# Patient Record
Sex: Male | Born: 1955 | Race: White | Hispanic: No | Marital: Married | Smoking: Former smoker
Health system: Southern US, Community
[De-identification: ages and names within clinical notes are randomized; demographics above are authoritative.]

## PROBLEM LIST (undated history)

## (undated) DIAGNOSIS — Z9861 Coronary angioplasty status: Secondary | ICD-10-CM

## (undated) DIAGNOSIS — K429 Umbilical hernia without obstruction or gangrene: Secondary | ICD-10-CM

## (undated) DIAGNOSIS — I10 Essential (primary) hypertension: Secondary | ICD-10-CM

## (undated) DIAGNOSIS — F1010 Alcohol abuse, uncomplicated: Secondary | ICD-10-CM

## (undated) DIAGNOSIS — I255 Ischemic cardiomyopathy: Secondary | ICD-10-CM

## (undated) DIAGNOSIS — Z923 Personal history of irradiation: Secondary | ICD-10-CM

## (undated) DIAGNOSIS — E669 Obesity, unspecified: Secondary | ICD-10-CM

## (undated) DIAGNOSIS — J449 Chronic obstructive pulmonary disease, unspecified: Secondary | ICD-10-CM

## (undated) DIAGNOSIS — C029 Malignant neoplasm of tongue, unspecified: Secondary | ICD-10-CM

## (undated) DIAGNOSIS — Z720 Tobacco use: Secondary | ICD-10-CM

## (undated) DIAGNOSIS — F419 Anxiety disorder, unspecified: Secondary | ICD-10-CM

## (undated) DIAGNOSIS — G8929 Other chronic pain: Secondary | ICD-10-CM

## (undated) DIAGNOSIS — I2102 ST elevation (STEMI) myocardial infarction involving left anterior descending coronary artery: Secondary | ICD-10-CM

## (undated) DIAGNOSIS — E785 Hyperlipidemia, unspecified: Secondary | ICD-10-CM

## (undated) DIAGNOSIS — C3490 Malignant neoplasm of unspecified part of unspecified bronchus or lung: Secondary | ICD-10-CM

## (undated) DIAGNOSIS — R51 Headache: Secondary | ICD-10-CM

## (undated) DIAGNOSIS — I2510 Atherosclerotic heart disease of native coronary artery without angina pectoris: Secondary | ICD-10-CM

## (undated) DIAGNOSIS — R5383 Other fatigue: Secondary | ICD-10-CM

## (undated) DIAGNOSIS — F32A Depression, unspecified: Secondary | ICD-10-CM

## (undated) DIAGNOSIS — J90 Pleural effusion, not elsewhere classified: Secondary | ICD-10-CM

## (undated) DIAGNOSIS — K219 Gastro-esophageal reflux disease without esophagitis: Secondary | ICD-10-CM

## (undated) DIAGNOSIS — F329 Major depressive disorder, single episode, unspecified: Secondary | ICD-10-CM

## (undated) HISTORY — DX: Essential (primary) hypertension: I10

## (undated) HISTORY — PX: LEFT HEART CATH AND CORONARY ANGIOGRAPHY: CATH118249

## (undated) HISTORY — DX: Alcohol abuse, uncomplicated: F10.10

## (undated) HISTORY — DX: Atherosclerotic heart disease of native coronary artery without angina pectoris: I25.10

## (undated) HISTORY — DX: Ischemic cardiomyopathy: I25.5

## (undated) HISTORY — DX: Hyperlipidemia, unspecified: E78.5

## (undated) HISTORY — DX: Gastro-esophageal reflux disease without esophagitis: K21.9

## (undated) HISTORY — PX: CORONARY ANGIOPLASTY WITH STENT PLACEMENT: SHX49

## (undated) HISTORY — DX: ST elevation (STEMI) myocardial infarction involving left anterior descending coronary artery: I21.02

## (undated) HISTORY — DX: Other fatigue: R53.83

## (undated) HISTORY — DX: Major depressive disorder, single episode, unspecified: F32.9

## (undated) HISTORY — DX: Chronic obstructive pulmonary disease, unspecified: J44.9

## (undated) HISTORY — PX: FRACTURE SURGERY: SHX138

## (undated) HISTORY — DX: Coronary angioplasty status: Z98.61

## (undated) HISTORY — DX: Depression, unspecified: F32.A

## (undated) HISTORY — DX: Obesity, unspecified: E66.9

---

## 2004-05-13 ENCOUNTER — Inpatient Hospital Stay (HOSPITAL_COMMUNITY): Admission: EM | Admit: 2004-05-13 | Discharge: 2004-05-15 | Payer: Self-pay | Admitting: Emergency Medicine

## 2004-05-13 ENCOUNTER — Encounter (INDEPENDENT_AMBULATORY_CARE_PROVIDER_SITE_OTHER): Payer: Self-pay | Admitting: Cardiovascular Disease

## 2006-01-02 ENCOUNTER — Ambulatory Visit: Payer: Self-pay | Admitting: Psychiatry

## 2006-01-02 ENCOUNTER — Inpatient Hospital Stay (HOSPITAL_COMMUNITY): Admission: RE | Admit: 2006-01-02 | Discharge: 2006-01-07 | Payer: Self-pay | Admitting: Psychiatry

## 2006-05-05 DIAGNOSIS — I2510 Atherosclerotic heart disease of native coronary artery without angina pectoris: Secondary | ICD-10-CM

## 2006-05-05 HISTORY — DX: Atherosclerotic heart disease of native coronary artery without angina pectoris: I25.10

## 2006-09-21 ENCOUNTER — Inpatient Hospital Stay (HOSPITAL_COMMUNITY): Admission: EM | Admit: 2006-09-21 | Discharge: 2006-09-24 | Payer: Self-pay | Admitting: Emergency Medicine

## 2006-10-19 ENCOUNTER — Ambulatory Visit (HOSPITAL_BASED_OUTPATIENT_CLINIC_OR_DEPARTMENT_OTHER): Admission: RE | Admit: 2006-10-19 | Discharge: 2006-10-19 | Payer: Self-pay | Admitting: Cardiology

## 2006-10-25 ENCOUNTER — Ambulatory Visit: Payer: Self-pay | Admitting: Internal Medicine

## 2007-06-11 ENCOUNTER — Inpatient Hospital Stay (HOSPITAL_COMMUNITY): Admission: EM | Admit: 2007-06-11 | Discharge: 2007-06-15 | Payer: Self-pay | Admitting: Cardiology

## 2007-09-02 ENCOUNTER — Encounter: Payer: Self-pay | Admitting: Cardiology

## 2007-09-13 ENCOUNTER — Encounter: Payer: Self-pay | Admitting: Cardiology

## 2009-09-19 ENCOUNTER — Telehealth (INDEPENDENT_AMBULATORY_CARE_PROVIDER_SITE_OTHER): Payer: Self-pay | Admitting: *Deleted

## 2009-09-26 ENCOUNTER — Ambulatory Visit: Payer: Self-pay | Admitting: Cardiology

## 2009-09-26 DIAGNOSIS — F17210 Nicotine dependence, cigarettes, uncomplicated: Secondary | ICD-10-CM

## 2009-09-26 DIAGNOSIS — E785 Hyperlipidemia, unspecified: Secondary | ICD-10-CM

## 2009-09-26 DIAGNOSIS — I2510 Atherosclerotic heart disease of native coronary artery without angina pectoris: Secondary | ICD-10-CM | POA: Insufficient documentation

## 2009-09-26 DIAGNOSIS — Z9861 Coronary angioplasty status: Secondary | ICD-10-CM

## 2009-09-26 DIAGNOSIS — R079 Chest pain, unspecified: Secondary | ICD-10-CM

## 2009-10-17 ENCOUNTER — Telehealth (INDEPENDENT_AMBULATORY_CARE_PROVIDER_SITE_OTHER): Payer: Self-pay | Admitting: *Deleted

## 2009-10-18 ENCOUNTER — Encounter: Payer: Self-pay | Admitting: Cardiovascular Disease

## 2009-10-18 ENCOUNTER — Ambulatory Visit (HOSPITAL_COMMUNITY): Admission: RE | Admit: 2009-10-18 | Discharge: 2009-10-18 | Payer: Self-pay | Admitting: Cardiology

## 2009-10-18 ENCOUNTER — Ambulatory Visit: Payer: Self-pay | Admitting: Cardiovascular Disease

## 2009-10-18 ENCOUNTER — Ambulatory Visit: Payer: Self-pay

## 2009-10-18 ENCOUNTER — Ambulatory Visit: Payer: Self-pay | Admitting: Cardiology

## 2009-10-18 ENCOUNTER — Encounter (HOSPITAL_COMMUNITY): Admission: RE | Admit: 2009-10-18 | Discharge: 2009-10-18 | Payer: Self-pay | Admitting: Cardiology

## 2009-10-19 LAB — CONVERTED CEMR LAB
Albumin: 4.3 g/dL (ref 3.5–5.2)
Bilirubin, Direct: 0.2 mg/dL (ref 0.0–0.3)
Calcium: 9.3 mg/dL (ref 8.4–10.5)
Chloride: 103 meq/L (ref 96–112)
Cholesterol: 217 mg/dL — ABNORMAL HIGH (ref 0–200)
GFR calc non Af Amer: 98.66 mL/min (ref >60)
Glucose, Bld: 92 mg/dL (ref 70–99)
HDL: 50.1 mg/dL (ref >39.00)
Potassium: 4.9 meq/L (ref 3.5–5.1)
Total CHOL/HDL Ratio: 4
Total Protein: 7.2 g/dL (ref 6.0–8.3)
Triglycerides: 77 mg/dL (ref 0.0–149.0)

## 2009-10-23 ENCOUNTER — Encounter (INDEPENDENT_AMBULATORY_CARE_PROVIDER_SITE_OTHER): Payer: Self-pay | Admitting: *Deleted

## 2009-11-06 ENCOUNTER — Ambulatory Visit: Payer: Self-pay | Admitting: Cardiology

## 2009-11-06 DIAGNOSIS — I255 Ischemic cardiomyopathy: Secondary | ICD-10-CM

## 2009-11-07 ENCOUNTER — Ambulatory Visit (HOSPITAL_COMMUNITY): Admission: RE | Admit: 2009-11-07 | Discharge: 2009-11-07 | Payer: Self-pay | Admitting: Cardiology

## 2009-11-07 ENCOUNTER — Ambulatory Visit: Payer: Self-pay | Admitting: Cardiology

## 2009-11-08 LAB — CONVERTED CEMR LAB
BUN: 13 mg/dL
CO2: 25 meq/L
Calcium: 9.2 mg/dL
Chloride: 106 meq/L
Creatinine, Ser: 1 mg/dL
GFR calc non Af Amer: 87.93 mL/min
Glucose, Bld: 63 mg/dL — ABNORMAL LOW
Potassium: 4.2 meq/L
Pro B Natriuretic peptide (BNP): 74.1 pg/mL
Sodium: 139 meq/L

## 2009-11-13 ENCOUNTER — Telehealth: Payer: Self-pay | Admitting: Cardiology

## 2009-11-26 ENCOUNTER — Ambulatory Visit: Payer: Self-pay | Admitting: Cardiology

## 2009-11-30 LAB — CONVERTED CEMR LAB
AST: 22 units/L (ref 0–37)
Albumin: 4.2 g/dL (ref 3.5–5.2)
Alkaline Phosphatase: 93 units/L (ref 39–117)
BUN: 15 mg/dL (ref 6–23)
CO2: 28 meq/L (ref 19–32)
Calcium: 9.2 mg/dL (ref 8.4–10.5)
Chloride: 100 meq/L (ref 96–112)
Cholesterol: 146 mg/dL (ref 0–200)
Creatinine, Ser: 1 mg/dL (ref 0.4–1.5)
Glucose, Bld: 96 mg/dL (ref 70–99)
Potassium: 4.4 meq/L (ref 3.5–5.1)
Sodium: 135 meq/L (ref 135–145)
Total Bilirubin: 0.6 mg/dL (ref 0.3–1.2)
Total CHOL/HDL Ratio: 3
Total Protein: 7.5 g/dL (ref 6.0–8.3)

## 2010-03-06 ENCOUNTER — Encounter: Payer: Self-pay | Admitting: Cardiology

## 2010-03-06 ENCOUNTER — Ambulatory Visit: Payer: Self-pay | Admitting: Cardiology

## 2010-03-22 ENCOUNTER — Ambulatory Visit: Payer: Self-pay | Admitting: Cardiology

## 2010-05-08 ENCOUNTER — Other Ambulatory Visit: Payer: Self-pay | Admitting: Cardiology

## 2010-05-08 ENCOUNTER — Ambulatory Visit
Admission: RE | Admit: 2010-05-08 | Discharge: 2010-05-08 | Payer: Self-pay | Source: Home / Self Care | Attending: Cardiology | Admitting: Cardiology

## 2010-05-08 LAB — HEPATIC FUNCTION PANEL
ALT: 17 U/L (ref 0–53)
AST: 19 U/L (ref 0–37)
Albumin: 3.6 g/dL (ref 3.5–5.2)
Alkaline Phosphatase: 96 U/L (ref 39–117)
Bilirubin, Direct: 0.2 mg/dL (ref 0.0–0.3)
Total Bilirubin: 0.6 mg/dL (ref 0.3–1.2)
Total Protein: 7 g/dL (ref 6.0–8.3)

## 2010-05-08 LAB — LIPID PANEL
Cholesterol: 150 mg/dL (ref 0–200)
HDL: 44.8 mg/dL (ref >39.00)
LDL Cholesterol: 95 mg/dL (ref 0–99)
Total CHOL/HDL Ratio: 3
Triglycerides: 52 mg/dL (ref 0.0–149.0)
VLDL: 10.4 mg/dL (ref 0.0–40.0)

## 2010-06-04 NOTE — Letter (Signed)
Summary: Appointment - Cardiac MRI  [facility]     [place], [place]    Phone:   Fax:       October 23, 2009 MRN: [id number]   Trevor Griffin [address] [place], [place]  [address]   Dear Trevor Griffin,   We have scheduled the above patient for an appointment for a Cardiac MRI on 11-07-2009 at 1:00p.m.  Please refer to the below information for the location and instructions for this test:  Location:     [facility]       [address]       [place], [place]  [address] Instructions:    Wilmon Arms at [facility] Outpatient Registration 45 minutes prior to your appointment time.  This will ensure you are in the Radiology Department 30 minutes prior to your appointment.    There are no restrictions for this test you may eat and take medications as usual.  If you need to reschedule this appointment please call at the number listed above.   Sincerely,      Lorne Skeens  [facility] Scheduling Team

## 2010-06-04 NOTE — Progress Notes (Signed)
  Phone Note Other Incoming   Request: Send information Summary of Call: Records received from [facility]. Called ext [phone] but no answer. Records forwarded to [facility] for Dr. Shirlee Latch to review.

## 2010-06-04 NOTE — Letter (Signed)
Summary: [facility] Office Note  [facility] Office Note   Imported By: Roderic Ovens 10/12/2009 11:39:37  _____________________________________________________________________  External Attachment:    Type:   Image     Comment:   External Document

## 2010-06-04 NOTE — Assessment & Plan Note (Signed)
Summary: lipid/liver/414.01 272.4   Visit Type:  Follow-up Primary Provider:  None  CC:  Patient never fill Lisinopril prescription.  History of Present Illness: 55 yo with history of CAD s/p anterior STEMI in 5/08 with placement of 4 Taxus stents in the proximal LAD.  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  No further hospitalizations since that time.  He is working full time at [employer], doing a lot of lifting and walking.  He feels like he has been getting tired/fatigued more easily over the last few months.  He does not get short of breath but he does get exhausted more easily than in the past.  He has had occasional episodes of atypical chest pain.  At last appointment, I restarted Plavix and put him on bisoprolol and Lipitor.  He was supposed to start lisinopril but did not.  He has been cutting back on cigarettes but is still smoking.   Myoview showed a large anteroapical infarct with no ischemia.  EF was 37%.  Echo showed EF 40% with septal/apical akinesis and inferior hypokinesis.    Labs (6/11): LDL 148  Current Medications (verified): 1)  Bisoprolol Fumarate 5 Mg Tabs (Bisoprolol Fumarate) .... One Tablet Daily 2)  Plavix 75 Mg Tabs (Clopidogrel Bisulfate) .... Once Daily 3)  Aspirin 81 Mg Tabs (Aspirin) .... Once Daily 4)  Lipitor 40 Mg Tabs (Atorvastatin Calcium) .... One Tablet Daily 5)  Celexa 20 Mg Tabs (Citalopram Hydrobromide) .... One Tablet Daily 6)  Nitrostat 0.4 Mg Subl (Nitroglycerin) .... One Under Your Tongue As Needed For Chest Pain  Allergies (verified): No Known Drug Allergies  Past History:  Past Medical History: 1. Hypertension 2. Gastroesophageal reflux disease 3. COPD: Active smoker, 1-1.5 ppd 4. CAD: Anterior STEMI 5/08 with 4 overlapping Taxus stents in the proximal to mid LAD.  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  Cath in  2/09 also showed 80% mid to distal CFX stenosis.  Myoview (6/11): EF 37%, large anteroapical infarct with no ischemia.  5. LV systolic dysfunction: Echo (4/09) with EF 40-50%, Mild global hypokinesis, mild LVH.  Echo (6/11): EF 40%, septal/apical akinesis, inferior hypokinesis, mild LVH.  6. Sleep study 6/08 was negative.   Family History: Reviewed history from 09/26/2009 and no changes required. CAD  Social History: Reviewed history from 09/26/2009 and no changes required. Married, lives in [place].  Works full time at [employer].  Drinks about a 6-pack daily.  Smokes 1-1.5 ppd. No kids.   Review of Systems       All systems reviewed and negative except as per HPI.   Vital Signs:  Patient profile:   55 year old male Height:      68.5 inches Weight:      181.25 pounds BMI:     27.26 Pulse rate:   68 / minute Pulse rhythm:   regular Resp:     18 per minute BP sitting:   106 / 70  (left arm) Cuff size:   large  Vitals Entered By: Vikki Ports (November 06, 2009 2:46 PM)  Physical Exam  General:  Well developed, well nourished, in no acute distress. Neck:  Neck supple, JVP 7-8 cm. No masses, thyromegaly or abnormal cervical nodes. Lungs:  Distant heart sounds.  Heart:  Non-displaced PMI, chest non-tender; somewhat distant heart sounds with regular rate and rhythm, S1, S2 without  rubs, murmurs, gallops. Carotid upstroke normal, no bruit. Pedals normal pulses. No edema, no varicosities. Abdomen:  Bowel sounds positive; abdomen soft and non-tender without masses, organomegaly, or hernias noted. No hepatosplenomegaly. Extremities:  No clubbing or cyanosis. Neurologic:  Alert and oriented x 3. Psych:  Normal affect.   Impression & Recommendations:  Problem # 1:  CORONARY ATHEROSCLEROSIS NATIVE CORONARY ARTERY (ICD-414.01) Infarct with no ischemia on myoview.  Suspect that his atypical chest pain was noncardiac.  Continue bisoprolol, ASA, Plavix, statin.  Will continue Plavix  long-term given late stent thrombosis when off Plavix.    Problem # 2:  CARDIOMYOPATHY, ISCHEMIC (ICD-414.8) EF 37% by myoview and 40% by echo.  No significant volume overload and no significant dyspnea (symptoms limited to generalized fatigue).  Continue bisoprolol and start lisinopril 2.5 mg daily (did not start at last appointment) with BMET in 2 wks.  I am going to have him do a cardiac MRI to quantify EF since it is getting close to 35%, at which level I would recommend an ICD.     Problem # 3:  SMOKER (ICD-305.1) Counselled to quit.  Wants to try electronic cigarette.   Problem # 4:  HYPERCHOLESTEROLEMIA (ICD-272.0) Lipids/LFTs in 8/11.   Other Orders: TLB-BMP (Basic Metabolic Panel-BMET) (80048-METABOL) TLB-BNP (B-Natriuretic Peptide) (83880-BNPR)  Patient Instructions: 1)  Your physician has recommended you make the following change in your medication:  2)  Start  Lisinopril 2.5mg  daily 3)  Use electronic cigarrette to help you stop smoking. 4)  Lab today---BMP/BNP 414.01 5)  Lab in 2 weeks--BMP 414.01 Wednesday July 20. 6)  Your physician recommends that you schedule a follow-up appointment in: 3 months with Dr Shirlee Latch.  7)  Keep the appointment for the Cardiac MRI tomorrow.

## 2010-06-04 NOTE — Progress Notes (Signed)
Summary: Nuclear Pre-Procedure  Phone Note Outgoing Call Call back at [facility] Phone [phone]   Call placed by: Stanton Kidney, EMT-P,  October 17, 2009 2:56 PM Call placed to: Patient Action Taken: Phone Call Completed Summary of Call: Reviewed information on Myoview Information Sheet (see scanned document for further details).  Spoke with Patient.    Nuclear Med Background Indications for Stress Test: Evaluation for Ischemia, Stent Patency, PTCA Patency   History: Angioplasty, COPD, Echo, Heart Catheterization, Myocardial Infarction, Stents  History Comments: '08 Stents: LAD x2 '08 & '09 MI '09 Heart Cath: LAD Stent thrombosis, residual CFX Dz > Angioplasty-LAD '09 Echo: EF=40-50%  Symptoms: Chest Pain, Fatigue    Nuclear Pre-Procedure Cardiac Risk Factors: Lipids, Smoker Height (in): 68.5

## 2010-06-04 NOTE — Progress Notes (Signed)
Summary: b/p issueson 2 different meds  Phone Note Call from Patient Call back at Home Phone [phone]   Caller: Patient Reason for Call: Talk to Nurse Summary of Call: per pt calling. c/o blood pressure issues 96/59. on yesterday 87/59. also weakness, pt on 2 different b/p meds.  Initial call taken by: Lorne Skeens,  November 13, 2009 2:38 PM  Follow-up for Phone Call        [facility] Katina Dung, RN, BSN  November 13, 2009 3:13 PM   per pt calling back  Lorne Skeens  November 13, 2009 3:22 PM     New/Updated Medications: BISOPROLOL FUMARATE 5 MG TABS (BISOPROLOL FUMARATE) one tablet daily in the morning LISINOPRIL 2.5 MG TABS (LISINOPRIL) one daily in the evening   Current Medications (verified): 1)  Bisoprolol Fumarate 5 Mg Tabs (Bisoprolol Fumarate) .... One Tablet Daily in The Morning 2)  Plavix 75 Mg Tabs (Clopidogrel Bisulfate) .... Once Daily 3)  Aspirin 81 Mg Tabs (Aspirin) .... Once Daily 4)  Lipitor 40 Mg Tabs (Atorvastatin Calcium) .... One Tablet Daily 5)  Celexa 20 Mg Tabs (Citalopram Hydrobromide) .... One Tablet Daily 6)  Nitrostat 0.4 Mg Subl (Nitroglycerin) .... One Under Your Tongue As Needed For Chest Pain 7)  Lisinopril 2.5 Mg Tabs (Lisinopril) .... One Daily in The Evening  Allergies: No Known Drug Allergies pt states he is taking Lisinopril 2.5mg  and Bisoprolol 5mg  daily in the morning--his blood pressure has been 124/74 in the morning but his B/P drops to 90's/59-60 later in the day--reviewed with Dr Jacolyn Joaquin--pt will take Bisoprolol in the morning and Lisinopril in the evening--pt will continue to monitor B/P and call if it continues in the 90's/59-60 range

## 2010-06-04 NOTE — Assessment & Plan Note (Signed)
Summary: np6/previous pt of Dr Gamble/jml   Primary Provider:  None  CC:  new patient/patient of Dr. Reino Kent.  .  History of Present Illness: 55 yo with history of CAD s/p anterior STEMI in 5/08 with placement of 4 Taxus stents in the proximal LAD.  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  No further hospitalizations since that time.  EF in 4/09 was mildly decreased (40-50% by echo).  Patient continues to smoke relatively heavily.  Chantix has worked to help him stop in the past.  He is working full time at [employer], doing a lot of lifting and walking.  He feels like he has been getting tired/fatigued more easily over the last few months.  He does not get short of breath but he does get exhausted more easily than in the past.  He has occasional episodes of chest pain.  Last episode was about 5 days ago at work.  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  He had to stop and rest and the pain went away.  He has not had any since then.  He is out of most of his medications and is sharing his wife's Plavix.  He has not taken metoprolol, lisinopril, his statin, or Lexapro for a few weeks now.    ECG: NSR, LAE, old ASMI  Current Medications (verified): 1)  Lopressor 50 Mg Tabs (Metoprolol Tartrate) .... 1/2 Two Times A Day 2)  Plavix 75 Mg Tabs (Clopidogrel Bisulfate) .... Once Daily 3)  Aspirin 81 Mg Tabs (Aspirin) .... Once Daily  Allergies (verified): No Known Drug Allergies  Past History:  Past Medical History: 1. Hypertension 2. Gastroesophageal reflux disease 3. COPD: Active smoker, 1-1.5 ppd 4. CAD: Anterior STEMI 5/08 with 4 overlapping Taxus stents in the proximal to mid LAD.  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  Cath in 2/09 also showed 80% mid to distal CFX  stenosis.  5. LV systolic dysfunction: Echo (4/09) with EF 40-50%, Mild global hypokinesis, mild LVH 6. Sleep study 6/08 was negative.   Family History: CAD  Social History: Married, lives in [place].  Works full time at [employer].  Drinks about a 6-pack daily.  Smokes 1-1.5 ppd. No kids.   Review of Systems       All systems reviewed and negative except as per HPI.   Vital Signs:  Patient profile:   55 year old male Height:      68.5 inches Weight:      183 pounds BMI:     27.52 Pulse rate:   71 / minute Pulse rhythm:   regular BP sitting:   128 / 80  (left arm) Cuff size:   regular  Vitals Entered By: Judithe Modest CMA (Sep 26, 2009 11:01 AM)  Physical Exam  General:  Well developed, well nourished, in no acute distress. Head:  normocephalic and atraumatic Nose:  no deformity, discharge, inflammation, or lesions Mouth:  Teeth, gums and palate normal. Oral mucosa normal. Neck:  Neck supple, no JVD. No masses, thyromegaly or abnormal cervical nodes. Lungs:  Distant breath sounds bilaterally.  Heart:  Non-displaced PMI, chest non-tender; somewhat distant heart sounds with regular rate and rhythm, S1, S2 without rubs or gallops. 1/6 HSM at apex.  Carotid upstroke normal, no  bruit. Pedals normal pulses. No edema, no varicosities. Abdomen:  Bowel sounds positive; abdomen soft and non-tender without masses, organomegaly, or hernias noted. No hepatosplenomegaly. Msk:  Back normal, normal gait. Muscle strength and tone normal. Extremities:  No clubbing or cyanosis. Neurologic:  Alert and oriented x 3. Skin:  Multiple tatoos Psych:  Normal affect.   Impression & Recommendations:  Problem # 1:  CORONARY ATHEROSCLEROSIS NATIVE CORONARY ARTERY (ICD-414.01) Patient has had history of CAD s/p anterior MI x 2, the second was stent thrombosis when he stopped Plavix (over a year after his stents were put in ).  He has not been taking his meds regularly except for ASA 81 mg daily.   He is noting very easy fatiguability at work, and he had an episode of exertional chest pain about 5 days ago.   - Given his history, I think that he needs ETT-myoview to assess for significant ischemia.  - Restart beta blocker: bisoprolol 5 mg daily rather than metoprolol.  - Restart low dose ACEI - Restart Plavix, will look into getting him into the Plavix assistance program.  - Restart statin: Lipitor 40 mg daily   Problem # 2:  HYPERCHOLESTEROLEMIA (ICD-272.0) Will check lipids/LFTs today.  Will restart statin (Lipitor 40 mg daily) with lipids/LFTs in 2 months.   Problem # 3:  ISCHEMIC CARDIOMYOPATHY Mildly decreased LV systolic function on 2009 echo.  No significant volume overload on exam.  Will start bisoprolol 5 mg daily and lisinopril 2.5 mg daily. Will get echocardiogram.   Problem # 4:  DEPRESSION I will restart the patient on an antidepressant, Celexa 20 mg daily.   Problem # 5:  SMOKER (ICD-305.1) Patient needs to quit.  I strongly advised him to do so.  I will give him a prescription for Chantix (worked in the past).  If he cannot afford this, will also give prescription for electronic cigarette.  I referred the patient to primary care.   Other Orders: Primary Care Referral (Primary) Echocardiogram (Echo) Nuclear Stress Test (Nuc Stress Test)  Patient Instructions: 1)  Your physician has recommended you make the following change in your medication:  2)    3)  Stop Metoprolol(lopressor) 4)  Start Bisoprolol 5mg  daily 5)  Take Aspirin 81mg  daily--this should be buffered or coated 6)  Stop Simvastatin 7)  Start Lipitor 40mg  daily 8)  Start Lisinopril 2.5mg  daily 9)  Take Plavix 75mg  daily 10)  Start Celexa 20mg  daily 11)  Start Chantix or use the electronic cigarrette to help you stop smoking---YOU SHOULD NOT USE BOTH 12)  Your physician recommends that you return for a FASTING lipid profile/liver profile/BMP---- ASAP AND in 2 months--414.01 272.0 786.50-you can have  this when you get other testing done 13)  Your physician has requested that you have an echocardiogram.  Echocardiography is a painless test that uses sound waves to create images of your heart. It provides your doctor with information about the size and shape of your heart and how well your heart's chambers and valves are working.  This procedure takes approximately one hour. There are no restrictions for this procedure. 14)  Your physician has requested that you have an exercise stress myoview.  For further information please visit [email or web address]  Please follow instruction sheet, as given. 15)  Your physician recommends that you schedule a follow-up appointment in: 3 weeks with Dr Shirlee Latch. 16)  You have been referred to Primary Care on [facility] 17)  Your physician recommended you take 1 tablet under  tongue at onset of chest pain; you may repeat every 5 minutes for up to 3 doses. If 3 or more doses are required, call 911 and proceed to the ER immediately. Prescriptions: CHANTIX CONTINUING MONTH PAK 1 MG TABS (VARENICLINE TARTRATE) take as directed  #1 x 1   Entered by:   Katina Dung, RN, BSN   Authorized by:   [name], MD   Signed by:   Katina Dung, RN, BSN on 09/26/2009   Method used:   Electronically to        [facility]  [address] [id number] (retail)       [address]       [place], [place]  [address]       Ph: [phone] or [phone]       Fax: [phone]   RxID:   [id number] CHANTIX STARTING MONTH PAK 0.5 MG X 11 & 1 MG X 42 TABS (VARENICLINE TARTRATE) take as directed  #1 x 0   Entered by:   Katina Dung, RN, BSN   Authorized by:   [name], MD   Signed by:   Katina Dung, RN, BSN on 09/26/2009   Method used:   Electronically to        [facility]  [address] [id number] (retail)       [address]       [place], [place]  [address]       Ph: [phone] or [phone]       Fax: [phone]   RxID:    [id number] NITROSTAT 0.4 MG SUBL (NITROGLYCERIN) one under your tongue as needed for chest pain  #100 x 11   Entered by:   Katina Dung, RN, BSN   Authorized by:   [name], MD   Signed by:   Katina Dung, RN, BSN on 09/26/2009   Method used:   Electronically to        [facility]  [address] [id number] ([facility])       [address]       [place], [place]  [address]       Ph: [phone] or [phone]       Fax: [phone]   RxID:   [id number] CELEXA 20 MG TABS (CITALOPRAM HYDROBROMIDE) one tablet daily  #30 x 1   Entered by:   Katina Dung, RN, BSN   Authorized by:   [name], MD   Signed by:   Katina Dung, RN, BSN on 09/26/2009   Method used:   Electronically to        [facility]  [address] [id number] ([facility])       [address]       [place], [place]  [address]       Ph: [phone] or [phone]       Fax: [phone]   RxID:   [id number] LISINOPRIL 2.5 MG TABS (LISINOPRIL) one tablet daily  #30 x 11   Entered by:   Katina Dung, RN, BSN   Authorized by:   [name], MD   Signed by:   Katina Dung, RN, BSN on 09/26/2009   Method used:   Electronically to        [facility]  [address] [facility]       [address]       [facility]  [place], [place]  [address]       Ph: [phone] or [phone]       Fax: [phone]   RxID:   [id number] BISOPROLOL FUMARATE 5 MG TABS (BISOPROLOL FUMARATE) one tablet daily  #30 x 11   Entered by:   Katina Dung, RN, BSN   Authorized by:   [name], MD   Signed by:   Katina Dung, RN, BSN on 09/26/2009   Method used:   Electronically to        [facility]  [address] [id number] ([facility])       [address]       [place], [place]  [address]       Ph: [phone] or [phone]       Fax: [phone]   RxID:   [id number] LIPITOR 40 MG TABS (ATORVASTATIN CALCIUM) one daily  #30 x 2   Entered by:   Katina Dung, RN, BSN   Authorized by:    [name], MD   Signed by:   Katina Dung, RN, BSN on 09/26/2009   Method used:   Electronically to        [facility]  [address] [facility] ([facility])       [address]       [place], [place]  [address]       Ph: [phone] or [phone]       Fax: [phone]   RxID:   [id number] PLAVIX 75 MG TABS (CLOPIDOGREL BISULFATE) once daily  #30 x 11   Entered by:   Katina Dung, RN, BSN   Authorized by:   [name], MD   Signed by:   Katina Dung, RN, BSN on 09/26/2009   Method used:   Electronically to        [facility]  [address] [facility] ([facility])       [address]       [place], [place]  [address]       Ph: [phone] or [phone]       Fax: [phone]   RxID:   [id number]

## 2010-06-04 NOTE — Assessment & Plan Note (Signed)
Summary: 3 month rov.sl   Visit Type:  Follow-up Primary Provider:  None  CC:  no complaints.  History of Present Illness: 55 yo with history of CAD s/p anterior STEMI in 5/08 with placement of 4 Taxus stents in the proximal LAD.  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  EF was borderline for ICD by echo and myoview, so cardiac MRI was done to quantify LV function.  EF was found to be 39%.  He is working full time at [employer], doing a lot of lifting and walking.   He is doing well in general, no chest pain and no dyspnea with exertion.  BP at home is usually running in the 120s/80s.  However, at times he will feel fatigued and check his BP, and systolic can be down to the 80s and 90s.  Normally he does not have significant fatigue.  No myalgias.  He has been on simvastatin 80 mg for well over a year.    Labs (6/11): LDL 148 Labs (7/11): HDL 47, LDL 73, K 4.4, creatinine 1.0, BNP 74  ECG: NSR, old ASMI  Current Medications (verified): 1)  Bisoprolol Fumarate 5 Mg Tabs (Bisoprolol Fumarate) .... One Tablet Daily in The Morning 2)  Plavix 75 Mg Tabs (Clopidogrel Bisulfate) .... Once Daily 3)  Aspirin 81 Mg Tabs (Aspirin) .... Once Daily 4)  Simvastatin 80 Mg Tabs (Simvastatin) .Marland Kitchen.. 1 Tab At Bedtime 5)  Celexa 20 Mg Tabs (Citalopram Hydrobromide) .... One Tablet Daily 6)  Nitrostat 0.4 Mg Subl (Nitroglycerin) .... One Under Your Tongue As Needed For Chest Pain 7)  Lisinopril 2.5 Mg Tabs (Lisinopril) .... One Daily in The Evening  Allergies (verified): No Known Drug Allergies  Past History:  Past Medical History: 1. Hypertension 2. Gastroesophageal reflux disease 3. COPD: Active smoker, 1-1.5 ppd 4. CAD: Anterior STEMI 5/08 with 4 overlapping Taxus stents in the proximal to mid LAD.  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  Cath in 2/09 also showed 80% mid to distal  CFX stenosis.  Myoview (6/11): EF 37%, large anteroapical infarct with no ischemia.  5. LV systolic dysfunction: Echo (4/09) with EF 40-50%, Mild global hypokinesis, mild LVH.  Echo (6/11): EF 40%, septal/apical akinesis, inferior hypokinesis, mild LVH.   Cardiac MRI (7/11): EF 39%, anteroseptal and mid to apical anterior akinesis, scar in the anteroseptal and the mid to apical anterior wall (likely not viable).  6. Sleep study 6/08 was negative.  7. Hyperlipidemia  Family History: Reviewed history from 09/26/2009 and no changes required. CAD  Social History: Reviewed history from 09/26/2009 and no changes required. Married, lives in [place].  Works full time at [employer].  Drinks about a 6-pack daily.  Smokes 1-1.5 ppd. No kids.   Review of Systems       All systems reviewed and negative except as per HPI.   Vital Signs:  Patient profile:   55 year old male Height:      68.5 inches Weight:      183 pounds BMI:     27.52 Pulse rate:   73 / minute BP sitting:   148 / 88  (left arm) Cuff size:   regular  Vitals Entered By: Hardin Negus, RMA (March 06, 2010 8:55 AM)  Physical Exam  General:  Well developed, well nourished, in no acute distress. Neck:  Neck supple, JVP  7 cm. No masses, thyromegaly or abnormal cervical nodes. Lungs:  Mildly decreased breath sounds bilaterally.  Heart:  Non-displaced PMI, chest non-tender; somewhat distant heart sounds with regular rate and rhythm, S1, S2 without rubs, murmurs, gallops. Carotid upstroke normal, no bruit. Pedals normal pulses. No edema, no varicosities. Abdomen:  Bowel sounds positive; abdomen soft and non-tender without masses, organomegaly, or hernias noted. No hepatosplenomegaly. Extremities:  No clubbing or cyanosis. Neurologic:  Alert and oriented x 3. Psych:  Normal affect.   Impression & Recommendations:  Problem # 1:  CARDIOMYOPATHY, ISCHEMIC (ICD-414.8) EF 37% by myoview, 40% by echo, and 39% by MRI.  Will hold  off on ICD for now but will continue to follow function over time.  No significant volume overload and no significant dyspnea.  NYHA class II.  Continue bisoprolol and lisinopril 2.5 mg daily.  I am not going to increase either right now given his episodes of occasional symptomatic hypotension.  I will start him on spironolactone 12.5 mg daily with BMET in 2 wks.   Problem # 2:  SMOKER (ICD-305.1) Counselled to quit.  He is working on it.   Problem # 3:  CORONARY ATHEROSCLEROSIS NATIVE CORONARY ARTERY (ICD-414.01) Infarct with no ischemia on most recent myoview.  Cardiac MRI showed scar pattern in the anterior and anteroseptal walls that suggest viability is unlikely. Continue bisoprolol, ASA, Plavix, statin.  Will continue Plavix long-term given late stent thrombosis when off Plavix.    Problem # 4:  HYPERCHOLESTEROLEMIA (ICD-272.0) Patient has been on simvastatin 80 mg without problems, but as Lipitor is going generic I think this will be more safe.  He will start atorvastatin 40 mg daily when he runs out of simvastatin.   Patient Instructions: 1)  Your physician has recommended you make the following change in your medication:  2)  Start Spironolactone 12.5mg  daily--this will be one-half of a 25mg  tablet. 3)  Your physician recommends that you return for lab work in: 2 weeks---BMP  414.01  428.22 4)  Your physician recommends that you return for a FASTING lipid profile/liver profile in [place] 2012--414.01  428.22 5)  Your physician recommends that you schedule a follow-up appointment in: 4 months with Dr Shirlee Latch. Prescriptions: SPIRONOLACTONE 25 MG TABS (SPIRONOLACTONE) one-half daily  #45 x 3   Entered by:   Katina Dung, RN, BSN   Authorized by:   [name], MD   Signed by:   Katina Dung, RN, BSN on 03/06/2010   Method used:   Electronically to        [facility] [id number] ([facility])       [address]       [place], [place]  [address]       Ph:  [phone] or [phone]       Fax: [phone]   RxID:   [id number]

## 2010-06-04 NOTE — Assessment & Plan Note (Signed)
Summary: Cardiology Nuclear Study  Nuclear Med Background Indications for Stress Test: Evaluation for Ischemia, Stent Patency, PTCA Patency   History: Angioplasty, COPD, Echo, Heart Catheterization, Myocardial Infarction, Stents  History Comments: '08 Stents: LAD x2 '08 & '09 MI '09 Heart Cath: LAD Stent thrombosis, residual CFX Dz > Angioplasty-LAD '09 Echo: EF=40-50%  Symptoms: Chest Pain, DOE, Fatigue    Nuclear Pre-Procedure Cardiac Risk Factors: Lipids, Smoker Caffeine/Decaff Intake: None NPO After: 5:30 PM Lungs: clear IV 0.9% NS with Angio Cath: 18g     IV Site: (R) AC IV Started by: Stanton Kidney EMT-P Chest Size (in) 44     Height (in): 68.5 Weight (lb): 181 BMI: 27.22 Tech Comments: Bisoprolol held > 24 hours, per patient.  Nuclear Med Study 1 or 2 day study:  1 day     Stress Test Type:  Stress Reading MD:  Charlton Haws, MD     Referring MD:  Mclean Resting Radionuclide:  Technetium 36m Tetrofosmin     Resting Radionuclide Dose:  11 mCi  Stress Radionuclide:  Technetium 62m Tetrofosmin     Stress Radionuclide Dose:  33 mCi   Stress Protocol Exercise Time (min):  7:30 min     Max HR:  160 bpm Max Systolic BP: 186 mm Hg     METS: 9.30 Rate Pressure Product:  59563    Stress Test Technologist:  Frederick Peers EMT-P     Nuclear Technologist:  Domenic Polite CNMT  Rest Procedure  Myocardial perfusion imaging was performed at rest 45 minutes following the intravenous administration of Myoview Technetium 9m Tetrofosmin.  Stress Procedure  The patient exercised for 7:44mins.  The patient stopped due to SOB/fatigue  and denied any chest pain.  There were non specific ST-T wave changes.  Myoview was injected at peak exercise and myocardial perfusion imaging was performed after a brief delay.  QPS Raw Data Images:  Normal; no motion artifact; normal heart/lung ratio. Stress Images:  anteriro MI Rest Images:  anteriro MI Subtraction (SDS):  SDS 3 Transient  Ischemic Dilatation:  1.01  (Normal <1.22)  Lung/Heart Ratio:  .38  (Normal <0.45)  Quantitative Gated Spect Images QGS EDV:  150 ml QGS ESV:  94 ml QGS EF:  37 % QGS cine images:  Anteroapical hypokinesis  Findings Low risk nuclear study  Evidence for anterior (septal apical) infarct     Overall Impression  Exercise Capacity: Fair exercise capacity. BP Response: Normal blood pressure response. Clinical Symptoms: Dyspnea and Fatigue ECG Impression: No significant ST segment change suggestive of ischemia. Overall Impression: Large anteroapical infarct with no ishcemia.  ? further risk stratification regarding AICD  Appended Document: Cardiology Nuclear Study Old anterior MI, no ischemia.   Appended Document: Cardiology Nuclear Study discussed results with wife 10/19/09

## 2010-07-15 ENCOUNTER — Other Ambulatory Visit: Payer: Managed Care, Other (non HMO)

## 2010-07-17 ENCOUNTER — Ambulatory Visit: Payer: Self-pay | Admitting: Cardiology

## 2010-08-20 ENCOUNTER — Encounter: Payer: Self-pay | Admitting: Cardiology

## 2010-09-17 NOTE — Discharge Summary (Signed)
NAME:  Trevor Griffin, Trevor Griffin NO.:  [id number]   MEDICAL RECORD NO.:  [id number]          PATIENT TYPE:  INP   LOCATION:  2023                         FACILITY:  [facility]   PHYSICIAN:  Madaline Savage, M.D.DATE OF BIRTH:  [birth year]   DATE OF ADMISSION:  09/21/2006  DATE OF DISCHARGE:  09/24/2006                               DISCHARGE SUMMARY   DISCHARGE DIAGNOSIS:  1. Acute ST-elevation anterior myocardial infarction.      a.     Res-Q cardiac cath with percutaneous transluminal coronary       angioplasty and drug-eluting Taxus stent placement to a 100% left       anterior descending stenosis in the proximal and mid left anterior       descending overlapping Taxus stent.      b.     Previous known coronary disease with 40-50% proximal and mid       left anterior descending disease.  2. Ischemic cardiomyopathy with stunned myocardium, ejection fraction      30%.      a.     We will need a 2D echo in 3 months to evaluate for       implantable cardiac defibrillator.  3. Hyperlipidemia with Statins currently on hold secondary to next      number.  4. Abnormal liver function tests, Statin on hold, will monitor as an      outpatient.  5. Positive tobacco use, Chantix started.  6. Chronic obstructive pulmonary disease.   DISCHARGE MEDICATIONS:  1. Mobic if needed for arthritic pain.  2. Tylenol if needed for pain.  3. Lexapro 10 mg daily.  4. Omeprazole/Prilosec 20 mg daily.  5. Lisinopril 5 mg 1 daily.  6. Lopressor 50 mg 1/2 tablet every 8 hours.  7. Plavix 75 mg daily.  Do not stop taking; it could cause a heart      attack.  8. Aspirin 81 mg daily.  9. Chantix 0.5 mg 1 daily on May 23 and then twice a day beginning May      24 for 4 days, then 1 mg twice a day.  10.Nitroglycerin as needed under the tongue for chest pain.   DISCHARGE INSTRUCTIONS:  1. May return to work in 6 weeks.  2. Increase activity slowly.  May walk under two flights of steps a       day.  3. May shower or bathe.  4. No lifting for 4 weeks and no driving for 2 weeks and restriction      of activity for 1 week.  5. Low-sodium heart-healthy diet.  6. Wash right groin cath site with soap and water.  To call for any      bleeding, swelling, or drainage.  7. Followup with Dr. Elsie Lincoln, Sep 29, 2006, at 10:45 a.m.  8. Stop __________.  9. Go our office on the 3rd floor today to pick up Plavix sample.   DISCHARGE CONDITION:  Improved.   PROCEDURES:  1. Emergent cardiac catheterization secondary to acute ST-elevated      myocardial infarction in his anterior leads.  2.  Sep 21, 2006, percutaneous transluminal coronary angioplasty and      stent employment, two Taxus stents overlapping the mid and proximal      left anterior descending.  Ejection fraction was found to be 30%.   HISTORY OF PRESENT ILLNESS:  This 55 year old white married male  patient, who had seen Dr. Alanda Amass over a year ago.  At that time had  cardiac catheterization and was found to have a 40-50% segmental  irregularities of the proximal left anterior descending and mid left  anterior descending.   He has not been followed since that time and was awakened on May 55 with  severe chest pain, short of breath, nausea, diaphoresis.  He took  sublingual nitroglycerin with some relief, called EMS, and was taken to  the [facility] Emergency Room with acute ST elevations in his  anterolateral leads to a significant degree.  He was taken immediately  to the cardiac cath lab, where he was found to have a 100% LAD stenosis,  underwent PTCA and stent employment with Taxus stents to that vessel.  EF was found to be 30%.  He stabilized and was admitted to [facility].   PAST MEDICAL HISTORY:  1. Hypertension.  2. Arthritis.  3. Gastroesophageal reflux disease.  4. Coronary disease as stated.   FAMILY HISTORY/SOCIAL HISTORY/REVIEW OF SYSTEMS:  See H&P.   ALLERGIES:  No known drug allergies.   OUTPATIENT  MEDICATIONS:  Were minimal and only on a p.r.n. basis except  for Lexapro that he was taking daily but otherwise hydrochlorothiazide  and Mobic as needed.   PHYSICAL EXAMINATION AT DISCHARGE:  VITAL SIGNS:  Blood pressure at  93/65, pulse 95, respirations 20, temperature 99, oxygen saturation 98%.  HEART:  Regular rate and rhythm.  No murmurs, gallops, or rubs.  Lungs were clear.  ABDOMEN:  Soft, nontender.  No masses.  EXTREMITIES:  Right groin cath site was stable without hematoma.  Trivial ecchymosis.   LABORATORY DATA:  Admitting laboratory:  Hemoglobin 15.8, hematocrit  45.7, WBC 8, platelets 213, neutrophils 52, lymphs 23, monos 13, eos 1,  basos 1.   Pro time was 12.4, INR was 0.9, PTT 23.   CHEMISTRY:  Sodium 134, potassium 4, chloride 102, glucose 144, BUN 10,  creatinine 0.86.  These remained essentially stable.  Potassium remained  stable.  Total protein 6.4, albumin 3.3, AST 433, ALT 16, ALP was 76,  total bili of 0.7, direct bili of 0.2, and indirect bili 0.5.   Glyco hemoglobin 5.4.  Cardiac enzymes:  Initial CK:  5342 with an MB of  702, troponin I greater than 100, followup gradually decreased and by  May 21, was 458 with an MB of 20.6 and relative index 4.5, troponin I of  34.34.   Total cholesterol 188, triglycerides 86, HDL 46, and LDL 125.  He will  be put on Statin as an outpatient.  It was held here because of elevated  LFTs.   RADIOLOGY:  No active disease.   EKG:  ST elevations in anterolateral leads, and reciprocal changes his  inferolateral leads.  These gradually improved.   HOSPITAL COURSE:  The patient was admitted emergently by Dr. Elsie Lincoln,  underwent emergent cardiac cath for his acute anterior wall ST-elevation  MI.  He tolerated the procedure well and was admitted to the ICU and was  started on Chantix for tobacco abuse.  EKG with evolving anterior MI. He was slowly improved over the next 2 to 3 days,  ambulating with  cardiac rehab, and by May  22, was stable and ready for discharge home.  He was seen and examined by Dr. Elsie Lincoln, who will follow up.      Darcella Gasman. Annie Paras, N.P.    ______________________________  Madaline Savage, M.D.    LRI/MEDQ  D:  09/24/2006  T:  09/24/2006  Job:  [id number]   cc:   Madaline Savage, M.D.  Richard A. Alanda Amass, M.D.

## 2010-09-17 NOTE — Cardiovascular Report (Signed)
NAME:  [name], [name]:  [id number]   MEDICAL RECORD NO.:  [id number]          PATIENT TYPE:  INP   LOCATION:  4705                         FACILITY:  [facility]   PHYSICIAN:  Cristy Hilts. Jacinto Halim, MD       DATE OF BIRTH:  Nov 04, 1955   DATE OF PROCEDURE:  06/11/2007  DATE OF DISCHARGE:                            CARDIAC CATHETERIZATION   PROCEDURE PERFORMED:  1. Left ventriculography.  2. Selective right and left coronary arteriography.  3. PTCA and balloon angioplasty of the thrombotic mid LAD stent.   INDICATION:  Trevor Griffin was brought into the [facility] cardiac  catheterization laboratory on an emergent basis via EMS.  A Code STEMI  was activated for an acute septal myocardial infarction.  He had ongoing  chest pain.  He has been taking Plavix about 5 out of 7 days and has  been irregular with his Plavix recently.  He continues to smoke.  He had  previously undergone PTCA and stenting of the proximal and entire mid  segment of the LAD with implantation of four DES stents that are 3.0 x  16 overlapped with 3.0 x 16, 3.0 x 24, and a 3.0 x 28-mm Taxus stents  performed on Sep 21, 2006.  He is now undergoing emergent cardiac  catheterization for an acute ST-elevation myocardial infarction.   HEMODYNAMIC DATA:  The left ventricular pressure was 94/3 with end  diastolic pressure of 22 mmHg.  Aortic pressure was 93/72 with a mean of  82 mmHg.  No significant pressure gradient across the aortic valve.   ANGIOGRAPHIC DATA:  Left ventricle:  Left ventricular systolic function  was preserved with an ejection fraction of 50% with mild global  hypokinesis.  There is no significant mitral regurgitation.   Right coronary artery:  Right coronary artery is a nondominant vessel  and is patent.   Left main coronary artery:  Left main coronary artery is a very large  caliber vessel and is very short.  It is smooth and normal.   Circumflex coronary artery:  Circumflex coronary  artery is a large  caliber vessel.  It is a dominant vessel.  Gives origin to a small high  OM-1 and a large OM-2.  Just after the origin of the OM-2 is a high-  grade 80% focal stenosis.  Just off the origin of the large obtuse  marginal two there is an 80% focal stenosis.  Followed by this, the  distal circumflex coronary artery gave origin to two PDA branches and a  PLA branch.  The branches and the circumflex otherwise were smooth and  normal and very large caliber vessels.   LAD:  The LAD is a very large caliber vessel.  There are four previously  placed stents that were noted in the proximal and mid LAD.  Outflow of  the stent has a 20-30% smooth stenosis which is unchanged from prior  cardiac catheterization.  The LAD wraps around the apex.  The stent  itself in the mid segment had a thrombotic 70% stenosis.   INTERVENTION DATA:  Successful PTCA and balloon  angioplasty of the  thrombotic stenosis in the mid LAD.  The 3 mm stents were dilated with a  3.5 x 12-mm Maverick throughout the entirety of the stents at 14  atmospheric pressure giving it a 3.8 mm lumen.  Post balloon angioplasty  angiography revealed excellent results without any residual filling  defects.  Prior to performing balloon angioplasty, Fetch aspiration  catheter was utilized for aspiration of the thrombus.  There was near-  complete resolution of his chest pain with the EKG back to baseline  after the procedure.   RECOMMENDATIONS:  The patient needs to be stressed regarding the  compliance with Plavix.  He will need cardiac markers to be followed up.  He will be observed in the hospital for a couple of days and will be  discharged home.  Smoking cessation is indicated.   A total of 180 mL of contrast was utilized for diagnostic and  interventional procedure.   TECHNIQUE OF THE PROCEDURE:  Under the usual sterile precautions using a  6-French right femoral arterial access, a 6-French multipurpose B2   catheter was advanced through the ascending aorta and then into the left  ventricle.  Left ventriculography was performed in the RAO projection.  The catheter then pulled into the ascending aorta and right coronary  artery was selectively engaged and angiography was performed.  The  catheter was then pulled out of body and a 6-French AL2 diagnostic  catheter was utilized to engage the left main coronary artery and  angiography was performed.   TECHNIQUE OF THE INTERVENTION:  Using heparin and Integrilin for  anticoagulation and using an AL3 guide to engage the left main coronary  artery, a 190 cm 0.014-inch ATW guidewire was advanced into the LAD.  Fetch aspiration catheter was utilized and multiple aspirations were  performed.  Having performed this, there was significant reduction in  the thrombus burden.  However, there was still residual thrombus.  Hence, I proceeded with balloon dilatation with a 3.5 x 12-mm Maverick  which was performed at 12 atmospheric pressure (rate of burst) with  reduction of stenosis from 70% to 0% with no residual thrombus at the  end of this procedure.  TIMI 3 flow was maintained pre and post  procedure.  Then the guidewire was withdrawn and guide catheter  disengaged and pulled out of the body.  The patient tolerated the  procedure well, no immediate complications noted.      Cristy Hilts. Jacinto Halim, MD  Electronically Signed     JRG/MEDQ  D:  06/11/2007  T:  06/13/2007  Job:  [id number]   cc:   Anselm Jungling, MD  Richard A. Alanda Amass, M.D.  [facility]

## 2010-09-17 NOTE — Discharge Summary (Signed)
NAME:  Trevor Griffin, Trevor Griffin NO.:  [id number]   MEDICAL RECORD NO.:  [id number]          PATIENT TYPE:  INP   LOCATION:  4705                         FACILITY:  [facility]   PHYSICIAN:  Abelino Derrick, P.A.   DATE OF BIRTH:  28-Mar-1956   DATE OF ADMISSION:  06/11/2007  DATE OF DISCHARGE:  06/15/2007                               DISCHARGE SUMMARY   Dr. Domingo Sep noted at discharge that the patient will probably require  an outpatient nuclear test to evaluate the residual disease of 80% in  the circumflex.  Will defer this to Dr. Elsie Griffin.      Abelino Derrick, P.ALenard Lance  D:  06/15/2007  T:  06/17/2007  Job:  [id number]

## 2010-09-17 NOTE — Procedures (Signed)
NAME:  Trevor Griffin, Trevor Griffin NO.:  [id number]   MEDICAL RECORD NO.:  [id number]          PATIENT TYPE:  OUT   LOCATION:  SLEEP CENTER                 FACILITY[facility]   PHYSICIAN:  Clinton D. Maple Hudson, MD, FCCP, FACPDATE OF BIRTH:  1956-04-26   DATE OF STUDY:  10/19/2006                            NOCTURNAL POLYSOMNOGRAM   REFERRING PHYSICIAN:  Madaline Savage, M.D.   INDICATIONS FOR PROCEDURE:  Hypersomnia with sleep apnea.   RESULTS:  Epward sleepiness score 2/24, BMI 26, weight 175 pounds.   MEDICATIONS:  Listed and reviewed.   SLEEP ARCHITECTURE:  Total sleep time 354 minutes with sleep efficiency  85%. Stage 1 is 5%; Stage 2, 68%; Stages 3 and 4, 5%. REM 21% of total  sleep time. Sleep latency 33 minutes; REM latency 178 minutes; Awake  after sleep onset 34 minutes. Arousal index 9.2. No bedtime medication  was taken.   RESPIRATORY DATA:  Apnea hypopnea index (AHI, RDI) 1 obstructive event  per hour, which is within normal limits (normal range 0 to 5 per hour.)  This included 1 obstructive apnea and 5 hypopnea's. Events were mostly  associated with lateral sleep. REM AHI 0.8 per hour. There were  insufficient events to qualify to CPAP titration by split protocol on  this study night.   OXYGEN DATA:  Mild to moderate intermittent snoring with oxygen  desaturation to a nadir of 88%. Mean oxygen saturation through the study  was 94% on room air.   CARDIAC DATA:  Sinus rhythm with occasional PVC's.   MOVEMENT/PARASOMNIA:  A total of 48 limb jerks were recorded, of which  24 were associated with arousal or awakening for periodic limb movement  with arousal index of 4.1 per hour, which is mildly increased. Bathroom  trips x2.   IMPRESSION/RECOMMENDATIONS:  1. Unremarkable sleep architecture for sleep center environment.  2. Occasional sleep disorder breathing events, AHI 1 per hour, which      is within normal limits (normal 0 to 5 per hour.) Events were  associated with mild to moderate snoring, intermittently, and      oxygen desaturation to a nadir of 88%.  3. No specific therapy is indicated by this score. Medically      significant sleep disordered breathing is not      demonstrated.  4. Mild periodic limb movement with arousal, 4.1 per hour.      Clinton D. Maple Hudson, MD, [facility], FACP  Diplomate, Biomedical engineer of Sleep Medicine  Electronically Signed     CDY/MEDQ  D:  10/25/2006 10:47:30  T:  10/25/2006 11:43:22  Job:  [id number]

## 2010-09-17 NOTE — Discharge Summary (Signed)
NAME:  Trevor Griffin, HEARD NO.:  [id number]   MEDICAL RECORD NO.:  [id number]          PATIENT TYPE:  INP   LOCATION:  4705                         FACILITY:  [facility]   PHYSICIAN:  Madaline Savage, M.D.DATE OF BIRTH:  08/14/1955   DATE OF ADMISSION:  06/11/2007  DATE OF DISCHARGE:  06/15/2007                               DISCHARGE SUMMARY   DISCHARGE DIAGNOSES:  1. SEMI.  This admission secondary to Plavix noncompliance, treated      with balloon angioplasty.  2. Known prior Taxus LAD stent May 2008.  3. Prior history of cardiomyopathy with an EF of 30% in 2008.  His EF      had improved to 50% at catheterization this admission.  4. Residual 80% circumflex.  5. History of smoking.  6. Dyslipidemia.   HOSPITAL COURSE:  The patient a 55 year old male with known coronary  disease.  He had a SEMI treated with LAD Taxus stenting Sep 21, 2006.  He had some problems affording his medications.  He has not been taking  his Plavix.  He presented June 11, 2007, with unstable angina.  He  was seen by Dr. Jacinto Halim, taken urgently the cath lab on June 11, 2007.  This revealed a thrombotic narrowing in the LAD Taxus stent and 80%  circumflex stenosis.  EF was 50%.  He underwent balloon angioplasty  within the stent with good results.  He did have positive enzymes this  admission with a CK peak of 551 and 73 MBs.  His EKG showed sinus rhythm  with septal ST elevation and septal Q-waves.  He was kept till June 15, 2007.  We have cut his medicines back, a little bit for hypotension  at discharge.  Will try and keep his medicines generic wherever possible  to hold costs down and increase compliance.  We did have him seen by the  smoking cessation people, and he will attempt to quit smoking.  Hopefully with the help of Chantix, although this is not covered by  insurance.   DISCHARGE MEDICATIONS:  1. Aspirin 325 mg once a day.  2. Plavix 75 mg a day.  3. Prilosec  over-the-counter once a day.  4. Metoprolol 50 mg 1/2 tablet twice a day.  5. Lisinopril 2.5 mg a day.  6. Lexapro 10 mg a day.  7. Zocor 80 mg a day.  8. Nitroglycerin sublingual p.r.n.  9. Chantix, if he can afford it to quit smoking.   LABORATORY DATA:  INR is 1.2.  Chest x-ray showed no acute disease.  TSH  is pending at discharge.  BNP is 146.  CKs is peaked as noted above.  Liver functions were normal.  Sodium 134, potassium 3.8, BUN 10,  creatinine 0.8, white count 5, hemoglobin 12.9, hematocrit 37.4,  platelets 171.   DISPOSITION:  The patient discharged stable condition and will follow-up  with Dr. Elsie Lincoln as an outpatient.      Abelino Derrick, P.A.    ______________________________  Madaline Savage, M.D.    Lenard Lance  D:  06/15/2007  T:  06/16/2007  Job:  [id number]   cc:   Madaline Savage, M.D.

## 2010-09-17 NOTE — Cardiovascular Report (Signed)
NAME:  Trevor Griffin, Trevor Griffin NO.:  [id number]   MEDICAL RECORD NO.:  [id number]          PATIENT TYPE:  INP   LOCATION:  2023                         FACILITY:  [facility]   PHYSICIAN:  Madaline Savage, M.D.DATE OF BIRTH:  [birth year]   DATE OF PROCEDURE:  09/21/2006  DATE OF DISCHARGE:  09/24/2006                            CARDIAC CATHETERIZATION   PROCEDURES PERFORMED:  1. Emergency cardiac catheterization consisting of  (a) selective      coronary angiography, (b) left ventricular angiography, (c) left      heart catheterization.  2. Intracoronary artery stenting of multiple sites along the left      anterior descending coronary artery for code ST elevation      myocardial infarction.   PATIENT PROFILE:  The patient is a 55 year old gentleman with not much  in the way of prior cardiac care who presented to an outlying facility  with acute chest pain and ST-segment elevations resembling a tombstone  configuration in the anteroseptal and anterior leads.  I met the patient  in the cath lab at around 3:40 a.m.  He was consented, assessed, and the  procedure was then begun.  His initial blood pressure was 150/120, and  he was medicated for that.  He was given Angiomax, and Plavix was  ordered for the patient and was later given.   Diagnostic cardiac catheterization was begun.  The left coronary artery  was initially intubated with a Judkins left 4 diagnostic catheter.  It  was noted that the patient at that point had a very large short left  main coronary artery and had a 100% occlusion of the LAD proximal to the  first septal perforator branch.  There was a large circumflex branch  which gave rise to an obtuse marginal branch midway down the vessel, and  then the distal circumflex gave rise to both posterior descending and  posterolateral branches and a large atrial circumflex branch.  Right  coronary artery catheterization was then performed with a standard  Judkins  right catheter and showed that the right coronary artery was as  expected a small vessel with one acute marginal branch, and the RCA  became a twin vessel near the acute angle of the heart.  Left  ventricular angiography was then performed showing a stunned  anterolateral wall.  About three-quarters of the anterior wall was  involved.  The apex and inferobasal portions were also hypokinetic.  There was hyperdynamic movement of the anterobasal segment and at least  one-half to two-thirds of the inferobasal wall was hyperdynamic.  I  would call the overall ejection fraction 30%.  There was no evidence of  mitral regurgitation and no evidence of thrombus.  The LV apex was  dyskinetic.   The best guide catheter found to be used for anticipated intervention  proved to be an Amplatz left Judkins configuration.  The 100% occluded  LAD was then crossed with a Prowater wire and brought to rest about 20-  25 mm beyond the completely stenotic proximal LAD.  We then saw staining  of dye into the midvessel.  Only TIMI  1 flow was then noted.  TIMI 0  flow was noted initially.  After guidewire manipulations over the course  of the next 10 minutes or so, I was able to successfully get the  Prowater wire distally into the vessel and then used a Maverick balloon  to dotter the vessel to gradually improve flow in the vessel.  After  dottering the mid-LAD, again, more staining of the distal LAD could be  noted, and it was decided at that point to direct stent.  I ended up  placing all Taxus Express Monorail stents.  The first was a 3.0 x 16-mm  stent, and I placed it fairly proximally in the LAD to cover the area of  stenotic vessel adequately.  Once deployed, there was a very nice result  achieved and there was much better flow into the distal LAD.  It was now  TIMI 2.5 to 3 flow.  There was noted to be marked tortuosity of the mid-  LAD near an acute diagonal branch, and I decided that that needed to be   stented as well because it appeared to be at least 75% stenotic.  I  wanted to cross it with a longer Taxus stent but was unable to cross  when I attempted.  I therefore used a smaller Taxus stents and was able  to build one long stent that ended up being four stents in length, all  Taxus Monorail stents, all 3.0.  The length of each was 16, 16, 24, and  28 mm in length.  They were all overlapped and they were all dilated to  maximal balloon inflation pressures   The patient was hemodynamically stable.  Blood pressure initially was  high and was brought down with intracoronary nitroglycerin and IV  nitroglycerin as well.   This was a technically difficult case but was successfully performed,  and TIMI 3 0 flow in the proximal LAD was converted to TIMI III flow  with a gratifying and dramatic resolution of 100% occlusion down to 0%  residual stenosis.  The patient was taken from the cath lab to the CCU  by our nurses and cardiovascular technicians who were involved with the  case, and no complications occurred during this long and difficult case.   FINAL IMPRESSIONS:  1. Acute ST-segment elevation myocardial infarction, anterior.  2. Essentially single-vessel coronary artery disease of proximal and      mid-left anterior descending.  3. Stunned left ventricular function with ejection fraction estimate      of 30% with extensive anterior wall motion abnormalities,      dyskinetic apical motion, and impaired distal inferior wall motion      as well.  4. Successful stenting of four sites in the left anterior descending,      including very proximal, proximal mid, and mid-to-distal.           ______________________________  Madaline Savage, M.D.     WHG/MEDQ  D:  10/02/2006  T:  10/02/2006  Job:  [id number]   cc:   [facility]  [facility] Medical Records
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
## 2010-09-20 NOTE — Discharge Summary (Signed)
NAME:  Trevor Griffin, Trevor Griffin NO.:  [id number]   MEDICAL RECORD NO.:  [id number]          PATIENT TYPE:  INP   LOCATION:  4740                         FACILITY:  [facility]   PHYSICIAN:  Dani Gobble, MD       DATE OF BIRTH:  [birth year]   DATE OF ADMISSION:  05/13/2004  DATE OF DISCHARGE:  05/15/2004                                 DISCHARGE SUMMARY   ADMISSION DIAGNOSES:  1.  Chest pain.  2.  Abnormality on chest x-ray with questionable nodule.  3.  Chronic obstructive pulmonary disease.  4.  Tobacco use.  5.  Emphysema.   DISCHARGE DIAGNOSES:  1.  Chest pain.  2.  Abnormality on chest x-ray with questionable nodule.  3.  Chronic obstructive pulmonary disease.  4.  Tobacco use.  5.  Emphysema.  6.  Status post cardiac catheterization.  Findings consistent with just 40-      50% left anterior descending stenosis.  7.  Status post CT scan to follow up abnormal chest x-ray.  CT scan shows no      nodule and no dissection.   HISTORY OF PRESENT ILLNESS:  Mr. Trevor Griffin is a 55 year old male who presents  to the ER on May 13, 2004, with complaints of chest pain.   He states that the chest pain began the previous night.  It was not related  to exertion.  He had no history of hypertension, but when he checked his  blood pressure that previous night, it was 178/112 when his wife checked it.  As well, he did feel diaphoretic.  His wife gave him a nitroglycerin and the  pain eased.  He described it as a pressure on the left side, worse with deep  breaths and with position changes when he would lie on his back and his  side.  Sitting up seemed to ease the pain.  He had had no previous episodes  prior to the previous night.  He had been experiencing some shortness of  breath all of the previous day.  At the time of our evaluation, he was  complaining of a pressure feeling in his chest.  He felt that his breathing  felt tight and could not get a deep breath.  He had been a  long-term smoker  and was diagnosed with emphysema and has daily shortness of breath and daily  productive cough.  He had seen a small amount of blood on occasion.  At that  point he did not know his lipid status and there was no known family history  of CAD.   PHYSICAL EXAMINATION:  Blood pressure elevated at 160/105, heart rate 82,  oxygen saturation 100% on room air.  Once IV nitroglycerin was started, the  repeat blood pressure was 129/85.  The heart was in regular rhythm with a  1/6 systolic murmur.  There were some subtle crackles at the base of the  right lung.  No other significant abnormalities on physical exam.   LABORATORY DATA:  Initial cardiac enzymes negative and other laboratories  stable.  EKG showed sinus rhythm at 76  beats per minute.  There was some LVH  and probable repolarization changes, but could not exclude pericarditis.  Repeat EKG was unchanged.  Chest x-ray was reviewed and showed questionable  left lower lobe nodule.  They recommended followup.   At that time, the patient was seen and evaluated by Dani Gobble, M.D.  It  was felt that chest pain had some both typical and atypical features.  At  that point, she planned to check a 2-D echocardiogram.  As well, we would  get a CT of the chest to evaluate for aortic dissection and also evaluate  the possible nodule.  Planned no heparin until we got a CT scan and also  until the echocardiogram was reviewed.  She planned to continue IV  nitroglycerin for the time being.  Planned for DT prophylaxis because of  shakes and history of alcohol use.  It was felt that he may need a  catheterization once we checked echocardiogram, CT, etc.   HOSPITAL COURSE:  On May 14, 2004, Mr. Trevor Griffin was with no chest pain.  Cardiac enzymes were negative x 2.  CT scan was negative for dissection.  At  that point, the patient was seen and evaluated by Richard A. Alanda Amass, M.D.  It was felt that we needed to proceed with definitive  cardiac  catheterization.   On May 14, 2004, he underwent cardiac catheterization by Richard A.  Alanda Amass, M.D.  He was found to have nonobstructive CAD.  He had 40-50%  stenosis in the LAD.  No other significant CAD.  The EF was greater than  55%.  He tolerated the procedure well without complication.  We planned for  medical therapy of his coronary artery disease and would use empiric GI  therapy.  Planned for discharge home in the morning if stable.   On May 15, 2004, Mr. Trevor Griffin remained stable.  He was having no chest  pain.  Blood pressure stable at 113/83, heart rate 83 and he was afebrile.  The lungs were clear.  The heart was regular rhythm.  The right groin was  stable without hematoma.  He was maintaining sinus rhythm without  arrhythmia.  At this point, he was seen and evaluated by Nanetta Batty,  M.D., who deemed him stable for discharge home.   HOSPITAL CONSULTS:  None.   HOSPITAL PROCEDURES:  1.  Cardiac catheterization on May 14, 2004, by Richard A. Alanda Amass,      M.D.  He had 40-50% stenosis in the LAD.  Planned for continued medical      therapy.  Normal LV function.  2.  A 2-D echocardiogram on May 13, 2004, showed EF 55-65%, LV diastolic      function normal, mild MR, mild to moderate concentric LVH with normal      systolic and diastolic parameters, no significant valvular abnormality      and normal chamber dimensions.  3.  Electrocardiogram on admission showed sinus rhythm, some LVH and      nonspecific ST-T change.   RADIOLOGY:  1.  Chest x-ray on May 13, 2004, showed current nipple marker film was      done portably.  I would suggest a PA lateral chest when the patient is      able.  They could not rule out a nodule at the left lung base.  2.  Chest x-ray on May 13, 2004, showed nodular shadow associated with     left base most likely presenting nipple shadow and no  acute infiltrate.  3.  CT scan on May 13, 2004, showed negative  CT of the chest and no      aortic dissection.  There was a 3 cm right renal cyst.  Otherwise      negative CT of the abdomen.  No aortic dissection.   LABORATORY DATA:  Urinalysis is normal.  Cardiac markers negative x 3.  TSH  normal at 1.402.  Lipid profile shows total cholesterol 188, triglycerides  245, HDL 53 and LDL 86.  Cardiac enzymes were negative with CKs of 77 and  80, MBs 0.7 and 0.8 and troponin 0.01.  Hemoglobin A1C 5.4.  Liver function  tests normal.  Hemoccult blood negative.  On admission, white count 5.1,  hemoglobin 14.1, hematocrit 40.3 and platelets 209.  These remained stable.  Electrolytes also were normal throughout the admission.  On admission,  sodium 134, potassium 3.8, BUN 10 and creatinine 0.9.  At discharge, BUN 8,  creatinine 1.0 and potassium 3.9.  They all remained stable.   DISCHARGE MEDICATIONS:  1.  Aspirin 81 mg a day.  2.  Protonix 40 mg a day for one month and then Prilosec over the counter if      needed.  3.  Altace 2.5 mg a day.  4.  Wellbutrin SR 150 mg once a day for three days and then twice a day for      three months.   ACTIVITY:  No work until Monday, May 20, 2004.  No strenuous activity,  lifting greater than 5 pounds or driving for three days.   DIET:  Low-cholesterol diet.   SPECIAL INSTRUCTIONS:  Stop smoking and decrease your alcohol.   FOLLOWUP:  Follow up with doctor.  Call [phone] to make an appointment to  see Dr. Domingo Sep in two weeks.      MBE/MEDQ  D:  07/17/2004  T:  07/17/2004  Job:  [id number]
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
## 2010-09-20 NOTE — Discharge Summary (Signed)
NAME:  Trevor Griffin, Trevor Griffin NO.:  [id number]   MEDICAL RECORD NO.:  [id number]          PATIENT TYPE:  IPS   LOCATION:  0503                          [facility]ACILITY[facility]  [facility]   PHYSICIAN:  Anselm Jungling, MD  DATE OF BIRTH:  [birth year]   DATE OF ADMISSION:  01/02/2006  DATE OF DISCHARGE:  01/07/2006                                 DISCHARGE SUMMARY   IDENTIFYING DATA AND REASON FOR ADMISSION:  The patient is a 55 year old  married white male who presented as a walk-in, requesting detoxification.  He tried to stop drinking on his own, but found himself going into severe  alcohol withdrawal, and started having visual hallucinations.  He had  apparently recently quit his job due to agitation and stress, and presumably  alcohol related features.  He had also been dealing with the loss of a  friend who suicided  approximately 1-2 years prior to admission.  Please  refer to the admission note for further details pertaining to the symptoms,  circumstances and history that led to his hospitalization.  He was given  initial Axis I diagnosis of alcohol dependence, and depressive disorder NOS.   MEDICAL AND LABORATORY:  The patient was medically and physically assessed  by the psychiatric nurse practitioner upon admission.  His usual primary  care Trevor Griffin is Dr. Karleen Hampshire at [facility].  He came to [place] with a history of  hypertension, emphysema, and cardiac stent placement done in January 2006.  He was continued on hydrochlorothiazide 25 mg daily.  There were no acute  medical issues during his inpatient stay.   HOSPITAL COURSE:  The patient was admitted to the adult inpatient  psychiatric service.  He presented as a well-nourished, normally developed  adult male who was well organized.  His mood was depressed and anxious.  His  thoughts and speech were normally organized and there was nothing to suggest  any underlying psychosis, thought disorder or delirium.  He was placed on a  Librium withdrawal protocol.  He was also continued on Lexapro 10 mg daily  for depression.   His detoxification process was uneventful.  He had no further visual  hallucinations and did not become involved in any frank symptoms of delirium  tremors.   During his hospital stay he participated in various therapeutic groups and  activities, including alcoholics anonymous meetings on the unit.  He agreed  thoroughly that he needed to become fully abstinent of alcohol on a  permanent basis.  Prior to discharge, there was a family meeting involving  his wife.  There he did discuss the fact that he is prone to depression, but  he denied suicidal ideation.  His wife made very supportive statements, but  indicated that if he continued to drink, that this would likely result in  their parting.  The patient's wife indicated that she felt her husband  needed further treatment, and also mentioned that he gets upset very easily.  The patient indicated that he was willing to remain abstinent and felt that  he could stop on his own.  His wife encouraged him  to go an outpatient  program following his discharge from our program, stating her fear that he  would relapse and go back to drinking heavily.  The patient commented that  Alcoholics Anonymous groups had not helped him when he participated with  them in the past.   On the following day, the patient's reports about the family meeting with  his wife were more positive than  described above, but he indicated that he  felt ready for discharge.  He did appear to be completed with the  detoxification process and did not appear to be at risk for any sequelae of  alcohol cessation.   AFTERCARE:  The patient was to follow-up with [facility] counseling center  in [place] with an appointment on January 21, 2006.  There, he was to  see Donnie Aho.  From there, he will be referred for further medication  management if necessary and individual  counseling.   DISCHARGE MEDICATIONS:  Lexapro 10 mg daily and hydrochlorothiazide 25 mg  daily.   DISCHARGE DIAGNOSES:  AXIS I: Alcohol dependence, early remission and  depressive disorder NOS.  AXIS II: Deferred.  AXIS III: History of hypertension, status post cardiac stent placement.  AXIS IV: Stressors severe.  AXIS V: Global assessment of functioning on discharge 65.      Anselm Jungling, MD  Electronically Signed     SPB/MEDQ  D:  01/07/2006  T:  01/07/2006  Job:  [id number]

## 2010-09-20 NOTE — H&P (Signed)
NAME:  Trevor Griffin, Trevor Griffin NO.:  [id number]   MEDICAL RECORD NO.:  [id number]          PATIENT TYPE:  INP   LOCATION:  1827                         FACILITY:  [facility]   PHYSICIAN:  Dani Gobble, MD       DATE OF BIRTH:  [birth year]   DATE OF ADMISSION:  05/13/2004  DATE OF DISCHARGE:                                HISTORY & PHYSICAL   CHIEF COMPLAINT:  Chest pain.   HISTORY OF PRESENT ILLNESS:  Mr. Trevor Griffin is a 55 year old patient with no  regular medical care who began having chest pain that started without  exertion on the night prior to presentation. He does not have a history of  hypertension, but his wife took his blood pressure at that time and it was  178/112. He had complaints of feeling hot and diaphoretic and difficulty  breathing prior to the onset of this episode. He describes the pain as  pressure on the left side that was worse with deep breath and position  changes, most notably worse when he would lay on his back and his sides.  Sitting up eased his pain. He has never had an episode like this before. His  wife gave him one of her nitroglycerin pills and this eased off his pain.  At the current time of evaluation he has some substernal pressure feeling,  but no pain. He felt that his breathing was still tight, like he cannot get  a breath, but was not tachypneic.   PAST MEDICAL HISTORY:  Longterm smoker with a diagnosis of emphysema. He has  daily shortness of breath with daily productive cough and has noticed a  small amount of blood on occasion. He does not have a diagnosis of  hypertension and his wife states she will normally check his blood pressure  on random occasions and it will be in the 120s over 60s. His lipid status is  not known.   ALLERGIES:  No known drug allergies.   MEDICATIONS:  None.   FAMILY HISTORY:  Not much is known. His mother died in her 30s from kidney  disease. Father died in his 45s. He was an alcoholic, but died of old  age.  He has one brother and sister who are alive and specifics of health being is  not well known. His sister died of liver disease and one sister died with a  brain tumor.   SOCIAL HISTORY:  He is a one-pack per day smoker times 30 years. He drinks a  large amount of alcohol on a regular basis; mostly beer during the week and  bourbon on the weekends. He has been to alcoholic detox rehab before and  that time was given Valium. He does not have a  history of DTs. He has been  married for 11 years. He currently works at [employer] [place] and does a lot of  heavy lifting. He rides a bicycle without any chest pain or bleeding  problems for recreation.   REVIEW OF SYSTEMS:  As per HPI. HEENT: He had a URI approximately four weeks  ago. His wife  felt his face looked swollen the night before. He does not  have dentures. CARDIOVASCULAR/PULMONARY: As per HPI. ABDOMEN/GI: No nausea,  vomiting, diarrhea, or constipation. EXTREMITIES: No swelling. SKIN: No  rashes. ENDOCRINE: No history of diabetes or thyroid problems.  MUSCULOSKELETAL: No joint or muscle changes.   PHYSICAL EXAMINATION:  VITAL SIGNS: Temperature 98.0, pulse 82, blood  pressure 160/105, respirations 20, oxygen saturation 100% on room air. His  blood pressure after nitroglycerin was 129/85.  GENERAL: He is awake, alert, and in no acute distress pleasant white male.  HEENT: Anicteric. Extraocular muscles are intact. Oropharynx is slightly dry  and edentulous.  NECK: Supple without lymphadenopathy or thyromegaly.  CHEST: Nitroglycerin in place. PMI not displaced.  HEART: Regular rate and rhythm. No murmurs, rubs, or gallops.  LUNGS: Clear to auscultation bilaterally without wheezing or rhonchi, good  air movement.  ABDOMEN: Soft, nontender, without normoactive bowel sounds and no abdominal  bruits.  EXTREMITIES: No edema. 2+ distal pulses.  SKIN: Abrasion on the right hand and he has some multiple hyper- and  hypopigmented macules  scattered on his back. Otherwise, no other rashes.   I-STAT laboratories in the ER were notable for normal electrolytes with  creatinine of 0.9, hemoglobin 16, and the first set of point of care enzymes  were normal with a CK-MB of less than 1, myoglobin of 70, and troponin-I of  less than 0.05.   EKG shows normal sinus rhythm with a rate of 76 beats per minute, notable  for ST elevation in V2 through V4, and an ST depression in aVR. Chest x-ray  shows a left lower lobe nodule, probably a nipple. Radiology recommends  repeating this with lateral exam with nipple markers (heart within normal  limits and no acute cardiopulmonary disease noted).   ASSESSMENT/PLAN:  This is a 55 year old male with multiple cardiac risk  factors who presents with chest pain.  1.  Chest pain. His multiple risk factors include being a male, long-term      smoker, unknown lipids and unclear family history, who presented with      semi-typical anginal-type chest pain, specifically it was left-sided      pressure and was relieved with nitroglycerin, but also had some      characteristics made positive beyond acute MI which include the fact      that it is pleuritic in nature and had positional changes. The main      concern is possible pericarditis. Will check with an echo. Note that the      patient had had some viral symptoms in the weeks preceding this; also      concern for __________ and rule this out with a CT.  If these tests are      not revealing, will admit the patient for rule out MI with serial      enzymes and electrocardiogram, telemetry, oxygen, aspirin, and beta      blocker. Check a fasting lipid for risk assessment. The patient is ruled      out for myocardial infarction and likely will benefit from risk      stratification study, for example Cardiolite.  2.  Abnormal chest x-ray. Question of lower __________ on initial chest x-     ray. The repeat AP with nipple markers decrease suspicion. Will  follow      this up with a CT of the chest because we are doing this for the concern      for aortic dissection; however,  consideration for outpatient followup      would considered. Due to patient's long-term smoking history and      intermittent mild hemoptysis, he may benefit from an additional      bronchoscopy for evaluation and rule out of lung cancer.  3.  Chronic obstructive pulmonary disease. He is currently without symptoms      or signs of exacerbation. Will follow. The patient may be at increased      risk for bronchospasm with a beta blocker, but would proceed with this      treatment from a cardiac standpoint at the time being.  4.  Tobacco use. Encourage cessation.  5.  Heavy alcohol use.  The patient is at risk for alcohol withdrawal. Will      proceed with alcohol withdrawal protocol.   DISPOSITION:  Pending.     CH/MEDQ  D:  05/13/2004  T:  05/13/2004  Job:  [id number]   cc:   [facility]

## 2011-01-24 LAB — PROTIME-INR
INR: 1.2
Prothrombin Time: 15.5 — ABNORMAL HIGH

## 2011-01-24 LAB — CBC
HCT: 36.2 — ABNORMAL LOW
HCT: 37.4 — ABNORMAL LOW
HCT: 42.6
Hemoglobin: 14.6
MCHC: 34.4
MCHC: 34.6
MCV: 96.6
MCV: 96.6
Platelets: 160
Platelets: 207
RBC: 3.75 — ABNORMAL LOW
RBC: 3.87 — ABNORMAL LOW
RBC: 3.98 — ABNORMAL LOW
RDW: 13.5
RDW: 13.6
WBC: 3.8 — ABNORMAL LOW
WBC: 4.8

## 2011-01-24 LAB — BASIC METABOLIC PANEL
BUN: 10
BUN: 10
CO2: 25
CO2: 27
Calcium: 8 — ABNORMAL LOW
Chloride: 100
Chloride: 97
Chloride: 99
Creatinine, Ser: 0.82
GFR calc Af Amer: >60
GFR calc Af Amer: >60
GFR calc non Af Amer: >60
Glucose, Bld: 103 — ABNORMAL HIGH
Potassium: 3.3 — ABNORMAL LOW
Potassium: 3.7
Potassium: 3.8
Sodium: 127 — ABNORMAL LOW

## 2011-01-24 LAB — CARDIAC PANEL(CRET KIN+CKTOT+MB+TROPI)
CK, MB: 2.3
Relative Index: 10.6 — ABNORMAL HIGH
Relative Index: 6 — ABNORMAL HIGH
Total CK: 520 — ABNORMAL HIGH
Troponin I: 14.67

## 2011-01-24 LAB — COMPREHENSIVE METABOLIC PANEL
ALT: 17
AST: 17
BUN: 16
Calcium: 8 — ABNORMAL LOW
Creatinine, Ser: 0.92
GFR calc non Af Amer: >60
Sodium: 132 — ABNORMAL LOW
Total Bilirubin: 1

## 2011-01-24 LAB — I-STAT EC8
Acid-base deficit: 7 — ABNORMAL HIGH
BUN: 18
Glucose, Bld: 115 — ABNORMAL HIGH
Operator id: 298401
Potassium: 3.8
TCO2: 20
pCO2 arterial: 38.8

## 2011-01-24 LAB — LIPID PANEL
Cholesterol: 175
Cholesterol: 190
LDL Cholesterol: 117 — ABNORMAL HIGH
LDL Cholesterol: 132 — ABNORMAL HIGH
Total CHOL/HDL Ratio: 5.1
Triglycerides: 111
VLDL: 21

## 2011-01-24 LAB — PLATELET COUNT: Platelets: 181

## 2011-01-24 LAB — APTT: aPTT: 194 — ABNORMAL HIGH

## 2011-02-04 ENCOUNTER — Emergency Department (HOSPITAL_COMMUNITY): Payer: Managed Care, Other (non HMO)

## 2011-02-04 ENCOUNTER — Inpatient Hospital Stay (HOSPITAL_COMMUNITY)
Admission: EM | Admit: 2011-02-04 | Discharge: 2011-02-06 | DRG: 247 | Disposition: A | Discharge status: Home / Self Care | Payer: Managed Care, Other (non HMO) | Source: Ambulatory Visit | Attending: Cardiology | Admitting: Cardiology

## 2011-02-04 DIAGNOSIS — E663 Overweight: Secondary | ICD-10-CM | POA: Diagnosis present

## 2011-02-04 DIAGNOSIS — F3289 Other specified depressive episodes: Secondary | ICD-10-CM | POA: Diagnosis present

## 2011-02-04 DIAGNOSIS — F17200 Nicotine dependence, unspecified, uncomplicated: Secondary | ICD-10-CM | POA: Diagnosis present

## 2011-02-04 DIAGNOSIS — Z91199 Patient's noncompliance with other medical treatment and regimen due to unspecified reason: Secondary | ICD-10-CM

## 2011-02-04 DIAGNOSIS — Z9119 Patient's noncompliance with other medical treatment and regimen: Secondary | ICD-10-CM

## 2011-02-04 DIAGNOSIS — I252 Old myocardial infarction: Secondary | ICD-10-CM

## 2011-02-04 DIAGNOSIS — Z79899 Other long term (current) drug therapy: Secondary | ICD-10-CM

## 2011-02-04 DIAGNOSIS — I2510 Atherosclerotic heart disease of native coronary artery without angina pectoris: Principal | ICD-10-CM | POA: Diagnosis present

## 2011-02-04 DIAGNOSIS — Z7982 Long term (current) use of aspirin: Secondary | ICD-10-CM

## 2011-02-04 DIAGNOSIS — I200 Unstable angina: Secondary | ICD-10-CM

## 2011-02-04 DIAGNOSIS — I10 Essential (primary) hypertension: Secondary | ICD-10-CM | POA: Diagnosis present

## 2011-02-04 DIAGNOSIS — I2589 Other forms of chronic ischemic heart disease: Secondary | ICD-10-CM | POA: Diagnosis present

## 2011-02-04 DIAGNOSIS — Z9861 Coronary angioplasty status: Secondary | ICD-10-CM

## 2011-02-04 DIAGNOSIS — E785 Hyperlipidemia, unspecified: Secondary | ICD-10-CM | POA: Diagnosis present

## 2011-02-04 DIAGNOSIS — F1010 Alcohol abuse, uncomplicated: Secondary | ICD-10-CM | POA: Diagnosis present

## 2011-02-04 DIAGNOSIS — F329 Major depressive disorder, single episode, unspecified: Secondary | ICD-10-CM | POA: Diagnosis present

## 2011-02-04 LAB — CARDIAC PANEL(CRET KIN+CKTOT+MB+TROPI)
CK, MB: 3 ng/mL (ref 0.3–4.0)
Total CK: 100 U/L (ref 7–232)
Troponin I: <0.3 ng/mL (ref <0.30)

## 2011-02-04 LAB — CBC
HCT: 43.5 % (ref 39.0–52.0)
MCH: 34.7 pg — ABNORMAL HIGH (ref 26.0–34.0)
MCHC: 35.9 g/dL (ref 30.0–36.0)
MCV: 96.9 fL (ref 78.0–100.0)
Platelets: 155 10*3/uL (ref 150–400)
RDW: 13 % (ref 11.5–15.5)
WBC: 3.9 10*3/uL — ABNORMAL LOW (ref 4.0–10.5)

## 2011-02-04 LAB — URINALYSIS, ROUTINE W REFLEX MICROSCOPIC
Bilirubin Urine: NEGATIVE
Hgb urine dipstick: NEGATIVE
Ketones, ur: NEGATIVE mg/dL
Nitrite: NEGATIVE
Protein, ur: NEGATIVE mg/dL
Urobilinogen, UA: 0.2 mg/dL (ref 0.0–1.0)

## 2011-02-04 LAB — DIFFERENTIAL
Eosinophils Absolute: 0.1 10*3/uL (ref 0.0–0.7)
Eosinophils Relative: 2 % (ref 0–5)
Lymphocytes Relative: 30 % (ref 12–46)
Lymphs Abs: 1.2 10*3/uL (ref 0.7–4.0)
Monocytes Absolute: 0.5 10*3/uL (ref 0.1–1.0)
Monocytes Relative: 12 % (ref 3–12)

## 2011-02-04 LAB — POCT I-STAT, CHEM 8
BUN: 12 mg/dL (ref 6–23)
Calcium, Ion: 1.22 mmol/L (ref 1.12–1.32)
Creatinine, Ser: 0.9 mg/dL (ref 0.50–1.35)
Hemoglobin: 16 g/dL (ref 13.0–17.0)
Sodium: 135 mEq/L (ref 135–145)
TCO2: 23 mmol/L (ref 0–100)

## 2011-02-04 LAB — POCT I-STAT TROPONIN I

## 2011-02-04 LAB — PROTIME-INR: Prothrombin Time: 13.1 seconds (ref 11.6–15.2)

## 2011-02-05 DIAGNOSIS — I519 Heart disease, unspecified: Secondary | ICD-10-CM

## 2011-02-05 DIAGNOSIS — I214 Non-ST elevation (NSTEMI) myocardial infarction: Secondary | ICD-10-CM

## 2011-02-05 LAB — CBC
HCT: 39.7 % (ref 39.0–52.0)
MCHC: 34.3 g/dL (ref 30.0–36.0)
MCV: 98 fL (ref 78.0–100.0)
Platelets: 137 10*3/uL — ABNORMAL LOW (ref 150–400)
RDW: 12.9 % (ref 11.5–15.5)
WBC: 6.1 10*3/uL (ref 4.0–10.5)

## 2011-02-05 LAB — TSH: TSH: 4.397 u[IU]/mL (ref 0.350–4.500)

## 2011-02-05 LAB — BASIC METABOLIC PANEL
BUN: 13 mg/dL (ref 6–23)
Calcium: 9.3 mg/dL (ref 8.4–10.5)
GFR calc Af Amer: >90 mL/min (ref >90)
GFR calc non Af Amer: >90 mL/min (ref >90)
Glucose, Bld: 126 mg/dL — ABNORMAL HIGH (ref 70–99)
Potassium: 4.3 mEq/L (ref 3.5–5.1)
Sodium: 132 mEq/L — ABNORMAL LOW (ref 135–145)

## 2011-02-05 LAB — CARDIAC PANEL(CRET KIN+CKTOT+MB+TROPI)
Relative Index: INVALID (ref 0.0–2.5)
Total CK: 87 U/L (ref 7–232)
Troponin I: <0.3 ng/mL (ref <0.30)

## 2011-02-05 LAB — LIPID PANEL: LDL Cholesterol: 108 mg/dL — ABNORMAL HIGH (ref 0–99)

## 2011-02-05 LAB — HEPATIC FUNCTION PANEL
Bilirubin, Direct: 0.1 mg/dL (ref 0.0–0.3)
Indirect Bilirubin: 0.4 mg/dL (ref 0.3–0.9)
Total Protein: 6.5 g/dL (ref 6.0–8.3)

## 2011-02-05 LAB — HEMOGLOBIN A1C
Hgb A1c MFr Bld: 5.5 % (ref <5.7)
Mean Plasma Glucose: 111 mg/dL (ref <117)

## 2011-02-06 LAB — BASIC METABOLIC PANEL
GFR calc Af Amer: >90 mL/min (ref >90)
GFR calc non Af Amer: >90 mL/min (ref >90)
Potassium: 4 mEq/L (ref 3.5–5.1)
Sodium: 133 mEq/L — ABNORMAL LOW (ref 135–145)

## 2011-02-06 LAB — CBC
MCHC: 35.2 g/dL (ref 30.0–36.0)
Platelets: 138 10*3/uL — ABNORMAL LOW (ref 150–400)
RDW: 12.8 % (ref 11.5–15.5)

## 2011-02-10 ENCOUNTER — Telehealth: Payer: Self-pay | Admitting: Cardiology

## 2011-02-10 MED ORDER — NICOTINE 21 MG/24HR TD PT24: MEDICATED_PATCH | TRANSDERMAL | Status: DC

## 2011-02-10 NOTE — Telephone Encounter (Signed)
Pt wants to know if they can get a prescription for nicorette patches to see if the insurance will pay?   Please call them back and advise.  If not, can he get a higher dose of Wellbutrin?

## 2011-02-10 NOTE — Telephone Encounter (Signed)
Pt discharged on nicotine 21mg  daily for 6 weeks then  14mg  daily for 2 weeks then 7mg  daily for 2 weeks. Pt also discharged on welbutrin  150mg  bid. OK to take both per [facility].

## 2011-02-11 NOTE — Cardiovascular Report (Signed)
NAME:  Trevor Griffin, NOU NO.:  [id number]  MEDICAL RECORD NO.:  [id number]  LOCATION:  2503                         FACILITY:  [facility]  PHYSICIAN:  Trevor Griffin, M.D.  DATE OF BIRTH:  August 16, 1955  DATE OF PROCEDURE: DATE OF DISCHARGE:                           CARDIAC CATHETERIZATION   INDICATIONS FOR PROCEDURE:  A 55 year old white male with prior history of anterior myocardial infarction in 2008, treated with multiple stents in the proximal to mid LAD.  He presents with recurrent unstable angina. He has a history of tobacco abuse and hyperlipidemia.  PROCEDURE:  Left heart catheterization, coronary and left ventricular angiography, and intracoronary stenting of the mid left circumflex coronary artery.  ACCESS:  Via the right radial artery using the standard Seldinger technique.  EQUIPMENT USED:  A 5-French 4-cm right Judkins catheter, 5-French 3.5-cm left Judkins catheter, 5-French pigtail catheter, 5-French arterial sheath, 6-French arterial sheath, 6-French left Voda 3.5 guide, a Prowater wire, a 2.5 x 12-mm Emerge balloon, a 3.0 x 20 mm PROMUS Element stent, a 3.25 x 15 mm Plymouth Trek balloon.  MEDICATIONS:  Local anesthesia 1% Xylocaine, Versed 2 mg IV, fentanyl 25 mcg IV, verapamil 3 mg intra-arterial, Plavix 150 mg p.o., Angiomax bolus at 0.75 mg/kg followed by continuous infusion of 1.75 mg/kg/hour. Subsequent ACT was 485 seconds, nitroglycerin 200 mcg intracoronary x1.  HEMODYNAMIC DATA:  Aortic pressure was 108/71 with a mean of 88 mmHg, left ventricular pressure is 107 with an EDP of 8 mmHg.  ANGIOGRAPHIC DATA:  The right coronary artery arises normally.  It is a small nondominant vessel.  It is normal.  The left main coronary artery is normal.  The left anterior descending artery is stented from the proximal to mid vessel.  The entire stented segment is widely patent with only 10-20% irregularities.  Just distal to the stent, there is a 40%  stenosis in the mid-LAD.  The left circumflex coronary artery is a large dominant vessel.  The first marginal branch appears normal.  The proximal circumflex appears normal.  There is a 30-40% focal narrowing immediately post the first marginal branch.  There is a segmental 90% stenosis in the midcircumflex.  Left ventricular angiography performed in the RAO view demonstrates anterolateral akinesis with apical severe hypokinesis.  Ejection fraction is estimated at 35%.  We proceeded with percutaneous intervention of the circumflex.  Using the above equipment, we were able to cross the lesion easily with a wire.  We predilated the lesion with a 2.5 x 12-mm Emerge balloon to 6 atmospheres.  At this point, there was abrupt occlusion of the vessel. We then placed a 3.0 x 20 mm PROMUS Element stent deploying this at 11 atmospheres.  This reestablished perfusion.  The stent was then postdilated with a 3.25 x 15-mm Adams Trek balloon to 14 atmospheres x2. This yielded an excellent angiographic result with 0% residual stenosis and TIMI grade 3 flow.  The patient was pain-free at this point.  FINAL INTERPRETATION: 1. Single-vessel obstructive coronary artery disease involving the mid     left circumflex coronary artery. 2. Continued patency of stents in the proximal to mid LAD. 3. Severe left ventricular dysfunction. 4. Successful  intracoronary stenting of the mid left circumflex     coronary artery with drug-eluting stents.          ______________________________ Trevor Trevor M. Griffin, M.D.     PMJ/MEDQ  D:  02/04/2011  T:  02/04/2011  Job:  [id number]  cc:   Trevor Ancona, MD  Electronically Signed by Trevor Griffin M.D. on 02/11/2011 01:02:59 PM

## 2011-02-13 NOTE — H&P (Signed)
NAMEMarland Kitchen  Trevor Griffin, Trevor Griffin NO.:  [id number]  MEDICAL RECORD NO.:  [id number]  LOCATION:  2807                         FACILITY:  [facility]  PHYSICIAN:  Arturo Morton. Riley Kill, MD, FACCDATE OF BIRTH:  Dec 13, 1955  DATE OF ADMISSION:  02/04/2011 DATE OF DISCHARGE:                             HISTORY & PHYSICAL   PRIMARY CARDIOLOGIST:  [name], MD.  PRIMARY CARE PROVIDER:  The patient does not have a primary care provider.  PATIENT PROFILE:  This 55 year old male with prior history of CAD status post anterior MI and multiple LAD stents and subsequent PTCA secondary to in-stent restenosis who presents with recurrent angina.  PROBLEMS: 1. Unstable angina/CAD.     a.     Status post catheterization in January 2006 revealing      nonobstructive CAD.     b.     Acute anterior ST elevation MI on Sep 21, 2006, with      catheterization revealing total occlusion of the proximal LAD.      Otherwise, had nonobstructive disease in the circumflex and RCA.      The LAD was felt to be the infarct vessel and was successfully      stented, a  total of four Taxus drug-eluting stents, all 3.0 mm in      diameter with length of 16, 16, 24, and 28 mm.     c.     June 11, 2007, recurrent anterior ST elevation MI with      catheterization revealing stent thrombosis in the LAD.  This was      treated with balloon angioplasty. 2. Ischemic cardiomyopathy.     a.     Cardiac MRI on November 07, 2009, EF 39% with 76%-99% thickness      subendocardial enhancement in the anteroseptal wall and mid apical      anterior wall suggestive of scar and lack of viability. 3. Ongoing EtOH abuse, currently drinking 8-12 beers per day. 4. Ongoing tobacco abuse with a 60 pack-year history who is currently     smoking two packs per day. 5. Hyperlipidemia. 6. Overweight. 7. Hypertension. 8. Depression.  ALLERGIES:  NO KNOWN DRUG ALLERGIES.  HISTORY OF PRESENT ILLNESS:  This 55 year old married Caucasian  male with the above complex problem list.  The patient's last intervention was in February 2009, when he had stent thrombosis related to not taking his Plavix, and this was subsequently balloon angioplastied (LAD).  The patient last saw Dr. Shirlee Latch in November 2011.  He had been doing well and continues to work without significant symptoms or limitations but, however, does continue to use tobacco and alcohol quite heavily.  Over the past week or so, the patient has noted a few episodes of exertional chest discomfort relieved with rest.  This morning at approximately 2:00 a.m., the patient awoke suddenly with 10/10 substernal chest pressure associated with nausea, diaphoresis, and dyspnea as well as radiation down bilateral arms and joint discomfort.  The patient took four baby aspirin after his wife called EMS with some relief.  Upon EMS arrival, the patient was taken to [facility] ED where he has been treated with nitroglycerin, morphine with eventual complete relief.  He did have some recurrence of the symptoms here in the ED, treated with nitroglycerin and morphine with relief.  He is currently pain-free.  His ECG shows no acute findings and so far his cardiac enzymes are negative.  HOME MEDICATIONS: 1. Spirolactone 25 mg daily. 2. Lisinopril 2.5 mg daily. 3. Nitroglycerin p.r.n. 4. Celexa 20 mg daily (says he is not taking simvastatin 80 mg at     bedtime). 5. Aspirin 81 mg daily. 6. Plavix 75 mg daily. 7. Bisoprolol 5 mg daily.  FAMILY HISTORY:  Father died of old age.  Mother has a history chronic kidney disease.  He has a sister who had cancer, another sister with hepatic failure.  SOCIAL HISTORY:  The patient lives in [place] with his wife.  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  He has a 60-pack-year history of tobacco abuse, currently smoking two packs per day.  He drinks 8-12 beers per day, and has done so for many years.  He denies drug use, does not routinely  exercise.  REVIEW OF SYSTEMS:  Positive for chest pain, dyspnea, cough.  Reports claudication as well as a headache.  The patient did have some pleuritic component to his chest discomfort as well as chest wall pain with coughing earlier this morning, however, these symptoms have all resolved.  He is a full code.  Otherwise, all systems reviewed are negative.  PHYSICAL EXAM:  VITAL SIGNS:  Temperature 97.4, heart rate 70, respirations 14, blood pressure 115/55, pulse ox 99% on 2 liters. GENERAL:  Pleasant white male, in no acute distress.  Awake, alert, oriented x3.  He has normal affect. HEENT:  Normal. NEUROLOGIC:  Grossly nonfocal. SKIN:  Warm and dry without lesions or masses. NECK:  Supple without bruits or JVD. LUNGS:  Respirations are unlabored with markedly diminished breath sounds at bilateral bases and throughout with faint occasional expiratory wheeze. CARDIAC:  Regular, S1 and S2, positive S4.  No murmurs. ABDOMEN:  Round, soft, nontender, nondistended.  Bowel sounds present. EXTREMITIES:  Upper extremities warm, dry, and pink.  No clubbing, cyanosis, or edema.  Dorsalis pedis and posterior tibial pulses are 2+ and equal bilaterally.  Chest x-ray shows no evidence of active pulmonary disease, normal heart size.  EKG is sinus rhythm of rate 87, normal axis, no acute ST-T changes.  Hemoglobin 16, hematocrit 47, WBC 3.9, platelets 155.  Sodium 135, potassium 4.3, chloride 101, CO2 of 23, BUN 12, creatinine 0.9, glucose 105, troponin 0.00.  ASSESSMENT AND PLAN: 1. Unstable angina/coronary artery disease.  The patient with chest     pain similar to prior angina, and he has had relief with aspirin,     nitroglycerin, and morphine.  Symptoms concerning for     angina/ischemia, currently pain-free.  Admit.  Cycle enzymes.  Add     heparin, nitropaste.  Continue aspirin, Plavix, beta blocker, Ace     inhibitor, and statin.  Plan cath today as schedule allows. 2.  Hyperlipidemia.  Check lipids and LFTs.  Continue statin therapy. 3. Hypertension, stable.  Continue home meds. 4. Ongoing tobacco abuse, cessation advised.  He will need additional     counseling and shows no motivation to     quit at this point. 5. Ethanol abuse.  We will write for CIWA protocol.  The patient would     likely benefit from provision of resources for alcohol cessation.     [name], ANP   ______________________________ Arturo Morton. Riley Kill, MD, [facility]  CB/MEDQ  D:  02/04/2011  T:  02/04/2011  Job:  [id number]  Electronically Signed by [name] ANP on 02/12/2011 07:13:06 PM Electronically Signed by Shawnie Pons MD [facility] on 02/13/2011 05:44:34 AM

## 2011-02-14 ENCOUNTER — Telehealth: Payer: Self-pay | Admitting: Cardiology

## 2011-02-14 NOTE — Telephone Encounter (Signed)
Pt called and having left arm pain/stent put in last week/pain since this am.  Call sent to triage.

## 2011-02-14 NOTE — Telephone Encounter (Signed)
Spoke with pt who states he has been having pain in left arm and shoulder blade today. Ongoing pain. No relief with Aleve. Recent stent. I instructed pt he should be taken to ED to be evaluated. He agrees with this plan.

## 2011-02-15 ENCOUNTER — Emergency Department (HOSPITAL_COMMUNITY)
Admission: EM | Admit: 2011-02-15 | Discharge: 2011-02-15 | Disposition: A | Discharge status: Home / Self Care | Payer: Managed Care, Other (non HMO) | Attending: Emergency Medicine | Admitting: Emergency Medicine

## 2011-02-15 DIAGNOSIS — I252 Old myocardial infarction: Secondary | ICD-10-CM | POA: Insufficient documentation

## 2011-02-15 DIAGNOSIS — I10 Essential (primary) hypertension: Secondary | ICD-10-CM | POA: Insufficient documentation

## 2011-02-15 DIAGNOSIS — F17200 Nicotine dependence, unspecified, uncomplicated: Secondary | ICD-10-CM | POA: Insufficient documentation

## 2011-02-15 DIAGNOSIS — F329 Major depressive disorder, single episode, unspecified: Secondary | ICD-10-CM | POA: Insufficient documentation

## 2011-02-15 DIAGNOSIS — M79609 Pain in unspecified limb: Secondary | ICD-10-CM | POA: Insufficient documentation

## 2011-02-15 DIAGNOSIS — F3289 Other specified depressive episodes: Secondary | ICD-10-CM | POA: Insufficient documentation

## 2011-02-24 ENCOUNTER — Encounter: Payer: Self-pay | Admitting: Physician Assistant

## 2011-02-24 NOTE — Discharge Summary (Signed)
  NAME:  BRIGIDO, MERA NO.:  [id number]  MEDICAL RECORD NO.:  [id number]  LOCATION:  2503                         FACILITY:  [facility]  PHYSICIAN:  Shron Ozer M. [place], M.D.  DATE OF BIRTH:  11/24/1955  DATE OF ADMISSION:  02/04/2011 DATE OF DISCHARGE:  02/06/2011                              DISCHARGE SUMMARY   ADDENDUM:  Please update medication list to show that #1.  The patient is not taking Celexa and this has been removed from his discharge list too.  The patient has been requested a prescription for Wellbutrin and forced smoking cessation and this can provided a prescription for Wellbutrin 150 mg one tablet daily x3 days, then one tablet p.o. b.i.d.     [name], ANP   ______________________________ Marciano Mundt M. [place], M.D.    CB/MEDQ  D:  02/06/2011  T:  02/06/2011  Job:  [id number]  Electronically Signed by [name] ANP on 02/12/2011 07:13:32 PM Electronically Signed by Addis Bennie [place] M.D. on 02/24/2011 02:55:49 PM

## 2011-02-24 NOTE — Discharge Summary (Signed)
NAME:  Trevor Griffin, Trevor Griffin NO.:  [id number]  MEDICAL RECORD NO.:  [id number]  LOCATION:  2503                         FACILITY:  [facility]  PHYSICIAN:  Caelin Rosen M. [place], M.D.  DATE OF BIRTH:  Nov 27, 1955  DATE OF ADMISSION:  02/04/2011 DATE OF DISCHARGE:  02/06/2011                              DISCHARGE SUMMARY   PRIMARY CARDIOLOGIST:  [name], MD  The patient does not have primary care provider.  DISCHARGE DIAGNOSIS:  Unstable angina.  SECONDARY DIAGNOSES: 1. Coronary artery disease status post anterior myocardial infarction     and left anterior descending stenting in 2008 with recurrent     myocardial infarction in 2009 secondary to thrombosis treated with     balloon angioplasty. 2. Ischemic cardiomyopathy, ejection fraction 35% by echo this     admission. 3. Ongoing ETOH abuse, currently drinking 8-12 beers per day. 4. Ongoing tobacco abuse with a 60-pack-year history, currently     smoking 2 packs per day. 5. Hyperlipidemia. 6. Overweight. 7. Hypertension. 8. Depression.  ALLERGIES:  No known drug allergies.  PROCEDURES: 1. Left heart cardiac catheterization performed February 04, 2011     revealing widely patent LAD stent in the proximal and mid section     arteries with only 10-20% irregularities.  There is  40% stenosis     in the mid LAD distal to the stent.  Circumflex had a 90% mid     stenosis.  RCA was small and nondominant without significant     stenosis.  The EF was 35% with anterolateral akinesis and apical     severe hypokinesis.  The left circumflex was stented with a 3.0 x     20-mm Promus Element Plus drug-eluting stent. 2. A 2-D echocardiogram performed February 05, 2011 showing an EF of 35%     with anteroseptal, severe hypokinesis to akinesis and anterior     hypokinesis with grade 1 diastolic dysfunction.  HISTORY OF PRESENT ILLNESS:  A 55 year old male with prior history of CAD status post anterior MI in May 2008 with 4 stents  placed in his LAD at that time (Taxus drug-eluting stent).  The patient apparently had noncompliance with his Plavix post procedurally in February and presented with recurrent anterior ST-elevation MI and catheterization revealed stent thrombosis.  This was successfully treated with balloon angioplasty.  The patient also has a history of ischemic cardiomyopathy with an EF 39% by prior cardiac MRI and he has been managed medically. Unfortunately the patient continues to smoke 2 packs a day and continues to drink 6-12 beers per day.  He was in his usual state of health. Approximately a week prior to admission, he began to experience intermittent exertional chest discomfort.  The morning of admission, he awoke suddenly with 10/10 substernal chest pressures with nausea, diaphoresis, dyspnea as well as radiation to bilateral arms.  The patient took 4 baby aspirins and then was taken to [facility] via EMS, treated with nitroglycerin and morphine with eventual relief.  ECG was nonacute and enzymes were negative.  He was admitted for further evaluation.  HOSPITAL COURSE:  The patient ruled out for MI.  He underwent diagnostic catheterization on the afternoon  of October 2 revealing a new 90% stenosis in the mid left circumflex and otherwise nonobstructive disease and patent stents in the LAD.  EF was 35%.  Attention was turned to the circumflex which was successfully stented with a 3.0 x 20-mm Promus Element Plus drug-eluting stent.  The patient tolerated this procedure well and on postprocedure did complain 1 episode of chest discomfort. He was treated with morphine, however, he requested Dilaudid.  There was some question as to drug-seeking behavior.  Secondary to his EtOH abuse, he was placed on a CIWA protocol which included oral Ativan therapy.  On this therapy, the patient was noted to be fairly somnolent and on the morning of October 3, he felt rather weak and tremulous.  For this reason  we kept him in the hospital 1 additional day.  A 2-D echocardiogram was completed showing an EF of 35% as outlined above.  He has been maintained on medical therapy including aspirin, Plavix, statin, beta-blocker, ACE inhibitors, and spironolactone therapy.  This morning he is more awake and ambulating without difficulty or recurrent symptoms.  We will discharge him home today in good condition.  DISCHARGE LABS:  Hemoglobin 14.3, hematocrit 40.6, WBC 5.2, platelets 138,000.  Sodium 133, potassium 4.0, chloride 98, CO2 28, BUN 10, creatinine 0.79, glucose 96, total bilirubin 0.5, alkaline phosphatase 86, AST 16, ALT 14, total protein 6.5, albumin 3.2, calcium 9.3, hemoglobin A1c 5.5, CK 7, MB 3.0, troponin I less than 0.30, total cholesterol 178, triglycerides 118, HDL 46, LDL 108.  TSH 4.397. Urinalysis negative.  DISPOSITION:  The patient will be discharged home today in good condition.  FOLLOWUP PLANS AND APPOINTMENTS:  We will arrange followup with Dr. Shirlee Latch in approximately 2 weeks.  DISCHARGE MEDICATIONS: 1. Lipitor 80 mg at bedtime (the patient previously on simvastatin 80,     however, LDL was 108).2. Lorazepam 0.5 mg q.8 hours p.r.n. 3. Multivitamin 1 tablet daily. 4. Nicotine 21 mg patch daily x6 weeks and 14 mg daily x2 weeks, then     7 mg daily x2 weeks. 5. Lisinopril 5 mg daily. 6. Nitroglycerin 0.4 mg p.r.n. chest pain. 7. Aspirin 81 mg daily. 8. Metoprolol 5 mg daily. 9. Celexa 20 mg daily. 10.Plavix 75 mg daily. 11.Spironolactone 25 mg 1/2 a tablet daily.  OUTSTANDING LABS AND STUDIES:  The patient will require followup lipids and LFTs in approximately 6 weeks.  DURATION OF DISCHARGE ENCOUNTER:  40 minutes including physician time.     Trevor Griffin, ANP   ______________________________ Trevor Griffin M. [place], M.D.    CB/MEDQ  D:  02/06/2011  T:  02/06/2011  Job:  [id number]  Electronically Signed by Trevor Griffin ANP on 02/12/2011 07:13:12  PM Electronically Signed by Grayton Lobo [place] M.D. on 02/24/2011 02:55:43 PM

## 2011-02-26 ENCOUNTER — Encounter: Payer: Self-pay | Admitting: Physician Assistant

## 2011-02-26 ENCOUNTER — Ambulatory Visit (INDEPENDENT_AMBULATORY_CARE_PROVIDER_SITE_OTHER): Payer: Managed Care, Other (non HMO) | Admitting: Physician Assistant

## 2011-02-26 VITALS — BP 124/80 | HR 76 | Resp 18 | Ht 68.0 in | Wt 190.0 lb

## 2011-02-26 DIAGNOSIS — F1010 Alcohol abuse, uncomplicated: Secondary | ICD-10-CM | POA: Insufficient documentation

## 2011-02-26 DIAGNOSIS — I2510 Atherosclerotic heart disease of native coronary artery without angina pectoris: Secondary | ICD-10-CM

## 2011-02-26 DIAGNOSIS — I428 Other cardiomyopathies: Secondary | ICD-10-CM

## 2011-02-26 DIAGNOSIS — F17200 Nicotine dependence, unspecified, uncomplicated: Secondary | ICD-10-CM

## 2011-02-26 DIAGNOSIS — E785 Hyperlipidemia, unspecified: Secondary | ICD-10-CM

## 2011-02-26 NOTE — Assessment & Plan Note (Signed)
EF 35% in the hospital.  Continue ACE, beta blocker, spironolactone.  No signs of CHF.

## 2011-02-26 NOTE — Assessment & Plan Note (Signed)
I congratulated him on quitting. 

## 2011-02-26 NOTE — Assessment & Plan Note (Signed)
We discussed the importance of etoh cessation from a cardiac perspective.  He is not interested in [place].

## 2011-02-26 NOTE — Progress Notes (Signed)
History of Present Illness: Primary Cardiologist:  Dr. [name]  [name] is a 55 y.o. male who presents for post hospital follow up.  He has a history of CAD s/p anterior STEMI in 5/08 with placement of 4 Taxus stents in the proximal LAD. 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 EF was borderline for ICD by echo and myoview, so cardiac MRI was done to quantify LV function. EF was found to be 39%.   Admitted 10/2-10/4 with chest pain c/w [place].  MI was r/o.  LHC demonstrated 90% stenosis in mCFX, patent stents in the LAD and nonobstructive disease elsewhere.  He was treated with PCI to the [facility] with a Promus DES.  EF was 35% by echo.  He was noted to have significant ETOH use prior to admission and was monitored on CIWA protocol.  Since discharge from the hospital, the patient denies chest pain, shortness of breath, syncope, orthopnea, PND or significant pedal edema.  He is asking for pain medications for his back.  He works at [employer] and does a lot of heavy lifting.  He has chronic back and hip pain.  He was to see Dr. Yetta Barre several months ago but never went to the appointment.  He is willing to reschedule now.  He quit smoking after discharge from the hospital.  Labs (6/11): LDL 148  Labs (7/11): HDL 47, LDL 73, K 4.4, creatinine 1.0, BNP 74  Labs (10/12): Hgb 14.3, K 4, creatinine 0.79, ALT 14, TC 178, TG 118, HDL 46, LDL 108, TSH 4.397 (hospital labs)   Past Medical History:  1. Hypertension  2. Gastroesophageal reflux disease  3. COPD: Active smoker, 1-1.5 ppd  4. CAD: Anterior STEMI 5/08 with 4 overlapping Taxus stents in the proximal to mid LAD. 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 Cath in 2/09 also showed 80% mid to distal CFX stenosis. Myoview (6/11): EF 37%, large anteroapical infarct with no ischemia.  [place] 10/12: LHC with prox to mid LAD stent ok with  10-20% ISR, mLAD 40%, CFX 30-40%, mCFX 90%, EF 35%.  PCI: Promus Element DES to mCFX (3x67mm). 5. LV systolic dysfunction: Echo (4/09) with EF 40-50%, Mild global hypokinesis, mild LVH. Echo (6/11): EF 40%, septal/apical akinesis, inferior hypokinesis, mild LVH. Cardiac MRI (7/11): EF 39%, anteroseptal and mid to apical anterior akinesis, scar in the anteroseptal and the mid to apical anterior wall (likely not viable).   Echo 02/05/11:  EF 35%, grade 1 diast dysfxn, AS Hypo to akinesis, Ant HK, mild LAE 6. Sleep study 6/08 was negative.  7. Hyperlipidemia  8. Obesity 9. Alcohol Abuse 10. Depression   Current Outpatient Prescriptions  Medication Sig Dispense Refill  . aspirin EC 81 MG tablet Take 81 mg by mouth daily.        Marland Kitchen atorvastatin (LIPITOR) 80 MG tablet Take 80 mg by mouth daily.        . bisoprolol (ZEBETA) 5 MG tablet Take 5 mg by mouth daily.        Marland Kitchen buPROPion (WELLBUTRIN SR) 150 MG 12 hr tablet Take 150 mg by mouth 2 (two) times daily.        . clopidogrel (PLAVIX) 75 MG tablet Take 75 mg by mouth daily.        Marland Kitchen lisinopril (PRINIVIL,ZESTRIL) 5 MG tablet Take 5 mg by mouth daily.        Marland Kitchen  LORazepam (ATIVAN) 0.5 MG tablet Take 0.5 mg by mouth every 8 (eight) hours as needed.        . Multiple Vitamins-Minerals (MULTIVITAMIN WITH MINERALS) tablet Take 1 tablet by mouth daily.        . nitroGLYCERIN (NITROSTAT) 0.4 MG SL tablet Place 0.4 mg under the tongue every 5 (five) minutes as needed.        Marland Kitchen spironolactone (ALDACTONE) 25 MG tablet Take 12.5 mg by mouth daily.          Allergies: No Known Allergies  Social History:  Married, lives in [place]. Works full time at [employer]. Drinks 6 beers several times a week. Quit smoking after 40 years in 10/12   Vital Signs: BP 124/80  Pulse 76  Resp 18  Ht 5\' 8"  (1.727 m)  Wt 190 lb (86.183 kg)  BMI 28.89 kg/m2  PHYSICAL EXAM: Well nourished, well developed, in no acute distress HEENT: normal Neck: no JVD Cardiac:  normal  S1, S2; RRR; no murmur Lungs: decreased breath sounds bilaterally, no wheezing, rhonchi or rales Abd: soft, nontender, no hepatomegaly Ext: no edema; right radial site without hematoma or bruit Skin: warm and dry Neuro:  CNs 2-12 intact, no focal abnormalities noted Psych:  C+ A- G- E- (score 1)  EKG:  NSR, HR 76, normal axis, PRWP, NSSTTW changes  ASSESSMENT AND PLAN:

## 2011-02-26 NOTE — Assessment & Plan Note (Signed)
He was changed to Lipitor in the hospital.  Check L/L in 6 weeks.

## 2011-02-26 NOTE — Patient Instructions (Addendum)
Your physician recommends that you schedule a follow-up appointment in: 2  Months with Dr Earlean Shawl have been referred to Dr Yetta Barre for chronic back pain Your physician recommends that you return for lab work in: 4weeks for lipid and liver test

## 2011-02-26 NOTE — Assessment & Plan Note (Signed)
Doing well post PCI.  Continue ASA and Plavix.  We discussed the importance of not stopping his Plavix.  We talked about increasing his activity.  He notes he is limited by back and hip pain and is asking for pain meds today.  I will arrange an appt with primary care so he can have his LBP evaluated.  I told him we cannot prescribe those types of pain medications here.

## 2011-03-26 ENCOUNTER — Other Ambulatory Visit (INDEPENDENT_AMBULATORY_CARE_PROVIDER_SITE_OTHER): Payer: Managed Care, Other (non HMO)

## 2011-03-26 ENCOUNTER — Other Ambulatory Visit: Payer: Self-pay | Admitting: Cardiology

## 2011-03-26 ENCOUNTER — Ambulatory Visit (INDEPENDENT_AMBULATORY_CARE_PROVIDER_SITE_OTHER): Payer: Managed Care, Other (non HMO) | Admitting: Internal Medicine

## 2011-03-26 ENCOUNTER — Encounter: Payer: Self-pay | Admitting: Internal Medicine

## 2011-03-26 ENCOUNTER — Other Ambulatory Visit: Payer: Self-pay | Admitting: *Deleted

## 2011-03-26 VITALS — BP 128/76 | HR 82 | Temp 98.3°F | Resp 16 | Ht 68.0 in | Wt 190.8 lb

## 2011-03-26 DIAGNOSIS — M545 Low back pain, unspecified: Secondary | ICD-10-CM

## 2011-03-26 DIAGNOSIS — Z23 Encounter for immunization: Secondary | ICD-10-CM

## 2011-03-26 DIAGNOSIS — M25559 Pain in unspecified hip: Secondary | ICD-10-CM

## 2011-03-26 DIAGNOSIS — M1990 Unspecified osteoarthritis, unspecified site: Secondary | ICD-10-CM | POA: Insufficient documentation

## 2011-03-26 DIAGNOSIS — E785 Hyperlipidemia, unspecified: Secondary | ICD-10-CM

## 2011-03-26 DIAGNOSIS — M25552 Pain in left hip: Secondary | ICD-10-CM

## 2011-03-26 DIAGNOSIS — F411 Generalized anxiety disorder: Secondary | ICD-10-CM

## 2011-03-26 DIAGNOSIS — F419 Anxiety disorder, unspecified: Secondary | ICD-10-CM

## 2011-03-26 LAB — HEPATIC FUNCTION PANEL
ALT: 25 U/L (ref 0–53)
AST: 29 U/L (ref 0–37)
Albumin: 4.2 g/dL (ref 3.5–5.2)
Alkaline Phosphatase: 103 U/L (ref 39–117)

## 2011-03-26 LAB — LIPID PANEL
HDL: 55.4 mg/dL (ref >39.00)
Triglycerides: 131 mg/dL (ref 0.0–149.0)

## 2011-03-26 MED ORDER — LORAZEPAM 0.5 MG PO TABS: 0.5000 mg | ORAL_TABLET | Freq: Three times a day (TID) | ORAL | Status: DC | PRN

## 2011-03-26 MED ORDER — TRAMADOL HCL 50 MG PO TABS: 50.0000 mg | ORAL_TABLET | Freq: Four times a day (QID) | ORAL | Status: AC | PRN

## 2011-03-26 MED ORDER — CELECOXIB 200 MG PO CAPS: 200.0000 mg | ORAL_CAPSULE | Freq: Every day | ORAL | Status: AC

## 2011-03-26 NOTE — Patient Instructions (Signed)
Back Pain, Adult Low back pain is very common. About 1 in 5 people have back pain.The cause of low back pain is rarely dangerous. The pain often gets better over time.About half of people with a sudden onset of back pain feel better in just 2 weeks. About 8 in 10 people feel better by 6 weeks.  CAUSES Some common causes of back pain include:  Strain of the muscles or ligaments supporting the spine.   Wear and tear (degeneration) of the spinal discs.   Arthritis.   Direct injury to the back.  DIAGNOSIS Most of the time, the direct cause of low back pain is not known.However, back pain can be treated effectively even when the exact cause of the pain is unknown.Answering your caregiver's questions about your overall health and symptoms is one of the most accurate ways to make sure the cause of your pain is not dangerous. If your caregiver needs more information, he or she may order lab work or imaging tests (X-rays or MRIs).However, even if imaging tests show changes in your back, this usually does not require surgery. HOME CARE INSTRUCTIONS For many people, back pain returns.Since low back pain is rarely dangerous, it is often a condition that people can learn to manageon their own.   Remain active. It is stressful on the back to sit or stand in one place. Do not sit, drive, or stand in one place for more than 30 minutes at a time. Take short walks on level surfaces as soon as pain allows.Try to increase the length of time you walk each day.   Do not stay in bed.Resting more than 1 or 2 days can delay your recovery.   Do not avoid exercise or work.Your body is made to move.It is not dangerous to be active, even though your back may hurt.Your back will likely heal faster if you return to being active before your pain is gone.   Pay attention to your body when you bend and lift. Many people have less discomfortwhen lifting if they bend their knees, keep the load close to their  bodies,and avoid twisting. Often, the most comfortable positions are those that put less stress on your recovering back.   Find a comfortable position to sleep. Use a firm mattress and lie on your side with your knees slightly bent. If you lie on your back, put a pillow under your knees.   Only take over-the-counter or prescription medicines as directed by your caregiver. Over-the-counter medicines to reduce pain and inflammation are often the most helpful.Your caregiver may prescribe muscle relaxant drugs.These medicines help dull your pain so you can more quickly return to your normal activities and healthy exercise.   Put ice on the injured area.   Put ice in a plastic bag.   Place a towel between your skin and the bag.   Leave the ice on for 15 to 20 minutes, 3 to 4 times a day for the first 2 to 3 days. After that, ice and heat may be alternated to reduce pain and spasms.   Ask your caregiver about trying back exercises and gentle massage. This may be of some benefit.   Avoid feeling anxious or stressed.Stress increases muscle tension and can worsen back pain.It is important to recognize when you are anxious or stressed and learn ways to manage it.Exercise is a great option.  SEEK MEDICAL CARE IF:  You have pain that is not relieved with rest or medicine.   You have   pain that does not improve in 1 week.   You have new symptoms.   You are generally not feeling well.  SEEK IMMEDIATE MEDICAL CARE IF:   You have pain that radiates from your back into your legs.   You develop new bowel or bladder control problems.   You have unusual weakness or numbness in your arms or legs.   You develop nausea or vomiting.   You develop abdominal pain.   You feel faint.  Document Released: 04/21/2005 Document Revised: 01/01/2011 Document Reviewed: 09/09/2010 [employer] Patient Information 2012 [employer]. 

## 2011-03-26 NOTE — Progress Notes (Signed)
Subjective:    Patient ID: Trevor Griffin, male    DOB: Sep 17, 1955, 55 y.o.   MRN: [id number]  Back Pain This is a new problem. Episode onset: 3 months ago. The problem occurs intermittently. The problem is unchanged. The pain is present in the lumbar spine. The quality of the pain is described as aching. The pain does not radiate. The pain is at a severity of 2/10. The pain is mild. The pain is worse during the day. The symptoms are aggravated by standing. Stiffness is present in the morning. Pertinent negatives include no abdominal pain, bladder incontinence, bowel incontinence, chest pain, dysuria, fever, headaches, leg pain, numbness, paresis, paresthesias, pelvic pain, perianal numbness, tingling, weakness or weight loss. Risk factors include lack of exercise, obesity and sedentary lifestyle. He has tried analgesics (wife's tramadol) for the symptoms. The treatment provided significant relief.  Hip Pain  The incident occurred more than 1 week ago. There was no injury mechanism. The pain is present in the left hip and right hip. The quality of the pain is described as aching. The pain is at a severity of 2/10. The pain is mild. The pain has been intermittent since onset. Pertinent negatives include no inability to bear weight, loss of motion, loss of sensation, muscle weakness, numbness or tingling. The symptoms are aggravated by movement. Treatments tried: wife's tramadol. The treatment provided significant relief.      Review of Systems  Constitutional: Negative for fever, chills, weight loss, diaphoresis, activity change, appetite change, fatigue and unexpected weight change.  HENT: Negative.   Eyes: Negative.   Respiratory: Negative for cough, shortness of breath, wheezing and stridor.   Cardiovascular: Negative for chest pain, palpitations and leg swelling.  Gastrointestinal: Negative for nausea, vomiting, abdominal pain, diarrhea, constipation, blood in stool, abdominal distention, anal  bleeding, rectal pain and bowel incontinence.  Genitourinary: Negative for bladder incontinence, dysuria, urgency, hematuria, decreased urine volume, difficulty urinating and pelvic pain.  Musculoskeletal: Positive for back pain and arthralgias (both hips). Negative for myalgias, joint swelling and gait problem.  Skin: Negative for color change, pallor, rash and wound.  Neurological: Negative for dizziness, tingling, tremors, seizures, syncope, facial asymmetry, speech difficulty, weakness, light-headedness, numbness, headaches and paresthesias.  Hematological: Negative for adenopathy. Does not bruise/bleed easily.  Psychiatric/Behavioral: Negative.        Objective:   Physical Exam  Vitals reviewed. Constitutional: He is oriented to person, place, and time. He appears well-developed and well-nourished. No distress.  HENT:  Head: Normocephalic and atraumatic.  Mouth/Throat: Oropharynx is clear and moist. No oropharyngeal exudate.  Eyes: Conjunctivae are normal. Right eye exhibits no discharge. Left eye exhibits no discharge. No scleral icterus.  Neck: Normal range of motion. Neck supple. No JVD present. No tracheal deviation present. No thyromegaly present.  Cardiovascular: Normal rate, regular rhythm, normal heart sounds and intact distal pulses.  Exam reveals no gallop and no friction rub.   No murmur heard. Pulmonary/Chest: Effort normal and breath sounds normal. No stridor. No respiratory distress. He has no wheezes. He has no rales. He exhibits no tenderness.  Abdominal: Soft. Bowel sounds are normal. He exhibits no distension and no mass. There is no tenderness. There is no rebound and no guarding.  Musculoskeletal: Normal range of motion. He exhibits no edema and no tenderness.       Right hip: Normal.       Left hip: Normal.       Lumbar back: Normal. He exhibits normal range of motion, no  tenderness, no bony tenderness, no swelling, no edema, no deformity, no laceration, no pain,  no spasm and normal pulse.  Lymphadenopathy:    He has no cervical adenopathy.  Neurological: He is oriented to person, place, and time. He has normal reflexes. He displays normal reflexes. No cranial nerve deficit. He exhibits normal muscle tone. Coordination normal.  Skin: Skin is warm and dry. No rash noted. He is not diaphoretic. No erythema. No pallor.  Psychiatric: He has a normal mood and affect. His behavior is normal. Judgment and thought content normal.      Lab Results  Component Value Date   WBC 5.2 02/06/2011   HGB 14.3 02/06/2011   HCT 40.6 02/06/2011   PLT 138* 02/06/2011   GLUCOSE 96 02/06/2011   CHOL 153 03/26/2011   TRIG 131.0 03/26/2011   HDL 55.40 03/26/2011   LDLDIRECT 148.4 10/18/2009   LDLCALC 71 03/26/2011   ALT 25 03/26/2011   AST 29 03/26/2011   NA 133* 02/06/2011   K 4.0 02/06/2011   CL 98 02/06/2011   CREATININE 0.79 02/06/2011   BUN 10 02/06/2011   CO2 28 02/06/2011   TSH 4.397 02/04/2011   INR 0.97 02/04/2011   HGBA1C 5.5 02/05/2011      Assessment & Plan:

## 2011-03-30 NOTE — Assessment & Plan Note (Signed)
I will check plain films today to look for DDD, mass, etc. Also will offer him celebrex and tramadol for pain relief

## 2011-03-30 NOTE — Assessment & Plan Note (Addendum)
I will check plain films today to look for DJD, AVN, etc His recent LFT's show no evidence of myopathy. And in the meantime I will offer him celebrex and tramadol for pain relief

## 2011-04-23 ENCOUNTER — Ambulatory Visit: Payer: Managed Care, Other (non HMO) | Admitting: Cardiology

## 2011-05-28 ENCOUNTER — Ambulatory Visit: Payer: Managed Care, Other (non HMO) | Admitting: Cardiology

## 2011-06-11 ENCOUNTER — Ambulatory Visit: Payer: Managed Care, Other (non HMO) | Admitting: Cardiology

## 2011-07-09 ENCOUNTER — Ambulatory Visit: Payer: Managed Care, Other (non HMO) | Admitting: Cardiology

## 2011-07-27 ENCOUNTER — Other Ambulatory Visit: Payer: Self-pay | Admitting: Cardiovascular Disease

## 2011-07-30 ENCOUNTER — Ambulatory Visit: Payer: Managed Care, Other (non HMO) | Admitting: Cardiology

## 2011-08-06 ENCOUNTER — Ambulatory Visit (INDEPENDENT_AMBULATORY_CARE_PROVIDER_SITE_OTHER): Payer: Managed Care, Other (non HMO) | Admitting: Physician Assistant

## 2011-08-06 ENCOUNTER — Telehealth: Payer: Self-pay | Admitting: *Deleted

## 2011-08-06 ENCOUNTER — Encounter: Payer: Self-pay | Admitting: Physician Assistant

## 2011-08-06 VITALS — BP 142/80 | Ht 68.0 in | Wt 189.0 lb

## 2011-08-06 DIAGNOSIS — I2589 Other forms of chronic ischemic heart disease: Secondary | ICD-10-CM

## 2011-08-06 DIAGNOSIS — R5383 Other fatigue: Secondary | ICD-10-CM

## 2011-08-06 DIAGNOSIS — I10 Essential (primary) hypertension: Secondary | ICD-10-CM

## 2011-08-06 DIAGNOSIS — R5381 Other malaise: Secondary | ICD-10-CM

## 2011-08-06 DIAGNOSIS — I2510 Atherosclerotic heart disease of native coronary artery without angina pectoris: Secondary | ICD-10-CM

## 2011-08-06 LAB — HEPATIC FUNCTION PANEL
Alkaline Phosphatase: 91 U/L (ref 39–117)
Bilirubin, Direct: 0.1 mg/dL (ref 0.0–0.3)
Total Bilirubin: 0.6 mg/dL (ref 0.3–1.2)
Total Protein: 7 g/dL (ref 6.0–8.3)

## 2011-08-06 LAB — BASIC METABOLIC PANEL
Calcium: 9.2 mg/dL (ref 8.4–10.5)
Creatinine, Ser: 0.9 mg/dL (ref 0.4–1.5)
GFR: 89.54 mL/min (ref >60.00)
Glucose, Bld: 82 mg/dL (ref 70–99)
Sodium: 135 mEq/L (ref 135–145)

## 2011-08-06 LAB — CBC WITH DIFFERENTIAL/PLATELET
Basophils Absolute: 0 10*3/uL (ref 0.0–0.1)
Eosinophils Absolute: 0.1 10*3/uL (ref 0.0–0.7)
Hemoglobin: 14.4 g/dL (ref 13.0–17.0)
Lymphocytes Relative: 26.4 % (ref 12.0–46.0)
Monocytes Relative: 12.6 % — ABNORMAL HIGH (ref 3.0–12.0)
Neutro Abs: 2.7 10*3/uL (ref 1.4–7.7)
Neutrophils Relative %: 59.5 % (ref 43.0–77.0)
RBC: 4.18 Mil/uL — ABNORMAL LOW (ref 4.22–5.81)
RDW: 13.7 % (ref 11.5–14.6)

## 2011-08-06 LAB — TSH: TSH: 0.61 u[IU]/mL (ref 0.35–5.50)

## 2011-08-06 NOTE — Telephone Encounter (Signed)
08/06/11--Spoke with pt. He agrees to echocardiogram and appt with Dr Shirlee Latch afterwards.

## 2011-08-06 NOTE — Patient Instructions (Signed)
Your physician recommends that you schedule a follow-up appointment in: 2 months with Dr Shirlee Latch Your physician recommends that you have lab work drawn today (BMP, CBC, TSH, Liver panel) Your physician has recommended that you have a sleep study. This test records several body functions during sleep, including: brain activity, eye movement, oxygen and carbon dioxide blood levels, heart rate and rhythm, breathing rate and rhythm, the flow of air through your mouth and nose, snoring, body muscle movements, and chest and belly movement.

## 2011-08-06 NOTE — Telephone Encounter (Signed)
Trevor Griffin, Trevor Griffin should go ahead and get another echo now, have him followup with me afterwards.  ----- Message ----- From: Beatrice Lecher, PA Sent: 08/06/2011 1:37 PM To: Laurey Morale, MD  [name] I saw him today for fatigue - probably has OSA. His EF was looked at in the past with MRI and it was > 35%. Last admxn was in 02/2011 and his EF was 35% by cath and echo. He had PCI at that time. He has not seen you in a while. I put him in for 2 mos follow up. Just wanted to touch base with you to see if you (1) want to get another echo or (2) send him to EP or (3) see him in 2 months and decide then. Thanks [employer]

## 2011-08-06 NOTE — Progress Notes (Signed)
[address] [place], [place]  [address] Phone: [phone] Fax:  [phone]  Date:  08/06/2011   Name:  Trevor Griffin       DOB:  [birth year] MRN:  [id number]  PCP:  Dr. Yetta Barre  Primary Cardiologist:  Dr. [name]  Primary Electrophysiologist:  None    History of Present Illness: Trevor Griffin is a 56 y.o. male who presents for fatigue.  He has a history of CAD s/p anterior STEMI in 5/08 with placement of 4 Taxus stents in the proximal LAD. 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 EF was borderline for ICD by echo and myoview, so cardiac MRI was done to quantify LV function. EF was found to be 39%.   Admitted 02/2011 with chest pain c/w [place]. MI was r/o. LHC demonstrated 90% stenosis in mCFX, patent stents in the LAD and nonobstructive disease elsewhere. He was treated with PCI to the [facility] with a Promus DES. EF was 35% by echo.   He presents today with complaints of fatigue for the last one to 2 months.  He has poor sleep habits.  Yawns a lot during the day. His wife notes that he has significant snoring and she has witnessed apneic episodes.  She is convinced that he has sleep apnea.  He was tested 2008 and this was neg.  He has dyspnea with more extreme activities.  He denies orthopnea, PND or edema.  He denies chest discomfort, syncope, palps.  Past Medical History:  1. Hypertension  2. Gastroesophageal reflux disease  3. COPD: Active smoker, 1-1.5 ppd  4. CAD: Anterior STEMI 5/08 with 4 overlapping Taxus stents in the proximal to mid LAD. 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 Cath in 2/09 also showed 80% mid to distal CFX stenosis. Myoview (6/11): EF 37%, large anteroapical infarct with no ischemia. [place] 10/12: LHC with prox to mid LAD stent ok with 10-20% ISR, mLAD 40%, CFX 30-40%, mCFX 90%, EF 35%. PCI: Promus Element DES to  mCFX (3x22mm).  5. LV systolic dysfunction: Echo (4/09) with EF 40-50%, Mild global hypokinesis, mild LVH. Echo (6/11): EF 40%, septal/apical akinesis, inferior hypokinesis, mild LVH. Cardiac MRI (7/11): EF 39%, anteroseptal and mid to apical anterior akinesis, scar in the anteroseptal and the mid to apical anterior wall (likely not viable). Echo 02/05/11: EF 35%, grade 1 diast dysfxn, AS Hypo to akinesis, Ant HK, mild LAE  6. Sleep study 6/08 was negative.  7. Hyperlipidemia  8. Obesity  9. Alcohol Abuse  10. Depression   Past Medical History  Diagnosis Date  . Smoker   . Hyperlipidemia   . Ischemic cardiomyopathy     Echo (4/09) with EF 40-50%, Mild global hypokinesis, mild LVH. Echo (6/11): EF 40%, septal/apical akinesis, inferior hypokinesis, mild LVH. Cardiac MRI (7/11): EF 39%, anteroseptal and mid to apical anterior akinesis, scar in the anteroseptal and the mid to apical anterior wall (likely not viable). Echo 02/05/11: EF 35%, grade 1 diast dysfxn, AS Hypo to akinesis, Ant HK, mild LAE   . Hypertension   . Obesity   . Depression   . CAD (coronary artery disease)     Ant STEMI 5/08 with 4 Taxus DES proximal to mid LAD. 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 Cath in 2/09  also showed 80% mid to distal CFX stenosis. Myoview (6/11): EF 37%, large anteroapical infarct with no ischemia. [place] 10/12: LHC with prox to mid LAD stent ok with 10-20% ISR, mLAD 40%, CFX 30-40%, mCFX 90%, EF 35% - DES to mCFX  . GERD (gastroesophageal reflux disease)   . COPD (chronic obstructive pulmonary disease)   . Alcohol abuse   . Fatigue     sleep test neg 2008    Current Outpatient Prescriptions  Medication Sig Dispense Refill  . aspirin EC 81 MG tablet Take 81 mg by mouth daily.        Marland Kitchen atorvastatin (LIPITOR) 80 MG tablet Take 80 mg by mouth daily.        . bisoprolol (ZEBETA) 5 MG tablet Take 5 mg by mouth daily.        Marland Kitchen buPROPion  (WELLBUTRIN SR) 150 MG 12 hr tablet Take 150 mg by mouth 2 (two) times daily.        . celecoxib (CELEBREX) 200 MG capsule Take 1 capsule (200 mg total) by mouth daily.  15 capsule  0  . clopidogrel (PLAVIX) 75 MG tablet TAKE 1 TABLET EVERY DAY  30 tablet  2  . lisinopril (PRINIVIL,ZESTRIL) 5 MG tablet Take 1 tablet (5 mg total) by mouth daily.  90 tablet  2  . LORazepam (ATIVAN) 0.5 MG tablet Take 1 tablet (0.5 mg total) by mouth every 8 (eight) hours as needed.  30 tablet  5  . Multiple Vitamins-Minerals (MULTIVITAMIN WITH MINERALS) tablet Take 1 tablet by mouth daily.        . nitroGLYCERIN (NITROSTAT) 0.4 MG SL tablet Place 0.4 mg under the tongue every 5 (five) minutes as needed.        Marland Kitchen spironolactone (ALDACTONE) 25 MG tablet Take 12.5 mg by mouth daily.        . traMADol (ULTRAM) 50 MG tablet Take 1 tablet (50 mg total) by mouth every 6 (six) hours as needed for pain. Maximum dose= 8 tablets per day  100 tablet  1  . DISCONTD: lisinopril (PRINIVIL,ZESTRIL) 5 MG tablet Take 5 mg by mouth daily.          Allergies: No Known Allergies  History  Substance Use Topics  . Smoking status: Former Smoker -- 2.0 packs/day    Types: Cigarettes    Quit date: 02/03/2011  . Smokeless tobacco: Not on file  . Alcohol Use: Yes     8-12 BEERS DAILY     ROS:  Please see the history of present illness.   No fevers, cough, melena, hematochezia, hematuria, weight loss, night sweats.  All other systems reviewed and negative.   PHYSICAL EXAM: VS:  BP 142/80  Ht 5\' 8"  (1.727 m)  Wt 189 lb (85.73 kg)  BMI 28.74 kg/m2 Well nourished, well developed, in no acute distress HEENT: normal Neck: no JVD Cardiac:  normal S1, S2; RRR; no murmur Lungs:  Decreased breath sounds bilaterally, no wheezing, rhonchi or rales Abd: soft, nontender, no hepatomegaly Ext: no edema Skin: warm and dry Neuro:  CNs 2-12 intact, no focal abnormalities noted Psych: normal affect   EKG:  NSR, HR 81, normal axis, PRWP,  non specific ST changes  ASSESSMENT AND PLAN:  1. Fatigue  I suspect he has OSA.  Will refer for sleep testing.  If +, he will need to see pulmonology.  I will also check BMET, CBC, TSH, LFTs.  We discussed the importance of cutting back on his ETOH.  I also warned him about using sleep aides along with continued ETOH abuse.     2. CAD  No angina.  Continue ASA and Plavix.  Follow up with Dr. [name] in 2 mos.    3. Hypertension  Usually better controlled.  Continue current Rx for now.   4. Ischemic Cardiomyopathy  Volume stable.  Continue beta blocker, ACE, spironolactone.  Follow up with Dr. [name] in 2 mos.      Signed, Tereso Newcomer, PA-C  11:56 AM 08/06/2011

## 2011-08-07 ENCOUNTER — Telehealth: Payer: Self-pay | Admitting: *Deleted

## 2011-08-07 NOTE — Telephone Encounter (Signed)
Message copied by Tarri Fuller on Thu Aug 07, 2011  9:10 AM ------      Message from: [place], [place] T      Created: Wed Aug 06, 2011  5:17 PM       Please notify patient that the lab results are ok.      Tereso Newcomer, PA-C  5:17 PM 08/06/2011

## 2011-08-07 NOTE — Telephone Encounter (Signed)
s/w pt's wife and she has been notified of lab results today and gave verbal understanding said she will let the pt know about results. Danielle Rankin

## 2011-08-20 ENCOUNTER — Ambulatory Visit (HOSPITAL_COMMUNITY): Payer: Managed Care, Other (non HMO) | Attending: Cardiology

## 2011-08-20 ENCOUNTER — Other Ambulatory Visit: Payer: Self-pay

## 2011-08-20 DIAGNOSIS — I2510 Atherosclerotic heart disease of native coronary artery without angina pectoris: Secondary | ICD-10-CM | POA: Insufficient documentation

## 2011-08-20 DIAGNOSIS — R5381 Other malaise: Secondary | ICD-10-CM | POA: Insufficient documentation

## 2011-08-20 DIAGNOSIS — R5383 Other fatigue: Secondary | ICD-10-CM | POA: Insufficient documentation

## 2011-08-20 DIAGNOSIS — I10 Essential (primary) hypertension: Secondary | ICD-10-CM | POA: Insufficient documentation

## 2011-08-20 DIAGNOSIS — E785 Hyperlipidemia, unspecified: Secondary | ICD-10-CM | POA: Insufficient documentation

## 2011-08-20 DIAGNOSIS — I517 Cardiomegaly: Secondary | ICD-10-CM | POA: Insufficient documentation

## 2011-08-20 DIAGNOSIS — J449 Chronic obstructive pulmonary disease, unspecified: Secondary | ICD-10-CM | POA: Insufficient documentation

## 2011-08-20 DIAGNOSIS — F17200 Nicotine dependence, unspecified, uncomplicated: Secondary | ICD-10-CM | POA: Insufficient documentation

## 2011-08-20 DIAGNOSIS — I2589 Other forms of chronic ischemic heart disease: Secondary | ICD-10-CM | POA: Insufficient documentation

## 2011-08-20 DIAGNOSIS — I059 Rheumatic mitral valve disease, unspecified: Secondary | ICD-10-CM | POA: Insufficient documentation

## 2011-08-20 DIAGNOSIS — I359 Nonrheumatic aortic valve disorder, unspecified: Secondary | ICD-10-CM

## 2011-08-20 DIAGNOSIS — J4489 Other specified chronic obstructive pulmonary disease: Secondary | ICD-10-CM | POA: Insufficient documentation

## 2011-08-25 ENCOUNTER — Observation Stay (HOSPITAL_COMMUNITY)
Admission: EM | Admit: 2011-08-25 | Discharge: 2011-08-26 | Disposition: A | Discharge status: Home / Self Care | Payer: Managed Care, Other (non HMO) | Source: Ambulatory Visit | Attending: Cardiovascular Disease | Admitting: Cardiovascular Disease

## 2011-08-25 ENCOUNTER — Encounter (HOSPITAL_COMMUNITY): Payer: Self-pay

## 2011-08-25 ENCOUNTER — Emergency Department (HOSPITAL_COMMUNITY): Payer: Managed Care, Other (non HMO)

## 2011-08-25 ENCOUNTER — Telehealth: Payer: Self-pay | Admitting: Cardiology

## 2011-08-25 DIAGNOSIS — R079 Chest pain, unspecified: Secondary | ICD-10-CM

## 2011-08-25 DIAGNOSIS — R0789 Other chest pain: Principal | ICD-10-CM | POA: Insufficient documentation

## 2011-08-25 DIAGNOSIS — E7800 Pure hypercholesterolemia, unspecified: Secondary | ICD-10-CM

## 2011-08-25 DIAGNOSIS — K219 Gastro-esophageal reflux disease without esophagitis: Secondary | ICD-10-CM | POA: Insufficient documentation

## 2011-08-25 DIAGNOSIS — R5381 Other malaise: Secondary | ICD-10-CM | POA: Insufficient documentation

## 2011-08-25 DIAGNOSIS — F419 Anxiety disorder, unspecified: Secondary | ICD-10-CM

## 2011-08-25 DIAGNOSIS — M545 Low back pain: Secondary | ICD-10-CM

## 2011-08-25 DIAGNOSIS — M25551 Pain in right hip: Secondary | ICD-10-CM

## 2011-08-25 DIAGNOSIS — I10 Essential (primary) hypertension: Secondary | ICD-10-CM | POA: Diagnosis present

## 2011-08-25 DIAGNOSIS — Z9861 Coronary angioplasty status: Secondary | ICD-10-CM | POA: Insufficient documentation

## 2011-08-25 DIAGNOSIS — I2589 Other forms of chronic ischemic heart disease: Secondary | ICD-10-CM | POA: Insufficient documentation

## 2011-08-25 DIAGNOSIS — F1010 Alcohol abuse, uncomplicated: Secondary | ICD-10-CM | POA: Insufficient documentation

## 2011-08-25 DIAGNOSIS — F329 Major depressive disorder, single episode, unspecified: Secondary | ICD-10-CM | POA: Insufficient documentation

## 2011-08-25 DIAGNOSIS — J4489 Other specified chronic obstructive pulmonary disease: Secondary | ICD-10-CM | POA: Insufficient documentation

## 2011-08-25 DIAGNOSIS — J449 Chronic obstructive pulmonary disease, unspecified: Secondary | ICD-10-CM | POA: Insufficient documentation

## 2011-08-25 DIAGNOSIS — E785 Hyperlipidemia, unspecified: Secondary | ICD-10-CM | POA: Insufficient documentation

## 2011-08-25 DIAGNOSIS — I2510 Atherosclerotic heart disease of native coronary artery without angina pectoris: Secondary | ICD-10-CM | POA: Insufficient documentation

## 2011-08-25 DIAGNOSIS — F3289 Other specified depressive episodes: Secondary | ICD-10-CM | POA: Insufficient documentation

## 2011-08-25 DIAGNOSIS — I252 Old myocardial infarction: Secondary | ICD-10-CM | POA: Insufficient documentation

## 2011-08-25 LAB — CARDIAC PANEL(CRET KIN+CKTOT+MB+TROPI)
Relative Index: INVALID (ref 0.0–2.5)
Total CK: 86 U/L (ref 7–232)
Troponin I: <0.3 ng/mL (ref <0.30)
Troponin I: <0.3 ng/mL (ref <0.30)

## 2011-08-25 LAB — BASIC METABOLIC PANEL
BUN: 12 mg/dL (ref 6–23)
CO2: 26 mEq/L (ref 19–32)
Calcium: 9.2 mg/dL (ref 8.4–10.5)
Creatinine, Ser: 0.84 mg/dL (ref 0.50–1.35)
GFR calc non Af Amer: >90 mL/min (ref >90)
Glucose, Bld: 88 mg/dL (ref 70–99)

## 2011-08-25 LAB — TROPONIN I: Troponin I: <0.3 ng/mL (ref <0.30)

## 2011-08-25 LAB — CBC
HCT: 42.2 % (ref 39.0–52.0)
Hemoglobin: 15 g/dL (ref 13.0–17.0)
MCH: 34.5 pg — ABNORMAL HIGH (ref 26.0–34.0)
MCHC: 35.5 g/dL (ref 30.0–36.0)
MCV: 97 fL (ref 78.0–100.0)
RBC: 4.35 MIL/uL (ref 4.22–5.81)

## 2011-08-25 MED ORDER — BISOPROLOL FUMARATE 5 MG PO TABS
5.0000 mg | ORAL_TABLET | ORAL | Status: DC
  Filled 2011-08-25: qty 1

## 2011-08-25 MED ORDER — HEPARIN (PORCINE) IN NACL 100-0.45 UNIT/ML-% IJ SOLN
1100.0000 [IU]/h | INTRAMUSCULAR | Status: DC
  Filled 2011-08-25: qty 250

## 2011-08-25 MED ORDER — MORPHINE SULFATE 4 MG/ML IJ SOLN
4.0000 mg | Freq: Once | INTRAMUSCULAR | Status: AC
  Administered 2011-08-25: 4 mg via INTRAVENOUS
  Filled 2011-08-25: qty 1

## 2011-08-25 MED ORDER — SODIUM CHLORIDE 0.9 % IV SOLN: 250.0000 mL | INTRAVENOUS | Status: DC | PRN

## 2011-08-25 MED ORDER — ADULT MULTIVITAMIN W/MINERALS CH
1.0000 | ORAL_TABLET | Freq: Every day | ORAL | Status: DC
  Administered 2011-08-26: 1 via ORAL
  Filled 2011-08-25: qty 1

## 2011-08-25 MED ORDER — NITROGLYCERIN IN D5W 200-5 MCG/ML-% IV SOLN
10.0000 ug/min | INTRAVENOUS | Status: DC
  Administered 2011-08-25: 10 ug/min via INTRAVENOUS
  Filled 2011-08-25: qty 250

## 2011-08-25 MED ORDER — ASPIRIN 81 MG PO CHEW
162.0000 mg | CHEWABLE_TABLET | Freq: Once | ORAL | Status: AC
  Administered 2011-08-25: 162 mg via ORAL
  Filled 2011-08-25: qty 2

## 2011-08-25 MED ORDER — SODIUM CHLORIDE 0.9 % IJ SOLN: 3.0000 mL | INTRAMUSCULAR | Status: DC | PRN

## 2011-08-25 MED ORDER — ADULT MULTIVITAMIN W/MINERALS CH
1.0000 | ORAL_TABLET | Freq: Every day | ORAL | Status: DC
  Administered 2011-08-25: 1 via ORAL
  Filled 2011-08-25 (×2): qty 1

## 2011-08-25 MED ORDER — LISINOPRIL 5 MG PO TABS
5.0000 mg | ORAL_TABLET | Freq: Every day | ORAL | Status: DC
  Administered 2011-08-26: 5 mg via ORAL
  Filled 2011-08-25: qty 1

## 2011-08-25 MED ORDER — ONDANSETRON HCL 4 MG/2ML IJ SOLN
4.0000 mg | Freq: Once | INTRAMUSCULAR | Status: AC
  Administered 2011-08-25: 4 mg via INTRAVENOUS
  Filled 2011-08-25: qty 2

## 2011-08-25 MED ORDER — ONDANSETRON HCL 4 MG/2ML IJ SOLN: 4.0000 mg | Freq: Four times a day (QID) | INTRAMUSCULAR | Status: DC | PRN

## 2011-08-25 MED ORDER — BUPROPION HCL ER (SR) 150 MG PO TB12
150.0000 mg | ORAL_TABLET | Freq: Two times a day (BID) | ORAL | Status: DC
  Administered 2011-08-26: 150 mg via ORAL
  Filled 2011-08-25 (×5): qty 1

## 2011-08-25 MED ORDER — NITROGLYCERIN 2 % TD OINT
TOPICAL_OINTMENT | TRANSDERMAL | Status: AC
  Administered 2011-08-25: 0.5 [in_us] via TOPICAL
  Filled 2011-08-25: qty 1

## 2011-08-25 MED ORDER — CLOPIDOGREL BISULFATE 75 MG PO TABS
75.0000 mg | ORAL_TABLET | Freq: Every day | ORAL | Status: DC
  Administered 2011-08-26: 75 mg via ORAL
  Filled 2011-08-25: qty 1

## 2011-08-25 MED ORDER — BISOPROLOL FUMARATE 5 MG PO TABS
5.0000 mg | ORAL_TABLET | Freq: Every day | ORAL | Status: DC
  Administered 2011-08-26: 5 mg via ORAL
  Filled 2011-08-25: qty 1

## 2011-08-25 MED ORDER — SODIUM CHLORIDE 0.9 % IV BOLUS (SEPSIS)
1000.0000 mL | Freq: Once | INTRAVENOUS | Status: AC
  Administered 2011-08-25: 1000 mL via INTRAVENOUS

## 2011-08-25 MED ORDER — CELECOXIB 200 MG PO CAPS
200.0000 mg | ORAL_CAPSULE | Freq: Every day | ORAL | Status: DC
  Administered 2011-08-26: 200 mg via ORAL
  Filled 2011-08-25: qty 1

## 2011-08-25 MED ORDER — ASPIRIN EC 81 MG PO TBEC
81.0000 mg | DELAYED_RELEASE_TABLET | Freq: Every day | ORAL | Status: DC
  Administered 2011-08-26: 81 mg via ORAL
  Filled 2011-08-25 (×2): qty 1

## 2011-08-25 MED ORDER — ATORVASTATIN CALCIUM 80 MG PO TABS
80.0000 mg | ORAL_TABLET | ORAL | Status: AC
  Administered 2011-08-25: 80 mg via ORAL
  Filled 2011-08-25: qty 1

## 2011-08-25 MED ORDER — TRAMADOL HCL 50 MG PO TABS
50.0000 mg | ORAL_TABLET | Freq: Four times a day (QID) | ORAL | Status: DC | PRN
  Administered 2011-08-25 – 2011-08-26 (×2): 50 mg via ORAL
  Filled 2011-08-25 (×2): qty 1

## 2011-08-25 MED ORDER — LISINOPRIL 5 MG PO TABS
5.0000 mg | ORAL_TABLET | ORAL | Status: DC
  Filled 2011-08-25: qty 1

## 2011-08-25 MED ORDER — HEPARIN BOLUS VIA INFUSION: 4000.0000 [IU] | Freq: Once | INTRAVENOUS | Status: DC

## 2011-08-25 MED ORDER — CELECOXIB 200 MG PO CAPS
200.0000 mg | ORAL_CAPSULE | ORAL | Status: AC
  Administered 2011-08-25: 200 mg via ORAL
  Filled 2011-08-25 (×2): qty 1

## 2011-08-25 MED ORDER — BUPROPION HCL ER (SR) 150 MG PO TB12
150.0000 mg | ORAL_TABLET | ORAL | Status: AC
  Administered 2011-08-25: 150 mg via ORAL
  Filled 2011-08-25: qty 1

## 2011-08-25 MED ORDER — NITROGLYCERIN 2 % TD OINT
0.5000 [in_us] | TOPICAL_OINTMENT | Freq: Four times a day (QID) | TRANSDERMAL | Status: DC
  Administered 2011-08-25 – 2011-08-26 (×2): 0.5 [in_us] via TOPICAL
  Filled 2011-08-25: qty 30

## 2011-08-25 MED ORDER — NITROGLYCERIN 0.4 MG SL SUBL
0.4000 mg | SUBLINGUAL_TABLET | SUBLINGUAL | Status: DC | PRN
  Administered 2011-08-25 (×2): 0.4 mg via SUBLINGUAL
  Filled 2011-08-25: qty 75

## 2011-08-25 MED ORDER — ZOLPIDEM TARTRATE 5 MG PO TABS: 10.0000 mg | ORAL_TABLET | Freq: Every evening | ORAL | Status: DC | PRN

## 2011-08-25 MED ORDER — ATORVASTATIN CALCIUM 80 MG PO TABS
80.0000 mg | ORAL_TABLET | Freq: Every day | ORAL | Status: DC
Start: 2011-08-25 — End: 2011-08-26
  Administered 2011-08-26: 80 mg via ORAL
  Filled 2011-08-25 (×2): qty 1

## 2011-08-25 MED ORDER — SPIRONOLACTONE 12.5 MG HALF TABLET
12.5000 mg | ORAL_TABLET | Freq: Every day | ORAL | Status: DC
Start: 2011-08-25 — End: 2011-08-26
  Administered 2011-08-26: 12.5 mg via ORAL
  Filled 2011-08-25 (×2): qty 1

## 2011-08-25 MED ORDER — SODIUM CHLORIDE 0.9 % IJ SOLN
3.0000 mL | Freq: Two times a day (BID) | INTRAMUSCULAR | Status: DC
  Administered 2011-08-26 (×2): 3 mL via INTRAVENOUS

## 2011-08-25 MED ORDER — ACETAMINOPHEN 325 MG PO TABS
650.0000 mg | ORAL_TABLET | ORAL | Status: DC | PRN
  Administered 2011-08-25 – 2011-08-26 (×3): 650 mg via ORAL
  Filled 2011-08-25 (×4): qty 2

## 2011-08-25 MED ORDER — LORAZEPAM 0.5 MG PO TABS
0.5000 mg | ORAL_TABLET | Freq: Three times a day (TID) | ORAL | Status: DC | PRN
  Administered 2011-08-25 – 2011-08-26 (×2): 0.5 mg via ORAL
  Filled 2011-08-25 (×2): qty 1

## 2011-08-25 NOTE — ED Notes (Signed)
[id number] Ready 

## 2011-08-25 NOTE — ED Notes (Signed)
Patient resting and remains on monitor with NAD at this time.

## 2011-08-25 NOTE — Telephone Encounter (Signed)
  Patient complaining of active chest pains, SOB, no dizziness since Saturday.  BP 158/85 pulse 103.  Patient said he is concerned as he is having the chest pains in the same area as last time when he had a blocked artery.  Patient gave return call number as [phone].

## 2011-08-25 NOTE — ED Provider Notes (Signed)
History     CSN: [id number]  Arrival date & time 08/25/11  1006   First MD Initiated Contact with Patient 08/25/11 1018      Chief Complaint  Patient presents with  . Chest Pain    (Consider location/radiation/quality/duration/timing/severity/associated sxs/prior treatment) HPI  Cardiologist- [place] with [facility]  Pt presents to the ED with complaints of chest pain intermittently for 3 days. He called his cardiologist this morning who told him to come to the ED. The patient has a history of stent placement and states that this pain feels the same. He recently had an Echo done and was told that his heart had improvement since the last test that was done. His pain is under the left breast. The quality is sharp and intermittent, it worsens with palpation. His pain is currently a 4/10 pain.   Past Medical History  Diagnosis Date  . Smoker   . Hyperlipidemia   . Ischemic cardiomyopathy     Echo (4/09) with EF 40-50%, Mild global hypokinesis, mild LVH. Echo (6/11): EF 40%, septal/apical akinesis, inferior hypokinesis, mild LVH. Cardiac MRI (7/11): EF 39%, anteroseptal and mid to apical anterior akinesis, scar in the anteroseptal and the mid to apical anterior wall (likely not viable). Echo 02/05/11: EF 35%, grade 1 diast dysfxn, AS Hypo to akinesis, Ant HK, mild LAE   . Hypertension   . Obesity   . Depression   . CAD (coronary artery disease)     Ant STEMI 5/08 with 4 Taxus DES proximal to mid LAD. 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 Cath in 2/09 also showed 80% mid to distal CFX stenosis. Myoview (6/11): EF 37%, large anteroapical infarct with no ischemia. [place] 10/12: LHC with prox to mid LAD stent ok with 10-20% ISR, mLAD 40%, CFX 30-40%, mCFX 90%, EF 35% - DES to mCFX  . GERD (gastroesophageal reflux disease)   . COPD (chronic obstructive pulmonary disease)   . Alcohol abuse   . Fatigue     sleep test neg 2008     Past Surgical History  Procedure Date  . Cardiac catheterization     ANTERIOR STEMI W/4  OVERLAPPING TAXUS STENTS IN THE PROXIMAL TO MID LAD    Family History  Problem Relation Age of Onset  . Kidney disease Mother     CHRONIC  . Cancer Sister   . Liver disease Sister     LIVER FAILURE    History  Substance Use Topics  . Smoking status: Former Smoker -- 2.0 packs/day    Types: Cigarettes    Quit date: 02/03/2011  . Smokeless tobacco: Not on file  . Alcohol Use: Yes     8-12 BEERS DAILY      Review of Systems  Allergies  Review of patient's allergies indicates no known allergies.  Home Medications   Current Outpatient Rx  Name Route Sig Dispense Refill  . ACETAMINOPHEN 325 MG PO TABS Oral Take 650 mg by mouth daily as needed. For pain    . ASPIRIN EC 81 MG PO TBEC Oral Take 81 mg by mouth daily.      . ATORVASTATIN CALCIUM 80 MG PO TABS Oral Take 80 mg by mouth daily.      Marland Kitchen BISOPROLOL FUMARATE 5 MG PO TABS Oral Take 5 mg by mouth daily.      . BUPROPION HCL ER (SR) 150 MG PO TB12 Oral Take 150 mg by mouth 2 (  two) times daily.      . CELECOXIB 200 MG PO CAPS Oral Take 1 capsule (200 mg total) by mouth daily. 15 capsule 0  . CLOPIDOGREL BISULFATE 75 MG PO TABS Oral Take 75 mg by mouth daily.    Marland Kitchen LISINOPRIL 5 MG PO TABS Oral Take 1 tablet (5 mg total) by mouth daily. 90 tablet 2  . LORAZEPAM 0.5 MG PO TABS Oral Take 1 tablet (0.5 mg total) by mouth every 8 (eight) hours as needed. 30 tablet 5  . MULTI-VITAMIN/MINERALS PO TABS Oral Take 1 tablet by mouth daily.      Marland Kitchen NITROGLYCERIN 0.4 MG SL SUBL Sublingual Place 0.4 mg under the tongue every 5 (five) minutes as needed. For chest pain    . SPIRONOLACTONE 25 MG PO TABS Oral Take 12.5 mg by mouth daily.      . TRAMADOL HCL 50 MG PO TABS Oral Take 1 tablet (50 mg total) by mouth every 6 (six) hours as needed for pain. Maximum dose= 8 tablets per day 100 tablet 1    BP 117/75  Pulse 79  Resp 14  SpO2  99%  Physical Exam  Nursing note and vitals reviewed. Constitutional: He appears well-developed and well-nourished. No distress.  HENT:  Head: Normocephalic and atraumatic.  Eyes: Pupils are equal, round, and reactive to light.  Neck: Normal range of motion. Neck supple.  Cardiovascular: Normal rate and regular rhythm.   Pulmonary/Chest: Effort normal.  Abdominal: Soft.  Neurological: He is alert.  Skin: Skin is warm and dry.    ED Course  Procedures (including critical care time)  Labs Reviewed  CBC - Abnormal; Notable for the following:    MCH 34.5 (*)    All other components within normal limits  BASIC METABOLIC PANEL - Abnormal; Notable for the following:    Sodium 134 (*)    All other components within normal limits  TROPONIN I   Dg Chest 2 View  08/25/2011  *RADIOLOGY REPORT*  Clinical Data: Left chest pain.  CHEST - 2 VIEW  Comparison: 02/04/2011.  Findings: Normal sized heart.  Clear lungs.  Mild diffuse peribronchial thickening without significant change.  Minimal scoliosis.  IMPRESSION: Mild chronic bronchitic changes.  No acute abnormality.  Original Report Authenticated By: Darrol Angel, M.D.     1. Chest pain       MDM  Pt is pain free after two nitro tabs. Given Morphine for residual headache due to the medication. He has states that he has felt two twinges in his chest but they subsided. Due to patient having stent placement, I have asked [facility] to consult on patient,     Date: 08/25/2011  Rate: 84  Rhythm: normal sinus rhythm  QRS Axis: normal  Intervals: normal  ST/T Wave abnormalities: normal  Conduction Disutrbances:none  Narrative Interpretation:   Old EKG Reviewed: unchanged from Feb 06, 2011      Dorthula Matas, [place] 08/25/11 1212  1;48PM: patient waiting for cardiology to consult and began having constant chest pain again. Dr. Effie Shy evaluated patient and he has been started on a heparin drip. Still awaiting cardiology  consult.  Dorthula Matas, PA 08/25/11 1349

## 2011-08-25 NOTE — ED Notes (Signed)
Patient states he is having left side chest pain x 3 days and states the pain is the same as he had in October 2012 when he had a stent placed. Patient states SOB and denies N/V/D/F.  EKG captured on arrival to ED and patient placed on monitor and 2L oxygen with sats of 98%. Patient's wife is parking car and will be brought to room.

## 2011-08-25 NOTE — ED Notes (Signed)
Patient resting with NAD at this time. Remains on monitor and reports no chest pain or headache.

## 2011-08-25 NOTE — Progress Notes (Signed)
ANTICOAGULATION CONSULT NOTE - Initial Consult  Pharmacy Consult for Heparin Indication: chest pain/ACS  No Known Allergies  Patient Measurements: Height: 5\' 8"  (172.7 cm) Weight: 185 lb (83.915 kg) IBW/kg (Calculated) : 68.4  Heparin Dosing Weight: 84 kg  Vital Signs: BP: 120/73 mmHg (04/22 1300) Pulse Rate: 70  (04/22 1300)  Labs:  Basename 08/25/11 1035  HGB 15.0  HCT 42.2  PLT 163  APTT --  LABPROT --  INR --  HEPARINUNFRC --  CREATININE 0.84  CKTOTAL --  CKMB --  TROPONINI <0.30   Estimated Creatinine Clearance: 104.8 ml/min (by C-G formula based on Cr of 0.84).  Medical History: Past Medical History  Diagnosis Date  . Smoker   . Hyperlipidemia   . Ischemic cardiomyopathy     Echo (4/09) with EF 40-50%, Mild global hypokinesis, mild LVH. Echo (6/11): EF 40%, septal/apical akinesis, inferior hypokinesis, mild LVH. Cardiac MRI (7/11): EF 39%, anteroseptal and mid to apical anterior akinesis, scar in the anteroseptal and the mid to apical anterior wall (likely not viable). Echo 02/05/11: EF 35%, grade 1 diast dysfxn, AS Hypo to akinesis, Ant HK, mild LAE   . Hypertension   . Obesity   . Depression   . CAD (coronary artery disease)     Ant STEMI 5/08 with 4 Taxus DES proximal to mid LAD. 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 Cath in 2/09 also showed 80% mid to distal CFX stenosis. Myoview (6/11): EF 37%, large anteroapical infarct with no ischemia. [place] 10/12: LHC with prox to mid LAD stent ok with 10-20% ISR, mLAD 40%, CFX 30-40%, mCFX 90%, EF 35% - DES to mCFX  . GERD (gastroesophageal reflux disease)   . COPD (chronic obstructive pulmonary disease)   . Alcohol abuse   . Fatigue     sleep test neg 2008    Assessment: 56 y.o. M with hx of prior PCI and now to start heparin for ACS sx while awaiting further evaluation and results of cardiac enzymes. The patient has had no recent surgeries and was on  ASA/Plavix PTA--however no other oral anticoagulants. Wt: 84 kg, Hgb/Hct/Plt ok.   Goal of Therapy:  Heparin level 0.3-0.7 units/ml   Plan:  1. Heparin bolus of 4000 units x 1 2. Initiate heparin at drip rate of 1100 units/hr (11 ml/hr) 3. Daily heparin levels starting on 4/23 4. Will continue to monitor for any signs/symptoms of bleeding and will follow up with heparin level in 6 hours   Georgina Pillion, PharmD, BCPS Clinical Pharmacist Pager: [phone] 08/25/2011 2:00 PM

## 2011-08-25 NOTE — Telephone Encounter (Signed)
Call was directly transferred to triage. Pt c/o cp for several days, was going to go to work this am when cp started again, left chest and states this feels the same as 2008 and 2009 when he had his MI's. Pt advised to call 911, pt declined and stated he would have his wife drive him to [facility]. I explained reasons not to have his wife drive but pt feels he is ok to go by car against advise. Informed him I will call ER to expect him

## 2011-08-25 NOTE — ED Notes (Signed)
Patient remains on monitor and 2L oxygen with satsa of 100%. Patient denies headache or chest pain at this time. Patient states he stills feels slight SOB. Family at bedside.

## 2011-08-25 NOTE — ED Notes (Signed)
Lt. Side chest pain began on Saturday, intermittent, non -radiating, Pt. States, "It feels like the same pain I had when I got a stent".  Pt. Reports having diziness and sob.

## 2011-08-25 NOTE — ED Notes (Signed)
Patient restless and c/o of headache and chest pain.

## 2011-08-25 NOTE — ED Notes (Signed)
Patient returned from xray and remain on monitor and 2L oxygen with sats of 99. Patient report chest pain of 4 out of 10 and SOB.

## 2011-08-25 NOTE — ED Notes (Signed)
Patient transported to X-ray 

## 2011-08-25 NOTE — ED Provider Notes (Signed)
The patient has had chest pain. Each day for 3 days. There is no particular provocation and nothing has helped. Today, he came to the ED because of the persistent pain. He has seen his doctor recently and had a recommendation to get a sleep apnea study done for nocturnal dyspnea. He last had a coronary stent less than one year ago. He took an 81 mg aspirin at 6 AM. He had transient relief of chest pain with 2 sublingual nitroglycerin in ED, but the pain has returned. He has component of chest wall pain. A recent cardiac echo that showed an ejection fraction of less than 40. Exam heart has regular rate and rhythm. Lungs have decreased air movement bilaterally. The chest is mild left anterior tenderness. Abdomen is soft and nontender..  EKG is not ischemic. It is unchanged from prior.  The patient had recurrent chest pain, and the emergency department and was started on heparin, and nitroglycerin drips by me. Her 162 mg of aspirin to complete a 324 mg. Cardiology contacted regarding the new diagnosis of acute coronary syndrome.  CRITICAL CARE Performed by: Flint Melter   Total critical care time: 35  Critical care time was exclusive of separately billable procedures and treating other patients.  Critical care was necessary to treat or prevent imminent or life-threatening deterioration.  Critical care was time spent personally by me on the following activities: development of treatment plan with patient and/or surrogate as well as nursing, discussions with consultants, evaluation of patient's response to treatment, examination of patient, obtaining history from patient or surrogate, ordering and performing treatments and interventions, ordering and review of laboratory studies, ordering and review of radiographic studies, pulse oximetry and re-evaluation of patient's condition.  Flint Melter, MD 08/25/11 1406

## 2011-08-25 NOTE — ED Notes (Signed)
Report called to Wardell Honour, RN on [phone]. Trevor Griffin  States  The patient needs admit orders and tely orders and ask if nitro can be dc'd.

## 2011-08-25 NOTE — H&P (Signed)
History and Physical  Patient ID: Trevor Griffin MRN: [id number], SOB: 12/14/1955 56 y.o. Date of Encounter: 08/25/2011, 1:43 PM  Primary Physician: Sanda Linger, MD Primary Cardiologist: Dr. Shirlee Latch  Chief Complaint: Chest pain  HPI: 56 y.o. male w/ PMHx significant for CAD s/p PCIs, h/o LV dysfunction, HTN, HLD, Obesity, Alcohol abuse, h/oTobacco abuse, and COPD who presented to [facility] on 08/25/2011 with complaints of chest pain.  CAD s/p anterior STEMI in 5/08 with placement of 4 Taxus stents in the proximal LAD. 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 EF was borderline for ICD by echo and myoview, so cardiac MRI was done to quantify LV function. EF was found to be 39%. Admitted 02/2011 with chest pain c/w [place]. MI was r/o. LHC demonstrated 90% stenosis in mCFX, patent stents in the LAD and nonobstructive disease elsewhere. He was treated with PCI to the [facility] with a Promus DES. EF was 35% by echo.   Was seen in clinic on 08/06/11 by Tereso Newcomer, PA-C w/ c/o fatigue for which he was referred for sleep study w/ concerns of sleep apnea (was to be done tomorrow 08/26/11). He was also scheduled for an echo to reassess LV function which was completed on 08/20/11 and showed EF 50-55%, septal & apical hypokinesis, mild AI, and mildly dilated LA. He reports working on Saturday at [employer] together when he felt sharp twinges in his left upper chest. No radiation, 5/10, a/w mild sob and dizziness. The pain lasted for a few seconds but occurred intermittently throughout the day. The frequency increased yesterday and continued until this morning when he called the office and was told to come to the ED for evaluation. The pain is sometimes worse with movement of left arm. It feels similar to the pain he had prior to MI in 2008. He reports taking all his medications as prescribed. Denies diaphoresis, nausea, or syncope. No recent  illness, fever, chills, edema, orthopnea, PND, abd pain, change in bladder or bowels. He has had increased fatigue since February and has been told his snoring and apneic spells have increased at night. He is not very active other than at work. He drinks about 6 12oz beers per day and required ativan during his last hospitalization. Quit smoking Oct '12.  In the ED, EKG revealed NSR with no acute ST/T changes. CXR was without acute cardiopulmonary abnormalities. Initial troponin negative. He received ASA and 2 SL NTG with some relief in chest pain and morphine for headache which further helped relieve chest pain.   Past Medical History  Diagnosis Date  . Smoker   . Hyperlipidemia   . Ischemic cardiomyopathy     Echo (4/09) with EF 40-50%, Mild global hypokinesis, mild LVH. Echo (6/11): EF 40%, septal/apical akinesis, inferior hypokinesis, mild LVH. Cardiac MRI (7/11): EF 39%, anteroseptal and mid to apical anterior akinesis, scar in the anteroseptal and the mid to apical anterior wall (likely not viable). Echo 02/05/11: EF 35%, grade 1 diast dysfxn, AS Hypo to akinesis, Ant HK, mild LAE   . Hypertension   . Obesity   . Depression   . CAD (coronary artery disease)     Ant STEMI 5/08 with 4 Taxus DES proximal to mid LAD. 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 Cath in 2/09 also showed 80% mid to distal CFX stenosis. Myoview (6/11):  EF 37%, large anteroapical infarct with no ischemia. [place] 10/12: LHC with prox to mid LAD stent ok with 10-20% ISR, mLAD 40%, CFX 30-40%, mCFX 90%, EF 35% - DES to mCFX  . GERD (gastroesophageal reflux disease)   . COPD (chronic obstructive pulmonary disease)   . Alcohol abuse   . Fatigue     sleep test neg 2008    08/20/11 - 2D Echocardiogram Study Conclusions:  - Left ventricle: Septal and apical hypokinesis The cavity size was normal. Wall thickness was normal. Systolic function was normal. The estimated  ejection fraction was in the range of 50% to 55%.  - Aortic valve: Mild regurgitation. - Left atrium: The atrium was mildly dilated. - Atrial septum: No defect or patent foramen ovale was identified.  Surgical History:  Past Surgical History  Procedure Date  . Cardiac catheterization     ANTERIOR STEMI W/4  OVERLAPPING TAXUS STENTS IN THE PROXIMAL TO MID LAD     Home Meds: Medication Sig  acetaminophen (TYLENOL) 325 MG tablet Take 650 mg by mouth daily as needed. For pain  aspirin EC 81 MG tablet Take 81 mg by mouth daily.    atorvastatin (LIPITOR) 80 MG tablet Take 80 mg by mouth daily.    bisoprolol (ZEBETA) 5 MG tablet Take 5 mg by mouth daily.    buPROPion (WELLBUTRIN SR) 150 MG 12 hr tablet Take 150 mg by mouth 2 (two) times daily.    celecoxib (CELEBREX) 200 MG capsule Take 1 capsule (200 mg total) by mouth daily.  clopidogrel (PLAVIX) 75 MG tablet Take 75 mg by mouth daily.  lisinopril (PRINIVIL,ZESTRIL) 5 MG tablet Take 1 tablet (5 mg total) by mouth daily.  LORazepam (ATIVAN) 0.5 MG tablet Take 1 tablet (0.5 mg total) by mouth every 8 (eight) hours as needed.  Multiple Vitamins-Minerals (MULTIVITAMIN WITH MINERALS) tablet Take 1 tablet by mouth daily.    nitroGLYCERIN (NITROSTAT) 0.4 MG SL tablet Place 0.4 mg under the tongue every 5 (five) minutes as needed. For chest pain  spironolactone (ALDACTONE) 25 MG tablet Take 12.5 mg by mouth daily.    traMADol (ULTRAM) 50 MG tablet Take 1 tablet (50 mg total) by mouth every 6 (six) hours as needed for pain. Maximum dose= 8 tablets per day    Allergies: No Known Allergies   Social History  . Marital Status: Married   Occupational History  . [employer] employee   Social History Main Topics  . Smoking status: Former Smoker -- 2.0 packs/day    Types: Cigarettes    Quit date: 02/03/2011  . Alcohol Use: Yes     8-12 BEERS DAILY  . Drug Use: No  . Sexually Active: Yes    Social History Narrative   Alcoholic beverage:  Yes     Drug use:  No     Seatbead Use: Yes     Firearms in home:  Yes      Exercise:  No          Smoke Alarm in your home: Yes     Family History  Problem Relation Age of Onset  . Kidney disease Mother     CHRONIC  . Cancer Sister   . Liver disease Sister     LIVER FAILURE    Review of Systems: General: (+) Fatigue; negative for chills, fever, night sweats or weight changes.  Cardiovascular:  (+) chest pain, shortness of breath, dyspnea on exertion; negative for edema, orthopnea, palpitations, paroxysmal nocturnal dyspnea  Dermatological: negative  for rash Respiratory: negative for cough or wheezing Urologic: negative for hematuria Abdominal: negative for nausea, vomiting, diarrhea, bright red blood per rectum, melena, or hematemesis Neurologic: negative for visual changes, syncope All other systems reviewed and are otherwise negative except as noted above.  Labs:   Component Value Date   WBC 4.8 08/25/2011   HGB 15.0 08/25/2011   HCT 42.2 08/25/2011   MCV 97.0 08/25/2011   PLT 163 08/25/2011    Lab 08/25/11 1035  NA 134*  K 4.0  CL 98  CO2 26  BUN 12  CREATININE 0.84  CALCIUM 9.2  GLUCOSE 88   Basename 08/25/11 1035  TROPONINI <0.30    Radiology/Studies:   08/25/2011 - CXR Findings: Normal sized heart.  Clear lungs.  Mild diffuse peribronchial thickening without significant change.  Minimal scoliosis.  IMPRESSION: Mild chronic bronchitic changes.  No acute abnormality.   EKG: 08/25/11 @ 1009 - NSR 84bpm no acute ST/T changes  Physical Exam: Blood pressure 120/73, pulse 70, resp. rate 20, SpO2 98.00%. General: Well developed, well nourished, middle aged white male in no acute distress. Head: Normocephalic, atraumatic, sclera non-icteric, nares are without discharge Neck: Supple. Negative for carotid bruits. JVD not elevated. Lungs: Clear bilaterally to auscultation without wheezes, rales, or rhonchi. Breathing is unlabored. Heart: RRR with S1 S2. No murmurs, rubs, or  gallops appreciated. Abdomen: Soft, non-tender, non-distended with normoactive bowel sounds. No rebound/guarding. No obvious abdominal masses. Msk:  Strength and tone appear normal for age. Extremities: No edema. No clubbing or cyanosis. Distal pedal pulses are 2+ and equal bilaterally. Neuro: Alert and oriented X 3. Moves all extremities spontaneously. Psych:  Responds to questions appropriately with a normal affect.    ASSESSMENT AND PLAN:  56 y.o. male w/ PMHx significant for CAD s/p PCIs, h/o LV dysfunction, HTN, HLD, Alcohol abuse, h/o Tobacco abuse, and COPD who presented to [facility] on 08/25/2011 with complaints of chest pain.  1. Chest Pain: Atypical chest pain, although he says it is the same pain he felt prior to MI in 2008. He has had 2 days of chest pain and has negative troponins and unchanged EKG. CXR without acute findings. Afebrile with normal WBC. Has some chest wall tenderness. Suspect musculoskeletal in nature. Has 4/10 chest pain now, NTG drip started by ED. Will admit for overnight observation to cycle enzymes and monitor EKG. Cont NTG drip and titrate to chest pain. No heparin as his enzymes are negative. Cont ASA, plavix, BB, statin, ACEI. If enzymes turn positive, keep NPO and start heparin drip with plans for further ischemic eval tomorrow. If remain negative will likely be dc'd tomorrow.  2. CAD: H/o stents x4 to LAD in 2008 and DES to LCx Oct '12. Don't suspect ACS. Cont meds as above.  3. H/o LV Dysfunction: EF 35% by echo Oct '12. Now 50-55% by echo 08/20/11. Euvolemic on exam. Cont aldactone, BB, and ACEI  4. Alcohol Abuse: Drinks 6-10 beers a day. Will need to be placed on CIWA protocol.   5. HTN: Stable. Meds as above.  6. HLD: LDL 71 in Nov '12. Cont statin. Lipid panel in am.  Signed, HOPE, JESSICA PA-C 08/25/2011, 1:43 PM  Attending Note:   The patient was seen and examined.  Agree with assessment and plan as noted above.  Patient has a hx of  significant CAD .  Presents today with 3 days of atypical CP - but similar to his previous CP according to patient.  Enzymes are  negative so far.  He had some wheezing on exam today.  - chronic problem for his.  Hx of COPD.  Quit smoking years ago.  Check cardiac enzymes.  I suspect he can go home tomorrow if enzymes are negative.   Vesta Mixer, Montez Hageman., MD, [facility] 08/25/2011, 3:32 PM

## 2011-08-26 ENCOUNTER — Telehealth: Payer: Self-pay | Admitting: Cardiology

## 2011-08-26 ENCOUNTER — Encounter (HOSPITAL_BASED_OUTPATIENT_CLINIC_OR_DEPARTMENT_OTHER): Payer: Managed Care, Other (non HMO)

## 2011-08-26 DIAGNOSIS — R079 Chest pain, unspecified: Secondary | ICD-10-CM

## 2011-08-26 LAB — COMPREHENSIVE METABOLIC PANEL
ALT: 10 U/L (ref 0–53)
Alkaline Phosphatase: 77 U/L (ref 39–117)
CO2: 26 mEq/L (ref 19–32)
GFR calc Af Amer: >90 mL/min (ref >90)
GFR calc non Af Amer: 82 mL/min — ABNORMAL LOW (ref >90)
Glucose, Bld: 86 mg/dL (ref 70–99)
Potassium: 4.6 mEq/L (ref 3.5–5.1)
Sodium: 133 mEq/L — ABNORMAL LOW (ref 135–145)
Total Bilirubin: 0.3 mg/dL (ref 0.3–1.2)

## 2011-08-26 LAB — CARDIAC PANEL(CRET KIN+CKTOT+MB+TROPI)
Relative Index: INVALID (ref 0.0–2.5)
Troponin I: <0.3 ng/mL (ref <0.30)

## 2011-08-26 LAB — CBC
Hemoglobin: 13.1 g/dL (ref 13.0–17.0)
RBC: 3.89 MIL/uL — ABNORMAL LOW (ref 4.22–5.81)

## 2011-08-26 NOTE — Discharge Summary (Signed)
See progress notes Trevor Griffin  

## 2011-08-26 NOTE — Discharge Summary (Signed)
Discharge Summary   Patient ID: Trevor Griffin MRN: [id number], DOB/AGE: 56/23/1957 56 y.o.  Primary MD: Sanda Linger, MD Primary Cardiologist: Dr. Shirlee Latch  Admit date: 08/25/2011 D/C date:     08/26/2011     Primary Discharge Diagnoses:  1. Chest pain, atypical  - ? Musculoskeletal   - negative cardiac enzymes, no acute EKG or CXR findings   - Outpatient exercise myoview   - F/u w/ Dr. Shirlee Latch 2. Fatigue  - ? R/t OSA. Outpt sleep study was scheduled prior to this admission  Secondary Discharge Diagnoses:  1. CAD - Ant STEMI 5/08 with 4 Taxus DES proximal to mid LAD. 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 Cath in 2/09 also showed 80% mid to distal CFX stenosis. Myoview (6/11): EF 37%, large anteroapical infarct with no ischemia. [place] 10/12: LHC with prox to mid LAD stent ok with 10-20% ISR, mLAD 40%, CFX 30-40%, mCFX 90%, EF 35% - DES to mCFX  2. H/o Ischemic CM - Echo 08/20/11 w/ EF 50-55%, septal & apical hypokinesis (Previously 35%) 3. HLD 4. HTN 5. H/o Tobacco abuse - Quit Oct '12 6. Alcohol abuse 7. GERD 8. COPD 9. Depression  Allergies No Known Allergies  Diagnostic Studies/Procedures:  None  History of Present Illness: 56 y.o. male w/ PMHx significant for CAD s/p PCIs, h/o LV dysfunction, HTN, HLD, Obesity, Alcohol abuse, h/oTobacco abuse, and COPD who presented to [facility] on 08/25/2011 with complaints of chest pain  Was seen in clinic on 08/06/11 by Tereso Newcomer, PA-C w/ c/o fatigue for which he was referred for sleep study w/ concerns of sleep apnea (scheduled for 08/26/11). He was also scheduled for an echo to reassess LV function which was completed on 08/20/11 and showed EF 50-55%, septal & apical hypokinesis, mild AI, and mildly dilated LA. He reports working on Saturday at [employer] together when he felt sharp twinges in his left upper chest. No radiation, 5/10, a/w mild sob and  dizziness. The pain lasted for a few seconds but occurred intermittently throughout the day. The frequency increased Sudnday and continued until the morning of presentation when he called the office and was told to come to the ED for evaluation.   Hospital Course: In the ED, EKG revealed NSR with no acute ST/T changes. CXR was without acute cardiopulmonary abnormalities. Initial troponin negative. He received ASA and 2 SL NTG with some relief in chest pain and morphine for headache which further helped relieve chest pain. He was placed on an NTG drip and admitted for overnight observation. Due to his alcohol abuse he was monitored on the CIWA protocol. Cardiac enzymes were cycled and remained negative. He had no further episodes of chest pain. Recommendations were made for outpatient exercise myoview and follow up with Dr. Shirlee Latch. The patient also stated he was going to reschedule his sleep study because he did not want to do it tonight.  He was seen and evaluated by Dr. Jens Som who felt he was stable for discharge home with plans for follow up as scheduled below.  Discharge Vitals: Blood pressure 110/66, pulse 67, temperature 97.6 F (36.4 C), temperature source Oral, resp. rate 18, height 5\' 8"  (1.727 m), weight 189 lb 6 oz (85.9 kg), SpO2 100.00%.  Labs: Component Value Date   WBC 4.1 08/26/2011   HGB 13.1 08/26/2011   HCT 38.2* 08/26/2011   MCV 98.2 08/26/2011   PLT 136*  08/26/2011    Lab 08/26/11 0301  NA 133*  K 4.6  CL 100  CO2 26  BUN 13  CREATININE 1.01  CALCIUM 8.7  PROT 5.8*  BILITOT 0.3  ALKPHOS 77  ALT 10  AST 14  GLUCOSE 86   Basename 08/26/11 0301 08/25/11 2046 08/25/11 1502 08/25/11 1353  CKTOTAL 79 86 94 --  CKMB 2.3 2.3 2.2 --  TROPONINI <0.30 <0.30 <0.30 <0.30   Component Value Date   CHOL 158 08/26/2011   HDL 40 08/26/2011   LDLCALC 94 08/26/2011   TRIG 118 08/26/2011    Discharge Medications   Medication List  As of 08/26/2011 12:10 PM   TAKE these  medications         aspirin EC 81 MG tablet   Take 81 mg by mouth daily.      atorvastatin 80 MG tablet   Commonly known as: LIPITOR   Take 80 mg by mouth daily.      bisoprolol 5 MG tablet   Commonly known as: ZEBETA   Take 5 mg by mouth daily.      buPROPion 150 MG 12 hr tablet   Commonly known as: WELLBUTRIN SR   Take 150 mg by mouth 2 (two) times daily.      celecoxib 200 MG capsule   Commonly known as: CELEBREX   Take 1 capsule (200 mg total) by mouth daily.      clopidogrel 75 MG tablet   Commonly known as: PLAVIX   Take 75 mg by mouth daily.      lisinopril 5 MG tablet   Commonly known as: PRINIVIL,ZESTRIL   Take 1 tablet (5 mg total) by mouth daily.      LORazepam 0.5 MG tablet   Commonly known as: ATIVAN   Take 1 tablet (0.5 mg total) by mouth every 8 (eight) hours as needed.      multivitamin with minerals tablet   Take 1 tablet by mouth daily.      nitroGLYCERIN 0.4 MG SL tablet   Commonly known as: NITROSTAT   Place 0.4 mg under the tongue every 5 (five) minutes as needed. For chest pain      spironolactone 25 MG tablet   Commonly known as: ALDACTONE   Take 12.5 mg by mouth daily.      traMADol 50 MG tablet   Commonly known as: ULTRAM   Take 1 tablet (50 mg total) by mouth every 6 (six) hours as needed for pain. Maximum dose= 8 tablets per day      TYLENOL 325 MG tablet   Generic drug: acetaminophen   Take 650 mg by mouth daily as needed. For pain            Disposition   Discharge Orders    Future Appointments: Provider: Department: Dept Phone: Center:   09/01/2011 12:00 PM [facility]  None   09/05/2011 10:45 AM Laurey Morale, MD [facility] [phone] [facility]     Future Orders Please Complete By Expires   Diet - low sodium heart healthy      Increase activity slowly      Discharge instructions      Comments:   **PLEASE REMEMBER TO BRING ALL OF YOUR MEDICATIONS TO EACH OF YOUR  FOLLOW-UP OFFICE VISITS.   * You are schedule for an exercise stress test. Our office will call you with further instructions. If the date/time scheduled does not work for  you please call our office to reschedule.  * Please contact the appropriate office to reschedule your sleep study.     Follow-up Information    Follow up with [facility] on 09/01/2011. (Exercise stress test @ 12:00)    Contact information:   [facility] [address] [place] [address] [phone]      Follow up with [name], MD on 09/05/2011. (10:45)    Contact information:   [facility] [address] [place] [address] [phone]           Outstanding Labs/Studies:  1. Exercise myoview 2. Sleep study  Duration of Discharge Encounter: Greater than 30 minutes including physician and PA time.  Signed, Mirtha Jain PA-C 08/26/2011, 12:10 PM

## 2011-08-26 NOTE — Telephone Encounter (Signed)
Spoke with pt. Pt was told today when discharged from hospital to remain out of work until pt has myoview completed and sees Dr Shirlee Latch in office 09/05/11. At pt's request I will mail pt a note  stating that he should remain out of work until Sep 05, 2011. I will also send a message to the PCCs to reschedule sleep study that was scheduled for tonight. Pt is requesting sleep study be rescheduled to April 30,2013.

## 2011-08-26 NOTE — Progress Notes (Signed)
DC orders received.  Patient stable with no S/S of distress.  Medication and discharge information reviewed with patient. Patient DC home with wife. Nolon Nations

## 2011-08-26 NOTE — Telephone Encounter (Signed)
Pt was admitted for chest pain yesterday, all test's normal, came home this am, wants to know when he can go back to work and needs a note , pt wants to know if we can rs his sleep disorder test as well, pls call

## 2011-08-26 NOTE — Progress Notes (Signed)
@   Subjective:  Denies CP or dyspnea   Objective:  Filed Vitals:   08/25/11 1915 08/25/11 2010 08/26/11 0500 08/26/11 0940  BP: 101/50 136/80 104/68 110/66  Pulse: 66 71 63 67  Temp:  97.6 F (36.4 C) 97.6 F (36.4 C)   TempSrc:  Oral Oral   Resp: 15 18 18    Height:  5\' 8"  (1.727 m)    Weight:  85.9 kg (189 lb 6 oz)    SpO2: 97% 99% 100%     Intake/Output from previous day: No intake or output data in the 24 hours ending 08/26/11 0944  Physical Exam: Physical exam: Well-developed well-nourished in no acute distress.  Skin is warm and dry.  HEENT is normal.  Neck is supple. Chest is clear to auscultation with normal expansion.  Cardiovascular exam is regular rate and rhythm.  Abdominal exam nontender or distended. No masses palpated. Extremities show no edema. neuro grossly intact    Lab Results: Basic Metabolic Panel:  Basename 08/26/11 0301 08/25/11 1035  NA 133* 134*  K 4.6 4.0  CL 100 98  CO2 26 26  GLUCOSE 86 88  BUN 13 12  CREATININE 1.01 0.84  CALCIUM 8.7 9.2  MG -- --  PHOS -- --   CBC:  Basename 08/26/11 0301 08/25/11 1035  WBC 4.1 4.8  NEUTROABS -- --  HGB 13.1 15.0  HCT 38.2* 42.2  MCV 98.2 97.0  PLT 136* 163   Cardiac Enzymes:  Basename 08/26/11 0301 08/25/11 2046 08/25/11 1502  CKTOTAL 79 86 94  CKMB 2.3 2.3 2.2  CKMBINDEX -- -- --  TROPONINI <0.30 <0.30 <0.30     Assessment/Plan:  1) Chest pain-symptoms atypical; enzymes negative; plan  DC today and proceed with outpatient myoview; fu Dr Shirlee Latch in 2 weeks. 2) CAD-continue ASA, plavix, statin, beta blocker. 3) Hypertension-Continue present BP meds 4) Hyperlipidemia - continue statin >30 min PA and physician time D2  Olga Millers 08/26/2011, 9:44 AM

## 2011-09-01 ENCOUNTER — Ambulatory Visit (HOSPITAL_COMMUNITY): Payer: Managed Care, Other (non HMO) | Attending: Cardiology | Admitting: Radiology

## 2011-09-01 VITALS — BP 139/84 | Ht 68.0 in | Wt 185.0 lb

## 2011-09-01 DIAGNOSIS — Z87891 Personal history of nicotine dependence: Secondary | ICD-10-CM | POA: Insufficient documentation

## 2011-09-01 DIAGNOSIS — R0602 Shortness of breath: Secondary | ICD-10-CM

## 2011-09-01 DIAGNOSIS — I10 Essential (primary) hypertension: Secondary | ICD-10-CM | POA: Insufficient documentation

## 2011-09-01 DIAGNOSIS — R079 Chest pain, unspecified: Secondary | ICD-10-CM | POA: Insufficient documentation

## 2011-09-01 DIAGNOSIS — I2510 Atherosclerotic heart disease of native coronary artery without angina pectoris: Secondary | ICD-10-CM

## 2011-09-01 DIAGNOSIS — R0609 Other forms of dyspnea: Secondary | ICD-10-CM | POA: Insufficient documentation

## 2011-09-01 DIAGNOSIS — I252 Old myocardial infarction: Secondary | ICD-10-CM | POA: Insufficient documentation

## 2011-09-01 DIAGNOSIS — R0989 Other specified symptoms and signs involving the circulatory and respiratory systems: Secondary | ICD-10-CM | POA: Insufficient documentation

## 2011-09-01 DIAGNOSIS — R5381 Other malaise: Secondary | ICD-10-CM | POA: Insufficient documentation

## 2011-09-01 DIAGNOSIS — E785 Hyperlipidemia, unspecified: Secondary | ICD-10-CM | POA: Insufficient documentation

## 2011-09-01 MED ORDER — TECHNETIUM TC 99M TETROFOSMIN IV KIT
10.0000 | PACK | Freq: Once | INTRAVENOUS | Status: AC | PRN
  Administered 2011-09-01: 10 via INTRAVENOUS

## 2011-09-01 MED ORDER — REGADENOSON 0.4 MG/5ML IV SOLN
0.4000 mg | Freq: Once | INTRAVENOUS | Status: AC
  Administered 2011-09-01: 0.4 mg via INTRAVENOUS

## 2011-09-01 MED ORDER — TECHNETIUM TC 99M TETROFOSMIN IV KIT
30.0000 | PACK | Freq: Once | INTRAVENOUS | Status: AC | PRN
  Administered 2011-09-01: 30 via INTRAVENOUS

## 2011-09-01 NOTE — Progress Notes (Signed)
[facility] SITE 3 NUCLEAR MED [address] [place] [address] [phone]  Cardiology Nuclear Med Study  Trevor Griffin is a 56 y.o. male     MRN : [id number]     DOB: Mar 08, 1956  Procedure Date: 09/01/2011  Nuclear Med Background Indication for Stress Test:  Evaluation for Ischemia, Stent Patency and Post Hospital:08/25/11 with chest pain and (-) enzymes History:  '08 IO:[id number] STEMI;'08 Stents of LAD x 4;'09 PTCA of LAD due to stent thrombus;6/11 XLK:GMWNU anteroapical scar,no ischemia,EF=37%;7/11 MRI;EF=39%;10/12 Heart Catheterization:nonobstructive CAD,patent LAD stents and Stent of CFX;08/19/16 Echo:EF=50-55% and mild AI Cardiac Risk Factors: History of Smoking, Hypertension and Lipids  Symptoms:  Chest Pain (last date of chest discomfort since in ER 08/25/11), DOE and Fatigue   Nuclear Pre-Procedure Caffeine/Decaff Intake:  None NPO After: 7:00pm   Lungs:  clear O2 Sat: 98% on RA IV 0.9% NS with Angio Cath:  20g  IV Site: R Hand  IV Started by:  Cathlyn Parsons, RN  Chest Size (in):  44 Cup Size: n/a  Height: 5\' 8"  (1.727 m)  Weight:  185 lb (83.915 kg)  BMI:  Body mass index is 28.13 kg/(m^2). Tech Comments:  Bisoprolol held x 30 hrs    Nuclear Med Study 1 or 2 day study: 1 day  Stress Test Type:  Treadmill/Lexiscan  Reading MD: Olga Millers, MD  Order Authorizing Provider:  Fransico Meadow and Azucena Kuba  Resting Radionuclide: Technetium 96m Tetrofosmin  Resting Radionuclide Dose: 10.9 mCi   Stress Radionuclide:  Technetium 4m Tetrofosmin  Stress Radionuclide Dose: 33.0 mCi           Stress Protocol Rest HR: 79 Stress HR: 125  Rest BP: 139/84 Stress BP: 172/91  Exercise Time (min): 2:00 METS: 1.6   Predicted Max HR: 165 bpm % Max HR: 75.76 bpm Rate Pressure Product: 27253   Dose of Adenosine (mg):  n/a Dose of Lexiscan: 0.4 mg  Dose of Atropine (mg): n/a Dose of Dobutamine: n/a mcg/kg/min (at max HR)  Stress Test  Technologist: Cathlyn Parsons, RN  Nuclear Technologist:  Domenic Polite, CNMT     Rest Procedure:  Myocardial perfusion imaging was performed at rest 45 minutes following the intravenous administration of Technetium 43m Tetrofosmin. Rest ECG: NSR - Normal EKG  Stress Procedure:  The patient received IV Lexiscan 0.4 mg over 15-seconds with concurrent low level exercise and then Technetium 80m Tetrofosmin was injected at 30-seconds while the patient continued walking one more minute. There were no significant changes with Lexiscan. Quantitative spect images were obtained after a 45-minute delay. Stress ECG: No significant ST segment change suggestive of ischemia.  QPS Raw Data Images:  Acquisition technically good; mild LVE. Stress Images:  There is decreased uptake in the anterior wall, septum and apex. Rest Images:  There is decreased uptake in the anterior wall, septum and apex. Subtraction (SDS):  There is a fixed defect that is most consistent with a previous infarction. Transient Ischemic Dilatation (Normal <1.22):  1.17 Lung/Heart Ratio (Normal <0.45):  0.33  Quantitative Gated Spect Images QGS EDV:  142 ml QGS ESV:  83 ml  Impression Exercise Capacity:  Lexiscan with no exercise. BP Response:  Normal blood pressure response. Clinical Symptoms:  No chest pain or SOB. ECG Impression:  No significant ST segment change suggestive of ischemia. Comparison with Prior Nuclear Study: No significant change from previous study  Overall Impression:  Abnormal stress nuclear study with a large, severe, fixed anteroseptal and apical defect  consistent with prior infarct; no ischemia.  LV Ejection Fraction: 42%.  LV Wall Motion:  Distal anteroseptal and apical akinesis.   Olga Millers

## 2011-09-02 ENCOUNTER — Encounter: Payer: Self-pay | Admitting: *Deleted

## 2011-09-02 ENCOUNTER — Telehealth: Payer: Self-pay | Admitting: *Deleted

## 2011-09-02 NOTE — Telephone Encounter (Signed)
Message copied by Tarri Fuller on Tue Sep 02, 2011  5:18 PM ------      Message from: [place], [place] T      Created: Tue Sep 02, 2011  1:45 PM       Prior MI noted      No ischemia      EF 42% (EF 50% on recent echo)      Follow up as planned with Dr. [name]      Tereso Newcomer, PA-C  1:45 PM 09/02/2011

## 2011-09-02 NOTE — Telephone Encounter (Signed)
pt notified of stress test results and gave me verbal understanding and read back, will keep [facility] appt

## 2011-09-05 ENCOUNTER — Encounter: Payer: Self-pay | Admitting: Cardiology

## 2011-09-05 ENCOUNTER — Ambulatory Visit (INDEPENDENT_AMBULATORY_CARE_PROVIDER_SITE_OTHER): Payer: Managed Care, Other (non HMO) | Admitting: Cardiology

## 2011-09-05 ENCOUNTER — Encounter: Payer: Self-pay | Admitting: *Deleted

## 2011-09-05 VITALS — BP 131/89 | HR 87 | Resp 18 | Ht 68.0 in | Wt 188.1 lb

## 2011-09-05 DIAGNOSIS — I2589 Other forms of chronic ischemic heart disease: Secondary | ICD-10-CM

## 2011-09-05 DIAGNOSIS — R0600 Dyspnea, unspecified: Secondary | ICD-10-CM

## 2011-09-05 DIAGNOSIS — E7800 Pure hypercholesterolemia, unspecified: Secondary | ICD-10-CM

## 2011-09-05 DIAGNOSIS — I2510 Atherosclerotic heart disease of native coronary artery without angina pectoris: Secondary | ICD-10-CM

## 2011-09-05 DIAGNOSIS — R0609 Other forms of dyspnea: Secondary | ICD-10-CM

## 2011-09-05 MED ORDER — LISINOPRIL 10 MG PO TABS: 10.0000 mg | ORAL_TABLET | Freq: Every day | ORAL | Status: DC

## 2011-09-05 MED ORDER — BISOPROLOL FUMARATE 10 MG PO TABS: 10.0000 mg | ORAL_TABLET | Freq: Every day | ORAL | Status: DC

## 2011-09-05 NOTE — Patient Instructions (Addendum)
Your physician recommends that you schedule a follow-up appointment in: 3 months with Dr. Shirlee Latch  Your physician has recommended you make the following change in your medication:  Increase Lisinopril to 10mg  daily  Increase Bisoprolol to 10 mg daily  Your physician recommends that you return for lab work in: 2 weeks for a bmp and bnp  Your physician recommends that you return for lab work in: June for fasting cholesterol and lfts

## 2011-09-07 NOTE — Assessment & Plan Note (Signed)
Goal LDL < 70.  Last lipids showed LDL 94.  He is going to work on diet and says he will take the atorvastatin daily.  Will repeat lipids in 6/13.

## 2011-09-07 NOTE — Progress Notes (Signed)
[address] [place], [place]  [address] Phone: [phone] Fax:  [phone]  Date:  09/07/2011   Name:  Trevor Griffin       DOB:  1955-06-18 MRN:  [id number]  PCP:  Dr. Yetta Barre  PCP: Dr. Yetta Barre  56 yo with a history of CAD s/p anterior STEMI in 5/08 with placement of 4 Taxus stents in the proximal LAD. 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 EF was borderline for ICD by echo and myoview, so cardiac MRI was done to quantify LV function. EF was found to be 39%.   Admitted 02/2011 with chest pain c/w [place]. MI was r/o. LHC demonstrated 90% stenosis in mCFX, patent stents in the LAD and nonobstructive disease elsewhere. He was treated with PCI to the [facility] with a Promus DES. EF was 35% by echo.   He had atypical chest pain in 4/13 and was hospitalized.  Echo at that time was read as having EF 50%, but myoview showed EF 42% with apical anteroseptal MI, no ischemia.  He has had no further chest pain.  He plans to go back to work soon at [employer].  He denies dyspnea walking on flat ground or up stairs.  He continues to report significant fatigue.  He snores and gasps in his sleep according to his wife.  Sleep study is scheduled for later this month.  Labs (4/13): K 4.6, creatinine 1.01, LDL 94, HDL 40  Past Medical History:  1. Hypertension  2. Gastroesophageal reflux disease  3. COPD: Quit smoking 10/12.  4. CAD: Anterior STEMI 5/08 with 4 overlapping Taxus stents in the proximal to mid LAD. 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 Cath in 2/09 also showed 80% mid to distal CFX stenosis. Myoview (6/11): EF 37%, large anteroapical infarct with no ischemia. [place] 10/12: LHC with prox to mid LAD stent ok with 10-20% ISR, mLAD 40%, CFX 30-40%, mCFX 90%, EF 35%. PCI: Promus Element DES to mCFX (3x12mm).  Lexiscan myoview (4/13) with EF 42%, large fixed  anteroseptal and apical perfusion defect consistent with prior infarct, no ischemia.  5. LV systolic dysfunction: Echo (4/09) with EF 40-50%, Mild global hypokinesis, mild LVH. Echo (6/11): EF 40%, septal/apical akinesis, inferior hypokinesis, mild LVH. Cardiac MRI (7/11): EF 39%, anteroseptal and mid to apical anterior akinesis, scar in the anteroseptal and the mid to apical anterior wall (likely not viable). Echo 02/05/11: EF 35%, grade 1 diast dysfxn, AS Hypo to akinesis, Ant HK, mild LAE.  Echo (4/13) with EF 50%, septal and apical hypokinesis with mild AI.  Myoview in 4/13 calculated EF 42% (this is likely more accurate than the 50% report on the echo).   6. Sleep study 6/08 was negative.  7. Hyperlipidemia  8. Obesity  9. Alcohol Abuse  10. Depression   Current Outpatient Prescriptions  Medication Sig Dispense Refill  . acetaminophen (TYLENOL) 325 MG tablet Take 650 mg by mouth daily as needed. For pain      . aspirin EC 81 MG tablet Take 81 mg by mouth daily.        Marland Kitchen atorvastatin (LIPITOR) 80 MG tablet Take 80 mg by mouth daily.        . bisoprolol (ZEBETA) 10 MG tablet Take 1 tablet (10 mg total) by mouth daily.  90 tablet  3  . buPROPion (  WELLBUTRIN SR) 150 MG 12 hr tablet Take 150 mg by mouth 2 (two) times daily.        . celecoxib (CELEBREX) 200 MG capsule Take 1 capsule (200 mg total) by mouth daily.  15 capsule  0  . clopidogrel (PLAVIX) 75 MG tablet Take 75 mg by mouth daily.      Marland Kitchen lisinopril (PRINIVIL,ZESTRIL) 10 MG tablet Take 1 tablet (10 mg total) by mouth daily.  90 tablet  3  . LORazepam (ATIVAN) 0.5 MG tablet Take 1 tablet (0.5 mg total) by mouth every 8 (eight) hours as needed.  30 tablet  5  . Multiple Vitamins-Minerals (MULTIVITAMIN WITH MINERALS) tablet Take 1 tablet by mouth daily.        . nitroGLYCERIN (NITROSTAT) 0.4 MG SL tablet Place 0.4 mg under the tongue every 5 (five) minutes as needed. For chest pain      . spironolactone (ALDACTONE) 25 MG tablet Take 12.5  mg by mouth daily.        . traMADol (ULTRAM) 50 MG tablet Take 1 tablet (50 mg total) by mouth every 6 (six) hours as needed for pain. Maximum dose= 8 tablets per day  100 tablet  1    Allergies: No Known Allergies  History  Substance Use Topics  . Smoking status: Former Smoker -- 2.0 packs/day    Types: Cigarettes    Quit date: 02/03/2011  . Smokeless tobacco: Not on file  . Alcohol Use: Yes     8-12 BEERS DAILY     ROS:  All systems reviewed and negative except as per HPI.  PHYSICAL EXAM: VS:  BP 131/89  Pulse 87  Resp 18  Ht 5\' 8"  (1.727 m)  Wt 188 lb 1.9 oz (85.331 kg)  BMI 28.60 kg/m2 Well nourished, well developed, in no acute distress Neck: no JVD Cardiac:  normal S1, S2; RRR; no murmur Lungs:  Decreased breath sounds bilaterally, no wheezing, rhonchi or rales Abd: soft, nontender, no hepatomegaly Ext: no edema Psych: normal affect

## 2011-09-07 NOTE — Assessment & Plan Note (Signed)
NYHA class II symptoms.  Not volume overloaded on exam.  Given extensive MI, suspect EF 42% on recent myoview is more accurate than EF 50% by echo.   Continue ACEI and beta blocker: will increase bisoprolol to 10 mg daily and lisinopril to 10 mg daily.  BMET/BNP in 2 wks.

## 2011-09-07 NOTE — Assessment & Plan Note (Signed)
Stable, nonischemic myoview in 4/13.  Continue ASA 81, Plavix, bisoprolol, ACEI, and statin.

## 2011-09-17 ENCOUNTER — Other Ambulatory Visit: Payer: Managed Care, Other (non HMO)

## 2011-09-23 ENCOUNTER — Encounter (HOSPITAL_BASED_OUTPATIENT_CLINIC_OR_DEPARTMENT_OTHER): Payer: Managed Care, Other (non HMO)

## 2011-09-30 ENCOUNTER — Ambulatory Visit (HOSPITAL_BASED_OUTPATIENT_CLINIC_OR_DEPARTMENT_OTHER): Payer: Managed Care, Other (non HMO) | Attending: Physician Assistant | Admitting: General Practice

## 2011-09-30 VITALS — Ht 68.0 in | Wt 180.0 lb

## 2011-09-30 DIAGNOSIS — R5383 Other fatigue: Secondary | ICD-10-CM

## 2011-09-30 DIAGNOSIS — R5381 Other malaise: Secondary | ICD-10-CM

## 2011-09-30 DIAGNOSIS — G4710 Hypersomnia, unspecified: Secondary | ICD-10-CM | POA: Insufficient documentation

## 2011-09-30 DIAGNOSIS — I491 Atrial premature depolarization: Secondary | ICD-10-CM | POA: Insufficient documentation

## 2011-09-30 DIAGNOSIS — G4733 Obstructive sleep apnea (adult) (pediatric): Secondary | ICD-10-CM

## 2011-09-30 DIAGNOSIS — G4730 Sleep apnea, unspecified: Secondary | ICD-10-CM | POA: Insufficient documentation

## 2011-10-03 ENCOUNTER — Other Ambulatory Visit: Payer: Self-pay | Admitting: *Deleted

## 2011-10-03 MED ORDER — BUPROPION HCL ER (SR) 150 MG PO TB12: 150.0000 mg | ORAL_TABLET | Freq: Two times a day (BID) | ORAL | Status: DC

## 2011-10-08 ENCOUNTER — Other Ambulatory Visit: Payer: Self-pay | Admitting: Cardiology

## 2011-10-08 MED ORDER — CLOPIDOGREL BISULFATE 75 MG PO TABS: 75.0000 mg | ORAL_TABLET | Freq: Every day | ORAL | Status: DC

## 2011-10-15 ENCOUNTER — Ambulatory Visit: Payer: Managed Care, Other (non HMO) | Admitting: Cardiology

## 2011-10-15 ENCOUNTER — Other Ambulatory Visit: Payer: Managed Care, Other (non HMO)

## 2011-10-16 DIAGNOSIS — G4710 Hypersomnia, unspecified: Secondary | ICD-10-CM

## 2011-10-16 DIAGNOSIS — I491 Atrial premature depolarization: Secondary | ICD-10-CM

## 2011-10-16 DIAGNOSIS — G4730 Sleep apnea, unspecified: Secondary | ICD-10-CM

## 2011-10-16 NOTE — Procedures (Signed)
NAME:  Trevor Griffin, Trevor Griffin NO.:  [id number]  MEDICAL RECORD NO.:  [id number]          PATIENT TYPE:  OUT  LOCATION:  SLEEP CENTER                 FACILITY:  [facility]  PHYSICIAN:  Barbaraann Share, MD,FCCPDATE OF BIRTH:  [birth year] 18, 1955  DATE OF STUDY:  09/30/2011                           NOCTURNAL POLYSOMNOGRAM  REFERRING PHYSICIAN:  Tereso Newcomer, PA-C  LOCATION:  Sleep lab.  REFERRING PHYSICIAN:  Tereso Newcomer, PA-C  INDICATION FOR STUDY:  Hypersomnia with sleep apnea.  EPWORTH SCORE:  5.  SLEEP ARCHITECTURE:  The patient had a total sleep time of 414 minutes with adequate quantity of slow wave sleep for age but decreased quantity of REM.  Sleep onset latency was normal at 8 minutes, and REM onset was normal at 98 minutes.  Sleep efficiency was adequate at 92%.  RESPIRATORY DATA:  The patient was found to have 1 apnea and 15 obstructive hypopneas, giving him an apnea-hypopnea index of only 2 events per hour.  The events occurred in all body positions, and there was loud snoring noted throughout.  The patient did not meet split night protocol secondary to the small numbers of events.  OXYGEN DATA:  There was O2 desaturation as low as 87% with the patient's obstructive events.  CARDIAC DATA:  Rare PAC and fusion beats noted, but no clinically significant arrhythmias were seen.  MOVEMENTS/PARASOMNIA:  The patient had small numbers of leg jerks with mild sleep disruption.  There were no abnormal behaviors noted.  IMPRESSION/RECOMMENDATION: 1. Small numbers of obstructive events, which do not meet the AHI     criteria for the obstructive sleep apnea syndrome.  The patient was     noted to have loud snoring, and very transient oxygen desaturation. 2. Rare PAC and fusion beat noted, but no clinically significant     arrhythmias were seen. 3. Small numbers of leg jerks with some sleep disruption.  I suspect     this is not clinically significant, but would     recommend  clinical correlation to see if the patient has a history     of leg jerks or symptoms suggestive of the restless legs syndrome.     Barbaraann Share, MD,FCCP Diplomate, American Board of Sleep Medicine    KMC/MEDQ  D:  10/16/2011 15:19:36  T:  10/16/2011 21:48:03  Job:  [id number]

## 2011-11-12 ENCOUNTER — Telehealth: Payer: Self-pay | Admitting: Internal Medicine

## 2011-11-12 NOTE — Telephone Encounter (Signed)
I called Trevor Griffin. He said the swelling is going down.  He is taking benedryl.  He didn't think he needs to come in.  He will call back if he has any problems from the sting.

## 2011-11-12 NOTE — Telephone Encounter (Signed)
Caller: Gina/Spouse; PCP: Sanda Linger; CB#: [phone]; Had a bee sting inside his mouth yesterday afternoon, 7/9.   No difficulty swallowing or talking.  His left eye is swollen this am after sleeping but is improving already.  He took Benadryl last night.  Triaged per Bee Sting protocal.  Home care given.

## 2011-11-12 NOTE — Telephone Encounter (Signed)
He needs to be seen

## 2011-12-02 ENCOUNTER — Other Ambulatory Visit: Payer: Self-pay | Admitting: *Deleted

## 2011-12-02 MED ORDER — BUPROPION HCL ER (SR) 150 MG PO TB12: 150.0000 mg | ORAL_TABLET | Freq: Two times a day (BID) | ORAL | Status: DC

## 2011-12-24 ENCOUNTER — Ambulatory Visit: Payer: Managed Care, Other (non HMO) | Admitting: Cardiology

## 2012-01-21 ENCOUNTER — Encounter (HOSPITAL_COMMUNITY): Payer: Self-pay | Admitting: *Deleted

## 2012-01-21 ENCOUNTER — Telehealth: Payer: Self-pay | Admitting: Cardiology

## 2012-01-21 ENCOUNTER — Ambulatory Visit (INDEPENDENT_AMBULATORY_CARE_PROVIDER_SITE_OTHER): Payer: Managed Care, Other (non HMO) | Admitting: Family Medicine

## 2012-01-21 ENCOUNTER — Ambulatory Visit: Payer: Managed Care, Other (non HMO)

## 2012-01-21 ENCOUNTER — Observation Stay (HOSPITAL_COMMUNITY)
Admission: EM | Admit: 2012-01-21 | Discharge: 2012-01-22 | Disposition: A | Discharge status: Home / Self Care | Payer: Managed Care, Other (non HMO) | Attending: Cardiology | Admitting: Cardiology

## 2012-01-21 VITALS — BP 112/70 | HR 70 | Temp 98.4°F | Resp 16 | Ht 68.5 in | Wt 178.0 lb

## 2012-01-21 DIAGNOSIS — R5381 Other malaise: Secondary | ICD-10-CM

## 2012-01-21 DIAGNOSIS — F411 Generalized anxiety disorder: Secondary | ICD-10-CM | POA: Insufficient documentation

## 2012-01-21 DIAGNOSIS — M25559 Pain in unspecified hip: Secondary | ICD-10-CM | POA: Insufficient documentation

## 2012-01-21 DIAGNOSIS — F1010 Alcohol abuse, uncomplicated: Secondary | ICD-10-CM

## 2012-01-21 DIAGNOSIS — F419 Anxiety disorder, unspecified: Secondary | ICD-10-CM

## 2012-01-21 DIAGNOSIS — R000 Tachycardia, unspecified: Secondary | ICD-10-CM

## 2012-01-21 DIAGNOSIS — I2589 Other forms of chronic ischemic heart disease: Secondary | ICD-10-CM | POA: Insufficient documentation

## 2012-01-21 DIAGNOSIS — E669 Obesity, unspecified: Secondary | ICD-10-CM | POA: Insufficient documentation

## 2012-01-21 DIAGNOSIS — R0789 Other chest pain: Principal | ICD-10-CM | POA: Insufficient documentation

## 2012-01-21 DIAGNOSIS — J449 Chronic obstructive pulmonary disease, unspecified: Secondary | ICD-10-CM | POA: Insufficient documentation

## 2012-01-21 DIAGNOSIS — I255 Ischemic cardiomyopathy: Secondary | ICD-10-CM | POA: Diagnosis present

## 2012-01-21 DIAGNOSIS — I252 Old myocardial infarction: Secondary | ICD-10-CM | POA: Insufficient documentation

## 2012-01-21 DIAGNOSIS — I2510 Atherosclerotic heart disease of native coronary artery without angina pectoris: Secondary | ICD-10-CM

## 2012-01-21 DIAGNOSIS — E785 Hyperlipidemia, unspecified: Secondary | ICD-10-CM | POA: Diagnosis present

## 2012-01-21 DIAGNOSIS — M25551 Pain in right hip: Secondary | ICD-10-CM

## 2012-01-21 DIAGNOSIS — M545 Low back pain, unspecified: Secondary | ICD-10-CM

## 2012-01-21 DIAGNOSIS — R002 Palpitations: Secondary | ICD-10-CM

## 2012-01-21 DIAGNOSIS — I10 Essential (primary) hypertension: Secondary | ICD-10-CM | POA: Diagnosis present

## 2012-01-21 DIAGNOSIS — R079 Chest pain, unspecified: Secondary | ICD-10-CM

## 2012-01-21 DIAGNOSIS — J4489 Other specified chronic obstructive pulmonary disease: Secondary | ICD-10-CM | POA: Insufficient documentation

## 2012-01-21 LAB — COMPREHENSIVE METABOLIC PANEL
ALT: 14 U/L (ref 0–53)
AST: 16 U/L (ref 0–37)
Alkaline Phosphatase: 87 U/L (ref 39–117)
Sodium: 134 mEq/L — ABNORMAL LOW (ref 135–145)
Total Bilirubin: 0.5 mg/dL (ref 0.3–1.2)
Total Protein: 7 g/dL (ref 6.0–8.3)

## 2012-01-21 LAB — POCT CBC
Lymph, poc: 1.2 (ref 0.6–3.4)
MCHC: 31.9 g/dL (ref 31.8–35.4)
MPV: 7.3 fL (ref 0–99.8)
POC Granulocyte: 2.7 (ref 2–6.9)
POC LYMPH PERCENT: 27.4 %L (ref 10–50)
POC MID %: 10.6 %M (ref 0–12)
RDW, POC: 14.3 %

## 2012-01-21 LAB — CBC
HCT: 42 % (ref 39.0–52.0)
Hemoglobin: 14.5 g/dL (ref 13.0–17.0)
MCH: 34.1 pg — ABNORMAL HIGH (ref 26.0–34.0)
MCHC: 34.5 g/dL (ref 30.0–36.0)
MCV: 98.8 fL (ref 78.0–100.0)
RBC: 4.25 MIL/uL (ref 4.22–5.81)

## 2012-01-21 LAB — GLUCOSE, POCT (MANUAL RESULT ENTRY): POC Glucose: 70 mg/dl (ref 70–99)

## 2012-01-21 LAB — POCT I-STAT, CHEM 8
BUN: 6 mg/dL (ref 6–23)
Calcium, Ion: 1.14 mmol/L (ref 1.12–1.23)
Hemoglobin: 15 g/dL (ref 13.0–17.0)
TCO2: 24 mmol/L (ref 0–100)

## 2012-01-21 LAB — TROPONIN I
Troponin I: <0.3 ng/mL (ref <0.30)
Troponin I: <0.3 ng/mL (ref <0.30)

## 2012-01-21 LAB — CREATININE, SERUM: Creatinine, Ser: 0.78 mg/dL (ref 0.50–1.35)

## 2012-01-21 MED ORDER — GI COCKTAIL ~~LOC~~
30.0000 mL | Freq: Once | ORAL | Status: DC
Start: 2012-01-21 — End: 2012-01-21

## 2012-01-21 MED ORDER — ACETAMINOPHEN 325 MG PO TABS
650.0000 mg | ORAL_TABLET | ORAL | Status: DC | PRN
  Administered 2012-01-22: 650 mg via ORAL
  Filled 2012-01-21: qty 2

## 2012-01-21 MED ORDER — ONDANSETRON HCL 4 MG/2ML IJ SOLN: 4.0000 mg | Freq: Four times a day (QID) | INTRAMUSCULAR | Status: DC | PRN

## 2012-01-21 MED ORDER — ONDANSETRON HCL 4 MG/2ML IJ SOLN
4.0000 mg | Freq: Once | INTRAMUSCULAR | Status: AC
  Administered 2012-01-21: 4 mg via INTRAVENOUS
  Filled 2012-01-21: qty 2

## 2012-01-21 MED ORDER — BUPROPION HCL ER (SR) 150 MG PO TB12
150.0000 mg | ORAL_TABLET | Freq: Two times a day (BID) | ORAL | Status: DC
  Administered 2012-01-22: 150 mg via ORAL
  Filled 2012-01-21 (×3): qty 1

## 2012-01-21 MED ORDER — TRAMADOL HCL 50 MG PO TABS
50.0000 mg | ORAL_TABLET | Freq: Once | ORAL | Status: AC
  Administered 2012-01-21: 50 mg via ORAL
  Filled 2012-01-21: qty 1

## 2012-01-21 MED ORDER — ENOXAPARIN SODIUM 40 MG/0.4ML ~~LOC~~ SOLN
40.0000 mg | SUBCUTANEOUS | Status: DC
  Filled 2012-01-21 (×2): qty 0.4

## 2012-01-21 MED ORDER — SODIUM CHLORIDE 0.9 % IJ SOLN: 3.0000 mL | INTRAMUSCULAR | Status: DC | PRN

## 2012-01-21 MED ORDER — SPIRONOLACTONE 12.5 MG HALF TABLET
12.5000 mg | ORAL_TABLET | Freq: Every day | ORAL | Status: DC
  Administered 2012-01-22: 12.5 mg via ORAL
  Filled 2012-01-21 (×2): qty 1

## 2012-01-21 MED ORDER — MORPHINE SULFATE 4 MG/ML IJ SOLN
4.0000 mg | Freq: Once | INTRAMUSCULAR | Status: AC
  Administered 2012-01-21: 4 mg via INTRAVENOUS
  Filled 2012-01-21: qty 1

## 2012-01-21 MED ORDER — NITROGLYCERIN 0.4 MG SL SUBL: 0.4000 mg | SUBLINGUAL_TABLET | SUBLINGUAL | Status: DC | PRN

## 2012-01-21 MED ORDER — BISOPROLOL FUMARATE 10 MG PO TABS
10.0000 mg | ORAL_TABLET | Freq: Every day | ORAL | Status: DC
  Administered 2012-01-21 – 2012-01-22 (×2): 10 mg via ORAL
  Filled 2012-01-21 (×2): qty 1

## 2012-01-21 MED ORDER — ATORVASTATIN CALCIUM 80 MG PO TABS
80.0000 mg | ORAL_TABLET | Freq: Every day | ORAL | Status: DC
  Administered 2012-01-22: 80 mg via ORAL
  Filled 2012-01-21 (×2): qty 1

## 2012-01-21 MED ORDER — LORAZEPAM 0.5 MG PO TABS
0.5000 mg | ORAL_TABLET | Freq: Three times a day (TID) | ORAL | Status: DC | PRN
  Administered 2012-01-22: 0.5 mg via ORAL
  Filled 2012-01-21: qty 1

## 2012-01-21 MED ORDER — ZOLPIDEM TARTRATE 5 MG PO TABS
5.0000 mg | ORAL_TABLET | Freq: Every evening | ORAL | Status: DC | PRN
  Administered 2012-01-21: 5 mg via ORAL
  Filled 2012-01-21: qty 1

## 2012-01-21 MED ORDER — TRAMADOL HCL 50 MG PO TABS
50.0000 mg | ORAL_TABLET | Freq: Four times a day (QID) | ORAL | Status: DC | PRN
  Administered 2012-01-22 (×2): 50 mg via ORAL
  Filled 2012-01-21 (×2): qty 1

## 2012-01-21 MED ORDER — SODIUM CHLORIDE 0.9 % IJ SOLN
3.0000 mL | Freq: Two times a day (BID) | INTRAMUSCULAR | Status: DC
  Administered 2012-01-21 – 2012-01-22 (×2): 3 mL via INTRAVENOUS

## 2012-01-21 MED ORDER — CLOPIDOGREL BISULFATE 75 MG PO TABS
75.0000 mg | ORAL_TABLET | Freq: Every day | ORAL | Status: DC
  Administered 2012-01-22: 75 mg via ORAL
  Filled 2012-01-21: qty 1

## 2012-01-21 MED ORDER — SODIUM CHLORIDE 0.9 % IV SOLN: 250.0000 mL | INTRAVENOUS | Status: DC | PRN

## 2012-01-21 MED ORDER — LISINOPRIL 10 MG PO TABS
10.0000 mg | ORAL_TABLET | Freq: Every day | ORAL | Status: DC
  Administered 2012-01-21 – 2012-01-22 (×2): 10 mg via ORAL
  Filled 2012-01-21 (×2): qty 1

## 2012-01-21 MED ORDER — ASPIRIN EC 81 MG PO TBEC
81.0000 mg | DELAYED_RELEASE_TABLET | Freq: Every day | ORAL | Status: DC
  Administered 2012-01-22: 81 mg via ORAL
  Filled 2012-01-21: qty 1

## 2012-01-21 NOTE — H&P (Signed)
Cardiology Consult Note   Patient ID: Trevor Griffin MRN: [id number], DOB/AGE: [birth year]   Admit date: 01/21/2012 Date of Consult: 01/21/2012  Primary Physician: Sanda Linger, MD Primary Cardiologist: [name], MD  Reason for consult: evaluation/management of chest pain  HPI: Trevor Griffin is a 56yo Caucasian male with a PMHx significant for CAD (see below), ischemic cardiomyopathy (see below), HTN, HL, GERD, obesity, COPD and history of tobacco abuse who presents to [facility] ED with c/o chest pain.   He has a significant cardiac history dating back to 2008 with multiple PCIs. He was recently admitted in 08/2011 for chest pain and ruled out for MI. He underwent a Lexiscan Myoview revealing no evidence of ischemia, LVEF 42%, distal anteroseptal and apical AK. He followed up with Dr. Shirlee Latch the next month, and denied ischemic, heart failure or arrhythmic symptoms. In fact, he was looking forward to resuming work.   Since Sunday, he reports experiencing intermittent substernal chest tightness occurring at rest, without radiation with associated shortness of breath, lightheadedness, diaphoresis and nausea. He reports occasional chills and palpitations. He did not attempt alleviation with NTG SL. This continued and he was was advised to present to urgent care for further evaluation. An EKG there revealed new anterior ST changes, and he was transported to [facility] ED. He denies PND, orthopnea, LE edema, weight gain, fevers, vomiting, new cough, unilateral leg swelling, redness, tenderness.   In the ED, EKG reveals nonspecific anterior ST changes. Initial trop-I returned WNL. BMET unremarkable. CXR reveals bronchitic changes, but no evidence of CHF.   Problem List: Past Medical History  Diagnosis Date  . History of tobacco abuse     quit Oct '12  . Hyperlipidemia   . Ischemic cardiomyopathy     Echo 08/20/11 with EF 50-55% (previously 35%)  . Hypertension   . Obesity   . Depression    . CAD (coronary artery disease)     Ant STEMI 5/08 with 4 Taxus DES proximal to mid LAD. 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 Cath in 2/09 also showed 80% mid to distal CFX stenosis. Myoview (6/11): EF 37%, large anteroapical infarct with no ischemia. [place] 10/12: LHC with prox to mid LAD stent ok with 10-20% ISR, mLAD 40%, CFX 30-40%, mCFX 90%, EF 35% - DES to mCFX  . GERD (gastroesophageal reflux disease)   . COPD (chronic obstructive pulmonary disease)   . Alcohol abuse   . Fatigue     sleep test neg 2008  . Anterior myocardial infarction 2008    Past Surgical History  Procedure Date  . Cardiac catheterization     ANTERIOR STEMI W/4  OVERLAPPING TAXUS STENTS IN THE PROXIMAL TO MID LAD     Allergies: No Known Allergies  Home Medications: Prior to Admission medications   Medication Sig Start Date End Date Taking? Authorizing Provider  acetaminophen (TYLENOL) 325 MG tablet Take 650 mg by mouth daily as needed. For pain   Yes Historical Provider, MD  aspirin EC 81 MG tablet Take 81 mg by mouth daily.     Yes Historical Provider, MD  atorvastatin (LIPITOR) 80 MG tablet Take 80 mg by mouth daily.     Yes Historical Provider, MD  bisoprolol (ZEBETA) 10 MG tablet Take 10 mg by mouth daily. 09/05/11  Yes Laurey Morale, MD  buPROPion [facility] SR) 150 MG 12 hr tablet Take 150 mg by mouth 2 (two) times  daily. 12/02/11  Yes Laurey Morale, MD  celecoxib (CELEBREX) 200 MG capsule Take 1 capsule (200 mg total) by mouth daily. 03/26/11 03/25/12 Yes Etta Grandchild, MD  clopidogrel (PLAVIX) 75 MG tablet Take 75 mg by mouth daily. 10/08/11  Yes Laurey Morale, MD  lisinopril (PRINIVIL,ZESTRIL) 10 MG tablet Take 10 mg by mouth daily. 09/05/11  Yes Laurey Morale, MD  LORazepam (ATIVAN) 0.5 MG tablet Take 1 tablet (0.5 mg total) by mouth every 8 (eight) hours as needed. 03/26/11  Yes Etta Grandchild, MD  Multiple Vitamins-Minerals  (MULTIVITAMIN WITH MINERALS) tablet Take 1 tablet by mouth daily.     Yes Historical Provider, MD  nitroGLYCERIN (NITROSTAT) 0.4 MG SL tablet Place 0.4 mg under the tongue every 5 (five) minutes as needed. For chest pain   Yes Historical Provider, MD  spironolactone (ALDACTONE) 25 MG tablet Take 12.5 mg by mouth daily.     Yes Historical Provider, MD  traMADol (ULTRAM) 50 MG tablet Take 1 tablet (50 mg total) by mouth every 6 (six) hours as needed for pain. Maximum dose= 8 tablets per day 03/26/11 03/25/12 Yes Etta Grandchild, MD    Inpatient Medications:     .  morphine injection  4 mg Intravenous Once  . ondansetron  4 mg Intravenous Once    (Not in a hospital admission)  Family History  Problem Relation Age of Onset  . Kidney disease Mother     CHRONIC  . Cancer Sister   . Liver disease Sister     LIVER FAILURE     History   Social History  . Marital Status: Married    Spouse Name: N/A    Number of Children: N/A  . Years of Education: N/A   Occupational History  . Not on file.   Social History Main Topics  . Smoking status: Former Smoker -- 2.0 packs/day    Types: Cigarettes    Quit date: 02/03/2011  . Smokeless tobacco: Not on file  . Alcohol Use: Yes     8-12 BEERS DAILY  . Drug Use: No  . Sexually Active: Yes   Other Topics Concern  . Not on file   Social History Narrative   Alcoholic beverage:  YesDrug use:  NoSeatbead Use: YesFirearms in home:  YesExercise:  NoSmoke Alarm in your home: Yes     Review of Systems: General: positive for chills and diaphoresis, negative for fever, night sweats or weight changes.  Cardiovascular: positive chest pain, shortness of breath, palpitations, negative for dyspnea on exertion, edema, orthopnea, paroxysmal nocturnal dyspnea or shortness of breath Dermatological: negative for rash Respiratory: negative for cough or wheezing Urologic: negative for hematuria Abdominal: positive for nausea, negative for vomiting,  diarrhea, bright red blood per rectum, melena, or hematemesis Neurologic: positive for lightheadedness, negative for visual changes or syncope All other systems reviewed and are otherwise negative except as noted above.  Physical Exam: Blood pressure 109/61, pulse 65, temperature 97.6 F (36.4 C), temperature source Oral, resp. rate 18, SpO2 100.00%.    General: Well developed, well nourished, in no acute distress. Head: Normocephalic, atraumatic, sclera non-icteric, no xanthomas, nares are without discharge.  Neck: Negative for carotid bruits. JVD not elevated. Lungs: Clear bilaterally to auscultation without wheezes, rales, or rhonchi. Breathing is unlabored. Heart: RRR with S1 S2. No murmurs, rubs, or gallops appreciated. Abdomen: Soft, non-tender, non-distended with normoactive bowel sounds. No hepatomegaly. No rebound/guarding. No obvious abdominal masses. Msk:  Strength and tone appears normal  for age. Extremities: R foot fissure on heel. No clubbing, cyanosis or edema.  Distal pedal pulses are 2+ and equal bilaterally. Neuro: Alert and oriented X 3. Moves all extremities spontaneously. Psych:  Responds to questions appropriately with a normal affect.  Labs: Recent Labs  Basename 01/21/12 1751 01/21/12 1420   WBC -- 4.4*   HGB 15.0 16.0   HCT 44.0 50.1   MCV -- 104.4*   PLT -- --   Lab 01/21/12 1751  NA 135  K 4.2  CL 100  CO2 --  BUN 6  CREATININE 1.00  CALCIUM --  PROT --  BILITOT --  ALKPHOS --  ALT --  AST --  AMYLASE --  LIPASE --  GLUCOSE 84   Recent Labs  Basename 01/21/12 1735   CKTOTAL --   CKMB --   CKMBINDEX --   TROPONINI <0.30   Radiology/Studies: Dg Chest 2 View  01/21/2012  *RADIOLOGY REPORT*  Clinical Data: Cough, tachycardia.  CHEST - 2 VIEW  Comparison: 08/25/2011  Findings: Cardiomediastinal contours are unchanged with chronic bronchitic changes.  No confluent airspace opacity.  No pleural effusion or pneumothorax.  Multilevel degenerative  change.  A metallic density projects over the left upper quadrant of the abdomen, incompletely imaged and may be external to the patient as no correlate identified on the lateral view.  IMPRESSION: Chronic bronchitic change without definite evidence of acute cardiopulmonary process.   Original Report Authenticated By: Waneta Martins, M.D.    EKG: NSR, 60 bpm, LAE, nonspecific ST changes V2, V3 new from prior tracings  ASSESSMENT:   1. Chest pain 2. CAD 3. Ischemic cardiomyopathy 4. HTN 5. HL 6. GERD  DISCUSSION/PLAN:  Patient's chest pain has typical and atypical features. He was admitted earlier this year with similar symptoms, ruled out and had a normal nuclear stress test. He is currently comfortable, without pain and hemodynamically stable. He appears euvolemic on exam. After discussing with MD, will plan to admit to telemetry and observe overnight. Will trend cardiac biomarkers. Should his pain worsen or if he rules in, will plan for cardiac catheterization. Continue ASA/Plavix/ACEi/BB/statin/aldactone/NTG SL PRN.   Signed, R. Hurman Horn, PA-C 01/21/2012, 7:20 PM    History and all data above reviewed.  Patient examined.  I agree with the findings as above.  Patient with somewhat atypical chest pain but similar to previous angina.  Also with recent hypotension and palpitations.  The EKG is slightly changed from previous with anterior T wave inversion.  ST elevation in the anterior leads is unchanged from previous.  Currently no pain and no enzyme elevation.  The patient exam reveals COR:RRR  ,  Lungs: Clear  ,  Abd: Positive bowel sounds, no rebound no guarding, Ext No edema  .  All available labs, radiology testing, previous records reviewed. Agree with documented assessment and plan.   Somewhat atypical presentation for an acute coronary process.  I would observe overnight.  Watch rhythm and BP (note he has not been taking his antihypertensives for the last few days.)  If no  further pain or objective evidence of ischemia then I would not suggest further in patient testing.  Repeat EKG in am.  Santos Sollenberger  8:29 PM  01/21/2012

## 2012-01-21 NOTE — ED Provider Notes (Addendum)
History     CSN: [id number]  Arrival date & time 01/21/12  1544   First MD Initiated Contact with Patient 01/21/12 1639      Chief Complaint  Patient presents with  . Chest Pain    (Consider location/radiation/quality/duration/timing/severity/associated sxs/prior treatment) HPI Comments: Trevor Griffin presents in transfer after being originally seen today at [facility] with complaints of generalized fatigue,  Intermittent spikes in his blood pressure followed by hypotension (reports as low as systolic of 80) with accompanying lightheadedness.  He also describes constant vague chest pressure, but denies shortness of breath.  He has had nausea and wife states he did vomit x 1 yesterday.  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  He denies cough, fever and abdominal pain.  He has stable peripheral edema.  His past medical history is significant for CAD with multiple stent placements, most recently 2/09.  He is also treated for hyperlipidemia, hypertension and ischemic cardiomyopathy.  The history is provided by the patient and the spouse.    Past Medical History  Diagnosis Date  . History of tobacco abuse     quit Oct '12  . Hyperlipidemia   . Ischemic cardiomyopathy     Echo 08/20/11 with EF 50-55% (previously 35%)  . Hypertension   . Obesity   . Depression   . CAD (coronary artery disease)     Ant STEMI 5/08 with 4 Taxus DES proximal to mid LAD. 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 Cath in 2/09 also showed 80% mid to distal CFX stenosis. Myoview (6/11): EF 37%, large anteroapical infarct with no ischemia. [place] 10/12: LHC with prox to mid LAD stent ok with 10-20% ISR, mLAD 40%, CFX 30-40%, mCFX 90%, EF 35% - DES to mCFX  . GERD (gastroesophageal reflux disease)   . COPD (chronic obstructive pulmonary disease)   . Alcohol abuse   . Fatigue     sleep  test neg 2008  . Anterior myocardial infarction 2008    Past Surgical History  Procedure Date  . Cardiac catheterization     ANTERIOR STEMI W/4  OVERLAPPING TAXUS STENTS IN THE PROXIMAL TO MID LAD    Family History  Problem Relation Age of Onset  . Kidney disease Mother     CHRONIC  . Cancer Sister   . Liver disease Sister     LIVER FAILURE    History  Substance Use Topics  . Smoking status: Former Smoker -- 2.0 packs/day    Types: Cigarettes    Quit date: 02/03/2011  . Smokeless tobacco: Not on file  . Alcohol Use: Yes     8-12 BEERS DAILY      Review of Systems  Constitutional: Positive for appetite change and fatigue. Negative for fever.  HENT: Negative for congestion, sore throat and neck pain.   Eyes: Negative.   Respiratory: Negative for chest tightness and shortness of breath.   Cardiovascular: Positive for chest pain.  Gastrointestinal: Positive for vomiting. Negative for nausea and abdominal pain.  Genitourinary: Negative.   Musculoskeletal: Negative for joint swelling and arthralgias.  Skin: Negative.  Negative for rash and wound.  Neurological: Positive for light-headedness. Negative for dizziness, weakness, numbness and headaches.  Hematological: Negative.   Psychiatric/Behavioral: Negative.     Allergies  Review of patient's allergies indicates no known allergies.  Home Medications   Current  Outpatient Rx  Name Route Sig Dispense Refill  . ACETAMINOPHEN 325 MG PO TABS Oral Take 650 mg by mouth daily as needed. For pain    . ASPIRIN EC 81 MG PO TBEC Oral Take 81 mg by mouth daily.      . ATORVASTATIN CALCIUM 80 MG PO TABS Oral Take 80 mg by mouth daily.      Marland Kitchen BISOPROLOL FUMARATE 10 MG PO TABS Oral Take 10 mg by mouth daily.    . BUPROPION HCL ER (SR) 150 MG PO TB12 Oral Take 150 mg by mouth 2 (two) times daily.    . CELECOXIB 200 MG PO CAPS Oral Take 1 capsule (200 mg total) by mouth daily. 15 capsule 0  . CLOPIDOGREL BISULFATE 75 MG PO TABS  Oral Take 75 mg by mouth daily.    Marland Kitchen LISINOPRIL 10 MG PO TABS Oral Take 10 mg by mouth daily.    Marland Kitchen LORAZEPAM 0.5 MG PO TABS Oral Take 1 tablet (0.5 mg total) by mouth every 8 (eight) hours as needed. 30 tablet 5  . MULTI-VITAMIN/MINERALS PO TABS Oral Take 1 tablet by mouth daily.      Marland Kitchen NITROGLYCERIN 0.4 MG SL SUBL Sublingual Place 0.4 mg under the tongue every 5 (five) minutes as needed. For chest pain    . SPIRONOLACTONE 25 MG PO TABS Oral Take 12.5 mg by mouth daily.      . TRAMADOL HCL 50 MG PO TABS Oral Take 1 tablet (50 mg total) by mouth every 6 (six) hours as needed for pain. Maximum dose= 8 tablets per day 100 tablet 1    BP 117/71  Pulse 65  Temp 97.6 F (36.4 C) (Oral)  Resp 24  SpO2 99%  Physical Exam  Nursing note and vitals reviewed. Constitutional: He appears well-developed and well-nourished.       Tachycardia at initial presentation,  Resolved at time of exam.  HENT:  Head: Normocephalic and atraumatic.  Eyes: Conjunctivae normal are normal.  Neck: Normal range of motion.  Cardiovascular: Normal rate, regular rhythm, normal heart sounds and intact distal pulses.   Pulmonary/Chest: Effort normal and breath sounds normal. He has no wheezes.  Abdominal: Soft. Bowel sounds are normal. There is no tenderness.  Musculoskeletal: Normal range of motion. He exhibits edema.  Neurological: He is alert.  Skin: Skin is warm and dry.  Psychiatric: He has a normal mood and affect.    ED Course  Procedures (including critical care time)   Labs Reviewed  TROPONIN I  POCT I-STAT, CHEM 8   Dg Chest 2 View  01/21/2012  *RADIOLOGY REPORT*  Clinical Data: Cough, tachycardia.  CHEST - 2 VIEW  Comparison: 08/25/2011  Findings: Cardiomediastinal contours are unchanged with chronic bronchitic changes.  No confluent airspace opacity.  No pleural effusion or pneumothorax.  Multilevel degenerative change.  A metallic density projects over the left upper quadrant of the abdomen,  incompletely imaged and may be external to the patient as no correlate identified on the lateral view.  IMPRESSION: Chronic bronchitic change without definite evidence of acute cardiopulmonary process.   Original Report Authenticated By: Waneta Martins, M.D.      1. Anxiety   2. Hip pain, bilateral   3. Low back pain   4. Chest pain, unspecified   5. Coronary atherosclerosis of native coronary artery     9:33 PM Pt received ASA 324 mg PO per ems prior to arrival.  Also received 1 ntg which did relieve chest  pressure,  Complaint of headache at this time.  Morphine 4 mg , zofran 4 mg IV ordered.   9:33 PM Pt still pain free at this time.  Troponin less than 0.30 .  Spoke with Dr. Caprice Red with [facility] who will see pt in consult.    MDM  Generalized weakness,  Chest pressure with episodic hypotension with multiple risk factors for possible unstable angina.       Date: 01/21/2012  Rate: 60  Rhythm: normal sinus rhythm  QRS Axis: normal  Intervals: normal  ST/T Wave abnormalities: nonspecific T wave changes  Conduction Disutrbances:none  Narrative Interpretation:   Old EKG Reviewed: changes noted from 09/01/11     Burgess Amor, PA 01/21/12 2136  Burgess Amor, PA 02/24/12 2211

## 2012-01-21 NOTE — ED Notes (Signed)
Pt ambulated to restroom. Pt told another RN he is having chest tightness and presssure 4/10

## 2012-01-21 NOTE — Telephone Encounter (Signed)
Wife aware and is planning to take pt to Urgent care today. He will restart medications tomorrow if he is feeling better. Mylo Red RN

## 2012-01-21 NOTE — Progress Notes (Signed)
[facility] [address] [place] [address] [phone]  Date:  01/21/2012   Name:  Trevor Griffin   DOB:  April 14, 1956   MRN:  [id number]  PCP:  Sanda Linger, MD    Chief Complaint: URI, Tachycardia, Fatigue, Headache and Medication Refill   History of Present Illness:  Trevor Griffin is a 56 y.o. very pleasant male patient who presents with the following:  Here today with illness.  Today is Wednesday- on Sunday his pulse was up to around 120 and his BP was up to 148/77. This seemed to go on all day.  He noted chest discomfort for most of the day Sunday- not sure if he would call it CP- he felt antsy and "like my chest was going to explode."  Then over the last couple of days his BP has been abnormally low- he had noted a systolic BP in the 80s on Monday.  He has held his BP medications since then. Spoke to Dr. Alford Highland office this am- he advised him to seek care with his PCP or an UC.    He has noted cough, sneeze, sinus pressure- he has noted these symptoms for the last couple of days.  He was not sure if he might just be sick No fever, but he has had chills.  No aches.  He is having a hard time sleeping and a poor appetite.   He usually drinks a lot of coffee- however since Sunday he has not had any coffee.    He also has noted a sore on the side of his tongue which has been present for about 6 weeks.    His wife wonders if he is ill due to his hot work environment.   He has been smoking 1.5- 2 PPD since the age of 75.  He"quit smoking" and changed to cigars in the spring of this year.    He has a history of alcohol abuse- he usually drinks 6+ beers and takes a promethazine in the morning to go to sleep (he works 3rd shift).  For the last couple of days he has been drinking just 2 beers- he had 2 beers this morning around 7am.  He is not interested in quitting drinking at this time.    He has already taken his aspirin and plavix this morning.  He has not  used any nitrolycerin.  Currently his chest feels a little bit uncomfortable.    Patient Active Problem List  Diagnosis  . HYPERCHOLESTEROLEMIA  . SMOKER  . CORONARY ATHEROSCLEROSIS NATIVE CORONARY ARTERY  . CARDIOMYOPATHY, ISCHEMIC  . CHEST PAIN  . Alcohol abuse  . Hip pain, bilateral  . Low back pain  . Anxiety  . HTN (hypertension)    Past Medical History  Diagnosis Date  . History of tobacco abuse     quit Oct '12  . Hyperlipidemia   . Ischemic cardiomyopathy     Echo 08/20/11 with EF 50-55% (previously 35%)  . Hypertension   . Obesity   . Depression   . CAD (coronary artery disease)     Ant STEMI 5/08 with 4 Taxus DES proximal to mid LAD. 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 Cath in 2/09 also showed 80% mid to distal CFX stenosis. Myoview (6/11): EF 37%, large anteroapical infarct with no ischemia. [place] 10/12: LHC with prox to mid LAD stent ok with 10-20% ISR, mLAD  40%, CFX 30-40%, mCFX 90%, EF 35% - DES to mCFX  . GERD (gastroesophageal reflux disease)   . COPD (chronic obstructive pulmonary disease)   . Alcohol abuse   . Fatigue     sleep test neg 2008  . Anterior myocardial infarction 2008    Past Surgical History  Procedure Date  . Cardiac catheterization     ANTERIOR STEMI W/4  OVERLAPPING TAXUS STENTS IN THE PROXIMAL TO MID LAD    History  Substance Use Topics  . Smoking status: Former Smoker -- 2.0 packs/day    Types: Cigarettes    Quit date: 02/03/2011  . Smokeless tobacco: Not on file  . Alcohol Use: Yes     8-12 BEERS DAILY    Family History  Problem Relation Age of Onset  . Kidney disease Mother     CHRONIC  . Cancer Sister   . Liver disease Sister     LIVER FAILURE    No Known Allergies  Medication list has been reviewed and updated.  Current Outpatient Prescriptions on File Prior to Visit  Medication Sig Dispense Refill  . acetaminophen (TYLENOL) 325 MG tablet Take 650  mg by mouth daily as needed. For pain      . aspirin EC 81 MG tablet Take 81 mg by mouth daily.        Marland Kitchen atorvastatin (LIPITOR) 80 MG tablet Take 80 mg by mouth daily.        . bisoprolol (ZEBETA) 10 MG tablet Take 1 tablet (10 mg total) by mouth daily.  90 tablet  3  . buPROPion (WELLBUTRIN SR) 150 MG 12 hr tablet Take 1 tablet (150 mg total) by mouth 2 (two) times daily.  180 tablet  1  . celecoxib (CELEBREX) 200 MG capsule Take 1 capsule (200 mg total) by mouth daily.  15 capsule  0  . clopidogrel (PLAVIX) 75 MG tablet Take 1 tablet (75 mg total) by mouth daily.  90 tablet  1  . lisinopril (PRINIVIL,ZESTRIL) 10 MG tablet Take 1 tablet (10 mg total) by mouth daily.  90 tablet  3  . LORazepam (ATIVAN) 0.5 MG tablet Take 1 tablet (0.5 mg total) by mouth every 8 (eight) hours as needed.  30 tablet  5  . Multiple Vitamins-Minerals (MULTIVITAMIN WITH MINERALS) tablet Take 1 tablet by mouth daily.        . nitroGLYCERIN (NITROSTAT) 0.4 MG SL tablet Place 0.4 mg under the tongue every 5 (five) minutes as needed. For chest pain      . spironolactone (ALDACTONE) 25 MG tablet Take 12.5 mg by mouth daily.        . traMADol (ULTRAM) 50 MG tablet Take 1 tablet (50 mg total) by mouth every 6 (six) hours as needed for pain. Maximum dose= 8 tablets per day  100 tablet  1    Review of Systems:  As per HPI- otherwise negative.   Physical Examination: Filed Vitals:   01/21/12 1338  BP: 112/70  Pulse: 70  Temp: 98.4 F (36.9 C)  Resp: 16   Filed Vitals:   01/21/12 1338  Height: 5' 8.5" (1.74 m)  Weight: 178 lb (80.74 kg)   Body mass index is 26.67 kg/(m^2). Ideal Body Weight: Weight in (lb) to have BMI = 25: 166.5   GEN: WDWN, NAD, Non-toxic, A & O x 3, evident tobacco and alcohol abuse HEENT: Atraumatic, Normocephalic. Neck supple. No masses, No LAD.  PEERL, EOMI. TM wnl.  I am not able to  visualize the area of concern on his tongue Ears and Nose: No external deformity. CV: RRR, No M/G/R. No  JVD. No thrill. No extra heart sounds. PULM: CTA B, no wheezes, crackles, rhonchi. No retractions. No resp. distress. No accessory muscle use. ABD: S, NT, ND EXTR: No c/c/e NEURO Normal gait.  PSYCH: Normally interactive. Conversant. Not depressed or anxious appearing.  Calm demeanor.   [facility] reading (PRIMARY) by  Dr. Patsy Lager.  CXR: negative. Lungs are clear  EKG:  Evidence of old MI and atrial conduction delay.  He does have increased ST elevation in v2 compared to old EKGs. This is subtle- however discussed with Dr. Shirlee Latch who agreed that he needed a cardiac rule- out given his history and symptoms  Results for orders placed in visit on 01/21/12  POCT CBC      Component Value Range   WBC 4.4 (*) 4.6 - 10.2 K/uL   Lymph, poc 1.2  0.6 - 3.4   POC LYMPH PERCENT 27.4  10 - 50 %L   MID (cbc) 0.5  0 - 0.9   POC MID % 10.6  0 - 12 %M   POC Granulocyte 2.7  2 - 6.9   Granulocyte percent 62.0  37 - 80 %G   RBC 4.80  4.69 - 6.13 M/uL   Hemoglobin 16.0  14.1 - 18.1 g/dL   HCT, POC 95.2  84.1 - 53.7 %   MCV 104.4 (*) 80 - 97 fL   MCH, POC 33.3 (*) 27 - 31.2 pg   MCHC 31.9  31.8 - 35.4 g/dL   RDW, POC 32.4     Platelet Count, POC 191  142 - 424 K/uL   MPV 7.3  0 - 99.8 fL  GLUCOSE, POCT (MANUAL RESULT ENTRY)      Component Value Range   POC Glucose 70  70 - 99 mg/dl    Assessment and Plan: 1. Malaise  POCT CBC, DG Chest 2 View, POCT glucose (manual entry)  2. Tachycardia  DG Chest 2 View, TSH, EKG 12-Lead  3. CAD (coronary artery disease)    4. Alcohol abuse  Comprehensive metabolic panel   Soryn is here today with malaise, but he also has some worrisome recent history of chest discomfort and unexplained hypotension, as well as EKG changes.  Symptoms could be due to alcohol withdrawal, but also consider angina or other cardiac cause.  We will transfer him to the ED for further evaluation- appreciate ED care of this gentleman.   Abbe Amsterdam, MD

## 2012-01-21 NOTE — ED Notes (Signed)
PA made aware of pain 

## 2012-01-21 NOTE — ED Notes (Signed)
Pt given a Happy Meal with some sugar free cookies to eat and a diet caff=free coke ok'd by [employer]

## 2012-01-21 NOTE — ED Notes (Signed)
Patient from Urgent Care with EKG changes- Patient denies chest pain, SOB, N/V.  Given 1 NTG en route with 324 mg ASA.

## 2012-01-21 NOTE — Patient Instructions (Addendum)
Remember to follow- up with an oral surgeon regarding the problem with your tongue.  This is very important as you are at high risk for oral cancers.   If you need help with this referral let us know.  We hope that you feel better very soon

## 2012-01-21 NOTE — Telephone Encounter (Signed)
Pt calls today with low blood pressure x 2 days.  Saturday/Sunday  BP 148/80  HR 115  "felt like my heart was going to come out of my chest" Monday/Tuesday  BP 80-84/50's   Feeling tired Wednesday          BP 124/77  HR 65  Pt stopped his Lisinopril and Bisoprolol. Last dose of each was Monday.  Pt also thinks he may have an URI  B/c of having the following symptoms: chills, feeling hot/cold, runny nose, hacking cough. Poor appetite but drinking fluids. Encouraged pt to see his pcp regarding this. Pt said he may need to go to an Urgent care instead for these sx.  Will forward BP information to Dr. Shirlee Latch for review. Pt has not taken lisinopril or bisoprolol today.

## 2012-01-21 NOTE — Telephone Encounter (Signed)
New Problem:    Patient called in because his blood pressure has been low and he has stopped taking his BP medication.  Please call back.

## 2012-01-21 NOTE — Telephone Encounter (Signed)
Sounds like a URI.  Would restart his BP meds if pressure remains stable tomorrow and he is feeling better.

## 2012-01-21 NOTE — ED Notes (Signed)
MD at bedside. 

## 2012-01-22 ENCOUNTER — Encounter: Payer: Self-pay | Admitting: Family Medicine

## 2012-01-22 ENCOUNTER — Encounter (HOSPITAL_COMMUNITY): Payer: Self-pay | Admitting: Physician Assistant

## 2012-01-22 LAB — BASIC METABOLIC PANEL
BUN: 8 mg/dL (ref 6–23)
CO2: 30 mEq/L (ref 19–32)
Chloride: 100 mEq/L (ref 96–112)
Glucose, Bld: 89 mg/dL (ref 70–99)
Potassium: 4.4 mEq/L (ref 3.5–5.1)
Sodium: 135 mEq/L (ref 135–145)

## 2012-01-22 LAB — TSH: TSH: 1.759 u[IU]/mL (ref 0.350–4.500)

## 2012-01-22 LAB — CBC
HCT: 41.6 % (ref 39.0–52.0)
Hemoglobin: 14.4 g/dL (ref 13.0–17.0)
MCHC: 34.6 g/dL (ref 30.0–36.0)
RBC: 4.19 MIL/uL — ABNORMAL LOW (ref 4.22–5.81)

## 2012-01-22 LAB — TROPONIN I: Troponin I: <0.3 ng/mL (ref <0.30)

## 2012-01-22 MED ORDER — NITROGLYCERIN 0.4 MG SL SUBL: 0.4000 mg | SUBLINGUAL_TABLET | SUBLINGUAL | Status: DC | PRN

## 2012-01-22 NOTE — Discharge Summary (Signed)
Discharge Summary   Patient ID: Trevor Griffin MRN: [id number], DOB/AGE: 1956-03-20 56 y.o. Admit date: 01/21/2012 D/C date:     01/22/2012  Primary Cardiologist: Shirlee Latch  Primary Discharge Diagnoses:  1. Chest pain, atypical 2. Tachypalpitations, for OP event monitor 3. CAD - hx: Ant STEMI 5/08 with 4 Taxus DES proximal to mid LAD. 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 Cath in 2/09 also showed 80% mid to distal CFX stenosis. Myoview (6/11): EF 37%, large anteroapical infarct with no ischemia. [place] 10/12: LHC with prox to mid LAD stent ok with 10-20% ISR, mLAD 40%, CFX 30-40%, mCFX 90%, EF 35% - DES to mCFX  4. Ischemic cardiomyopathy - Echo 08/20/11 with EF 50-55% (previously 35%)  5. HTN with hypotension prior to admission (but pt had doubled up his BP meds)  Secondary Discharge Diagnoses:  1. HL 2. GERD 3. Obesity 4. H/o fatigue with sleep test negative 2008 5. H/o alcohol abuse per record 6. Depression  Hospital Course: Trevor Griffin is a 56 y/o M with CAD s/p multiple PCIs, ICM, HTN, HL, GERD, obesity, COPD and history of tobacco abuse who presented 01/20/12 with complaints of chest pain. He was recently admitted in 08/2011 for chest pain and ruled out for MI. He underwent a Lexiscan Myoview revealing no evidence of ischemia, LVEF 42%. Since 01/18/12, he reported experiencing intermittent substernal chest tightness occurring at rest, without radiation with associated shortness of breath, lightheadedness, diaphoresis and nausea. He reported occasional chills and palpitations. He did not attempt alleviation with NTG SL. He went to urgent care and an EKG there revealed new anterior ST changes, and he was transported to [facility] ED. He denied PND, orthopnea, LE edema, weight gain, fevers, vomiting, new cough, unilateral leg swelling, redness, tenderness. In the ED, EKG showed nonspecific anterior ST changes. Initial trop-I  returned WNL. BMET unremarkable. CXR: bronchitic changes, but no evidence of CHF. Given his history he was admitted for rule-out. Despite prolonged pain/pressure, cardiac enzymes remained negative. TSH was WNL. LFTs were normal. He did not have any further tachypalpitations while in the hospital. His chest pain was felt atypical. It was noted he had doubled his BP meds on Sunday due to the palpitations, which may have precipitated some later hypotension. ECG remained stable. Dr. Shirlee Latch has seen and examined the patient and feels he is stable today. He recommended no change to the patient's prior home medications. Follow-up plans include 3 week event monitor given report of tachypalpitations, as well as plans to see him in 1 week. At that time, if the patient is still having chest pressure, Dr. Shirlee Latch notes he will consider cath.   Discharge Vitals: Blood pressure 108/73, pulse 62, temperature 98.2 F (36.8 C), temperature source Oral, resp. rate 17, height 5\' 8"  (1.727 m), weight 177 lb 12.8 oz (80.65 kg), SpO2 91.00%.  Labs: Lab Results  Component Value Date   WBC 4.5 01/22/2012   HGB 14.4 01/22/2012   HCT 41.6 01/22/2012   MCV 99.3 01/22/2012   PLT 131* 01/22/2012     Lab 01/22/12 0440 01/21/12 1418  NA 135 --  K 4.4 --  CL 100 --  CO2 30 --  BUN 8 --  CREATININE 0.93 --  CALCIUM 9.3 --  PROT -- 7.0  BILITOT -- 0.5  ALKPHOS -- 87  ALT -- 14  AST -- 16  GLUCOSE 89 --    Basename 01/22/12 0440 01/21/12  2203 01/21/12 1735  CKTOTAL -- -- --  CKMB -- -- --  TROPONINI <0.30 <0.30 <0.30   Lab Results  Component Value Date   CHOL 158 08/26/2011   HDL 40 08/26/2011   LDLCALC 94 08/26/2011   TRIG 118 08/26/2011   No results found for this basename: DDIMER    Diagnostic Studies/Procedures   Dg Chest 2 View 01/21/2012  *RADIOLOGY REPORT*  Clinical Data: Cough, tachycardia.  CHEST - 2 VIEW  Comparison: 08/25/2011  Findings: Cardiomediastinal contours are unchanged with chronic bronchitic  changes.  No confluent airspace opacity.  No pleural effusion or pneumothorax.  Multilevel degenerative change.  A metallic density projects over the left upper quadrant of the abdomen, incompletely imaged and may be external to the patient as no correlate identified on the lateral view.  IMPRESSION: Chronic bronchitic change without definite evidence of acute cardiopulmonary process.   Original Report Authenticated By: Waneta Martins, M.D.     Discharge Medications   Current Discharge Medication List    CONTINUE these medications which have CHANGED   Details  nitroGLYCERIN (NITROSTAT) 0.4 MG SL tablet Place 1 tablet (0.4 mg total) under the tongue every 5 (five) minutes as needed (up to 3 doses). For chest pain      CONTINUE these medications which have NOT CHANGED   Details  acetaminophen (TYLENOL) 325 MG tablet Take 650 mg by mouth daily as needed. For pain    aspirin EC 81 MG tablet Take 81 mg by mouth daily.      atorvastatin (LIPITOR) 80 MG tablet Take 80 mg by mouth daily.      bisoprolol (ZEBETA) 10 MG tablet Take 10 mg by mouth daily.    buPROPion (WELLBUTRIN SR) 150 MG 12 hr tablet Take 150 mg by mouth 2 (two) times daily.    celecoxib (CELEBREX) 200 MG capsule Take 1 capsule (200 mg total) by mouth daily.    Associated Diagnoses: Hip pain, bilateral; Low back pain    clopidogrel (PLAVIX) 75 MG tablet Take 75 mg by mouth daily.    lisinopril (PRINIVIL,ZESTRIL) 10 MG tablet Take 10 mg by mouth daily.    LORazepam (ATIVAN) 0.5 MG tablet Take 1 tablet (0.5 mg total) by mouth every 8 (eight) hours as needed.    Associated Diagnoses: Anxiety    Multiple Vitamins-Minerals (MULTIVITAMIN WITH MINERALS) tablet Take 1 tablet by mouth daily.      spironolactone (ALDACTONE) 25 MG tablet Take 12.5 mg by mouth daily.      traMADol (ULTRAM) 50 MG tablet Take 1 tablet (50 mg total) by mouth every 6 (six) hours as needed for pain. Maximum dose= 8 tablets per day    Associated  Diagnoses: Hip pain, bilateral; Low back pain        Disposition   The patient will be discharged in stable condition to home. Discharge Orders    Future Appointments: Provider: Department: Dept Phone: Center:   01/29/2012 4:15 PM Laurey Morale, MD [facility] [phone] [facility]     Future Orders Please Complete By Expires   Diet - low sodium heart healthy      Increase activity slowly        Follow-up Information    Follow up with [name], MD. (01/29/12 at 4:15pm. His office will also call you to set up a time to come pick up your heart monitor (to wear for 3 weeks given your heart palpitations).)    Contact information:   [address] [place] [address] [phone]           Duration of Discharge Encounter: Greater than 30 minutes including physician and PA time.  Signed, Ronie Spies PA-C 01/22/2012, 10:49 AM

## 2012-01-22 NOTE — Progress Notes (Signed)
DC orders received.  Patient stable with no S/S of distress. Medication and discharge information reviewed with patient.  Patient DC home. Phillips, Chayson Charters Marie  

## 2012-01-22 NOTE — Progress Notes (Signed)
Utilization Review Completed.  

## 2012-01-22 NOTE — Progress Notes (Signed)
Pt stated he was told he would get a work note to be out until further evaluated. This was provided to him on a prescription to excuse him from work until cleared by his cardiologist. Ronie Spies PA-C

## 2012-01-22 NOTE — ED Provider Notes (Signed)
Medical screening examination/treatment/procedure(s) were conducted as a shared visit with non-physician practitioner(s) and myself.  I personally evaluated the patient during the encounter   Jaskaran Dauzat, MD 01/22/12 1616 

## 2012-01-22 NOTE — Progress Notes (Signed)
Patient ID: Trevor Griffin, male   DOB: Feb 21, 1956, 56 y.o.   MRN: [id number]    SUBJECTIVE:No further chest pressure.  Had prolonged pressure all day yesterday with no elevation in cardiac enzymes.  No further tachypalpitations (had Sunday).  BP stable.      Marland Kitchen aspirin EC  81 mg Oral Daily  . atorvastatin  80 mg Oral Daily  . bisoprolol  10 mg Oral Daily  . buPROPion  150 mg Oral BID  . clopidogrel  75 mg Oral Q breakfast  . enoxaparin (LOVENOX) injection  40 mg Subcutaneous Q24H  . lisinopril  10 mg Oral Daily  .  morphine injection  4 mg Intravenous Once  . ondansetron  4 mg Intravenous Once  . sodium chloride  3 mL Intravenous Q12H  . spironolactone  12.5 mg Oral Daily  . traMADol  50 mg Oral Once  . DISCONTD: gi cocktail  30 mL Oral Once      Filed Vitals:   01/21/12 1920 01/21/12 2056 01/21/12 2143 01/22/12 0634  BP: 113/78 117/71 135/76 111/75  Pulse: 65 65 62 60  Temp:   97.3 F (36.3 C) 98.2 F (36.8 C)  TempSrc:    Oral  Resp: 19 24 20 17   Height:   5\' 8"  (1.727 m)   Weight:   177 lb 12.8 oz (80.65 kg)   SpO2: 98% 99% 99% 91%   No intake or output data in the 24 hours ending 01/22/12 0858  LABS: Basic Metabolic Panel:  Basename 01/22/12 0440 01/21/12 2203 01/21/12 1751 01/21/12 1418  NA 135 -- 135 --  K 4.4 -- 4.2 --  CL 100 -- 100 --  CO2 30 -- -- 27  GLUCOSE 89 -- 84 --  BUN 8 -- 6 --  CREATININE 0.93 0.78 -- --  CALCIUM 9.3 -- -- 9.2  MG -- -- -- --  PHOS -- -- -- --   Liver Function Tests:  Basename 01/21/12 1418  AST 16  ALT 14  ALKPHOS 87  BILITOT 0.5  PROT 7.0  ALBUMIN 4.2   No results found for this basename: LIPASE:2,AMYLASE:2 in the last 72 hours CBC:  Basename 01/22/12 0440 01/21/12 2203  WBC 4.5 4.4  NEUTROABS -- --  HGB 14.4 14.5  HCT 41.6 42.0  MCV 99.3 98.8  PLT 131* 141*   Cardiac Enzymes:  Basename 01/22/12 0440 01/21/12 2203 01/21/12 1735  CKTOTAL -- -- --  CKMB -- -- --  CKMBINDEX -- -- --  TROPONINI <0.30  <0.30 <0.30   BNP: No components found with this basename: POCBNP:3 D-Dimer: No results found for this basename: DDIMER:2 in the last 72 hours Hemoglobin A1C: No results found for this basename: HGBA1C in the last 72 hours Fasting Lipid Panel: No results found for this basename: CHOL,HDL,LDLCALC,TRIG,CHOLHDL,LDLDIRECT in the last 72 hours Thyroid Function Tests:  Basename 01/21/12 1418  TSH 1.759  T4TOTAL --  T3FREE --  THYROIDAB --   Anemia Panel: No results found for this basename: VITAMINB12,FOLATE,FERRITIN,TIBC,IRON,RETICCTPCT in the last 72 hours  RADIOLOGY: Dg Chest 2 View  01/21/2012  *RADIOLOGY REPORT*  Clinical Data: Cough, tachycardia.  CHEST - 2 VIEW  Comparison: 08/25/2011  Findings: Cardiomediastinal contours are unchanged with chronic bronchitic changes.  No confluent airspace opacity.  No pleural effusion or pneumothorax.  Multilevel degenerative change.  A metallic density projects over the left upper quadrant of the abdomen, incompletely imaged and may be external to the patient as no correlate identified on the lateral  view.  IMPRESSION: Chronic bronchitic change without definite evidence of acute cardiopulmonary process.   Original Report Authenticated By: Waneta Martins, M.D.     PHYSICAL EXAM General: NAD Neck: No JVD, no thyromegaly or thyroid nodule.  Lungs: Clear to auscultation bilaterally with normal respiratory effort. CV: Nondisplaced PMI.  Heart regular S1/S2, no S3/S4, no murmur.  No peripheral edema.  No carotid bruit.  Normal pedal pulses.  Abdomen: Soft, nontender, no hepatosplenomegaly, no distention.  Neurologic: Alert and oriented x 3.  Psych: Normal affect. Extremities: No clubbing or cyanosis.   TELEMETRY: Reviewed telemetry pt in NSR, no events  ASSESSMENT AND PLAN: 56 yo with history of CAD and ischemic CMP admitted with atypical chest pain, hypotension on Monday, and tachypalpitations on Sunday. He doubled his BP meds on Sunday due  to the palpitations, which may have precipitated the later hypotension.  He has ruled out for MI.  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  I am going to let him go home today with no changes to his prior home medications (resume prior CHF meds).  I will see him in a week in the office (may overbook).  If he has further chest pressure, will consider cath.  He had stress test earlier this year.  Would also set him up with a 3 week event monitor given tachypalpitations Sunday, ? Atrial fibrillation episodes.    Trevor Griffin 01/22/2012 9:04 AM

## 2012-01-29 ENCOUNTER — Encounter: Payer: Self-pay | Admitting: *Deleted

## 2012-01-29 ENCOUNTER — Encounter: Payer: Self-pay | Admitting: Cardiology

## 2012-01-29 ENCOUNTER — Encounter (INDEPENDENT_AMBULATORY_CARE_PROVIDER_SITE_OTHER): Payer: Managed Care, Other (non HMO)

## 2012-01-29 ENCOUNTER — Ambulatory Visit (INDEPENDENT_AMBULATORY_CARE_PROVIDER_SITE_OTHER): Payer: Managed Care, Other (non HMO) | Admitting: Cardiology

## 2012-01-29 VITALS — BP 140/96 | HR 112 | Ht 68.0 in | Wt 176.0 lb

## 2012-01-29 DIAGNOSIS — I2510 Atherosclerotic heart disease of native coronary artery without angina pectoris: Secondary | ICD-10-CM

## 2012-01-29 DIAGNOSIS — I2589 Other forms of chronic ischemic heart disease: Secondary | ICD-10-CM

## 2012-01-29 DIAGNOSIS — I5022 Chronic systolic (congestive) heart failure: Secondary | ICD-10-CM

## 2012-01-29 DIAGNOSIS — E7800 Pure hypercholesterolemia, unspecified: Secondary | ICD-10-CM

## 2012-01-29 DIAGNOSIS — R002 Palpitations: Secondary | ICD-10-CM

## 2012-01-29 MED ORDER — BISOPROLOL FUMARATE 10 MG PO TABS: ORAL_TABLET | ORAL | Status: DC

## 2012-01-29 NOTE — Patient Instructions (Addendum)
Increase bisoprolol to 15mg  daily. This will be one and one-half 10mg  tablets daily.  Your physician recommends that you return for a FASTING lipid profile /liver profile in the week or so.  Your physician wants you to follow-up in: 6 months with Dr Shirlee Latch. (March 2014). You will receive a reminder letter in the mail two months in advance. If you don't receive a letter, please call our office to schedule the follow-up appointment.

## 2012-01-31 NOTE — Progress Notes (Signed)
Patient ID: [name], male   DOB: 09-08-1955, 56 y.o.   MRN: [id number]          [address] [place], [place]  [address] Phone: [phone] Fax:  [phone]  Date:  01/31/2012   Name:  Trevor Griffin       DOB:  11/18/1955 MRN:  [id number]  PCP:  Dr. Yetta Barre   56 yo with a history of CAD s/p anterior STEMI in 5/08 with placement of 4 Taxus stents in the proximal LAD. 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 EF was borderline for ICD by echo and myoview, so cardiac MRI was done to quantify LV function. EF was found to be 39%.   Admitted 02/2011 with chest pain c/w [place]. MI was r/o. LHC demonstrated 90% stenosis in mCFX, patent stents in the LAD and nonobstructive disease elsewhere. He was treated with PCI to the [facility] with a Promus DES. EF was 35% by echo.  He had atypical chest pain in 4/13 and was hospitalized.  Echo at that time was read as having EF 50%, but myoview showed EF 42% with apical anteroseptal MI, no ischemia.    In 9/13, he was admitted with atypical chest pain associated with tachypalpitations.  He felt his heart racing for most of a day.  He had left-sided chest pain and shortness of breath associated with the palpitations.  He was admitted and ruled out for MI.  ECG was stable.  There were no arrhythmias noted overnight on telemetry.  He has had no more symptoms since his discharge and has been doing well in general.  BP is elevated today, but SBP has been running in the 130s when he checks at home.  No exertional chest pain or dyspnea.  He is able to climb a flight of steps without problems.  He is more active at home now that he and his wife have a foster child.    Labs (4/13): K 4.6, creatinine 1.01, LDL 94, HDL 40 Labs (9/13): K 4.4, creatinine 0.93  Past Medical History:  1. Hypertension  2. Gastroesophageal reflux disease  3. COPD: Quit smoking 10/12.  4. CAD: Anterior STEMI 5/08 with 4  overlapping Taxus stents in the proximal to mid LAD. 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 Cath in 2/09 also showed 80% mid to distal CFX stenosis. Myoview (6/11): EF 37%, large anteroapical infarct with no ischemia. [place] 10/12: LHC with prox to mid LAD stent ok with 10-20% ISR, mLAD 40%, CFX 30-40%, mCFX 90%, EF 35%. PCI: Promus Element DES to mCFX (3x73mm).  Lexiscan myoview (4/13) with EF 42%, large fixed anteroseptal and apical perfusion defect consistent with prior infarct, no ischemia.  5. LV systolic dysfunction: Echo (4/09) with EF 40-50%, Mild global hypokinesis, mild LVH. Echo (6/11): EF 40%, septal/apical akinesis, inferior hypokinesis, mild LVH. Cardiac MRI (7/11): EF 39%, anteroseptal and mid to apical anterior akinesis, scar in the anteroseptal and the mid to apical anterior wall (likely not viable). Echo 02/05/11: EF 35%, grade 1 diast dysfxn, AS Hypo to akinesis, Ant HK, mild LAE.  Echo (4/13) with EF 50%, septal and apical hypokinesis with mild AI.  Myoview in 4/13 calculated EF 42% (this is likely more accurate than the 50% report on the echo).   6. Sleep study 6/08 was negative.  Sleep study 5/13  showed no OSA.   7. Hyperlipidemia  8. Obesity  9. Alcohol Abuse  10. Depression   Current Outpatient Prescriptions  Medication Sig Dispense Refill  . acetaminophen (TYLENOL) 325 MG tablet Take 650 mg by mouth daily as needed. For pain      . aspirin EC 81 MG tablet Take 81 mg by mouth daily.        Marland Kitchen atorvastatin (LIPITOR) 80 MG tablet Take 80 mg by mouth daily.        Marland Kitchen buPROPion (WELLBUTRIN SR) 150 MG 12 hr tablet Take 150 mg by mouth 2 (two) times daily.      . celecoxib (CELEBREX) 200 MG capsule Take 1 capsule (200 mg total) by mouth daily.  15 capsule  0  . clopidogrel (PLAVIX) 75 MG tablet Take 75 mg by mouth daily.      Marland Kitchen lisinopril (PRINIVIL,ZESTRIL) 10 MG tablet Take 10 mg by mouth daily.      Marland Kitchen LORazepam (ATIVAN)  0.5 MG tablet Take 1 tablet (0.5 mg total) by mouth every 8 (eight) hours as needed.  30 tablet  5  . Multiple Vitamins-Minerals (MULTIVITAMIN WITH MINERALS) tablet Take 1 tablet by mouth daily.        . nitroGLYCERIN (NITROSTAT) 0.4 MG SL tablet Place 1 tablet (0.4 mg total) under the tongue every 5 (five) minutes as needed (up to 3 doses). For chest pain      . spironolactone (ALDACTONE) 25 MG tablet Take 12.5 mg by mouth daily.        . traMADol (ULTRAM) 50 MG tablet Take 1 tablet (50 mg total) by mouth every 6 (six) hours as needed for pain. Maximum dose= 8 tablets per day  100 tablet  1  . bisoprolol (ZEBETA) 10 MG tablet 1 and 1/2 tablets (total 15mg ) daily  45 tablet  6    Allergies: No Known Allergies  History  Substance Use Topics  . Smoking status: Former Smoker -- 2.0 packs/day    Types: Cigarettes    Quit date: 02/03/2011  . Smokeless tobacco: Not on file  . Alcohol Use: Yes     8-12 BEERS DAILY     ROS:  All systems reviewed and negative except as per HPI.  PHYSICAL EXAM: VS:  BP 140/96  Pulse 112  Ht 5\' 8"  (1.727 m)  Wt 176 lb (79.833 kg)  BMI 26.76 kg/m2 Well nourished, well developed, in no acute distress Neck: no JVD Cardiac:  normal S1, S2; RRR; 1/6 LLSB murmur.  Lungs:  Decreased breath sounds bilaterally, no wheezing, rhonchi or rales Abd: soft, nontender, no hepatomegaly Ext: no edema Psych: normal affect   Assessment/Plan: CORONARY ATHEROSCLEROSIS NATIVE CORONARY ARTERY  Stable, nonischemic myoview in 4/13. Continue ASA 81, Plavix, bisoprolol, ACEI, and statin.  He was admitted with atypical chest pain earlier this month and ruled out for MI.  I think that this was not related to coronary ischemia.   HYPERCHOLESTEROLEMIA  Goal LDL < 70. Last lipids showed LDL 94. He is going to work on diet and says he will take the atorvastatin daily. Needs repeat lipids (will do today).  CARDIOMYOPATHY, ISCHEMIC  NYHA class II symptoms. Not volume overloaded on exam.  Given extensive MI, suspect EF 42% on recent myoview is more accurate than EF 50% by echo. Continue ACEI and beta blocker: will increase bisoprolol to 15 mg daily today.    Connelly [place] [employer]

## 2012-02-05 ENCOUNTER — Other Ambulatory Visit: Payer: Self-pay | Admitting: Internal Medicine

## 2012-02-05 ENCOUNTER — Other Ambulatory Visit: Payer: Self-pay | Admitting: Cardiology

## 2012-02-11 ENCOUNTER — Other Ambulatory Visit: Payer: Managed Care, Other (non HMO)

## 2012-02-11 ENCOUNTER — Ambulatory Visit: Payer: Managed Care, Other (non HMO) | Admitting: Cardiology

## 2012-02-25 ENCOUNTER — Telehealth: Payer: Self-pay | Admitting: *Deleted

## 2012-02-25 NOTE — Telephone Encounter (Signed)
Dr Shirlee Latch reviewed monitor done 01/29/12-02/18/12.--NSR and occasional sinus tachycardia. No more significant arrhythmia. Pt notified.

## 2012-02-25 NOTE — ED Provider Notes (Signed)
Medical screening examination/treatment/procedure(s) were performed by non-physician practitioner and as supervising physician I was immediately available for consultation/collaboration.   Sidharth Leverette, MD 02/25/12 0834 

## 2012-07-07 ENCOUNTER — Encounter: Payer: Self-pay | Admitting: Internal Medicine

## 2012-07-07 ENCOUNTER — Ambulatory Visit (INDEPENDENT_AMBULATORY_CARE_PROVIDER_SITE_OTHER): Payer: Managed Care, Other (non HMO) | Admitting: Internal Medicine

## 2012-07-07 VITALS — BP 130/78 | HR 84 | Temp 97.4°F | Resp 16 | Wt 166.2 lb

## 2012-07-07 DIAGNOSIS — K148 Other diseases of tongue: Secondary | ICD-10-CM | POA: Insufficient documentation

## 2012-07-07 DIAGNOSIS — M25559 Pain in unspecified hip: Secondary | ICD-10-CM

## 2012-07-07 DIAGNOSIS — F419 Anxiety disorder, unspecified: Secondary | ICD-10-CM

## 2012-07-07 DIAGNOSIS — M545 Low back pain: Secondary | ICD-10-CM

## 2012-07-07 DIAGNOSIS — F411 Generalized anxiety disorder: Secondary | ICD-10-CM

## 2012-07-07 DIAGNOSIS — M25552 Pain in left hip: Secondary | ICD-10-CM

## 2012-07-07 MED ORDER — LIDOCAINE VISCOUS 2 % MT SOLN: 20.0000 mL | OROMUCOSAL | Status: DC | PRN

## 2012-07-07 MED ORDER — TRAMADOL HCL 50 MG PO TABS: 50.0000 mg | ORAL_TABLET | Freq: Three times a day (TID) | ORAL | Status: DC | PRN

## 2012-07-07 MED ORDER — TRAZODONE HCL 50 MG PO TABS: 50.0000 mg | ORAL_TABLET | Freq: Every day | ORAL | Status: DC

## 2012-07-07 MED ORDER — LORAZEPAM 0.5 MG PO TABS: 0.5000 mg | ORAL_TABLET | Freq: Three times a day (TID) | ORAL | Status: DC | PRN

## 2012-07-07 NOTE — Progress Notes (Signed)
Subjective:    Patient ID: Trevor Griffin, male    DOB: 02/10/1956, 57 y.o.   MRN: [id number]  Oral Pain  This is a new problem. Episode onset: 2-3 months. The problem occurs constantly. The problem has been gradually worsening. The pain is at a severity of 2/10. The pain is mild. Pertinent negatives include no difficulty swallowing, facial pain, fever, oral bleeding or sinus pressure. Treatments tried: tramadol. The treatment provided no relief.      Review of Systems  Constitutional: Negative for fever, chills, diaphoresis, activity change, appetite change, fatigue and unexpected weight change.  HENT: Negative.  Negative for ear pain, congestion, facial swelling, rhinorrhea, neck pain, neck stiffness, dental problem, postnasal drip and sinus pressure.   Eyes: Negative.   Respiratory: Negative.   Cardiovascular: Negative for chest pain, palpitations and leg swelling.  Gastrointestinal: Negative for nausea, vomiting, abdominal pain, diarrhea, constipation and blood in stool.  Endocrine: Negative.   Genitourinary: Negative.   Musculoskeletal: Positive for back pain (chronic,unchanged) and arthralgias (hips). Negative for myalgias, joint swelling and gait problem.  Skin: Negative.  Negative for color change, rash and wound.  Allergic/Immunologic: Negative.   Neurological: Negative.  Negative for dizziness, speech difficulty, weakness, numbness and headaches.  Hematological: Negative for adenopathy. Does not bruise/bleed easily.  Psychiatric/Behavioral: Positive for sleep disturbance (difficulty falling asleep) and dysphoric mood. Negative for suicidal ideas, hallucinations, behavioral problems, confusion, self-injury, decreased concentration and agitation. The patient is nervous/anxious. The patient is not hyperactive.        Objective:   Physical Exam  Vitals reviewed. Constitutional: He is oriented to person, place, and time. He appears well-developed and well-nourished.  Non-toxic  appearance. He does not have a sickly appearance. He does not appear ill. No distress.  HENT:  Head: Normocephalic and atraumatic. No trismus in the jaw.  Right Ear: External ear normal.  Left Ear: External ear normal.  Mouth/Throat: Oropharynx is clear and moist. Mucous membranes are not pale, not dry and not cyanotic. He has dentures. Oral lesions present. Normal dentition. No dental abscesses, edematous, lacerations or dental caries. No oropharyngeal exudate, posterior oropharyngeal edema, posterior oropharyngeal erythema or tonsillar abscesses.    Eyes: Conjunctivae are normal. Pupils are equal, round, and reactive to light. Right eye exhibits no discharge. Left eye exhibits no discharge. No scleral icterus.  Neck: No JVD present. No tracheal deviation present. No thyromegaly present.  Cardiovascular: Normal rate, regular rhythm, normal heart sounds and intact distal pulses.  Exam reveals no gallop and no friction rub.   No murmur heard. Pulmonary/Chest: Effort normal and breath sounds normal. No stridor. No respiratory distress. He has no wheezes. He has no rales. He exhibits no tenderness.  Abdominal: Soft. Bowel sounds are normal. He exhibits no distension and no mass. There is no tenderness. There is no rebound and no guarding.  Musculoskeletal: Normal range of motion. He exhibits no edema and no tenderness.  Lymphadenopathy:       Head (right side): No submental, no submandibular, no tonsillar, no preauricular, no posterior auricular and no occipital adenopathy present.       Head (left side): No submental, no submandibular, no tonsillar, no preauricular, no posterior auricular and no occipital adenopathy present.    He has no cervical adenopathy.       Right cervical: No superficial cervical, no deep cervical and no posterior cervical adenopathy present.      Left cervical: No superficial cervical, no deep cervical and no posterior cervical adenopathy present.  Right: No  supraclavicular adenopathy present.       Left: No supraclavicular adenopathy present.  Neurological: He is oriented to person, place, and time.  Skin: Skin is warm and dry. No rash noted. He is not diaphoretic. No erythema. No pallor.  Psychiatric: His speech is normal and behavior is normal. Judgment and thought content normal. His mood appears anxious. His affect is not angry, not blunt, not labile and not inappropriate. Cognition and memory are normal. He does not exhibit a depressed mood.     Lab Results  Component Value Date   WBC 4.5 01/22/2012   HGB 14.4 01/22/2012   HCT 41.6 01/22/2012   PLT 131* 01/22/2012   GLUCOSE 89 01/22/2012   CHOL 158 08/26/2011   TRIG 118 08/26/2011   HDL 40 08/26/2011   LDLDIRECT 148.4 10/18/2009   LDLCALC 94 08/26/2011   ALT 14 01/21/2012   AST 16 01/21/2012   NA 135 01/22/2012   K 4.4 01/22/2012   CL 100 01/22/2012   CREATININE 0.93 01/22/2012   BUN 8 01/22/2012   CO2 30 01/22/2012   TSH 1.759 01/21/2012   INR 1.05 08/25/2011   HGBA1C 5.5 02/05/2011       Assessment & Plan:

## 2012-07-07 NOTE — Assessment & Plan Note (Addendum)
Mr. Romig exam and remote history of cigarette smoking and current daily use of cigars makes his painful tongue lesion highly suspicious for early stage oral cancer.  Oral Lesion: Refer pt to oral surgeon for biopsy of lesion. Lidocaine gel PRN for pain relief.

## 2012-07-07 NOTE — Assessment & Plan Note (Signed)
Continue tramadol as needed 

## 2012-07-07 NOTE — Patient Instructions (Signed)

## 2012-07-07 NOTE — Assessment & Plan Note (Signed)
He has been taking his wife's tramadol for pain so I gave him an Rx for that today

## 2012-07-07 NOTE — Assessment & Plan Note (Signed)
Start trazodone, continue ativan as needed

## 2012-07-13 ENCOUNTER — Telehealth: Payer: Self-pay | Admitting: Internal Medicine

## 2012-07-13 NOTE — Telephone Encounter (Signed)
Contacted several surgeon offices no takes pt insurance. Contacted pt to inform him that he need to contact his insurance company to find out who participate  With his insurance.

## 2012-07-16 ENCOUNTER — Telehealth: Payer: Self-pay | Admitting: Cardiology

## 2012-07-16 NOTE — Telephone Encounter (Signed)
FYI... Patient wife called [facility] to inform that pt has appt with ENT Dr. Haroldine Laws

## 2012-07-16 NOTE — Telephone Encounter (Signed)
All Cardiac Records were Faxed to [facility]/Dr.Crossley @ [phone] 07/16/12/KM

## 2012-07-21 ENCOUNTER — Other Ambulatory Visit: Payer: Self-pay | Admitting: Oncology

## 2012-07-21 ENCOUNTER — Other Ambulatory Visit: Payer: Self-pay | Admitting: Internal Medicine

## 2012-07-28 ENCOUNTER — Encounter: Payer: Self-pay | Admitting: Nurse Practitioner

## 2012-07-28 ENCOUNTER — Ambulatory Visit (INDEPENDENT_AMBULATORY_CARE_PROVIDER_SITE_OTHER): Payer: Managed Care, Other (non HMO) | Admitting: Nurse Practitioner

## 2012-07-28 VITALS — BP 110/68 | HR 72 | Ht 68.0 in | Wt 163.0 lb

## 2012-07-28 DIAGNOSIS — Z01818 Encounter for other preprocedural examination: Secondary | ICD-10-CM

## 2012-07-28 DIAGNOSIS — I2589 Other forms of chronic ischemic heart disease: Secondary | ICD-10-CM

## 2012-07-28 DIAGNOSIS — I255 Ischemic cardiomyopathy: Secondary | ICD-10-CM

## 2012-07-28 NOTE — Progress Notes (Signed)
[name] Date of Birth: [birth year] Medical Record #[id number]  History of Present Illness: Mr. Laventure is seen back today for a work in  visit. He is seen for Dr. Shirlee Latch. Last visit here was in September of 2013. He has a history of CAD s/p anterior STEMI in 5/08 with placement of 4 Taxus stents in the proximal LAD. 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 EF was borderline for ICD by echo and myoview, so cardiac MRI was done to quantify LV function. EF was found to be 39%. Admitted 02/2011 with chest pain c/w [place]. MI was r/o. LHC demonstrated 90% stenosis in mCFX, patent stents in the LAD and nonobstructive disease elsewhere. He was treated with PCI to the [facility] with a Promus DES. EF was 35% by echo. He had atypical chest pain in 4/13 and was hospitalized. Echo at that time was read as having EF 50%, but myoview showed EF 42% with apical anteroseptal MI, no ischemia. Admitted 9/13 with atypical chest pain associated with tachypalpitations. Negative monitor. Negative admission for MI.   He comes in today. He is here alone. He is needs pre op clearance. He has been found to have a tongue cancer. Not able to eat very well. Has lost weight. Not having ANY active chest pain or shortness of breath. Remains active. Continues to work. No syncope. Tolerating his medicines. Will find out today what his exact treatment plan will consist of - possible surgery or radiation or combination of the two. He will be going to [facility].   Current Outpatient Prescriptions on File Prior to Visit  Medication Sig Dispense Refill  . acetaminophen (TYLENOL) 325 MG tablet Take 650 mg by mouth daily as needed. For pain      . aspirin EC 81 MG tablet Take 81 mg by mouth daily.        Marland Kitchen atorvastatin (LIPITOR) 80 MG tablet Take 80 mg by mouth daily.        . bisoprolol (ZEBETA) 10 MG tablet 1 and 1/2 tablets (total 15mg ) daily  45 tablet  6  . buPROPion (WELLBUTRIN  SR) 150 MG 12 hr tablet Take 150 mg by mouth 2 (two) times daily.      . clopidogrel (PLAVIX) 75 MG tablet TAKE 1 TABLET BY MOUTH EVERY DAY  90 tablet  1  . lidocaine (XYLOCAINE) 2 % solution Take 20 mLs by mouth as needed for pain.  100 mL  1  . lisinopril (PRINIVIL,ZESTRIL) 10 MG tablet Take 10 mg by mouth daily.      Marland Kitchen LORazepam (ATIVAN) 0.5 MG tablet Take 1 tablet (0.5 mg total) by mouth every 8 (eight) hours as needed.  75 tablet  5  . Multiple Vitamins-Minerals (MULTIVITAMIN WITH MINERALS) tablet Take 1 tablet by mouth daily.        . nitroGLYCERIN (NITROSTAT) 0.4 MG SL tablet Place 1 tablet (0.4 mg total) under the tongue every 5 (five) minutes as needed (up to 3 doses). For chest pain      . spironolactone (ALDACTONE) 25 MG tablet Take 12.5 mg by mouth daily.        . traMADol (ULTRAM) 50 MG tablet Take 1 tablet (50 mg total) by mouth every 8 (eight) hours as needed for pain.  75 tablet  5  . traZODone (DESYREL) 50 MG tablet Take 1 tablet (50 mg total) by mouth at bedtime.  90 tablet  3  No current facility-administered medications on file prior to visit.    No Known Allergies  Past Medical History:  1. Hypertension  2. Gastroesophageal reflux disease  3. COPD: Quit smoking 10/12.  4. CAD: Anterior STEMI 5/08 with 4 overlapping Taxus stents in the proximal to mid LAD. 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 Cath in 2/09 also showed 80% mid to distal CFX stenosis. Myoview (6/11): EF 37%, large anteroapical infarct with no ischemia. [place] 10/12: LHC with prox to mid LAD stent ok with 10-20% ISR, mLAD 40%, CFX 30-40%, mCFX 90%, EF 35%. PCI: Promus Element DES to mCFX (3x48mm). Lexiscan myoview (4/13) with EF 42%, large fixed anteroseptal and apical perfusion defect consistent with prior infarct, no ischemia.  5. LV systolic dysfunction: Echo (4/09) with EF 40-50%, Mild global hypokinesis, mild LVH. Echo (6/11): EF 40%,  septal/apical akinesis, inferior hypokinesis, mild LVH. Cardiac MRI (7/11): EF 39%, anteroseptal and mid to apical anterior akinesis, scar in the anteroseptal and the mid to apical anterior wall (likely not viable). Echo 02/05/11: EF 35%, grade 1 diast dysfxn, AS Hypo to akinesis, Ant HK, mild LAE. Echo (4/13) with EF 50%, septal and apical hypokinesis with mild AI. Myoview in 4/13 calculated EF 42% (this is likely more accurate than the 50% report on the echo).  6. Sleep study 6/08 was negative. Sleep study 5/13 showed no OSA.  7. Hyperlipidemia  8. Obesity  9. Alcohol Abuse  10. Depression    Past Surgical History  Procedure Laterality Date  . Cardiac catheterization      ANTERIOR STEMI W/4  OVERLAPPING TAXUS STENTS IN THE PROXIMAL TO MID LAD    History  Smoking status  . Former Smoker -- 2.00 packs/day  . Types: Cigarettes  . Quit date: 02/03/2011  Smokeless tobacco  . Not on file    History  Alcohol Use  . Yes    Comment: 8-12 BEERS DAILY    Family History  Problem Relation Age of Onset  . Kidney disease Mother     CHRONIC  . Cancer Sister   . Liver disease Sister     LIVER FAILURE    Review of Systems: The review of systems is per the HPI.  Has had a lesion on his tongue noted recently. Trying to get in to see ENT. All other systems were reviewed and are negative.  Physical Exam: BP 110/68  Pulse 72  Ht 5\' 8"  (1.727 m)  Wt 163 lb (73.936 kg)  BMI 24.79 kg/m2 His weight is down 13 pounds.  Patient is alert and in no acute distress. Looks older than his stated age. Skin is warm and dry. Color is normal.  HEENT is unremarkable. Normocephalic/atraumatic. PERRL. Sclera are nonicteric. Neck is supple. No masses. No JVD. Lungs are coarse. Cardiac exam shows a regular rate and rhythm. Abdomen is soft. Extremities are without edema. Gait and ROM are intact. No gross neurologic deficits noted.  LABORATORY DATA: EKG today shows sinus rhythm, old septal infarct and  nonspecific ST/T wave changes. Tracing was reviewed with Dr. Shirlee Latch.   Lab Results  Component Value Date   WBC 4.5 01/22/2012   HGB 14.4 01/22/2012   HCT 41.6 01/22/2012   PLT 131* 01/22/2012   GLUCOSE 89 01/22/2012   CHOL 158 08/26/2011   TRIG 118 08/26/2011   HDL 40 08/26/2011   LDLDIRECT 148.4 10/18/2009   LDLCALC 94 08/26/2011   ALT 14 01/21/2012  AST 16 01/21/2012   NA 135 01/22/2012   K 4.4 01/22/2012   CL 100 01/22/2012   CREATININE 0.93 01/22/2012   BUN 8 01/22/2012   CO2 30 01/22/2012   TSH 1.759 01/21/2012   INR 1.05 08/25/2011   HGBA1C 5.5 02/05/2011    Assessment / Plan:  1. Ischemic CM - with EF felt to be 42% per his last Myoview in April of 2013. No active chest pain. No cardiac symptoms reported. EKG is felt to be ok. Reviewed with Dr. Shirlee Latch here in the office today.   2. HTN - blood pressure looks ok  3. Tongue cancer - needs pre op clearance. I have talked with Dr. Shirlee Latch. We both feel that he is an acceptable candidate for his cancer treatment. He is not having any active cardiac issues at this time. If surgery will be recommended then we will decide about his Plavix/Aspirin therapy.   4. HLD on therapy.   Patient is agreeable to this plan and will call if any problems develop in the interim.   Rosalio Macadamia, RN, ANP-C [facility] [address] [place], [place]  [address]

## 2012-07-28 NOTE — Patient Instructions (Addendum)
Stay on your current medicines  See Dr. Shirlee Latch back as planned  We feel you are an acceptable candidate for your treatment for your cancer.   Call the [facility] office at [phone] if you have any questions, problems or concerns.

## 2012-09-26 ENCOUNTER — Observation Stay (HOSPITAL_COMMUNITY)
Admission: EM | Admit: 2012-09-26 | Discharge: 2012-09-27 | Payer: Managed Care, Other (non HMO) | Attending: Internal Medicine | Admitting: Internal Medicine

## 2012-09-26 ENCOUNTER — Encounter (HOSPITAL_COMMUNITY): Payer: Self-pay | Admitting: *Deleted

## 2012-09-26 DIAGNOSIS — I428 Other cardiomyopathies: Secondary | ICD-10-CM | POA: Insufficient documentation

## 2012-09-26 DIAGNOSIS — F1010 Alcohol abuse, uncomplicated: Secondary | ICD-10-CM

## 2012-09-26 DIAGNOSIS — C029 Malignant neoplasm of tongue, unspecified: Secondary | ICD-10-CM | POA: Insufficient documentation

## 2012-09-26 DIAGNOSIS — I2510 Atherosclerotic heart disease of native coronary artery without angina pectoris: Secondary | ICD-10-CM | POA: Diagnosis present

## 2012-09-26 DIAGNOSIS — I10 Essential (primary) hypertension: Secondary | ICD-10-CM | POA: Diagnosis present

## 2012-09-26 DIAGNOSIS — J449 Chronic obstructive pulmonary disease, unspecified: Secondary | ICD-10-CM | POA: Insufficient documentation

## 2012-09-26 DIAGNOSIS — J9509 Other tracheostomy complication: Principal | ICD-10-CM | POA: Diagnosis present

## 2012-09-26 DIAGNOSIS — R042 Hemoptysis: Secondary | ICD-10-CM

## 2012-09-26 DIAGNOSIS — J9501 Hemorrhage from tracheostomy stoma: Secondary | ICD-10-CM | POA: Diagnosis present

## 2012-09-26 DIAGNOSIS — J4489 Other specified chronic obstructive pulmonary disease: Secondary | ICD-10-CM | POA: Insufficient documentation

## 2012-09-26 DIAGNOSIS — Y849 Medical procedure, unspecified as the cause of abnormal reaction of the patient, or of later complication, without mention of misadventure at the time of the procedure: Secondary | ICD-10-CM | POA: Insufficient documentation

## 2012-09-26 DIAGNOSIS — I255 Ischemic cardiomyopathy: Secondary | ICD-10-CM | POA: Diagnosis present

## 2012-09-26 LAB — CBC WITH DIFFERENTIAL/PLATELET
Eosinophils Relative: 1 % (ref 0–5)
HCT: 37 % — ABNORMAL LOW (ref 39.0–52.0)
Hemoglobin: 12.7 g/dL — ABNORMAL LOW (ref 13.0–17.0)
Lymphocytes Relative: 20 % (ref 12–46)
Lymphs Abs: 1.4 10*3/uL (ref 0.7–4.0)
MCH: 33.2 pg (ref 26.0–34.0)
MCV: 96.6 fL (ref 78.0–100.0)
Monocytes Relative: 15 % — ABNORMAL HIGH (ref 3–12)
Platelets: 420 10*3/uL — ABNORMAL HIGH (ref 150–400)
RBC: 3.83 MIL/uL — ABNORMAL LOW (ref 4.22–5.81)
WBC: 6.9 10*3/uL (ref 4.0–10.5)

## 2012-09-26 LAB — BASIC METABOLIC PANEL
BUN: 15 mg/dL (ref 6–23)
CO2: 30 mEq/L (ref 19–32)
Calcium: 9.9 mg/dL (ref 8.4–10.5)
Glucose, Bld: 99 mg/dL (ref 70–99)
Sodium: 136 mEq/L (ref 135–145)

## 2012-09-26 MED ORDER — ONDANSETRON HCL 4 MG/2ML IJ SOLN
4.0000 mg | Freq: Once | INTRAMUSCULAR | Status: AC
  Administered 2012-09-26: 4 mg via INTRAVENOUS
  Filled 2012-09-26: qty 2

## 2012-09-26 MED ORDER — FENTANYL CITRATE 0.05 MG/ML IJ SOLN
50.0000 ug | INTRAMUSCULAR | Status: DC | PRN
  Administered 2012-09-26 – 2012-09-27 (×3): 50 ug via INTRAVENOUS
  Filled 2012-09-26 (×4): qty 2

## 2012-09-26 NOTE — ED Notes (Addendum)
Trache placed 2 weeks ago. Trache removed 1 week ago at [facility]. Reports stoma as closed PTA/ earlier this week. Here tonight for sob, cough congestion. Stoma opened back up and started bleeding d/t coughing. Scant blood noted in thick purulent yellowish drainage. No known fever PTA. Pt at [facility]. "Instructed by phone, at home in [place], by [place], to come here, family x2 at [facility]. Pt alert, NAD, calm, interactive, congested cough and drainage noted. Takes ASA and plavix. Also mentions black stool

## 2012-09-26 NOTE — ED Provider Notes (Signed)
History     CSN: [id number]  Arrival date & time 09/26/12  2216   First MD Initiated Contact with Patient 09/26/12 2305      Chief Complaint  Patient presents with  . Shortness of Breath  . Cough  . Bleeding/Bruising    (Consider location/radiation/quality/duration/timing/severity/associated sxs/prior treatment) HPI History per PT and his wife bedside, h/o CAD and tongue cancer treated by Dr Manson Passey at [facility] with tongue resection/ reconstruction 09/16/12 and trach that was removed 09/23/12.  He was discharged home 5/22 and doing well with some mild cough, then today with a forceful cough around 8:30pm developed opening of his stoma with BRB and since that time coughing up 3-5 tsp of blood per his wife. He denies any F/C, CP or worsening cough. He lives in [place] and was advised by his physician to come here to be evaluated. He is on plavix and ASA.  Symptoms moderate in severity and persistent since onset, he has some baseline tongue pain s/p upper extremity graft.   Past Medical History  Diagnosis Date  . History of tobacco abuse     quit Oct '12  . Hyperlipidemia   . Ischemic cardiomyopathy     Echo 08/20/11 with EF 50-55% (previously 35%)  . Hypertension   . Obesity   . Depression   . CAD (coronary artery disease)     Ant STEMI 5/08 with 4 Taxus DES proximal to mid LAD. 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 Cath in 2/09 also showed 80% mid to distal CFX stenosis. Myoview (6/11): EF 37%, large anteroapical infarct with no ischemia. [place] 10/12: LHC with prox to mid LAD stent ok with 10-20% ISR, mLAD 40%, CFX 30-40%, mCFX 90%, EF 35% - DES to mCFX  . GERD (gastroesophageal reflux disease)   . COPD (chronic obstructive pulmonary disease)   . Alcohol abuse   . Fatigue     sleep test neg 2008  . Anterior myocardial infarction 2008  . Palpitations     For event monitor 01/2012    Past Surgical History  Procedure  Laterality Date  . Cardiac catheterization      ANTERIOR STEMI W/4  OVERLAPPING TAXUS STENTS IN THE PROXIMAL TO MID LAD    Family History  Problem Relation Age of Onset  . Kidney disease Mother     CHRONIC  . Cancer Sister   . Liver disease Sister     LIVER FAILURE    History  Substance Use Topics  . Smoking status: Former Smoker -- 2.00 packs/day    Types: Cigarettes    Quit date: 02/03/2011  . Smokeless tobacco: Not on file  . Alcohol Use: Yes     Comment: 8-12 BEERS DAILY      Review of Systems  Constitutional: Negative for fever and chills.  HENT: Negative for facial swelling and neck stiffness.   Eyes: Negative for pain.  Respiratory: Positive for cough. Negative for shortness of breath.   Cardiovascular: Negative for chest pain.  Gastrointestinal: Negative for vomiting and abdominal pain.  Genitourinary: Negative for dysuria.  Musculoskeletal: Negative for back pain.  Skin: Negative for rash.  Neurological: Negative for headaches.  Hematological: Bruises/bleeds easily.  All other systems reviewed and are negative.    Allergies  Review of patient's allergies indicates no known allergies.  Home Medications   Current Outpatient Rx  Name  Route  Sig  Dispense  Refill  . acetaminophen (  TYLENOL) 325 MG tablet   Oral   Take 650 mg by mouth daily as needed. For pain         . aspirin EC 81 MG tablet   Oral   Take 81 mg by mouth daily.           Marland Kitchen atorvastatin (LIPITOR) 80 MG tablet   Oral   Take 80 mg by mouth daily.           . bisoprolol (ZEBETA) 10 MG tablet      1 and 1/2 tablets (total 15mg ) daily   45 tablet   6   . buPROPion (WELLBUTRIN SR) 150 MG 12 hr tablet   Oral   Take 150 mg by mouth 2 (two) times daily.         . clopidogrel (PLAVIX) 75 MG tablet      TAKE 1 TABLET BY MOUTH EVERY DAY   90 tablet   1     CUSTOMER NEEDS REFILLS PLEASE. THANK YOU   . HYDROcodone-acetaminophen (NORCO/VICODIN) 5-325 MG per tablet   Oral    Take 1 tablet by mouth every 4 (four) hours as needed.          . lidocaine (XYLOCAINE) 2 % solution   Oral   Take 20 mLs by mouth as needed for pain.   100 mL   1   . lisinopril (PRINIVIL,ZESTRIL) 10 MG tablet   Oral   Take 10 mg by mouth daily.         Marland Kitchen LORazepam (ATIVAN) 0.5 MG tablet   Oral   Take 1 tablet (0.5 mg total) by mouth every 8 (eight) hours as needed.   75 tablet   5   . Multiple Vitamins-Minerals (MULTIVITAMIN WITH MINERALS) tablet   Oral   Take 1 tablet by mouth daily.           . nitroGLYCERIN (NITROSTAT) 0.4 MG SL tablet   Sublingual   Place 1 tablet (0.4 mg total) under the tongue every 5 (five) minutes as needed (up to 3 doses). For chest pain         . spironolactone (ALDACTONE) 25 MG tablet   Oral   Take 12.5 mg by mouth daily.           . traMADol (ULTRAM) 50 MG tablet   Oral   Take 1 tablet (50 mg total) by mouth every 8 (eight) hours as needed for pain.   75 tablet   5   . traZODone (DESYREL) 50 MG tablet   Oral   Take 1 tablet (50 mg total) by mouth at bedtime.   90 tablet   3     There were no vitals taken for this visit.  Physical Exam  Constitutional: He is oriented to person, place, and time. He appears well-developed and well-nourished.  HENT:  Head: Normocephalic and atraumatic.  Dark blood on tongue with surgical changes present  Eyes: EOM are normal. Pupils are equal, round, and reactive to light.  Neck:  Trach site with one external suture ion place and another that has torn out, BRB oozing from trach site, moderate purulent discharge from site as well, no crepitus.   Cardiovascular: Normal rate, regular rhythm and intact distal pulses.   Pulmonary/Chest: Effort normal. No respiratory distress.  Course upper airway breath sounds, lungs clear otherwise  Abdominal: Soft. There is no tenderness.  Musculoskeletal: Normal range of motion. He exhibits no edema.  Neurological: He is alert and oriented  to person, place,  and time.  Skin: Skin is warm and dry.    ED Course  Procedures (including critical care time)  Results for orders placed during the hospital encounter of 09/26/12  CBC WITH DIFFERENTIAL      Result Value Range   WBC 6.9  4.0 - 10.5 K/uL   RBC 3.83 (*) 4.22 - 5.81 MIL/uL   Hemoglobin 12.7 (*) 13.0 - 17.0 g/dL   HCT 16.1 (*) 09.6 - 04.5 %   MCV 96.6  78.0 - 100.0 fL   MCH 33.2  26.0 - 34.0 pg   MCHC 34.3  30.0 - 36.0 g/dL   RDW 40.9  81.1 - 91.4 %   Platelets 420 (*) 150 - 400 K/uL   Neutrophils Relative % 63  43 - 77 %   Neutro Abs 4.4  1.7 - 7.7 K/uL   Lymphocytes Relative 20  12 - 46 %   Lymphs Abs 1.4  0.7 - 4.0 K/uL   Monocytes Relative 15 (*) 3 - 12 %   Monocytes Absolute 1.0  0.1 - 1.0 K/uL   Eosinophils Relative 1  0 - 5 %   Eosinophils Absolute 0.1  0.0 - 0.7 K/uL   Basophils Relative 1  0 - 1 %   Basophils Absolute 0.0  0.0 - 0.1 K/uL  BASIC METABOLIC PANEL      Result Value Range   Sodium 136  135 - 145 mEq/L   Potassium 4.5  3.5 - 5.1 mEq/L   Chloride 97  96 - 112 mEq/L   CO2 30  19 - 32 mEq/L   Glucose, Bld 99  70 - 99 mg/dL   BUN 15  6 - 23 mg/dL   Creatinine, Ser 7.82  0.50 - 1.35 mg/dL   Calcium 9.9  8.4 - 95.6 mg/dL   GFR calc non Af Amer >90  >90 mL/min   GFR calc Af Amer >90  >90 mL/min  CBC      Result Value Range   WBC 6.6  4.0 - 10.5 K/uL   RBC 3.44 (*) 4.22 - 5.81 MIL/uL   Hemoglobin 11.4 (*) 13.0 - 17.0 g/dL   HCT 21.3 (*) 08.6 - 57.8 %   MCV 97.1  78.0 - 100.0 fL   MCH 33.1  26.0 - 34.0 pg   MCHC 34.1  30.0 - 36.0 g/dL   RDW 46.9  62.9 - 52.8 %   Platelets 417 (*) 150 - 400 K/uL  COMPREHENSIVE METABOLIC PANEL      Result Value Range   Sodium 137  135 - 145 mEq/L   Potassium 4.2  3.5 - 5.1 mEq/L   Chloride 99  96 - 112 mEq/L   CO2 27  19 - 32 mEq/L   Glucose, Bld 96  70 - 99 mg/dL   BUN 14  6 - 23 mg/dL   Creatinine, Ser 4.13  0.50 - 1.35 mg/dL   Calcium 9.4  8.4 - 24.4 mg/dL   Total Protein 6.6  6.0 - 8.3 g/dL   Albumin 2.6 (*)  3.5 - 5.2 g/dL   AST 15  0 - 37 U/L   ALT 21  0 - 53 U/L   Alkaline Phosphatase 82  39 - 117 U/L   Total Bilirubin 0.4  0.3 - 1.2 mg/dL   GFR calc non Af Amer >90  >90 mL/min   GFR calc Af Amer >90  >90 mL/min   Dg Neck Soft Tissue  09/27/2012   *  RADIOLOGY REPORT*  Clinical Data: Recent tracheostomy placed tracheostomy removed 1 week ago.  Short of breath  NECK SOFT TISSUES - 1+ VIEW  Comparison: None.  Findings: Single lateral projection demonstrates normal oral pharynx and hypopharynx.  The epiglottis is normal.  No abnormality of the larynx node on the lateral projection.  No prevertebral soft tissue swelling.  Multiple vascular clips noted in the neck.  IMPRESSION: No acute abnormality in soft tissue exam of the neck.   Original Report Authenticated By: Genevive Bi, M.D.   Dg Chest 2 View  09/27/2012   *RADIOLOGY REPORT*  Clinical Data: Short of breath  CHEST - 2 VIEW  Comparison: Chest radiograph 01/21/2012  Findings: Normal mediastinum and heart silhouette.  There is chronic bronchitic markings centrally.  No effusion, infiltrate, or pneumothorax.  IMPRESSION: No acute cardiopulmonary findings.  Chronic bronchitic markings   Original Report Authenticated By: Genevive Bi, M.D.     ENT consulted 11:42 PM d/w Dr Jenne Pane as above. Plan hospitalist admit versus transfer to [facility].  12:23 AM d/w ENT on call at [facility] -MD did follow PT in the hospital and during his course had no bleeding from this trach site.  Plan admit/ Observe overnight 12:35 AM d/w Hospitalist - will see in ER for admit  Labs and imaging obtained  MDM   Bleeding from recent trach site, torn suture, no resp distress  Imaging, labs, multiple consults  MED admit        Sunnie Nielsen, MD 09/28/12 [phone]

## 2012-09-27 ENCOUNTER — Emergency Department (HOSPITAL_COMMUNITY): Payer: Managed Care, Other (non HMO)

## 2012-09-27 DIAGNOSIS — J9509 Other tracheostomy complication: Secondary | ICD-10-CM | POA: Diagnosis present

## 2012-09-27 DIAGNOSIS — C029 Malignant neoplasm of tongue, unspecified: Secondary | ICD-10-CM

## 2012-09-27 DIAGNOSIS — I2510 Atherosclerotic heart disease of native coronary artery without angina pectoris: Secondary | ICD-10-CM

## 2012-09-27 DIAGNOSIS — I10 Essential (primary) hypertension: Secondary | ICD-10-CM

## 2012-09-27 DIAGNOSIS — F1010 Alcohol abuse, uncomplicated: Secondary | ICD-10-CM

## 2012-09-27 DIAGNOSIS — R042 Hemoptysis: Secondary | ICD-10-CM | POA: Diagnosis present

## 2012-09-27 DIAGNOSIS — J9501 Hemorrhage from tracheostomy stoma: Secondary | ICD-10-CM | POA: Diagnosis present

## 2012-09-27 LAB — COMPREHENSIVE METABOLIC PANEL
Alkaline Phosphatase: 82 U/L (ref 39–117)
BUN: 14 mg/dL (ref 6–23)
Chloride: 99 mEq/L (ref 96–112)
GFR calc Af Amer: >90 mL/min (ref >90)
Glucose, Bld: 96 mg/dL (ref 70–99)
Potassium: 4.2 mEq/L (ref 3.5–5.1)
Total Bilirubin: 0.4 mg/dL (ref 0.3–1.2)

## 2012-09-27 LAB — CBC
HCT: 33.4 % — ABNORMAL LOW (ref 39.0–52.0)
Hemoglobin: 11.4 g/dL — ABNORMAL LOW (ref 13.0–17.0)
WBC: 6.6 10*3/uL (ref 4.0–10.5)

## 2012-09-27 MED ORDER — NITROGLYCERIN 0.4 MG SL SUBL: 0.4000 mg | SUBLINGUAL_TABLET | SUBLINGUAL | Status: DC | PRN

## 2012-09-27 MED ORDER — SPIRONOLACTONE 12.5 MG HALF TABLET
12.5000 mg | ORAL_TABLET | Freq: Every day | ORAL | Status: DC
  Filled 2012-09-27: qty 1

## 2012-09-27 MED ORDER — LORAZEPAM 0.5 MG PO TABS: 0.5000 mg | ORAL_TABLET | Freq: Three times a day (TID) | ORAL | Status: DC | PRN

## 2012-09-27 MED ORDER — ASPIRIN EC 81 MG PO TBEC
81.0000 mg | DELAYED_RELEASE_TABLET | Freq: Every day | ORAL | Status: DC
  Filled 2012-09-27: qty 1

## 2012-09-27 MED ORDER — TRAMADOL HCL 50 MG PO TABS: 50.0000 mg | ORAL_TABLET | Freq: Three times a day (TID) | ORAL | Status: DC | PRN

## 2012-09-27 MED ORDER — BUPROPION HCL ER (SR) 150 MG PO TB12
150.0000 mg | ORAL_TABLET | Freq: Two times a day (BID) | ORAL | Status: DC
  Filled 2012-09-27 (×2): qty 1

## 2012-09-27 MED ORDER — DEXTROSE-NACL 5-0.45 % IV SOLN
INTRAVENOUS | Status: DC
  Administered 2012-09-27: 05:00:00 via INTRAVENOUS

## 2012-09-27 MED ORDER — ACETAMINOPHEN 325 MG PO TABS: 650.0000 mg | ORAL_TABLET | Freq: Every day | ORAL | Status: DC | PRN

## 2012-09-27 MED ORDER — DOCUSATE SODIUM 100 MG PO CAPS: 100.0000 mg | ORAL_CAPSULE | Freq: Two times a day (BID) | ORAL | Status: DC

## 2012-09-27 MED ORDER — BISOPROLOL FUMARATE 10 MG PO TABS
15.0000 mg | ORAL_TABLET | Freq: Every day | ORAL | Status: DC
  Filled 2012-09-27: qty 1

## 2012-09-27 MED ORDER — LISINOPRIL 10 MG PO TABS
10.0000 mg | ORAL_TABLET | Freq: Every day | ORAL | Status: DC
  Filled 2012-09-27: qty 1

## 2012-09-27 MED ORDER — OXYCODONE-ACETAMINOPHEN 5-325 MG PO TABS
1.0000 | ORAL_TABLET | ORAL | Status: DC | PRN
  Administered 2012-09-27: 1 via ORAL
  Filled 2012-09-27: qty 1

## 2012-09-27 MED ORDER — LIDOCAINE VISCOUS 2 % MT SOLN
20.0000 mL | OROMUCOSAL | Status: DC | PRN
  Filled 2012-09-27: qty 20

## 2012-09-27 MED ORDER — TRAZODONE HCL 50 MG PO TABS
50.0000 mg | ORAL_TABLET | Freq: Every day | ORAL | Status: DC
  Filled 2012-09-27: qty 1

## 2012-09-27 MED ORDER — SODIUM CHLORIDE 0.9 % IJ SOLN: 3.0000 mL | Freq: Two times a day (BID) | INTRAMUSCULAR | Status: DC

## 2012-09-27 MED ORDER — ATORVASTATIN CALCIUM 80 MG PO TABS
80.0000 mg | ORAL_TABLET | Freq: Every day | ORAL | Status: DC
  Filled 2012-09-27: qty 1

## 2012-09-27 MED ORDER — CLOPIDOGREL BISULFATE 75 MG PO TABS
75.0000 mg | ORAL_TABLET | Freq: Every day | ORAL | Status: DC
Start: 2012-09-27 — End: 2012-09-27
  Filled 2012-09-27 (×2): qty 1

## 2012-09-27 NOTE — Progress Notes (Signed)
Utilization review completed. Kalyn Hofstra, RN, BSN. 

## 2012-09-27 NOTE — H&P (Signed)
[facility]  History and Physical  Pranav Lince [facility] BJY:[id number] DOB: [birth year] DOA: 09/26/2012  Referring physician: Dr. Dierdre Highman PCP: Sanda Linger, MD   Chief Complaint: Janina Mayo site bleeding  HPI:  56 yr. 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  He presents with his wife who gives most of the history, as he is not able to talk much post op. His wife states that he was discharged home on 5/22 from [facility] and was doing well, but then started to have some cough.  At one point, his cough was so forceful that one of his sutures at the trach site opened and some mucus began to come out. Following this, he started to have about 3-5 table spoons of blood pour out.  He was advised by his physician to come to the ED to be evaluated. A CXR was performed which showed no infiltrate.  His H/H was noted to be 12/37. He was not noted to have significant bleeding from his stoma site.  He did not have evidence of leukocytosis.  Xray of neck soft tissue showed no acute abnormality. He states he still has some baseline pain at his tongue surgery site, his neck, and graft sites at his LUE and RLE.   Dr. Dierdre Highman discussed the case with ENT Dr. Jenne Pane.  [facility] was notified and they recommended overnight observation. He is currently hemodynamically stable without further bleeding noted.    Chart Review:  Reviewed.  Review of Systems:  Complete 12 point review of systems otherwise negative except for that stated in the HPI.  Past Medical History  Diagnosis Date  . History of tobacco abuse     quit Oct '12  . Hyperlipidemia   . Ischemic cardiomyopathy     Echo 08/20/11 with EF 50-55% (previously 35%)  . Hypertension   . Obesity   . Depression   . CAD (coronary artery disease)     Ant  STEMI 5/08 with 4 Taxus DES proximal to mid LAD. 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 Cath in 2/09 also showed 80% mid to distal CFX stenosis. Myoview (6/11): EF 37%, large anteroapical infarct with no ischemia. [place] 10/12: LHC with prox to mid LAD stent ok with 10-20% ISR, mLAD 40%, CFX 30-40%, mCFX 90%, EF 35% - DES to mCFX  . GERD (gastroesophageal reflux disease)   . COPD (chronic obstructive pulmonary disease)   . Alcohol abuse   . Fatigue     sleep test neg 2008  . Anterior myocardial infarction 2008  . Palpitations     For event monitor 01/2012    Past Surgical History  Procedure Laterality Date  . Cardiac catheterization      ANTERIOR STEMI W/4  OVERLAPPING TAXUS STENTS IN THE PROXIMAL TO MID LAD    Social History:  reports that he quit smoking about 19 months ago. His smoking use included Cigarettes. He smoked 2.00 packs per day. He does not have any smokeless tobacco history on file. He reports that  drinks alcohol. He reports that he does not use illicit drugs.  Allergies  Allergen Reactions  . Hydromorphone Other (See Comments)    Headache,nausea    Family History  Problem Relation Age of  Onset  . Kidney disease Mother     CHRONIC  . Cancer Sister   . Liver disease Sister     LIVER FAILURE     Prior to Admission medications   Medication Sig Start Date End Date Taking? Authorizing Provider  acetaminophen (TYLENOL) 325 MG tablet Take 650 mg by mouth daily as needed. For pain    Historical Provider, MD  aspirin EC 81 MG tablet Take 81 mg by mouth daily.      Historical Provider, MD  atorvastatin (LIPITOR) 80 MG tablet Take 80 mg by mouth daily.      Historical Provider, MD  bisoprolol (ZEBETA) 10 MG tablet 1 and 1/2 tablets (total 15mg ) daily 01/29/12   Laurey Morale, MD  buPROPion [facility] SR) 150 MG 12 hr tablet Take 150 mg by mouth 2 (two) times daily. 12/02/11   Laurey Morale, MD  clopidogrel  (PLAVIX) 75 MG tablet TAKE 1 TABLET BY MOUTH EVERY DAY 02/05/12   Laurey Morale, MD  HYDROcodone-acetaminophen (NORCO/VICODIN) 5-325 MG per tablet Take 1 tablet by mouth every 4 (four) hours as needed.  07/19/12   Historical Provider, MD  lidocaine (XYLOCAINE) 2 % solution Take 20 mLs by mouth as needed for pain. 07/07/12   Etta Grandchild, MD  lisinopril (PRINIVIL,ZESTRIL) 10 MG tablet Take 10 mg by mouth daily. 09/05/11   Laurey Morale, MD  LORazepam (ATIVAN) 0.5 MG tablet Take 1 tablet (0.5 mg total) by mouth every 8 (eight) hours as needed. 07/07/12   Etta Grandchild, MD  Multiple Vitamins-Minerals (MULTIVITAMIN WITH MINERALS) tablet Take 1 tablet by mouth daily.      Historical Provider, MD  nitroGLYCERIN (NITROSTAT) 0.4 MG SL tablet Place 1 tablet (0.4 mg total) under the tongue every 5 (five) minutes as needed (up to 3 doses). For chest pain 01/22/12   Dayna N Dunn, PA-C  spironolactone (ALDACTONE) 25 MG tablet Take 12.5 mg by mouth daily.      Historical Provider, MD  traMADol (ULTRAM) 50 MG tablet Take 1 tablet (50 mg total) by mouth every 8 (eight) hours as needed for pain. 07/07/12   Etta Grandchild, MD  traZODone (DESYREL) 50 MG tablet Take 1 tablet (50 mg total) by mouth at bedtime. 07/07/12   Etta Grandchild, MD   Physical Exam: Filed Vitals:   09/27/12 0141 09/27/12 0145 09/27/12 0215 09/27/12 0243  BP: 95/56 99/65 92/55  102/68  Pulse: 72 71 70 82  Temp: 98.1 F (36.7 C)     TempSrc: Oral     Resp: 22   19  SpO2: 96% 98% 97% 99%     General:  AAOX3, NAD.  Eyes: EOMI, PERRLA, no icterus.  ENT: unable to open mouth much due to pain, incision without bleeding noted upper neck.  Neck: trach site with two sutures, with noted yellow mucous, no bleeding noted.  Cardiovascular: S1S2, no m/r/g, RRR  Respiratory: CTA bilat  Abdomen: Soft, NT, +BS, no guarding, no distention.  Skin: LUE incision clean with gauze, Right thigh graft site clean/dry.  Musculoskeletal: 5/5 strength bilat UE  and LE.  Psychiatric: appropriate.  Neurologic: CN II -XI intact, XII unable to be examined due to recent tongue surgery and pain.  Wt Readings from Last 3 Encounters:  07/28/12 163 lb (73.936 kg)  07/07/12 166 lb 4 oz (75.411 kg)  01/29/12 176 lb (79.833 kg)    Labs on Admission:  Basic Metabolic Panel:  Recent Labs Lab 09/26/12 2305  NA 136  K 4.5  CL 97  CO2 30  GLUCOSE 99  BUN 15  CREATININE 0.67  CALCIUM 9.9   CBC:  Recent Labs Lab 09/26/12 2305  WBC 6.9  NEUTROABS 4.4  HGB 12.7*  HCT 37.0*  MCV 96.6  PLT 420*   Radiological Exams on Admission: Dg Neck Soft Tissue  09/27/2012   *RADIOLOGY REPORT*  Clinical Data: Recent tracheostomy placed tracheostomy removed 1 week ago.  Short of breath  NECK SOFT TISSUES - 1+ VIEW  Comparison: None.  Findings: Single lateral projection demonstrates normal oral pharynx and hypopharynx.  The epiglottis is normal.  No abnormality of the larynx node on the lateral projection.  No prevertebral soft tissue swelling.  Multiple vascular clips noted in the neck.  IMPRESSION: No acute abnormality in soft tissue exam of the neck.   Original Report Authenticated By: Genevive Bi, M.D.   Dg Chest 2 View  09/27/2012   *RADIOLOGY REPORT*  Clinical Data: Short of breath  CHEST - 2 VIEW  Comparison: Chest radiograph 01/21/2012  Findings: Normal mediastinum and heart silhouette.  There is chronic bronchitic markings centrally.  No effusion, infiltrate, or pneumothorax.  IMPRESSION: No acute cardiopulmonary findings.  Chronic bronchitic markings   Original Report Authenticated By: Genevive Bi, M.D.    Principal Problem:   Tracheostomy stoma dilation Active Problems:   CORONARY ATHEROSCLEROSIS NATIVE CORONARY ARTERY   CARDIOMYOPATHY, ISCHEMIC   HTN (hypertension)   Hemoptysis   Tongue cancer   Assessment/Plan 1. Trach site bleeding: No noted bleeding at this time. Repeat H/H in AM. ENT has been consulted in the ED, they can be  called in the morning again if noted to re-bleed.  Otherwise, he can be discharged with ENT follow up at [facility]. S/p Tongue CA resection by Dr. Manson Passey at [facility]. I do not think he has HAP at this time or an infection, no need to start abx therapy. 2. CAD/hx STEMI: Will not hold cardiac meds at this time, plavix and ASA included, unless has significant bleeding due to hx. Hemodynamically stable. 3. HTN: Continue current meds. 4. NPO except sips with meds. IVF for now. 5. Proph: SCD's. No heparin for now. 6. LUE/RLE graft site: Wound care consult.  Code Status: FULL Family Communication: wife, patient. Disposition Plan/Anticipated LOS: less than 2 midnights.  Time spent: 25 minutes  Jonah Blue, DO  [facility] Team 10  If 7PM-7AM, please contact night-coverage at [email or web address], password [facility] 09/27/2012, 2:46 AM

## 2012-09-27 NOTE — Clinical Social Work Note (Addendum)
11:06am - CSW called [facility] for transport.  CSW received a consult from Charge RN, Aundra Millet who reports that pt is in need of transportation to [facility] ED.  CSW confirmed with Charge and unit RN that [facility] was expecting the pt.  Public librarian confirmed.  CSW to arrange EMS transportation for pt once d/c orders have been placed.  Please contact CSW once d/c orders are in.  Vickii Penna, LCSWA [phone]  Clinical Social Work

## 2012-09-27 NOTE — Discharge Summary (Signed)
Physician Discharge Summary  Trevor Griffin [facility] ZOX:[id number] DOB: October 09, 1955 DOA: 09/26/2012  PCP: Sanda Linger, MD  Admit date: 09/26/2012 Discharge date: 09/27/2012  Time spent: 50 minutes  Transfer to ENT at [facility]   Discharge Diagnoses:  Principal Problem:   Tracheostomy stoma bleeding Active Problems:   CORONARY ATHEROSCLEROSIS NATIVE CORONARY ARTERY   CARDIOMYOPATHY, ISCHEMIC   HTN (hypertension)   Tongue CA s/p recent resection     Discharge Condition: stable  Diet recommendation: heart healthy  Filed Weights   09/27/12 0423  Weight: 65.4 kg (144 lb 2.9 oz)    History of present illness:   56 yr. Old WM w/ hx Oral CA s/p surgery 5/15 for tongue CA at [facility] with trach placement and removal on 5/22, he was discharged home 5/22 . Also has h/o CAD s/p anterior STEMI 2008, s./p multiple DES last 2012 on ASA/Plavix, HTN, COPD, CAD, hx alcohol abuse, depression, HL, presents with the above stated complaint. He presents with his wife who gives most of the history, as he is not able to talk much post op. His wife states that he was discharged home on 5/22 from [place] and was doing well, but then started to have some cough. At one point, his cough was so forceful that one of his sutures at the trach site opened and some mucus began to come out. Following this, he started to have about 3-5 table spoons of blood pour out. He was advised by his physician to come to the ED to be evaluated.  A CXR was performed which showed no infiltrate.  He states he still has some baseline pain at his tongue surgery site, his neck, and graft sites at his LUE and RLE.  Dr. Dierdre Highman discussed the case with ENT Dr. Jenne Pane who recommended transfer to [facility] and the ED physician d/w  [facility] was notified and they recommended overnight observation.   Hospital Course:  Just admitted earlier this am with bleeding from Trach stoma, continues to have slow ooze and hence I requested transfer to ENT at  [facility], this was what ENT Dr.Bates had recommended too earlier. Slight drop in Hb from 12.7 to 11.4 in 7 hours. Hemodynamically stable, except for SBP in 90-100 range but pt asymptomatic from this standpoint. He is an ASA/Plavix for CAD h/o multiple STENTS, last 2012. Did not get thsi mornings dose of anti-platelets, depending on ENT eval at [facility] by his surgeons they will need to make a decision about his anti-platelet agents. I d/w Dr.Keille ENT who recommended transfer to ER and felt that they could evaluate the pt there soon, also d/w EDP at [facility] who was agreeable with this transfer.    Consultations:  D/w ENT [facility]  D/w ENT at [facility], Dr.BAtes  Discharge Exam: Filed Vitals:   09/27/12 0315 09/27/12 0423 09/27/12 0844 09/27/12 0936  BP: 101/71 98/63  90/51  Pulse: 85 78 82   Temp:  97.5 F (36.4 C)    TempSrc:  Axillary    Resp:  18 16   Height:  5\' 5"  (1.651 m)    Weight:  65.4 kg (144 lb 2.9 oz)    SpO2: 98% 100% 94%     General: AAOx3 HEENT: Trach stoma with bleeding Cardiovascular: S1S2/RRR Respiratory: poor air movement b/l  Discharge Instructions     Medication List    STOP taking these medications       aspirin 325 MG tablet      TAKE these medications  acetaminophen 325 MG tablet  Commonly known as:  TYLENOL  Take 650 mg by mouth every 6 (six) hours as needed for pain.     atorvastatin 80 MG tablet  Commonly known as:  LIPITOR  Take 80 mg by mouth daily.     bacitracin 500 UNIT/GM ointment  Apply 1 application topically 2 (two) times daily.     bisoprolol 10 MG tablet  Commonly known as:  ZEBETA  Take 15 mg by mouth daily. Takes 1.5 tablet     buPROPion 150 MG 12 hr tablet  Commonly known as:  WELLBUTRIN SR  Take 150 mg by mouth 2 (two) times daily.     clopidogrel 75 MG tablet  Commonly known as:  PLAVIX  Take 75 mg by mouth daily.     HYDROcodone-acetaminophen 7.5-325 mg/15 ml solution  Commonly known as:  HYCET  Take 20  mLs by mouth 4 (four) times daily as needed for pain.     lidocaine 2 % solution  Commonly known as:  XYLOCAINE  Take 20 mLs by mouth as needed for pain.     lisinopril 10 MG tablet  Commonly known as:  PRINIVIL,ZESTRIL  Take 10 mg by mouth daily.     LORazepam 0.5 MG tablet  Commonly known as:  ATIVAN  Take 0.5 mg by mouth every 8 (eight) hours as needed for anxiety.     multivitamin with minerals tablet  Take 1 tablet by mouth daily.     nitroGLYCERIN 0.4 MG SL tablet  Commonly known as:  NITROSTAT  Place 1 tablet (0.4 mg total) under the tongue every 5 (five) minutes as needed (up to 3 doses). For chest pain     oxyCODONE-acetaminophen 5-325 MG per tablet  Commonly known as:  PERCOCET/ROXICET  Take 1 tablet by mouth every 4 (four) hours as needed for pain.     polyethylene glycol packet  Commonly known as:  MIRALAX / GLYCOLAX  Take 17 g by mouth daily as needed (constipation).     spironolactone 25 MG tablet  Commonly known as:  ALDACTONE  Take 12.5 mg by mouth daily.     traZODone 50 MG tablet  Commonly known as:  DESYREL  Take 1 tablet (50 mg total) by mouth at bedtime.       Allergies  Allergen Reactions  . Hydromorphone Other (See Comments)    Headache,nausea      The results of significant diagnostics from this hospitalization (including imaging, microbiology, ancillary and laboratory) are listed below for reference.    Significant Diagnostic Studies: Dg Neck Soft Tissue  09/27/2012   *RADIOLOGY REPORT*  Clinical Data: Recent tracheostomy placed tracheostomy removed 1 week ago.  Short of breath  NECK SOFT TISSUES - 1+ VIEW  Comparison: None.  Findings: Single lateral projection demonstrates normal oral pharynx and hypopharynx.  The epiglottis is normal.  No abnormality of the larynx node on the lateral projection.  No prevertebral soft tissue swelling.  Multiple vascular clips noted in the neck.  IMPRESSION: No acute abnormality in soft tissue exam of the  neck.   Original Report Authenticated By: Genevive Bi, M.D.   Dg Chest 2 View  09/27/2012   *RADIOLOGY REPORT*  Clinical Data: Short of breath  CHEST - 2 VIEW  Comparison: Chest radiograph 01/21/2012  Findings: Normal mediastinum and heart silhouette.  There is chronic bronchitic markings centrally.  No effusion, infiltrate, or pneumothorax.  IMPRESSION: No acute cardiopulmonary findings.  Chronic bronchitic markings   Original Report  Authenticated By: Genevive Bi, M.D.    Microbiology: No results found for this or any previous visit (from the past 240 hour(s)).   Labs: Basic Metabolic Panel:  Recent Labs Lab 09/26/12 2305 09/27/12 0500  NA 136 137  K 4.5 4.2  CL 97 99  CO2 30 27  GLUCOSE 99 96  BUN 15 14  CREATININE 0.67 0.62  CALCIUM 9.9 9.4   Liver Function Tests:  Recent Labs Lab 09/27/12 0500  AST 15  ALT 21  ALKPHOS 82  BILITOT 0.4  PROT 6.6  ALBUMIN 2.6*   No results found for this basename: LIPASE, AMYLASE,  in the last 168 hours No results found for this basename: AMMONIA,  in the last 168 hours CBC:  Recent Labs Lab 09/26/12 2305 09/27/12 0500  WBC 6.9 6.6  NEUTROABS 4.4  --   HGB 12.7* 11.4*  HCT 37.0* 33.4*  MCV 96.6 97.1  PLT 420* 417*   Cardiac Enzymes: No results found for this basename: CKTOTAL, CKMB, CKMBINDEX, TROPONINI,  in the last 168 hours BNP: BNP (last 3 results) No results found for this basename: PROBNP,  in the last 8760 hours CBG: No results found for this basename: GLUCAP,  in the last 168 hours     Signed:  Joby Hershkowitz  [facility] 09/27/2012, 10:02 AM

## 2012-09-27 NOTE — Progress Notes (Signed)
09/27/2012 10:25 AM Nursing note Report called to [facility] ED. Lauren RN took report on patient. Questions and concerns addressed. IV line left in place at time of transfer per verbal orders Dr. Jomarie Longs. All morning medications held prior to transfer per Dr. Jomarie Longs due to hypotension. EMS called for transport to [facility] ED per verbal orders Dr. Jomarie Longs. No AVS given to patient per verbal orders Dr. Jomarie Longs. Copy of Discharge Summary given to EMS transport.  Kareen Jefferys, Blanchard Kelch

## 2012-09-27 NOTE — Progress Notes (Addendum)
09/27/2012 11:18 AM Nursing note EMS transport here to pick up patient. Report given at bedside. Questions and concerns addressed. Copy of discharge summary given to transporters. Pt. Transported to [facility] ED per orders.  Nicloe Frontera, Blanchard Kelch

## 2012-09-28 ENCOUNTER — Telehealth: Payer: Self-pay | Admitting: Internal Medicine

## 2012-09-28 ENCOUNTER — Emergency Department (HOSPITAL_COMMUNITY): Payer: Managed Care, Other (non HMO)

## 2012-09-28 ENCOUNTER — Emergency Department (HOSPITAL_COMMUNITY)
Admission: EM | Admit: 2012-09-28 | Discharge: 2012-09-28 | Disposition: A | Discharge status: Other Acute Inpatient Hospital | Payer: Managed Care, Other (non HMO) | Attending: Emergency Medicine | Admitting: Emergency Medicine

## 2012-09-28 ENCOUNTER — Encounter (HOSPITAL_COMMUNITY): Payer: Self-pay | Admitting: Emergency Medicine

## 2012-09-28 DIAGNOSIS — Z87891 Personal history of nicotine dependence: Secondary | ICD-10-CM | POA: Insufficient documentation

## 2012-09-28 DIAGNOSIS — E785 Hyperlipidemia, unspecified: Secondary | ICD-10-CM | POA: Insufficient documentation

## 2012-09-28 DIAGNOSIS — IMO0002 Reserved for concepts with insufficient information to code with codable children: Secondary | ICD-10-CM | POA: Insufficient documentation

## 2012-09-28 DIAGNOSIS — E669 Obesity, unspecified: Secondary | ICD-10-CM | POA: Insufficient documentation

## 2012-09-28 DIAGNOSIS — K1370 Unspecified lesions of oral mucosa: Secondary | ICD-10-CM | POA: Insufficient documentation

## 2012-09-28 DIAGNOSIS — T888XXA Other specified complications of surgical and medical care, not elsewhere classified, initial encounter: Secondary | ICD-10-CM

## 2012-09-28 DIAGNOSIS — I10 Essential (primary) hypertension: Secondary | ICD-10-CM | POA: Insufficient documentation

## 2012-09-28 DIAGNOSIS — J449 Chronic obstructive pulmonary disease, unspecified: Secondary | ICD-10-CM | POA: Insufficient documentation

## 2012-09-28 DIAGNOSIS — Z79899 Other long term (current) drug therapy: Secondary | ICD-10-CM | POA: Insufficient documentation

## 2012-09-28 DIAGNOSIS — Z8679 Personal history of other diseases of the circulatory system: Secondary | ICD-10-CM | POA: Insufficient documentation

## 2012-09-28 DIAGNOSIS — Z8719 Personal history of other diseases of the digestive system: Secondary | ICD-10-CM | POA: Insufficient documentation

## 2012-09-28 DIAGNOSIS — J4489 Other specified chronic obstructive pulmonary disease: Secondary | ICD-10-CM | POA: Insufficient documentation

## 2012-09-28 DIAGNOSIS — Z8659 Personal history of other mental and behavioral disorders: Secondary | ICD-10-CM | POA: Insufficient documentation

## 2012-09-28 DIAGNOSIS — I252 Old myocardial infarction: Secondary | ICD-10-CM | POA: Insufficient documentation

## 2012-09-28 DIAGNOSIS — Z7901 Long term (current) use of anticoagulants: Secondary | ICD-10-CM | POA: Insufficient documentation

## 2012-09-28 DIAGNOSIS — Y838 Other surgical procedures as the cause of abnormal reaction of the patient, or of later complication, without mention of misadventure at the time of the procedure: Secondary | ICD-10-CM | POA: Insufficient documentation

## 2012-09-28 LAB — PROTIME-INR: INR: 1.04 (ref 0.00–1.49)

## 2012-09-28 LAB — TYPE AND SCREEN
ABO/RH(D): O NEG
Antibody Screen: NEGATIVE

## 2012-09-28 LAB — COMPREHENSIVE METABOLIC PANEL
ALT: 25 U/L (ref 0–53)
AST: 23 U/L (ref 0–37)
Albumin: 2.7 g/dL — ABNORMAL LOW (ref 3.5–5.2)
CO2: 29 mEq/L (ref 19–32)
Calcium: 9 mg/dL (ref 8.4–10.5)
GFR calc non Af Amer: >90 mL/min (ref >90)
Sodium: 137 mEq/L (ref 135–145)
Total Protein: 6.8 g/dL (ref 6.0–8.3)

## 2012-09-28 LAB — CBC WITH DIFFERENTIAL/PLATELET
Basophils Absolute: 0 10*3/uL (ref 0.0–0.1)
Basophils Relative: 0 % (ref 0–1)
Eosinophils Relative: 1 % (ref 0–5)
Lymphocytes Relative: 22 % (ref 12–46)
MCV: 97.6 fL (ref 78.0–100.0)
Neutro Abs: 5.1 10*3/uL (ref 1.7–7.7)
Platelets: 464 10*3/uL — ABNORMAL HIGH (ref 150–400)
RDW: 12.8 % (ref 11.5–15.5)
WBC: 7.8 10*3/uL (ref 4.0–10.5)

## 2012-09-28 LAB — ABO/RH: ABO/RH(D): O NEG

## 2012-09-28 LAB — POCT I-STAT, CHEM 8
Calcium, Ion: 1.24 mmol/L — ABNORMAL HIGH (ref 1.12–1.23)
HCT: 32 % — ABNORMAL LOW (ref 39.0–52.0)
Hemoglobin: 10.9 g/dL — ABNORMAL LOW (ref 13.0–17.0)
Sodium: 137 mEq/L (ref 135–145)
TCO2: 31 mmol/L (ref 0–100)

## 2012-09-28 MED ORDER — KETAMINE HCL 10 MG/ML IJ SOLN: 1.0000 mg/kg | Freq: Once | INTRAMUSCULAR | Status: DC

## 2012-09-28 MED ORDER — ROCURONIUM BROMIDE 50 MG/5ML IV SOLN
INTRAVENOUS | Status: AC
  Filled 2012-09-28: qty 2

## 2012-09-28 MED ORDER — LIDOCAINE HCL (CARDIAC) 20 MG/ML IV SOLN
INTRAVENOUS | Status: AC
  Filled 2012-09-28: qty 5

## 2012-09-28 MED ORDER — ETOMIDATE 2 MG/ML IV SOLN
INTRAVENOUS | Status: AC
  Filled 2012-09-28: qty 20

## 2012-09-28 MED ORDER — SUCCINYLCHOLINE CHLORIDE 20 MG/ML IJ SOLN
INTRAMUSCULAR | Status: AC
  Filled 2012-09-28: qty 1

## 2012-09-28 NOTE — Consult Note (Signed)
Reason for Consult:bleeding from the pharynx  Referring Physician: er   Trevor Griffin is an 57 y.o. male.  HPI: pt with recent tongue resection and radial free flap reconstruction on the 14th of May at [facility]. He had some bleeding from his trach site on Sunday and was taken to [facility]. They apparently sutured the stoma shut and send home. He now presents with excessive bleeding from the oral cavity. He had clot removed from the pharynx and he he dramatic improvement in his breathing. He had no SOB prior to the bleeding and now he has stabilized and feels well. He is sitting up in bed comfortable and talking the best he can. He has no chest pain. He has had previous cardiac stents. He currently has no bleeding.   Past Medical History  Diagnosis Date  . History of tobacco abuse     quit Oct '12  . Hyperlipidemia   . Ischemic cardiomyopathy     Echo 08/20/11 with EF 50-55% (previously 35%)  . Hypertension   . Obesity   . Depression   . CAD (coronary artery disease)     Ant STEMI 5/08 with 4 Taxus DES proximal to mid LAD. 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 Cath in 2/09 also showed 80% mid to distal CFX stenosis. Myoview (6/11): EF 37%, large anteroapical infarct with no ischemia. [place] 10/12: LHC with prox to mid LAD stent ok with 10-20% ISR, mLAD 40%, CFX 30-40%, mCFX 90%, EF 35% - DES to mCFX  . GERD (gastroesophageal reflux disease)   . COPD (chronic obstructive pulmonary disease)   . Alcohol abuse   . Fatigue     sleep test neg 2008  . Anterior myocardial infarction 2008  . Palpitations     For event monitor 01/2012    Past Surgical History  Procedure Laterality Date  . Cardiac catheterization      ANTERIOR STEMI W/4  OVERLAPPING TAXUS STENTS IN THE PROXIMAL TO MID LAD    Family History  Problem Relation Age of Onset  . Kidney disease Mother     CHRONIC  . Cancer Sister   . Liver disease Sister    LIVER FAILURE    Social History:  reports that he quit smoking about 19 months ago. His smoking use included Cigarettes. He smoked 2.00 packs per day. He does not have any smokeless tobacco history on file. He reports that  drinks alcohol. He reports that he does not use illicit drugs.  Allergies:  Allergies  Allergen Reactions  . Hydromorphone Other (See Comments)    Headache,nausea    Medications: I have reviewed the patient's current medications.  Results for orders placed during the hospital encounter of 09/28/12 (from the past 48 hour(s))  CBC WITH DIFFERENTIAL     Status: Abnormal   Collection Time    09/28/12  8:48 PM      Result Value Range   WBC 7.8  4.0 - 10.5 K/uL   RBC 3.31 (*) 4.22 - 5.81 MIL/uL   Hemoglobin 10.9 (*) 13.0 - 17.0 g/dL   HCT 16.1 (*) 09.6 - 04.5 %   MCV 97.6  78.0 - 100.0 fL   MCH 32.9  26.0 - 34.0 pg   MCHC 33.7  30.0 - 36.0 g/dL   RDW 40.9  81.1 - 91.4 %   Platelets 464 (*) 150 - 400 K/uL   Neutrophils Relative % 65  43 -  77 %   Neutro Abs 5.1  1.7 - 7.7 K/uL   Lymphocytes Relative 22  12 - 46 %   Lymphs Abs 1.7  0.7 - 4.0 K/uL   Monocytes Relative 12  3 - 12 %   Monocytes Absolute 1.0  0.1 - 1.0 K/uL   Eosinophils Relative 1  0 - 5 %   Eosinophils Absolute 0.1  0.0 - 0.7 K/uL   Basophils Relative 0  0 - 1 %   Basophils Absolute 0.0  0.0 - 0.1 K/uL  APTT     Status: None   Collection Time    09/28/12  8:48 PM      Result Value Range   aPTT 26  24 - 37 seconds  PROTIME-INR     Status: None   Collection Time    09/28/12  8:48 PM      Result Value Range   Prothrombin Time 13.5  11.6 - 15.2 seconds   INR 1.04  0.00 - 1.49  TYPE AND SCREEN     Status: None   Collection Time    09/28/12  9:00 PM      Result Value Range   ABO/RH(D) O NEG     Antibody Screen PENDING     Sample Expiration 10/01/2012    POCT I-STAT, CHEM 8     Status: Abnormal   Collection Time    09/28/12  9:00 PM      Result Value Range   Sodium 137  135 - 145 mEq/L    Potassium 4.3  3.5 - 5.1 mEq/L   Chloride 101  96 - 112 mEq/L   BUN 14  6 - 23 mg/dL   Creatinine, Ser 1.61  0.50 - 1.35 mg/dL   Glucose, Bld 80  70 - 99 mg/dL   Calcium, Ion 0.96 (*) 1.12 - 1.23 mmol/L   TCO2 31  0 - 100 mmol/L   Hemoglobin 10.9 (*) 13.0 - 17.0 g/dL   HCT 04.5 (*) 40.9 - 81.1 %    Dg Neck Soft Tissue  09/27/2012   *RADIOLOGY REPORT*  Clinical Data: Recent tracheostomy placed tracheostomy removed 1 week ago.  Short of breath  NECK SOFT TISSUES - 1+ VIEW  Comparison: None.  Findings: Single lateral projection demonstrates normal oral pharynx and hypopharynx.  The epiglottis is normal.  No abnormality of the larynx node on the lateral projection.  No prevertebral soft tissue swelling.  Multiple vascular clips noted in the neck.  IMPRESSION: No acute abnormality in soft tissue exam of the neck.   Original Report Authenticated By: Genevive Bi, M.D.   Dg Chest 2 View  09/27/2012   *RADIOLOGY REPORT*  Clinical Data: Short of breath  CHEST - 2 VIEW  Comparison: Chest radiograph 01/21/2012  Findings: Normal mediastinum and heart silhouette.  There is chronic bronchitic markings centrally.  No effusion, infiltrate, or pneumothorax.  IMPRESSION: No acute cardiopulmonary findings.  Chronic bronchitic markings   Original Report Authenticated By: Genevive Bi, M.D.   Dg Chest Port 1 View  09/28/2012   *RADIOLOGY REPORT*  Clinical Data: Evaluate for potential aspiration.  Bleeding after oral surgery.  PORTABLE CHEST - 1 VIEW  Comparison: Chest x-ray 09/27/2012.  Findings: Lung volumes are normal.  No acute consolidative airspace disease.  Mild diffuse interstitial prominence and peribronchial cuffing, similar to multiple prior examinations.  No evidence of pulmonary edema.  Heart size is normal.  Mediastinal contours are unremarkable.  Surgical clips project over the lower cervical region  bilaterally.  IMPRESSION: 1.  No radiographic evidence of acute cardiopulmonary disease.  The  appearance of chest is unchanged, as above.   Original Report Authenticated By: Trudie Reed, M.D.    ROS Blood pressure 101/60, pulse 70, resp. rate 23, weight 65.4 kg (144 lb 2.9 oz), SpO2 100.00%. Physical Exam  Constitutional: He appears well-developed.  He is breathing well. Sitting up in bed comfortable and no distress.   HENT:  He has right sided free flap which looks viable. There is a clot in the right posterior pharynx about at the posterior edge of the wound and on the tonsillar fossa area. There is no active bleeding. There is no excessive swelling. Tongue appears to move. Good airway.   Neck:  Well healing wounds. Janina Mayo site has some silk sutures with some mild bloody mucus coming from the remaining fistula. No swelling in the neck or evidence of hematoma.   Cardiovascular: Normal rate.   Respiratory: Effort normal.    Assessment/Plan: Pharyngeal bleeding- he is s/p subtotal glossectomy and radial forearm reconstruction by Dr. Manson Passey at [facility]. He has had some bleeding which is currently under control.  I have discussed case with Dr. Lyn Hollingshead who accepts transfer to [facility]. Pt family and patient agree to transfer and want to be there for care.   Suzanna Obey 09/28/2012, 9:27 PM

## 2012-09-28 NOTE — ED Notes (Signed)
Dr Jearld Fenton at bedside with family and patient.

## 2012-09-28 NOTE — ED Notes (Signed)
Per family, patient is bleeding from tongue.

## 2012-09-28 NOTE — Telephone Encounter (Signed)
Patient's wife called [facility] after getting patient home from [place], with hypotension.  [facility] had no orders for resuming care.  Ann , RN was sent out to evaluate patient, who was hypotensice and bleeding from the mouth,.  She called 911 and patient was taken to [facility] for stabilization and urgent ENT eval with plans to transfer back to [place]

## 2012-09-28 NOTE — ED Provider Notes (Signed)
History     CSN: [id number]  Arrival date & time 09/28/12  2037   First MD Initiated Contact with Patient 09/28/12 2054      Chief Complaint  Patient presents with  . Bleeding/Bruising    (Consider location/radiation/quality/duration/timing/severity/associated sxs/prior treatment) HPI Comments: 57 y.o. male who presents to [place] via EMS after bleeding in his mouth. Pt is s/p resection of partial tongue w/ flap. Per his wife, suddenly started to have bleeding near the flap site. Per ems, he has been bleeding without resolution of symptoms, and now has significant clot formation in his mouth.   Patient is a 57 y.o. male presenting with general illness. The history is provided by the EMS personnel and the spouse. The history is limited by the condition of the patient.  Illness Location:  Mouth Severity:  Mild Onset quality:  Sudden Timing:  Constant Progression:  Worsening Chronicity:  Recurrent Associated symptoms: no abdominal pain, no chest pain and no congestion     Past Medical History  Diagnosis Date  . History of tobacco abuse     quit Oct '12  . Hyperlipidemia   . Ischemic cardiomyopathy     Echo 08/20/11 with EF 50-55% (previously 35%)  . Hypertension   . Obesity   . Depression   . CAD (coronary artery disease)     Ant STEMI 5/08 with 4 Taxus DES proximal to mid LAD. 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 Cath in 2/09 also showed 80% mid to distal CFX stenosis. Myoview (6/11): EF 37%, large anteroapical infarct with no ischemia. [place] 10/12: LHC with prox to mid LAD stent ok with 10-20% ISR, mLAD 40%, CFX 30-40%, mCFX 90%, EF 35% - DES to mCFX  . GERD (gastroesophageal reflux disease)   . COPD (chronic obstructive pulmonary disease)   . Alcohol abuse   . Fatigue     sleep test neg 2008  . Anterior myocardial infarction 2008  . Palpitations     For event monitor 01/2012    Past Surgical History   Procedure Laterality Date  . Cardiac catheterization      ANTERIOR STEMI W/4  OVERLAPPING TAXUS STENTS IN THE PROXIMAL TO MID LAD    Family History  Problem Relation Age of Onset  . Kidney disease Mother     CHRONIC  . Cancer Sister   . Liver disease Sister     LIVER FAILURE    History  Substance Use Topics  . Smoking status: Former Smoker -- 2.00 packs/day    Types: Cigarettes    Quit date: 02/03/2011  . Smokeless tobacco: Not on file  . Alcohol Use: Yes     Comment: 8-12 BEERS DAILY      Review of Systems  Unable to perform ROS: Acuity of condition  HENT: Negative for congestion.   Cardiovascular: Negative for chest pain.  Gastrointestinal: Negative for abdominal pain.    Allergies  Hydromorphone  Home Medications   Current Outpatient Rx  Name  Route  Sig  Dispense  Refill  . acetaminophen (TYLENOL) 325 MG tablet   Oral   Take 650 mg by mouth every 6 (six) hours as needed for pain.         Marland Kitchen atorvastatin (LIPITOR) 80 MG tablet   Oral   Take 80 mg by mouth daily.           . bacitracin 500 UNIT/GM ointment   Topical  Apply 1 application topically 2 (two) times daily.         . bisoprolol (ZEBETA) 10 MG tablet   Oral   Take 15 mg by mouth daily. Takes 1.5 tablet         . buPROPion (WELLBUTRIN SR) 150 MG 12 hr tablet   Oral   Take 150 mg by mouth 2 (two) times daily.         . clopidogrel (PLAVIX) 75 MG tablet   Oral   Take 75 mg by mouth daily.         Marland Kitchen HYDROcodone-acetaminophen (HYCET) 7.5-325 mg/15 ml solution   Oral   Take 20 mLs by mouth 4 (four) times daily as needed for pain.         Marland Kitchen lidocaine (XYLOCAINE) 2 % solution   Oral   Take 20 mLs by mouth as needed for pain.   100 mL   1   . lisinopril (PRINIVIL,ZESTRIL) 10 MG tablet   Oral   Take 10 mg by mouth daily.         Marland Kitchen LORazepam (ATIVAN) 0.5 MG tablet   Oral   Take 0.5 mg by mouth every 8 (eight) hours as needed for anxiety.         . Multiple  Vitamins-Minerals (MULTIVITAMIN WITH MINERALS) tablet   Oral   Take 1 tablet by mouth daily.           . nitroGLYCERIN (NITROSTAT) 0.4 MG SL tablet   Sublingual   Place 1 tablet (0.4 mg total) under the tongue every 5 (five) minutes as needed (up to 3 doses). For chest pain         . oxyCODONE-acetaminophen (PERCOCET/ROXICET) 5-325 MG per tablet   Oral   Take 1 tablet by mouth every 4 (four) hours as needed for pain.         . polyethylene glycol (MIRALAX / GLYCOLAX) packet   Oral   Take 17 g by mouth daily as needed (constipation).         Marland Kitchen spironolactone (ALDACTONE) 25 MG tablet   Oral   Take 12.5 mg by mouth daily.          . traZODone (DESYREL) 50 MG tablet   Oral   Take 1 tablet (50 mg total) by mouth at bedtime.   90 tablet   3     Wt 144 lb 2.9 oz (65.4 kg)  BMI 23.99 kg/m2  Physical Exam  Constitutional: He appears distressed.  HENT:  Head: Normocephalic.  Pt has large amount of clot in mouth, and oropharynx.   Eyes: Pupils are equal, round, and reactive to light. Right eye exhibits no discharge. Left eye exhibits no discharge.  Neck: Neck supple.  Pt with sutures closing his trach site  Cardiovascular: Normal rate.   Pulmonary/Chest: No respiratory distress. He has no wheezes.  Abdominal: Soft. He exhibits no distension. There is no tenderness.  Musculoskeletal: He exhibits no edema.  Neurological: He is alert. No cranial nerve deficit.  Skin: There is pallor.    ED Course  Procedures (including critical care time)  Labs Reviewed  CBC WITH DIFFERENTIAL  COMPREHENSIVE METABOLIC PANEL  APTT  PROTIME-INR  TYPE AND SCREEN   Dg Neck Soft Tissue  09/27/2012   *RADIOLOGY REPORT*  Clinical Data: Recent tracheostomy placed tracheostomy removed 1 week ago.  Short of breath  NECK SOFT TISSUES - 1+ VIEW  Comparison: None.  Findings: Single lateral projection demonstrates normal oral pharynx and  hypopharynx.  The epiglottis is normal.  No abnormality  of the larynx node on the lateral projection.  No prevertebral soft tissue swelling.  Multiple vascular clips noted in the neck.  IMPRESSION: No acute abnormality in soft tissue exam of the neck.   Original Report Authenticated By: Genevive Bi, M.D.   Dg Chest 2 View  09/27/2012   *RADIOLOGY REPORT*  Clinical Data: Short of breath  CHEST - 2 VIEW  Comparison: Chest radiograph 01/21/2012  Findings: Normal mediastinum and heart silhouette.  There is chronic bronchitic markings centrally.  No effusion, infiltrate, or pneumothorax.  IMPRESSION: No acute cardiopulmonary findings.  Chronic bronchitic markings   Original Report Authenticated By: Genevive Bi, M.D.     MDM  Pt with significant clot burden in his mouth, and actively bleeding. We have removed significant clot burden from his mouth, and this stops the bleeding. ENT is emergently consulted as well.   ENT has evaluated pt, and pt continues to not have re-bleed. Pt is transferred to [facility] to be eval by ENT team due to significant amount of bleeding he sustained. Pt was initially hypotensive upon arrival, but after 3L NS, he became normotensive. He had good mentation throughout this. Pt is evaluated prior to transfer, and he is talking, does not feel blood is in the back of his throat, and he has no signs of impending airway compromise.    1. Post-op bleeding, initial encounter             Bernadene Person, MD 09/28/12 [id number]

## 2012-09-28 NOTE — ED Notes (Signed)
Patient, family and Dr Jearld Fenton made decision of patient to be transferred to [facility]

## 2012-09-28 NOTE — Progress Notes (Signed)
Chaplain Note: Provided spiritual and emotional support for pt's family at bedside.   Rutherford Nail Chaplain

## 2012-09-28 NOTE — ED Notes (Signed)
Patient suctioned by RT,EDP and resident for three large blood clots from mouth.  Patient is feeling some relief and bleeding seems to have slowed at this point per Dr Bernette Mayers.

## 2012-09-28 NOTE — ED Notes (Signed)
Patient with mouth CA, went to [facility] last night for bleeding, trach stoma was bleeding, trach was stitched.  Patient now with oral and trach bleeding at this time.  Patient is CAOx3, 90/60, HR 100.

## 2012-09-30 NOTE — ED Provider Notes (Signed)
I saw and evaluated the patient, reviewed the resident's note and I agree with the findings and plan.  Pt with recent tongue resection and forearm graft, presents with bleeding from graft area. Large amount of clot removed from oropharynx. Bleeding controlled now, airway intact. ENT has evaluated the patient in the ED and transfer back to [facility] arranged.   CRITICAL CARE Performed by: Pollyann Savoy Total critical care time: 45 Critical care time was exclusive of separately billable procedures and treating other patients. Critical care was necessary to treat or prevent imminent or life-threatening deterioration. Critical care was time spent personally by me on the following activities: development of treatment plan with patient and/or surrogate as well as nursing, discussions with consultants, evaluation of patient's response to treatment, examination of patient, obtaining history from patient or surrogate, ordering and performing treatments and interventions, ordering and review of laboratory studies, ordering and review of radiographic studies, pulse oximetry and re-evaluation of patient's condition.   Charles B. Bernette Mayers, MD 09/30/12 2356

## 2012-10-11 ENCOUNTER — Encounter: Payer: Self-pay | Admitting: Radiation Oncology

## 2012-10-11 NOTE — Progress Notes (Signed)
Spoke to patient regarding out of network status with Community education officer and cost for visit.  Patient was okay with this and gave him the following information:  [employer] plan covers 40% coins; $0 copay; ind deductible $3000 ($1500 remaining); ind coins $10,000 ($5,000 remaining).  Patient confirmed appointment time with me as well.

## 2012-10-12 ENCOUNTER — Ambulatory Visit
Admission: RE | Admit: 2012-10-12 | Discharge: 2012-10-12 | Disposition: A | Discharge status: Home / Self Care | Payer: Managed Care, Other (non HMO) | Source: Ambulatory Visit | Attending: Radiation Oncology | Admitting: Radiation Oncology

## 2012-10-12 ENCOUNTER — Encounter: Payer: Self-pay | Admitting: Radiation Oncology

## 2012-10-12 VITALS — BP 90/65 | HR 89 | Temp 97.5°F | Ht 65.0 in | Wt 143.3 lb

## 2012-10-12 DIAGNOSIS — C023 Malignant neoplasm of anterior two-thirds of tongue, part unspecified: Secondary | ICD-10-CM

## 2012-10-12 DIAGNOSIS — J449 Chronic obstructive pulmonary disease, unspecified: Secondary | ICD-10-CM | POA: Insufficient documentation

## 2012-10-12 DIAGNOSIS — E785 Hyperlipidemia, unspecified: Secondary | ICD-10-CM | POA: Insufficient documentation

## 2012-10-12 DIAGNOSIS — R634 Abnormal weight loss: Secondary | ICD-10-CM | POA: Insufficient documentation

## 2012-10-12 DIAGNOSIS — Z931 Gastrostomy status: Secondary | ICD-10-CM | POA: Insufficient documentation

## 2012-10-12 DIAGNOSIS — I2510 Atherosclerotic heart disease of native coronary artery without angina pectoris: Secondary | ICD-10-CM | POA: Insufficient documentation

## 2012-10-12 DIAGNOSIS — J4489 Other specified chronic obstructive pulmonary disease: Secondary | ICD-10-CM | POA: Insufficient documentation

## 2012-10-12 DIAGNOSIS — C029 Malignant neoplasm of tongue, unspecified: Secondary | ICD-10-CM

## 2012-10-12 DIAGNOSIS — Z79899 Other long term (current) drug therapy: Secondary | ICD-10-CM | POA: Insufficient documentation

## 2012-10-12 DIAGNOSIS — Z87891 Personal history of nicotine dependence: Secondary | ICD-10-CM | POA: Insufficient documentation

## 2012-10-12 DIAGNOSIS — I10 Essential (primary) hypertension: Secondary | ICD-10-CM | POA: Insufficient documentation

## 2012-10-12 DIAGNOSIS — I252 Old myocardial infarction: Secondary | ICD-10-CM | POA: Insufficient documentation

## 2012-10-12 HISTORY — DX: Malignant neoplasm of tongue, unspecified: C02.9

## 2012-10-12 NOTE — Progress Notes (Signed)
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  Osmolite 1.5.  2 cans daily  Tobacco/Marijuana/Snuff/ETOH use: Stopped smoking cigarettes in 2012, and stooped smoking cigars the day before his surgery.  8-12 BEERS DAILY prior to surgery.  2 beers daily at present time.  Marijuana and cocaine in the 70's and 80's  Past/Anticipated interventions by otolaryngology, if any: Bilateral selective neck dissection, subtotal glossectomy with left radial forearm fasciocutaneous free flap reconstruction, split thickness skin graft left thigh.  Tracheotomy  Past/Anticipated interventions by medical oncology, if any: Unknown  Referrals yet, to any of the following?  Social Work?   Dentistry? No  Swallowing therapy?  Trevor Griffin  Nutrition? Trevor Griffin  Med/Onc? ???  PEG placement? In Place  SAFETY ISSUES:  Prior radiation? No  Pacemaker/ICD? No  Possible current pregnancy? No  Is the patient on methotrexate? No  Current Complaints / other details:

## 2012-10-12 NOTE — Progress Notes (Signed)
No note intended

## 2012-10-12 NOTE — Progress Notes (Addendum)
Radiation Oncology         [phone] ________________________________  Initial outpatient Consultation  Name: Trevor Griffin MRN: [id number]  Date: 10/12/2012  DOB: 1955-06-14  CC:Trevor Linger, MD  Trevor Nevins, MD   REFERRING PHYSICIAN: [name], MD  DIAGNOSIS: Pathologic T3 N1 squamous cell carcinoma; right oral tongue; grade 2  HISTORY OF PRESENT ILLNESS::Trevor Griffin is a 57 y.o. male who presented earlier this year with tongue pain. His primary Dr. appreciated a tongue lesion. The patient was ultimately referred to [facility] for surgical care.  At [facility] under the care of Dr. Erroll Luna, he underwent tracheotomy, bilateral selective neck dissection, subtotal glossectomy with left radial forearm fasicocutaneous free flap reconstruction with split-thickness skin graft from left thigh on 09/16/2012.  1/29 LN+ (positive from the zone 2/3 contents of the right); there was no extranodal extension;  margins negative for invasive disease but positive dysplasia margin at the floor of mouth. There is also perineural invasion. The right oral tongue tumor size 4.4 cm, demonstrating a grade 2 squamous cell carcinoma.   The patient was seen in in the emergency department in [place] on 09/27/2012 with complaints of bleeding from the oral cavity and pharynx. When he saw Dr. Jearld Fenton of otolaryngology the bleeding was under control at assessment. X-ray views of the soft tissues of the neck showed no acute abnormality in the soft tissues. The patient was transferred to [facility]. He underwent CT angiography of the neck on 09/29/2012. This demonstrated no radiographic findings to explain his reported bleeding. There was a 4.5 cm right submandibular mass consistent with a seroma or hematoma.  Saw Dr. Erroll Luna on 10/07/12 - recheck planned in 2 weeks from then.  Doing well per Dr Erroll Luna at that time.  Reports 40 pound weight loss. He is drinking liquids and eating puddings. He  does have a PEG tube and in stills 2 cans of Osmolite daily. He stopped smoking cigarettes in 2012 and stop start smoking cigars before his surgery. He had 8-12 beers daily prior to surgery and currently drinks 2 beers daily. He used marijuana/cocaine in the 70s and 62s.   PREVIOUS RADIATION THERAPY: No  PAST MEDICAL HISTORY:  has a past medical history of History of tobacco abuse; Hyperlipidemia; Ischemic cardiomyopathy; Hypertension; Obesity; Depression; CAD (coronary artery disease); GERD (gastroesophageal reflux disease); COPD (chronic obstructive pulmonary disease); Alcohol abuse; Fatigue; Anterior myocardial infarction (2008); Palpitations; and Tongue cancer.    PAST SURGICAL HISTORY: Past Surgical History  Procedure Laterality Date  . Cardiac catheterization      ANTERIOR STEMI W/4  OVERLAPPING TAXUS STENTS IN THE PROXIMAL TO MID LAD    FAMILY HISTORY: family history includes Cancer in his sister; Kidney disease in his mother; and Liver disease in his sister.  SOCIAL HISTORY:  reports that he quit smoking about 20 months ago. His smoking use included Cigarettes. He smoked 2.00 packs per day. He does not have any smokeless tobacco history on file. He reports that  drinks alcohol. He reports that he does not use illicit drugs.  ALLERGIES: Hydromorphone  MEDICATIONS:  Current Outpatient Prescriptions  Medication Sig Dispense Refill  . atorvastatin (LIPITOR) 80 MG tablet Take 80 mg by mouth daily.        . bacitracin 500 UNIT/GM ointment Apply 1 application topically 2 (two) times daily.      . bisoprolol (ZEBETA) 10 MG tablet Take 15 mg by mouth daily. Takes 1.5 tablet      .  buPROPion (WELLBUTRIN SR) 150 MG 12 hr tablet Take 150 mg by mouth 2 (two) times daily.      . clopidogrel (PLAVIX) 75 MG tablet Take 75 mg by mouth daily.      Marland Kitchen HYDROcodone-acetaminophen (HYCET) 7.5-325 mg/15 ml solution Take 20 mLs by mouth 4 (four) times daily as needed for pain.      Marland Kitchen lisinopril  (PRINIVIL,ZESTRIL) 10 MG tablet Take 10 mg by mouth daily.      Marland Kitchen LORazepam (ATIVAN) 0.5 MG tablet Take 0.5 mg by mouth every 8 (eight) hours as needed for anxiety.      . Multiple Vitamins-Minerals (MULTIVITAMIN WITH MINERALS) tablet Take 1 tablet by mouth daily.        . nitroGLYCERIN (NITROSTAT) 0.4 MG SL tablet Place 1 tablet (0.4 mg total) under the tongue every 5 (five) minutes as needed (up to 3 doses). For chest pain      . oxyCODONE-acetaminophen (PERCOCET/ROXICET) 5-325 MG per tablet Take 1 tablet by mouth every 4 (four) hours as needed for pain.      . polyethylene glycol (MIRALAX / GLYCOLAX) packet Take 17 g by mouth daily as needed (constipation).      Marland Kitchen spironolactone (ALDACTONE) 25 MG tablet Take 12.5 mg by mouth daily.       . traZODone (DESYREL) 50 MG tablet Take 1 tablet (50 mg total) by mouth at bedtime.  90 tablet  3   No current facility-administered medications for this encounter.    REVIEW OF SYSTEMS:   Pertinent items are noted in HPI.   PHYSICAL EXAM:  height is 5\' 5"  (1.651 m) and weight is 143 lb 4.8 oz (65 kg). His temperature is 97.5 F (36.4 C). His blood pressure is 90/65 and his pulse is 89.   General: Alert and oriented, in no acute distress HEENT: Head is normocephalic. Pupils are equally round and reactive to light. Extraocular movements are intact. Oropharynx - edentulous; postoperative changes in right tongue/floor of mouth with no active bleeding.  Flap reconstruction, right tongue. No infection. Neck: b/l postsurgical scars healed well,  no palpable cervical or supraclavicular lymphadenopathy. No palpable seromas. Heart: distant sounds Chest: distant breath sounds, no obvious rhonchi wheezes or rales Abdomen: Soft, nontender, nondistended, with no rigidity or guarding. Extremities: No cyanosis or edema. Post operative changes appreciated from left forearm Lymphatics: No concerning lymphadenopathy. Skin: as above Psychiatric: Judgment and insight are  intact. Affect is appropriate.     LABORATORY DATA:  Lab Results  Component Value Date   WBC 7.8 09/28/2012   HGB 10.9* 09/28/2012   HCT 32.0* 09/28/2012   MCV 97.6 09/28/2012   PLT 464* 09/28/2012   CMP     Component Value Date/Time   NA 137 09/28/2012 2100   K 4.3 09/28/2012 2100   CL 101 09/28/2012 2100   CO2 29 09/28/2012 2048   GLUCOSE 80 09/28/2012 2100   BUN 14 09/28/2012 2100   CREATININE 0.70 09/28/2012 2100   CREATININE 0.86 01/21/2012 1418   CALCIUM 9.0 09/28/2012 2048   PROT 6.8 09/28/2012 2048   ALBUMIN 2.7* 09/28/2012 2048   AST 23 09/28/2012 2048   ALT 25 09/28/2012 2048   ALKPHOS 83 09/28/2012 2048   BILITOT 0.3 09/28/2012 2048   GFRNONAA >90 09/28/2012 2048   GFRAA >90 09/28/2012 2048         RADIOGRAPHY: Dg Neck Soft Tissue  09/27/2012   *RADIOLOGY REPORT*  Clinical Data: Recent tracheostomy placed tracheostomy removed 1 week ago.  Short of breath  NECK SOFT TISSUES - 1+ VIEW  Comparison: None.  Findings: Single lateral projection demonstrates normal oral pharynx and hypopharynx.  The epiglottis is normal.  No abnormality of the larynx node on the lateral projection.  No prevertebral soft tissue swelling.  Multiple vascular clips noted in the neck.  IMPRESSION: No acute abnormality in soft tissue exam of the neck.   Original Report Authenticated By: Genevive Bi, M.D.   Dg Chest 2 View  09/27/2012   *RADIOLOGY REPORT*  Clinical Data: Short of breath  CHEST - 2 VIEW  Comparison: Chest radiograph 01/21/2012  Findings: Normal mediastinum and heart silhouette.  There is chronic bronchitic markings centrally.  No effusion, infiltrate, or pneumothorax.  IMPRESSION: No acute cardiopulmonary findings.  Chronic bronchitic markings   Original Report Authenticated By: Genevive Bi, M.D.   Dg Chest Port 1 View  09/28/2012   *RADIOLOGY REPORT*  Clinical Data: Evaluate for potential aspiration.  Bleeding after oral surgery.  PORTABLE CHEST - 1 VIEW  Comparison: Chest x-ray  09/27/2012.  Findings: Lung volumes are normal.  No acute consolidative airspace disease.  Mild diffuse interstitial prominence and peribronchial cuffing, similar to multiple prior examinations.  No evidence of pulmonary edema.  Heart size is normal.  Mediastinal contours are unremarkable.  Surgical clips project over the lower cervical region bilaterally.  IMPRESSION: 1.  No radiographic evidence of acute cardiopulmonary disease.  The appearance of chest is unchanged, as above.   Original Report Authenticated By: Trudie Reed, M.D.      IMPRESSION/PLAN:  This is a delightful 57year-old gentleman with squamous cell carcinoma of the oral tongue. He is a good candidate for postoperative radiotherapy. Plan is as below:  1) Staging Scans - I do not see Staging scans in Care Everywhere [facility]) or in our system. Will order CT Chest/Abd with contrast.  2) Will refer to social work for social support  3) Will refer to nutrition for nutrition support - 40lb weight loss  4)  PEG tube is in place.  5) Will refer to swallowing therapy for dysphagia prevention  6)   I would like to start radiotherapy within 6 weeks of his surgery for best results, based on the medical literature (Ang et all, IJROBP 2001).  Will simulate him tomorrow for treatment planning. Anticipate treatment to Right oral tongue with margin and bilateral neck to total dose of 60 Gy/ 30 fractions.  7) The patient continues to drink ETOH but has cut down. The patient was counseled on the benefits of eventually abstaining for life from ETOH and was offered further counseling to help with this. The patient declined counseling at this time and is not motivated to quit, understanding that ETOH can compromise long term cure.  He will try to stop drinking during RT, however.  It was a pleasure meeting the patient today. We discussed the risks, benefits, and side effects of radiotherapy. He understands adjuvant radiotherapy will give him the best  chance of cure and local regional control.  We talked in detail about acute and late effects. He understands that some of the most bothersome acute effects will be significant soreness of the mouth and throat, changes in taste, changes in salivary function, skin irritation, hair loss, dehydration, weight loss and fatigue. We talked about late effects which include but are not necessarily limited to dysphagia, hypothyroidism, dry mouth, taste changes, and neck edema. No guarantees of treatment were given. A consent form was signed and placed in the patient's medical  record. The patient is enthusiastic about proceeding with treatment. I look forward to participating in the patient's care.  The patient was offered enrollment on our single institutional trial investigating open-faced vs close-face head/shoulder masks used to immobilize patients during head and neck radiotherapy. The patient has elected to enroll on this trial.  In regards to the trial, the patient has voluntarily signed copies of the consent forms and all trial related questions were answered.  60 minutes of face to face time was spent with the patient, over 50% of that on counseling and coordination of care. -----------------------------------  Lonie Peak, MD

## 2012-10-13 ENCOUNTER — Encounter: Payer: Self-pay | Admitting: Radiation Oncology

## 2012-10-13 ENCOUNTER — Other Ambulatory Visit: Payer: Self-pay | Admitting: Radiation Oncology

## 2012-10-13 ENCOUNTER — Ambulatory Visit
Admission: RE | Admit: 2012-10-13 | Discharge: 2012-10-13 | Disposition: A | Discharge status: Home / Self Care | Payer: Managed Care, Other (non HMO) | Source: Ambulatory Visit | Attending: Radiation Oncology | Admitting: Radiation Oncology

## 2012-10-13 VITALS — BP 104/73 | HR 89 | Temp 97.5°F | Resp 20 | Wt 145.0 lb

## 2012-10-13 DIAGNOSIS — B370 Candidal stomatitis: Secondary | ICD-10-CM | POA: Insufficient documentation

## 2012-10-13 DIAGNOSIS — I252 Old myocardial infarction: Secondary | ICD-10-CM | POA: Insufficient documentation

## 2012-10-13 DIAGNOSIS — C023 Malignant neoplasm of anterior two-thirds of tongue, part unspecified: Secondary | ICD-10-CM | POA: Insufficient documentation

## 2012-10-13 DIAGNOSIS — C029 Malignant neoplasm of tongue, unspecified: Secondary | ICD-10-CM

## 2012-10-13 DIAGNOSIS — J4489 Other specified chronic obstructive pulmonary disease: Secondary | ICD-10-CM | POA: Insufficient documentation

## 2012-10-13 DIAGNOSIS — J449 Chronic obstructive pulmonary disease, unspecified: Secondary | ICD-10-CM | POA: Insufficient documentation

## 2012-10-13 DIAGNOSIS — E669 Obesity, unspecified: Secondary | ICD-10-CM | POA: Insufficient documentation

## 2012-10-13 DIAGNOSIS — Z510 Encounter for antineoplastic radiation therapy: Secondary | ICD-10-CM | POA: Insufficient documentation

## 2012-10-13 DIAGNOSIS — K1231 Oral mucositis (ulcerative) due to antineoplastic therapy: Secondary | ICD-10-CM | POA: Insufficient documentation

## 2012-10-13 DIAGNOSIS — K219 Gastro-esophageal reflux disease without esophagitis: Secondary | ICD-10-CM | POA: Insufficient documentation

## 2012-10-13 DIAGNOSIS — I428 Other cardiomyopathies: Secondary | ICD-10-CM | POA: Insufficient documentation

## 2012-10-13 DIAGNOSIS — Z87891 Personal history of nicotine dependence: Secondary | ICD-10-CM | POA: Insufficient documentation

## 2012-10-13 DIAGNOSIS — Y842 Radiological procedure and radiotherapy as the cause of abnormal reaction of the patient, or of later complication, without mention of misadventure at the time of the procedure: Secondary | ICD-10-CM | POA: Insufficient documentation

## 2012-10-13 DIAGNOSIS — Z930 Tracheostomy status: Secondary | ICD-10-CM | POA: Insufficient documentation

## 2012-10-13 DIAGNOSIS — K140 Glossitis: Secondary | ICD-10-CM | POA: Insufficient documentation

## 2012-10-13 DIAGNOSIS — E785 Hyperlipidemia, unspecified: Secondary | ICD-10-CM | POA: Insufficient documentation

## 2012-10-13 DIAGNOSIS — C770 Secondary and unspecified malignant neoplasm of lymph nodes of head, face and neck: Secondary | ICD-10-CM | POA: Insufficient documentation

## 2012-10-13 DIAGNOSIS — Z79899 Other long term (current) drug therapy: Secondary | ICD-10-CM | POA: Insufficient documentation

## 2012-10-13 DIAGNOSIS — I10 Essential (primary) hypertension: Secondary | ICD-10-CM | POA: Insufficient documentation

## 2012-10-13 MED ORDER — SODIUM CHLORIDE 0.9 % IJ SOLN
10.0000 mL | Freq: Once | INTRAMUSCULAR | Status: AC
  Administered 2012-10-13: 10 mL via INTRAVENOUS

## 2012-10-13 NOTE — Progress Notes (Signed)
Patient arrived 835am, latyte,gave name,dob as idientification, not allergic to sulfa, not a diabetic, bun=14 /cr  ..61on 5/27 Attempted Iv start  Unsuccessful, called amy charge nurse  In med onc requesting help,

## 2012-10-13 NOTE — Progress Notes (Signed)
#  22 gauge IV removed intact from right forearm.  Pressure and band aid applied.  Rick, the head and neck navigator meeting with Mr. Ducharme.

## 2012-10-14 ENCOUNTER — Ambulatory Visit (HOSPITAL_COMMUNITY): Payer: Managed Care, Other (non HMO)

## 2012-10-15 ENCOUNTER — Encounter (HOSPITAL_COMMUNITY): Payer: Self-pay

## 2012-10-15 ENCOUNTER — Telehealth: Payer: Self-pay | Admitting: *Deleted

## 2012-10-15 ENCOUNTER — Other Ambulatory Visit: Payer: Self-pay | Admitting: Radiation Oncology

## 2012-10-15 ENCOUNTER — Ambulatory Visit (HOSPITAL_COMMUNITY)
Admission: RE | Admit: 2012-10-15 | Discharge: 2012-10-15 | Disposition: A | Discharge status: Home / Self Care | Payer: Managed Care, Other (non HMO) | Source: Ambulatory Visit | Attending: Radiation Oncology | Admitting: Radiation Oncology

## 2012-10-15 DIAGNOSIS — R911 Solitary pulmonary nodule: Secondary | ICD-10-CM | POA: Insufficient documentation

## 2012-10-15 DIAGNOSIS — I2510 Atherosclerotic heart disease of native coronary artery without angina pectoris: Secondary | ICD-10-CM | POA: Insufficient documentation

## 2012-10-15 DIAGNOSIS — I700 Atherosclerosis of aorta: Secondary | ICD-10-CM | POA: Insufficient documentation

## 2012-10-15 DIAGNOSIS — Z931 Gastrostomy status: Secondary | ICD-10-CM | POA: Insufficient documentation

## 2012-10-15 DIAGNOSIS — C029 Malignant neoplasm of tongue, unspecified: Secondary | ICD-10-CM | POA: Insufficient documentation

## 2012-10-15 DIAGNOSIS — N289 Disorder of kidney and ureter, unspecified: Secondary | ICD-10-CM | POA: Insufficient documentation

## 2012-10-15 DIAGNOSIS — K5730 Diverticulosis of large intestine without perforation or abscess without bleeding: Secondary | ICD-10-CM | POA: Insufficient documentation

## 2012-10-15 DIAGNOSIS — K8020 Calculus of gallbladder without cholecystitis without obstruction: Secondary | ICD-10-CM | POA: Insufficient documentation

## 2012-10-15 MED ORDER — IOHEXOL 300 MG/ML  SOLN
80.0000 mL | Freq: Once | INTRAMUSCULAR | Status: AC | PRN
  Administered 2012-10-15: 80 mL via INTRAVENOUS

## 2012-10-15 NOTE — Telephone Encounter (Signed)
Called Kathrin Penner to arrange a social work consult for this patient, lvm for a return call

## 2012-10-21 ENCOUNTER — Encounter: Payer: Self-pay | Admitting: *Deleted

## 2012-10-21 NOTE — Progress Notes (Signed)
An attempt was made to reach Mr. Longo, but contact was not made. A voicemail message was left.   Gretta Cool, LCSW Assistant Director Clinical Social Work [phone]

## 2012-10-22 ENCOUNTER — Telehealth: Payer: Self-pay | Admitting: *Deleted

## 2012-10-22 NOTE — Telephone Encounter (Signed)
Trevor Griffin called and informed that his recent CT scan results were good as stated by Dr. Basilio Cairo.  He expressed thanks,

## 2012-10-25 ENCOUNTER — Encounter: Payer: Self-pay | Admitting: *Deleted

## 2012-10-25 ENCOUNTER — Encounter: Payer: Self-pay | Admitting: Radiation Oncology

## 2012-10-25 ENCOUNTER — Ambulatory Visit
Admission: RE | Admit: 2012-10-25 | Discharge: 2012-10-25 | Disposition: A | Discharge status: Home / Self Care | Payer: Managed Care, Other (non HMO) | Source: Ambulatory Visit | Attending: Radiation Oncology | Admitting: Radiation Oncology

## 2012-10-25 VITALS — BP 118/75 | HR 69 | Resp 18 | Wt 146.1 lb

## 2012-10-25 DIAGNOSIS — C029 Malignant neoplasm of tongue, unspecified: Secondary | ICD-10-CM

## 2012-10-25 DIAGNOSIS — C023 Malignant neoplasm of anterior two-thirds of tongue, part unspecified: Secondary | ICD-10-CM

## 2012-10-25 NOTE — Progress Notes (Signed)
Met pt and his wife prior to his RT; spent time with wife in RT waiting area while pt having tmt.  Answered wife's questions about SE of RT, talked about upcoming appt with Speech Pathology; provided emotional support.

## 2012-10-25 NOTE — Progress Notes (Signed)
Chart note: The patient began his head neck IMRT today, and is being treated to 8.9 delivered field widths corresponding to one set of IMRT treatment devices [phone]).

## 2012-10-25 NOTE — Progress Notes (Signed)
Speech garbled. Edema of anterior neck noted. Reports seeing surgeon last Wednesday and being told all side effects "are normal and to be expected." Reports sore places and areas of numbness inside his mouth. Reports some foods are bland but, most foods taste as they should reports eating only soft foods by mouth such as mac and cheese and soup. Reports using feeding tube for medicine, mvi, and 1-2 cans of osmolite per day. Reports thick saliva is improving as he once had to carry a towel and use suction.

## 2012-10-25 NOTE — Progress Notes (Signed)
Weekly Management Note:  Site: Anterior tongue/bilateral neck Current Dose:  200  cGy Projected Dose: 6000  cGy  Narrative: The patient is seen today for routine under treatment assessment. CBCT/MVCT images/port films were reviewed. The chart was reviewed.   He is without complaints today. He gets most of his calories in by mouth, and does take 1-2 cans of Osmolite to supplement his diet.  Physical Examination:  Filed Vitals:   10/25/12 1723  BP: 118/75  Pulse: 69  Resp: 18  .  Weight: 146 lb 1.6 oz (66.271 kg). His speech is dysarthric. His neck wound is well-healed although there is a small scab along his tracheostomy site. Oral cavity remarkable for surgical changes with hair along the skin graft along the anterior floor of mouth.  Impression: Tolerating radiation therapy well.  Plan: Continue radiation therapy as planned.

## 2012-10-26 ENCOUNTER — Ambulatory Visit
Admission: RE | Admit: 2012-10-26 | Discharge: 2012-10-26 | Disposition: A | Discharge status: Home / Self Care | Payer: Managed Care, Other (non HMO) | Source: Ambulatory Visit | Attending: Radiation Oncology | Admitting: Radiation Oncology

## 2012-10-27 ENCOUNTER — Ambulatory Visit
Admission: RE | Admit: 2012-10-27 | Discharge: 2012-10-27 | Disposition: A | Discharge status: Home / Self Care | Payer: Managed Care, Other (non HMO) | Source: Ambulatory Visit | Attending: Radiation Oncology | Admitting: Radiation Oncology

## 2012-10-28 ENCOUNTER — Ambulatory Visit
Admission: RE | Admit: 2012-10-28 | Discharge: 2012-10-28 | Disposition: A | Discharge status: Home / Self Care | Payer: Managed Care, Other (non HMO) | Source: Ambulatory Visit | Attending: Radiation Oncology | Admitting: Radiation Oncology

## 2012-10-29 ENCOUNTER — Ambulatory Visit
Admission: RE | Admit: 2012-10-29 | Discharge: 2012-10-29 | Disposition: A | Discharge status: Home / Self Care | Payer: Managed Care, Other (non HMO) | Source: Ambulatory Visit | Attending: Radiation Oncology | Admitting: Radiation Oncology

## 2012-11-01 ENCOUNTER — Ambulatory Visit
Admission: RE | Admit: 2012-11-01 | Discharge: 2012-11-01 | Disposition: A | Discharge status: Home / Self Care | Payer: Managed Care, Other (non HMO) | Source: Ambulatory Visit | Attending: Radiation Oncology | Admitting: Radiation Oncology

## 2012-11-01 ENCOUNTER — Encounter: Payer: Self-pay | Admitting: Radiation Oncology

## 2012-11-01 ENCOUNTER — Ambulatory Visit: Payer: Managed Care, Other (non HMO)

## 2012-11-01 ENCOUNTER — Ambulatory Visit: Payer: Managed Care, Other (non HMO) | Attending: Radiation Oncology

## 2012-11-01 VITALS — BP 126/78 | HR 73 | Temp 97.3°F | Ht 65.0 in | Wt 147.7 lb

## 2012-11-01 DIAGNOSIS — C023 Malignant neoplasm of anterior two-thirds of tongue, part unspecified: Secondary | ICD-10-CM

## 2012-11-01 DIAGNOSIS — R1311 Dysphagia, oral phase: Secondary | ICD-10-CM | POA: Insufficient documentation

## 2012-11-01 DIAGNOSIS — IMO0001 Reserved for inherently not codable concepts without codable children: Secondary | ICD-10-CM | POA: Insufficient documentation

## 2012-11-01 MED ORDER — RADIAPLEXRX EX GEL: Freq: Once | CUTANEOUS | Status: DC

## 2012-11-01 NOTE — Progress Notes (Signed)
Called patient and instructed him not to use biafine since there was an alert about using this product due to parabens in the Biafine which could cause him to hve nausea and vomiting.  He stated he would bring it back on tomorrow. Will replace with Radiaplex Gel.

## 2012-11-01 NOTE — Progress Notes (Signed)
6 fractions to right tongue and bilateral neck.  He denies any pain.  Oral cavity is moist, but note white area on the roof of his mouth.   He has a peg feeding tube for medications only.  Eating soft foods, but no meat presently.  Given Radiation Therapy and You booklet with pages marked regarding pain, difficulty swallowing, skin care and dry mouth.  Given Biafine.  Will review on tomorrow with patient and family.

## 2012-11-02 ENCOUNTER — Ambulatory Visit
Admission: RE | Admit: 2012-11-02 | Discharge: 2012-11-02 | Disposition: A | Discharge status: Home / Self Care | Payer: Managed Care, Other (non HMO) | Source: Ambulatory Visit | Attending: Radiation Oncology | Admitting: Radiation Oncology

## 2012-11-02 ENCOUNTER — Encounter: Payer: Self-pay | Admitting: *Deleted

## 2012-11-02 NOTE — Progress Notes (Signed)
[facility] Clinical Social Work  Clinical Social Work was referred by Careers information officer for assessment of psychosocial needs.  Clinical Social Worker contacted patient by phone to offer support and assess for needs.  Mr. Manrique states he is "doing all right" now.  CSW discussed possibility of support services to relieve stress and help patient cope with diagnosis.  Mr. Strite states he "doesn't have time for that" and already attends too many medical appointments.  CSW strongly encouraged patient to utilize free counseling services if only for one visit.  The patient stated he is not interested.  CSW encouraged patient to contact CSW in the future if any needs arise.    Kathrin Penner, MSW, LCSW Clinical Social Worker [facility] [phone]

## 2012-11-02 NOTE — Progress Notes (Signed)
   Weekly Management Note:  outpatient Current Dose:  12 Gy  Projected Dose: 60 Gy   Narrative:  The patient presents for routine under treatment assessment.  CBCT/MVCT images/Port film x-rays were reviewed.  The chart was checked. No pain thus far. Eating and using PEG. Reports moodiness, short temper, not fully alleviated by ativan.  Requesting additional medication to help him cope with stress.  Physical Findings:  height is 5\' 5"  (1.651 m) and weight is 147 lb 11.2 oz (66.996 kg). His temperature is 97.3 F (36.3 C). His blood pressure is 126/78 and his pulse is 73.  NAD, no mucositis or thrush  Impression:  The patient is tolerating radiotherapy.  Plan:  Continue radiotherapy as planned. Will refer to social work to discuss counseling options for stress/ moodiness/ temper.  Can later consider additional medication if this is unhelpful.  ________________________________   Lonie Peak, M.D.

## 2012-11-03 ENCOUNTER — Encounter: Payer: Managed Care, Other (non HMO) | Admitting: Nutrition

## 2012-11-03 ENCOUNTER — Encounter: Payer: Self-pay | Admitting: Nutrition

## 2012-11-03 ENCOUNTER — Encounter: Payer: Self-pay | Admitting: *Deleted

## 2012-11-03 ENCOUNTER — Ambulatory Visit
Admission: RE | Admit: 2012-11-03 | Discharge: 2012-11-03 | Disposition: A | Discharge status: Home / Self Care | Payer: Managed Care, Other (non HMO) | Source: Ambulatory Visit | Attending: Radiation Oncology | Admitting: Radiation Oncology

## 2012-11-03 NOTE — Progress Notes (Signed)
Patient did not show up for scheduled nutrition appointment on Wednesday, 11/03/2012.

## 2012-11-03 NOTE — Progress Notes (Signed)
Met with pt's wife while he was receiving his scheduled IMRT and then spoke briefly with pt after his tmt.  Wife stated that pt is refusing counseling opportunity b/c "I don't need to talk to anybody"; and is refused to keep appt with Nutritionist that was scheduled today prior to IMRT b/c "I don't need it".  When talking with pt, I encouraged him to keep future appts with Nutritionist.  Pt (and his wife) stated he kept appt with Speech Therapist and pt indicated he is practicing the throat exercises.  I complimented him on this and encouraged him to keep up the good work.  Pt stated he has been feeling tired and I reeducated him that he would be experiencing fatigue r/t the RT.  I encouraged him to get as much exercise as he can tolerate (suggested early morning/late evening walks with his dogs) to build up his stamina.  Pt stated he understood.  I also reminded him that I am here to support him and that "I am just a phone call away".  He indicated he understood.  I will continue to follow.

## 2012-11-04 ENCOUNTER — Ambulatory Visit
Admission: RE | Admit: 2012-11-04 | Discharge: 2012-11-04 | Disposition: A | Discharge status: Home / Self Care | Payer: Managed Care, Other (non HMO) | Source: Ambulatory Visit | Attending: Radiation Oncology | Admitting: Radiation Oncology

## 2012-11-08 ENCOUNTER — Ambulatory Visit
Admission: RE | Admit: 2012-11-08 | Discharge: 2012-11-08 | Disposition: A | Discharge status: Home / Self Care | Payer: Managed Care, Other (non HMO) | Source: Ambulatory Visit | Attending: Radiation Oncology | Admitting: Radiation Oncology

## 2012-11-08 VITALS — BP 98/62 | HR 83 | Temp 97.4°F | Ht 65.0 in | Wt 148.3 lb

## 2012-11-08 DIAGNOSIS — C029 Malignant neoplasm of tongue, unspecified: Secondary | ICD-10-CM

## 2012-11-08 MED ORDER — OXYCODONE HCL 5 MG/5ML PO SOLN: ORAL | Status: DC

## 2012-11-08 MED ORDER — LORAZEPAM 2 MG PO TABS: 2.0000 mg | ORAL_TABLET | Freq: Three times a day (TID) | ORAL | Status: DC | PRN

## 2012-11-08 MED ORDER — FLUCONAZOLE 100 MG PO TABS: ORAL_TABLET | ORAL | Status: DC

## 2012-11-08 NOTE — Progress Notes (Signed)
Trevor Griffin here with his wife for weekly under treat visit.  He has had 9 fractions to his right tongue and neck.  He does have pain that he is rating a 10/10 in his mouth.  On inspection, his mouth is red and he has a white area on the right side of his tongue.  He is able to swallow fluids like soup.  He has been putting 2 cans of ensure plus through his peg tube a day.  He also needs a refill on his percocet.  He states his mouth is also dry.  The skin on his right neck is red.  He has been using radiaplex gel twice a day.

## 2012-11-09 ENCOUNTER — Ambulatory Visit
Admission: RE | Admit: 2012-11-09 | Discharge: 2012-11-09 | Disposition: A | Discharge status: Home / Self Care | Payer: Managed Care, Other (non HMO) | Source: Ambulatory Visit | Attending: Radiation Oncology | Admitting: Radiation Oncology

## 2012-11-09 NOTE — Progress Notes (Signed)
   Weekly Management Note:  outpatient Current Dose:  20 Gy  Projected Dose: 60 Gy   Narrative:  The patient presents for routine under treatment assessment.  CBCT/MVCT images/Port film x-rays were reviewed.  The chart was checked. He reports pain that he is rating as 10/10 in his mouth. He is able to swallow fluids like soup. He has been putting 2 cans of ensure plus through his peg tube a day.  He states his mouth is also dry. The skin on his right neck is red. He has been using radiaplex gel twice a day.   Physical Findings:  height is 5\' 5"  (1.651 m) and weight is 148 lb 4.8 oz (67.268 kg). His temperature is 97.4 F (36.3 C). His blood pressure is 98/62 and his pulse is 83.   his mouth is red and he has a white area on the right side of his tongue highly suggestive of thrush  Impression:  The patient is tolerating radiotherapy.    Plan:  Continue radiotherapy as planned. Difulcan Rxd for thrush. Oxycodone Elixir Rx'd. Ativan dosage increased as patient has recent exacerbation of  anxiety /temper issues and has deferred counseling via social work.  Family issues include 4yo adoptive son with traumatic history - will refer to social work to explore crucial services (ie counseling) for adoptive son as well as wife.   ________________________________   Lonie Peak, M.D.

## 2012-11-10 ENCOUNTER — Ambulatory Visit
Admission: RE | Admit: 2012-11-10 | Discharge: 2012-11-10 | Disposition: A | Discharge status: Home / Self Care | Payer: Managed Care, Other (non HMO) | Source: Ambulatory Visit | Attending: Radiation Oncology | Admitting: Radiation Oncology

## 2012-11-11 ENCOUNTER — Ambulatory Visit: Payer: Managed Care, Other (non HMO)

## 2012-11-12 ENCOUNTER — Ambulatory Visit
Admission: RE | Admit: 2012-11-12 | Discharge: 2012-11-12 | Disposition: A | Discharge status: Home / Self Care | Payer: Managed Care, Other (non HMO) | Source: Ambulatory Visit | Attending: Radiation Oncology | Admitting: Radiation Oncology

## 2012-11-14 ENCOUNTER — Other Ambulatory Visit: Payer: Self-pay | Admitting: Cardiology

## 2012-11-15 ENCOUNTER — Ambulatory Visit: Payer: Managed Care, Other (non HMO) | Admitting: Radiation Oncology

## 2012-11-15 ENCOUNTER — Ambulatory Visit: Payer: Managed Care, Other (non HMO)

## 2012-11-16 ENCOUNTER — Ambulatory Visit
Admission: RE | Admit: 2012-11-16 | Discharge: 2012-11-16 | Disposition: A | Discharge status: Home / Self Care | Payer: Managed Care, Other (non HMO) | Source: Ambulatory Visit | Attending: Radiation Oncology | Admitting: Radiation Oncology

## 2012-11-16 ENCOUNTER — Encounter: Payer: Self-pay | Admitting: *Deleted

## 2012-11-16 VITALS — BP 100/64 | HR 70 | Temp 97.7°F | Ht 65.0 in | Wt 150.9 lb

## 2012-11-16 DIAGNOSIS — C023 Malignant neoplasm of anterior two-thirds of tongue, part unspecified: Secondary | ICD-10-CM

## 2012-11-16 MED ORDER — OXYCODONE-ACETAMINOPHEN 10-325 MG PO TABS: 1.0000 | ORAL_TABLET | ORAL | Status: DC | PRN

## 2012-11-16 NOTE — Progress Notes (Signed)
Weekly Management Note:  Site: Anterior tongue/bilateral neck Current Dose:  2800  cGy Projected Dose: 5000  cGy  Narrative: The patient is seen today for routine under treatment assessment. CBCT/MVCT images/port films were reviewed. The chart was reviewed.   He still doing well and takes up to 1 Percocet a day for discomfort. He missed his treatment yesterday since his wife was ill and she is person he drives him to and from. He is taking 2 cans of ensure a day through his PEG tube. He is able to be soft foods. His weight is up to 1/2 pounds over the past week. He prefers Percocet which is much less expensive than oxycodone liquid. He uses Radioplex gel twice a day.  Physical Examination:  Filed Vitals:   11/16/12 1639  BP: 100/64  Pulse: 70  Temp: 97.7 F (36.5 C)  .  Weight: 150 lb 14.4 oz (68.448 kg). There is erythema along the neck bilaterally. On inspection of the oral cavity there is no candidiasis. There is a patchy mucositis.  Impression: Tolerating radiation therapy well. I emphasized to the patient that it is important to make every visit.  Plan: Continue radiation therapy as planned.

## 2012-11-16 NOTE — Progress Notes (Signed)
Trevor Griffin here with his wife for weekly under treat visit.  He has had 14 fractions to his right tongue.  He denies pain today but does have pain with swallowing.  He is taking in 2 cans of ensure per day per his peg tube.  He is also eating soft foods like pasta and starting to eat more solid foods.  He is requesting a refill on his pain medication.  He reports that the oxycodone liquid was $175 and the percocet pills were $6.  He has not been taking the liquid but has cutting his left over percocet pills in half and swallowing them.  He reports an improvement in the thickness of his saliva.  He denies nausea and hearing issues.  He does have fatigue.  His skin on his neck is pink.  He is using radiaplex gel twice a day.

## 2012-11-16 NOTE — Progress Notes (Signed)
CSW Kathrin Penner and I met with pt wife during pt's IMRT.  Discussed challenges and stress r/t diagnosis and tmts for pt as well as for family.  Lauren recognized that pt has refused previous offers of counseling services but that support is also intended for family; pt wife indicated she wd appreciate some support and Lauren responded she wd facilitate assistance, initially while he husband is here for tmts.  Lauren also offered to facilitate support services for pt and wife's 45 yr old soon-to-be-adopted son to address his needs.  In response to expressed financial concerns by pt's wife in part b/c pt's [employer] disability is yet effective, I facilitated scheduling of appt with Milon Dikes for financial counseling after pt's RT tomorrow, 7/16.  Both Lauren and I will continue to follow.

## 2012-11-17 ENCOUNTER — Ambulatory Visit
Admission: RE | Admit: 2012-11-17 | Discharge: 2012-11-17 | Disposition: A | Discharge status: Home / Self Care | Payer: Managed Care, Other (non HMO) | Source: Ambulatory Visit | Attending: Radiation Oncology | Admitting: Radiation Oncology

## 2012-11-17 ENCOUNTER — Encounter: Payer: Self-pay | Admitting: Radiation Oncology

## 2012-11-17 NOTE — Progress Notes (Signed)
Met with patient and his wife today, she stated the only income they are receiving is her disability.  He is currently trying to file for short term disability.  I gave patient information on the [facility] fund and the [employer].  Patient will bring needed info back at next appointment.

## 2012-11-18 ENCOUNTER — Ambulatory Visit
Admission: RE | Admit: 2012-11-18 | Discharge: 2012-11-18 | Disposition: A | Discharge status: Home / Self Care | Payer: Managed Care, Other (non HMO) | Source: Ambulatory Visit | Attending: Radiation Oncology | Admitting: Radiation Oncology

## 2012-11-19 ENCOUNTER — Ambulatory Visit
Admission: RE | Admit: 2012-11-19 | Discharge: 2012-11-19 | Disposition: A | Discharge status: Home / Self Care | Payer: Managed Care, Other (non HMO) | Source: Ambulatory Visit | Attending: Radiation Oncology | Admitting: Radiation Oncology

## 2012-11-22 ENCOUNTER — Encounter: Payer: Self-pay | Admitting: Radiation Oncology

## 2012-11-22 ENCOUNTER — Ambulatory Visit
Admission: RE | Admit: 2012-11-22 | Discharge: 2012-11-22 | Disposition: A | Discharge status: Home / Self Care | Payer: Managed Care, Other (non HMO) | Source: Ambulatory Visit | Attending: Radiation Oncology | Admitting: Radiation Oncology

## 2012-11-22 VITALS — BP 100/66 | HR 80 | Temp 98.2°F | Resp 18 | Wt 151.8 lb

## 2012-11-22 DIAGNOSIS — C029 Malignant neoplasm of tongue, unspecified: Secondary | ICD-10-CM

## 2012-11-22 MED ORDER — OXYCODONE-ACETAMINOPHEN 10-325 MG PO TABS: 1.0000 | ORAL_TABLET | ORAL | Status: DC | PRN

## 2012-11-22 NOTE — Progress Notes (Signed)
Hyperpigmentation of anterior neck noted without desquamation. Reports using radiaplex as directed. Denies pain at this time. Reports instilling 1-2 cans of tube feeding per day. Reports eating and drinking without difficulty. Patient reports that he eats by mouth mostly soft foods. Denies nausea, vomiting, headache, or dizziness. Weight gain noted.

## 2012-11-22 NOTE — Progress Notes (Signed)
   Weekly Management Note:  outpatient Current Dose:  34 Gy  Projected Dose: 60 Gy   Narrative:  The patient presents for routine under treatment assessment.  CBCT/MVCT images/Port film x-rays were reviewed.  The chart was checked. Doing well, taking oxycodone prn pain in tab form (susp too costly)  Physical Findings:  weight is 151 lb 12.8 oz (68.856 kg). His oral temperature is 98.2 F (36.8 C). His blood pressure is 100/66 and his pulse is 80. His respiration is 18 and oxygen saturation is 100%.  minimal mucositis - small ulcer on top of anterior tongue. No thrush  Impression:  The patient is tolerating radiotherapy.   Plan:  Continue radiotherapy as planned. Refilled percocet #40 today.  Recommended baking soda/ salt rinse: swish Tama Gander prn.  -----------------------------------  Lonie Peak, MD

## 2012-11-23 ENCOUNTER — Ambulatory Visit
Admission: RE | Admit: 2012-11-23 | Discharge: 2012-11-23 | Disposition: A | Discharge status: Home / Self Care | Payer: Managed Care, Other (non HMO) | Source: Ambulatory Visit | Attending: Radiation Oncology | Admitting: Radiation Oncology

## 2012-11-24 ENCOUNTER — Ambulatory Visit
Admission: RE | Admit: 2012-11-24 | Discharge: 2012-11-24 | Disposition: A | Discharge status: Home / Self Care | Payer: Managed Care, Other (non HMO) | Source: Ambulatory Visit | Attending: Radiation Oncology | Admitting: Radiation Oncology

## 2012-11-25 ENCOUNTER — Ambulatory Visit
Admission: RE | Admit: 2012-11-25 | Discharge: 2012-11-25 | Disposition: A | Discharge status: Home / Self Care | Payer: Managed Care, Other (non HMO) | Source: Ambulatory Visit | Attending: Radiation Oncology | Admitting: Radiation Oncology

## 2012-11-26 ENCOUNTER — Ambulatory Visit: Payer: Managed Care, Other (non HMO)

## 2012-11-29 ENCOUNTER — Ambulatory Visit: Payer: Managed Care, Other (non HMO)

## 2012-11-30 ENCOUNTER — Ambulatory Visit
Admission: RE | Admit: 2012-11-30 | Discharge: 2012-11-30 | Disposition: A | Discharge status: Home / Self Care | Payer: Managed Care, Other (non HMO) | Source: Ambulatory Visit | Attending: Radiation Oncology | Admitting: Radiation Oncology

## 2012-11-30 ENCOUNTER — Ambulatory Visit
Admission: RE | Admit: 2012-11-30 | Payer: Managed Care, Other (non HMO) | Source: Ambulatory Visit | Admitting: Radiation Oncology

## 2012-11-30 VITALS — BP 112/70 | HR 80 | Temp 98.6°F | Ht 65.0 in | Wt 153.5 lb

## 2012-11-30 DIAGNOSIS — C023 Malignant neoplasm of anterior two-thirds of tongue, part unspecified: Secondary | ICD-10-CM

## 2012-11-30 NOTE — Progress Notes (Signed)
   Weekly Management Note: Outpatient Current Dose:  42 Gy  Projected Dose: 60 Gy   Narrative:  The patient presents for routine under treatment assessment.  CBCT/MVCT images/Port film x-rays were reviewed.  The chart was checked.  Recently missed a couple treatments but I called him yesterday and he was willing to come today. He understands importance of minimizing missed treatments for maximal efficacy. Doing better after several days of nausea related to ?virus and lack of air conditioning at home.  Getting AC fixed, virus resolved.  Minimal oral pain.    Physical Findings:  height is 5\' 5"  (1.651 m) and weight is 153 lb 8 oz (69.627 kg). His temperature is 98.6 F (37 C). His blood pressure is 112/70 and his pulse is 80. His oxygen saturation is 99%.  ulcer on anterior dorsal tongue. No thrush. Diffuse oral erythema.  Impression:  The patient is tolerating radiotherapy.  Plan:  Continue radiotherapy as planned.   ________________________________   Lonie Peak, M.D.

## 2012-11-30 NOTE — Progress Notes (Signed)
Trevor Griffin here for weekly under treat visit.  He has had 21 fractions to his right tongue/bilateral neck.  He denies pain.  He is currently taking in 2 cans of ensure through his tube.  He is also eating and drinking by mouth.  The skin on his neck is red and peeling.  He is using radiaplex gel once a day.  His mouth is dry and his saliva is thick.  He is fatigued.  He occasionally has a hum in his ears.  He denies a cough.  He reports numbness in his tongue and jaw.  He does have a sore on the right side of his tongue.  He reports that he had a virus that started on Thursday that made him nauseated.

## 2012-12-01 ENCOUNTER — Ambulatory Visit
Admission: RE | Admit: 2012-12-01 | Discharge: 2012-12-01 | Disposition: A | Discharge status: Home / Self Care | Payer: Managed Care, Other (non HMO) | Source: Ambulatory Visit | Attending: Radiation Oncology | Admitting: Radiation Oncology

## 2012-12-02 ENCOUNTER — Ambulatory Visit
Admission: RE | Admit: 2012-12-02 | Discharge: 2012-12-02 | Disposition: A | Discharge status: Home / Self Care | Payer: Managed Care, Other (non HMO) | Source: Ambulatory Visit | Attending: Radiation Oncology | Admitting: Radiation Oncology

## 2012-12-03 ENCOUNTER — Ambulatory Visit
Admission: RE | Admit: 2012-12-03 | Discharge: 2012-12-03 | Disposition: A | Discharge status: Home / Self Care | Payer: Managed Care, Other (non HMO) | Source: Ambulatory Visit | Attending: Radiation Oncology | Admitting: Radiation Oncology

## 2012-12-06 ENCOUNTER — Encounter: Payer: Self-pay | Admitting: Radiation Oncology

## 2012-12-06 ENCOUNTER — Ambulatory Visit: Payer: Managed Care, Other (non HMO)

## 2012-12-06 ENCOUNTER — Ambulatory Visit
Admission: RE | Admit: 2012-12-06 | Discharge: 2012-12-06 | Disposition: A | Discharge status: Home / Self Care | Payer: Managed Care, Other (non HMO) | Source: Ambulatory Visit | Attending: Radiation Oncology | Admitting: Radiation Oncology

## 2012-12-06 VITALS — BP 108/74 | HR 93 | Temp 97.8°F | Resp 20 | Wt 150.8 lb

## 2012-12-06 DIAGNOSIS — C023 Malignant neoplasm of anterior two-thirds of tongue, part unspecified: Secondary | ICD-10-CM

## 2012-12-06 NOTE — Progress Notes (Signed)
   Weekly Management Note:  outpatient Current Dose:  52 Gy  Projected Dose: 60 Gy   Narrative:  The patient presents for routine under treatment assessment.  CBCT/MVCT images/Port film x-rays were reviewed.  The chart was checked. Doing well.  Pain tolerable. He has MMW from Dr. Erroll Luna at home which he hasn't tried yet.  Not using Biafine very often.  Would like to return to work, light duty, next week.  Physical Findings:  weight is 150 lb 12.8 oz (68.402 kg). His oral temperature is 97.8 F (36.6 C). His blood pressure is 108/74 and his pulse is 93. His respiration is 20.  Relatively stable exam. Oral cavity notable for erythema, stable ulcer on anterior tongue. No thrush. Skin is quite dry, peeling, hyperpigmented.  Impression:  The patient is tolerating radiotherapy.   Plan:  Continue radiotherapy as planned. MMW prn, with percocet.  Biafine BID. Will order f/u in 3 wks.  -----------------------------------  Lonie Peak, MD

## 2012-12-06 NOTE — Progress Notes (Signed)
Weekly rad txs, 25/30   Rt tongue, completed, right neck dry desquamation, erthema, uses biafine at times, no taste , had mac-n-cheese today. Rice andgator ade, mashed potatoes, today for lunch, breakfast 1 can ensure, no difficulty swallowing , soft foods, drinks water at bedside at night, thick saliva in mornings, some fatigue stated Loss 3 lbs 1 week, encouraged to use baifine bid 4:25 PM

## 2012-12-07 ENCOUNTER — Ambulatory Visit
Admission: RE | Admit: 2012-12-07 | Discharge: 2012-12-07 | Disposition: A | Discharge status: Home / Self Care | Payer: Managed Care, Other (non HMO) | Source: Ambulatory Visit | Attending: Radiation Oncology | Admitting: Radiation Oncology

## 2012-12-08 ENCOUNTER — Ambulatory Visit: Payer: Managed Care, Other (non HMO)

## 2012-12-08 ENCOUNTER — Ambulatory Visit
Admission: RE | Admit: 2012-12-08 | Discharge: 2012-12-08 | Disposition: A | Discharge status: Home / Self Care | Payer: Managed Care, Other (non HMO) | Source: Ambulatory Visit | Attending: Radiation Oncology | Admitting: Radiation Oncology

## 2012-12-09 ENCOUNTER — Ambulatory Visit
Admission: RE | Admit: 2012-12-09 | Discharge: 2012-12-09 | Disposition: A | Discharge status: Home / Self Care | Payer: Managed Care, Other (non HMO) | Source: Ambulatory Visit | Attending: Radiation Oncology | Admitting: Radiation Oncology

## 2012-12-10 ENCOUNTER — Encounter: Payer: Self-pay | Admitting: Radiation Oncology

## 2012-12-10 ENCOUNTER — Ambulatory Visit
Admission: RE | Admit: 2012-12-10 | Discharge: 2012-12-10 | Disposition: A | Discharge status: Home / Self Care | Payer: Managed Care, Other (non HMO) | Source: Ambulatory Visit | Attending: Radiation Oncology | Admitting: Radiation Oncology

## 2012-12-10 VITALS — BP 117/76 | HR 84 | Temp 97.4°F | Ht 65.0 in | Wt 151.1 lb

## 2012-12-10 DIAGNOSIS — C023 Malignant neoplasm of anterior two-thirds of tongue, part unspecified: Secondary | ICD-10-CM

## 2012-12-10 NOTE — Progress Notes (Signed)
Trevor Griffin here with his wife for final treatment visit.  He has had 30 fractions to his right tongue/bilateranl neck.  He rates his pain at a 5 in his mouth.  On inspection, he has a small sore on the front on his tongue and a white ulcer on his upper left inside lip.  He reports a dry mouth especially with sleep.  He says the saliva pools in his throat.  He does have fatigue.  The skin on his neck is red with a peeling area on the front of his neck with some scabbed areas.  He is using biafine cream twice a day.  He is taking in one can of ensure through his peg tube and taking all his other nutrition by mouth.  His weight is steady today at 151 lbs.  He denies hearing loss.  He is doing his jaw exercises daily.

## 2012-12-10 NOTE — Progress Notes (Signed)
   Weekly Management Note  Outpatient  Completed Radiotherapy. Total Dose:  60 Gy   Narrative:  The patient presents for routine under treatment assessment on last day of radiotherapy.  CBCT/MVCT images/Port film x-rays were reviewed.  The chart was checked. Symptoms relatively stable, but he is realizing it may not be realistic to return to work next week.  Physical Findings:  height is 5\' 5"  (1.651 m) and weight is 151 lb 1.6 oz (68.539 kg). His temperature is 97.4 F (36.3 C). His blood pressure is 117/76 and his pulse is 84. His oxygen saturation is 100%.   Exam stable  Impression:  The patient has tolerated radiotherapy.  Plan:  Routine follow-up in early Sept. ________________________________   Lonie Peak, M.D.

## 2012-12-12 NOTE — Progress Notes (Signed)
  Radiation Oncology         [phone] ________________________________  Name: Trevor Griffin MRN: [id number]  Date: 12/10/2012  DOB: Jul 31, 1955  End of Treatment Note  Diagnosis:  Pathologic T3 N1 squamous cell carcinoma; right oral tongue; grade 2  Indication for treatment:  Postoperative, curative       Radiation treatment dates:  10/25/2012-12/10/2012  Site/dose:   Right Tongue/ Bilateral Neck /// Total Dose, 60 Gy in 30 Fractions  Beams/energy:   IMRT Helical / 6 MV photons  Narrative: The patient tolerated radiation treatment relatively well.   He missed a couple treatments during the second half of his course due to a stomach virus and issues with his home air conditioning. Otherwise, attendance was relatively good. No concurrent chemotherapy. Pain alleviated with Percocet and MMW PRN. [facility] psychotherapist and navigator worked extensively with the patient and family in light of multiple social stressors.  [name] continues to drink ETOH, heavily at times, without motivation to stop, in spite of being offered counseling.  Plan: The patient has completed radiation treatment. The patient will return to radiation oncology clinic for routine followup in one month. I advised them to call or return sooner if they have any questions or concerns related to their recovery or treatment.  -----------------------------------  Lonie Peak, MD

## 2012-12-22 ENCOUNTER — Telehealth: Payer: Self-pay | Admitting: *Deleted

## 2012-12-22 NOTE — Telephone Encounter (Signed)
Made call to pt 1 week s/p completion of RT.  Pt indicated he is doing well overall.  He spoke at length about difficulties he is having filing for [employer] Disability; requested FU appt with Dr. Basilio Cairo for documentation support.  Indicated, also, he needs a refill for Percocet Rx.  I relayed requests to Dr. Basilio Cairo via [facility] [place].  Young Berry, RN, BSN, [facility] Head & Neck Oncology Navigator [phone]

## 2012-12-22 NOTE — Progress Notes (Signed)
  Radiation Oncology         [phone] ________________________________  Name: Trevor Griffin MRN: [id number]  Date: 10/13/2012  DOB: 07-Jun-1955  SIMULATION AND TREATMENT PLANNING NOTE  Outpatient  DIAGNOSIS:  Tongue Cancer  NARRATIVE:  The patient was brought to the CT Simulation planning suite.  Identity was confirmed.  All relevant records and images related to the planned course of therapy were reviewed.  The patient freely provided informed written consent to proceed with treatment after reviewing the details related to the planned course of therapy. The consent form was witnessed and verified by the simulation staff.    Then, the patient was set-up in a stable reproducible  supine position for radiation therapy.  Aquaplast mask was made.  Tongue positioner/bite block was not used as this could not be reliably secured in a reproducible position (patient lacks dentition.) CT images were obtained.  Surface markings were placed.  The CT images were loaded into the planning software.    TREATMENT PLANNING NOTE: Treatment planning then occurred.  The radiation prescription was entered and confirmed.    A total of 1 medically necessary complex treatment devices were fabricated and supervised by me (mask) . I have requested : Intensity Modulated Radiotherapy (IMRT) is medically necessary for this case for the following reason:  Parotid/cord/esophageal sparing.  The patient will receive 60 Gy in 30 fractions.   -----------------------------------  Lonie Peak, MD

## 2012-12-23 ENCOUNTER — Telehealth: Payer: Self-pay | Admitting: *Deleted

## 2012-12-23 NOTE — Telephone Encounter (Signed)
LVM indicating pt can pick up Rx tomorrow at [facility] when he comes to [facility].  Also requested tjhat he bring necessary forms for Dr. Basilio Cairo r/t his ST disability.  Young Berry, RN, BSN, [facility] Head & Neck Oncology Navigator [phone]

## 2012-12-27 ENCOUNTER — Telehealth: Payer: Self-pay | Admitting: *Deleted

## 2012-12-27 NOTE — Telephone Encounter (Signed)
Received call from pt wife asking about "cancer form" previously provided by CSW.  I provided her the telephone number for [facility] per Kathrin Penner' direction.  She asked if there were long term disability forms ready for her husband to pick up.  I indicated that we don't have those forms and that if there is documentation needed from Dr. Basilio Cairo, the pt needs to from the forms.  She indicated understanding.  Young Berry, RN, BSN, [facility] Head & Neck Oncology Navigator [phone]

## 2013-01-06 ENCOUNTER — Encounter: Payer: Self-pay | Admitting: Radiation Oncology

## 2013-01-07 ENCOUNTER — Ambulatory Visit
Admission: RE | Admit: 2013-01-07 | Payer: Managed Care, Other (non HMO) | Source: Ambulatory Visit | Admitting: Radiation Oncology

## 2013-01-07 HISTORY — DX: Personal history of irradiation: Z92.3

## 2013-01-19 ENCOUNTER — Encounter: Payer: Self-pay | Admitting: *Deleted

## 2013-01-19 NOTE — Progress Notes (Signed)
Called pt as routine f/u.   Pt not at home but spoke with his wife.  She indicated pt needed refill for "pain med" and that they wd talk to Dr. Basilio Cairo about that when they see her on Friday at 4:00pm appt.  Continuing to navigate as L3 patient.  Young Berry, RN, BSN, [facility] Head & Neck Oncology Navigator [phone]

## 2013-01-20 ENCOUNTER — Encounter: Payer: Self-pay | Admitting: Radiation Oncology

## 2013-01-21 ENCOUNTER — Encounter: Payer: Self-pay | Admitting: *Deleted

## 2013-01-21 ENCOUNTER — Encounter: Payer: Self-pay | Admitting: Radiation Oncology

## 2013-01-21 ENCOUNTER — Ambulatory Visit
Admission: RE | Admit: 2013-01-21 | Discharge: 2013-01-21 | Disposition: A | Discharge status: Home / Self Care | Payer: Managed Care, Other (non HMO) | Source: Ambulatory Visit | Attending: Radiation Oncology | Admitting: Radiation Oncology

## 2013-01-21 VITALS — BP 126/83 | HR 84 | Temp 97.5°F | Wt 153.7 lb

## 2013-01-21 DIAGNOSIS — C029 Malignant neoplasm of tongue, unspecified: Secondary | ICD-10-CM

## 2013-01-21 MED ORDER — OXYCODONE-ACETAMINOPHEN 10-325 MG PO TABS: 1.0000 | ORAL_TABLET | Freq: Two times a day (BID) | ORAL | Status: DC | PRN

## 2013-01-21 MED ORDER — FLUCONAZOLE 100 MG PO TABS: ORAL_TABLET | ORAL | Status: DC

## 2013-01-21 NOTE — Progress Notes (Signed)
Radiation Oncology         (336) [phone] ________________________________  Name: Trevor Griffin MRN: [id number]  Date: 01/21/2013  DOB: 01-29-1956  Follow-Up Visit Note  CC: Sanda Linger, MD  Etta Grandchild, MD  Diagnosis and Prior Radiotherapy:   Pathologic T3 N1 squamous cell carcinoma; right oral tongue; grade 2  Indication for treatment: Postoperative, curative  Radiation treatment dates: 10/25/2012-12/10/2012  Site/dose: Right Tongue/ Bilateral Neck /// Total Dose, 60 Gy in 30 Fractions  Narrative:  The patient returns today for routine follow-up.  He still c/o soreness of mouth. Still experiences burning of mouth with acid foods or juices. He has discomfort if he wears his dentures. He is able to eat soft foods such as eggs, mashed potatoes, canned fruit, and pasta with cream sauce. Not using feeding tube. Has gained 2.6 lbs since 12/10/12               Resumed smoking. Still drinking beer.  Sees Dr. Erroll Luna on Oct 13.  He is desiring application for long term disability.  Feels he lacks the stamina, strength, range of motion in neck/arms to do his job.  Also burdened by other comorbidities.                ALLERGIES:  is allergic to nubain and hydromorphone.  Meds: Current Outpatient Prescriptions  Medication Sig Dispense Refill  . atorvastatin (LIPITOR) 80 MG tablet Take 80 mg by mouth daily.        . bacitracin 500 UNIT/GM ointment Apply 1 application topically 2 (two) times daily.      . Blood Pressure Monitoring (RA BLOOD PRESSURE CUFF MONITOR) MISC 1 Device. 1 Device by Misc.(Non-Drug; Combo Route) route as needed.      Marland Kitchen buPROPion (WELLBUTRIN SR) 150 MG 12 hr tablet Take 150 mg by mouth 2 (two) times daily.      Marland Kitchen LORazepam (ATIVAN) 2 MG tablet Take 1 tablet (2 mg total) by mouth every 8 (eight) hours as needed for anxiety.  45 tablet  0  . Multiple Vitamins-Minerals (MULTIVITAMIN WITH MINERALS) tablet Take 1 tablet by mouth daily.        . nitroGLYCERIN (NITROSTAT) 0.4 MG SL  tablet Place 1 tablet (0.4 mg total) under the tongue every 5 (five) minutes as needed (up to 3 doses). For chest pain      . oxyCODONE-acetaminophen (PERCOCET) 10-325 MG per tablet Take 1 tablet by mouth every 4 (four) hours as needed for pain.  40 tablet  0  . polyethylene glycol (MIRALAX / GLYCOLAX) packet Take 17 g by mouth daily as needed (constipation).      . traMADol (ULTRAM) 50 MG tablet Take by mouth. Take 50 mg by mouth every 6 (six) hours as needed for pain.      Marland Kitchen aspirin 325 MG tablet Take 325 mg by mouth daily.      . bisoprolol (ZEBETA) 10 MG tablet Take 15 mg by mouth daily. Takes 1.5 tablet      . lisinopril (PRINIVIL,ZESTRIL) 10 MG tablet Take 10 mg by mouth daily.      Marland Kitchen spironolactone (ALDACTONE) 25 MG tablet Take 12.5 mg by mouth daily.       . traZODone (DESYREL) 50 MG tablet Take 1 tablet (50 mg total) by mouth at bedtime.  90 tablet  3   No current facility-administered medications for this encounter.    Physical Findings: The patient is in no acute distress. Patient is alert and oriented.  weight is 153 lb 11.2 oz (69.718 kg). His temperature is 97.5 F (36.4 C). His blood pressure is 126/83 and his pulse is 84. .   General: Alert and oriented, in no acute distress HEENT: small amount of thrush on mucosa.  No concerning lesions suggestive of cancer.  Mucositis has largely healed.   Neck: anterior subcutaneous edema.   no palpable cervical or supraclavicular lymphadenopathy.   Lab Findings: Lab Results  Component Value Date   WBC 7.8 09/28/2012   HGB 10.9* 09/28/2012   HCT 32.0* 09/28/2012   MCV 97.6 09/28/2012   PLT 464* 09/28/2012    Radiographic Findings: No results found.  Impression/Plan:    1) Head and Neck Cancer Status: healing from RT  2) Nutritional Status: - weight: rising - PEG tube:not using, will discuss removal with [facility]  3) Risk Factors: The patient has been educated about risk factors including alcohol and tobacco abuse;  they understand that avoidance of alcohol and tobacco is important to prevent recurrences as well as other cancers.  Talked about strategies to cut back down on tobacco use.  Not motivated to stop using beer.    4) Swallowing: Ok with soft foods  5) Dental:Will refer to Dr. Kristin Bruins for new dentures. Advised not to wear old dentures which are ill fitting  6) Energy: improving slowly. Check TSH in ~10mo  7) Social: seeking long term disability - Will refer to Social work to help with logistics of this  8) Other: PT for neck edema, ROM in arm/neck. Fluconazole for thrush. Percocet (using less, maybe one pill BID) refilled today.  9) Follow-up in end of Oct with CT of neck/chest at that time. The patient was encouraged to call with any issues or questions before then.   I spent 25 minutes minutes face to face with the patient and more than 50% of that time was spent in counseling and/or coordination of care. _____________________________________   Lonie Peak, MD

## 2013-01-21 NOTE — Progress Notes (Signed)
Met with patient during schedule appt with Dr. Basilio Cairo.  Pt stated he has no energy and still tries to "get in some walks".  Indicated he is is sleeping about 10 hrs a night.  Will continue to navigate as Level 3 patient (treatments completed).  Young Berry, RN, BSN, [facility] Head & Neck Oncology Navigator [phone]

## 2013-01-21 NOTE — Progress Notes (Addendum)
Trevor Griffin here for assessment s/p radiation to the right oral tongue which completed on 12/10/12.  He still c/o soreness of mouth and note a whitish area on the left lower gum line.  Still experiences burning of mouth with acid foods or juices.  He has discomfort if he wears his dentures.  He is able to eat soft foods such as eggs, mashed potatoes, canned fruit, and pasta with cream sauce.  Not using feeding tube.  Has gained 2.6 lbs since 12/10/12

## 2013-01-24 ENCOUNTER — Telehealth: Payer: Self-pay | Admitting: *Deleted

## 2013-01-24 NOTE — Telephone Encounter (Signed)
Called patient to inform of tests, and PT appts., spoke with patient and he is aware of these appts.

## 2013-01-31 ENCOUNTER — Telehealth: Payer: Self-pay | Admitting: *Deleted

## 2013-01-31 NOTE — Telephone Encounter (Addendum)
Spoke with patient re: Dr. Luretha Murphy recommendation.  Pt indicated that he has a f/u appt with Dr. Manson Passey next week and he will ask him to make the referral if he is unable to get assistance at [facility].  Young Berry, RN, BSN, [facility] Head & Neck Oncology Navigator [phone]

## 2013-02-04 ENCOUNTER — Ambulatory Visit: Payer: Medicaid Other | Attending: Radiation Oncology | Admitting: Physical Therapy

## 2013-02-25 ENCOUNTER — Encounter: Payer: Self-pay | Admitting: Radiation Oncology

## 2013-02-25 NOTE — Progress Notes (Signed)
Spoke with patient today.  He stated that he Monia Pouch has termed and he has no other coverage.  He is currently trying to get Medicare and Medicaid.

## 2013-03-03 ENCOUNTER — Ambulatory Visit (HOSPITAL_COMMUNITY)
Admission: RE | Admit: 2013-03-03 | Discharge: 2013-03-03 | Disposition: A | Discharge status: Home / Self Care | Payer: Medicaid Other | Source: Ambulatory Visit | Attending: Radiation Oncology | Admitting: Radiation Oncology

## 2013-03-03 DIAGNOSIS — R911 Solitary pulmonary nodule: Secondary | ICD-10-CM | POA: Insufficient documentation

## 2013-03-03 DIAGNOSIS — M47812 Spondylosis without myelopathy or radiculopathy, cervical region: Secondary | ICD-10-CM | POA: Insufficient documentation

## 2013-03-03 DIAGNOSIS — C029 Malignant neoplasm of tongue, unspecified: Secondary | ICD-10-CM | POA: Insufficient documentation

## 2013-03-03 DIAGNOSIS — Z923 Personal history of irradiation: Secondary | ICD-10-CM | POA: Insufficient documentation

## 2013-03-03 DIAGNOSIS — J320 Chronic maxillary sinusitis: Secondary | ICD-10-CM | POA: Insufficient documentation

## 2013-03-03 DIAGNOSIS — I7090 Unspecified atherosclerosis: Secondary | ICD-10-CM | POA: Insufficient documentation

## 2013-03-03 DIAGNOSIS — K8020 Calculus of gallbladder without cholecystitis without obstruction: Secondary | ICD-10-CM | POA: Insufficient documentation

## 2013-03-03 MED ORDER — IOHEXOL 300 MG/ML  SOLN
100.0000 mL | Freq: Once | INTRAMUSCULAR | Status: AC | PRN
  Administered 2013-03-03: 100 mL via INTRAVENOUS

## 2013-03-04 ENCOUNTER — Telehealth: Payer: Self-pay | Admitting: *Deleted

## 2013-03-04 NOTE — Telephone Encounter (Signed)
CALLED PATIENT TO ASK ABOUT COMING IN FOR A FU VISIT ON 03-04-13 AT 3:40 PM, SPOKE WITH PATIENT AND THEY DECLINED COMING TODAY, AND WILL BE HERE ON 03-08-13 AT 8:40 AM , PATIENT WAS GOOD WITH THIS TIME AND DATE.

## 2013-03-08 ENCOUNTER — Ambulatory Visit
Admission: RE | Admit: 2013-03-08 | Discharge: 2013-03-08 | Disposition: A | Discharge status: Home / Self Care | Payer: Self-pay | Source: Ambulatory Visit | Attending: Radiation Oncology | Admitting: Radiation Oncology

## 2013-03-08 ENCOUNTER — Encounter: Payer: Self-pay | Admitting: Radiation Oncology

## 2013-03-08 ENCOUNTER — Telehealth: Payer: Self-pay | Admitting: *Deleted

## 2013-03-08 ENCOUNTER — Encounter: Payer: Self-pay | Admitting: *Deleted

## 2013-03-08 DIAGNOSIS — C023 Malignant neoplasm of anterior two-thirds of tongue, part unspecified: Secondary | ICD-10-CM

## 2013-03-08 NOTE — Progress Notes (Signed)
Follow up h/n total dose 60Gy/30 fx Ct neck/chest 03/03/13 results peg removed 1 month ago, soft foods still fatigued, no c/o pain , no dry mouth does use biotene rinses 8:59 AM  .. Marland Kitchen..

## 2013-03-08 NOTE — Progress Notes (Signed)
Radiation Oncology         [phone] ________________________________  Name: Trevor Griffin MRN: [id number]  Date: 03/08/2013  DOB: 1955/09/12  Follow-Up Visit Note  CC: Sanda Linger, MD  [name], MD  Diagnosis and Prior Radiotherapy:   Pathologic T3 N1 squamous cell carcinoma; right oral tongue; grade 2  Indication for treatment: Postoperative, curative  Radiation treatment dates: 10/25/2012-12/10/2012  Site/dose: Right Tongue/ Bilateral Neck /// Total Dose, 60 Gy in 30 Fractions   Narrative:  The patient returns today for routine follow-up.  No dry mouth. No pain. Still tired.  Applying for permanent disability. Smoking and drinking ETOH. Missed appt with Dr Erroll Luna due to having the flu.  Using old dentures. Has trismus.     Still caring for adopted son who has special needs.  CT results show Local regional control but growing RLL lung nodule. We discussed this today and I showed him and his wife the images.                     ALLERGIES:  is allergic to nubain and hydromorphone.  Meds: Current Outpatient Prescriptions  Medication Sig Dispense Refill  . aspirin 325 MG tablet Take 325 mg by mouth daily.      Marland Kitchen atorvastatin (LIPITOR) 80 MG tablet Take 80 mg by mouth daily.        . bisoprolol (ZEBETA) 10 MG tablet Take 15 mg by mouth daily. Takes 1.5 tablet      . Blood Pressure Monitoring (RA BLOOD PRESSURE CUFF MONITOR) MISC 1 Device. 1 Device by Misc.(Non-Drug; Combo Route) route as needed.      Marland Kitchen buPROPion (WELLBUTRIN SR) 150 MG 12 hr tablet Take 150 mg by mouth 2 (two) times daily.      Marland Kitchen LORazepam (ATIVAN) 2 MG tablet Take 1 tablet (2 mg total) by mouth every 8 (eight) hours as needed for anxiety.  45 tablet  0  . Multiple Vitamins-Minerals (MULTIVITAMIN WITH MINERALS) tablet Take 1 tablet by mouth daily.        Marland Kitchen spironolactone (ALDACTONE) 25 MG tablet Take 12.5 mg by mouth daily.       . traMADol (ULTRAM) 50 MG tablet Take by mouth. Take 50 mg by mouth every 6 (six)  hours as needed for pain.      . traZODone (DESYREL) 50 MG tablet Take 1 tablet (50 mg total) by mouth at bedtime.  90 tablet  3  . bacitracin 500 UNIT/GM ointment Apply 1 application topically 2 (two) times daily.      Marland Kitchen lisinopril (PRINIVIL,ZESTRIL) 10 MG tablet Take 10 mg by mouth daily.      . nitroGLYCERIN (NITROSTAT) 0.4 MG SL tablet Place 1 tablet (0.4 mg total) under the tongue every 5 (five) minutes as needed (up to 3 doses). For chest pain      . oxyCODONE-acetaminophen (PERCOCET) 10-325 MG per tablet Take 1 tablet by mouth 2 (two) times daily as needed for pain.  60 tablet  0  . polyethylene glycol (MIRALAX / GLYCOLAX) packet Take 17 g by mouth daily as needed (constipation).       No current facility-administered medications for this encounter.    Physical Findings: The patient is in no acute distress. Patient is alert and oriented 152.8 lb, 135/82, 97.2 F, 99 pulse, 20 resp, 99% RA No evidence of tumor in oral cavity, oropharynx. Mucous membranes moist. +trismus.  Neck - no adenopathy.          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  Lab Findings: Lab Results  Component Value Date   WBC 7.8 09/28/2012   HGB 10.9* 09/28/2012   HCT 32.0* 09/28/2012   MCV 97.6 09/28/2012   PLT 464* 09/28/2012    Radiographic Findings: Ct Soft Tissue Neck W Contrast  03/03/2013   CLINICAL DATA:  Re-Staging. Surgery for squamous cell carcinoma of the right oral tongue.  EXAM: CT NECK WITH CONTRAST  TECHNIQUE: Multidetector CT imaging of the neck was performed using the standard protocol following the bolus administration of intravenous contrast.  CONTRAST:  OMNIPAQUE IOHEXOL 300 MG/ML  SOLN  COMPARISON:  None. Previous examinations were done at [facility]  FINDINGS: The patient is status post right hemiglossectomy. No recurrent tumor is seen. No visible pathologic adenopathy. There is stranding of the superficial and deep adipose tissue related to post XRT changes. Hyperenhancing  left submandibular gland consistent with XRT change. Right SMG appears absent.  No masticator muscle denervation changes are seen to suggest sequelae of perineural tumor spread. No retropharyngeal lymph nodes are observed.  Multiple surgical clips reflect bilateral selective neck dissection. There is a healed midline tracheostomy. Extensive atherosclerosis. No visible intracranial lesions. Chronic left maxillary sinusitis. Minor calcification and cystic change in right lobe thyroid. Spondylosis without worrisome osseous lesions.  IMPRESSION: Postsurgical changes as described status post right hemiglossectomy, bilateral selective neck dissection, tracheostomy, and free flap reconstruction. Unremarkable and expected post XRT changes. No local recurrence is evident.  Please see chest CT dictation reported separately for additional important findings.   Electronically Signed   By: Davonna Belling M.D.   On: 03/03/2013 12:47   Ct Chest W Contrast  03/03/2013   CLINICAL DATA:  Neck stiffness and dysphasia  EXAM: CT CHEST WITH CONTRAST  TECHNIQUE: Multidetector CT imaging of the chest was performed during intravenous contrast administration.  CONTRAST:  OMNIPAQUE IOHEXOL 300 MG/ML  SOLN  COMPARISON:  10/15/2012  FINDINGS: No pleural effusion identified. Right upper lobe pulmonary nodule measures 3 mm, image 27/ series 5. Nodule within the right lower lobe appears increased from previous exam now measuring 1.3 cm. This now has a cavitary component, image 50/series 5. On the previous exam this measured 4 mm. The heart size appears normal. No pericardial effusions identified. No mediastinal or hilar lymph nodes identified.  Left hilar lymph node measures 7 mm and is unchanged from previous exam. No enlarged axillary lymph nodes. No supraclavicular adenopathy identified. Limited imaging through the upper abdomen shows stone within the gallbladder measuring 8 mm. No acute upper abdominal CT findings noted.  Review of the  visualized bony structures demonstrates no aggressive lytic or sclerotic bone lesions.  IMPRESSION: 1. There is an enlarging (and now cavitary) nodule within the right lower lobe. Suspicious for squamous cell carcinoma metastasis. Consider further evaluation with PET-CT.   Electronically Signed   By: Signa Kell M.D.   On: 03/03/2013 11:49    Impression/Plan:    1) Head and Neck Cancer Status: questionable RLL lung nodule  2) Nutritional Status:  - weight: rising  - PEG tube: removed   3) Risk Factors: The patient has been educated about risk factors including alcohol and tobacco abuse; they understand that avoidance of alcohol and tobacco is important to prevent recurrences as well as other cancers. Still smoking and not motivated to stop using beer.   4) Swallowing: Ok with soft foods but not bread  5) Dental: still using old dentures. Reports he cannot afford new dentures  due to insurance issues. Suggested he ask dentist about this at [facility] to be sure. Ill fitting dentures are inadvisable, as we discussed.  6) Energy: still has fatigue. Check TSH at next visit.  7) Social: seeking long term disability - referred to Social work to help with logistics of this  - working with Pensions consultant.  I wrote letter to help with this today  8) Other: PT referral for neck edema, trismus, decreased ROM right arm ; I referred him at last appt as well but it seems he did not see anyone yet.   9) Follow-up in Feb with me. Pt missed f/u with Dr Erroll Luna, will see him in ~1 wk. We will fed ex CD of CT images to him.  Present him tomorrow at tumor board re: lung nodule.  I will see if this is amenable to CT guided bx.  Young Berry will f/u with pt regarding his disposition and ask Dr. Michell Heinrich to make any necessary orders for workup if I am on maternity leave by then.   I spent 20 minutes minutes face to face with the patient and more than 50% of that time was spent in counseling and/or coordination of  care. _____________________________________   Lonie Peak, MD

## 2013-03-08 NOTE — Telephone Encounter (Signed)
CALLED PATIENT TO INFORM OF APPT. FOR PT, FOR 03-09-13 - ARRIVAL TIME - 10:00 AM AT [facility], SPOKE WITH PATIENT AND HE IS AWARE OF THIS APPT.

## 2013-03-09 ENCOUNTER — Telehealth: Payer: Self-pay | Admitting: *Deleted

## 2013-03-09 ENCOUNTER — Encounter: Payer: Self-pay | Admitting: Radiation Oncology

## 2013-03-09 ENCOUNTER — Other Ambulatory Visit: Payer: Self-pay | Admitting: Radiation Oncology

## 2013-03-09 ENCOUNTER — Ambulatory Visit: Payer: Self-pay | Admitting: Physical Therapy

## 2013-03-09 DIAGNOSIS — C023 Malignant neoplasm of anterior two-thirds of tongue, part unspecified: Secondary | ICD-10-CM

## 2013-03-09 NOTE — Telephone Encounter (Signed)
Called patient to inform of test for 03-10-13, lvm for a return call.

## 2013-03-09 NOTE — Progress Notes (Signed)
Presented patient at [facility] tumor board this AM. Radiology has concerns that bx of RLL nodule would carry significant risk of pneumothorax due to pt's COPD. Therefore, consensus was to order a PET scan, and have the pt placed on MTOC schedule for further discussion/management. I called pt and wife and told them about this plan.  They are agreeable to this.  -----------------------------------  Lonie Peak, MD

## 2013-03-09 NOTE — Telephone Encounter (Signed)
Spoke with Gunnar Fusi in [facility] to request transfer of pt's 03/03/13 CT imaging to Dr. Jacalyn Lefevre at [facility] via [facility] P[facility] per Dr. Colletta Maryland request.  Young Berry, RN, BSN, [facility] & Neck Oncology Navigator [phone]

## 2013-03-10 ENCOUNTER — Ambulatory Visit (HOSPITAL_COMMUNITY)
Admission: RE | Admit: 2013-03-10 | Discharge: 2013-03-10 | Disposition: A | Discharge status: Home / Self Care | Payer: Medicaid Other | Source: Ambulatory Visit | Attending: Radiation Oncology | Admitting: Radiation Oncology

## 2013-03-10 ENCOUNTER — Encounter (HOSPITAL_COMMUNITY): Payer: Self-pay

## 2013-03-10 DIAGNOSIS — C029 Malignant neoplasm of tongue, unspecified: Secondary | ICD-10-CM | POA: Insufficient documentation

## 2013-03-10 DIAGNOSIS — J320 Chronic maxillary sinusitis: Secondary | ICD-10-CM | POA: Insufficient documentation

## 2013-03-10 DIAGNOSIS — C023 Malignant neoplasm of anterior two-thirds of tongue, part unspecified: Secondary | ICD-10-CM

## 2013-03-10 DIAGNOSIS — R911 Solitary pulmonary nodule: Secondary | ICD-10-CM | POA: Insufficient documentation

## 2013-03-10 LAB — GLUCOSE, CAPILLARY: Glucose-Capillary: 88 mg/dL (ref 70–99)

## 2013-03-10 MED ORDER — FLUDEOXYGLUCOSE F - 18 (FDG) INJECTION
17.8000 | Freq: Once | INTRAVENOUS | Status: AC | PRN
  Administered 2013-03-10: 17.8 via INTRAVENOUS

## 2013-03-11 ENCOUNTER — Telehealth: Payer: Self-pay | Admitting: *Deleted

## 2013-03-11 ENCOUNTER — Encounter: Payer: Self-pay | Admitting: Radiation Oncology

## 2013-03-11 NOTE — Progress Notes (Signed)
I spoke with Trevor Griffin by phone re: his PET results. He will get a call from Beaulah Dinning re: MTOC appointment for 11/13.  He is satisfied with this plan.  -----------------------------------  Lonie Peak, MD

## 2013-03-11 NOTE — Telephone Encounter (Signed)
Spoke with pt regarding appt for mtoc 03/17/13 at 4:00 surgery and 4:30 Rad Onc.  He verbalized understanding of appt time and place.

## 2013-03-14 ENCOUNTER — Encounter: Payer: Self-pay | Admitting: *Deleted

## 2013-03-14 NOTE — Progress Notes (Signed)
Placed call to [facility] requesting [employer] gas card for pt.  Young Berry, RN, BSN, [facility] Neck Oncology Navigator [phone]

## 2013-03-14 NOTE — Progress Notes (Signed)
Met with pt and his wife during scheduled follow-up visit with Dr. Basilio Cairo to provide support and encouragement.  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  He is applying for FT disability at this time.  I indicated I would place call to [facility] and request gas card.   Pt to be presented at upcoming MTOC r/t identification of lung nodule on recent PET.  Will continue to navigate as L3 (treatments completed) patient.  Young Berry, RN, BSN, [facility] Head & Neck Oncology Navigator [phone]

## 2013-03-17 ENCOUNTER — Encounter: Payer: Self-pay | Admitting: Thoracic Surgery (Cardiothoracic Vascular Surgery)

## 2013-03-17 ENCOUNTER — Telehealth: Payer: Self-pay | Admitting: *Deleted

## 2013-03-17 ENCOUNTER — Encounter: Payer: Self-pay | Admitting: *Deleted

## 2013-03-17 ENCOUNTER — Ambulatory Visit: Payer: Self-pay | Admitting: Physical Therapy

## 2013-03-17 ENCOUNTER — Ambulatory Visit: Payer: Self-pay | Admitting: Radiation Oncology

## 2013-03-17 NOTE — Telephone Encounter (Signed)
Called and spoke with pt.  He stated he, his wife and son all have the flu.  I told him I will reschedule appt's.  He verbalized understanding

## 2013-03-17 NOTE — Progress Notes (Signed)
Pt called left vm message he is sick with the flu and will not be here for appt.  I will reschedule

## 2013-03-21 ENCOUNTER — Telehealth: Payer: Self-pay | Admitting: *Deleted

## 2013-03-21 NOTE — Telephone Encounter (Signed)
Pt was unable to come to mtoc last week due to being sick with fever.  I called to check on him.  He stated he is feeling better and free of fever.  I stated I would work on getting him an appt with [facility] and Dr. Mitzi Hansen for follow up

## 2013-03-22 ENCOUNTER — Telehealth: Payer: Self-pay | Admitting: *Deleted

## 2013-03-22 NOTE — Telephone Encounter (Signed)
Called pt regarding appt with Dr. Mitzi Hansen on Dec 1 at 12:15.  He verbalized understanding of appt time and place.  I stated I am waiting for Thoracic surgery office to call with appt.  He expressed he may not do surgery and I listened.

## 2013-03-23 ENCOUNTER — Encounter: Payer: Self-pay | Admitting: *Deleted

## 2013-03-23 NOTE — Progress Notes (Signed)
Called pt regarding appt with Dr. Dorris Fetch.  He verbalized understanding of appt 03/29/12 at 3:30 at [address].

## 2013-03-29 ENCOUNTER — Encounter: Payer: Self-pay | Admitting: Thoracic Surgery (Cardiothoracic Vascular Surgery)

## 2013-03-30 NOTE — Progress Notes (Signed)
Thoracic Location of Tumor / Histology: Pet scan 11/6/14metastatic disease RLL   Patient presented  months ago with symptoms of: Biopsies of  (if applicable) revealed: RLL nodule seen on PET Scan from 03/10/13  Tobacco/Marijuana/Snuff/ETOH use: still smoking, and drinking ETOH   Past/Anticipated interventions by cardiothoracic surgery, if any: no   Past/Anticipated interventions by medical oncology, if any: no  Signs/Symptoms  Weight changes, if any:   Respiratory complaints, if any:   Hemoptysis, if any: NONE  Pain issues, if any: NONE   SAFETY ISSUES:  Prior radiation?yes, right tongue/b/l neck//total dose 60 Gy/64fxs Dr.Squire completed 12/10/12  Pacemaker/ICD? no    Is the patient on methotrexate No  Current Complaints / other details: Reports that he is very tired since the end of radiation therapy to his right tongue and bilateral neck which completed on 12/10/12.  Denies any SOB

## 2013-04-04 ENCOUNTER — Encounter: Payer: Self-pay | Admitting: Radiation Oncology

## 2013-04-04 ENCOUNTER — Ambulatory Visit
Admission: RE | Admit: 2013-04-04 | Discharge: 2013-04-04 | Disposition: A | Discharge status: Home / Self Care | Payer: Medicaid Other | Source: Ambulatory Visit | Attending: Radiation Oncology | Admitting: Radiation Oncology

## 2013-04-04 ENCOUNTER — Encounter: Payer: Self-pay | Admitting: *Deleted

## 2013-04-04 VITALS — BP 105/68 | HR 82 | Temp 98.2°F | Ht 65.0 in | Wt 152.1 lb

## 2013-04-04 DIAGNOSIS — R911 Solitary pulmonary nodule: Secondary | ICD-10-CM | POA: Insufficient documentation

## 2013-04-04 DIAGNOSIS — Z923 Personal history of irradiation: Secondary | ICD-10-CM | POA: Insufficient documentation

## 2013-04-04 DIAGNOSIS — C770 Secondary and unspecified malignant neoplasm of lymph nodes of head, face and neck: Secondary | ICD-10-CM | POA: Insufficient documentation

## 2013-04-04 DIAGNOSIS — C029 Malignant neoplasm of tongue, unspecified: Secondary | ICD-10-CM | POA: Insufficient documentation

## 2013-04-04 NOTE — Progress Notes (Signed)
Met with patient and his wife today.  He stated he is currently working with an attorney to obtain disability.  He currently has no insurance.  He explained that he has spoken to to someone as DSS and was told he would be unable to receive Medicare and Medicaid until his disability was approved.  I gave him a [place] financial application to fill out and return.

## 2013-04-04 NOTE — Progress Notes (Signed)
Met with patient during scheduled appt with Dr. Mitzi Hansen to provide support and care continuity.  In follow-up to patient's indication that his approval for Medicaid and disability is still pending, and that since losing his job he has not been able to afford medications, facilitated meeting  with Milon Dikes for financial support.  Young Berry, RN, BSN, [facility] Head & Neck Oncology Navigator [phone]

## 2013-04-05 ENCOUNTER — Ambulatory Visit: Payer: Self-pay | Admitting: Physical Therapy

## 2013-04-05 ENCOUNTER — Telehealth: Payer: Self-pay | Admitting: *Deleted

## 2013-04-05 NOTE — Progress Notes (Addendum)
Radiation Oncology         ([phone]) [phone] ________________________________  Name: Trevor Griffin MRN: [id number]  Date: 04/04/2013  DOB: 24-May-1955  Follow-Up Visit Note  CC: Sanda Linger, MD  Lonie Peak, MD  Diagnosis:   Squamous cell carcinoma of the right oral tongue, T3, N1, M0  Interval Since Last Radiation:  3 1/2 months: Postoperative radiation treatment to 60 gray completed on 12/10/2012   Narrative:  The patient is seen today regarding a suspicious nodule within the right lung. The patient as noted above finished radiation treatment postoperatively several months ago for a locally advanced head and neck cancer. He has been recovering. Patient has been noted to have a suspicious nodule within the right lower lobe. A CT scan of the chest on 03/03/2013 showed that the right lower lobe nodule had increased in size now measuring 1.3 cm. This had a cavitary component. On the previous exam this measured 4 mm.  The consensus was to proceed with a PET scan and this was completed on 03/10/2013. The suspicious nodule did demonstrate malignant range activity with a maximum SUV of 3.4. It has enlarged to 1.8 cm. The patient is seen today given the suspicious finding.                             ALLERGIES:  is allergic to nubain and hydromorphone.  Meds: Current Outpatient Prescriptions  Medication Sig Dispense Refill  . aspirin 325 MG tablet Take 325 mg by mouth daily.      Marland Kitchen atorvastatin (LIPITOR) 80 MG tablet Take 80 mg by mouth daily.        . bisoprolol (ZEBETA) 10 MG tablet Take 15 mg by mouth daily. Takes 1.5 tablet      . Blood Pressure Monitoring (RA BLOOD PRESSURE CUFF MONITOR) MISC 1 Device. 1 Device by Misc.(Non-Drug; Combo Route) route as needed.      Marland Kitchen buPROPion (WELLBUTRIN SR) 150 MG 12 hr tablet Take 150 mg by mouth 2 (two) times daily.      Marland Kitchen lisinopril (PRINIVIL,ZESTRIL) 10 MG tablet Take 10 mg by mouth daily.      Marland Kitchen LORazepam (ATIVAN) 2 MG tablet Take 1 tablet (2 mg  total) by mouth every 8 (eight) hours as needed for anxiety.  45 tablet  0  . Multiple Vitamins-Minerals (MULTIVITAMIN WITH MINERALS) tablet Take 1 tablet by mouth daily.        . nitroGLYCERIN (NITROSTAT) 0.4 MG SL tablet Place 1 tablet (0.4 mg total) under the tongue every 5 (five) minutes as needed (up to 3 doses). For chest pain      . traMADol (ULTRAM) 50 MG tablet Take by mouth. Take 50 mg by mouth every 6 (six) hours as needed for pain.      Marland Kitchen oxyCODONE-acetaminophen (PERCOCET) 10-325 MG per tablet Take 1 tablet by mouth 2 (two) times daily as needed for pain.  60 tablet  0  . traZODone (DESYREL) 50 MG tablet Take 1 tablet (50 mg total) by mouth at bedtime.  90 tablet  3   No current facility-administered medications for this encounter.    Physical Findings: The patient is in no acute distress. Patient is alert and oriented.  height is 5\' 5"  (1.651 m) and weight is 152 lb 1.6 oz (68.992 kg). His temperature is 98.2 F (36.8 C). His blood pressure is 105/68 and his pulse is 82. .   General: Well-developed, in no acute  distress HEENT: Normocephalic, atraumatic, demonstrates hyperpigmentation with healing radiation change Cardiovascular: Regular rate and rhythm Respiratory: Clear to auscultation bilaterally GI: Soft, nontender, normal bowel sounds Extremities: No edema present   Lab Findings: Lab Results  Component Value Date   WBC 7.8 09/28/2012   HGB 10.9* 09/28/2012   HCT 32.0* 09/28/2012   MCV 97.6 09/28/2012   PLT 464* 09/28/2012     Radiographic Findings: Nm Pet Image Initial (pi) Skull Base To Thigh  03/10/2013   CLINICAL DATA:  Subsequent treatment strategy for squamous cell carcinoma of the tongue diagnosed May 2014. Right lower lobe pulmonary nodule with central necrosis, evaluate for metastatic disease.  EXAM: NUCLEAR MEDICINE PET SKULL BASE TO THIGH  FASTING BLOOD GLUCOSE:  Value: 88mg /dl  TECHNIQUE: 16.1 mCi W-96 FDG was injected intravenously. CT data was obtained and  used for attenuation correction and anatomic localization only. (This was not acquired as a diagnostic CT examination.) Additional exam technical data entered on technologist worksheet.  COMPARISON:  Chest CT 03/03/2013, CT chest, abdomen, pelvis 10/15/2012  FINDINGS: NECK  No hypermetabolic lymph nodes in the neck. Minimally asymmetrically prominent right tonsillar parapharyngeal uptake is identified without measurable mass lesion, SUV maximum 7.8. Postsurgical and presumed brachytherapy related changes are noted involving the tongue. There is mild non masslike uptake within the tongue base, SUV maximum 4.4.  Bilateral maxillary sinusitis.  CHEST  The cavitary right lower lobe pulmonary nodule demonstrates malignant range FDG uptake, SUV maximum 3.4. It has enlarged in size, now measuring 1.8 cm compared to 1.3 cm on the prior exam. No other area of abnormal intrathoracic uptake is identified. Diffuse non mass like mildly prominent FDG uptake is noted within the esophagus which could be due to motion or esophagitis. Neoplasm would be less likely but possible.  ABDOMEN/PELVIS  No abnormal hypermetabolic activity within the liver, pancreas, adrenal glands, or spleen. No hypermetabolic lymph nodes in the abdomen or pelvis.  SKELETON  No focal hypermetabolic activity to suggest skeletal metastasis.  IMPRESSION: Malignant range FDG uptake within an enlarging right lower lobe cavitary pulmonary nodule, highly suspicious for squamous cell metastatic disease. Primary bronchogenic carcinoma could have a similar appearance. Cavitary pneumonia or other infection/ inflammatory etiology is possible but less likely.  FDG uptake within the tongue, for which the differential consideration includes malignant involvement given the history of tongue cancer, motion artifact, or treatment effect.   Electronically Signed   By: Christiana Pellant M.D.   On: 03/10/2013 14:10    Impression:    The patient has a suspicious nodule within the  right lower lobe of the lung. This demonstrated hypermetabolic activity on PET scan with the differential including metastatic squamous carcinoma from the tongue versus a primary bronchogenic carcinoma. No evidence of additional metastatic disease.  I discussed with the patient that we do not have a pathologic diagnosis which would be very preferable. It is possible however that biopsy would confirm malignancy but would not clarify the site of origin. Nevertheless, I discussed with the patient and his wife that a biopsy would be the standard if feasible. Dr. Basilio Cairo notes that radiology feels that the patient would be at significant risk for a pneumothorax with a cutaneous approach. The lesion is fairly parenteral/inferior within the lung and I am not sure if ENB would be an option. The patient is scheduled to see Dr. Dorris Fetch began evaluate him for possible further workup as well as discuss potential resection with the patient. Notably, the patient indicated today that he was  not interested in surgery but I believe that he needs to have this discussion with Dr. Dorris Fetch before making a final decision.    Plan:  I will schedule the patient to return to clinic for followup after he sees Dr. Dorris Fetch unless he is makes the decision to go ahead and proceed with surgery.    Radene Gunning, M.D., Ph.D.   I spent 40 minutes with the patient today, the majority of which was spent counseling the patient on the diagnosis of cancer and coordinating care.

## 2013-04-05 NOTE — Telephone Encounter (Signed)
Called pt with appt for Dr. Dorris Fetch on 04/19/13 at 9:15.  I gave pt address of appt.  He verbalized understanding of appt time and place.

## 2013-04-18 NOTE — Addendum Note (Signed)
Encounter addended by: Jonna Coup, MD on: 04/18/2013  9:23 AM<BR>     Documentation filed: Visit Diagnoses

## 2013-04-19 ENCOUNTER — Ambulatory Visit: Payer: Self-pay | Admitting: Thoracic Surgery (Cardiothoracic Vascular Surgery)

## 2013-04-20 ENCOUNTER — Telehealth: Payer: Self-pay

## 2013-04-20 NOTE — Telephone Encounter (Signed)
Patient's wife Rene Kocher called stating he had a fever over the nihgt of 102.0 , she gave him some tylenol and his temperature is now 98.2, other symptoms include fatigue and nausea and vomiting.States she is also having upper respiratory symptoms.Told to push fluids, rest and monitor temp., if greater than 100.5 to call doctor or go to emergency department.States understanding.

## 2013-04-26 ENCOUNTER — Encounter: Payer: Self-pay | Admitting: Thoracic Surgery (Cardiothoracic Vascular Surgery)

## 2013-04-26 ENCOUNTER — Ambulatory Visit (INDEPENDENT_AMBULATORY_CARE_PROVIDER_SITE_OTHER): Payer: Self-pay | Admitting: Thoracic Surgery (Cardiothoracic Vascular Surgery)

## 2013-04-26 ENCOUNTER — Other Ambulatory Visit: Payer: Self-pay | Admitting: *Deleted

## 2013-04-26 VITALS — BP 98/64 | HR 88 | Resp 16 | Ht 68.0 in | Wt 147.0 lb

## 2013-04-26 DIAGNOSIS — C029 Malignant neoplasm of tongue, unspecified: Secondary | ICD-10-CM

## 2013-04-26 DIAGNOSIS — R911 Solitary pulmonary nodule: Secondary | ICD-10-CM

## 2013-04-26 DIAGNOSIS — J984 Other disorders of lung: Secondary | ICD-10-CM

## 2013-04-26 NOTE — Progress Notes (Signed)
PCP is Sanda Linger, MD Referring Provider is Jonna Coup, MD/ [facility]  Chief Complaint  Patient presents with  . Lung Lesion    RLLobe...has cancer of the tongue     HPI: 57 year old gentleman presents for evaluation of a right lung nodule.  Trevor Griffin is a 57 year old gentleman with an 80-pack-year history of smoking. Back in May he was diagnosed with squamous cell carcinoma of the tongue. This turned out to be a T3 N1, grade 2 lesion. He underwent resection and reconstruction at [facility]. He then was treated with 60 gray of radiation in 30 fractions by Dr. Mitzi Hansen. Radiation was completed in August.  On his initial a CT of the chest earlier in the year he had a 4 mm right lower lobe nodule. A repeat CT in October the lesion had grown to 12 mm and was cavitary. A PET CT was done on November 6. By that time the lesion had increased in size again, although I think that this was in large part due to the methodology of the measurement. The lesion was hypermetabolic with malignant range uptake.  He saw Dr. Mitzi Hansen to discuss possible SBRT of the right lower lobe lesion. Dr. Mitzi Hansen wanted to obtain a biopsy before radiation if possible, but also wanted him evaluated as a possible surgical candidate. He had to cancel multiple appointments with me in November and December due to him and multiple family members having the flu.  He has been having difficulty swallowing since his tongue surgery. He has lost 32 pounds over the past 6 months and 18 pounds over the past 3 months. He currently is 147 pounds. His appetite is poor. He also complains of decreased energy. He does get short of breath with exertion and has a dry cough. He also complains of anxiety, depression, and nervousness.  He does have an extensive cardiac history with 2 heart attacks in 2008 and 2009. In 2008 he had multiple stents placed for LAD occlusion. In 2009 he had a stent thrombosis requiring re\re intervention. He  also had additional stent placed in 2012. He was cleared for surgery back in June by the cardiologist at [facility] although he is followed by Dr. Shirlee Latch here in [place]. He did not have any cardiac issues with his head and neck surgery. He is not having any chest pain, pressure, or tightness.   ECoG/Zubrod score = 1  Past Medical History  Diagnosis Date  . History of tobacco abuse     quit Oct '12  . Hyperlipidemia   . Ischemic cardiomyopathy     Echo 08/20/11 with EF 50-55% (previously 35%)  . Hypertension   . Obesity   . Depression   . CAD (coronary artery disease)     Ant STEMI 5/08 with 4 Taxus DES proximal to mid LAD. 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 Cath in 2/09 also showed 80% mid to distal CFX stenosis. Myoview (6/11): EF 37%, large anteroapical infarct with no ischemia. [place] 10/12: LHC with prox to mid LAD stent ok with 10-20% ISR, mLAD 40%, CFX 30-40%, mCFX 90%, EF 35% - DES to mCFX  . GERD (gastroesophageal reflux disease)   . COPD (chronic obstructive pulmonary disease)   . Alcohol abuse   . Fatigue     sleep test neg 2008  . Anterior myocardial infarction 2008  . Palpitations     For event monitor 01/2012  . Tongue cancer   .  Hx of radiation therapy 10/25/12-12/10/12     Right Tongue/ Bilateral Neck //Total Dose, 60 Gy in 30 Fractions    Past Surgical History  Procedure Laterality Date  . Cardiac catheterization      ANTERIOR STEMI W/4  OVERLAPPING TAXUS STENTS IN THE PROXIMAL TO MID LAD    Family History  Problem Relation Age of Onset  . Kidney disease Mother     CHRONIC  . Cancer Sister   . Liver disease Sister     LIVER FAILURE    Social History History  Substance Use Topics  . Smoking status: Former Smoker -- 2.00 packs/day for 40 years    Types: Cigarettes    Quit date: 09/02/2012  . Smokeless tobacco: Not on file     Comment: Stopped smoking cigars day before surgery  . Alcohol  Use: Yes     Comment: 8-12 BEERS DAILY prior to surgery.  2 beers daily at present time    Current Outpatient Prescriptions  Medication Sig Dispense Refill  . aspirin 325 MG tablet Take 325 mg by mouth daily.      Marland Kitchen atorvastatin (LIPITOR) 80 MG tablet Take 80 mg by mouth daily.        . bisoprolol (ZEBETA) 10 MG tablet Take 15 mg by mouth daily. Takes 1.5 tablet      . Blood Pressure Monitoring (RA BLOOD PRESSURE CUFF MONITOR) MISC 1 Device. 1 Device by Misc.(Non-Drug; Combo Route) route as needed.      Marland Kitchen buPROPion (WELLBUTRIN SR) 150 MG 12 hr tablet Take 150 mg by mouth 2 (two) times daily.      Marland Kitchen lisinopril (PRINIVIL,ZESTRIL) 10 MG tablet Take 10 mg by mouth daily.      Marland Kitchen LORazepam (ATIVAN) 2 MG tablet Take 1 tablet (2 mg total) by mouth every 8 (eight) hours as needed for anxiety.  45 tablet  0  . Multiple Vitamins-Minerals (MULTIVITAMIN WITH MINERALS) tablet Take 1 tablet by mouth daily.        . nitroGLYCERIN (NITROSTAT) 0.4 MG SL tablet Place 1 tablet (0.4 mg total) under the tongue every 5 (five) minutes as needed (up to 3 doses). For chest pain      . oxyCODONE-acetaminophen (PERCOCET) 10-325 MG per tablet Take 1 tablet by mouth 2 (two) times daily as needed for pain.  60 tablet  0  . traMADol (ULTRAM) 50 MG tablet Take by mouth. Take 50 mg by mouth every 6 (six) hours as needed for pain.      . traZODone (DESYREL) 50 MG tablet Take 1 tablet (50 mg total) by mouth at bedtime.  90 tablet  3   No current facility-administered medications for this visit.    Allergies  Allergen Reactions  . Nubain [Nalbuphine Hcl]     Headache and nausea  . Hydromorphone Other (See Comments)    Headache,nausea    Review of Systems  Constitutional: Positive for activity change, appetite change, fatigue and unexpected weight change. Negative for fever and chills.  HENT: Positive for dental problem (Dentures).   Respiratory: Positive for cough. Negative for wheezing.   Gastrointestinal:        Difficulty swallowing  Musculoskeletal: Positive for myalgias.       Leg cramps  Neurological: Positive for numbness (hands and feet).  Psychiatric/Behavioral: Positive for dysphoric mood. The patient is nervous/anxious.     BP 98/64  Pulse 88  Resp 16  Ht 5\' 8"  (1.727 m)  Wt 147 lb (66.679 kg)  BMI  22.36 kg/m2  SpO2 98% Physical Exam  Vitals reviewed. Constitutional: He is oriented to person, place, and time. He appears well-developed and well-nourished. No distress.  HENT:  Multiple well-healed surgical scars right face and neck  Eyes: EOM are normal. Pupils are equal, round, and reactive to light.  Neck: Neck supple.  Cardiovascular: Normal rate, regular rhythm and normal heart sounds.  Exam reveals no gallop and no friction rub.   No murmur heard. Pulmonary/Chest: Effort normal. He has no wheezes. He has no rales.  Diminished breath sounds bilaterally  Abdominal: Soft. There is no tenderness.  Musculoskeletal: He exhibits no edema.  Lymphadenopathy:    He has no cervical adenopathy.  Neurological: He is alert and oriented to person, place, and time.  No focal motor deficit  Skin: Skin is warm and dry.  Psychiatric:  Flat affect     Diagnostic Tests: CT chest 03/03/2013 CLINICAL DATA: Neck stiffness and dysphasia  EXAM:  CT CHEST WITH CONTRAST  TECHNIQUE:  Multidetector CT imaging of the chest was performed during  intravenous contrast administration.  CONTRAST: OMNIPAQUE IOHEXOL 300 MG/ML SOLN  COMPARISON: 10/15/2012  FINDINGS:  No pleural effusion identified. Right upper lobe pulmonary nodule  measures 3 mm, image 27/ series 5. Nodule within the right lower  lobe appears increased from previous exam now measuring 1.3 cm. This  now has a cavitary component, image 50/series 5. On the previous  exam this measured 4 mm. The heart size appears normal. No  pericardial effusions identified. No mediastinal or hilar lymph  nodes identified.  Left hilar lymph  node measures 7 mm and is unchanged from previous  exam. No enlarged axillary lymph nodes. No supraclavicular  adenopathy identified. Limited imaging through the upper abdomen  shows stone within the gallbladder measuring 8 mm. No acute upper  abdominal CT findings noted.  Review of the visualized bony structures demonstrates no aggressive  lytic or sclerotic bone lesions.  IMPRESSION:  1. There is an enlarging (and now cavitary) nodule within the right  lower lobe. Suspicious for squamous cell carcinoma metastasis.  Consider further evaluation with PET-CT.  Electronically Signed  By: Signa Kell M.D.  On: 03/03/2013 11:49  PET/CT 03/10/2013 CLINICAL DATA: Subsequent treatment strategy for squamous cell  carcinoma of the tongue diagnosed May 2014. Right lower lobe  pulmonary nodule with central necrosis, evaluate for metastatic  disease.  EXAM:  NUCLEAR MEDICINE PET SKULL BASE TO THIGH  FASTING BLOOD GLUCOSE: Value: 88mg /dl  TECHNIQUE:  40.9 mCi W-11 FDG was injected intravenously. CT data was obtained  and used for attenuation correction and anatomic localization only.  (This was not acquired as a diagnostic CT examination.) Additional  exam technical data entered on technologist worksheet.  COMPARISON: Chest CT 03/03/2013, CT chest, abdomen, pelvis  10/15/2012  FINDINGS:  NECK  No hypermetabolic lymph nodes in the neck. Minimally asymmetrically  prominent right tonsillar parapharyngeal uptake is identified  without measurable mass lesion, SUV maximum 7.8. Postsurgical and  presumed brachytherapy related changes are noted involving the  tongue. There is mild non masslike uptake within the tongue base,  SUV maximum 4.4.  Bilateral maxillary sinusitis.  CHEST  The cavitary right lower lobe pulmonary nodule demonstrates  malignant range FDG uptake, SUV maximum 3.4. It has enlarged in  size, now measuring 1.8 cm compared to 1.3 cm on the prior exam. No  other area of  abnormal intrathoracic uptake is identified. Diffuse  non mass like mildly prominent FDG uptake is noted within the  esophagus which could be due to motion or esophagitis. Neoplasm  would be less likely but possible.  ABDOMEN/PELVIS  No abnormal hypermetabolic activity within the liver, pancreas,  adrenal glands, or spleen. No hypermetabolic lymph nodes in the  abdomen or pelvis.  SKELETON  No focal hypermetabolic activity to suggest skeletal metastasis.  IMPRESSION:  Malignant FDG uptake within an enlarging right lower lobe  cavitary pulmonary nodule, highly suspicious for squamous cell  metastatic disease. Primary bronchogenic carcinoma could have a  similar appearance. Cavitary pneumonia or other infection/  inflammatory etiology is possible but less likely.  FDG uptake within the tongue, for which the differential  consideration includes malignant involvement given the history of  tongue cancer, motion artifact, or treatment effect.  Electronically Signed  By: Christiana Pellant M.D.  On: 03/10/2013 14:10        Impression: 57 year old gentleman with a heavy smoking history, who recently had a T3 N1 squamous cell carcinoma the tongue treated with surgery and radiation. He now has a right lower lobe nodule that has increased in size on serial exams and demonstrates malignant range FDG uptake with SUV of 3.4 on PET CT.  This lesion could possibly represent metastatic disease from his tongue cancer, but also could be a primary lung cancer. In any event it has to be considered a cancer so as can be proven otherwise. I had a long discussion with Mr. and Mrs. Beavers regarding potential diagnostic and treatment options. We discussed the possibility of biopsy prior to radiation treatment versus an excisional wedge resection for definitive diagnosis and treatment. We discussed the relative advantages and disadvantages of each approach. I did discuss in detail with them the possible approaches to  biopsy including CT-guided biopsy versus electromagnetic navigational bronchoscopy. They do understand that the negative result with either of those is not definitively rule out cancer.  I discussed with them the nature of thoracoscopic wedge resection. They understand this would require general anesthesia. He may be difficult to place a double-lumen endotracheal tube due to his prior oral surgery, so the procedure might have to be done with a bronchial blocker.  We did discuss the indications, risks, benefits, and alternatives. They understand the need for general anesthesia and the incisions be used. They understand the risk include but are not limited to death, MI, DVT, PE, stroke, bleeding, possible need for transfusion, infection, air leak, cardiac arrhythmias, as well as the potential for unforeseeable complications.  After discussion he is interested in possible surgical resection. There's been a relatively long interval since his scans the multiple missed appointments. As it is now over 6 weeks since his PET scan I think he definitely needs a new CT of the chest. He also needs pulmonary function testing.  Plan: CT of chest  Pulmonary function testing with and without bronchodilators  Return in 2 weeks to discuss results and possibly plan surgery if he chooses to proceed.

## 2013-05-03 ENCOUNTER — Inpatient Hospital Stay (HOSPITAL_COMMUNITY): Admission: RE | Admit: 2013-05-03 | Payer: Self-pay | Source: Ambulatory Visit

## 2013-05-03 ENCOUNTER — Ambulatory Visit (HOSPITAL_COMMUNITY): Admission: RE | Admit: 2013-05-03 | Payer: Self-pay | Source: Ambulatory Visit

## 2013-05-09 ENCOUNTER — Ambulatory Visit (HOSPITAL_COMMUNITY)
Admission: RE | Admit: 2013-05-09 | Discharge: 2013-05-09 | Disposition: A | Discharge status: Home / Self Care | Payer: Medicaid Other | Source: Ambulatory Visit | Attending: Thoracic Surgery (Cardiothoracic Vascular Surgery) | Admitting: Thoracic Surgery (Cardiothoracic Vascular Surgery)

## 2013-05-09 ENCOUNTER — Encounter (HOSPITAL_COMMUNITY): Payer: Self-pay

## 2013-05-09 DIAGNOSIS — R911 Solitary pulmonary nodule: Secondary | ICD-10-CM

## 2013-05-09 DIAGNOSIS — C029 Malignant neoplasm of tongue, unspecified: Secondary | ICD-10-CM | POA: Insufficient documentation

## 2013-05-09 DIAGNOSIS — K8020 Calculus of gallbladder without cholecystitis without obstruction: Secondary | ICD-10-CM | POA: Insufficient documentation

## 2013-05-09 LAB — PULMONARY FUNCTION TEST
DL/VA % pred: 111 %
DL/VA: 4.76 ml/min/mmHg/L
DLCO COR % PRED: 104 %
DLCO cor: 26.71 ml/min/mmHg
DLCO unc % pred: 104 %
DLCO unc: 26.71 ml/min/mmHg
FEF 25-75 POST: 3.33 L/s
FEF 25-75 PRE: 2.26 L/s
FEF2575-%Change-Post: 47 %
FEF2575-%PRED-POST: 124 %
FEF2575-%PRED-PRE: 84 %
FEV1-%Change-Post: 7 %
FEV1-%Pred-Post: 97 %
FEV1-%Pred-Pre: 90 %
FEV1-Post: 3.02 L
FEV1-Pre: 2.81 L
FEV1FVC-%Change-Post: 3 %
FEV1FVC-%Pred-Pre: 100 %
FEV6-%CHANGE-POST: 3 %
FEV6-%PRED-POST: 99 %
FEV6-%PRED-PRE: 95 %
FEV6-POST: 3.82 L
FEV6-Pre: 3.68 L
FEV6FVC-%PRED-POST: 105 %
FEV6FVC-%Pred-Pre: 105 %
FVC-%CHANGE-POST: 4 %
FVC-%PRED-POST: 94 %
FVC-%PRED-PRE: 90 %
FVC-PRE: 3.68 L
FVC-Post: 3.83 L
PRE FEV1/FVC RATIO: 76 %
Post FEV1/FVC ratio: 79 %
Post FEV6/FVC ratio: 100 %
Pre FEV6/FVC Ratio: 100 %
RV % pred: 171 %
RV: 3.28 L
TLC % pred: 118 %
TLC: 7.07 L

## 2013-05-09 MED ORDER — ALBUTEROL SULFATE (2.5 MG/3ML) 0.083% IN NEBU
2.5000 mg | INHALATION_SOLUTION | Freq: Once | RESPIRATORY_TRACT | Status: AC
  Administered 2013-05-09: 2.5 mg via RESPIRATORY_TRACT

## 2013-05-10 ENCOUNTER — Ambulatory Visit: Payer: Medicaid Other | Admitting: Thoracic Surgery (Cardiothoracic Vascular Surgery)

## 2013-05-11 ENCOUNTER — Telehealth: Payer: Self-pay | Admitting: *Deleted

## 2013-05-11 NOTE — Telephone Encounter (Signed)
Called patient as part of routine s/p treatment follow-up.  Patient indicated had CT scan Monday, has appt with Dr. Roxan Hockey next week to discuss findings.  Stated that disability has been approved retroactively November '14.  Stated he continues to lose weight (currently 147 lbs) though not experiencing swallowing difficulty or pain.  Eating soft foods, drinking Ensure (able to get a case of 24 cans for $5.00 through s-in-law who works for [employer]).  Will continue to navigate as L3 (treatments completed) patient.  [name], RN, BSN, [facility] Neck Oncology Navigator [phone]

## 2013-05-17 ENCOUNTER — Ambulatory Visit (INDEPENDENT_AMBULATORY_CARE_PROVIDER_SITE_OTHER): Payer: Medicaid Other | Admitting: Thoracic Surgery (Cardiothoracic Vascular Surgery)

## 2013-05-17 ENCOUNTER — Encounter: Payer: Self-pay | Admitting: Thoracic Surgery (Cardiothoracic Vascular Surgery)

## 2013-05-17 ENCOUNTER — Telehealth: Payer: Self-pay | Admitting: *Deleted

## 2013-05-17 ENCOUNTER — Other Ambulatory Visit: Payer: Self-pay | Admitting: *Deleted

## 2013-05-17 VITALS — BP 124/82 | HR 94 | Resp 20 | Ht 68.0 in | Wt 147.0 lb

## 2013-05-17 DIAGNOSIS — R911 Solitary pulmonary nodule: Secondary | ICD-10-CM

## 2013-05-17 DIAGNOSIS — J984 Other disorders of lung: Secondary | ICD-10-CM

## 2013-05-17 DIAGNOSIS — R918 Other nonspecific abnormal finding of lung field: Secondary | ICD-10-CM

## 2013-05-17 NOTE — Progress Notes (Signed)
HPI:  58 year old gentleman presents for evaluation of a right lung nodule.  Trevor Griffin is a 58 year old gentleman with an 80-pack-year history of smoking. Back in May he was diagnosed with squamous cell carcinoma of the tongue. This turned out to be a T3 N1, grade 2 lesion. He underwent resection and reconstruction at [facility]. He then was treated with 60 gray of radiation in 30 fractions by Dr. Lisbeth Renshaw. Radiation was completed in August.  On his initial a CT of the chest earlier in the year he had a 4 mm right lower lobe nodule. A repeat CT in October the lesion had grown to 12 mm and was cavitary. A PET CT was done on November 6. By that time the lesion had increased in size again, although I think that this was in large part due to the methodology of the measurement. The lesion was hypermetabolic with malignant range uptake.  He saw Dr. Lisbeth Renshaw to discuss possible SBRT of the right lower lobe lesion. Dr. Lisbeth Renshaw wanted to obtain a biopsy before radiation if possible, but also wanted him evaluated as a possible surgical candidate. He had to cancel multiple appointments with me in November and December due to him and multiple family members having the flu.   He has been having difficulty swallowing since his tongue surgery. He has lost 32 pounds over the past 6 months and 18 pounds over the past 3 months. He currently is 147 pounds. His appetite is poor. He also complains of decreased energy. He does get short of breath with exertion and has a dry cough. He also complains of anxiety, depression, and nervousness. Also complains of chronic pain in his right arm and some limitation of motion in the right arm. He also has pain and limitation of motion in his right leg. He is able to ambulate without assistance.  He continues to smoke.  He does have an extensive cardiac history with 2 heart attacks in 2008 and 2009. In 2008 he had multiple stents placed for LAD occlusion. In 2009 he had a stent  thrombosis requiring re\re intervention. He also had additional stent placed in 2012. He was cleared for surgery back in June by the cardiologist at [facility] although he is followed by Dr. Aundra Dubin here in [place]. He did not have any cardiac issues with his head and neck surgery. He is not having any chest pain, pressure, or tightness.  Past Medical History  Diagnosis Date  . History of tobacco abuse     quit Oct '12  . Hyperlipidemia   . Ischemic cardiomyopathy     Echo 08/20/11 with EF 50-55% (previously 35%)  . Hypertension   . Obesity   . Depression   . CAD (coronary artery disease)     Ant STEMI 5/08 with 4 Taxus DES proximal to mid LAD. 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 Cath in 2/09 also showed 80% mid to distal CFX stenosis. Myoview (6/11): EF 37%, large anteroapical infarct with no ischemia. [place] 10/12: LHC with prox to mid LAD stent ok with 10-20% ISR, mLAD 40%, CFX 30-40%, mCFX 90%, EF 35% - DES to mCFX  . GERD (gastroesophageal reflux disease)   . COPD (chronic obstructive pulmonary disease)   . Alcohol abuse   . Fatigue     sleep test neg 2008  . Anterior myocardial infarction 2008  . Palpitations     For event monitor 01/2012  . Tongue  cancer   . Hx of radiation therapy 10/25/12-12/10/12     Right Tongue/ Bilateral Neck //Total Dose, 60 Gy in 30 Fractions     Current Outpatient Prescriptions  Medication Sig Dispense Refill  . aspirin 325 MG tablet Take 325 mg by mouth daily.      Marland Kitchen atorvastatin (LIPITOR) 80 MG tablet Take 80 mg by mouth daily.        . bisoprolol (ZEBETA) 10 MG tablet Take 15 mg by mouth daily. Takes 1.5 tablet      . Blood Pressure Monitoring (RA BLOOD PRESSURE CUFF MONITOR) MISC 1 Device. 1 Device by Misc.(Non-Drug; Combo Route) route as needed.      Marland Kitchen buPROPion (WELLBUTRIN SR) 150 MG 12 hr tablet Take 150 mg by mouth 2 (two) times daily.      Marland Kitchen lisinopril (PRINIVIL,ZESTRIL) 10 MG  tablet Take 10 mg by mouth daily.      Marland Kitchen LORazepam (ATIVAN) 2 MG tablet Take 1 tablet (2 mg total) by mouth every 8 (eight) hours as needed for anxiety.  45 tablet  0  . Multiple Vitamins-Minerals (MULTIVITAMIN WITH MINERALS) tablet Take 1 tablet by mouth daily.        . nitroGLYCERIN (NITROSTAT) 0.4 MG SL tablet Place 1 tablet (0.4 mg total) under the tongue every 5 (five) minutes as needed (up to 3 doses). For chest pain      . traMADol (ULTRAM) 50 MG tablet Take by mouth. Take 50 mg by mouth every 6 (six) hours as needed for pain.      Marland Kitchen oxyCODONE-acetaminophen (PERCOCET) 10-325 MG per tablet Take 1 tablet by mouth 2 (two) times daily as needed for pain.  60 tablet  0  . traZODone (DESYREL) 50 MG tablet Take 1 tablet (50 mg total) by mouth at bedtime.  90 tablet  3   No current facility-administered medications for this visit.   History   Social History  . Marital Status: Married    Spouse Name: N/A    Number of Children: N/A  . Years of Education: N/A   Occupational History  . Not on file.   Social History Main Topics  . Smoking status: Former Smoker -- 2.00 packs/day for 40 years    Types: Cigarettes    Quit date: 09/02/2012  . Smokeless tobacco: Not on file     Comment: Stopped smoking cigars day before surgery  . Alcohol Use: Yes     Comment: 8-12 BEERS DAILY prior to surgery.  2 beers daily at present time  . Drug Use: No  . Sexual Activity: Yes   Other Topics Concern  . Not on file   Social History Narrative   Alcoholic beverage:  Yes      Drug use:  No      Seatbead Use: Yes      Firearms in home:  Yes      Exercise:  No      Smoke Alarm in your home: Yes                    Physical Exam BP 124/82  Pulse 94  Resp 20  Ht 5\' 8"  (1.727 m)  Wt 147 lb (66.679 kg)  BMI 22.36 kg/m2  SpO37 46% 58 year old male in no acute distress HEENT deformity and well-healed surgical scars Neuro alert and oriented x3, 4/5 strength in right arm with limited  abduction Neck no palpable adenopathy Cardiac regular rate and rhythm normal S1 and S2 Lungs clear  bilaterally, no wheezes Abdomen soft nontender Extremities without edema  Diagnostic Tests:  PET/CT NUCLEAR MEDICINE PET SKULL BASE TO THIGH  FASTING BLOOD GLUCOSE: Value: 88mg /dl  TECHNIQUE:  17.8 mCi F-18 FDG was injected intravenously. CT data was obtained  and used for attenuation correction and anatomic localization only.  (This was not acquired as a diagnostic CT examination.) Additional  exam technical data entered on technologist worksheet.  COMPARISON: Chest CT 03/03/2013, CT chest, abdomen, pelvis  10/15/2012  FINDINGS:  NECK  No hypermetabolic lymph nodes in the neck. Minimally asymmetrically  prominent right tonsillar parapharyngeal uptake is identified  without measurable mass lesion, SUV maximum 7.8. Postsurgical and  presumed brachytherapy related changes are noted involving the  tongue. There is mild non masslike uptake within the tongue base,  SUV maximum 4.4.  Bilateral maxillary sinusitis.  CHEST  The cavitary right lower lobe pulmonary nodule demonstrates  malignant range FDG uptake, SUV maximum 3.4. It has enlarged in  size, now measuring 1.8 cm compared to 1.3 cm on the prior exam. No  other area of abnormal intrathoracic uptake is identified. Diffuse  non mass like mildly prominent FDG uptake is noted within the  esophagus which could be due to motion or esophagitis. Neoplasm  would be less likely but possible.  ABDOMEN/PELVIS  No abnormal hypermetabolic activity within the liver, pancreas,  adrenal glands, or spleen. No hypermetabolic lymph nodes in the  abdomen or pelvis.  SKELETON  No focal hypermetabolic activity to suggest skeletal metastasis.  IMPRESSION:  Malignant range FDG uptake within an enlarging right lower lobe  cavitary pulmonary nodule, highly suspicious for squamous cell  metastatic disease. Primary bronchogenic carcinoma could have a   similar appearance. Cavitary pneumonia or other infection/  inflammatory etiology is possible but less likely.  FDG uptake within the tongue, for which the differential  consideration includes malignant involvement given the history of  tongue cancer, motion artifact, or treatment effect.  Electronically Signed  By: Conchita Paris M.D.  On: 03/10/2013 14:10   CT chest 05/09/2013 CT CHEST WITHOUT CONTRAST  TECHNIQUE:  Multidetector CT imaging of the chest was performed following the  standard protocol without IV contrast.  COMPARISON: PET-CT on 03/10/2013 and chest CT on 03/03/2013  FINDINGS:  Further enlargement of cavitary pulmonary nodule is seen in the  posterior right lower lobe, which now measures 19 mm on image 50  compared to 13 mm previously. This is consistent with metastatic  squamous cell carcinoma. There is also increased size of on adjacent  less well-defined nodular opacity in the posterior right lung base  which may be due to adjacent atelectasis or pneumonitis, although  neoplasm cannot definitely be excluded. No other suspicious  pulmonary nodules or masses are seen in either lung.  No evidence of hilar or mediastinal lymphadenopathy. No evidence of  pleural or pericardial effusion. No evidence of pulmonary infiltrate  or central endobronchial obstruction. No evidence of chest wall mass  or suspicious bone lesions. Both adrenal glands are normal in  appearance. Cholelithiasis again noted.  IMPRESSION:  Further increase in size of cavitary pulmonary nodule in the  posterior right lower lobe now measuring 19 mm, with adjacent  ill-defined nodular opacity possibly due to associated  postobstructive atelectasis or pneumonitis. This is most consistent  with a pulmonary metastasis from squamous cell carcinoma.  No other sites of neoplasm identified within the thorax.  Cholelithiasis. No radiographic evidence of cholecystitis.  Electronically Signed  By: Earle Gell  M.D.  On: 05/09/2013 12:26  Pulmonary Function tests FVC 3.68 (90%) FEV1 2.81 (90%)  FEV1/FVC 76% DLCO 104%  Impression: 58 year old gentleman with a history of tobacco abuse who has a cavitary lung mass in the right lower lobe. He recently was treated for squamous cell carcinoma the tongue. This lesion could represent either a metastatic site from the tongue cancer or could be a second primary lung cancer. Either way I think his best chance for survival is with surgical resection. I would say that is a concern given his recent had neck surgery, particularly is concerned airway issues and double-lumen tube placement. We may have to do the procedure with a bronchial blocker.  I discussed the options of surgical resection versus biopsy followed by SB RT. We discussed the advantages and disadvantages of each approach. I do think his best chance for cure is for surgery, albeit at relatively high risk. By the same token there is less morbidity and mortality risk with radiation, but likely a lower survival rate long-term.  The planned procedure would be a right VATS, wedge resection, then possible segmentectomy or lobectomy if this turns out to be a primary lung cancer.  I discussed with the patient and his wife the general nature of the procedure, the intraoperative decision making, general anesthesia, the expected hospital stay and overall recovery. We discussed the indications, risks, benefits, and alternatives. They understand that the risks include, but are not limited to death, MI, DVT, PE, bleeding, possible need for transfusion, infection, including aspiration pneumonia, air leak, cardiac arrhythmias, airway complications, as well as the possibility of other unforeseeable complications.  He understands the risks and wishes to proceed.  He has run out of Percocet and asked for a refill. He takes that for right shoulder and arm pain and right leg pain that is followed his head and neck surgery  and radiation. I gave him a prescription for Percocet 5/325 one to 2 twice daily as needed for pain, 30 tablets, no refills.   Plan: Right VATS, wedge resection, possible segmentectomy or lobectomy on Thursday, January 22.

## 2013-05-20 ENCOUNTER — Telehealth: Payer: Self-pay | Admitting: *Deleted

## 2013-05-20 NOTE — Telephone Encounter (Addendum)
Received VM from patient wife who indicated patient is out of several medications and needs financial assistance.  I contacted Jodelle Green, RadOnc Patient Financial Advocate with this information.  [name], RN, BSN, [facility] Head & Neck Oncology Navigator [phone]

## 2013-05-20 NOTE — Telephone Encounter (Signed)
Per e-mail update from [employer], I called patient to inform him that financial assistance for is Rx is available.  Patient provided me a list of medications needing refill; I am forwarding to physician.  Patient understands Rx will need to be picked up at [facility].   In response to my inquiry re: his decision re: treatment decision for lung cancer, he stated he cancelled surgery scheduled next week with Dr. Roxan Hockey.  He stated he didn't "feel strong enough" and that he wanted to wait until his Feb 13 appt with Dr. Isidore Moos to discuss his "situation".  I encouraged him to not wait until then.  I spoke with Dr. Lisbeth Renshaw, who has been in consult with Dr. Roxan Hockey, following this conversation and he agreed to see patient next week to discuss options for moving forward with a tmt plan.  Subsequent to this conversation, received call from patient wife who stated that patient now wants to proceed with surgery and that she called Dr. Leonarda Salon office, LVM with his nurse to this effect.  Will continue to follow.  [name], RN, BSN, [facility] Head & Neck Oncology Navigator [phone]

## 2013-05-23 ENCOUNTER — Other Ambulatory Visit: Payer: Self-pay | Admitting: Radiation Oncology

## 2013-05-23 ENCOUNTER — Telehealth: Payer: Self-pay | Admitting: *Deleted

## 2013-05-23 DIAGNOSIS — C029 Malignant neoplasm of tongue, unspecified: Secondary | ICD-10-CM

## 2013-05-23 DIAGNOSIS — F419 Anxiety disorder, unspecified: Secondary | ICD-10-CM

## 2013-05-23 MED ORDER — OXYCODONE-ACETAMINOPHEN 10-325 MG PO TABS: 1.0000 | ORAL_TABLET | Freq: Two times a day (BID) | ORAL | Status: DC | PRN

## 2013-05-23 MED ORDER — ASPIRIN 325 MG PO TABS: 325.0000 mg | ORAL_TABLET | Freq: Every day | ORAL | Status: DC

## 2013-05-23 MED ORDER — TRAZODONE HCL 50 MG PO TABS: 50.0000 mg | ORAL_TABLET | Freq: Every day | ORAL | Status: DC

## 2013-05-23 MED ORDER — ATORVASTATIN CALCIUM 80 MG PO TABS: 80.0000 mg | ORAL_TABLET | Freq: Every day | ORAL | Status: DC

## 2013-05-23 MED ORDER — LORAZEPAM 2 MG PO TABS: 2.0000 mg | ORAL_TABLET | Freq: Three times a day (TID) | ORAL | Status: DC | PRN

## 2013-05-23 NOTE — Telephone Encounter (Signed)
Called patient to verify that he had picked up Rx refills at [facility]; he stated he had and reiterated appreciation for assistance.  Indicated that his lung surgery is tentatively re-scheduled for 06/03/13.  Will continue to navigate as L3 (treatments completed) patient.  [name], RN, BSN, [facility] Head & Neck Oncology Navigator [phone]

## 2013-05-24 ENCOUNTER — Other Ambulatory Visit (HOSPITAL_COMMUNITY): Payer: Medicaid Other

## 2013-05-26 ENCOUNTER — Encounter (HOSPITAL_COMMUNITY): Admission: RE | Payer: Self-pay | Source: Ambulatory Visit

## 2013-05-26 ENCOUNTER — Inpatient Hospital Stay (HOSPITAL_COMMUNITY)
Admission: RE | Admit: 2013-05-26 | Payer: Medicaid Other | Source: Ambulatory Visit | Admitting: Thoracic Surgery (Cardiothoracic Vascular Surgery)

## 2013-05-26 SURGERY — VIDEO ASSISTED THORACOSCOPY (VATS)/WEDGE RESECTION
Anesthesia: General | Site: Chest | Laterality: Right

## 2013-05-31 ENCOUNTER — Encounter: Payer: Self-pay | Admitting: Thoracic Surgery (Cardiothoracic Vascular Surgery)

## 2013-05-31 ENCOUNTER — Ambulatory Visit (INDEPENDENT_AMBULATORY_CARE_PROVIDER_SITE_OTHER): Payer: Medicaid Other | Admitting: Thoracic Surgery (Cardiothoracic Vascular Surgery)

## 2013-05-31 ENCOUNTER — Other Ambulatory Visit: Payer: Self-pay | Admitting: *Deleted

## 2013-05-31 VITALS — BP 113/73 | HR 76 | Resp 16 | Ht 68.0 in | Wt 147.0 lb

## 2013-05-31 DIAGNOSIS — C029 Malignant neoplasm of tongue, unspecified: Secondary | ICD-10-CM

## 2013-05-31 DIAGNOSIS — R911 Solitary pulmonary nodule: Secondary | ICD-10-CM

## 2013-05-31 DIAGNOSIS — J984 Other disorders of lung: Secondary | ICD-10-CM

## 2013-05-31 NOTE — Progress Notes (Signed)
Patient ID: Trevor Griffin, male   DOB: [birth year], 58 y.o.   MRN: [id number]   [name] returns today to discuss treatment of his right lower lobe lung nodule.  He is a 58 year old gentleman with a history of alcohol and tobacco abuse who had cancer of the tongue treated with surgery and radiation back in May. On a followup CT scan he was found to have a right lung nodule. This was hypermetabolic by PET. He was originally referred consideration for radiation treatment however Dr. Lisbeth Renshaw with him evaluated is a surgical candidate. I did feel that he was a surgical candidate albeit with higher than normal risk due to his history of coronary disease, stents, recent tongue surgery and head and neck irradiation.  He had agreed to surgery and we had set a date in January. However, he became nervous and cancelled the operation. In the meantime he spoke with other physicians and his wife did as well. They encouraged him to proceed with surgery, so he called back to reschedule.  I felt it was important to meet with the patient and his wife again to make sure that they were comfortable with the decision before proceeding.  I once again explained to the [name] that we have 2 options. We could do a bronchoscopic biopsy and refer him for radiation therapy or we could proceed with a right thoracoscopy and wedge resection and possible segmentectomy. We talked about the advantages and disadvantages of each of those approaches. They understand each approach as potential for complications. Certainly, complications associated with surgery tend to be more immediate and more severe. He is not an optimal candidate by any means, but I do think the risks are acceptable and not prohibitive.  Reviewed the general nature of the procedure. He understands the need for general anesthesia. He understands there could be potential difficulties with airway access and duel lumen tube placement. We discussed the expected hospital  stay and overall recovery. They understand the indications, risks, benefits, and alternatives. They understand the risks include, but are not limited to death, MI, DVT, PE, stroke, bleeding, possible need for transfusion, air leaks, cardiac arrhythmias, as well as other unforeseeable complications.  After a lengthy discussion (approximately 20 minutes) with [name] they wish to proceed with surgical resection. We have scheduled him for Monday, February 2.

## 2013-06-01 ENCOUNTER — Encounter (HOSPITAL_BASED_OUTPATIENT_CLINIC_OR_DEPARTMENT_OTHER): Payer: Self-pay | Admitting: Pharmacy Technician

## 2013-06-02 ENCOUNTER — Encounter (HOSPITAL_COMMUNITY): Payer: Self-pay

## 2013-06-02 ENCOUNTER — Encounter (HOSPITAL_COMMUNITY)
Admission: RE | Admit: 2013-06-02 | Discharge: 2013-06-02 | Disposition: A | Discharge status: Home / Self Care | Payer: Medicaid Other | Source: Ambulatory Visit | Attending: Thoracic Surgery (Cardiothoracic Vascular Surgery) | Admitting: Thoracic Surgery (Cardiothoracic Vascular Surgery)

## 2013-06-02 VITALS — BP 120/75 | HR 92 | Temp 98.2°F | Resp 18 | Ht 68.0 in | Wt 154.2 lb

## 2013-06-02 DIAGNOSIS — Z01818 Encounter for other preprocedural examination: Secondary | ICD-10-CM | POA: Insufficient documentation

## 2013-06-02 DIAGNOSIS — R911 Solitary pulmonary nodule: Secondary | ICD-10-CM

## 2013-06-02 DIAGNOSIS — Z01812 Encounter for preprocedural laboratory examination: Secondary | ICD-10-CM | POA: Insufficient documentation

## 2013-06-02 DIAGNOSIS — Z01810 Encounter for preprocedural cardiovascular examination: Secondary | ICD-10-CM | POA: Insufficient documentation

## 2013-06-02 HISTORY — DX: Anxiety disorder, unspecified: F41.9

## 2013-06-02 HISTORY — DX: Headache: R51

## 2013-06-02 LAB — PROTIME-INR
INR: 0.93 (ref 0.00–1.49)
PROTHROMBIN TIME: 12.3 s (ref 11.6–15.2)

## 2013-06-02 LAB — COMPREHENSIVE METABOLIC PANEL
ALT: 37 U/L (ref 0–53)
AST: 43 U/L — ABNORMAL HIGH (ref 0–37)
Albumin: 3.3 g/dL — ABNORMAL LOW (ref 3.5–5.2)
Alkaline Phosphatase: 116 U/L (ref 39–117)
BUN: 6 mg/dL (ref 6–23)
CALCIUM: 9 mg/dL (ref 8.4–10.5)
CO2: 23 mEq/L (ref 19–32)
Chloride: 99 mEq/L (ref 96–112)
Creatinine, Ser: 0.58 mg/dL (ref 0.50–1.35)
GLUCOSE: 83 mg/dL (ref 70–99)
Potassium: 4.5 mEq/L (ref 3.7–5.3)
Sodium: 139 mEq/L (ref 137–147)
Total Bilirubin: 0.3 mg/dL (ref 0.3–1.2)
Total Protein: 7.8 g/dL (ref 6.0–8.3)

## 2013-06-02 LAB — BLOOD GAS, ARTERIAL
ACID-BASE EXCESS: 2.9 mmol/L — AB (ref 0.0–2.0)
BICARBONATE: 26.5 meq/L — AB (ref 20.0–24.0)
Drawn by: 344381
FIO2: 0.21 %
O2 Saturation: 96.9 %
PATIENT TEMPERATURE: 98.6
TCO2: 27.6 mmol/L (ref 0–100)
pCO2 arterial: 37.4 mmHg (ref 35.0–45.0)
pH, Arterial: 7.463 — ABNORMAL HIGH (ref 7.350–7.450)
pO2, Arterial: 86 mmHg (ref 80.0–100.0)

## 2013-06-02 LAB — TYPE AND SCREEN
ABO/RH(D): O NEG
ANTIBODY SCREEN: NEGATIVE

## 2013-06-02 LAB — URINALYSIS, ROUTINE W REFLEX MICROSCOPIC
BILIRUBIN URINE: NEGATIVE
GLUCOSE, UA: NEGATIVE mg/dL
HGB URINE DIPSTICK: NEGATIVE
Ketones, ur: NEGATIVE mg/dL
Leukocytes, UA: NEGATIVE
Nitrite: NEGATIVE
PROTEIN: NEGATIVE mg/dL
SPECIFIC GRAVITY, URINE: 1.005 (ref 1.005–1.030)
UROBILINOGEN UA: 0.2 mg/dL (ref 0.0–1.0)
pH: 6.5 (ref 5.0–8.0)

## 2013-06-02 LAB — CBC
HEMATOCRIT: 38.6 % — AB (ref 39.0–52.0)
HEMOGLOBIN: 13.6 g/dL (ref 13.0–17.0)
MCH: 31.4 pg (ref 26.0–34.0)
MCHC: 35.2 g/dL (ref 30.0–36.0)
MCV: 89.1 fL (ref 78.0–100.0)
Platelets: 126 10*3/uL — ABNORMAL LOW (ref 150–400)
RBC: 4.33 MIL/uL (ref 4.22–5.81)
RDW: 15.3 % (ref 11.5–15.5)
WBC: 2.4 10*3/uL — ABNORMAL LOW (ref 4.0–10.5)

## 2013-06-02 LAB — SURGICAL PCR SCREEN
MRSA, PCR: NEGATIVE
Staphylococcus aureus: NEGATIVE

## 2013-06-02 LAB — APTT: aPTT: 32 seconds (ref 24–37)

## 2013-06-02 NOTE — Pre-Procedure Instructions (Signed)
Trevor Griffin [facility]  06/02/2013   Your procedure is scheduled on:  Monday February 2 nd at 33 AM  Report to [facility] Short Stay Main Entrance "A" at 0730 AM.  Call this number if you have problems the morning of surgery: [phone]   Remember:   Do not eat food or drink liquids after midnight Sunday   Take these medicines the morning of surgery with A SIP OF WATER: Bisoprolol, Wellbutrin, Ativan if needed, and oxycodone if needed for pain   Do not wear jewelry,  Do not wear lotions, powders, or perfumes. You may wear deodorant.             Men may shave face and neck.  Do not bring valuables to the hospital.  [facility] is not responsible for any belongings or valuables.               Contacts, dentures or bridgework may not be worn into surgery.  Leave suitcase in the car. After surgery it may be brought to your room.  For patients admitted to the hospital, discharge time is determined by your treatment team.               Patients discharged the day of surgery will not be allowed to drive home.    Special Instructions: Incentive Spirometry - Practice and bring it with you on the day of surgery. Shower using CHG 2 nights before surgery and the night before surgery.  If you shower the day of surgery use CHG.  Use special wash - you have one bottle of CHG for all showers.  You should use approximately 1/3 of the bottle for each shower.   Please read over the following fact sheets that you were given: Pain Booklet, Coughing and Deep Breathing, Blood Transfusion Information, MRSA Information and Surgical Site Infection Prevention

## 2013-06-03 NOTE — Progress Notes (Signed)
Anesthesia Chart Review:  Patient is a 58 year old male scheduled for right VATS, wedge resection, possible segmentectomy on 06/06/13 by Dr. Roxan Hockey.  History includes former smoker, tongue cancer ([facility]) s/p resection (09/2012) and chemoradiation completed 12/2012, CAD with STEMI 09/2006 s/p 4 Taxus stents to the proximal LAD with recurrent MI in 06/2007 due to stent thrombosis s/p PTCA LAD, s/p DES mid CX 02/2011, ischemic cardiomyopathy, HTN, GERD, HLD, OSA, anxiety, depression, COPD. PCP is Dr. Scarlette Calico.  Cardiologist is Dr. Aundra Dubin who had cleared him prior his oral cancer surgery 07/2012. He denied any new CV symptoms at his last visit with Dr. Roxan Hockey.    EKG on 05/23/13 showed SR with marked SA, septal infarct (age undetermined).  No significant change since last tracing.  Echo on 08/20/11 showed: - Left ventricle: Septal and apical hypokinesis The cavity size was normal. Wall thickness was normal. Systolic function was normal. The estimated ejection fraction was in the range of 50% to 55%. - Aortic valve: Mild regurgitation. - Mitral valve: Trivial regurgitation. - Left atrium: The atrium was mildly dilated. - Atrial septum: No defect or patent foramen ovale was Identified. - Pulmonic valve: Trivial regurgitation. - Tricuspid valve: Mild regurgitation.  Nuclear stress test on 09/02/11 showed: Abnormal stress nuclear study with a large, severe, fixed anteroseptal and apical defect consistent with prior infarct; no ischemia.  LV Ejection Fraction: 42%. LV Wall Motion: Distal anteroseptal and apical akinesis. Defect felt consistent with prior MI.  Continued medical therapy was recommended.  Holter monitor in 01/2012 showed NSR with occasional ST.  Cardiac cath on 02/04/11 showed: 1. Single-vessel obstructive coronary artery disease involving the mid left circumflex coronary artery.  2. Continued patency of stents in the proximal to mid LAD.  3. Severe left ventricular dysfunction.  4.  Successful intracoronary stenting of the mid left circumflex coronary artery with drug-eluting stents.  Pulmonary Function tests 05/09/13: FVC 3.68 (90%)  FEV1 2.81 (90%)  FEV1/FVC 76%  DLCO 104%  Preoperative labs noted.  He is for a CXR on arrival.  Patient had cardiac function testing within the last two years, has seen cardiology within the past year and has since tolerated oral surgery. EKG and symptomology has been stable. If no acute changed then I would anticipate that he could proceed as planned.  [facility] Short Stay Center/Anesthesiology Phone [phone] 06/03/2013 9:46 AM

## 2013-06-05 ENCOUNTER — Encounter (HOSPITAL_COMMUNITY): Payer: Self-pay | Admitting: Certified Registered Nurse Anesthetist

## 2013-06-05 MED ORDER — DEXTROSE 5 % IV SOLN
1.5000 g | INTRAVENOUS | Status: AC
  Administered 2013-06-06: 1.5 g via INTRAVENOUS
  Filled 2013-06-05: qty 1.5

## 2013-06-06 ENCOUNTER — Encounter (HOSPITAL_COMMUNITY): Payer: Self-pay | Admitting: *Deleted

## 2013-06-06 ENCOUNTER — Inpatient Hospital Stay (HOSPITAL_COMMUNITY): Payer: Medicaid Other | Admitting: Certified Registered Nurse Anesthetist

## 2013-06-06 ENCOUNTER — Inpatient Hospital Stay (HOSPITAL_COMMUNITY): Payer: Medicaid Other

## 2013-06-06 ENCOUNTER — Encounter (HOSPITAL_COMMUNITY)
Admission: RE | Disposition: A | Discharge status: Home / Self Care | Payer: Self-pay | Source: Ambulatory Visit | Attending: Thoracic Surgery (Cardiothoracic Vascular Surgery)

## 2013-06-06 ENCOUNTER — Encounter (HOSPITAL_COMMUNITY): Payer: Medicaid Other | Admitting: Vascular Surgery

## 2013-06-06 ENCOUNTER — Inpatient Hospital Stay (HOSPITAL_COMMUNITY)
Admission: RE | Admit: 2013-06-06 | Discharge: 2013-06-12 | DRG: 164 | Disposition: A | Discharge status: Home / Self Care | Payer: Medicaid Other | Source: Ambulatory Visit | Attending: Thoracic Surgery (Cardiothoracic Vascular Surgery) | Admitting: Thoracic Surgery (Cardiothoracic Vascular Surgery)

## 2013-06-06 DIAGNOSIS — F411 Generalized anxiety disorder: Secondary | ICD-10-CM | POA: Diagnosis present

## 2013-06-06 DIAGNOSIS — J449 Chronic obstructive pulmonary disease, unspecified: Secondary | ICD-10-CM | POA: Diagnosis present

## 2013-06-06 DIAGNOSIS — Z85810 Personal history of malignant neoplasm of tongue: Secondary | ICD-10-CM

## 2013-06-06 DIAGNOSIS — I10 Essential (primary) hypertension: Secondary | ICD-10-CM | POA: Diagnosis present

## 2013-06-06 DIAGNOSIS — D696 Thrombocytopenia, unspecified: Secondary | ICD-10-CM | POA: Diagnosis present

## 2013-06-06 DIAGNOSIS — Z7982 Long term (current) use of aspirin: Secondary | ICD-10-CM | POA: Diagnosis not present

## 2013-06-06 DIAGNOSIS — E871 Hypo-osmolality and hyponatremia: Secondary | ICD-10-CM | POA: Diagnosis present

## 2013-06-06 DIAGNOSIS — Z79899 Other long term (current) drug therapy: Secondary | ICD-10-CM

## 2013-06-06 DIAGNOSIS — F3289 Other specified depressive episodes: Secondary | ICD-10-CM | POA: Diagnosis present

## 2013-06-06 DIAGNOSIS — E785 Hyperlipidemia, unspecified: Secondary | ICD-10-CM | POA: Diagnosis present

## 2013-06-06 DIAGNOSIS — I252 Old myocardial infarction: Secondary | ICD-10-CM

## 2013-06-06 DIAGNOSIS — K5900 Constipation, unspecified: Secondary | ICD-10-CM | POA: Diagnosis present

## 2013-06-06 DIAGNOSIS — Z23 Encounter for immunization: Secondary | ICD-10-CM

## 2013-06-06 DIAGNOSIS — Z923 Personal history of irradiation: Secondary | ICD-10-CM

## 2013-06-06 DIAGNOSIS — Z87891 Personal history of nicotine dependence: Secondary | ICD-10-CM | POA: Diagnosis not present

## 2013-06-06 DIAGNOSIS — R918 Other nonspecific abnormal finding of lung field: Secondary | ICD-10-CM | POA: Diagnosis present

## 2013-06-06 DIAGNOSIS — I959 Hypotension, unspecified: Secondary | ICD-10-CM | POA: Diagnosis present

## 2013-06-06 DIAGNOSIS — K219 Gastro-esophageal reflux disease without esophagitis: Secondary | ICD-10-CM | POA: Diagnosis present

## 2013-06-06 DIAGNOSIS — G4730 Sleep apnea, unspecified: Secondary | ICD-10-CM | POA: Diagnosis present

## 2013-06-06 DIAGNOSIS — G8929 Other chronic pain: Secondary | ICD-10-CM | POA: Diagnosis present

## 2013-06-06 DIAGNOSIS — I2589 Other forms of chronic ischemic heart disease: Secondary | ICD-10-CM | POA: Diagnosis present

## 2013-06-06 DIAGNOSIS — D62 Acute posthemorrhagic anemia: Secondary | ICD-10-CM | POA: Diagnosis not present

## 2013-06-06 DIAGNOSIS — I2510 Atherosclerotic heart disease of native coronary artery without angina pectoris: Secondary | ICD-10-CM | POA: Diagnosis present

## 2013-06-06 DIAGNOSIS — F329 Major depressive disorder, single episode, unspecified: Secondary | ICD-10-CM | POA: Diagnosis present

## 2013-06-06 DIAGNOSIS — Z9861 Coronary angioplasty status: Secondary | ICD-10-CM

## 2013-06-06 DIAGNOSIS — R911 Solitary pulmonary nodule: Secondary | ICD-10-CM | POA: Diagnosis present

## 2013-06-06 DIAGNOSIS — C3430 Malignant neoplasm of lower lobe, unspecified bronchus or lung: Secondary | ICD-10-CM | POA: Diagnosis present

## 2013-06-06 DIAGNOSIS — D381 Neoplasm of uncertain behavior of trachea, bronchus and lung: Secondary | ICD-10-CM

## 2013-06-06 DIAGNOSIS — J4489 Other specified chronic obstructive pulmonary disease: Secondary | ICD-10-CM | POA: Diagnosis present

## 2013-06-06 HISTORY — PX: LOBECTOMY: SHX5089

## 2013-06-06 HISTORY — PX: VIDEO ASSISTED THORACOSCOPY (VATS)/WEDGE RESECTION: SHX6174

## 2013-06-06 SURGERY — VIDEO ASSISTED THORACOSCOPY (VATS)/WEDGE RESECTION
Anesthesia: General | Site: Chest | Laterality: Right

## 2013-06-06 MED ORDER — ONDANSETRON HCL 4 MG/2ML IJ SOLN: 4.0000 mg | Freq: Four times a day (QID) | INTRAMUSCULAR | Status: DC | PRN

## 2013-06-06 MED ORDER — FENTANYL CITRATE 0.05 MG/ML IJ SOLN
INTRAMUSCULAR | Status: AC
  Filled 2013-06-06: qty 2

## 2013-06-06 MED ORDER — PROPOFOL 10 MG/ML IV BOLUS
INTRAVENOUS | Status: AC
  Filled 2013-06-06: qty 20

## 2013-06-06 MED ORDER — NEOSTIGMINE METHYLSULFATE 1 MG/ML IJ SOLN
INTRAMUSCULAR | Status: DC | PRN
  Administered 2013-06-06: 4 mg via INTRAVENOUS

## 2013-06-06 MED ORDER — PROMETHAZINE HCL 25 MG/ML IJ SOLN: 6.2500 mg | INTRAMUSCULAR | Status: DC | PRN

## 2013-06-06 MED ORDER — ONDANSETRON HCL 4 MG/2ML IJ SOLN
4.0000 mg | Freq: Four times a day (QID) | INTRAMUSCULAR | Status: DC | PRN
  Administered 2013-06-07 – 2013-06-09 (×2): 4 mg via INTRAVENOUS
  Filled 2013-06-06 (×3): qty 2

## 2013-06-06 MED ORDER — ACETAMINOPHEN 500 MG PO TABS
1000.0000 mg | ORAL_TABLET | Freq: Four times a day (QID) | ORAL | Status: AC
  Administered 2013-06-06 – 2013-06-07 (×3): 1000 mg via ORAL
  Filled 2013-06-06 (×3): qty 2

## 2013-06-06 MED ORDER — PANTOPRAZOLE SODIUM 40 MG PO TBEC
40.0000 mg | DELAYED_RELEASE_TABLET | Freq: Every day | ORAL | Status: DC
  Administered 2013-06-07 – 2013-06-12 (×6): 40 mg via ORAL
  Filled 2013-06-06 (×6): qty 1

## 2013-06-06 MED ORDER — MIDAZOLAM HCL 2 MG/2ML IJ SOLN
INTRAMUSCULAR | Status: AC
  Administered 2013-06-06: 2 mg
  Filled 2013-06-06: qty 2

## 2013-06-06 MED ORDER — MIDAZOLAM HCL 2 MG/2ML IJ SOLN
INTRAMUSCULAR | Status: AC
  Filled 2013-06-06: qty 2

## 2013-06-06 MED ORDER — BISACODYL 5 MG PO TBEC
10.0000 mg | DELAYED_RELEASE_TABLET | Freq: Every day | ORAL | Status: DC
Start: 2013-06-07 — End: 2013-06-12
  Administered 2013-06-07 – 2013-06-12 (×5): 10 mg via ORAL
  Filled 2013-06-06 (×5): qty 2

## 2013-06-06 MED ORDER — GLYCOPYRROLATE 0.2 MG/ML IJ SOLN
INTRAMUSCULAR | Status: AC
  Filled 2013-06-06: qty 3

## 2013-06-06 MED ORDER — NALOXONE HCL 0.4 MG/ML IJ SOLN
0.4000 mg | INTRAMUSCULAR | Status: DC | PRN
  Filled 2013-06-06: qty 1

## 2013-06-06 MED ORDER — DIPHENHYDRAMINE HCL 50 MG/ML IJ SOLN
12.5000 mg | Freq: Four times a day (QID) | INTRAMUSCULAR | Status: DC | PRN
  Filled 2013-06-06: qty 0.25

## 2013-06-06 MED ORDER — NEOSTIGMINE METHYLSULFATE 1 MG/ML IJ SOLN
INTRAMUSCULAR | Status: AC
  Filled 2013-06-06: qty 10

## 2013-06-06 MED ORDER — TRAZODONE HCL 50 MG PO TABS
50.0000 mg | ORAL_TABLET | Freq: Every day | ORAL | Status: DC
  Administered 2013-06-06 – 2013-06-11 (×6): 50 mg via ORAL
  Filled 2013-06-06 (×7): qty 1

## 2013-06-06 MED ORDER — OXYCODONE-ACETAMINOPHEN 5-325 MG PO TABS
1.0000 | ORAL_TABLET | ORAL | Status: DC | PRN
  Administered 2013-06-07 – 2013-06-11 (×6): 2 via ORAL
  Filled 2013-06-06 (×6): qty 2

## 2013-06-06 MED ORDER — SODIUM CHLORIDE 0.9 % IJ SOLN: 9.0000 mL | INTRAMUSCULAR | Status: DC | PRN

## 2013-06-06 MED ORDER — BISOPROLOL FUMARATE 10 MG PO TABS
15.0000 mg | ORAL_TABLET | Freq: Every day | ORAL | Status: DC
  Administered 2013-06-08: 15 mg via ORAL
  Filled 2013-06-06 (×3): qty 1

## 2013-06-06 MED ORDER — ONDANSETRON HCL 4 MG/2ML IJ SOLN
INTRAMUSCULAR | Status: DC | PRN
  Administered 2013-06-06: 4 mg via INTRAVENOUS

## 2013-06-06 MED ORDER — MULTI-VITAMIN/MINERALS PO TABS: 1.0000 | ORAL_TABLET | Freq: Every day | ORAL | Status: DC

## 2013-06-06 MED ORDER — ASPIRIN 325 MG PO TABS
325.0000 mg | ORAL_TABLET | Freq: Every day | ORAL | Status: DC
  Administered 2013-06-07 – 2013-06-12 (×6): 325 mg via ORAL
  Filled 2013-06-06 (×6): qty 1

## 2013-06-06 MED ORDER — PROPOFOL 10 MG/ML IV BOLUS
INTRAVENOUS | Status: DC | PRN
  Administered 2013-06-06: 20 mg via INTRAVENOUS
  Administered 2013-06-06: 100 mg via INTRAVENOUS

## 2013-06-06 MED ORDER — GLYCOPYRROLATE 0.2 MG/ML IJ SOLN
INTRAMUSCULAR | Status: DC | PRN
  Administered 2013-06-06: .6 mg via INTRAVENOUS

## 2013-06-06 MED ORDER — 0.9 % SODIUM CHLORIDE (POUR BTL) OPTIME
TOPICAL | Status: DC | PRN
  Administered 2013-06-06: 1000 mL

## 2013-06-06 MED ORDER — FENTANYL CITRATE 0.05 MG/ML IJ SOLN
INTRAMUSCULAR | Status: AC
  Administered 2013-06-06: 50 ug via INTRAVENOUS
  Filled 2013-06-06: qty 4

## 2013-06-06 MED ORDER — ALBUMIN HUMAN 5 % IV SOLN
INTRAVENOUS | Status: DC | PRN
  Administered 2013-06-06: 16:00:00 via INTRAVENOUS

## 2013-06-06 MED ORDER — POTASSIUM CHLORIDE 10 MEQ/50ML IV SOLN
10.0000 meq | Freq: Every day | INTRAVENOUS | Status: DC | PRN
Start: 2013-06-06 — End: 2013-06-12

## 2013-06-06 MED ORDER — ACETAMINOPHEN 160 MG/5ML PO SOLN: 1000.0000 mg | Freq: Four times a day (QID) | ORAL | Status: AC

## 2013-06-06 MED ORDER — DIPHENHYDRAMINE HCL 12.5 MG/5ML PO ELIX
12.5000 mg | ORAL_SOLUTION | Freq: Four times a day (QID) | ORAL | Status: DC | PRN
  Filled 2013-06-06: qty 5

## 2013-06-06 MED ORDER — PHENYLEPHRINE HCL 10 MG/ML IJ SOLN
10.0000 mg | INTRAVENOUS | Status: DC | PRN
  Administered 2013-06-06: 20 ug/min via INTRAVENOUS

## 2013-06-06 MED ORDER — ROCURONIUM BROMIDE 50 MG/5ML IV SOLN
INTRAVENOUS | Status: AC
  Filled 2013-06-06: qty 1

## 2013-06-06 MED ORDER — FENTANYL CITRATE 0.05 MG/ML IJ SOLN
INTRAMUSCULAR | Status: AC
  Filled 2013-06-06: qty 5

## 2013-06-06 MED ORDER — DEXTROSE 5 % IV SOLN
1.5000 g | Freq: Two times a day (BID) | INTRAVENOUS | Status: AC
  Administered 2013-06-06 – 2013-06-07 (×2): 1.5 g via INTRAVENOUS
  Filled 2013-06-06 (×3): qty 1.5

## 2013-06-06 MED ORDER — BISOPROLOL FUMARATE 5 MG PO TABS
15.0000 mg | ORAL_TABLET | Freq: Once | ORAL | Status: AC
  Administered 2013-06-06: 15 mg via ORAL
  Filled 2013-06-06: qty 1

## 2013-06-06 MED ORDER — PHENYLEPHRINE HCL 10 MG/ML IJ SOLN
INTRAMUSCULAR | Status: DC | PRN
  Administered 2013-06-06 (×5): 80 ug via INTRAVENOUS

## 2013-06-06 MED ORDER — LIDOCAINE HCL (CARDIAC) 20 MG/ML IV SOLN
INTRAVENOUS | Status: AC
  Filled 2013-06-06: qty 5

## 2013-06-06 MED ORDER — LACTATED RINGERS IV SOLN
INTRAVENOUS | Status: DC
  Administered 2013-06-06 (×4): via INTRAVENOUS

## 2013-06-06 MED ORDER — MEPERIDINE HCL 25 MG/ML IJ SOLN: 6.2500 mg | INTRAMUSCULAR | Status: DC | PRN

## 2013-06-06 MED ORDER — EPHEDRINE SULFATE 50 MG/ML IJ SOLN
INTRAMUSCULAR | Status: DC | PRN
  Administered 2013-06-06: 10 mg via INTRAVENOUS

## 2013-06-06 MED ORDER — FENTANYL CITRATE 0.05 MG/ML IJ SOLN
INTRAMUSCULAR | Status: DC | PRN
  Administered 2013-06-06 (×4): 50 ug via INTRAVENOUS
  Administered 2013-06-06: 200 ug via INTRAVENOUS

## 2013-06-06 MED ORDER — FENTANYL CITRATE 0.05 MG/ML IJ SOLN
25.0000 ug | INTRAMUSCULAR | Status: DC | PRN
  Administered 2013-06-06 (×4): 50 ug via INTRAVENOUS

## 2013-06-06 MED ORDER — BUPIVACAINE 0.5 % ON-Q PUMP SINGLE CATH 400 ML
400.0000 mL | INJECTION | Status: DC
  Filled 2013-06-06: qty 400

## 2013-06-06 MED ORDER — PNEUMOCOCCAL VAC POLYVALENT 25 MCG/0.5ML IJ INJ
0.5000 mL | INJECTION | INTRAMUSCULAR | Status: AC
  Administered 2013-06-11: 0.5 mL via INTRAMUSCULAR
  Filled 2013-06-06 (×2): qty 0.5

## 2013-06-06 MED ORDER — ATORVASTATIN CALCIUM 80 MG PO TABS
80.0000 mg | ORAL_TABLET | Freq: Every day | ORAL | Status: DC
  Administered 2013-06-06 – 2013-06-12 (×7): 80 mg via ORAL
  Filled 2013-06-06 (×7): qty 1

## 2013-06-06 MED ORDER — LIDOCAINE HCL (CARDIAC) 20 MG/ML IV SOLN
INTRAVENOUS | Status: DC | PRN
  Administered 2013-06-06: 25 mg via INTRAVENOUS

## 2013-06-06 MED ORDER — BUPROPION HCL ER (SR) 150 MG PO TB12
150.0000 mg | ORAL_TABLET | Freq: Two times a day (BID) | ORAL | Status: DC
  Administered 2013-06-06 – 2013-06-12 (×12): 150 mg via ORAL
  Filled 2013-06-06 (×13): qty 1

## 2013-06-06 MED ORDER — DEXTROSE-NACL 5-0.9 % IV SOLN
INTRAVENOUS | Status: DC
  Administered 2013-06-06: 21:00:00 via INTRAVENOUS

## 2013-06-06 MED ORDER — ROCURONIUM BROMIDE 100 MG/10ML IV SOLN
INTRAVENOUS | Status: DC | PRN
  Administered 2013-06-06: 25 mg via INTRAVENOUS
  Administered 2013-06-06: 15 mg via INTRAVENOUS
  Administered 2013-06-06: 10 mg via INTRAVENOUS
  Administered 2013-06-06: 50 mg via INTRAVENOUS

## 2013-06-06 MED ORDER — SENNOSIDES-DOCUSATE SODIUM 8.6-50 MG PO TABS
1.0000 | ORAL_TABLET | Freq: Every evening | ORAL | Status: DC | PRN
  Filled 2013-06-06: qty 1

## 2013-06-06 MED ORDER — INFLUENZA VAC SPLIT QUAD 0.5 ML IM SUSP
0.5000 mL | INTRAMUSCULAR | Status: AC
Start: 2013-06-07 — End: 2013-06-11
  Administered 2013-06-11: 0.5 mL via INTRAMUSCULAR
  Filled 2013-06-06 (×2): qty 0.5

## 2013-06-06 MED ORDER — ONDANSETRON HCL 4 MG/2ML IJ SOLN
INTRAMUSCULAR | Status: AC
  Filled 2013-06-06: qty 2

## 2013-06-06 MED ORDER — ADULT MULTIVITAMIN W/MINERALS CH
1.0000 | ORAL_TABLET | Freq: Every day | ORAL | Status: DC
  Administered 2013-06-07 – 2013-06-12 (×6): 1 via ORAL
  Filled 2013-06-06 (×6): qty 1

## 2013-06-06 MED ORDER — FENTANYL 10 MCG/ML IV SOLN
INTRAVENOUS | Status: AC
  Administered 2013-06-06: 150 ug via INTRAVENOUS
  Administered 2013-06-06: 19:00:00 via INTRAVENOUS
  Administered 2013-06-06: 30 ug via INTRAVENOUS
  Administered 2013-06-07: 90 ug via INTRAVENOUS
  Administered 2013-06-07: 180 ug via INTRAVENOUS
  Administered 2013-06-07: 150 ug via INTRAVENOUS
  Administered 2013-06-07: 38 ug via INTRAVENOUS
  Administered 2013-06-07 (×2): 75 ug via INTRAVENOUS
  Administered 2013-06-07: 165 ug via INTRAVENOUS
  Administered 2013-06-08: 75 ug via INTRAVENOUS
  Administered 2013-06-08: 60 ug via INTRAVENOUS
  Administered 2013-06-08: 45 ug via INTRAVENOUS
  Administered 2013-06-08: 90 ug via INTRAVENOUS
  Administered 2013-06-08: 59.2 ug via INTRAVENOUS
  Administered 2013-06-08: 105 ug via INTRAVENOUS
  Administered 2013-06-09: 45 ug via INTRAVENOUS
  Administered 2013-06-09: 30 ug via INTRAVENOUS
  Administered 2013-06-09: 15 ug via INTRAVENOUS
  Administered 2013-06-09: 71.22 ug via INTRAVENOUS
  Administered 2013-06-09: 15 ug via INTRAVENOUS
  Administered 2013-06-09: 90 ug via INTRAVENOUS
  Administered 2013-06-10: 15 ug via INTRAVENOUS
  Administered 2013-06-10: 45 ug via INTRAVENOUS
  Administered 2013-06-10: 30 ug via INTRAVENOUS
  Filled 2013-06-06 (×5): qty 50

## 2013-06-06 MED ORDER — ALBUTEROL SULFATE HFA 108 (90 BASE) MCG/ACT IN AERS
INHALATION_SPRAY | RESPIRATORY_TRACT | Status: DC | PRN
  Administered 2013-06-06 (×2): 4 via RESPIRATORY_TRACT

## 2013-06-06 MED ORDER — SUCCINYLCHOLINE CHLORIDE 20 MG/ML IJ SOLN
INTRAMUSCULAR | Status: DC | PRN
  Administered 2013-06-06: 140 mg via INTRAVENOUS

## 2013-06-06 SURGICAL SUPPLY — 89 items
ADH SKN CLS APL DERMABOND .7 (GAUZE/BANDAGES/DRESSINGS) ×2
APL SKNCLS STERI-STRIP NONHPOA (GAUZE/BANDAGES/DRESSINGS) ×2
APPLIER CLIP 5 13 M/L LIGAMAX5 (MISCELLANEOUS) ×4
APR CLP MED LRG 5 ANG JAW (MISCELLANEOUS) ×2
BAG SPEC RTRVL LRG 6X4 10 (ENDOMECHANICALS) ×2
BENZOIN TINCTURE PRP APPL 2/3 (GAUZE/BANDAGES/DRESSINGS) ×4 IMPLANT
BLADE SURG 11 STRL SS (BLADE) ×3 IMPLANT
CANISTER SUCTION 2500CC (MISCELLANEOUS) ×8 IMPLANT
CATH KIT ON Q 5IN SLV (PAIN MANAGEMENT) ×3 IMPLANT
CATH THORACIC 28FR (CATHETERS) IMPLANT
CATH THORACIC 36FR (CATHETERS) IMPLANT
CATH THORACIC 36FR RT ANG (CATHETERS) IMPLANT
CLIP APPLIE 5 13 M/L LIGAMAX5 (MISCELLANEOUS) ×1 IMPLANT
CLIP TI MEDIUM 6 (CLIP) ×4 IMPLANT
CONN ST 1/4X3/8  BEN (MISCELLANEOUS) ×2
CONN ST 1/4X3/8 BEN (MISCELLANEOUS) ×1 IMPLANT
CONN Y 3/8X3/8X3/8  BEN (MISCELLANEOUS) ×2
CONN Y 3/8X3/8X3/8 BEN (MISCELLANEOUS) ×1 IMPLANT
CONT SPEC 4OZ CLIKSEAL STRL BL (MISCELLANEOUS) ×26 IMPLANT
COVER SURGICAL LIGHT HANDLE (MISCELLANEOUS) ×4 IMPLANT
DERMABOND ADVANCED (GAUZE/BANDAGES/DRESSINGS) ×2
DERMABOND ADVANCED .7 DNX12 (GAUZE/BANDAGES/DRESSINGS) ×1 IMPLANT
DRAIN CHANNEL 28F RND 3/8 FF (WOUND CARE) ×3 IMPLANT
DRAIN CHANNEL 32F RND 10.7 FF (WOUND CARE) ×3 IMPLANT
DRAPE LAPAROSCOPIC ABDOMINAL (DRAPES) ×4 IMPLANT
DRAPE WARM FLUID 44X44 (DRAPE) ×4 IMPLANT
ELECT REM PT RETURN 9FT ADLT (ELECTROSURGICAL) ×4
ELECTRODE REM PT RTRN 9FT ADLT (ELECTROSURGICAL) ×2 IMPLANT
GLOVE SURG SIGNA 7.5 PF LTX (GLOVE) ×8 IMPLANT
GOWN PREVENTION PLUS XLARGE (GOWN DISPOSABLE) ×8 IMPLANT
GOWN STRL NON-REIN LRG LVL3 (GOWN DISPOSABLE) ×8 IMPLANT
HANDLE STAPLE ENDO GIA SHORT (STAPLE) ×2
HEMOSTAT SURGICEL 2X14 (HEMOSTASIS) IMPLANT
KIT BASIN OR (CUSTOM PROCEDURE TRAY) ×4 IMPLANT
KIT ROOM TURNOVER OR (KITS) ×4 IMPLANT
KIT SUCTION CATH 14FR (SUCTIONS) ×4 IMPLANT
NS IRRIG 1000ML POUR BTL (IV SOLUTION) ×8 IMPLANT
PACK CHEST (CUSTOM PROCEDURE TRAY) ×4 IMPLANT
PAD ARMBOARD 7.5X6 YLW CONV (MISCELLANEOUS) ×8 IMPLANT
PAIN PUMP ON-Q 400MLX5ML 5IN (MISCELLANEOUS) ×3 IMPLANT
POUCH ENDO CATCH II 15MM (MISCELLANEOUS) ×3 IMPLANT
POUCH SPECIMEN RETRIEVAL 10MM (ENDOMECHANICALS) ×3 IMPLANT
RELOAD EGIA 45 MED/THCK PURPLE (STAPLE) ×21 IMPLANT
RELOAD EGIA 45 TAN VASC (STAPLE) ×6 IMPLANT
RELOAD EGIA 60 MED/THCK PURPLE (STAPLE) ×28 IMPLANT
RELOAD EGIA TRIS TAN 45 CVD (STAPLE) ×4 IMPLANT
RELOAD STAPLE 45 TAN MED CVD (STAPLE) IMPLANT
RELOAD STAPLE 60 MED/THCK ART (STAPLE) IMPLANT
SEALANT PROGEL (MISCELLANEOUS) ×3 IMPLANT
SEALANT SURG COSEAL 4ML (VASCULAR PRODUCTS) IMPLANT
SEALANT SURG COSEAL 8ML (VASCULAR PRODUCTS) IMPLANT
SOLUTION ANTI FOG 6CC (MISCELLANEOUS) ×7 IMPLANT
SPECIMEN JAR MEDIUM (MISCELLANEOUS) ×1 IMPLANT
SPONGE GAUZE 4X4 12PLY (GAUZE/BANDAGES/DRESSINGS) ×4 IMPLANT
SPONGE GAUZE 4X4 12PLY STER LF (GAUZE/BANDAGES/DRESSINGS) ×3 IMPLANT
STAPLER ENDO GIA 12 SHRT THIN (STAPLE) IMPLANT
STAPLER ENDO GIA 12MM SHORT (STAPLE) ×2 IMPLANT
SUT PROLENE 4 0 RB 1 (SUTURE) ×4
SUT PROLENE 4 0 SH DA (SUTURE) ×3 IMPLANT
SUT PROLENE 4-0 RB1 .5 CRCL 36 (SUTURE) ×1 IMPLANT
SUT SILK  1 MH (SUTURE) ×2
SUT SILK 1 MH (SUTURE) ×2 IMPLANT
SUT SILK 2 0SH CR/8 30 (SUTURE) IMPLANT
SUT SILK 3 0 SH CR/8 (SUTURE) ×3 IMPLANT
SUT SILK 3 0SH CR/8 30 (SUTURE) IMPLANT
SUT VIC AB 0 CTX 27 (SUTURE) IMPLANT
SUT VIC AB 1 CTX 27 (SUTURE) ×3 IMPLANT
SUT VIC AB 2-0 CT1 27 (SUTURE) ×4
SUT VIC AB 2-0 CT1 TAPERPNT 27 (SUTURE) ×1 IMPLANT
SUT VIC AB 2-0 CTX 36 (SUTURE) IMPLANT
SUT VIC AB 3-0 MH 27 (SUTURE) IMPLANT
SUT VIC AB 3-0 SH 27 (SUTURE)
SUT VIC AB 3-0 SH 27X BRD (SUTURE) IMPLANT
SUT VIC AB 3-0 X1 27 (SUTURE) ×11 IMPLANT
SUT VICRYL 0 UR6 27IN ABS (SUTURE) ×8 IMPLANT
SUT VICRYL 2 TP 1 (SUTURE) IMPLANT
SWAB COLLECTION DEVICE MRSA (MISCELLANEOUS) IMPLANT
SYSTEM SAHARA CHEST DRAIN ATS (WOUND CARE) ×4 IMPLANT
TAPE CLOTH SURG 4X10 WHT LF (GAUZE/BANDAGES/DRESSINGS) ×3 IMPLANT
TIP APPLICATOR SPRAY EXTEND 16 (VASCULAR PRODUCTS) ×3 IMPLANT
TOWEL OR 17X24 6PK STRL BLUE (TOWEL DISPOSABLE) ×4 IMPLANT
TOWEL OR 17X26 10 PK STRL BLUE (TOWEL DISPOSABLE) ×8 IMPLANT
TRAP SPECIMEN MUCOUS 40CC (MISCELLANEOUS) IMPLANT
TRAY FOLEY CATH 16FRSI W/METER (SET/KITS/TRAYS/PACK) ×4 IMPLANT
TROCAR XCEL BLADELESS 5X75MML (TROCAR) ×4 IMPLANT
TUBE ANAEROBIC SPECIMEN COL (MISCELLANEOUS) IMPLANT
TUNNELER SHEATH ON-Q 11GX8 (MISCELLANEOUS) ×3 IMPLANT
TUNNELER SHEATH ON-Q 11GX8 DSP (PAIN MANAGEMENT) IMPLANT
WATER STERILE IRR 1000ML POUR (IV SOLUTION) ×8 IMPLANT

## 2013-06-06 NOTE — Transfer of Care (Signed)
Immediate Anesthesia Transfer of Care Note  Patient: Trevor Griffin [facility]  Procedure(s) Performed: Procedure(s): VIDEO ASSISTED THORACOSCOPY (VATS)/WEDGE RESECTION (Right) LOBECTOMY (Right)  Patient Location: PACU  Anesthesia Type:General  Level of Consciousness: awake and alert   Airway & Oxygen Therapy: Patient Spontanous Breathing and Patient connected to nasal cannula oxygen  Post-op Assessment: Report given to PACU RN, Post -op Vital signs reviewed and stable and Patient moving all extremities X 4  Post vital signs: Reviewed and stable  Complications: No apparent anesthesia complications

## 2013-06-06 NOTE — H&P (View-Only) (Signed)
HPI:  58 year old gentleman presents for evaluation of a right lung nodule.  Trevor Griffin is a 57 year old gentleman with an 80-pack-year history of smoking. Back in May he was diagnosed with squamous cell carcinoma of the tongue. This turned out to be a T3 N1, grade 2 lesion. He underwent resection and reconstruction at [facility]. He then was treated with 60 gray of radiation in 30 fractions by Trevor Griffin. Radiation was completed in August.  On his initial a CT of the chest earlier in the year he had a 4 mm right lower lobe nodule. A repeat CT in October the lesion had grown to 12 mm and was cavitary. A PET CT was done on November 6. By that time the lesion had increased in size again, although I think that this was in large part due to the methodology of the measurement. The lesion was hypermetabolic with malignant range uptake.  He saw Trevor Griffin to discuss possible SBRT of the right lower lobe lesion. Trevor Griffin wanted to obtain a biopsy before radiation if possible, but also wanted him evaluated as a possible surgical candidate. He had to cancel multiple appointments with me in November and December due to him and multiple family members having the flu.   He has been having difficulty swallowing since his tongue surgery. He has lost 32 pounds over the past 6 months and 18 pounds over the past 3 months. He currently is 147 pounds. His appetite is poor. He also complains of decreased energy. He does get short of breath with exertion and has a dry cough. He also complains of anxiety, depression, and nervousness. Also complains of chronic pain in his right arm and some limitation of motion in the right arm. He also has pain and limitation of motion in his right leg. He is able to ambulate without assistance.  He continues to smoke.  He does have an extensive cardiac history with 2 heart attacks in 2008 and 2009. In 2008 he had multiple stents placed for LAD occlusion. In 2009 he had a stent  thrombosis requiring re\re intervention. He also had additional stent placed in 2012. He was cleared for surgery back in June by the cardiologist at [facility] although he is followed by Trevor Griffin here in [place]. He did not have any cardiac issues with his head and neck surgery. He is not having any chest pain, pressure, or tightness.  Past Medical History  Diagnosis Date  . History of tobacco abuse     quit Oct '12  . Hyperlipidemia   . Ischemic cardiomyopathy     Echo 08/20/11 with EF 50-55% (previously 35%)  . Hypertension   . Obesity   . Depression   . CAD (coronary artery disease)     Ant STEMI 5/08 with 4 Taxus DES proximal to mid LAD. 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 Cath in 2/09 also showed 80% mid to distal CFX stenosis. Myoview (6/11): EF 37%, large anteroapical infarct with no ischemia. [place] 10/12: LHC with prox to mid LAD stent ok with 10-20% ISR, mLAD 40%, CFX 30-40%, mCFX 90%, EF 35% - DES to mCFX  . GERD (gastroesophageal reflux disease)   . COPD (chronic obstructive pulmonary disease)   . Alcohol abuse   . Fatigue     sleep test neg 2008  . Anterior myocardial infarction 2008  . Palpitations     For event monitor 01/2012  . Tongue  cancer   . Hx of radiation therapy 10/25/12-12/10/12     Right Tongue/ Bilateral Neck //Total Dose, 60 Gy in 30 Fractions     Current Outpatient Prescriptions  Medication Sig Dispense Refill  . aspirin 325 MG tablet Take 325 mg by mouth daily.      Marland Kitchen atorvastatin (LIPITOR) 80 MG tablet Take 80 mg by mouth daily.        . bisoprolol (ZEBETA) 10 MG tablet Take 15 mg by mouth daily. Takes 1.5 tablet      . Blood Pressure Monitoring (RA BLOOD PRESSURE CUFF MONITOR) MISC 1 Device. 1 Device by Misc.(Non-Drug; Combo Route) route as needed.      Marland Kitchen buPROPion (WELLBUTRIN SR) 150 MG 12 hr tablet Take 150 mg by mouth 2 (two) times daily.      Marland Kitchen lisinopril (PRINIVIL,ZESTRIL) 10 MG  tablet Take 10 mg by mouth daily.      Marland Kitchen LORazepam (ATIVAN) 2 MG tablet Take 1 tablet (2 mg total) by mouth every 8 (eight) hours as needed for anxiety.  45 tablet  0  . Multiple Vitamins-Minerals (MULTIVITAMIN WITH MINERALS) tablet Take 1 tablet by mouth daily.        . nitroGLYCERIN (NITROSTAT) 0.4 MG SL tablet Place 1 tablet (0.4 mg total) under the tongue every 5 (five) minutes as needed (up to 3 doses). For chest pain      . traMADol (ULTRAM) 50 MG tablet Take by mouth. Take 50 mg by mouth every 6 (six) hours as needed for pain.      Marland Kitchen oxyCODONE-acetaminophen (PERCOCET) 10-325 MG per tablet Take 1 tablet by mouth 2 (two) times daily as needed for pain.  60 tablet  0  . traZODone (DESYREL) 50 MG tablet Take 1 tablet (50 mg total) by mouth at bedtime.  90 tablet  3   No current facility-administered medications for this visit.   History   Social History  . Marital Status: Married    Spouse Name: N/A    Number of Children: N/A  . Years of Education: N/A   Occupational History  . Not on file.   Social History Main Topics  . Smoking status: Former Smoker -- 2.00 packs/day for 40 years    Types: Cigarettes    Quit date: 09/02/2012  . Smokeless tobacco: Not on file     Comment: Stopped smoking cigars day before surgery  . Alcohol Use: Yes     Comment: 8-12 BEERS DAILY prior to surgery.  2 beers daily at present time  . Drug Use: No  . Sexual Activity: Yes   Other Topics Concern  . Not on file   Social History Narrative   Alcoholic beverage:  Yes      Drug use:  No      Seatbead Use: Yes      Firearms in home:  Yes      Exercise:  No      Smoke Alarm in your home: Yes                    Physical Exam BP 124/82  Pulse 94  Resp 20  Ht 5\' 8"  (1.727 m)  Wt 147 lb (66.679 kg)  BMI 22.36 kg/m2  SpO66 61% 58 year old male in no acute distress HEENT deformity and well-healed surgical scars Neuro alert and oriented x3, 4/5 strength in right arm with limited  abduction Neck no palpable adenopathy Cardiac regular rate and rhythm normal S1 and S2 Lungs clear  bilaterally, no wheezes Abdomen soft nontender Extremities without edema  Diagnostic Tests:  PET/CT NUCLEAR MEDICINE PET SKULL BASE TO THIGH  FASTING BLOOD GLUCOSE: Value: 88mg /dl  TECHNIQUE:  17.8 mCi F-18 FDG was injected intravenously. CT data was obtained  and used for attenuation correction and anatomic localization only.  (This was not acquired as a diagnostic CT examination.) Additional  exam technical data entered on technologist worksheet.  COMPARISON: Chest CT 03/03/2013, CT chest, abdomen, pelvis  10/15/2012  FINDINGS:  NECK  No hypermetabolic lymph nodes in the neck. Minimally asymmetrically  prominent right tonsillar parapharyngeal uptake is identified  without measurable mass lesion, SUV maximum 7.8. Postsurgical and  presumed brachytherapy related changes are noted involving the  tongue. There is mild non masslike uptake within the tongue base,  SUV maximum 4.4.  Bilateral maxillary sinusitis.  CHEST  The cavitary right lower lobe pulmonary nodule demonstrates  malignant range FDG uptake, SUV maximum 3.4. It has enlarged in  size, now measuring 1.8 cm compared to 1.3 cm on the prior exam. No  other area of abnormal intrathoracic uptake is identified. Diffuse  non mass like mildly prominent FDG uptake is noted within the  esophagus which could be due to motion or esophagitis. Neoplasm  would be less likely but possible.  ABDOMEN/PELVIS  No abnormal hypermetabolic activity within the liver, pancreas,  adrenal glands, or spleen. No hypermetabolic lymph nodes in the  abdomen or pelvis.  SKELETON  No focal hypermetabolic activity to suggest skeletal metastasis.  IMPRESSION:  Malignant range FDG uptake within an enlarging right lower lobe  cavitary pulmonary nodule, highly suspicious for squamous cell  metastatic disease. Primary bronchogenic carcinoma could have a   similar appearance. Cavitary pneumonia or other infection/  inflammatory etiology is possible but less likely.  FDG uptake within the tongue, for which the differential  consideration includes malignant involvement given the history of  tongue cancer, motion artifact, or treatment effect.  Electronically Signed  By: Conchita Paris M.D.  On: 03/10/2013 14:10   CT chest 05/09/2013 CT CHEST WITHOUT CONTRAST  TECHNIQUE:  Multidetector CT imaging of the chest was performed following the  standard protocol without IV contrast.  COMPARISON: PET-CT on 03/10/2013 and chest CT on 03/03/2013  FINDINGS:  Further enlargement of cavitary pulmonary nodule is seen in the  posterior right lower lobe, which now measures 19 mm on image 50  compared to 13 mm previously. This is consistent with metastatic  squamous cell carcinoma. There is also increased size of on adjacent  less well-defined nodular opacity in the posterior right lung base  which may be due to adjacent atelectasis or pneumonitis, although  neoplasm cannot definitely be excluded. No other suspicious  pulmonary nodules or masses are seen in either lung.  No evidence of hilar or mediastinal lymphadenopathy. No evidence of  pleural or pericardial effusion. No evidence of pulmonary infiltrate  or central endobronchial obstruction. No evidence of chest wall mass  or suspicious bone lesions. Both adrenal glands are normal in  appearance. Cholelithiasis again noted.  IMPRESSION:  Further increase in size of cavitary pulmonary nodule in the  posterior right lower lobe now measuring 19 mm, with adjacent  ill-defined nodular opacity possibly due to associated  postobstructive atelectasis or pneumonitis. This is most consistent  with a pulmonary metastasis from squamous cell carcinoma.  No other sites of neoplasm identified within the thorax.  Cholelithiasis. No radiographic evidence of cholecystitis.  Electronically Signed  By: Earle Gell  M.D.  On: 05/09/2013 12:26  Pulmonary Function tests FVC 3.68 (90%) FEV1 2.81 (90%)  FEV1/FVC 76% DLCO 104%  Impression: 58 year old gentleman with a history of tobacco abuse who has a cavitary lung mass in the right lower lobe. He recently was treated for squamous cell carcinoma the tongue. This lesion could represent either a metastatic site from the tongue cancer or could be a second primary lung cancer. Either way I think his best chance for survival is with surgical resection. I would say that is a concern given his recent had neck surgery, particularly is concerned airway issues and double-lumen tube placement. We may have to do the procedure with a bronchial blocker.  I discussed the options of surgical resection versus biopsy followed by SB RT. We discussed the advantages and disadvantages of each approach. I do think his best chance for cure is for surgery, albeit at relatively high risk. By the same token there is less morbidity and mortality risk with radiation, but likely a lower survival rate long-term.  The planned procedure would be a right VATS, wedge resection, then possible segmentectomy or lobectomy if this turns out to be a primary lung cancer.  I discussed with the patient and his wife the general nature of the procedure, the intraoperative decision making, general anesthesia, the expected hospital stay and overall recovery. We discussed the indications, risks, benefits, and alternatives. They understand that the risks include, but are not limited to death, MI, DVT, PE, bleeding, possible need for transfusion, infection, including aspiration pneumonia, air leak, cardiac arrhythmias, airway complications, as well as the possibility of other unforeseeable complications.  He understands the risks and wishes to proceed.  He has run out of Percocet and asked for a refill. He takes that for right shoulder and arm pain and right leg pain that is followed his head and neck surgery  and radiation. I gave him a prescription for Percocet 5/325 one to 2 twice daily as needed for pain, 30 tablets, no refills.   Plan: Right VATS, wedge resection, possible segmentectomy or lobectomy on Thursday, January 22.

## 2013-06-06 NOTE — Anesthesia Postprocedure Evaluation (Signed)
Anesthesia Post Note  Patient: Trevor Griffin [facility]  Procedure(s) Performed: Procedure(s) (LRB): VIDEO ASSISTED THORACOSCOPY (VATS)/WEDGE RESECTION (Right) LOBECTOMY (Right)  Anesthesia type: General  Patient location: PACU  Post pain: Pain level controlled  Post assessment: Patient's Cardiovascular Status Stable  Last Vitals:  Filed Vitals:   06/06/13 1930  BP: 106/66  Pulse: 91  Temp: 36.7 C  Resp: 22    Post vital signs: Reviewed and stable  Level of consciousness: alert  Complications: No apparent anesthesia complications

## 2013-06-06 NOTE — Brief Op Note (Addendum)
06/06/2013  4:43 PM  PATIENT:  Trevor Griffin  58 y.o. male  PRE-OPERATIVE DIAGNOSIS:  RIGHT LUNG NODULE  POST-OPERATIVE DIAGNOSIS:  RIGHT LUNG NODULE  PROCEDURE:   RIGHT VIDEO ASSISTED THORACOSCOPY (VATS) RIGHT WEDGE RESECTION THORACOSCOPIC RIGHT LOWER LOBECTOMY LYMPH NODE DISSECTION  SURGEON:  Surgeon(s) and Role:    * Melrose Nakayama, MD - Primary  PHYSICIAN ASSISTANT: Lars Pinks PA-C  ANESTHESIA:   general  EBL:  Total I/O In: 1224 [I.V.:2000; IV Piggyback:250] Out: 825 [Urine:245; Blood:250]  BLOOD ADMINISTERED:none  DRAINS: 65 French straight chest tube and 32 Bard drain in the right pleural space   SPECIMEN:  Source of Specimen:  Wedge right lower lobe;RLL and multiple lymph nodes  DISPOSITION OF SPECIMEN:  PATHOLOGY. Frozen section NSCCancer  COUNTS CORRECT:  YES  PLAN OF CARE: Admit to inpatient   PATIENT DISPOSITION:  PACU - hemodynamically stable.   Delay start of Pharmacological VTE agent (>24hrs) due to surgical blood loss or risk of bleeding: yes

## 2013-06-06 NOTE — Anesthesia Procedure Notes (Signed)
Procedure Name: Intubation Date/Time: 06/06/2013 1:04 PM Performed by: Manuela Schwartz B Pre-anesthesia Checklist: Patient identified, Emergency Drugs available, Suction available, Patient being monitored and Timeout performed Patient Re-evaluated:Patient Re-evaluated prior to inductionOxygen Delivery Method: Circle system utilized Preoxygenation: Pre-oxygenation with 100% oxygen Intubation Type: IV induction and Rapid sequence Laryngoscope Size: Mac and 3 Grade View: Grade II Endobronchial tube: Left, Double lumen EBT, EBT position confirmed by auscultation and EBT position confirmed by fiberoptic bronchoscope and 35 Fr Number of attempts: 1 Airway Equipment and Method: Stylet Placement Confirmation: ETT inserted through vocal cords under direct vision,  positive ETCO2 and breath sounds checked- equal and bilateral Tube secured with: Tape Dental Injury: Teeth and Oropharynx as per pre-operative assessment

## 2013-06-06 NOTE — Interval H&P Note (Signed)
History and Physical Interval Note: Addendum Trevor Griffin returns today to discuss treatment of his right lower lobe lung nodule.  He is a 58 year old gentleman with a history of alcohol and tobacco abuse who had cancer of the tongue treated with surgery and radiation back in May. On a followup CT scan he was found to have a right lung nodule. This was hypermetabolic by PET. He was originally referred consideration for radiation treatment however Dr. Lisbeth Renshaw with him evaluated is a surgical candidate. I did feel that he was a surgical candidate albeit with higher than normal risk due to his history of coronary disease, stents, recent tongue surgery and head and neck irradiation.  He had agreed to surgery and we had set a date in January. However, he became nervous and cancelled the operation. In the meantime he spoke with other physicians and his wife did as well. They encouraged him to proceed with surgery, so he called back to reschedule.  I felt it was important to meet with the patient and his wife again to make sure that they were comfortable with the decision before proceeding.  I once again explained to the Wing's that we have 2 options. We could do a bronchoscopic biopsy and refer him for radiation therapy or we could proceed with a right thoracoscopy and wedge resection and possible segmentectomy. We talked about the advantages and disadvantages of each of those approaches. They understand each approach as potential for complications. Certainly, complications associated with surgery tend to be more immediate and more severe. He is not an optimal candidate by any means, but I do think the risks are acceptable and not prohibitive.  Reviewed the general nature of the procedure. He understands the need for general anesthesia. He understands there could be potential difficulties with airway access and duel lumen tube placement. We discussed the expected hospital stay and overall recovery. They understand  the indications, risks, benefits, and alternatives. They understand the risks include, but are not limited to death, MI, DVT, PE, stroke, bleeding, possible need for transfusion, air leaks, cardiac arrhythmias, as well as other unforeseeable complications.  After a lengthy discussion (approximately 20 minutes) with [name] they wish to proceed with surgical resection. We have scheduled him for Monday, February 2.  06/06/2013 12:21 PM  [name]  has presented today for surgery, with the diagnosis of RIGHT LUNG NODULE  The various methods of treatment have been discussed with the patient and family. After consideration of risks, benefits and other options for treatment, the patient has consented to  Procedure(s): VIDEO ASSISTED THORACOSCOPY (VATS)/WEDGE RESECTION (Right) SEGMENTECTOMY (Right) as a surgical intervention .  The patient's history has been reviewed, patient examined, no change in status, stable for surgery.  I have reviewed the patient's chart and labs.  Questions were answered to the patient's satisfaction.     Quanell Loughney C

## 2013-06-06 NOTE — Anesthesia Preprocedure Evaluation (Addendum)
Anesthesia Evaluation  Patient identified by MRN, date of birth, ID band Patient awake    Reviewed: Allergy & Precautions, H&P , NPO status , Patient's Chart, lab work & pertinent test results, reviewed documented beta blocker date and time   History of Anesthesia Complications Negative for: history of anesthetic complications  Airway Mallampati: II TM Distance: >3 FB Neck ROM: Full    Dental  (+) Dental Advisory Given   Pulmonary sleep apnea , COPDformer smoker,  H/o trach with tongue cancer/XRT breath sounds clear to auscultation        Cardiovascular hypertension, Pt. on medications and Pt. on home beta blockers + CAD, + Past MI and + Cardiac Stents Rhythm:Regular Rate:Normal  '13 ECHO: normal LVF, mild AI   Neuro/Psych  Headaches, PSYCHIATRIC DISORDERS Anxiety Depression    GI/Hepatic Neg liver ROS, GERD-  Medicated,(+)     substance abuse  alcohol use,   Endo/Other  negative endocrine ROS  Renal/GU negative Renal ROS     Musculoskeletal negative musculoskeletal ROS (+)   Abdominal   Peds  Hematology negative hematology ROS (+)   Anesthesia Other Findings   Reproductive/Obstetrics negative OB ROS                         Anesthesia Physical Anesthesia Plan  ASA: III  Anesthesia Plan: General   Post-op Pain Management:    Induction: Intravenous  Airway Management Planned: Double Lumen EBT and Video Laryngoscope Planned  Additional Equipment: Arterial line and CVP  Intra-op Plan:   Post-operative Plan: Extubation in OR  Informed Consent: I have reviewed the patients History and Physical, chart, labs and discussed the procedure including the risks, benefits and alternatives for the proposed anesthesia with the patient or authorized representative who has indicated his/her understanding and acceptance.   Dental advisory given  Plan Discussed with: CRNA  Anesthesia Plan  Comments:        Anesthesia Quick Evaluation

## 2013-06-07 ENCOUNTER — Inpatient Hospital Stay (HOSPITAL_COMMUNITY): Payer: Medicaid Other

## 2013-06-07 ENCOUNTER — Encounter (HOSPITAL_COMMUNITY): Payer: Self-pay | Admitting: Thoracic Surgery (Cardiothoracic Vascular Surgery)

## 2013-06-07 LAB — BASIC METABOLIC PANEL
BUN: 9 mg/dL (ref 6–23)
CALCIUM: 8.2 mg/dL — AB (ref 8.4–10.5)
CO2: 25 mEq/L (ref 19–32)
Chloride: 96 mEq/L (ref 96–112)
Creatinine, Ser: 0.69 mg/dL (ref 0.50–1.35)
GFR calc Af Amer: >90 mL/min (ref >90)
GFR calc non Af Amer: >90 mL/min (ref >90)
GLUCOSE: 179 mg/dL — AB (ref 70–99)
Potassium: 4.4 mEq/L (ref 3.7–5.3)
Sodium: 133 mEq/L — ABNORMAL LOW (ref 137–147)

## 2013-06-07 LAB — CBC
HEMATOCRIT: 32.3 % — AB (ref 39.0–52.0)
HEMOGLOBIN: 10.9 g/dL — AB (ref 13.0–17.0)
MCH: 30.7 pg (ref 26.0–34.0)
MCHC: 33.7 g/dL (ref 30.0–36.0)
MCV: 91 fL (ref 78.0–100.0)
Platelets: 112 10*3/uL — ABNORMAL LOW (ref 150–400)
RBC: 3.55 MIL/uL — ABNORMAL LOW (ref 4.22–5.81)
RDW: 15.4 % (ref 11.5–15.5)
WBC: 6.3 10*3/uL (ref 4.0–10.5)

## 2013-06-07 LAB — POCT I-STAT 3, ART BLOOD GAS (G3+)
ACID-BASE EXCESS: 2 mmol/L (ref 0.0–2.0)
Bicarbonate: 27.2 mEq/L — ABNORMAL HIGH (ref 20.0–24.0)
O2 SAT: 94 %
Patient temperature: 97.6
TCO2: 29 mmol/L (ref 0–100)
pCO2 arterial: 41.5 mmHg (ref 35.0–45.0)
pH, Arterial: 7.423 (ref 7.350–7.450)
pO2, Arterial: 69 mmHg — ABNORMAL LOW (ref 80.0–100.0)

## 2013-06-07 MED ORDER — HYDROXYZINE HCL 25 MG PO TABS
25.0000 mg | ORAL_TABLET | Freq: Four times a day (QID) | ORAL | Status: AC | PRN
Start: 2013-06-07 — End: 2013-06-10
  Filled 2013-06-07: qty 1

## 2013-06-07 MED ORDER — CHLORDIAZEPOXIDE HCL 25 MG PO CAPS
25.0000 mg | ORAL_CAPSULE | Freq: Four times a day (QID) | ORAL | Status: AC
  Administered 2013-06-07 (×4): 25 mg via ORAL
  Filled 2013-06-07 (×4): qty 1

## 2013-06-07 MED ORDER — CHLORDIAZEPOXIDE HCL 25 MG PO CAPS
25.0000 mg | ORAL_CAPSULE | Freq: Three times a day (TID) | ORAL | Status: AC
  Administered 2013-06-08 (×3): 25 mg via ORAL
  Filled 2013-06-07 (×3): qty 1

## 2013-06-07 MED ORDER — ENOXAPARIN SODIUM 40 MG/0.4ML ~~LOC~~ SOLN
40.0000 mg | SUBCUTANEOUS | Status: DC
  Administered 2013-06-07 – 2013-06-12 (×6): 40 mg via SUBCUTANEOUS
  Filled 2013-06-07 (×6): qty 0.4

## 2013-06-07 MED ORDER — ADULT MULTIVITAMIN W/MINERALS CH: 1.0000 | ORAL_TABLET | Freq: Every day | ORAL | Status: DC

## 2013-06-07 MED ORDER — CHLORDIAZEPOXIDE HCL 25 MG PO CAPS
25.0000 mg | ORAL_CAPSULE | Freq: Four times a day (QID) | ORAL | Status: AC | PRN
  Administered 2013-06-08: 25 mg via ORAL
  Filled 2013-06-07: qty 1

## 2013-06-07 MED ORDER — SODIUM CHLORIDE 0.9 % IV SOLN: INTRAVENOUS | Status: AC

## 2013-06-07 MED ORDER — CHLORDIAZEPOXIDE HCL 25 MG PO CAPS: 25.0000 mg | ORAL_CAPSULE | Freq: Every day | ORAL | Status: AC

## 2013-06-07 MED ORDER — DEXTROMETHORPHAN POLISTIREX 30 MG/5ML PO LQCR
30.0000 mg | Freq: Two times a day (BID) | ORAL | Status: DC
  Administered 2013-06-07 (×2): 30 mg via ORAL
  Filled 2013-06-07 (×4): qty 5

## 2013-06-07 MED ORDER — ENSURE PUDDING PO PUDG
1.0000 | ORAL | Status: DC
  Administered 2013-06-08 – 2013-06-11 (×3): 1 via ORAL

## 2013-06-07 MED ORDER — GUAIFENESIN ER 600 MG PO TB12
1200.0000 mg | ORAL_TABLET | Freq: Two times a day (BID) | ORAL | Status: AC
  Administered 2013-06-07 – 2013-06-11 (×10): 1200 mg via ORAL
  Filled 2013-06-07 (×10): qty 2

## 2013-06-07 MED ORDER — THIAMINE HCL 100 MG/ML IJ SOLN
100.0000 mg | Freq: Once | INTRAMUSCULAR | Status: AC
  Administered 2013-06-07: 100 mg via INTRAMUSCULAR
  Filled 2013-06-07: qty 1

## 2013-06-07 MED ORDER — POTASSIUM CHLORIDE IN NACL 20-0.45 MEQ/L-% IV SOLN
INTRAVENOUS | Status: DC
  Administered 2013-06-07: 10:00:00 via INTRAVENOUS
  Filled 2013-06-07 (×3): qty 1000

## 2013-06-07 MED ORDER — ENSURE COMPLETE PO LIQD
237.0000 mL | Freq: Two times a day (BID) | ORAL | Status: DC
  Administered 2013-06-07 – 2013-06-12 (×7): 237 mL via ORAL

## 2013-06-07 MED ORDER — VITAMIN B-1 100 MG PO TABS
100.0000 mg | ORAL_TABLET | Freq: Every day | ORAL | Status: DC
  Administered 2013-06-08 – 2013-06-12 (×5): 100 mg via ORAL
  Filled 2013-06-07 (×5): qty 1

## 2013-06-07 MED ORDER — ONDANSETRON 4 MG PO TBDP
4.0000 mg | ORAL_TABLET | Freq: Four times a day (QID) | ORAL | Status: AC | PRN
  Filled 2013-06-07: qty 1

## 2013-06-07 MED ORDER — LOPERAMIDE HCL 2 MG PO CAPS
2.0000 mg | ORAL_CAPSULE | ORAL | Status: AC | PRN
  Filled 2013-06-07: qty 2

## 2013-06-07 MED ORDER — CHLORDIAZEPOXIDE HCL 25 MG PO CAPS
25.0000 mg | ORAL_CAPSULE | ORAL | Status: AC
  Administered 2013-06-09 (×2): 25 mg via ORAL
  Filled 2013-06-07 (×2): qty 1

## 2013-06-07 NOTE — Progress Notes (Signed)
1 Day Post-Op Procedure(s) (LRB): VIDEO ASSISTED THORACOSCOPY (VATS)/WEDGE RESECTION (Right) LOBECTOMY (Right) Subjective: C/o cough Some incisional pain  Objective: Vital signs in last 24 hours: Temp:  [97.6 F (36.4 C)-98.4 F (36.9 C)] 97.8 F (36.6 C) (02/03 0700) Pulse Rate:  [66-95] 76 (02/03 0700) Cardiac Rhythm:  [-] Normal sinus rhythm (02/03 0641) Resp:  [13-23] 23 (02/03 0700) BP: (88-139)/(51-89) 92/66 mmHg (02/03 0700) SpO2:  [93 %-100 %] 97 % (02/03 0700) Arterial Line BP: (91-143)/(47-83) 97/55 mmHg (02/03 0700) Weight:  [156 lb 12 oz (71.1 kg)-163 lb 5.8 oz (74.1 kg)] 156 lb 12 oz (71.1 kg) (02/03 0641)  Hemodynamic parameters for last 24 hours:    Intake/Output from previous day: 02/02 0701 - 02/03 0700 In: 3920 [P.O.:240; I.V.:3430; IV Piggyback:250] Out: 1900 [Urine:745; Blood:250; Chest Tube:905] Intake/Output this shift:    General appearance: alert and no distress Neurologic: intact Heart: regular rate and rhythm Lungs: diminished breath sounds bibasilar Abdomen: normal findings: soft, non-tender minimal air leak with cough  Lab Results:  Recent Labs  06/07/13 0430  WBC 6.3  HGB 10.9*  HCT 32.3*  PLT 112*   BMET:  Recent Labs  06/07/13 0430  NA 133*  K 4.4  CL 96  CO2 25  GLUCOSE 179*  BUN 9  CREATININE 0.69  CALCIUM 8.2*    PT/INR: No results found for this basename: LABPROT, INR,  in the last 72 hours ABG    Component Value Date/Time   PHART 7.423 06/07/2013 0439   HCO3 27.2* 06/07/2013 0439   TCO2 29 06/07/2013 0439   ACIDBASEDEF 7.0* 06/11/2007 1401   O2SAT 94.0 06/07/2013 0439   CBG (last 3)  No results found for this basename: GLUCAP,  in the last 72 hours  Assessment/Plan: S/P Procedure(s) (LRB): VIDEO ASSISTED THORACOSCOPY (VATS)/WEDGE RESECTION (Right) LOBECTOMY (Right) POD # 1 CV- SR, but BP relatively low- will bolus with saline  RESP- s/p lobectomy  Minimal air leak- CT to water seal  IS, nebs  RENAL- lytes,  creatinine OK  ENDO- CBG up - dc D5  SSI, restart home meds  OOB/ ambulate  SCD + enoxaparin for DVT prophylaxis   LOS: 1 day    Trevor Griffin C 06/07/2013

## 2013-06-07 NOTE — Progress Notes (Signed)
UR Completed.  Vergie Living [phone] 06/07/2013

## 2013-06-07 NOTE — Progress Notes (Signed)
INITIAL NUTRITION ASSESSMENT  DOCUMENTATION CODES Per approved criteria  -Not Applicable   INTERVENTION: Provide Ensure Complete BID in between meals Provide Ensure Pudding and Magic Cup once daily Encourage PO intake  NUTRITION DIAGNOSIS: Inadequate oral intake related to swallowing difficulty as evidenced by 17% weight loss.   Goal: Pt to meet >/= 90% of their estimated nutrition needs   Monitor:  PO intake Weight Labs  Reason for Assessment: Malnutrition Screening Tool, score of 5  58 y.o. 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 On a followup CT scan he was found to have a right lung nodule.  1 day s/p VIDEO ASSISTED THORACOSCOPY (VATS)/ WEDGE RESECTION (Right) LOBECTOMY (Right)  Pt reports that he usually weighs 189 lbs but, due to tongue cancer and swallowing difficulty he has not been able to eat well and has lost over 40 lbs. Pt reports drinking Ensure Plus twice daily PTA and eating mostly macaroni and cheese, ravioli, mashed potatoes, and other soft foods. Pt states his appetite has been good. Pt eating lunch at time of visit; states he can't eat the cake or bread. RD brought pt an Ensure Pudding to replace cake and a menu to aid pt in ordering future meals. Weight history shows that pt's weight has been fairly stable for the past 6 months.   Height: Ht Readings from Last 1 Encounters:  06/06/13 5\' 8"  (1.727 m)    Weight: Wt Readings from Last 1 Encounters:  06/07/13 156 lb 12 oz (71.1 kg)    Ideal Body Weight: 154 lbs  % Ideal Body Weight: 101%  Wt Readings from Last 10 Encounters:  06/07/13 156 lb 12 oz (71.1 kg)  06/07/13 156 lb 12 oz (71.1 kg)  06/02/13 154 lb 3.2 oz (69.945 kg)  05/31/13 147 lb (66.679 kg)  05/17/13 147 lb (66.679 kg)  04/26/13 147 lb (66.679 kg)  01/21/13 153 lb  11.2 oz (69.718 kg)  12/10/12 151 lb 1.6 oz (68.539 kg)  12/06/12 150 lb 12.8 oz (68.402 kg)  11/30/12 153 lb 8 oz (69.627 kg)    Usual Body Weight: 189 lbs  % Usual Body Weight: 83%  BMI:  Body mass index is 23.84 kg/(m^2).  Estimated Nutritional Needs: Kcal: 1900-2100 Protein: 90-100 grams Fluid: 1.8-2 L/day  Skin: right chest incision, wound on anterior neck  Diet Order: Dysphagia  EDUCATION NEEDS: -No education needs identified at this time   Intake/Output Summary (Last 24 hours) at 06/07/13 1452 Last data filed at 06/07/13 1400  Gross per 24 hour  Intake 3205.8 ml  Output   2445 ml  Net  760.8 ml    Last BM: PTA  Labs:   Recent Labs Lab 06/02/13 1500 06/07/13 0430  NA 139 133*  K 4.5 4.4  CL 99 96  CO2 23 25  BUN 6 9  CREATININE 0.58 0.69  CALCIUM 9.0 8.2*  GLUCOSE 83 179*    CBG (last 3)  No results found for this basename: GLUCAP,  in the last 72 hours  Scheduled Meds: . acetaminophen  1,000 mg Oral Q6H   Or  . acetaminophen (TYLENOL) oral liquid 160 mg/5 mL  1,000 mg Oral Q6H  . aspirin  325 mg Oral Daily  . atorvastatin  80 mg Oral Daily  . bisacodyl  10 mg Oral Daily  . bisoprolol  15  mg Oral Daily  . buPROPion  150 mg Oral BID  . chlordiazePOXIDE  25 mg Oral QID   Followed by  . [START ON 06/08/2013] chlordiazePOXIDE  25 mg Oral TID   Followed by  . [START ON 06/09/2013] chlordiazePOXIDE  25 mg Oral BH-qamhs   Followed by  . [START ON 06/10/2013] chlordiazePOXIDE  25 mg Oral Daily  . dextromethorphan  30 mg Oral BID  . enoxaparin (LOVENOX) injection  40 mg Subcutaneous Q24H  . fentaNYL   Intravenous Q4H  . guaiFENesin  1,200 mg Oral BID  . influenza vac split quadrivalent PF  0.5 mL Intramuscular Tomorrow-1000  . multivitamin with minerals  1 tablet Oral Daily  . pantoprazole  40 mg Oral Daily  . pneumococcal 23 valent vaccine  0.5 mL Intramuscular Tomorrow-1000  . [START ON 06/08/2013] thiamine  100 mg Oral Daily  . traZODone  50 mg  Oral QHS    Continuous Infusions: . 0.45 % NaCl with KCl 20 mEq / L 75 mL/hr at 06/07/13 1000    Past Medical History  Diagnosis Date  . History of tobacco abuse     quit Oct '12  . Hyperlipidemia   . Ischemic cardiomyopathy     Echo 08/20/11 with EF 50-55% (previously 35%)  . Hypertension   . Obesity   . Depression   . CAD (coronary artery disease)     Ant STEMI 5/08 with 4 Taxus DES proximal to mid LAD. 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 Cath in 2/09 also showed 80% mid to distal CFX stenosis. Myoview (6/11): EF 37%, large anteroapical infarct with no ischemia. [place] 10/12: LHC with prox to mid LAD stent ok with 10-20% ISR, mLAD 40%, CFX 30-40%, mCFX 90%, EF 35% - DES to mCFX  . GERD (gastroesophageal reflux disease)   . COPD (chronic obstructive pulmonary disease)   . Alcohol abuse   . Fatigue     sleep test neg 2008  . Anterior myocardial infarction 2008  . Palpitations     For event monitor 01/2012  . Tongue cancer   . Hx of radiation therapy 10/25/12-12/10/12     Right Tongue/ Bilateral Neck //Total Dose, 60 Gy in 30 Fractions  . Sleep apnea     not on CPAP  . Headache(784.0)   . Anxiety     Past Surgical History  Procedure Laterality Date  . Cardiac catheterization      ANTERIOR STEMI W/4  OVERLAPPING TAXUS STENTS IN THE PROXIMAL TO MID LAD  . Coronary angioplasty    . Fracture surgery Right     as a child,leg  . Video assisted thoracoscopy (vats)/wedge resection Right 06/06/2013    Procedure: VIDEO ASSISTED THORACOSCOPY (VATS)/WEDGE RESECTION;  Surgeon: Melrose Nakayama, MD;  Location: [facility];  Service: Thoracic;  Laterality: Right;  . Lobectomy Right 06/06/2013    Procedure: LOBECTOMY;  Surgeon: Melrose Nakayama, MD;  Location: [facility];  Service: Thoracic;  Laterality: Right;    Pryor Ochoa RD, LDN Inpatient Clinical Dietitian Pager: [phone] After Hours Pager: [phone]

## 2013-06-07 NOTE — Progress Notes (Signed)
CT surgery p.m. Rounds  Postop day 1 right VATS, right lower lobectomy Patient ambulated hallway today Breathing comfortably with saturation greater than 95% Minimal airleak, if any Doing well this evening

## 2013-06-08 ENCOUNTER — Inpatient Hospital Stay (HOSPITAL_COMMUNITY): Payer: Medicaid Other

## 2013-06-08 LAB — CBC
HCT: 31.4 % — ABNORMAL LOW (ref 39.0–52.0)
Hemoglobin: 10.4 g/dL — ABNORMAL LOW (ref 13.0–17.0)
MCH: 30.4 pg (ref 26.0–34.0)
MCHC: 33.1 g/dL (ref 30.0–36.0)
MCV: 91.8 fL (ref 78.0–100.0)
PLATELETS: 101 10*3/uL — AB (ref 150–400)
RBC: 3.42 MIL/uL — ABNORMAL LOW (ref 4.22–5.81)
RDW: 15.5 % (ref 11.5–15.5)
WBC: 4.3 10*3/uL (ref 4.0–10.5)

## 2013-06-08 LAB — COMPREHENSIVE METABOLIC PANEL
ALBUMIN: 2.6 g/dL — AB (ref 3.5–5.2)
ALT: 20 U/L (ref 0–53)
AST: 26 U/L (ref 0–37)
Alkaline Phosphatase: 75 U/L (ref 39–117)
BILIRUBIN TOTAL: 0.6 mg/dL (ref 0.3–1.2)
BUN: 5 mg/dL — ABNORMAL LOW (ref 6–23)
CHLORIDE: 95 meq/L — AB (ref 96–112)
CO2: 25 mEq/L (ref 19–32)
CREATININE: 0.61 mg/dL (ref 0.50–1.35)
Calcium: 8 mg/dL — ABNORMAL LOW (ref 8.4–10.5)
GFR calc Af Amer: >90 mL/min (ref >90)
GFR calc non Af Amer: >90 mL/min (ref >90)
Glucose, Bld: 96 mg/dL (ref 70–99)
POTASSIUM: 4.2 meq/L (ref 3.7–5.3)
Sodium: 130 mEq/L — ABNORMAL LOW (ref 137–147)
Total Protein: 5.8 g/dL — ABNORMAL LOW (ref 6.0–8.3)

## 2013-06-08 MED ORDER — DIPHENHYDRAMINE HCL 25 MG PO CAPS: 25.0000 mg | ORAL_CAPSULE | Freq: Every evening | ORAL | Status: DC | PRN

## 2013-06-08 MED ORDER — SODIUM CHLORIDE 0.9 % IV SOLN
INTRAVENOUS | Status: DC
Start: 2013-06-08 — End: 2013-06-12
  Administered 2013-06-08 – 2013-06-09 (×2): 20 mL/h via INTRAVENOUS

## 2013-06-08 MED ORDER — BENZONATATE 100 MG PO CAPS
200.0000 mg | ORAL_CAPSULE | Freq: Three times a day (TID) | ORAL | Status: DC | PRN
  Administered 2013-06-08: 200 mg via ORAL
  Filled 2013-06-08 (×2): qty 2

## 2013-06-08 MED ORDER — SODIUM CHLORIDE 0.9 % IJ SOLN
3.0000 mL | INTRAMUSCULAR | Status: DC | PRN
  Administered 2013-06-08: 3 mL via INTRAVENOUS

## 2013-06-08 NOTE — Progress Notes (Signed)
Patient transferred from 2s with RN at ~1730.  Patient alert and oriented x4 with Right Chest tube draining serous sang drainage. Pt c/o 8/10 surgical site pain and demonstrated how to use PCA correctly.

## 2013-06-08 NOTE — Progress Notes (Signed)
2 Days Post-Op Procedure(s) (LRB): VIDEO ASSISTED THORACOSCOPY (VATS)/WEDGE RESECTION (Right) LOBECTOMY (Right) Subjective: [name] cough and inability to sleep Pain controlled with PCA  Objective: Vital signs in last 24 hours: Temp:  [97.5 F (36.4 Griffin)-98.1 F (36.7 Griffin)] 97.7 F (36.5 Griffin) (02/04 0723) Pulse Rate:  [51-91] 67 (02/04 0700) Cardiac Rhythm:  [-] Normal sinus rhythm (02/04 0600) Resp:  [11-22] 16 (02/04 0700) BP: (82-113)/(36-74) 107/68 mmHg (02/04 0700) SpO2:  [91 %-100 %] 96 % (02/04 0700) Arterial Line BP: (89-113)/(48-64) 102/60 mmHg (02/03 1700) Weight:  [162 lb 4.1 oz (73.6 kg)] 162 lb 4.1 oz (73.6 kg) (02/04 0500)  Hemodynamic parameters for last 24 hours:    Intake/Output from previous day: 02/03 0701 - 02/04 0700 In: 2607.2 [P.O.:300; I.V.:2257.2; IV Piggyback:50] Out: 6060 [Urine:1910; Chest Tube:560] Intake/Output this shift:    General appearance: alert and no distress Neurologic: intact Heart: regular rate and rhythm Lungs: diminished breath sounds right base no air leak, serous drainage  Lab Results:  Recent Labs  06/07/13 0430 06/08/13 0410  WBC 6.3 4.3  HGB 10.9* 10.4*  HCT 32.3* 31.4*  PLT 112* 101*   BMET:  Recent Labs  06/07/13 0430 06/08/13 0410  NA 133* 130*  K 4.4 4.2  CL 96 95*  CO2 25 25  GLUCOSE 179* 96  BUN 9 5*  CREATININE 0.69 0.61  CALCIUM 8.2* 8.0*    PT/INR: No results found for this basename: LABPROT, INR,  in the last 72 hours ABG    Component Value Date/Time   PHART 7.423 06/07/2013 0439   HCO3 27.2* 06/07/2013 0439   TCO2 29 06/07/2013 0439   ACIDBASEDEF 7.0* 06/11/2007 1401   O2SAT 94.0 06/07/2013 0439   CBG (last 3)  No results found for this basename: GLUCAP,  in the last 72 hours  Assessment/Plan: S/P Procedure(s) (LRB): VIDEO ASSISTED THORACOSCOPY (VATS)/WEDGE RESECTION (Right) LOBECTOMY (Right) Plan for transfer to step-down: see transfer orders POD # 2 Doing well overall  CV- stable  RESP- CXR  shows a little larger space but no air leak- will dc anterior CT  Continue IS, bronchodilators  RENAL- hyponatremia- mils- dc 1/2 NS  Anemia secondary to ABL- mild, follow  Thrombocytopenia- follow, continue enoxaparin for now   LOS: 2 days    Trevor Griffin 06/08/2013

## 2013-06-08 NOTE — Progress Notes (Signed)
Report given to Glenford Peers, RN. Patient transferred to 3S07 with stable vital signs. All belongings sent with patient at time of transfer.

## 2013-06-08 NOTE — Op Note (Signed)
NAME:  Trevor Griffin, Trevor Griffin NO.:  [id number]  MEDICAL RECORD NO.:  [id number]  LOCATION:  2S06C                        FACILITY:  [facility]  PHYSICIAN:  Revonda Standard. Roxan Hockey, M.D.DATE OF BIRTH:  04/22/1956  DATE OF PROCEDURE:  06/06/2013 DATE OF DISCHARGE:                              OPERATIVE REPORT   PREOPERATIVE DIAGNOSIS:  Right lower lobe mass.  POSTOPERATIVE DIAGNOSIS:  Non-small-cell carcinoma, right lower lobe.  PROCEDURE:  Right video-assisted thoracoscopy, wedge resection, thoracoscopic right lower lobectomy, lymph node dissection.  SURGEON:  Revonda Standard. Roxan Hockey, MD  ASSISTANT:  Lars Pinks, PA  ANESTHESIA:  General.  FINDINGS:  Mass easily palpable in basilar portion of right lower lobe. Frozen section revealed non-small cell cancer, margin was unclear. Final margin after lobectomy showed no tumor.  Adhesions of the apex of the lung to the chest wall and some foreshortening in the area of the azygos limiting the ability to dissect the mediastinal lymph nodes.  CLINICAL NOTE:  Trevor Griffin is a 58 year old gentleman who recently had been treated for squamous cell carcinoma of the tongue.  As part of his follow up, a CT of the chest was done. It showed a 4 mm right lower lobe nodule had grown to 12 mm in size and was cavitary.  A PET-CT was done and the lesion was hypermetabolic in the malignant range.  He was referred for possible radiation therapy, but Dr. Lisbeth Renshaw wanted him evaluated as a possible surgical candidate.  Although the patient was a high risk candidate, it was felt that he could tolerate a surgical resection.  The appearance on imaging was highly suspicious for a second primary, although metastasis could not be definitively ruled out.  The patient was advised to undergo right video-assisted thoracoscopy with wedge resection, to be followed by segmentectomy or lower lobectomy depending on the results of frozen section.  The  indications, risks, benefits, and alternatives were discussed in detail with the patient.  He understood and accepted the risks and agreed to proceed.  OPERATIVE NOTE:  Trevor Griffin was brought to the preoperative holding area on June 06, 2013, there Anesthesia placed a central line and arterial blood pressure monitoring line.  He was taken to the operating room, anesthetized, and intubated with a double-lumen endotracheal tube. Flexible bronchoscopy confirmed positioning of the dual lumen tube. Intravenous antibiotics were administered.  A Foley catheter was placed. Sequential compression devices were placed on the legs for DVT prophylaxis.  He was placed in the left lateral decubitus position.  The right chest was prepped and draped in usual sterile fashion.  Single lung ventilation of the left lung was initiated.  This was initially tolerated well.  After initially starting with single lung ventilation, a small port incision was made in the right lateral chest in the midaxillary line at approximately the seventh intercostal space.  A port was placed through the incision and a thoracoscope was placed into the chest.  There were some adhesions of the apex to the apical chest wall.  The lower lobe was inspected.  There was a visible mass on the inferior aspect of the lower lobe.  A small utility incision was made approximately 2 interspaces  above the original port incision.  This was approximately 6 cm in length.  No rib spreading was performed during the procedure.  The area of the lower lobe containing the mass was grasped.  A wedge resection was performed with sequential firings of an endoscopic GIA stapler using purple cartridges.  The specimen was placed into an endoscopic retrieval bag and removed and sent for frozen section.  At this point, there was desaturation requiring dual lung ventilation, ultimately those issues resolved after bronchoscopy and suctioning cleared mucous  plugging on the left side.  Frozen section returned as squamous cell carcinoma.  It was unclear if there was involvement at the margin. This could not be definitively determined to be metastatic disease rather than a primary bronchogenic carcinoma.  The decision was made to proceed with resection as planned given that the patient had adequate pulmonary function.  The inferior ligament was divided and level 8 and 9 nodes were taken as separate specimens. The pleural reflection was divided at the hilum anteriorly and posteriorly.  The fissures were relatively incomplete and the pulmonary artery was relatively deep within the lung parenchyma, making the dissection relatively difficult.  The adhesions of the upper lobe to the apex were taken down bluntly.  The inferior pulmonary vein was dissected out and encircled.  At this point, it was decided that lobectomy would be more appropriate than a segmentectomy due to difficulty dissecting out the artery.  The vein was divided with an endoscopic vascular stapler.  Next, the major fissure was completed with sequential endoscopic stapler firings.  At this point, the lower lobe pulmonary artery branches and lower lobe and middle lobe bronchi were identified.  Nodes that were taken during the dissection were sent as separate specimens.  The basilar segmental artery branches were encircled.  This dissection proceeded relatively slowly.  The superior segmental branch was identified and likewise dissected out and encircled.  An endoscopic vascular stapler then was used to divide the basilar and superior segmental branches.  The major fissure then was completed superiorly between the superior segment and the right upper lobe.  The lower lobe bronchus was dissected out and a staple was placed across the bronchus taking care not to compromise the middle lobe bronchus.  The stapler was closed, but with a test inflation, there was no aeration in the middle  lobe, although there was good aeration in the upper lobe.  The stapler was removed.  The patient was suctioned out.  The stapler was again applied again taking care to preserve the integrity of the middle lobe bronchus.  At this time, there was good aeration of both the upper and middle lobes.  The stapler was Fired, dividing the bronchus.  The specimen was placed into an endoscopic retrieval bag and removed through the incision.  It was then sent for frozen section and the margins were clear with no tumor seen.  Level 10 nodes were identified and were dissected out and taken as separate specimens. There were adhesions and some foreshortening of the tissue in the right paratracheal area.  The pleura was incised superior to the azygos, but no obvious nodes were encountered.  No further dissection was carried out.  Likewise, there were no obvious nodes in the subcarinal area and rather than pursue additional dissection in that area, it was left alone.  The chest was copiously irrigated with warm saline.  A test inflation at 30 cm of water showed no leakage from the bronchial stump.  There was  some leakage along the middle lobe staple line.  ProGEL was applied to this area.  A 32-French Blake drain was placed through the original port incision and directed posteriorly.  A 28-French chest tube was placed through a second stab incision more anteriorly.  The upper and middle lobes were reinflated.  The utility incision was closed with #1 Vicryl fascial suture followed by 2-0 Vicryl subcutaneous suture, and a 3-0 Vicryl subcuticular suture.  All sponge, needle, and instrument counts were correct at the end of the procedure. The patient was taken from the operating room to the postanesthetic care unit, extubated, and in good condition.     Revonda Standard Roxan Hockey, M.D.     SCH/MEDQ  D:  06/07/2013  T:  06/08/2013  Job:  [id number]

## 2013-06-09 ENCOUNTER — Inpatient Hospital Stay (HOSPITAL_COMMUNITY): Payer: Medicaid Other

## 2013-06-09 LAB — CBC
HEMATOCRIT: 32.2 % — AB (ref 39.0–52.0)
Hemoglobin: 10.6 g/dL — ABNORMAL LOW (ref 13.0–17.0)
MCH: 30.2 pg (ref 26.0–34.0)
MCHC: 32.9 g/dL (ref 30.0–36.0)
MCV: 91.7 fL (ref 78.0–100.0)
Platelets: 120 10*3/uL — ABNORMAL LOW (ref 150–400)
RBC: 3.51 MIL/uL — ABNORMAL LOW (ref 4.22–5.81)
RDW: 15.4 % (ref 11.5–15.5)
WBC: 4 10*3/uL (ref 4.0–10.5)

## 2013-06-09 LAB — BASIC METABOLIC PANEL
BUN: 5 mg/dL — AB (ref 6–23)
CALCIUM: 8.4 mg/dL (ref 8.4–10.5)
CHLORIDE: 93 meq/L — AB (ref 96–112)
CO2: 27 meq/L (ref 19–32)
CREATININE: 0.66 mg/dL (ref 0.50–1.35)
GFR calc non Af Amer: >90 mL/min (ref >90)
Glucose, Bld: 96 mg/dL (ref 70–99)
Potassium: 4.1 mEq/L (ref 3.7–5.3)
Sodium: 130 mEq/L — ABNORMAL LOW (ref 137–147)

## 2013-06-09 MED ORDER — LACTULOSE 10 GM/15ML PO SOLN
20.0000 g | Freq: Once | ORAL | Status: AC
  Administered 2013-06-09: 20 g via ORAL
  Filled 2013-06-09: qty 30

## 2013-06-09 MED ORDER — BISOPROLOL FUMARATE 5 MG PO TABS
15.0000 mg | ORAL_TABLET | Freq: Every day | ORAL | Status: DC
  Administered 2013-06-10 – 2013-06-12 (×3): 15 mg via ORAL
  Filled 2013-06-09 (×3): qty 1

## 2013-06-09 NOTE — Discharge Summary (Signed)
Physician Discharge Summary       [address]       [place] [address]             [phone]    Patient ID: Trevor Griffin MRN: [id number] DOB/AGE: [birth year] 58 y.o.  Admit date: 06/06/2013 Discharge date: 06/13/2013  Admission Diagnoses: 1. Right lower lobe nodule 2.History of tongue cancer (s/p resection and radiation) 3. History of tobacco abuse 4. History of hyperlipidemia 5. History of hypertension 6. History of CAD (s/p STEMI,PCI and stent) 7. History of COPD 8. History of GERD 9. History of ischemic cardiomyopathy 10. History of alcohol abuse  Discharge Diagnoses:   1. 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 History of tobacco abuse 4. History of hyperlipidemia 5. History of hypertension 6. History of CAD (s/p STEMI,PCI and stent) 7. History of COPD 8. History of GERD 9. History of ischemic cardiomyopathy 10. History of alcohol abuse 11. Mild ABL anemia 12. Mild thrombocytopenia  Procedure (s):  Right video-assisted thoracoscopy, wedge resection,  thoracoscopic right lower lobectomy, lymph node dissection by Dr. Roxan Hockey on 06/06/2013.   Pathology: . Lung, wedge biopsy/resection, Right lower lobe - SQUAMOUS CELL CARCINOMA, 3 CM. 2. Lung, wedge biopsy/resection, Right upper lobe - BENIGN LUNG TISSUE. - NO TUMOR IDENTIFIED. 3. Lung, resection (segmental or lobe), Right lower lobe - FOCAL HEMORRHAGE CONSISTENT WITH PREVIOUS WEDGE. - NO RESIDUAL TUMOR IDENTIFIED. - FINAL MARGINS CLEAR. 4. Lymph node, biopsy, 11R#1 - ANTHRACOTIC LYMPH NODE. - NO TUMOR IDENTIFIED 5. Lymph node, biopsy, 11R#2 - ANTHRACOTIC LYMPH NODE. - NO TUMOR IDENTIFIED. 6. Lymph node, biopsy, 10 R #1 - ANTHRACOTIC LYMPH NODE. - NO TUMOR IDENTIFIED. Marland Kitchen 7. Lymph node, biopsy, 10 R #2 - ANTHRACOTIC LYMPH NODE. - NO TUMOR IDENTIFIED. 8. Lymph node, biopsy, level 9 -  ANTHRACOTIC LYMPH NODE. - NO TUMOR IDENTIFIED.  TNM Code:TNM code: pT1b , pN0   History of Presenting Illness: This is a 58 year old gentleman with an 80-pack-year history of smoking. Back in May, he was diagnosed with squamous cell carcinoma of the tongue. This turned out to be a T3 N1, grade 2 lesion. He underwent resection and reconstruction at [facility]. He then was treated with 60 gray of radiation in 30 fractions by Dr. Lisbeth Renshaw. Radiation was completed in August.  On his initial a CT of the chest earlier in the year, he had a 4 mm right lower lobe nodule. A repeat CT in October the lesion had grown to 12 mm and was cavitary. A PET CT was done on November 6. By that time, the lesion had increased in size again, although I think that this was in large part due to the methodology of the measurement. The lesion was hypermetabolic with malignant range uptake.  He saw Dr. Lisbeth Renshaw to discuss possible SBRT of the right lower lobe lesion. Dr. Lisbeth Renshaw wanted to obtain a biopsy before radiation if possible, but also wanted him evaluated as a possible surgical candidate. He had to cancel multiple appointments with me in November and December due to him and multiple family members having the flu. PFTs results were FVC 3.68 (90%),FEV1 2.81 (90%),FEV1/FVC 76%,  DLCO 104%. He has been having difficulty swallowing since his tongue surgery. He has lost 32 pounds over the past 6 months and 18 pounds over the past 3 months. He currently is 147 pounds. His appetite is poor. He also complains of decreased energy. He does  get short of breath with exertion and has a dry cough. He also complains of anxiety, depression, and nervousness. Also complains of chronic pain in his right arm and some limitation of motion in the right arm. He also has pain and limitation of motion in his right leg. He is able to ambulate without assistance. He continues to smoke.  He does have an extensive cardiac history with 2 heart attacks in  2008 and 2009. In 2008 he had multiple stents placed for LAD occlusion. In 2009 he had a stent thrombosis requiring re\re intervention. He also had additional stent placed in 2012. He was cleared for surgery back in June by the cardiologist at [facility] although he is followed by Dr. Aundra Dubin here in [place]. He did not have any cardiac issues with his head and neck surgery. He is not having any chest pain, pressure, or tightness. Dr. Roxan Hockey discussed the need for a right VATS, wedge resection of RLL, and possible RLL. Potential risks, complications, and benefits of the surgery were discussed with the patient and he agreed to proceed. He underwent a right VATS, wedge RLL, thoracoscopic RLL, and lymph node dissection on 2/2.  Brief Hospital Course:  He remained afebrile and hemodynamically stable. His a line and foley were removed early in his post operative course. Daily chest x rays were obtained and remained stable. Chest tube output gradually decreased and there was no air leak. Chest tubes were placed to water seal.All chest tubes were removed by 06/10/2013. He is ambulating on room air. He is tolerating a diet and has had a bowel movement. He did have hypotension which improved after discontinuation of his beta blocker.    Latest Vital Signs: Blood pressure 112/63, pulse 77, temperature 97.6 F (36.4 C), temperature source Oral, resp. rate 19, height 5\' 8"  (1.727 m), weight 74.3 kg (163 lb 12.8 oz), SpO2 94.00%.  Physical Exam: Cardiovascular: RRR.  Pulmonary: Coarse breath sounds R>L; no rales, wheezes, or rhonchi.  Abdomen: Soft, non tender, slightly distended,bowel sounds present.  Extremities: No lower extremity edema.  Wounds: Clean and dry. No erythema or signs of infection.  Chest Tube: to water seal   Discharge Condition:Stable  Recent laboratory studies:  Lab Results  Component Value Date   WBC 4.0 06/09/2013   HGB 10.6* 06/09/2013   HCT 32.2* 06/09/2013   MCV 91.7 06/09/2013    PLT 120* 06/09/2013   Lab Results  Component Value Date   NA 133* 06/11/2013   K 4.0 06/11/2013   CL 96 06/11/2013   CO2 28 06/11/2013   CREATININE 0.80 06/11/2013   GLUCOSE 92 06/11/2013      Diagnostic Studies:   EXAM:  PORTABLE CHEST - 1 VIEW  COMPARISON: 06/11/2013  FINDINGS:  Cardiac shadow is stable. Postsurgical changes are again noted on  the right with evidence of pneumothorax. The overall appearance is  stable from the prior exam. Right basilar infiltrative change is  again identified. The left lung remains clear.  IMPRESSION:  No change from the prior exam.  Electronically Signed  By: Inez Catalina M.D.  On: 06/12/2013 11:42      Future Appointments Provider Department Dept Phone   06/17/2013 11:00 AM [facility] [phone]   06/17/2013 11:20 AM [name], MD [facility] [phone]   06/28/2013 2:00 PM Melrose Nakayama, MD [facility] [phone]      Discharge Medications:   Medication List  STOP taking these medications       bisoprolol 10 MG tablet  Commonly known as:  ZEBETA     lisinopril 10 MG tablet  Commonly known as:  PRINIVIL,ZESTRIL     oxyCODONE-acetaminophen 10-325 MG per tablet  Commonly known as:  PERCOCET  Replaced by:  oxyCODONE-acetaminophen 5-325 MG per tablet      TAKE these medications       aspirin 325 MG tablet  Take 1 tablet (325 mg total) by mouth daily.     atorvastatin 80 MG tablet  Commonly known as:  LIPITOR  Take 1 tablet (80 mg total) by mouth daily.     buPROPion 150 MG 12 hr tablet  Commonly known as:  WELLBUTRIN SR  Take 150 mg by mouth 2 (two) times daily.     LORazepam 2 MG tablet  Commonly known as:  ATIVAN  Take 1 tablet (2 mg total) by mouth every 8 (eight) hours as needed for anxiety.     multivitamin with minerals tablet  Take 1 tablet by mouth daily.     nitroGLYCERIN 0.4  MG SL tablet  Commonly known as:  NITROSTAT  Place 1 tablet (0.4 mg total) under the tongue every 5 (five) minutes as needed (up to 3 doses). For chest pain     oxyCODONE-acetaminophen 5-325 MG per tablet  Commonly known as:  PERCOCET/ROXICET  Take 1-2 tablets by mouth every 4 (four) hours as needed for severe pain.     traMADol 50 MG tablet  Commonly known as:  ULTRAM  Take 50 mg by mouth every 6 (six) hours as needed for moderate pain.     traZODone 50 MG tablet  Commonly known as:  DESYREL  Take 1 tablet (50 mg total) by mouth at bedtime.        Follow Up Appointments: Follow-up Information   Follow up with Melrose Nakayama, MD On 06/09/2013. (PA/LAT CXR to be taken (at [facility] which is in the same building as Dr. Leonarda Salon office) on 06/28/2013 at 1:00 pm  ;Appointment with Dr. Roxan Hockey is at 2:00 pm )    Specialty:  Cardiothoracic Surgery   Contact information:   [address] [place] [address] [phone]       Signed: Lars Pinks MPA-C 06/13/2013, 2:17 PM

## 2013-06-09 NOTE — Progress Notes (Addendum)
      [address]       [place] [address]             [phone]       3 Days Post-Op Procedure(s) (LRB): VIDEO ASSISTED THORACOSCOPY (VATS)/WEDGE RESECTION (Right) LOBECTOMY (Right)  Subjective: Patient's has some incisional pain, belly pain last evening (no nausea or emesis) that he is certain is from constipation, and is not sleeping.  Objective: Vital signs in last 24 hours: Temp:  [97.4 F (36.3 C)-99.1 F (37.3 C)] 99.1 F (37.3 C) (02/05 0300) Pulse Rate:  [62-80] 67 (02/05 0300) Cardiac Rhythm:  [-] Normal sinus rhythm (02/05 0300) Resp:  [13-28] 17 (02/05 0300) BP: (87-138)/(47-86) 92/54 mmHg (02/05 0300) SpO2:  [20 %-100 %] 93 % (02/05 0300)     Intake/Output from previous day: 02/04 0701 - 02/05 0700 In: 983.8 [P.O.:480; I.V.:503.8] Out: 2615 [Urine:2225; Chest Tube:390]   Physical Exam:  Cardiovascular: RRR. Pulmonary: Coarse breath sounds R>L; no rales, wheezes, or rhonchi. Abdomen: Soft, non tender, slightly distended,bowel sounds present. Extremities: No lower extremity edema. Wounds: Clean and dry.  No erythema or signs of infection. Chest Tube: to water seal  Lab Results: CBC: Recent Labs  06/08/13 0410 06/09/13 0315  WBC 4.3 4.0  HGB 10.4* 10.6*  HCT 31.4* 32.2*  PLT 101* 120*   BMET:  Recent Labs  06/08/13 0410 06/09/13 0315  NA 130* 130*  K 4.2 4.1  CL 95* 93*  CO2 25 27  GLUCOSE 96 96  BUN 5* 5*  CREATININE 0.61 0.66  CALCIUM 8.0* 8.4    PT/INR: No results found for this basename: LABPROT, INR,  in the last 72 hours ABG:  INR: Will add last result for INR, ABG once components are confirmed Will add last 4 CBG results once components are confirmed  Assessment/Plan:  1. CV - SR. SBP in the 90's this am. There are parameters for bisprolol and will hold this am. 2.  Pulmonary - Chest tube with 390 cc of output last 24 hours. Chest tube is to water seal. There is some tidling in the chest tube with  cough, but no obvious air leak. CXR shows patient is rotated to the left, small right apical pneumothorax. Final pathology showed SCC.TNM code: pT1b , pN0 3.ABL anemia-H and H stable at 10.6 and 32.2 4.Thrombocytopenia-platelets up to 120,000 5. Hyponatremia-sodium remains 130 6.LOC constipation 7. On Trazodone at bedtime, which patient states he takes for sleep 8. Stop PCA after last chest tube removed  Aviendha Azbell MPA-C 06/09/2013,7:55 AM

## 2013-06-09 NOTE — Discharge Instructions (Signed)
ACTIVITY:  1.Increase activity slowly. 2.Walk daily and increase frequency and duration as tolerates. 3.May walk up steps. 4.No lifting more than ten pounds for two weeks. 5.No driving for two weeks. 6.Avoid straining. 7.STOP any activity that causes chest pain, shortness of breath, dizziness,sweating,     or excessive weakness. 8.Continue with breathing exercises daily.  DIET:  Low fat, Low salt  diet   WOUND:  1.May shower. 2.Clean wounds with mild soap and water.  Call the office at [phone] if any  problems arise.  Video-Assisted Thoracic Surgery Video-assisted thoracic surgery (VATS) is a procedure that allows your caregiver to look inside your chest and do some minor operations. VATS is commonly done to:  Study or diagnose problems in the chest.  Take a tissue sample (biopsy).   Put medications directly into the chest.   Remove collections of fluid, pus, or blood.  Remove tumors. VATS is done using thoracoscopy. Thoracoscopy is a procedure in which a thin, lighted tube (thoracoscope) is put through a small surgical cut (incision)inyour chest wall. The thoracoscope used for VATS has a video camera on it. It allows your caregiver to see the inside of your chest on a television screen. LET YOUR CAREGIVER KNOW ABOUT:   Any allergies you have.  All medications you are taking, including vitamins, herbs, eyedrops, and over-the-counter medications and creams.  Use of steroids (by mouth or creams).   Previous problems you or members of your family have had with the use of anesthetics.  Any blood disorders you have had.  Any blood thinners, like warfarin, that you take.  Previous surgery.   Any history of heart problems.  Other health problems you RISKS AND COMPLICATIONS  Severe bleeding (hemorrhage).  Injury to nerves or other structures in the chest.   Lung infection.  The chest may need to be opened with a large incision (thoracotomy) if the  procedure cannot be completed safely with the thoracoscope. BEFORE THE PROCEDURE   Tests such as blood tests, urine tests, chest X-rays, and electrocardiography may be done.  Follow your caregiver's instructions if you are taking dietary supplements or medications. Your caregiver may tell you to stop taking them or to reduce your dosage.  Do not take new dietary supplements or medications within 1 week of your procedure unless your caregiver approves them.  Do not eat or drink within 8 hours of your procedure or as directed by your caregiver.  Do not smoke for as long as possible before your procedure. If possible, stop smoking 3 6 weeks before the procedure. PROCEDURE   You will be given a medication that makes you sleep (general anesthetic) through a mask, through an intravenous (IV) access tube, or through both. This medication will prevent you from being alert and feeling pain during the procedure.Once you are asleep, abreathing tube or mask may be used to help you breathe.  A video thoracoscope will be placed in a very small incision (less than an inch wide), so that your caregiver can see into your chest. The picture from inside the chest will be displayed on a television screen.  Other surgical tools will be inserted through the remaining incisions.  One of your lungs will be deflated as the surgery is performed. Deflating a lung makes it easier for your caregiver to see the area. The lung will be inflated at the end of the procedure.  The tools will be removed when your caregiver has finished performing the examination or procedure.  A chest  tube will be inserted through an incision to drain the wound. The remaining incisions will be closed with stitches or staples. AFTER THE PROCEDURE  Chest tubes are watched closely for signs of fluid or air buildup in the lungs. Your caregiver will most likely remove them prior to discharge.  You will need to stay in the hospital for 1 5  days. The average time in the hospital with VATS surgery is usually several days less than with standard open surgery.  You may receive medications to help with pain.  It is normal to feel sore for about 2 weeks after the procedure. Document Released: 08/16/2012 Document Reviewed: 08/16/2012 [facility] Patient Information 2014 [place], [place].

## 2013-06-10 ENCOUNTER — Inpatient Hospital Stay (HOSPITAL_COMMUNITY): Payer: Medicaid Other

## 2013-06-10 NOTE — Progress Notes (Signed)
Fentanyl PCA  D/c, wasted 19 cc in sink, waste by Kerry Kass RN & verified by Pecolia Ades RN

## 2013-06-10 NOTE — Progress Notes (Signed)
Utilization review completed.  

## 2013-06-10 NOTE — Progress Notes (Addendum)
      [address]       [place] [address]             [phone]       4 Days Post-Op Procedure(s) (LRB): VIDEO ASSISTED THORACOSCOPY (VATS)/WEDGE RESECTION (Right) LOBECTOMY (Right)  Subjective: Patient's with some back pain and some abdominal discomfort, although less than yesterday as had a bowel movement. Patient states back pain is likely from lying in soft bed. Denies nausea or emesis.  Objective: Vital signs in last 24 hours: Temp:  [97.6 F (36.4 C)-99.1 F (37.3 C)] 97.6 F (36.4 C) (02/06 0400) Pulse Rate:  [58-70] 70 (02/06 0400) Cardiac Rhythm:  [-] Normal sinus rhythm (02/06 0400) Resp:  [12-15] 15 (02/06 0400) BP: (85-117)/(54-101) 110/69 mmHg (02/06 0400) SpO2:  [95 %-96 %] 95 % (02/06 0400)     Intake/Output from previous day: 02/05 0701 - 02/06 0700 In: 1320 [P.O.:1320] Out: 1650 [Urine:1350; Chest Tube:300]   Physical Exam:  Cardiovascular: RRR. Pulmonary: Diminished breath sounds R>L; no rales, wheezes, or rhonchi. Abdomen: Soft, non tender,bowel sounds present. Extremities: No lower extremity edema. Wounds: Clean and dry.  No erythema or signs of infection. Chest Tube: to water seal  Lab Results: CBC:  Recent Labs  06/08/13 0410 06/09/13 0315  WBC 4.3 4.0  HGB 10.4* 10.6*  HCT 31.4* 32.2*  PLT 101* 120*   BMET:   Recent Labs  06/08/13 0410 06/09/13 0315  NA 130* 130*  K 4.2 4.1  CL 95* 93*  CO2 25 27  GLUCOSE 96 96  BUN 5* 5*  CREATININE 0.61 0.66  CALCIUM 8.0* 8.4    PT/INR: No results found for this basename: LABPROT, INR,  in the last 72 hours ABG:  INR: Will add last result for INR, ABG once components are confirmed Will add last 4 CBG results once components are confirmed  Assessment/Plan:  1. CV - SR. On Bisprolol with parameters. 2.  Pulmonary - Chest tube with 300 cc of output last 24 hours. Chest tube is to water seal. There is some tidling in the chest tube with cough, but no obvious air  leak. CXR shows patient is slightly rotated to the left, persistent right  pneumothorax, and bibasilar atelectasis/ small effusions.Will discuss management of ct with surgeon. Encourage incentive spirometer and flutter valve.Final pathology showed SCC.TNM code: pT1b , pN0 3.ABL anemia-H and H stable at 10.6 and 32.2 4.Thrombocytopenia-platelets up to 120,000 5. Hyponatremia-sodium yesterday130   ZIMMERMAN,DONIELLE MPA-C 06/10/2013,7:28 AM  Patient seen and examined, agree with above Dc ct Dc PCA after CT out Possibly home tomorrow

## 2013-06-11 ENCOUNTER — Inpatient Hospital Stay (HOSPITAL_COMMUNITY): Payer: Medicaid Other

## 2013-06-11 LAB — BASIC METABOLIC PANEL
BUN: 6 mg/dL (ref 6–23)
CALCIUM: 8.6 mg/dL (ref 8.4–10.5)
CO2: 28 meq/L (ref 19–32)
Chloride: 96 mEq/L (ref 96–112)
Creatinine, Ser: 0.8 mg/dL (ref 0.50–1.35)
GFR calc Af Amer: >90 mL/min (ref >90)
GFR calc non Af Amer: >90 mL/min (ref >90)
GLUCOSE: 92 mg/dL (ref 70–99)
POTASSIUM: 4 meq/L (ref 3.7–5.3)
SODIUM: 133 meq/L — AB (ref 137–147)

## 2013-06-11 NOTE — Progress Notes (Addendum)
       [address]       [place] [address]             [phone]          5 Days Post-Op Procedure(s) (LRB): VIDEO ASSISTED THORACOSCOPY (VATS)/WEDGE RESECTION (Right) LOBECTOMY (Right)  Subjective: Feels tired, gets winded easily.   Objective: Vital signs in last 24 hours: Patient Vitals for the past 24 hrs:  BP Temp Temp src Pulse Resp SpO2  06/11/13 0818 - 98 F (36.7 C) Oral - - -  06/11/13 0428 97/60 mmHg 97.5 F (36.4 C) Oral 68 17 96 %  06/11/13 0120 95/52 mmHg - - - - -  06/11/13 0103 79/49 mmHg 97.5 F (36.4 C) Oral 58 17 95 %  06/10/13 2005 85/49 mmHg 98.1 F (36.7 C) Oral 61 12 98 %  06/10/13 1547 113/64 mmHg 98.3 F (36.8 C) Oral 78 19 94 %  06/10/13 1133 108/67 mmHg 98.7 F (37.1 C) Oral 73 20 96 %   Current Weight  06/08/13 162 lb 4.1 oz (73.6 kg)     Intake/Output from previous day: 02/06 0701 - 02/07 0700 In: 1704 [P.O.:1680; I.V.:24] Out: 1075 [Urine:1075]    PHYSICAL EXAM:  Heart: RRR Lungs: Decreased BS in bases Wound: Clean and dry     Lab Results: CBC: Recent Labs  06/09/13 0315  WBC 4.0  HGB 10.6*  HCT 32.2*  PLT 120*   BMET:  Recent Labs  06/09/13 0315 06/11/13 0246  NA 130* 133*  K 4.1 4.0  CL 93* 96  CO2 27 28  GLUCOSE 96 92  BUN 5* 6  CREATININE 0.66 0.80  CALCIUM 8.4 8.6    PT/INR: No results found for this basename: LABPROT, INR,  in the last 72 hours  CXR: FINDINGS:  A right apical pneumothorax is again identified and stable in  appearance. The left lung is well-aerated. Persistent right basilar  changes are noted. The cardiac shadow is within normal limits. No  bony abnormality is seen.  IMPRESSION:  Stable right apical pneumothorax and right basilar infiltrative  changes.   Assessment/Plan: S/P Procedure(s) (LRB): VIDEO ASSISTED THORACOSCOPY (VATS)/WEDGE RESECTION (Right) LOBECTOMY (Right) CXR with stable R apical ptx. SBPs low this am- pt states that he was told at  [facility] to d/c all BP meds.  He is still getting Zebeta.  Will d/c for now. Probably need to watch one more day- possibly home in am.   LOS: 5 days    COLLINS,GINA H 06/11/2013  I have seen and examined the patient and agree with the assessment and plan as outlined.  OWEN,CLARENCE H 06/11/2013 11:40 AM

## 2013-06-12 ENCOUNTER — Inpatient Hospital Stay (HOSPITAL_COMMUNITY): Payer: Medicaid Other

## 2013-06-12 MED ORDER — OXYCODONE-ACETAMINOPHEN 5-325 MG PO TABS: 1.0000 | ORAL_TABLET | ORAL | Status: DC | PRN

## 2013-06-12 NOTE — Progress Notes (Addendum)
       [address]       [place] [address]             [phone]          6 Days Post-Op Procedure(s) (LRB): VIDEO ASSISTED THORACOSCOPY (VATS)/WEDGE RESECTION (Right) LOBECTOMY (Right)  Subjective: Feels tired, short winded this am.    Objective: Vital signs in last 24 hours: Patient Vitals for the past 24 hrs:  BP Temp Temp src Pulse Resp SpO2 Weight  06/12/13 0800 112/63 mmHg - - 77 19 94 % -  06/12/13 0357 94/55 mmHg 97.8 F (36.6 C) Oral 71 18 93 % 163 lb 12.8 oz (74.3 kg)  06/11/13 2340 103/59 mmHg 97.2 F (36.2 C) Oral 78 24 91 % -  06/11/13 2200 93/69 mmHg - - 49 20 97 % -  06/11/13 2100 108/79 mmHg - - 86 24 96 % -  06/11/13 2011 122/76 mmHg 98.1 F (36.7 C) Oral 79 24 96 % -  06/11/13 2000 - - - 80 14 96 % -  06/11/13 1900 - - - 76 22 96 % -  06/11/13 1800 - - - 71 20 96 % -  06/11/13 1700 - - - 71 22 100 % -  06/11/13 1640 93/59 mmHg - - 66 21 98 % -  06/11/13 1600 - 97.7 F (36.5 C) Oral 64 17 100 % -  06/11/13 1500 - - - 58 19 99 % -  06/11/13 1148 100/61 mmHg 100.6 F (38.1 C) Oral 64 22 100 % -   Current Weight  06/12/13 163 lb 12.8 oz (74.3 kg)     Intake/Output from previous day: 02/07 0701 - 02/08 0700 In: 450 [P.O.:450] Out: 1075 [Urine:1075]    PHYSICAL EXAM:  Heart: RRR Lungs: Diminished BS in bases bilaterally Wound: Clean and dry     Lab Results: CBC:No results found for this basename: WBC, HGB, HCT, PLT,  in the last 72 hours BMET:  Recent Labs  06/11/13 0246  NA 133*  K 4.0  CL 96  CO2 28  GLUCOSE 92  BUN 6  CREATININE 0.80  CALCIUM 8.6    PT/INR: No results found for this basename: LABPROT, INR,  in the last 72 hours    Assessment/Plan: S/P Procedure(s) (LRB): VIDEO ASSISTED THORACOSCOPY (VATS)/WEDGE RESECTION (Right) LOBECTOMY (Right) Hypotension- BPs improved with d/c beta blocker.  Continue to watch. CXR pending this am. Will follow up to recheck apical ptx/space. Hopefully home  soon.   LOS: 6 days    COLLINS,GINA H 06/12/2013  I have seen and examined the patient and agree with the assessment and plan as outlined.  Feels well and wants to go home.  Deann Mclaine H 06/12/2013 11:33 AM

## 2013-06-12 NOTE — Progress Notes (Signed)
06/12/2013 12:47 PM IV discontinued. Discharge instructions reviewed with patient, patient verbalized understanding.  Patient ready to be discharged.   Tilda Burrow [place]

## 2013-06-13 ENCOUNTER — Telehealth: Payer: Self-pay | Admitting: *Deleted

## 2013-06-13 NOTE — Telephone Encounter (Signed)
Called patient to see how he is doing s/p lung surgery last week.  LVM with request to call me as he is able.  Continuing to navigate as L3 (treatments completed) patient.  [name], RN, BSN, [facility] Navigator [phone]

## 2013-06-16 ENCOUNTER — Encounter: Payer: Self-pay | Admitting: *Deleted

## 2013-06-16 NOTE — Progress Notes (Signed)
Called patient s/p lung surgery last week to see how he's been.  He stated he is tired, experiencing surgery-related pain that resolves with Percocet.  He stated he expects to be able to keep his 11:00 lab appt and 11:20 appt with Dr. Isidore Moos tomorrow.  I indicated I would see him then.  Continuing to navigate as L3 (treatments completed) patient.  [name], RN, BSN, [facility] Navigator [phone]

## 2013-06-17 ENCOUNTER — Ambulatory Visit
Admission: RE | Admit: 2013-06-17 | Discharge: 2013-06-17 | Disposition: A | Discharge status: Home / Self Care | Payer: Medicaid Other | Source: Ambulatory Visit | Attending: Radiation Oncology | Admitting: Radiation Oncology

## 2013-06-17 VITALS — BP 104/70 | HR 85 | Temp 97.5°F | Ht 68.0 in | Wt 152.5 lb

## 2013-06-17 DIAGNOSIS — R918 Other nonspecific abnormal finding of lung field: Secondary | ICD-10-CM

## 2013-06-17 DIAGNOSIS — C023 Malignant neoplasm of anterior two-thirds of tongue, part unspecified: Secondary | ICD-10-CM

## 2013-06-17 DIAGNOSIS — C029 Malignant neoplasm of tongue, unspecified: Secondary | ICD-10-CM

## 2013-06-17 LAB — BUN AND CREATININE (CC13)
BUN: 7.9 mg/dL (ref 7.0–26.0)
Creatinine: 0.8 mg/dL (ref 0.7–1.3)

## 2013-06-17 LAB — TSH CHCC: TSH: 4.922 m(IU)/L — ABNORMAL HIGH (ref 0.320–4.118)

## 2013-06-17 MED ORDER — OXYCODONE-ACETAMINOPHEN 5-325 MG PO TABS: 1.0000 | ORAL_TABLET | ORAL | Status: DC | PRN

## 2013-06-17 MED ORDER — TRAMADOL HCL 50 MG PO TABS: 50.0000 mg | ORAL_TABLET | Freq: Four times a day (QID) | ORAL | Status: DC | PRN

## 2013-06-17 NOTE — Progress Notes (Signed)
Radiation Oncology         [phone] ________________________________  Name: Trevor Griffin MRN: [id number]  Date: 06/17/2013  DOB: 15-Aug-1955  Follow-Up Visit Note  CC: Scarlette Calico, MD  Philomena Doheny, MD  Diagnosis and Prior Radiotherapy: Pathologic T3 N1 squamous cell carcinoma; right oral tongue; grade 2  Indication for treatment: Postoperative, curative  Radiation treatment dates: 10/25/2012-12/10/2012  Site/dose: Right Tongue/ Bilateral Neck /// Total Dose, 60 Gy in 30 Fractions   Narrative:  The patient returns today for routine follow-up.    He underwent VIDEO ASSISTED THORACOSCOPY (VATS)/WEDGE RESECTION (Right)  LOBECTOMY (Right) on 06-08-13; path revealed a 3cm squamous cell carcinoma of the RLL, margins negative, 5 lymph nodes negative. RUL wedge was benign.  This might be a primary lung cancer. I have put his case on for tumor board review.  I discussed the results with him and his wife. He reports post operative chest wall pain. He stopped smoking and drinks beer occasionally.   ALLERGIES:  is allergic to nubain and hydromorphone.  Meds: Current Outpatient Prescriptions  Medication Sig Dispense Refill  . aspirin 325 MG tablet Take 1 tablet (325 mg total) by mouth daily.  30 tablet  2  . atorvastatin (LIPITOR) 80 MG tablet Take 1 tablet (80 mg total) by mouth daily.  30 tablet  2  . buPROPion (WELLBUTRIN SR) 150 MG 12 hr tablet Take 150 mg by mouth 2 (two) times daily.      Marland Kitchen LORazepam (ATIVAN) 2 MG tablet Take 1 tablet (2 mg total) by mouth every 8 (eight) hours as needed for anxiety.  45 tablet  0  . Multiple Vitamins-Minerals (MULTIVITAMIN WITH MINERALS) tablet Take 1 tablet by mouth daily.        . nitroGLYCERIN (NITROSTAT) 0.4 MG SL tablet Place 1 tablet (0.4 mg total) under the tongue every 5 (five) minutes as needed (up to 3 doses). For chest pain      . oxyCODONE-acetaminophen (PERCOCET/ROXICET) 5-325 MG per tablet Take 1-2 tablets by mouth every 4 (four) hours  as needed for severe pain.  90 tablet  0  . traMADol (ULTRAM) 50 MG tablet Take 1 tablet (50 mg total) by mouth every 6 (six) hours as needed for moderate pain.  90 tablet  0  . traZODone (DESYREL) 50 MG tablet Take 1 tablet (50 mg total) by mouth at bedtime.  90 tablet  3   No current facility-administered medications for this encounter.    Physical Findings: The patient is in no acute distress. Patient is alert and oriented.  height is _0  (1.727 m) and weight is 152 lb 8 oz (69.174 kg). His temperature is 97.5 F (36.4 C). His blood pressure is 104/70 and his pulse is 85. His oxygen saturation is 99%. . Tongue - post surgical changes with sub centimeter firm nodule on posterior oral tongue (dorsal).  No palpable adenopathy in SCV or cervical neck. Bandaging over right chest from lung surgery.  Lab Findings: Lab Results  Component Value Date   WBC 4.0 06/09/2013   HGB 10.6* 06/09/2013   HCT 32.2* 06/09/2013   MCV 91.7 06/09/2013   PLT 120* 06/09/2013   CMP     Component Value Date/Time   NA 133* 06/11/2013 0246   K 4.0 06/11/2013 0246   CL 96 06/11/2013 0246   CO2 28 06/11/2013 0246   GLUCOSE 92 06/11/2013 0246   BUN 7.9 06/17/2013 1141   BUN 6 06/11/2013 0246   CREATININE  0.8 06/17/2013 1141   CREATININE 0.80 06/11/2013 0246   CREATININE 0.86 01/21/2012 1418   CALCIUM 8.6 06/11/2013 0246   PROT 5.8* 06/08/2013 0410   ALBUMIN 2.6* 06/08/2013 0410   AST 26 06/08/2013 0410   ALT 20 06/08/2013 0410   ALKPHOS 75 06/08/2013 0410   BILITOT 0.6 06/08/2013 0410   GFRNONAA >90 06/11/2013 0246   GFRAA >90 06/11/2013 0246     Lab Results  Component Value Date   TSH 4.922* 06/17/2013    Radiographic Findings: Dg Chest 2 View  06/11/2013   CLINICAL DATA:  Status post chest tube rim  EXAM: CHEST  2 VIEW  COMPARISON:  06/10/2013  FINDINGS: A right apical pneumothorax is again identified and stable in appearance. The left lung is well-aerated. Persistent right basilar changes are noted. The cardiac shadow is within normal  limits. No bony abnormality is seen.  IMPRESSION: Stable right apical pneumothorax and right basilar infiltrative changes.   Electronically Signed   By: Inez Catalina M.D.   On: 06/11/2013 07:15   Dg Chest 2 View  06/06/2013   CLINICAL DATA:  Preop evaluation.  EXAM: CHEST  2 VIEW  COMPARISON:  CT CHEST W/O CM dated 05/09/2013; CT CHEST W/CM dated 03/03/2013; DG CHEST 1V PORT dated 09/28/2012  FINDINGS: The heart size and mediastinal contours are within normal limits. Both lungs are clear. The visualized skeletal structures are unremarkable.  IMPRESSION: No active cardiopulmonary disease.   Electronically Signed   By: Margaree Mackintosh M.D.   On: 06/06/2013 11:16   Dg Chest Port 1 View  06/12/2013   CLINICAL DATA:  Status post right lobectomy, pneumothorax  EXAM: PORTABLE CHEST - 1 VIEW  COMPARISON:  06/11/2013  FINDINGS: Cardiac shadow is stable. Postsurgical changes are again noted on the right with evidence of pneumothorax. The overall appearance is stable from the prior exam. Right basilar infiltrative change is again identified. The left lung remains clear.  IMPRESSION: No change from the prior exam.   Electronically Signed   By: Inez Catalina M.D.   On: 06/12/2013 11:42   Dg Chest Port 1 View  06/10/2013   CLINICAL DATA:  Status post chest tube removal.  EXAM: PORTABLE CHEST - 1 VIEW  COMPARISON:  06/10/2013.  FINDINGS: The cardiac silhouette remains borderline enlarged. The right jugular catheter has been removed. There has been no significant change in a right apical pneumothorax. Patchy opacity and pleural fluid at the right lung base has not changed significantly. The left lung is clear. The pulmonary vasculature and interstitial markings remain prominent. Unremarkable bones.  IMPRESSION: 1. Stable approximately 20% right apical pneumothorax. 2. No significant change in right basilar pleural fluid and patchy atelectasis or pneumonia. 3. Stable pulmonary vascular congestion and borderline cardiomegaly. 4.  Stable mild chronic interstitial lung disease.   Electronically Signed   By: Enrique Sack M.D.   On: 06/10/2013 13:53   Dg Chest Port 1 View  06/10/2013   CLINICAL DATA:  Pneumothorax .  EXAM: PORTABLE CHEST - 1 VIEW  COMPARISON:  DG CHEST 1V PORT dated 06/09/2013  FINDINGS: Right IJ line in stable position. Right chest tube in stable position. Right apical pneumothorax remains unchanged. Persistent atelectasis and/or infiltrates are noted in the lung bases. Heart size and pulmonary vascularity stable with borderline cardiomegaly. No acute osseous abnormality .  IMPRESSION: 1. Line and tube position stable. Right chest tube is in stable position in the medial right lower chest. 2. Stable right apical pneumothorax. 3. Persistent  bibasilar atelectasis and infiltrates.   Electronically Signed   By: Marcello Moores  Register   On: 06/10/2013 07:52   Dg Chest Port 1 View  06/09/2013   CLINICAL DATA:  Post right lower lobectomy  EXAM: PORTABLE CHEST - 1 VIEW  COMPARISON:  Portable exam 0630 hr compared to 06/08/2013  FINDINGS: Right jugular central venous catheter tip projects over cavoatrial junction.  Right basilar thoracostomy tube persists.  Previously seen superior right thoracostomy tube has been removed in the interval since the prior study.  Enlargement of cardiac silhouette.  Prominent mediastinum unchanged.  Persistent right pneumothorax, increased since previous exam.  Persistent infiltrate right lower lobe, slightly increased from previous exam.  Minimal patchy infiltrate left upper and left lower lobes.  Coronary arterial stent noted.  No pleural effusions or acute osseous findings.  IMPRESSION: Persistent right-sided pneumothorax, slightly increased since removal of 1 of 2 thoracostomy tubes.  Increased right lower lobe and developing left lung infiltrates.   Electronically Signed   By: Lavonia Dana M.D.   On: 06/09/2013 08:21   Dg Chest Port 1 View  06/08/2013   CLINICAL DATA:  Post right lower lobectomy.  EXAM:  PORTABLE CHEST - 1 VIEW  COMPARISON:  DG CHEST 1V PORT dated 06/07/2013  FINDINGS: The right chest tubes are stable in position. The right apical pneumothorax has slightly enlarged. Patchy densities in the right lung are suggestive for atelectasis. Left lung is clear without pneumothorax. Stable appearance of the heart and mediastinum. Central venous catheter tip near the superior cavoatrial junction.  IMPRESSION: Mild enlargement of the right apical pneumothorax. Stable position of the right chest tubes.   Electronically Signed   By: Markus Daft M.D.   On: 06/08/2013 07:20   Dg Chest Port 1 View  06/07/2013   CLINICAL DATA:  Right lobectomy. VATS. Primary diagnosis of lung nodule.  EXAM: PORTABLE CHEST - 1 VIEW  COMPARISON:  06/06/2013.  FINDINGS: Dual right thoracostomy tubes are present. Probable faint pleural line overlying the tip of the superior thoracostomy tube compatible with a small pneumothorax, smaller than yesterday. Right IJ central line is present with the tip at the cavoatrial junction. Monitoring leads project over the chest. Surgical staples are present in the right mid to lower lung. Right basilar atelectasis. Left lung appears within normal limits. Cardiopericardial silhouette unchanged.  IMPRESSION: 1. Stable support apparatus. 2. Smaller faintly visualize right apical pneumothorax. 3. Postoperative changes of the right chest without complication.   Electronically Signed   By: Dereck Ligas M.D.   On: 06/07/2013 07:55   Dg Chest Portable 1 View  06/06/2013   CLINICAL DATA:  Lung carcinoma.  Postop from right lower lobectomy.  EXAM: PORTABLE CHEST - 1 VIEW  COMPARISON:  06/06/2013  FINDINGS: Two right-sided chest tubes are now seen in place. A small approximately 10-15% right apical pneumothorax is seen. Mild right basilar atelectasis noted. Left lung is clear.  Heart size remains normal. Right jugular central venous catheter remains in appropriate position.  IMPRESSION: Postop chest, with  small approximately 10-15% right apical pneumothorax.   Electronically Signed   By: Earle Gell M.D.   On: 06/06/2013 17:55   Dg Chest Port 1 View  06/06/2013   CLINICAL DATA:  Line placement.  EXAM: PORTABLE CHEST - 1 VIEW  COMPARISON:  DG CHEST 2 VIEW dated 06/06/2013; CT CHEST W/O CM dated 05/09/2013; DG CHEST 1V PORT dated 09/28/2012  FINDINGS: 1132 hr. Right IJ central venous catheter extends to the SVC right  atrial junction level. There is no pneumothorax. The heart size and mediastinal contours are stable. The lungs are clear. There is no pleural effusion.  IMPRESSION: Central line placement as described.  No evidence of pneumothorax.   Electronically Signed   By: Camie Patience M.D.   On: 06/06/2013 11:44    Impression/Plan:    1) Head and Neck Cancer Status: new lung tumor, resected (new primary vs met? To be reviewed at tumor board).  Query nodule on tongue.  To resume ENT followup.  2) Nutritional Status: no active issues. - PEG tube:removed  3) Risk Factors: The patient has been educated about risk factors including alcohol and tobacco abuse; they understand that avoidance of alcohol and tobacco is important to prevent recurrences as well as other cancers; he stopped smoking recently  with occasional beer drinking continuing  4) Swallowing: functional, no active issues  5) Dental:still using old dentures. Reports he cannot afford new dentures due to insurance issues. Suggested he ask dentist about this at [facility] to be sure. Ill fitting dentures are inadvisable, as we discussed.   6) Energy: slightly elevated TSH. Will check again in 6 wk and if consistent, consider levothyroxine.  7) Social:On Disability. Also, has a young child at home with multiple developmental/behavioral issues.  Social Work Involved.  Family requesting a child psychologist which I think is appropriate. Will alert [name] of Social Work.  8) Follow-up in 6 weeks with CT of Neck and Chest with contrast. The  patient was encouraged to call with any issues or questions before then.   9) OTHER- Query nodule on tongue.  To resume ENT followup. [name] to arrange. Refilled pain meds today, advising him to gradually taper off of these.  I spent 25 minutes minutes face to face with the patient and more than 50% of that time was spent in counseling and/or coordination of care. _____________________________________   [name], MD

## 2013-06-17 NOTE — Progress Notes (Addendum)
Mr. Migues here today for assessment s/p radiation to his right tongue and bilateral neck which completed on 12/10/12. The nodule under his neck is decreasing in size.  His oral mucosa is pink and moist and without any irritation.   He recently had a wedge resection  right lower lobectomy, and a lymph node dissection on 06/06/13.  He currently has a dressing at the surgical site. He c/o pain in the right lateral chest and lower back and c/o of a sore.  His wife reports that he has stopped smoking and only drinks an ocassional beer.

## 2013-06-20 ENCOUNTER — Encounter: Payer: Self-pay | Admitting: Radiation Oncology

## 2013-06-20 ENCOUNTER — Telehealth: Payer: Self-pay | Admitting: *Deleted

## 2013-06-20 NOTE — Telephone Encounter (Signed)
Called patient to inform of lab and test  For 07-28-13, lvm for a return call

## 2013-06-21 ENCOUNTER — Ambulatory Visit: Payer: Medicaid Other | Admitting: Thoracic Surgery (Cardiothoracic Vascular Surgery)

## 2013-06-21 ENCOUNTER — Encounter: Payer: Self-pay | Admitting: *Deleted

## 2013-06-21 NOTE — Progress Notes (Signed)
Met with patient and his wife during scheduled follow-up appt with Dr. Isidore Moos to provide support and maintain care continuity.  Continuing to navigate as L3 (treatments completed) patient.  [name], RN, BSN, [facility] Head & Neck Oncology Navigator [phone]

## 2013-06-22 ENCOUNTER — Encounter: Payer: Self-pay | Admitting: *Deleted

## 2013-06-22 NOTE — Progress Notes (Addendum)
Clinical Social Work received referral from radiation oncologist to provide community resource information regarding child psychologist for patient's son.  CSW attempted to contact patient at home, left voicemail to return call when possible. CSW will await return call.  Patient's spouse returned [place] phone call.  Mrs. Cayton states she is concerned for her son's mental health status and is interested in having him evaluated by a psychologist.  She indicated he is being evaluated by a counselor 06/27/13.  CSW provided patient spouse with information for [facility] and encouraged her to discuss possible services with their practice to determine what is most appropriate to support her son.  Patient's spouse was appreciative of CSW contact and plans to contact CSW with any other questions or concerns.  [name], MSW, LCSW, OSW-C Clinical Social Worker [facility] [phone]

## 2013-06-23 ENCOUNTER — Other Ambulatory Visit: Payer: Self-pay | Admitting: *Deleted

## 2013-06-23 DIAGNOSIS — R918 Other nonspecific abnormal finding of lung field: Secondary | ICD-10-CM

## 2013-06-28 ENCOUNTER — Ambulatory Visit: Payer: Medicaid Other | Admitting: Thoracic Surgery (Cardiothoracic Vascular Surgery)

## 2013-07-05 ENCOUNTER — Encounter: Payer: Self-pay | Admitting: Thoracic Surgery (Cardiothoracic Vascular Surgery)

## 2013-07-05 ENCOUNTER — Ambulatory Visit
Admission: RE | Admit: 2013-07-05 | Discharge: 2013-07-05 | Disposition: A | Discharge status: Home / Self Care | Payer: Medicaid Other | Source: Ambulatory Visit | Attending: Thoracic Surgery (Cardiothoracic Vascular Surgery) | Admitting: Thoracic Surgery (Cardiothoracic Vascular Surgery)

## 2013-07-05 ENCOUNTER — Ambulatory Visit (INDEPENDENT_AMBULATORY_CARE_PROVIDER_SITE_OTHER): Payer: Medicaid Other | Admitting: Thoracic Surgery (Cardiothoracic Vascular Surgery)

## 2013-07-05 VITALS — BP 112/87 | HR 84 | Resp 20 | Ht 62.0 in | Wt 158.0 lb

## 2013-07-05 DIAGNOSIS — R222 Localized swelling, mass and lump, trunk: Secondary | ICD-10-CM

## 2013-07-05 DIAGNOSIS — Z09 Encounter for follow-up examination after completed treatment for conditions other than malignant neoplasm: Secondary | ICD-10-CM

## 2013-07-05 DIAGNOSIS — C3490 Malignant neoplasm of unspecified part of unspecified bronchus or lung: Secondary | ICD-10-CM

## 2013-07-05 DIAGNOSIS — R918 Other nonspecific abnormal finding of lung field: Secondary | ICD-10-CM

## 2013-07-05 DIAGNOSIS — C023 Malignant neoplasm of anterior two-thirds of tongue, part unspecified: Secondary | ICD-10-CM

## 2013-07-05 DIAGNOSIS — Z9889 Other specified postprocedural states: Secondary | ICD-10-CM

## 2013-07-05 DIAGNOSIS — Z902 Acquired absence of lung [part of]: Secondary | ICD-10-CM

## 2013-07-05 MED ORDER — OXYCODONE-ACETAMINOPHEN 5-325 MG PO TABS: 1.0000 | ORAL_TABLET | Freq: Four times a day (QID) | ORAL | Status: DC | PRN

## 2013-07-05 NOTE — Progress Notes (Signed)
HPI:  Trevor Griffin returns today for a scheduled postoperative followup visit.  He is 58 year old smoker who had a history of a tongue cancer. A small right lower lobe nodule was found on chest CT. It grew on followup and he was referred for surgical resection. He underwent a thoracoscopic right lower lobectomy on February 2. It turned out to be a 3 cm squamous cell carcinoma (T1 B., N0, stage Ia). He cannot be definitively determine whether this was a primary lung cancer or metastatic disease from his tongue primary.  His postoperative course was unremarkable he was discharged on postoperative day #6.  Since discharge he has been having difficulty with pain. He does have some incisional pain. He says that the oxycodone/APAP 10/325 worked better than taking two 5/325 tablets. He has been smoking "a few cigarettes a day"  His appetite has been poor.  Past Medical History  Diagnosis Date  . History of tobacco abuse     quit Oct '12  . Hyperlipidemia   . Ischemic cardiomyopathy     Echo 08/20/11 with EF 50-55% (previously 35%)  . Hypertension   . Obesity   . Depression   . CAD (coronary artery disease)     Ant STEMI 5/08 with 4 Taxus DES proximal to mid LAD. 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 Cath in 2/09 also showed 80% mid to distal CFX stenosis. Myoview (6/11): EF 37%, large anteroapical infarct with no ischemia. [place] 10/12: LHC with prox to mid LAD stent ok with 10-20% ISR, mLAD 40%, CFX 30-40%, mCFX 90%, EF 35% - DES to mCFX  . GERD (gastroesophageal reflux disease)   . COPD (chronic obstructive pulmonary disease)   . Alcohol abuse   . Fatigue     sleep test neg 2008  . Anterior myocardial infarction 2008  . Palpitations     For event monitor 01/2012  . Tongue cancer   . Hx of radiation therapy 10/25/12-12/10/12     Right Tongue/ Bilateral Neck //Total Dose, 60 Gy in 30 Fractions  . Sleep apnea     not on CPAP  .  Headache(784.0)   . Anxiety      Current Outpatient Prescriptions  Medication Sig Dispense Refill  . aspirin 325 MG tablet Take 1 tablet (325 mg total) by mouth daily.  30 tablet  2  . atorvastatin (LIPITOR) 80 MG tablet Take 1 tablet (80 mg total) by mouth daily.  30 tablet  2  . buPROPion (WELLBUTRIN SR) 150 MG 12 hr tablet Take 150 mg by mouth 2 (two) times daily.      Marland Kitchen LORazepam (ATIVAN) 2 MG tablet Take 1 tablet (2 mg total) by mouth every 8 (eight) hours as needed for anxiety.  45 tablet  0  . Multiple Vitamins-Minerals (MULTIVITAMIN WITH MINERALS) tablet Take 1 tablet by mouth daily.        . nitroGLYCERIN (NITROSTAT) 0.4 MG SL tablet Place 1 tablet (0.4 mg total) under the tongue every 5 (five) minutes as needed (up to 3 doses). For chest pain      . oxyCODONE-acetaminophen (PERCOCET/ROXICET) 5-325 MG per tablet Take 1-2 tablets by mouth every 6 (six) hours as needed for severe pain.  90 tablet  0  . traMADol (ULTRAM) 50 MG tablet Take 1 tablet (50 mg total) by mouth every 6 (six) hours as needed for moderate pain.  90 tablet  0  . traZODone (DESYREL) 50 MG  tablet Take 1 tablet (50 mg total) by mouth at bedtime.  90 tablet  3   No current facility-administered medications for this visit.    Physical Exam BP 112/87  Pulse 84  Resp 20  Ht 5\' 2"  (1.575 m)  Wt 158 lb (71.668 kg)  BMI 28.89 kg/m2  SpO12 50% 58 year old male in no acute distress HEENT postop deformity unchanged Lungs diminished breath sounds right base otherwise clear Cardiac regular rate and rhythm normal S1 and S2 Incisions clean dry and intact and healing well No peripheral edema  Diagnostic Tests: Chest x-ray 07/05/2013 CLINICAL DATA: Status post lobectomy. Followup study.  EXAM:  CHEST 2 VIEW  COMPARISON: Chest x-ray 06/12/2013.  FINDINGS:  Postoperative changes of right lower lobectomy. Previously noted  pneumothorax is no longer identified. There is a moderate  right-sided pleural effusion,  partially loculated in the right apex.  Continued re-expansion of the right upper and middle lobes is noted.  Left lung is clear. No consolidative airspace disease. No evidence  of pulmonary edema. Heart size is within normal limits. Coronary  artery stent (likely within the LAD) incidentally noted.  IMPRESSION:  1. Resolution of previously noted pneumothorax.  2. Moderate partially loculated pleural effusion in the right  hemithorax status post right lower lobectomy.  Electronically Signed  By: Vinnie Langton M.D.  On: 07/05/2013 14:47  Impression: 58 year old male with a history of tongue cancer who underwent resection of a squamous cell carcinoma the right lower lobe. I think this most likely was a second primary although it cannot be definitively ruled out that was a squamous cell metastasis. His case will be presented at tumor Board. It has been presented at our multidisciplinary thoracic oncology conference and was felt that no additional treatment was needed at this time.  He is recovering well from his surgery. He still has some incisional pain which is not surprising. I will keep him on the Percocet 5/325 one to 2 tablets 4 times daily as needed for pain. I encouraged him to use one tablet and only uses second of these really having particularly severe pain. He is in need to work himself off of this eventually. He does have a history of alcohol abuse.  I'm disappointed that he's been smoking. I had an extensive discussion with him about the importance of tobacco cessation given his 2 cancers. I am not optimistic that he will quit altogether.  Plan: Return in 2 months with PA and lateral chest x-ray.

## 2013-07-21 ENCOUNTER — Telehealth: Payer: Self-pay | Admitting: *Deleted

## 2013-07-22 ENCOUNTER — Telehealth: Payer: Self-pay | Admitting: *Deleted

## 2013-07-22 NOTE — Telephone Encounter (Signed)
Pt wife called stating patient continues to feel weak and wd like appt with Dr. Isidore Moos for evaluation.  She also stated that pt needs refill Rx for Percocet and Ativan.  Requests relayed to Dr. Isidore Moos via [place].  Continuing to navigate as L3 (treatments completed) patient.  [name], RN, BSN, [facility] Head & Neck Oncology Navigator [phone]

## 2013-07-28 ENCOUNTER — Ambulatory Visit: Payer: Medicaid Other

## 2013-07-28 ENCOUNTER — Ambulatory Visit (HOSPITAL_COMMUNITY): Payer: MEDICAID

## 2013-07-29 ENCOUNTER — Ambulatory Visit: Admission: RE | Admit: 2013-07-29 | Payer: MEDICAID | Source: Ambulatory Visit | Admitting: Radiation Oncology

## 2013-07-30 NOTE — Telephone Encounter (Signed)
Spoke with patient in follow-up to his need for Ativan and Percocet refills.  He stated he had sufficient tablets to manage symptoms until he sees Dr. Isidore Moos next week.  He stated he also has Tramadol available though it usually doesn't help well with pain controll; I encourage him to continue this as an option.  He verbalized understanding.  [name], RN, BSN, [facility] Head & Neck Oncology Navigator [phone]

## 2013-08-09 ENCOUNTER — Encounter (HOSPITAL_COMMUNITY): Payer: Self-pay

## 2013-08-09 ENCOUNTER — Ambulatory Visit (HOSPITAL_COMMUNITY)
Admission: RE | Admit: 2013-08-09 | Discharge: 2013-08-09 | Disposition: A | Discharge status: Home / Self Care | Payer: Medicaid Other | Source: Ambulatory Visit | Attending: Radiation Oncology | Admitting: Radiation Oncology

## 2013-08-09 DIAGNOSIS — I7090 Unspecified atherosclerosis: Secondary | ICD-10-CM | POA: Insufficient documentation

## 2013-08-09 DIAGNOSIS — I2584 Coronary atherosclerosis due to calcified coronary lesion: Secondary | ICD-10-CM | POA: Insufficient documentation

## 2013-08-09 DIAGNOSIS — C023 Malignant neoplasm of anterior two-thirds of tongue, part unspecified: Secondary | ICD-10-CM

## 2013-08-09 DIAGNOSIS — C029 Malignant neoplasm of tongue, unspecified: Secondary | ICD-10-CM | POA: Insufficient documentation

## 2013-08-09 DIAGNOSIS — J90 Pleural effusion, not elsewhere classified: Secondary | ICD-10-CM | POA: Insufficient documentation

## 2013-08-09 DIAGNOSIS — K7689 Other specified diseases of liver: Secondary | ICD-10-CM | POA: Insufficient documentation

## 2013-08-09 DIAGNOSIS — R918 Other nonspecific abnormal finding of lung field: Secondary | ICD-10-CM | POA: Insufficient documentation

## 2013-08-09 DIAGNOSIS — Z923 Personal history of irradiation: Secondary | ICD-10-CM | POA: Insufficient documentation

## 2013-08-09 MED ORDER — IOHEXOL 300 MG/ML  SOLN
100.0000 mL | Freq: Once | INTRAMUSCULAR | Status: AC | PRN
  Administered 2013-08-09: 100 mL via INTRAVENOUS

## 2013-08-10 ENCOUNTER — Other Ambulatory Visit: Payer: Self-pay | Admitting: Radiation Oncology

## 2013-08-10 ENCOUNTER — Encounter: Payer: Self-pay | Admitting: *Deleted

## 2013-08-10 ENCOUNTER — Encounter: Payer: Self-pay | Admitting: Radiation Oncology

## 2013-08-10 ENCOUNTER — Ambulatory Visit
Admission: RE | Admit: 2013-08-10 | Discharge: 2013-08-10 | Disposition: A | Discharge status: Home / Self Care | Payer: Medicaid Other | Source: Ambulatory Visit | Attending: Radiation Oncology | Admitting: Radiation Oncology

## 2013-08-10 VITALS — BP 113/67 | HR 93 | Temp 97.7°F | Ht 62.0 in | Wt 155.2 lb

## 2013-08-10 DIAGNOSIS — R918 Other nonspecific abnormal finding of lung field: Secondary | ICD-10-CM

## 2013-08-10 DIAGNOSIS — F17200 Nicotine dependence, unspecified, uncomplicated: Secondary | ICD-10-CM

## 2013-08-10 DIAGNOSIS — I2510 Atherosclerotic heart disease of native coronary artery without angina pectoris: Secondary | ICD-10-CM

## 2013-08-10 DIAGNOSIS — C023 Malignant neoplasm of anterior two-thirds of tongue, part unspecified: Secondary | ICD-10-CM

## 2013-08-10 DIAGNOSIS — J189 Pneumonia, unspecified organism: Secondary | ICD-10-CM

## 2013-08-10 DIAGNOSIS — C029 Malignant neoplasm of tongue, unspecified: Secondary | ICD-10-CM

## 2013-08-10 DIAGNOSIS — F411 Generalized anxiety disorder: Secondary | ICD-10-CM

## 2013-08-10 MED ORDER — LORAZEPAM 2 MG PO TABS: 2.0000 mg | ORAL_TABLET | Freq: Three times a day (TID) | ORAL | Status: DC | PRN

## 2013-08-10 MED ORDER — ASPIRIN 325 MG PO TABS: 325.0000 mg | ORAL_TABLET | Freq: Every day | ORAL | Status: DC

## 2013-08-10 MED ORDER — NICOTINE 10 MG IN INHA: 1.0000 | RESPIRATORY_TRACT | Status: DC | PRN

## 2013-08-10 MED ORDER — OXYCODONE-ACETAMINOPHEN 5-325 MG PO TABS: 1.0000 | ORAL_TABLET | Freq: Four times a day (QID) | ORAL | Status: DC | PRN

## 2013-08-10 MED ORDER — AZITHROMYCIN 500 MG PO TABS: 500.0000 mg | ORAL_TABLET | Freq: Every day | ORAL | Status: DC

## 2013-08-10 NOTE — Progress Notes (Signed)
Radiation Oncology         [phone] ________________________________  Name: Trevor Griffin MRN: [id number]  Date: 08/10/2013  DOB: 03/13/1956  Follow-Up Visit Note  CC: Scarlette Calico, MD  Philomena Doheny, MD  Diagnosis and Prior Radiotherapy:  Pathologic T3 N1 squamous cell carcinoma; right oral tongue; grade 2  Indication for treatment: Postoperative, curative  Radiation treatment dates: 10/25/2012-12/10/2012  Site/dose: Right Tongue/ Bilateral Neck /// Total Dose, 60 Gy in 30 Fractions   pT1b , pN0 Right lower lobe squamous cell carcinoma, sp wedge resection on 06-06-13   Narrative:  The patient returns today for routine follow-up.  Reports chronic pain in joints - unrelated to tongue or lung cancer treatments - and requesting medication refills for percocet, lorazepam.  Still smoking.  Wife reports his chest "rattles" in the AM and when he is lying down.  Has declined PT for lymphedema in neck but has persistent swelling. CT results reviewed with him and his wife today.                            ALLERGIES:  is allergic to nubain and hydromorphone.  Meds: Current Outpatient Prescriptions  Medication Sig Dispense Refill  . atorvastatin (LIPITOR) 80 MG tablet Take 1 tablet (80 mg total) by mouth daily.  30 tablet  2  . buPROPion (WELLBUTRIN SR) 150 MG 12 hr tablet Take 150 mg by mouth 2 (two) times daily.      . Multiple Vitamins-Minerals (MULTIVITAMIN WITH MINERALS) tablet Take 1 tablet by mouth daily.        . nitroGLYCERIN (NITROSTAT) 0.4 MG SL tablet Place 1 tablet (0.4 mg total) under the tongue every 5 (five) minutes as needed (up to 3 doses). For chest pain      . traMADol (ULTRAM) 50 MG tablet Take 1 tablet (50 mg total) by mouth every 6 (six) hours as needed for moderate pain.  90 tablet  0  . traZODone (DESYREL) 50 MG tablet Take 1 tablet (50 mg total) by mouth at bedtime.  90 tablet  3  . aspirin 325 MG tablet Take 1 tablet (325 mg total) by mouth daily.  30 tablet  1  .  azithromycin (ZITHROMAX) 500 MG tablet Take 1 tablet (500 mg total) by mouth daily.  3 tablet  0  . LORazepam (ATIVAN) 2 MG tablet Take 1 tablet (2 mg total) by mouth every 8 (eight) hours as needed for anxiety.  45 tablet  0  . nicotine (NICOTROL) 10 MG inhaler Inhale 1 cartridge (1 continuous puffing total) into the lungs as needed for smoking cessation. Best effect observed with frequent, continuous puffing over 20 minute periods throughout the day; 6 or more cartridges/day PRN [4 mg/cartridge]; do not exceed 16 cartridges/day; Use at least 6 cartridges/day for the first 3-6 weeks for improved success at quiting  42 each  5  . oxyCODONE-acetaminophen (PERCOCET/ROXICET) 5-325 MG per tablet Take 1-2 tablets by mouth every 6 (six) hours as needed for severe pain.  90 tablet  0   No current facility-administered medications for this encounter.    Physical Findings: The patient is in no acute distress. Patient is alert and oriented.  height is 5\' 2"  (1.575 m) and weight is 155 lb 3.2 oz (70.398 kg). His temperature is 97.7 F (36.5 C). His blood pressure is 113/67 and his pulse is 93. His oxygen saturation is 99%. . No concerning oral lesions, Mouth somewhat  dry. 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 No palpable neck nodes. Lungs CTAB but decreased sounds in RLL region. Heart RRR.   Lab Findings: Lab Results  Component Value Date   WBC 4.0 06/09/2013   HGB 10.6* 06/09/2013   HCT 32.2* 06/09/2013   MCV 91.7 06/09/2013   PLT 120* 06/09/2013    Lab Results  Component Value Date   TSH 4.922* 06/17/2013    Radiographic Findings: Ct Soft Tissue Neck W Contrast  08/09/2013   CLINICAL DATA:  58 year old male with oral tongue carcinoma status post hemiglossectomy and neck dissection. Status post radiation. Right lower lobe lung nodule. Restaging. Initial encounter.  EXAM: CT NECK WITH CONTRAST  TECHNIQUE: Multidetector CT imaging of the neck was performed using the standard protocol following the bolus administration  of intravenous contrast.  CONTRAST:  135mL OMNIPAQUE IOHEXOL 300 MG/ML SOLN in conjunction with contrast enhanced imaging of the chest reported separately.  COMPARISON:  Neck CT 03/03/2013.  FINDINGS: Chest findings today are reported separately.  Sequelae of partial glossectomy. Continued diffuse pharyngeal mucosal space soft tissue thickening compatible with sequelae of XRT. Oral tongue and right submandibular space surgical clips are stable. Small right carotid space/retropharyngeal effusion is not significantly changed. No residual tongue or other pharyngeal mucosal space mass. No cervical lymphadenopathy.  Major vascular structures in the neck and at the skullbase are patent. Multifocal calcified atherosclerotic plaque. Grossly negative visible brain parenchyma. Visualized orbit soft tissues are within normal limits. Left maxillary sinus mucous retention cyst. Improved paranasal sinus aeration. Chronic mastoid effusions with Stable partial opacification of the left middle ear.  No acute osseous abnormality identified.  Stable thyroid with subcentimeter coarse calcification in the right lobe.  IMPRESSION: 1. Stable and satisfactory posttherapy appearance of the neck. 2. Chest findings today are reported separately. 3. Chronic left greater than right middle ear and mastoid effusions.   Electronically Signed   By: Lars Pinks M.D.   On: 08/09/2013 16:54   Ct Chest W Contrast  08/09/2013   CLINICAL DATA:  Carcinoma of the tongue.  EXAM: CT CHEST WITH CONTRAST  TECHNIQUE: Multidetector CT imaging of the chest was performed during intravenous contrast administration.  CONTRAST:  157mL OMNIPAQUE IOHEXOL 300 MG/ML  SOLN  COMPARISON:  05/09/2013.  FINDINGS: The heart size appears normal. No pericardial effusion. Calcification involving the LAD, left circumflex coronary arteries are noted. There is no enlarged mediastinal or hilar lymph node identified. No pathologically enlarged axillary or supraclavicular lymph nodes  identified.  Postop change and volume loss within the right lung is noted compatible with previous right lower lobectomy. There is a small to moderate volume right pleural effusion present.  Multiple new ground-glass attenuating nodules are identified within the left lower lobe. Index nodule measures 9 mm , image 36/series 5.  Incidental imaging through the upper abdomen shows a 11 mm gallstone, image 55/series 2. There is mild hepatic steatosis identified.  Review of the visualized osseous structures is significant for mild spondylosis within the thoracic spine. No aggressive lytic or sclerotic bone lesion.  IMPRESSION: 1. Interval development of multiple ground-glass attenuating nodules within the left lower lobe which are likely the sequelae of inflammation, infection or aspiration. Short-term followup imaging in 3 months is recommended to ensure resolution. This recommendation follows the consensus statement: Recommendations for the Management of Subsolid Pulmonary Nodules Detected at CT: A Statement from the [place]. Radiology [id number]. 2. Status post right lower lobectomy. 3. Small right pleural effusion 4. Atherosclerotic disease including multi vessel coronary artery  calcification.   Electronically Signed   By: Kerby Moors M.D.   On: 08/09/2013 16:52    Impression/Plan:    1) Head and Neck Cancer Status: NED, suspect inflammation vs infection in Left lower lung - Rx'd azithromycin today, and will repeat CT chest in 3 mo  2) Nutritional Status: no active issues  3) Risk Factors: The patient has been educated about risk factors including alcohol and tobacco abuse; they understand that avoidance of alcohol and tobacco is important to prevent recurrences as well as other cancers. Pt requests Nicotrol inhaler, has failed other cessation techniques.  Rx'd this, and encouraged attending [place] tobacco cessation classes   4) Swallowing: sometime chokes on food.  Due to  complexity of visit, did not remember to offer re-consultation with SLP.  Will have [name] call pt to discuss this option w/ pt  5) Energy: ordered repeat TSH but this was not done today.  (no show?) will obtain TSH at next appt vs offer to draw this sooner if pt willing  7) Social: continue f/u with Social Work prn.  Son getting psychological services.  8) Other:  Refilled ASA, Ativan, Percocet, today. Pt does not have a PCP.  These medications are for health problems that are no longer related to his cancer history.  He will need to get future meds for join pain, anxiety, CAD, etc via a PCP.  9) Follow-up in 3 months. The patient was encouraged to call with any issues or questions before then.  I spent 25 minutes minutes face to face with the patient and more than 50% of that time was spent in counseling and/or coordination of care. _____________________________________   [name], MD

## 2013-08-10 NOTE — Progress Notes (Addendum)
Mr. Trevor Griffin here to to obtain the results of his CT Neck and chest completed on yesterday. Mr. Swider reports that he is able to eat soft foods now, but at times he chokes.  He has maintained his weight of 155.2.  His oral mucosa is slightly moist and intact.

## 2013-08-12 NOTE — Progress Notes (Signed)
To provide support and care continuity, met with patient and his wife during scheduled appt with Dr. Isidore Moos.  They did not express and needs; i encourage them to call me should that change.  They verbalized understanding.  Continuing to navigate as L3 (treatments completed) patient.  [name], RN, BSN, [facility] Head & Neck Oncology Navigator [phone]

## 2013-08-16 ENCOUNTER — Other Ambulatory Visit: Payer: Self-pay | Admitting: Radiation Oncology

## 2013-08-16 DIAGNOSIS — R918 Other nonspecific abnormal finding of lung field: Secondary | ICD-10-CM

## 2013-08-16 DIAGNOSIS — R911 Solitary pulmonary nodule: Secondary | ICD-10-CM

## 2013-08-18 ENCOUNTER — Ambulatory Visit: Payer: Medicaid Other | Attending: Radiation Oncology

## 2013-08-19 ENCOUNTER — Ambulatory Visit: Payer: MEDICAID | Admitting: Radiation Oncology

## 2013-09-05 ENCOUNTER — Other Ambulatory Visit: Payer: Self-pay | Admitting: Thoracic Surgery (Cardiothoracic Vascular Surgery)

## 2013-09-05 DIAGNOSIS — R222 Localized swelling, mass and lump, trunk: Secondary | ICD-10-CM

## 2013-09-06 ENCOUNTER — Ambulatory Visit: Payer: Self-pay | Admitting: Thoracic Surgery (Cardiothoracic Vascular Surgery)

## 2013-09-16 ENCOUNTER — Telehealth: Payer: Self-pay | Admitting: *Deleted

## 2013-09-16 NOTE — Telephone Encounter (Signed)
Patient called with refill request for tramadol (has about 10 tabs), lorazepam (has about 10 pills), and oxycodone/acetaminophen 5/325 (out, taking wife's Vicodin) .  The later he says he is taking for joint pain which I suggested he see his PCP to address.  When I said this, he stated he also takes it for neck spasms which he has 10-15/day.  I expressed that a RadOnc MD would probably want to see him before refilling the oxy/aceto. I notified Dr. Valere Dross.  [name], RN, BSN, [facility] Head & Neck Oncology Navigator [phone]

## 2013-09-20 ENCOUNTER — Ambulatory Visit: Payer: Medicaid Other | Admitting: Thoracic Surgery (Cardiothoracic Vascular Surgery)

## 2013-09-28 ENCOUNTER — Telehealth: Payer: Self-pay | Admitting: *Deleted

## 2013-09-28 NOTE — Telephone Encounter (Signed)
Patient's wife called to check on status of refill request.  Dr. Isidore Moos notified.  [name], RN, BSN, [facility] Head & Neck Oncology Navigator [phone]

## 2013-10-03 ENCOUNTER — Encounter: Payer: Self-pay | Admitting: Radiation Oncology

## 2013-10-03 ENCOUNTER — Telehealth: Payer: Self-pay | Admitting: *Deleted

## 2013-10-03 NOTE — Progress Notes (Signed)
Per [name]: "Patient called with refill request for tramadol (has about 10 tabs), lorazepam (has about 10 pills), and oxycodone/acetaminophen 5/325 (out, taking wife's Vicodin) . The later he says he is taking for joint pain which I suggested he see his PCP to address. When I said this, he stated he also takes it for neck spasms which he has 10-15/day. I expressed that a RadOnc MD would probably want to see him before refilling the oxy/aceto."   At my last followup with Trevor Griffin, I refilled several medications for him with the agreement that he would get a PCP to manage his non-oncologic symptoms. Per my note: "Refilled ASA, Ativan, Percocet, today. Pt does not have a PCP. These medications are for health problems that are no longer related to his cancer history. He will need to get future meds for joint pain, anxiety, CAD, etc via a PCP."  There, I will not refill the requested medications for Trevor Griffin.  I asked [name] to remind the patient and his wife of our agreement. We will offer a PT referral if he would like management for his neck spasms. I did not think narcotics are appropriate for long term management of this symptom.  Alternatively, he can see his surgeon, as the spasms may be related to his neck dissection.  -----------------------------------  [name], MD

## 2013-10-03 NOTE — Telephone Encounter (Addendum)
Called Mr. Stehle in follow-up to his request for medication refills.  I reiterated Dr. Pearlie Oyster guidance from his 08/10/13 follow-up appt that he should address his medication refill needs through his PCP.  I indicated, also, that to address the neck spasms he is having, per Dr. Isidore Moos:  1) he can  arrange an appt with his surgeon, and/or 2) she will make a referral to outpatient PT for him.  I asked him to call me to follow-up on his scheduling a PCP appt and his decision re: choice for neck spasm care.  He verbalized understanding.  [name], RN, BSN, [facility] Head & Neck Oncology Navigator [phone]

## 2013-10-21 ENCOUNTER — Other Ambulatory Visit: Payer: Self-pay | Admitting: Radiation Oncology

## 2013-10-25 ENCOUNTER — Telehealth: Payer: Self-pay | Admitting: *Deleted

## 2013-10-28 ENCOUNTER — Telehealth: Payer: Self-pay | Admitting: *Deleted

## 2013-10-28 NOTE — Telephone Encounter (Signed)
S/p multiple VMs between patient wife and myself, spoke with her this morning.  Reviewed with her Dr. Pearlie Oyster 08/10/13 recommendation that patient identify a PCP for future medication needs, my 10/03/13 discussion with him reinforcing same guidance.  She stated patient has not identified a PCP.  I explained that according to our records, Ativan was originally prescribed by another MD before patient began tmt.  She responded that she thinks that may have been Dr. Vicie Mutters, patient's surgeon.  In response to my inquiry, she stated patient has cancelled multiple surgical follow-up appts with Dr. Vicie Mutters .  I encouraged scheduling appt with Dr. Vicie Mutters, that perhaps he could address medication needs in the short term.  She verbalized understanding.   I responded that [place] is an option for a PCP, that I am looking to other options and will get back with further guidance.  She verbalized understanding.  [name], RN, BSN, [facility] Head & Neck Oncology Navigator [phone]

## 2013-10-28 NOTE — Telephone Encounter (Signed)
Patient's wife, gina, LVM on Monday stating patient needs an Ativan refill. Discussed with Dr. Isidore Moos who stated her previous guidance to identify a PCP for future medication needs has not changed.  Called pt wife to inform, LVM.

## 2013-11-10 ENCOUNTER — Ambulatory Visit: Admission: RE | Admit: 2013-11-10 | Payer: Medicaid Other | Source: Ambulatory Visit

## 2013-11-10 ENCOUNTER — Ambulatory Visit (HOSPITAL_COMMUNITY): Admission: RE | Admit: 2013-11-10 | Payer: MEDICAID | Source: Ambulatory Visit

## 2013-11-10 ENCOUNTER — Telehealth: Payer: Self-pay | Admitting: *Deleted

## 2013-11-10 NOTE — Telephone Encounter (Signed)
Called patient to inform of new date for lab, scan and fu, spoke with patient and he is aware of these appts.

## 2013-11-11 ENCOUNTER — Ambulatory Visit: Admission: RE | Admit: 2013-11-11 | Payer: MEDICAID | Source: Ambulatory Visit | Admitting: Radiation Oncology

## 2013-11-15 ENCOUNTER — Encounter (HOSPITAL_COMMUNITY): Payer: Self-pay

## 2013-11-15 ENCOUNTER — Ambulatory Visit
Admission: RE | Admit: 2013-11-15 | Discharge: 2013-11-15 | Disposition: A | Discharge status: Home / Self Care | Payer: Medicaid Other | Source: Ambulatory Visit | Attending: Radiation Oncology | Admitting: Radiation Oncology

## 2013-11-15 ENCOUNTER — Ambulatory Visit (HOSPITAL_COMMUNITY)
Admission: RE | Admit: 2013-11-15 | Discharge: 2013-11-15 | Disposition: A | Discharge status: Home / Self Care | Payer: Medicaid Other | Source: Ambulatory Visit | Attending: Radiation Oncology | Admitting: Radiation Oncology

## 2013-11-15 DIAGNOSIS — C023 Malignant neoplasm of anterior two-thirds of tongue, part unspecified: Secondary | ICD-10-CM

## 2013-11-15 DIAGNOSIS — K7689 Other specified diseases of liver: Secondary | ICD-10-CM | POA: Diagnosis not present

## 2013-11-15 DIAGNOSIS — C3490 Malignant neoplasm of unspecified part of unspecified bronchus or lung: Secondary | ICD-10-CM | POA: Diagnosis not present

## 2013-11-15 DIAGNOSIS — R911 Solitary pulmonary nodule: Secondary | ICD-10-CM

## 2013-11-15 DIAGNOSIS — C029 Malignant neoplasm of tongue, unspecified: Secondary | ICD-10-CM | POA: Insufficient documentation

## 2013-11-15 DIAGNOSIS — F17200 Nicotine dependence, unspecified, uncomplicated: Secondary | ICD-10-CM

## 2013-11-15 DIAGNOSIS — Z902 Acquired absence of lung [part of]: Secondary | ICD-10-CM | POA: Diagnosis not present

## 2013-11-15 DIAGNOSIS — I2510 Atherosclerotic heart disease of native coronary artery without angina pectoris: Secondary | ICD-10-CM | POA: Insufficient documentation

## 2013-11-15 DIAGNOSIS — I7090 Unspecified atherosclerosis: Secondary | ICD-10-CM | POA: Diagnosis not present

## 2013-11-15 DIAGNOSIS — R918 Other nonspecific abnormal finding of lung field: Secondary | ICD-10-CM

## 2013-11-15 DIAGNOSIS — K8020 Calculus of gallbladder without cholecystitis without obstruction: Secondary | ICD-10-CM | POA: Insufficient documentation

## 2013-11-15 DIAGNOSIS — J90 Pleural effusion, not elsewhere classified: Secondary | ICD-10-CM | POA: Insufficient documentation

## 2013-11-15 DIAGNOSIS — R0602 Shortness of breath: Secondary | ICD-10-CM | POA: Diagnosis present

## 2013-11-15 LAB — BUN AND CREATININE (CC13)
BUN: 10.1 mg/dL (ref 7.0–26.0)
CREATININE: 0.8 mg/dL (ref 0.7–1.3)

## 2013-11-15 LAB — TSH CHCC: TSH: 7.325 m[IU]/L — AB (ref 0.320–4.118)

## 2013-11-15 MED ORDER — IOHEXOL 300 MG/ML  SOLN
80.0000 mL | Freq: Once | INTRAMUSCULAR | Status: AC | PRN
  Administered 2013-11-15: 80 mL via INTRAVENOUS

## 2013-11-25 ENCOUNTER — Ambulatory Visit: Admission: RE | Admit: 2013-11-25 | Payer: MEDICAID | Source: Ambulatory Visit | Admitting: Radiation Oncology

## 2013-12-13 ENCOUNTER — Telehealth: Payer: Self-pay | Admitting: *Deleted

## 2013-12-13 NOTE — Telephone Encounter (Addendum)
Returned patient wife's 12/09/09 VM in which she requested appt with Dr. Isidore Moos and reporting of scan results @ 1150 and 1716.  LVM with request to call me.  Will attempt again tomorrow.  [name], RN, BSN, [place] at [place] [phone]

## 2013-12-14 ENCOUNTER — Telehealth: Payer: Self-pay | Admitting: *Deleted

## 2013-12-14 NOTE — Telephone Encounter (Signed)
Patient's wife returned my call this morning: 1)  Asked if they could either reschedule the 7/24 appt they missed to discuss results of 7/14 chest CT or alternatively would Dr. Isidore Moos call to discuss.  I relayed to Dr. Isidore Moos. 2)  Reported that patient is drinking heavily which is causing family issues.  I asked her if it would be helpful for our [place] to contact her to provide support/guidance, she responded that would.    I relayed this to [place]. 3)  Stated that patient has not see a PCP for any follow-up.   [name], RN, BSN, [place] at [place] [phone]

## 2013-12-27 ENCOUNTER — Telehealth: Payer: Self-pay | Admitting: *Deleted

## 2013-12-27 ENCOUNTER — Other Ambulatory Visit: Payer: Self-pay | Admitting: Radiation Oncology

## 2013-12-27 DIAGNOSIS — R918 Other nonspecific abnormal finding of lung field: Secondary | ICD-10-CM

## 2013-12-27 DIAGNOSIS — F17200 Nicotine dependence, unspecified, uncomplicated: Secondary | ICD-10-CM

## 2013-12-27 DIAGNOSIS — C023 Malignant neoplasm of anterior two-thirds of tongue, part unspecified: Secondary | ICD-10-CM

## 2013-12-27 NOTE — Telephone Encounter (Signed)
CALLED PATIENT TO INFORM OF LABS, TEST AND FU ALL ON 02-24-14, SPOKE WITH PATIENT AND HE IS AWARE OF ALL THESE APPTS. AND TIMES.

## 2013-12-27 NOTE — Telephone Encounter (Signed)
Per Dr. Pearlie Oyster guidance, I called patient to report findings of 11/15/13 chest CT.  He expressed appreciation for the "good news".  He stated he has been contacted by this office for a follow-up appt with Dr. Isidore Moos scheduled for 02/24/14.  [name], RN, BSN, [place] at [place] [phone]

## 2014-01-04 ENCOUNTER — Encounter: Payer: Self-pay | Admitting: *Deleted

## 2014-01-04 NOTE — Progress Notes (Signed)
[place] Work  Clinical Social Work was referred by Pension scheme manager for assessment of psychosocial needs.  Clinical Social Worker contacted patient's spouse, Trevor Griffin, to offer support and assess for needs.  Patient's spouse had previously shared concerns regarding housing, but reported that she has spoken with her landlord/friend and they have agreed to remain in their residence for another year.  She reports no concerns regarding the patient's current situation and states he is "doing okay".  Patient's spouse reported their son, Trevor Griffin, is enjoying school.  CSW encouraged Trevor Griffin for the second time to utilize [facility] free counseling interns to help provide her a safe space to provide support.  Discussed the importance of "caring for the caregiver".  Patient's spouse plans to follow up with CSW as needed.  [name], MSW, LCSW, OSW-C Clinical Social Worker [facility] [phone]

## 2014-02-02 HISTORY — PX: TRANSTHORACIC ECHOCARDIOGRAM: SHX275

## 2014-02-23 ENCOUNTER — Observation Stay (HOSPITAL_COMMUNITY)
Admission: EM | Admit: 2014-02-23 | Discharge: 2014-02-25 | Disposition: A | Discharge status: Home / Self Care | Payer: Medicaid Other | Attending: Cardiovascular Disease | Admitting: Cardiovascular Disease

## 2014-02-23 ENCOUNTER — Emergency Department (HOSPITAL_COMMUNITY): Payer: Medicaid Other

## 2014-02-23 ENCOUNTER — Encounter (HOSPITAL_COMMUNITY): Payer: Self-pay | Admitting: Emergency Medicine

## 2014-02-23 DIAGNOSIS — R931 Abnormal findings on diagnostic imaging of heart and coronary circulation: Secondary | ICD-10-CM

## 2014-02-23 DIAGNOSIS — Z7982 Long term (current) use of aspirin: Secondary | ICD-10-CM | POA: Insufficient documentation

## 2014-02-23 DIAGNOSIS — F17200 Nicotine dependence, unspecified, uncomplicated: Secondary | ICD-10-CM

## 2014-02-23 DIAGNOSIS — Z8249 Family history of ischemic heart disease and other diseases of the circulatory system: Secondary | ICD-10-CM | POA: Insufficient documentation

## 2014-02-23 DIAGNOSIS — Z955 Presence of coronary angioplasty implant and graft: Secondary | ICD-10-CM | POA: Insufficient documentation

## 2014-02-23 DIAGNOSIS — Z792 Long term (current) use of antibiotics: Secondary | ICD-10-CM | POA: Insufficient documentation

## 2014-02-23 DIAGNOSIS — I209 Angina pectoris, unspecified: Secondary | ICD-10-CM

## 2014-02-23 DIAGNOSIS — J449 Chronic obstructive pulmonary disease, unspecified: Secondary | ICD-10-CM | POA: Insufficient documentation

## 2014-02-23 DIAGNOSIS — R0602 Shortness of breath: Secondary | ICD-10-CM | POA: Insufficient documentation

## 2014-02-23 DIAGNOSIS — R079 Chest pain, unspecified: Secondary | ICD-10-CM | POA: Insufficient documentation

## 2014-02-23 DIAGNOSIS — F17210 Nicotine dependence, cigarettes, uncomplicated: Secondary | ICD-10-CM | POA: Diagnosis present

## 2014-02-23 DIAGNOSIS — I10 Essential (primary) hypertension: Secondary | ICD-10-CM | POA: Diagnosis present

## 2014-02-23 DIAGNOSIS — Z79891 Long term (current) use of opiate analgesic: Secondary | ICD-10-CM | POA: Insufficient documentation

## 2014-02-23 DIAGNOSIS — F419 Anxiety disorder, unspecified: Secondary | ICD-10-CM | POA: Diagnosis present

## 2014-02-23 DIAGNOSIS — F1010 Alcohol abuse, uncomplicated: Secondary | ICD-10-CM | POA: Diagnosis present

## 2014-02-23 DIAGNOSIS — I25118 Atherosclerotic heart disease of native coronary artery with other forms of angina pectoris: Secondary | ICD-10-CM

## 2014-02-23 DIAGNOSIS — E7800 Pure hypercholesterolemia, unspecified: Secondary | ICD-10-CM

## 2014-02-23 DIAGNOSIS — Z79899 Other long term (current) drug therapy: Secondary | ICD-10-CM | POA: Insufficient documentation

## 2014-02-23 DIAGNOSIS — R0789 Other chest pain: Principal | ICD-10-CM

## 2014-02-23 DIAGNOSIS — R918 Other nonspecific abnormal finding of lung field: Secondary | ICD-10-CM

## 2014-02-23 DIAGNOSIS — I252 Old myocardial infarction: Secondary | ICD-10-CM | POA: Insufficient documentation

## 2014-02-23 DIAGNOSIS — I255 Ischemic cardiomyopathy: Secondary | ICD-10-CM | POA: Diagnosis present

## 2014-02-23 DIAGNOSIS — Z9861 Coronary angioplasty status: Secondary | ICD-10-CM

## 2014-02-23 DIAGNOSIS — E785 Hyperlipidemia, unspecified: Secondary | ICD-10-CM | POA: Diagnosis present

## 2014-02-23 DIAGNOSIS — K219 Gastro-esophageal reflux disease without esophagitis: Secondary | ICD-10-CM | POA: Insufficient documentation

## 2014-02-23 DIAGNOSIS — R072 Precordial pain: Secondary | ICD-10-CM

## 2014-02-23 DIAGNOSIS — I2510 Atherosclerotic heart disease of native coronary artery without angina pectoris: Secondary | ICD-10-CM | POA: Diagnosis present

## 2014-02-23 HISTORY — DX: Tobacco use: Z72.0

## 2014-02-23 HISTORY — DX: Other chronic pain: G89.29

## 2014-02-23 LAB — URINALYSIS, ROUTINE W REFLEX MICROSCOPIC
BILIRUBIN URINE: NEGATIVE
Glucose, UA: NEGATIVE mg/dL
Hgb urine dipstick: NEGATIVE
KETONES UR: NEGATIVE mg/dL
Leukocytes, UA: NEGATIVE
NITRITE: NEGATIVE
PH: 7 (ref 5.0–8.0)
PROTEIN: NEGATIVE mg/dL
Specific Gravity, Urine: 1.005 (ref 1.005–1.030)
UROBILINOGEN UA: 0.2 mg/dL (ref 0.0–1.0)

## 2014-02-23 LAB — CBC WITH DIFFERENTIAL/PLATELET
Basophils Absolute: 0 10*3/uL (ref 0.0–0.1)
Basophils Relative: 1 % (ref 0–1)
Eosinophils Absolute: 0.1 10*3/uL (ref 0.0–0.7)
Eosinophils Relative: 1 % (ref 0–5)
HEMATOCRIT: 33.2 % — AB (ref 39.0–52.0)
Hemoglobin: 10.8 g/dL — ABNORMAL LOW (ref 13.0–17.0)
Lymphocytes Relative: 19 % (ref 12–46)
Lymphs Abs: 0.8 10*3/uL (ref 0.7–4.0)
MCH: 26.2 pg (ref 26.0–34.0)
MCHC: 32.5 g/dL (ref 30.0–36.0)
MCV: 80.6 fL (ref 78.0–100.0)
MONO ABS: 0.5 10*3/uL (ref 0.1–1.0)
Monocytes Relative: 10 % (ref 3–12)
NEUTROS ABS: 3.1 10*3/uL (ref 1.7–7.7)
Neutrophils Relative %: 69 % (ref 43–77)
PLATELETS: 235 10*3/uL (ref 150–400)
RBC: 4.12 MIL/uL — ABNORMAL LOW (ref 4.22–5.81)
RDW: 16.8 % — AB (ref 11.5–15.5)
WBC: 4.4 10*3/uL (ref 4.0–10.5)

## 2014-02-23 LAB — BASIC METABOLIC PANEL
ANION GAP: 11 (ref 5–15)
BUN: 12 mg/dL (ref 6–23)
CALCIUM: 9 mg/dL (ref 8.4–10.5)
CO2: 25 mEq/L (ref 19–32)
CREATININE: 0.75 mg/dL (ref 0.50–1.35)
Chloride: 101 mEq/L (ref 96–112)
GFR calc Af Amer: >90 mL/min (ref >90)
Glucose, Bld: 95 mg/dL (ref 70–99)
Potassium: 4.2 mEq/L (ref 3.7–5.3)
Sodium: 137 mEq/L (ref 137–147)

## 2014-02-23 LAB — I-STAT TROPONIN, ED: TROPONIN I, POC: 0.01 ng/mL (ref 0.00–0.08)

## 2014-02-23 LAB — TROPONIN I: Troponin I: <0.3 ng/mL (ref <0.30)

## 2014-02-23 MED ORDER — ACETAMINOPHEN 325 MG PO TABS
650.0000 mg | ORAL_TABLET | Freq: Four times a day (QID) | ORAL | Status: DC | PRN
  Filled 2014-02-23: qty 2

## 2014-02-23 MED ORDER — MORPHINE SULFATE 4 MG/ML IJ SOLN
4.0000 mg | Freq: Once | INTRAMUSCULAR | Status: AC
  Administered 2014-02-23: 4 mg via INTRAVENOUS
  Filled 2014-02-23: qty 1

## 2014-02-23 MED ORDER — NITROGLYCERIN 0.4 MG SL SUBL
0.4000 mg | SUBLINGUAL_TABLET | SUBLINGUAL | Status: DC | PRN
  Filled 2014-02-23: qty 1

## 2014-02-23 MED ORDER — NITROGLYCERIN IN D5W 200-5 MCG/ML-% IV SOLN
0.0000 ug/min | INTRAVENOUS | Status: DC
  Filled 2014-02-23: qty 250

## 2014-02-23 MED ORDER — IOHEXOL 350 MG/ML SOLN
100.0000 mL | Freq: Once | INTRAVENOUS | Status: AC | PRN
  Administered 2014-02-23: 100 mL via INTRAVENOUS

## 2014-02-23 MED ORDER — METOPROLOL TARTRATE 12.5 MG HALF TABLET
12.5000 mg | ORAL_TABLET | Freq: Two times a day (BID) | ORAL | Status: DC
  Administered 2014-02-24 – 2014-02-25 (×2): 12.5 mg via ORAL
  Filled 2014-02-23 (×5): qty 1

## 2014-02-23 NOTE — ED Notes (Signed)
Patient refused Nitro says he already received five and all they did is give him a headache.

## 2014-02-23 NOTE — ED Notes (Signed)
Patient is saying he is SOB, but he is oxygen saturation level is 99% on room air.  The patient was placed on 2L/[place].

## 2014-02-23 NOTE — H&P (Signed)
Patient ID: Trevor Griffin MRN: [id number], DOB/AGE: 1955-06-02   Admit date: 02/23/2014   Primary Physician: Scarlette Calico, MD Primary Cardiologist: Aundra Dubin  CC: Chest pain  Problem List  Past Medical History  Diagnosis Date  . History of tobacco abuse     quit Oct '12  . Hyperlipidemia   . Ischemic cardiomyopathy     Echo 08/20/11 with EF 50-55% (previously 35%)  . Hypertension   . Obesity   . Depression   . CAD (coronary artery disease)     Ant STEMI 5/08 with 4 Taxus DES proximal to mid LAD. 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 Cath in 2/09 also showed 80% mid to distal CFX stenosis. Myoview (6/11): EF 37%, large anteroapical infarct with no ischemia. [place] 10/12: LHC with prox to mid LAD stent ok with 10-20% ISR, mLAD 40%, CFX 30-40%, mCFX 90%, EF 35% - DES to mCFX  . GERD (gastroesophageal reflux disease)   . COPD (chronic obstructive pulmonary disease)   . Alcohol abuse   . Fatigue     sleep test neg 2008  . Anterior myocardial infarction 2008  . Palpitations     For event monitor 01/2012  . Hx of radiation therapy 10/25/12-12/10/12     Right Tongue/ Bilateral Neck //Total Dose, 60 Gy in 30 Fractions  . Sleep apnea     not on CPAP  . Headache(784.0)   . Anxiety   . Tongue cancer     Past Surgical History  Procedure Laterality Date  . Cardiac catheterization      ANTERIOR STEMI W/4  OVERLAPPING TAXUS STENTS IN THE PROXIMAL TO MID LAD  . Coronary angioplasty    . Fracture surgery Right     as a child,leg  . Video assisted thoracoscopy (vats)/wedge resection Right 06/06/2013    Procedure: VIDEO ASSISTED THORACOSCOPY (VATS)/WEDGE RESECTION;  Surgeon: Melrose Nakayama, MD;  Location: [place];  Service: Thoracic;  Laterality: Right;  . Lobectomy Right 06/06/2013    Procedure: LOBECTOMY;  Surgeon: Melrose Nakayama, MD;  Location: [place];  Service: Thoracic;  Laterality: Right;      Allergies  Allergies  Allergen Reactions  . Nubain [Nalbuphine Hcl]     Headache and nausea  . Hydromorphone Other (See Comments)    Headache,nausea    HPI The patient is a 58M with a history of tongue cancer currently undergoing therapy, CAD s/p multiple MI (last 2009) and PCI to mLAD 2008 and mLCx 2012 presenting with 10 hours of chest discomfort. He reports that he was doing well until 11am when he developed sharp chest discomfort at rest, unchanged by exertion. He reports that it felt similar to his episode in 2012 though not nearly as severe as his episodes in 2008-9. He was initially ambivalent to coming to the hospital but his wife convinced him to call 911. Of note his brother died 2 weeks ago with an acute MI.  In the ED VS: HR93, BP 114/71. He underwent CT-PA which was negative for PE. He is currently chest pain free. Cardiology was consulted for additional evaluation and management.   Home Medications  Prior to Admission medications   Medication Sig Start Date End Date Taking? Authorizing Provider  acetaminophen (TYLENOL) 500 MG tablet Take 1,000 mg by mouth every 8 (eight) hours as needed (pain).   Yes Historical Provider, MD  aspirin 325 MG tablet Take 1 tablet (325 mg total)  by mouth daily. 08/10/13  Yes [name], MD  atorvastatin (LIPITOR) 80 MG tablet Take 1 tablet (80 mg total) by mouth daily. 05/23/13  Yes Marye Round, MD  buPROPion [facility] SR) 150 MG 12 hr tablet Take 150 mg by mouth 2 (two) times daily. 12/02/11  Yes Larey Dresser, MD  Multiple Vitamins-Minerals (MULTIVITAMIN WITH MINERALS) tablet Take 1 tablet by mouth daily.     Yes Historical Provider, MD  Naproxen Sodium (ALEVE PO) Take 2 tablets by mouth daily as needed (pain).   Yes Historical Provider, MD  nicotine (NICOTROL) 10 MG inhaler Inhale 1 cartridge (1 continuous puffing total) into the lungs as needed for smoking cessation. Best effect observed with frequent, continuous puffing over 20  minute periods throughout the day; 6 or more cartridges/day PRN [4 mg/cartridge]; do not exceed 16 cartridges/day; Use at least 6 cartridges/day for the first 3-6 weeks for improved success at [place] 08/10/13  Yes [name], MD  nitroGLYCERIN (NITROSTAT) 0.4 MG SL tablet Place 1 tablet (0.4 mg total) under the tongue every 5 (five) minutes as needed (up to 3 doses). For chest pain 01/22/12  Yes Dayna N Dunn, PA-C    Family History  Family History  Problem Relation Age of Onset  . Kidney disease Mother     CHRONIC  . Cancer Sister   . Liver disease Sister     LIVER FAILURE    Social History  History   Social History  . Marital Status: Married    Spouse Name: N/A    Number of Children: N/A  . Years of Education: N/A   Occupational History  . Not on file.   Social History Main Topics  . Smoking status: Former Smoker -- 2.00 packs/day for 40 years    Types: Cigarettes    Quit date: 09/02/2012  . Smokeless tobacco: Never Used     Comment: Stopped smoking cigars day before surgery  . Alcohol Use: Yes     Comment: 8-12 BEERS DAILY prior to surgery.  2 beers daily at present time  . Drug Use: No  . Sexual Activity: Yes   Other Topics Concern  . Not on file   Social History Narrative   Alcoholic beverage:  Yes      Drug use:  No      Seatbead Use: Yes      Firearms in home:  Yes      Exercise:  No      Smoke Alarm in your home: Yes                    Review of Systems General:  No chills, fever, night sweats or weight changes.  Cardiovascular:  +chest pain, +dyspnea on exertion, no edema, orthopnea, palpitations, paroxysmal nocturnal dyspnea. Dermatological: No rash, lesions/masses Respiratory: No cough, dyspnea Urologic: No hematuria, dysuria Abdominal:   No nausea, vomiting, diarrhea, bright red blood per rectum, melena, or hematemesis Neurologic:  No visual changes, wkns, changes in mental status. All other systems reviewed and are otherwise negative  except as noted above.  Physical Exam  Blood pressure 112/65, pulse 68, temperature 97.6 F (36.4 C), temperature source Oral, resp. rate 21, SpO2 100.00%.  General: Pleasant, NAD Psych: Normal affect. Neuro: Alert and oriented X 3. Moves all extremities spontaneously. HEENT: Normal  Neck: Supple without bruits or JVD. Lungs:  Resp regular and unlabored, CTA. Heart: RRR no s3, s4, or murmurs. Abdomen: Soft, non-tender, non-distended, BS + x 4.  Extremities: No clubbing, cyanosis or edema. DP/PT/Radials 2+ and equal bilaterally. No L radial pulse  Labs  Troponin (Point of Care Test)  Recent Labs  02/23/14 1758  TROPIPOC 0.01    Recent Labs  02/23/14 1727  TROPONINI <0.30   Lab Results  Component Value Date   WBC 4.4 02/23/2014   HGB 10.8* 02/23/2014   HCT 33.2* 02/23/2014   MCV 80.6 02/23/2014   PLT 235 02/23/2014    Recent Labs Lab 02/23/14 1716  NA 137  K 4.2  CL 101  CO2 25  BUN 12  CREATININE 0.75  CALCIUM 9.0  GLUCOSE 95   Lab Results  Component Value Date   CHOL 158 08/26/2011   HDL 40 08/26/2011   LDLCALC 94 08/26/2011   TRIG 118 08/26/2011   No results found for this basename: DDIMER     Radiology/Studies  Dg Chest 2 View  02/23/2014   CLINICAL DATA:  Left side chest pain, shortness of Breath for 1 day  EXAM: CHEST  2 VIEW  COMPARISON:  07/05/2013  FINDINGS: Cardiomediastinal silhouette is stable. There is small residual right pleural effusion with right basilar atelectasis. No acute infiltrate or pulmonary edema.  IMPRESSION: Small residual right pleural effusion with right basilar atelectasis. No acute infiltrate or pulmonary edema.   Electronically Signed   By: Lahoma Crocker M.D.   On: 02/23/2014 17:55   Ct Angio Chest Pe W/cm &/or Wo Cm  02/23/2014   CLINICAL DATA:  Of left-sided chest pain and shortness of breath since launch. History of MI a comet tongue cancer, and right lung cancer.  EXAM: CT ANGIOGRAPHY CHEST WITH CONTRAST  TECHNIQUE:  Multidetector CT imaging of the chest was performed using the standard protocol during bolus administration of intravenous contrast. Multiplanar CT image reconstructions and MIPs were obtained to evaluate the vascular anatomy.  CONTRAST:  126mL OMNIPAQUE IOHEXOL 350 MG/ML SOLN  COMPARISON:  Chest 02/23/2014  FINDINGS: Technically adequate study with good opacification of the segmental and central pulmonary arteries. No focal filling defects are demonstrated. No evidence of significant pulmonary embolus.  Normal heart size. Normal caliber thoracic aorta. Calcification in the coronary arteries and aorta. Great vessel origins are patent. No significant lymphadenopathy in the chest. Esophagus is decompressed. Postoperative changes in the right hilum and right lung.  Evaluation of lungs is limited due to respiratory motion artifact. There is volume loss on the right consistent with previous surgery. There is a small right pleural effusion with basilar atelectasis. Airways appear patent. No pneumothorax.  Included portions of the upper abdominal organs demonstrate cholelithiasis. Small accessory spleen. Degenerative changes in the spine. No destructive bone lesions appreciated.  Review of the MIP images confirms the above findings.  IMPRESSION: No evidence of significant pulmonary embolus. Postoperative changes in the right lung and right hilum. Small right pleural effusion with basilar atelectasis. Cholelithiasis.   Electronically Signed   By: Lucienne Capers M.D.   On: 02/23/2014 21:08    ECG SR at 90bpm, normal axis, intervals, LVH, no acute ST-TW changes.  ASSESSMENT AND PLAN Assessment/Plan 58yo man with PMH of significant CAD s/p PCI to LAD and LCx, now with new onset chest discomfort. His presentation is atypical but given his history I am concerned about ischemic disease. Differential diagnosis includes musculoskeletal pain, GERD, esophageal spasm, coronary artery disease, pneumonia, atypical chest pain  and other misc etiologies. PE excluded based on negative CT-PA - aspirin 324 mg given, 81 mg PO daily thereafter  - will defer  heparin initiation right now  - defer P2Y12 inhibition right now. Given his history, he may be a candidate for prolonged DAPT but would verify with his oncologists re: treatment plan for his malignancy. - atorvastatin 80 mg qHS first dose now - NPO after MN for possible LHC vs stress testing  - home medications continued - metoprolol 70mig bid started - TTE ordered. If he continues to have LVEF < 35% he may be a candidate for primary prevention ICD based on his cancer prognosis.  Signed, Raliegh Ip, MD MPH 02/23/2014, 10:20 PM

## 2014-02-23 NOTE — ED Provider Notes (Signed)
CSN: [id number]     Arrival date & time 02/23/14  1649 History   First MD Initiated Contact with Patient 02/23/14 1709     Chief Complaint  Patient presents with  . Chest Pain    The patient's wife called EMS and said he started hurting in his central chest.  Denies any other symptoms.     (Consider location/radiation/quality/duration/timing/severity/associated sxs/prior Treatment) Patient is a 58 y.o. male presenting with chest pain.  Chest Pain Pain location:  L chest Pain quality: sharp   Pain radiates to:  Does not radiate Pain severity:  Moderate Onset quality:  Sudden Duration:  4 hours Timing:  Constant Progression:  Unchanged Chronicity:  Recurrent (reports similar symptoms when he had a heart attack) Context comment:  Started during normal activituy Relieved by:  Nitroglycerin (morphine) Worsened by:  Nothing tried Associated symptoms: shortness of breath   Associated symptoms: no cough, no diaphoresis, no fever, no nausea and not vomiting     Past Medical History  Diagnosis Date  . History of tobacco abuse     quit Oct '12  . Hyperlipidemia   . Ischemic cardiomyopathy     Echo 08/20/11 with EF 50-55% (previously 35%)  . Hypertension   . Obesity   . Depression   . CAD (coronary artery disease)     Ant STEMI 5/08 with 4 Taxus DES proximal to mid LAD. 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 Cath in 2/09 also showed 80% mid to distal CFX stenosis. Myoview (6/11): EF 37%, large anteroapical infarct with no ischemia. [place] 10/12: LHC with prox to mid LAD stent ok with 10-20% ISR, mLAD 40%, CFX 30-40%, mCFX 90%, EF 35% - DES to mCFX  . GERD (gastroesophageal reflux disease)   . COPD (chronic obstructive pulmonary disease)   . Alcohol abuse   . Fatigue     sleep test neg 2008  . Anterior myocardial infarction 2008  . Palpitations     For event monitor 01/2012  . Hx of radiation therapy 10/25/12-12/10/12   Right Tongue/ Bilateral Neck //Total Dose, 60 Gy in 30 Fractions  . Sleep apnea     not on CPAP  . Headache(784.0)   . Anxiety   . Tongue cancer    Past Surgical History  Procedure Laterality Date  . Cardiac catheterization      ANTERIOR STEMI W/4  OVERLAPPING TAXUS STENTS IN THE PROXIMAL TO MID LAD  . Coronary angioplasty    . Fracture surgery Right     as a child,leg  . Video assisted thoracoscopy (vats)/wedge resection Right 06/06/2013    Procedure: VIDEO ASSISTED THORACOSCOPY (VATS)/WEDGE RESECTION;  Surgeon: Melrose Nakayama, MD;  Location: [place];  Service: Thoracic;  Laterality: Right;  . Lobectomy Right 06/06/2013    Procedure: LOBECTOMY;  Surgeon: Melrose Nakayama, MD;  Location: [place];  Service: Thoracic;  Laterality: Right;   Family History  Problem Relation Age of Onset  . Kidney disease Mother     CHRONIC  . Cancer Sister   . Liver disease Sister     LIVER FAILURE   History  Substance Use Topics  . Smoking status: Former Smoker -- 2.00 packs/day for 40 years    Types: Cigarettes    Quit date: 09/02/2012  . Smokeless tobacco: Never Used     Comment: Stopped smoking cigars day before surgery  . Alcohol Use: Yes     Comment: 8-12 BEERS  DAILY prior to surgery.  2 beers daily at present time    Review of Systems  Constitutional: Negative for fever and diaphoresis.  Respiratory: Positive for shortness of breath. Negative for cough.   Cardiovascular: Positive for chest pain.  Gastrointestinal: Negative for nausea and vomiting.  All other systems reviewed and are negative.     Allergies  Nubain and Hydromorphone  Home Medications   Prior to Admission medications   Medication Sig Start Date End Date Taking? Authorizing Provider  aspirin 325 MG tablet Take 1 tablet (325 mg total) by mouth daily. 08/10/13   [name], MD  atorvastatin (LIPITOR) 80 MG tablet Take 1 tablet (80 mg total) by mouth daily. 05/23/13   Marye Round, MD  azithromycin  (ZITHROMAX) 500 MG tablet Take 1 tablet (500 mg total) by mouth daily. 08/10/13   [name], MD  buPROPion [facility] SR) 150 MG 12 hr tablet Take 150 mg by mouth 2 (two) times daily. 12/02/11   Larey Dresser, MD  LORazepam (ATIVAN) 2 MG tablet Take 1 tablet (2 mg total) by mouth every 8 (eight) hours as needed for anxiety. 08/10/13   [name], MD  Multiple Vitamins-Minerals (MULTIVITAMIN WITH MINERALS) tablet Take 1 tablet by mouth daily.      Historical Provider, MD  nicotine (NICOTROL) 10 MG inhaler Inhale 1 cartridge (1 continuous puffing total) into the lungs as needed for smoking cessation. Best effect observed with frequent, continuous puffing over 20 minute periods throughout the day; 6 or more cartridges/day PRN [4 mg/cartridge]; do not exceed 16 cartridges/day; Use at least 6 cartridges/day for the first 3-6 weeks for improved success at [place] 08/10/13   [name], MD  nitroGLYCERIN (NITROSTAT) 0.4 MG SL tablet Place 1 tablet (0.4 mg total) under the tongue every 5 (five) minutes as needed (up to 3 doses). For chest pain 01/22/12   Dayna N Dunn, PA-C  oxyCODONE-acetaminophen (PERCOCET/ROXICET) 5-325 MG per tablet Take 1-2 tablets by mouth every 6 (six) hours as needed for severe pain. 08/10/13   [name], MD  traMADol (ULTRAM) 50 MG tablet Take 1 tablet (50 mg total) by mouth every 6 (six) hours as needed for moderate pain. 06/17/13   [name], MD  traZODone (DESYREL) 50 MG tablet Take 1 tablet (50 mg total) by mouth at bedtime. 05/23/13   Marye Round, MD   BP 114/72  Pulse 91  Temp(Src) 97.6 F (36.4 C) (Oral)  Resp 14  SpO2 99% Physical Exam  Nursing note and vitals reviewed. Constitutional: He is oriented to person, place, and time. He appears well-developed and well-nourished. No distress.  HENT:  Head: Normocephalic and atraumatic. Macrocephalic: partial tongue resection.  Mouth/Throat: Oropharynx is clear and moist.  Post surgical tongue changes  Eyes:  Conjunctivae are normal. Pupils are equal, round, and reactive to light. No scleral icterus.  Neck: Neck supple.  Cardiovascular: Normal rate, regular rhythm, normal heart sounds and intact distal pulses.   No murmur heard. Pulmonary/Chest: Effort normal and breath sounds normal. No stridor. No respiratory distress. He has no wheezes. He has no rales.  Abdominal: Soft. He exhibits no distension. There is no tenderness.  Musculoskeletal: Normal range of motion. He exhibits no edema.  Neurological: He is alert and oriented to person, place, and time.  Skin: Skin is warm and dry. No rash noted.  Psychiatric: He has a normal mood and affect. His behavior is normal.    ED Course  Procedures (including critical care time) Labs  Review Labs Reviewed  CBC WITH DIFFERENTIAL - Abnormal; Notable for the following:    RBC 4.12 (*)    Hemoglobin 10.8 (*)    HCT 33.2 (*)    RDW 16.8 (*)    All other components within normal limits  BASIC METABOLIC PANEL  URINALYSIS, ROUTINE W REFLEX MICROSCOPIC  TROPONIN I  I-STAT TROPOININ, ED    Imaging Review Dg Chest 2 View  02/23/2014   CLINICAL DATA:  Left side chest pain, shortness of Breath for 1 day  EXAM: CHEST  2 VIEW  COMPARISON:  07/05/2013  FINDINGS: Cardiomediastinal silhouette is stable. There is small residual right pleural effusion with right basilar atelectasis. No acute infiltrate or pulmonary edema.  IMPRESSION: Small residual right pleural effusion with right basilar atelectasis. No acute infiltrate or pulmonary edema.   Electronically Signed   By: Lahoma Crocker M.D.   On: 02/23/2014 17:55   Ct Angio Chest Pe W/cm &/or Wo Cm  02/23/2014   CLINICAL DATA:  Of left-sided chest pain and shortness of breath since launch. History of MI a comet tongue cancer, and right lung cancer.  EXAM: CT ANGIOGRAPHY CHEST WITH CONTRAST  TECHNIQUE: Multidetector CT imaging of the chest was performed using the standard protocol during bolus administration of  intravenous contrast. Multiplanar CT image reconstructions and MIPs were obtained to evaluate the vascular anatomy.  CONTRAST:  189mL OMNIPAQUE IOHEXOL 350 MG/ML SOLN  COMPARISON:  Chest 02/23/2014  FINDINGS: Technically adequate study with good opacification of the segmental and central pulmonary arteries. No focal filling defects are demonstrated. No evidence of significant pulmonary embolus.  Normal heart size. Normal caliber thoracic aorta. Calcification in the coronary arteries and aorta. Great vessel origins are patent. No significant lymphadenopathy in the chest. Esophagus is decompressed. Postoperative changes in the right hilum and right lung.  Evaluation of lungs is limited due to respiratory motion artifact. There is volume loss on the right consistent with previous surgery. There is a small right pleural effusion with basilar atelectasis. Airways appear patent. No pneumothorax.  Included portions of the upper abdominal organs demonstrate cholelithiasis. Small accessory spleen. Degenerative changes in the spine. No destructive bone lesions appreciated.  Review of the MIP images confirms the above findings.  IMPRESSION: No evidence of significant pulmonary embolus. Postoperative changes in the right lung and right hilum. Small right pleural effusion with basilar atelectasis. Cholelithiasis.   Electronically Signed   By: Lucienne Capers M.D.   On: 02/23/2014 21:08  All radiology studies independently viewed by me.      EKG Interpretation   Date/Time:  Thursday February 23 2014 16:52:06 EDT Ventricular Rate:  92 PR Interval:  185 QRS Duration: 88 QT Interval:  356 QTC Calculation: 440 R Axis:   22 Text Interpretation:  Sinus rhythm Consider left ventricular hypertrophy  Anterior Q waves, possibly due to LVH No significant change was found  Confirmed by [facility]  MD, TREY ([id number]) on 02/23/2014 6:11:15 PM      MDM   Final diagnoses:  Chest pain    58 yo male with history of CAD  presenting with 4 hours of left chest pain.  States it feels like when he has had a "blockage" in the past.  He took 324mg  of ASA prior to arrival.    Required several doses of morphine for pain control.  CTA PE study obtained given his elevated risk for PE.  It was negative.  [facility], who have admitted for further chest pain eval.  Artis Delay, MD 02/23/14 2326

## 2014-02-23 NOTE — ED Notes (Signed)
Patient transported to X-ray 

## 2014-02-23 NOTE — ED Notes (Signed)
Patient transported to CT 

## 2014-02-23 NOTE — ED Notes (Signed)
The patient's wife called EMS and said he started hurting in his central chest.  Denies any other symptoms.  The patient describes his pain as sharp, central chest pain with nor radiation.  He does have an IV, was given 5 nitros,and 10mg  of Morphine by EMS.  He took two nitros prior to EMS arrival.  EMS advised his pain was reduced from 6/10 to 4/10 with the morphine.

## 2014-02-23 NOTE — ED Notes (Signed)
Family at bedside. 

## 2014-02-24 ENCOUNTER — Ambulatory Visit: Admission: RE | Admit: 2014-02-24 | Payer: Medicaid Other | Source: Ambulatory Visit | Admitting: Radiation Oncology

## 2014-02-24 ENCOUNTER — Observation Stay (HOSPITAL_COMMUNITY): Payer: MEDICAID

## 2014-02-24 ENCOUNTER — Observation Stay (HOSPITAL_COMMUNITY): Payer: Medicaid Other

## 2014-02-24 ENCOUNTER — Encounter (HOSPITAL_COMMUNITY): Payer: Self-pay | Admitting: *Deleted

## 2014-02-24 ENCOUNTER — Ambulatory Visit (HOSPITAL_COMMUNITY): Admission: RE | Admit: 2014-02-24 | Payer: MEDICAID | Source: Ambulatory Visit

## 2014-02-24 ENCOUNTER — Ambulatory Visit: Admission: RE | Admit: 2014-02-24 | Payer: Medicaid Other | Source: Ambulatory Visit

## 2014-02-24 DIAGNOSIS — R072 Precordial pain: Secondary | ICD-10-CM

## 2014-02-24 DIAGNOSIS — R079 Chest pain, unspecified: Secondary | ICD-10-CM

## 2014-02-24 DIAGNOSIS — I359 Nonrheumatic aortic valve disorder, unspecified: Secondary | ICD-10-CM

## 2014-02-24 LAB — BASIC METABOLIC PANEL
Anion gap: 10 (ref 5–15)
BUN: 10 mg/dL (ref 6–23)
CALCIUM: 9 mg/dL (ref 8.4–10.5)
CO2: 25 mEq/L (ref 19–32)
CREATININE: 0.77 mg/dL (ref 0.50–1.35)
Chloride: 98 mEq/L (ref 96–112)
GFR calc Af Amer: >90 mL/min (ref >90)
GFR calc non Af Amer: >90 mL/min (ref >90)
GLUCOSE: 89 mg/dL (ref 70–99)
Potassium: 4.4 mEq/L (ref 3.7–5.3)
Sodium: 133 mEq/L — ABNORMAL LOW (ref 137–147)

## 2014-02-24 LAB — APTT: aPTT: 29 seconds (ref 24–37)

## 2014-02-24 LAB — CBC
HEMATOCRIT: 33.9 % — AB (ref 39.0–52.0)
Hemoglobin: 10.9 g/dL — ABNORMAL LOW (ref 13.0–17.0)
MCH: 26.1 pg (ref 26.0–34.0)
MCHC: 32.2 g/dL (ref 30.0–36.0)
MCV: 81.3 fL (ref 78.0–100.0)
Platelets: 217 10*3/uL (ref 150–400)
RBC: 4.17 MIL/uL — ABNORMAL LOW (ref 4.22–5.81)
RDW: 16.9 % — AB (ref 11.5–15.5)
WBC: 3.7 10*3/uL — AB (ref 4.0–10.5)

## 2014-02-24 LAB — MAGNESIUM: Magnesium: 2 mg/dL (ref 1.5–2.5)

## 2014-02-24 LAB — MRSA PCR SCREENING: MRSA by PCR: NEGATIVE

## 2014-02-24 MED ORDER — REGADENOSON 0.4 MG/5ML IV SOLN
INTRAVENOUS | Status: AC
  Administered 2014-02-24: 14:00:00
  Filled 2014-02-24: qty 5

## 2014-02-24 MED ORDER — TECHNETIUM TC 99M SESTAMIBI - CARDIOLITE
10.0000 | Freq: Once | INTRAVENOUS | Status: AC | PRN
  Administered 2014-02-24: 13:00:00 10 via INTRAVENOUS

## 2014-02-24 MED ORDER — BUPROPION HCL ER (SR) 150 MG PO TB12
150.0000 mg | ORAL_TABLET | Freq: Two times a day (BID) | ORAL | Status: DC
  Administered 2014-02-24 – 2014-02-25 (×3): 150 mg via ORAL
  Filled 2014-02-24 (×5): qty 1

## 2014-02-24 MED ORDER — REGADENOSON 0.4 MG/5ML IV SOLN
0.4000 mg | Freq: Once | INTRAVENOUS | Status: AC
  Administered 2014-02-24: 0.4 mg via INTRAVENOUS

## 2014-02-24 MED ORDER — HEPARIN SODIUM (PORCINE) 5000 UNIT/ML IJ SOLN
5000.0000 [IU] | Freq: Three times a day (TID) | INTRAMUSCULAR | Status: DC
  Administered 2014-02-24 – 2014-02-25 (×4): 5000 [IU] via SUBCUTANEOUS
  Filled 2014-02-24 (×7): qty 1

## 2014-02-24 MED ORDER — ATORVASTATIN CALCIUM 80 MG PO TABS
80.0000 mg | ORAL_TABLET | Freq: Every day | ORAL | Status: DC
  Administered 2014-02-24: 11:00:00 80 mg via ORAL
  Filled 2014-02-24 (×2): qty 1

## 2014-02-24 MED ORDER — LORAZEPAM 1 MG PO TABS
1.0000 mg | ORAL_TABLET | Freq: Once | ORAL | Status: AC
  Administered 2014-02-24: 1 mg via ORAL
  Filled 2014-02-24: qty 1

## 2014-02-24 MED ORDER — ONDANSETRON HCL 4 MG/2ML IJ SOLN: 4.0000 mg | Freq: Four times a day (QID) | INTRAMUSCULAR | Status: DC | PRN

## 2014-02-24 MED ORDER — ACETAMINOPHEN 325 MG PO TABS
650.0000 mg | ORAL_TABLET | ORAL | Status: DC | PRN
  Administered 2014-02-25: 650 mg via ORAL
  Filled 2014-02-24: qty 2

## 2014-02-24 MED ORDER — NICOTINE 10 MG IN INHA: 1.0000 | RESPIRATORY_TRACT | Status: DC | PRN

## 2014-02-24 MED ORDER — ACTIVE PARTNERSHIP FOR HEALTH OF YOUR HEART BOOK
Freq: Once | Status: AC
Start: 2014-02-24 — End: 2014-02-24
  Administered 2014-02-24: 02:00:00
  Filled 2014-02-24: qty 1

## 2014-02-24 MED ORDER — ASPIRIN 325 MG PO TABS
325.0000 mg | ORAL_TABLET | Freq: Every day | ORAL | Status: DC
  Administered 2014-02-24 – 2014-02-25 (×2): 325 mg via ORAL
  Filled 2014-02-24 (×2): qty 1

## 2014-02-24 MED ORDER — TECHNETIUM TC 99M SESTAMIBI GENERIC - CARDIOLITE
30.0000 | Freq: Once | INTRAVENOUS | Status: AC | PRN
  Administered 2014-02-24: 30 via INTRAVENOUS

## 2014-02-24 NOTE — Progress Notes (Signed)
Lexiscan CL performed.  Rosaria Ferries, PA-C 02/24/2014 2:27 PM [facility] [phone]

## 2014-02-24 NOTE — Progress Notes (Signed)
SUBJECTIVE:  No further chest pain.  No SOB.   PHYSICAL EXAM Filed Vitals:   02/24/14 0016 02/24/14 0100 02/24/14 0441 02/24/14 0520  BP: 140/67  108/58   Pulse: 91  73   Temp: 97.7 F (36.5 C)  97.7 F (36.5 C)   TempSrc: Oral  Oral   Resp: 18  18   Height:    5\' 8"  (1.727 m)  Weight:  158 lb 4.6 oz (71.8 kg)    SpO2: 97%  97%    General:  No distress Lungs:  Decreased breath sounds, absent on the right Heart:  RRR Abdomen:  Positive bowel sounds, no rebound no guarding Extremities:  No edema   LABS: Lab Results  Component Value Date   TROPONINI <0.30 02/23/2014   Results for orders placed during the hospital encounter of 02/23/14 (from the past 24 hour(s))  URINALYSIS, ROUTINE W REFLEX MICROSCOPIC     Status: None   Collection Time    02/23/14  5:07 PM      Result Value Ref Range   Color, Urine YELLOW  YELLOW   APPearance CLEAR  CLEAR   Specific Gravity, Urine 1.005  1.005 - 1.030   pH 7.0  5.0 - 8.0   Glucose, UA NEGATIVE  NEGATIVE mg/dL   Hgb urine dipstick NEGATIVE  NEGATIVE   Bilirubin Urine NEGATIVE  NEGATIVE   Ketones, ur NEGATIVE  NEGATIVE mg/dL   Protein, ur NEGATIVE  NEGATIVE mg/dL   Urobilinogen, UA 0.2  0.0 - 1.0 mg/dL   Nitrite NEGATIVE  NEGATIVE   Leukocytes, UA NEGATIVE  NEGATIVE  CBC WITH DIFFERENTIAL     Status: Abnormal   Collection Time    02/23/14  5:16 PM      Result Value Ref Range   WBC 4.4  4.0 - 10.5 K/uL   RBC 4.12 (*) 4.22 - 5.81 MIL/uL   Hemoglobin 10.8 (*) 13.0 - 17.0 g/dL   HCT 33.2 (*) 39.0 - 52.0 %   MCV 80.6  78.0 - 100.0 fL   MCH 26.2  26.0 - 34.0 pg   MCHC 32.5  30.0 - 36.0 g/dL   RDW 16.8 (*) 11.5 - 15.5 %   Platelets 235  150 - 400 K/uL   Neutrophils Relative % 69  43 - 77 %   Neutro Abs 3.1  1.7 - 7.7 K/uL   Lymphocytes Relative 19  12 - 46 %   Lymphs Abs 0.8  0.7 - 4.0 K/uL   Monocytes Relative 10  3 - 12 %   Monocytes Absolute 0.5  0.1 - 1.0 K/uL   Eosinophils Relative 1  0 - 5 %   Eosinophils Absolute 0.1   0.0 - 0.7 K/uL   Basophils Relative 1  0 - 1 %   Basophils Absolute 0.0  0.0 - 0.1 K/uL  BASIC METABOLIC PANEL     Status: None   Collection Time    02/23/14  5:16 PM      Result Value Ref Range   Sodium 137  137 - 147 mEq/L   Potassium 4.2  3.7 - 5.3 mEq/L   Chloride 101  96 - 112 mEq/L   CO2 25  19 - 32 mEq/L   Glucose, Bld 95  70 - 99 mg/dL   BUN 12  6 - 23 mg/dL   Creatinine, Ser 0.75  0.50 - 1.35 mg/dL   Calcium 9.0  8.4 - 10.5 mg/dL   GFR calc non Af Amer >  90  >90 mL/min   GFR calc Af Amer >90  >90 mL/min   Anion gap 11  5 - 15  TROPONIN I     Status: None   Collection Time    02/23/14  5:27 PM      Result Value Ref Range   Troponin I <0.30  <0.30 ng/mL  I-STAT TROPOININ, ED     Status: None   Collection Time    02/23/14  5:58 PM      Result Value Ref Range   Troponin i, poc 0.01  0.00 - 0.08 ng/mL   Comment 3           MRSA PCR SCREENING     Status: None   Collection Time    02/24/14 12:19 AM      Result Value Ref Range   MRSA by PCR NEGATIVE  NEGATIVE  APTT     Status: None   Collection Time    02/24/14  6:28 AM      Result Value Ref Range   aPTT 29  24 - 37 seconds  BASIC METABOLIC PANEL     Status: Abnormal   Collection Time    02/24/14  6:28 AM      Result Value Ref Range   Sodium 133 (*) 137 - 147 mEq/L   Potassium 4.4  3.7 - 5.3 mEq/L   Chloride 98  96 - 112 mEq/L   CO2 25  19 - 32 mEq/L   Glucose, Bld 89  70 - 99 mg/dL   BUN 10  6 - 23 mg/dL   Creatinine, Ser 0.77  0.50 - 1.35 mg/dL   Calcium 9.0  8.4 - 10.5 mg/dL   GFR calc non Af Amer >90  >90 mL/min   GFR calc Af Amer >90  >90 mL/min   Anion gap 10  5 - 15  CBC     Status: Abnormal   Collection Time    02/24/14  6:28 AM      Result Value Ref Range   WBC 3.7 (*) 4.0 - 10.5 K/uL   RBC 4.17 (*) 4.22 - 5.81 MIL/uL   Hemoglobin 10.9 (*) 13.0 - 17.0 g/dL   HCT 33.9 (*) 39.0 - 52.0 %   MCV 81.3  78.0 - 100.0 fL   MCH 26.1  26.0 - 34.0 pg   MCHC 32.2  30.0 - 36.0 g/dL   RDW 16.9 (*) 11.5 -  15.5 %   Platelets 217  150 - 400 K/uL  MAGNESIUM     Status: None   Collection Time    02/24/14  6:28 AM      Result Value Ref Range   Magnesium 2.0  1.5 - 2.5 mg/dL    Intake/Output Summary (Last 24 hours) at 02/24/14 0737 Last data filed at 02/24/14 0100  Gross per 24 hour  Intake      0 ml  Output    200 ml  Net   -200 ml    ASSESSMENT AND PLAN:  Chest pain:  Discussed with the patient.  Atypical.  No objective evidence of ischemia thus far.  I think that risk stratification with stress perfusion imaging would be optimal.    Lung cancer:  He will have to reschedule his Oncology appt.    Ischemic cardiomyopathy:  Echo ordered.  I would not pursue an ICD until we understand the status of his lung cancer.  He has ongoing evaluation.  HTN:  BP OK.  Eon Zunker 02/24/2014 7:37 AM

## 2014-02-24 NOTE — Progress Notes (Signed)
UR completed 

## 2014-02-24 NOTE — Progress Notes (Signed)
Pt states still smokes 1/3 ppd but is using wellbutrin and nicotine inhaler to quit.  Smoking cessation tips given and reviewed w/ pt.  Pt denies CP or SOB.  Lungs very diminished w/ rhonchi bil bases that clear after cough.  BP running low, 90/52 and 101/62. Tele SR 60.  Metoprolol held at this time.

## 2014-02-24 NOTE — Progress Notes (Signed)
Echocardiogram 2D Echocardiogram has been performed.  Trevor Griffin, Trevor Griffin 02/24/2014, 3:36 PM

## 2014-02-25 ENCOUNTER — Encounter (HOSPITAL_COMMUNITY): Payer: Self-pay | Admitting: Nurse Practitioner

## 2014-02-25 DIAGNOSIS — E780 Pure hypercholesterolemia: Secondary | ICD-10-CM

## 2014-02-25 DIAGNOSIS — R0789 Other chest pain: Secondary | ICD-10-CM

## 2014-02-25 DIAGNOSIS — I255 Ischemic cardiomyopathy: Secondary | ICD-10-CM

## 2014-02-25 DIAGNOSIS — R931 Abnormal findings on diagnostic imaging of heart and coronary circulation: Secondary | ICD-10-CM

## 2014-02-25 DIAGNOSIS — I209 Angina pectoris, unspecified: Secondary | ICD-10-CM

## 2014-02-25 DIAGNOSIS — F1010 Alcohol abuse, uncomplicated: Secondary | ICD-10-CM

## 2014-02-25 DIAGNOSIS — I25118 Atherosclerotic heart disease of native coronary artery with other forms of angina pectoris: Secondary | ICD-10-CM

## 2014-02-25 DIAGNOSIS — Z720 Tobacco use: Secondary | ICD-10-CM

## 2014-02-25 DIAGNOSIS — I10 Essential (primary) hypertension: Secondary | ICD-10-CM

## 2014-02-25 DIAGNOSIS — F419 Anxiety disorder, unspecified: Secondary | ICD-10-CM

## 2014-02-25 DIAGNOSIS — J984 Other disorders of lung: Secondary | ICD-10-CM

## 2014-02-25 MED ORDER — BUPROPION HCL ER (SR) 150 MG PO TB12: 150.0000 mg | ORAL_TABLET | Freq: Two times a day (BID) | ORAL | Status: DC

## 2014-02-25 MED ORDER — NITROGLYCERIN 0.4 MG SL SUBL: 0.4000 mg | SUBLINGUAL_TABLET | SUBLINGUAL | Status: AC | PRN

## 2014-02-25 MED ORDER — METOPROLOL TARTRATE 25 MG PO TABS: 12.5000 mg | ORAL_TABLET | Freq: Two times a day (BID) | ORAL | Status: DC

## 2014-02-25 MED ORDER — ATORVASTATIN CALCIUM 80 MG PO TABS: 80.0000 mg | ORAL_TABLET | Freq: Every day | ORAL | Status: DC

## 2014-02-25 MED ORDER — ASPIRIN 81 MG PO TABS: 81.0000 mg | ORAL_TABLET | Freq: Every day | ORAL | Status: AC

## 2014-02-25 MED ORDER — TRAMADOL HCL 50 MG PO TABS: 50.0000 mg | ORAL_TABLET | Freq: Four times a day (QID) | ORAL | Status: DC | PRN

## 2014-02-25 NOTE — Progress Notes (Signed)
The patient was seen and examined, and I agree with the assessment and plan as documented above. Pt presented with chest pain and ruled out for an ACS. Nuclear MPI study demonstrating anteroseptal infarct with mild peri-infarct ischemia. Does not require coronary angiography, and will manage medically utilizing a beta blocker as anti-ischemic therapy as he had not been on one prior to admission. No complaints of chest pain this morning. Stable for discharge.

## 2014-02-25 NOTE — Progress Notes (Signed)
Patient Name: Trevor Griffin [facility] Date of Encounter: 02/25/2014   Principal Problem:   Midsternal chest pain Active Problems:   SMOKER   CORONARY ATHEROSCLEROSIS NATIVE CORONARY ARTERY   HYPERCHOLESTEROLEMIA   HTN (hypertension)   Right lower lobe lung mass   Alcohol abuse   Anxiety   Cardiomyopathy, ischemic    SUBJECTIVE  Intermittent fleeting c/p overnight.  Nuc study yesterday with Anteroseptal infarct pattern with mild peril-infarct ischemia.  CE neg.  ECG non-acute throughout admission.  Eager to go home.  Requesting pain meds @ d/c (ran out @ home).  CURRENT MEDS . aspirin  325 mg Oral Daily  . atorvastatin  80 mg Oral Daily  . buPROPion  150 mg Oral BID  . heparin  5,000 Units Subcutaneous 3 times per day  . metoprolol tartrate  12.5 mg Oral BID    OBJECTIVE  Filed Vitals:   02/24/14 2201 02/25/14 0018 02/25/14 0418 02/25/14 0713  BP: 101/62 103/55 103/68 109/64  Pulse:  60 76 74  Temp:  97.4 F (36.3 C) 97.6 F (36.4 C) 97.5 F (36.4 C)  TempSrc:  Oral Oral Oral  Resp:  18 18 18   Height:      Weight:  158 lb 8.2 oz (71.9 kg)    SpO2:  95% 97% 97%    Intake/Output Summary (Last 24 hours) at 02/25/14 0940 Last data filed at 02/25/14 0848  Gross per 24 hour  Intake    960 ml  Output    975 ml  Net    -15 ml   Filed Weights   02/24/14 0100 02/25/14 0018  Weight: 158 lb 4.6 oz (71.8 kg) 158 lb 8.2 oz (71.9 kg)    PHYSICAL EXAM  General: Pleasant, NAD. Neuro: Alert and oriented X 3. Moves all extremities spontaneously. Psych: Normal affect. HEENT:  Normal  Neck: Supple without bruits or JVD. Lungs:  Resp regular and unlabored, CTA. Heart: RRR no s3, s4, 2/6 syst murmur @ apex. Abdomen: Soft, non-tender, non-distended, BS + x 4.  Extremities: No clubbing, cyanosis or edema. DP/PT/Radials 2+ and equal bilaterally.  Accessory Clinical Findings  CBC  Recent Labs  02/23/14 1716 02/24/14 0628  WBC 4.4 3.7*  NEUTROABS 3.1  --   HGB 10.8*  10.9*  HCT 33.2* 33.9*  MCV 80.6 81.3  PLT 235 765   Basic Metabolic Panel  Recent Labs  02/23/14 1716 02/24/14 0628  NA 137 133*  K 4.2 4.4  CL 101 98  CO2 25 25  GLUCOSE 95 89  BUN 12 10  CREATININE 0.75 0.77  CALCIUM 9.0 9.0  MG  --  2.0   Cardiac Enzymes  Recent Labs  02/23/14 1727  TROPONINI <0.30   TELE   rsr  Radiology/Studies  Nm Myocar Multi W/spect W/wall Motion / Ef  02/24/2014   CLINICAL DATA:  58 yo man with PNH of significant CAD S/cp PCI to LAD and LC, now with new onset chest discomfort. Lung cancer.  EXAM: MYOCARDIAL IMAGING WITH SECT (REST AND PHARMACOLOGIC-STRESS)  GATED LEFT VENTRICULAR WALL MOTION STUDY  LEFT VENTRICULAR EJECTION FRACTION   IMPRESSION: 1.  Anteroseptal infarct pattern with mild peril-infarct ischemia.  2.  Akinesis/hypokinesis of septal wall base to apex.  3. Left ventricular ejection fraction 53%  4. Intermediate-risk stress test findings*.  *2012 Appropriate Use Criteria for Coronary Revascularization Focused Update: DJ Am Coll Cardio. 4650;35(4):656-812. http://content.[employer].es.asp?articled=1201161   Electronically Signed   By: Candee Furbish   On: 02/24/2014 16:32  ASSESSMENT AND PLAN  1.  Midsternal Chest Pain/CAD:  Typical/atypical symptoms.  No obj evidence of ischemia by CE/ECG despite prolonged Ss.  Myoview yesterday showed old anteroseptal infarct with mild peri-infarct ischemia.  Overall feeling better with intermittent, fleeting c/p.  No further inpt cardiac eval required.  Cont Med rx including asa, statin, bb.  Ambulate with plan for d/c today.  2.  ICM:  EF 53% by spect/45-50% by echo.  Cont  BB.  3.  HTN:  Stable.  4.  HL:  On statin currently.  Last LDL was 94 in 08/2011.  Needs outpt f/u.  5.  R Lung Mass:  Pending further eval by oncology.  Was supposed to have imaging yesterday.  He will reschedule.  6.  Tob Abuse:  Cessation advised.  7.  ETOH Abuse:  Cessation advised.  8.  Chronic pain:  Says  he ran out of outpt pain and antianxiety meds.  I advised that I will give him 3 days worth of meds but that he will have to f/u with primary care for further refills.  Signed, Murray Hodgkins NP

## 2014-02-25 NOTE — Discharge Summary (Signed)
Discharge Summary   Patient ID: Trevor Griffin,  MRN: [id number], DOB/AGE: [birth year] 58 y.o.  Admit date: 02/23/2014 Discharge date: 02/25/2014  Primary Care Provider: Scarlette Calico Primary Cardiologist: Einar Crow, MD  Oncology: Chauncey Cruel. Isidore Moos, MD  Discharge Diagnoses Principal Problem:   Midsternal chest pain  **Low risk lexiscan myoview this admission.  Active Problems:   SMOKER   CORONARY ATHEROSCLEROSIS NATIVE CORONARY ARTERY   HYPERCHOLESTEROLEMIA   HTN (hypertension)   Right lower lobe lung mass  **Currently being evaluated by oncology.   Alcohol abuse   Anxiety   Cardiomyopathy, ischemic  **EF previously 35%, now 45-50%.  Allergies Allergies  Allergen Reactions  . Nubain [Nalbuphine Hcl]     Headache and nausea  . Hydromorphone Other (See Comments)    Headache,nausea   Procedures  2D Echocardiogram 10.23.2015  Study Conclusions  - Left ventricle: The cavity size was normal. Systolic function was   mildly reduced. The estimated ejection fraction was in the range   of 45% to 50%. There is akinesis of the mid-apicalanteroseptal   myocardium. Doppler parameters are consistent with abnormal left   ventricular relaxation (grade 1 diastolic dysfunction). - Aortic valve: There was mild regurgitation. - Left atrium: The atrium was mildly dilated. _____________   Wille Glaser 10.23.2015  IMPRESSION: 1.  Anteroseptal infarct pattern with mild peri-infarct ischemia. 2.  Akinesis/hypokinesis of septal wall base to apex. 3. Left ventricular ejection fraction 53% 4. Intermediate-risk stress test findings*. _____________   History of Present Illness  58 year old male with prior history of coronary artery disease status post prior pertains interventions to the LAD and left circumflex who was in his usual state of health until the morning of admission when he began to experience sharp substernal chest discomfort that was unchanged with exertion. After  approximately 10 hours of ongoing discomfort, he presented to the emergency department where ECG was nonacute and troponin was normal. CT angiography was performed to rule out pulmonary embolism and this was negative. He was admitted for further evaluation.  Hospital Course  Patient ruled out for myocardial infarction. He continued to have intermittent, fleeting, sharp chest discomfort. In the absence of objective evidence of ischemia, but given prior history of coronary artery disease, decision was made to pursue nuclear stress testing. October 23, patient underwent a Lexiscan Cardiolite revealing anteroseptal infarct pattern with mild peri-infarct ischemia. EF was 53%. Echocardiography was also carried out revealing an EF of 45-50%. Overall, stress test findings were felt to be in intermediate and we plan to continue medical therapy at this time. Low-dose beta blocker has been added to his regimen of aspirin and statin therapy. He's been doing this morning without recurrent chest discomfort will be discharged home today in good condition.  Discharge Vitals Blood pressure 109/64, pulse 74, temperature 97.5 F (36.4 C), temperature source Oral, resp. rate 18, height 5\' 8"  (1.727 m), weight 158 lb 8.2 oz (71.9 kg), SpO2 97.00%.  Filed Weights   02/24/14 0100 02/25/14 0018  Weight: 158 lb 4.6 oz (71.8 kg) 158 lb 8.2 oz (71.9 kg)    Labs  CBC  Recent Labs  02/23/14 1716 02/24/14 0628  WBC 4.4 3.7*  NEUTROABS 3.1  --   HGB 10.8* 10.9*  HCT 33.2* 33.9*  MCV 80.6 81.3  PLT 235 476   Basic Metabolic Panel  Recent Labs  02/23/14 1716 02/24/14 0628  NA 137 133*  K 4.2 4.4  CL 101 98  CO2 25 25  GLUCOSE 95 89  BUN 12  10  CREATININE 0.75 0.77  CALCIUM 9.0 9.0  MG  --  2.0   Cardiac Enzymes  Recent Labs  02/23/14 1727  TROPONINI <0.30   Disposition  Pt is being discharged home today in good condition.  Follow-up Plans & Appointments      Follow-up Information   Follow  up with Scarlette Calico, MD. (1-2 wks)    Specialty:  Internal Medicine   Contact information:   [address] [address] [phone]       Follow up with Loralie Champagne, MD. (we will arrange and contact you.)    Specialty:  Cardiology   Contact information:   [address] [place] [address] [phone]       Follow up with Acquanetta Belling, MD. (as scheduled.)    Specialty:  Radiation Oncology   Contact information:   [address] [place] [address] [phone]      Discharge Medications    Medication List         acetaminophen 500 MG tablet  Commonly known as:  TYLENOL  Take 1,000 mg by mouth every 8 (eight) hours as needed (pain).     ALEVE PO  Take 2 tablets by mouth daily as needed (pain).     aspirin 81 MG tablet  Take 1 tablet (81 mg total) by mouth daily.     atorvastatin 80 MG tablet  Commonly known as:  LIPITOR  Take 1 tablet (80 mg total) by mouth daily.     buPROPion 150 MG 12 hr tablet  Commonly known as:  WELLBUTRIN SR  Take 1 tablet (150 mg total) by mouth 2 (two) times daily.     metoprolol tartrate 25 MG tablet  Commonly known as:  LOPRESSOR  Take 0.5 tablets (12.5 mg total) by mouth 2 (two) times daily.     multivitamin with minerals tablet  Take 1 tablet by mouth daily.     nicotine 10 MG inhaler  Commonly known as:  NICOTROL  - Inhale 1 cartridge (1 continuous puffing total) into the lungs as needed for smoking cessation. Best effect observed with frequent, continuous puffing over 20 minute periods throughout the day;  - 6 or more cartridges/day PRN [4 mg/cartridge]; do not exceed 16 cartridges/day; Use at least 6 cartridges/day for the first 3-6 weeks for improved success at quiting     nitroGLYCERIN 0.4 MG SL tablet  Commonly known as:  NITROSTAT  Place 1 tablet (0.4 mg total) under the tongue every 5 (five) minutes as needed (up to 3 doses). For chest pain     traMADol 50  MG tablet  Commonly known as:  ULTRAM  Take 1 tablet (50 mg total) by mouth every 6 (six) hours as needed.       Outstanding Labs/Studies  None  Duration of Discharge Encounter   Greater than 30 minutes including physician time.  Signed, Murray Hodgkins NP 02/25/2014, 10:27 AM

## 2014-02-25 NOTE — Discharge Instructions (Signed)
***  PLEASE REMEMBER TO BRING ALL OF YOUR MEDICATIONS TO EACH OF YOUR FOLLOW-UP OFFICE VISITS.  

## 2014-05-16 ENCOUNTER — Telehealth: Payer: Self-pay | Admitting: *Deleted

## 2014-05-16 ENCOUNTER — Other Ambulatory Visit: Payer: Self-pay | Admitting: Radiation Oncology

## 2014-05-16 DIAGNOSIS — E039 Hypothyroidism, unspecified: Secondary | ICD-10-CM

## 2014-05-16 NOTE — Telephone Encounter (Signed)
CALLED PATIENT TO INFORM OF LAB ON 05-26-14 @ 2:15 PM, SPOKE WITH PATIENT'S WIFE AND SHE IS AWARE OF THIS APPT.

## 2014-05-26 ENCOUNTER — Ambulatory Visit: Admission: RE | Admit: 2014-05-26 | Payer: Medicaid Other | Source: Ambulatory Visit

## 2014-05-26 ENCOUNTER — Ambulatory Visit: Admission: RE | Admit: 2014-05-26 | Payer: Medicaid Other | Source: Ambulatory Visit | Admitting: Radiation Oncology

## 2014-05-29 ENCOUNTER — Ambulatory Visit: Payer: Medicaid Other | Admitting: Radiation Oncology

## 2014-06-09 ENCOUNTER — Ambulatory Visit
Admission: RE | Admit: 2014-06-09 | Discharge: 2014-06-09 | Disposition: A | Discharge status: Home / Self Care | Payer: Medicaid Other | Source: Ambulatory Visit | Attending: Radiation Oncology | Admitting: Radiation Oncology

## 2014-06-09 ENCOUNTER — Ambulatory Visit
Admission: RE | Admit: 2014-06-09 | Discharge: 2014-06-09 | Disposition: A | Discharge status: Home / Self Care | Payer: Self-pay | Source: Ambulatory Visit | Attending: Radiation Oncology | Admitting: Radiation Oncology

## 2014-06-09 ENCOUNTER — Encounter: Payer: Self-pay | Admitting: Radiation Oncology

## 2014-06-09 VITALS — BP 135/79 | HR 70 | Temp 97.6°F | Resp 20 | Wt 173.4 lb

## 2014-06-09 DIAGNOSIS — C023 Malignant neoplasm of anterior two-thirds of tongue, part unspecified: Secondary | ICD-10-CM

## 2014-06-09 DIAGNOSIS — E038 Other specified hypothyroidism: Secondary | ICD-10-CM

## 2014-06-09 DIAGNOSIS — E039 Hypothyroidism, unspecified: Secondary | ICD-10-CM

## 2014-06-09 LAB — TSH CHCC: TSH: 7.506 m[IU]/L — AB (ref 0.320–4.118)

## 2014-06-09 NOTE — Progress Notes (Addendum)
Pain Status: chronic generalized joint aches  Weight changes, if any: gained 15 lbs since Oct 2015  Nutritional Status a) intake: unable to eat meats, breads, dry mouth at times, "muscle spasms of left neck" b) using a feeding tube?: no c) weight changes, if any:   Swallowing Status: has to be careful not to eat too quickly so he does not choke  Dental (if applicable): When was last visit with dentistry , has dentures  Using fluoride trays daily? na   When was last ENT visit?   When is next ENT visit? No visit scheduled, "financially strapped right now'  Other notable issues, if any: unable to see ENT due to financial difficulties TSH drawn today Smoking 1/2 pack cigars daily, no cigarettes

## 2014-06-09 NOTE — Progress Notes (Signed)
Radiation Oncology         [phone] ________________________________  Name: Trevor Griffin MRN: [id number]  Date: 06/09/2014  DOB: 03-14-1956  Follow-Up Visit Note  CC: Scarlette Calico, MD  Philomena Doheny, MD   ICD-9-CM ICD-10-CM   1. Carcinoma of anterior two-thirds of tongue 141.4 C02.3 levothyroxine (SYNTHROID, LEVOTHROID) 25 MCG tablet     TSH     TSH     BUN     Creatinine, serum     CT Chest W Contrast  2. Other specified hypothyroidism 244.8 E03.8 levothyroxine (SYNTHROID, LEVOTHROID) 25 MCG tablet     TSH     TSH     BUN     Creatinine, serum     CT Chest W Contrast    Diagnosis and Prior Radiotherapy:  Pathologic T3 N1 squamous cell carcinoma; right oral tongue; grade 2  Indication for treatment: Postoperative, curative  Radiation treatment dates: 10/25/2012-12/10/2012  Site/dose: Right Tongue/ Bilateral Neck /// Total Dose, 60 Gy in 30 Fractions   pT1b , pN0 Right lower lobe squamous cell carcinoma, s/p wedge resection on 06-06-13   Narrative:  The patient returns today for routine follow-up.  He has missed previous followups and was last seen in April 2015.  He has cancelled with appts with Dr Vicie Mutters and is not scheduled to see him.  Pain Status: chronic generalized joint aches  Weight changes, if any: gained 15 lbs since Oct 2015  Nutritional Status a) intake: unable to eat meats, breads, dry mouth at times, "muscle spasms of left neck" b) using a feeding tube?: no   Swallowing Status: has to be careful not to eat too quickly so he does not choke  Dental (if applicable): has dentures      When is next ENT visit? No visit scheduled, "financially strapped right now'  Other notable issues, if any: unable to see ENT due to financial difficulties TSH drawn today Smoking 1/2 pack cigars daily, no cigarettes Fatigued and cold intolerance.  ALLERGIES:  is allergic to nubain and hydromorphone.  Meds: Current Outpatient Prescriptions  Medication Sig Dispense  Refill  . acetaminophen (TYLENOL) 500 MG tablet Take 1,000 mg by mouth every 8 (eight) hours as needed (pain).    Marland Kitchen aspirin 81 MG tablet Take 1 tablet (81 mg total) by mouth daily. 30 tablet 1  . atorvastatin (LIPITOR) 80 MG tablet Take 1 tablet (80 mg total) by mouth daily. 30 tablet 6  . buPROPion (WELLBUTRIN SR) 150 MG 12 hr tablet Take 1 tablet (150 mg total) by mouth 2 (two) times daily. 60 tablet 1  . metoprolol tartrate (LOPRESSOR) 25 MG tablet Take 0.5 tablets (12.5 mg total) by mouth 2 (two) times daily. 30 tablet 6  . Multiple Vitamins-Minerals (MULTIVITAMIN WITH MINERALS) tablet Take 1 tablet by mouth daily.      . Naproxen Sodium (ALEVE PO) Take 2 tablets by mouth daily as needed (pain).    . nitroGLYCERIN (NITROSTAT) 0.4 MG SL tablet Place 1 tablet (0.4 mg total) under the tongue every 5 (five) minutes as needed (up to 3 doses). For chest pain 25 tablet 3  . traMADol (ULTRAM) 50 MG tablet Take 1 tablet (50 mg total) by mouth every 6 (six) hours as needed. 15 tablet 0   No current facility-administered medications for this encounter.    Physical Findings: The patient is in no acute distress. Patient is alert and oriented.  weight is 173 lb 6.4 oz (78.654 kg). His oral temperature  is 97.6 F (36.4 C). His blood pressure is 135/79 and his pulse is 70. His respiration is 20. Marland Kitchen No concerning oral lesions. Dentures removed for exam.  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 No palpable neck nodes. Lungs CTAB but decreased sounds in RLL region. Heart RRR.  Skin intact.  Lab Findings: Lab Results  Component Value Date   WBC 3.7* 02/24/2014   HGB 10.9* 02/24/2014   HCT 33.9* 02/24/2014   MCV 81.3 02/24/2014   PLT 217 02/24/2014   CMP     Component Value Date/Time   NA 133* 02/24/2014 0628   K 4.4 02/24/2014 0628   CL 98 02/24/2014 0628   CO2 25 02/24/2014 0628   GLUCOSE 89 02/24/2014 0628   BUN 10 02/24/2014 0628   BUN 10.1 11/15/2013 1120   CREATININE 0.77 02/24/2014 0628   CREATININE  0.8 11/15/2013 1120   CREATININE 0.86 01/21/2012 1418   CALCIUM 9.0 02/24/2014 0628   PROT 5.8* 06/08/2013 0410   ALBUMIN 2.6* 06/08/2013 0410   AST 26 06/08/2013 0410   ALT 20 06/08/2013 0410   ALKPHOS 75 06/08/2013 0410   BILITOT 0.6 06/08/2013 0410   GFRNONAA >90 02/24/2014 0628   GFRAA >90 02/24/2014 0628     Lab Results  Component Value Date   TSH 7.506* 06/09/2014    Radiographic Findings: No results found.  Impression/Plan:    1) Head and Neck Cancer Status: NED, but missed CT of chest months ago for surveillance. Will reschedule ASAP.  2) Nutritional Status: no active issues  3) Risk Factors: The patient has been educated about risk factors including alcohol and tobacco abuse; they understand that avoidance of alcohol and tobacco is important to prevent recurrences as well as other cancers. Has failed multiple cessation techniques.    4) Swallowing: functional with effort  5) Thyroid function: start supplementation today: Lab Results  Component Value Date   TSH 7.506* 06/09/2014    7) Social: continue f/u with Social Work prn.  Son getting psychological services. Pt has history of noncompliance, missed appts, etc.   8) Other: CT chest as above. Declines PT for neck lymphedema.  9) Follow-up in 6 months. CT chest next available. Recheck TSH in 6 wks. The patient was encouraged to call with any issues or questions before then.  _____________________________________   [name], MD

## 2014-06-10 MED ORDER — LEVOTHYROXINE SODIUM 25 MCG PO TABS: 25.0000 ug | ORAL_TABLET | Freq: Every day | ORAL | Status: DC

## 2014-06-12 ENCOUNTER — Telehealth: Payer: Self-pay | Admitting: *Deleted

## 2014-06-12 NOTE — Telephone Encounter (Signed)
Called patient to inform of lab and test on 06-15-14, and labs on 07-21-14 and labs on 12-13-14 prior to fu visit with Dr. Isidore Moos on 12-13-14, spoke with patient's wife, Elise Benne and she is aware of these appts.

## 2014-06-12 NOTE — Telephone Encounter (Signed)
LVM for patient indicating result of last Friday's thyroid function test shows that thyroid is a bit sluggish, may be causing his fatigue, Rx for levothyroxine has been sent to [place] for his pick-up. Encouraged to call with questions.  [name], RN, BSN, [facility] [phone]

## 2014-06-15 ENCOUNTER — Ambulatory Visit (HOSPITAL_COMMUNITY): Payer: MEDICAID

## 2014-06-15 ENCOUNTER — Telehealth: Payer: Self-pay | Admitting: *Deleted

## 2014-06-15 ENCOUNTER — Ambulatory Visit: Payer: Self-pay

## 2014-06-15 NOTE — Telephone Encounter (Signed)
CALLED PATIENT TO INFORM THAT LABS AND SCAN HAS BEEN RESCHEDULED FOR 06-29-14, APPT. WAS RESCHEDULED BECAUSE PATIENT IS SICK, PATIENT VERIFIED THAT HE IS GOOD WITH THE NEW DATE AND TIME FOR LABS AND THIS TEST.

## 2014-06-20 ENCOUNTER — Observation Stay (HOSPITAL_COMMUNITY)
Admission: EM | Admit: 2014-06-20 | Discharge: 2014-06-22 | Disposition: A | Discharge status: Home / Self Care | Payer: Medicaid Other | Attending: Cardiology | Admitting: Cardiology

## 2014-06-20 ENCOUNTER — Encounter (HOSPITAL_COMMUNITY): Payer: Self-pay | Admitting: *Deleted

## 2014-06-20 ENCOUNTER — Emergency Department (HOSPITAL_COMMUNITY): Payer: Medicaid Other

## 2014-06-20 DIAGNOSIS — Z85810 Personal history of malignant neoplasm of tongue: Secondary | ICD-10-CM | POA: Insufficient documentation

## 2014-06-20 DIAGNOSIS — E785 Hyperlipidemia, unspecified: Secondary | ICD-10-CM | POA: Insufficient documentation

## 2014-06-20 DIAGNOSIS — Z955 Presence of coronary angioplasty implant and graft: Secondary | ICD-10-CM | POA: Insufficient documentation

## 2014-06-20 DIAGNOSIS — I255 Ischemic cardiomyopathy: Secondary | ICD-10-CM | POA: Insufficient documentation

## 2014-06-20 DIAGNOSIS — Z7982 Long term (current) use of aspirin: Secondary | ICD-10-CM | POA: Diagnosis not present

## 2014-06-20 DIAGNOSIS — E039 Hypothyroidism, unspecified: Secondary | ICD-10-CM | POA: Insufficient documentation

## 2014-06-20 DIAGNOSIS — K219 Gastro-esophageal reflux disease without esophagitis: Secondary | ICD-10-CM | POA: Insufficient documentation

## 2014-06-20 DIAGNOSIS — Z923 Personal history of irradiation: Secondary | ICD-10-CM | POA: Diagnosis not present

## 2014-06-20 DIAGNOSIS — R079 Chest pain, unspecified: Principal | ICD-10-CM | POA: Insufficient documentation

## 2014-06-20 DIAGNOSIS — C023 Malignant neoplasm of anterior two-thirds of tongue, part unspecified: Secondary | ICD-10-CM | POA: Diagnosis present

## 2014-06-20 DIAGNOSIS — F329 Major depressive disorder, single episode, unspecified: Secondary | ICD-10-CM | POA: Diagnosis not present

## 2014-06-20 DIAGNOSIS — I10 Essential (primary) hypertension: Secondary | ICD-10-CM | POA: Diagnosis not present

## 2014-06-20 DIAGNOSIS — I2510 Atherosclerotic heart disease of native coronary artery without angina pectoris: Secondary | ICD-10-CM | POA: Insufficient documentation

## 2014-06-20 DIAGNOSIS — F17210 Nicotine dependence, cigarettes, uncomplicated: Secondary | ICD-10-CM | POA: Insufficient documentation

## 2014-06-20 DIAGNOSIS — R0602 Shortness of breath: Secondary | ICD-10-CM

## 2014-06-20 DIAGNOSIS — E669 Obesity, unspecified: Secondary | ICD-10-CM | POA: Diagnosis not present

## 2014-06-20 DIAGNOSIS — J449 Chronic obstructive pulmonary disease, unspecified: Secondary | ICD-10-CM | POA: Diagnosis not present

## 2014-06-20 DIAGNOSIS — I252 Old myocardial infarction: Secondary | ICD-10-CM | POA: Insufficient documentation

## 2014-06-20 DIAGNOSIS — Z23 Encounter for immunization: Secondary | ICD-10-CM | POA: Diagnosis not present

## 2014-06-20 DIAGNOSIS — Z9861 Coronary angioplasty status: Secondary | ICD-10-CM

## 2014-06-20 LAB — CBC
HCT: 34.9 % — ABNORMAL LOW (ref 39.0–52.0)
HEMOGLOBIN: 11.5 g/dL — AB (ref 13.0–17.0)
MCH: 26.3 pg (ref 26.0–34.0)
MCHC: 33 g/dL (ref 30.0–36.0)
MCV: 79.9 fL (ref 78.0–100.0)
Platelets: 203 10*3/uL (ref 150–400)
RBC: 4.37 MIL/uL (ref 4.22–5.81)
RDW: 17.3 % — ABNORMAL HIGH (ref 11.5–15.5)
WBC: 4.2 10*3/uL (ref 4.0–10.5)

## 2014-06-20 LAB — BASIC METABOLIC PANEL
ANION GAP: 12 (ref 5–15)
BUN: 6 mg/dL (ref 6–23)
CHLORIDE: 101 mmol/L (ref 96–112)
CO2: 23 mmol/L (ref 19–32)
Calcium: 9.2 mg/dL (ref 8.4–10.5)
Creatinine, Ser: 0.82 mg/dL (ref 0.50–1.35)
GFR calc Af Amer: >90 mL/min (ref >90)
GFR calc non Af Amer: >90 mL/min (ref >90)
Glucose, Bld: 102 mg/dL — ABNORMAL HIGH (ref 70–99)
Potassium: 3.8 mmol/L (ref 3.5–5.1)
SODIUM: 136 mmol/L (ref 135–145)

## 2014-06-20 LAB — TROPONIN I: Troponin I: <0.03 ng/mL (ref <0.031)

## 2014-06-20 LAB — D-DIMER, QUANTITATIVE (NOT AT ARMC): D-Dimer, Quant: 0.92 ug/mL-FEU — ABNORMAL HIGH (ref 0.00–0.48)

## 2014-06-20 MED ORDER — IOHEXOL 350 MG/ML SOLN
100.0000 mL | Freq: Once | INTRAVENOUS | Status: AC | PRN
  Administered 2014-06-20: 100 mL via INTRAVENOUS

## 2014-06-20 MED ORDER — ASPIRIN 81 MG PO CHEW
324.0000 mg | CHEWABLE_TABLET | Freq: Once | ORAL | Status: AC
  Administered 2014-06-20: 324 mg via ORAL
  Filled 2014-06-20: qty 4

## 2014-06-20 MED ORDER — MORPHINE SULFATE 4 MG/ML IJ SOLN
8.0000 mg | Freq: Once | INTRAMUSCULAR | Status: AC
  Administered 2014-06-20: 8 mg via INTRAVENOUS
  Filled 2014-06-20: qty 2

## 2014-06-20 MED ORDER — MORPHINE SULFATE 4 MG/ML IJ SOLN: 4.0000 mg | Freq: Once | INTRAMUSCULAR | Status: DC

## 2014-06-20 NOTE — ED Notes (Signed)
Pt reports left sided cp radiating down right arm and sob x2 days.  Pt has hx of 2 massive MI and 7 stents.  Pt alert and oriented.

## 2014-06-20 NOTE — ED Notes (Signed)
Pt started having chest pain 4 days ago. The pain became unbearable 2 hours ago, so he called EMS. Pt took 2 nitro prior to arrival, and was given 3 nitro and 324mg  of ASA in route.  Pt describes chest pain 8 out of 10 with pressure and going down his left arm.  No relief of pain yet.  VS are as follows: BP: 97/68 HR: 100, O2 sat: 100% on RA.

## 2014-06-20 NOTE — ED Notes (Signed)
Called CT to notify that pt was ready CT.

## 2014-06-20 NOTE — ED Notes (Signed)
Called CT to ask how many pts are ahead of him.  CT informed nurse that the wait would be 30 minutes.

## 2014-06-20 NOTE — ED Provider Notes (Signed)
CSN: [id number]     Arrival date & time 06/20/14  1940 History   First MD Initiated Contact with Patient 06/20/14 1950     Chief Complaint  Patient presents with  . Chest Pain     (Consider location/radiation/quality/duration/timing/severity/associated sxs/prior Treatment) HPI  59 year old male with a history of CAD and ischemic cardiomyopathy presents with chest pain that started 3-4 days ago. Progressively worsened and then severely worsened around 4 PM this afternoon. Pain is a sharp pain but feels similar to prior MIs and he feels like this is his anginal equivalent. Increases somewhat with inspiration. No exertional symptoms. No leg swelling or leg pain. Also has pain in his left arm. Patient tried 2 nitroglycerin at home and was given 3 nitroglycerin by EMS with no relief. Still rates an 8 out of 10 pain.   Past Medical History  Diagnosis Date  . Tobacco abuse   . Hyperlipidemia   . Ischemic cardiomyopathy     a. Echo 08/20/11 with EF 50-55% (previously 35%);  b. 02/2014 Echo: EF 45-50-%, mid-apicalanteroseptal AK, Gr 1 dd, mild AI, midlly dil LA.  Marland Kitchen Hypertension   . Obesity   . Depression   . CAD (coronary artery disease)     a. Ant STEMI 5/08 with 4 Taxus DES proximal to mid LAD;  b. 06/2007:  Stopped Plavix->anterior STEMI/stent thrombosis in LAD (PTCA), LCX 51m/d;  c. 10/12: Cath/PCI: prox to mid LAD stent ok with 10-20% ISR, mLAD 40%, CFX 30-40%, mCFX 90% (DES), EF 35%;  d. 02/2014 MV: antsept infarct w/ mild peri-infarct ischemia->med rx.  Marland Kitchen GERD (gastroesophageal reflux disease)   . COPD (chronic obstructive pulmonary disease)   . Alcohol abuse   . Fatigue     sleep test neg 2008  . Palpitations     For event monitor 01/2012  . Hx of radiation therapy 10/25/12-12/10/12     Right Tongue/ Bilateral Neck //Total Dose, 60 Gy in 30 Fractions  . Sleep apnea     not on CPAP  . Headache(784.0)   . Anxiety   . Tongue cancer   . Chronic pain    Past Surgical History  Procedure  Laterality Date  . Cardiac catheterization      ANTERIOR STEMI W/4  OVERLAPPING TAXUS STENTS IN THE PROXIMAL TO MID LAD  . Coronary angioplasty    . Fracture surgery Right     as a child,leg  . Video assisted thoracoscopy (vats)/wedge resection Right 06/06/2013    Procedure: VIDEO ASSISTED THORACOSCOPY (VATS)/WEDGE RESECTION;  Surgeon: Melrose Nakayama, MD;  Location: [place];  Service: Thoracic;  Laterality: Right;  . Lobectomy Right 06/06/2013    Procedure: LOBECTOMY;  Surgeon: Melrose Nakayama, MD;  Location: [place];  Service: Thoracic;  Laterality: Right;   Family History  Problem Relation Age of Onset  . Kidney disease Mother     CHRONIC  . Cancer Sister   . Liver disease Sister     LIVER FAILURE   History  Substance Use Topics  . Smoking status: Current Every Day Smoker -- 0.30 packs/day for 40 years    Types: Cigarettes  . Smokeless tobacco: Never Used     Comment: Stopped smoking cigars day before surgery, 06/09/14 smoking 1/2 pack cigars daily, no cigarettes  . Alcohol Use: Yes     Comment: 8-12 BEERS DAILY prior to surgery.  2 beers daily at present time    Review of Systems  Respiratory: Positive for shortness of breath.   Cardiovascular:  Positive for chest pain. Negative for leg swelling.  Gastrointestinal: Negative for vomiting and abdominal pain.  All other systems reviewed and are negative.     Allergies  Nubain and Hydromorphone  Home Medications   Prior to Admission medications   Medication Sig Start Date End Date Taking? Authorizing Provider  acetaminophen (TYLENOL) 500 MG tablet Take 1,000 mg by mouth every 8 (eight) hours as needed (pain).    Historical Provider, MD  aspirin 81 MG tablet Take 1 tablet (81 mg total) by mouth daily. 02/25/14   Rogelia Mire, NP  atorvastatin (LIPITOR) 80 MG tablet Take 1 tablet (80 mg total) by mouth daily. 02/25/14   Rogelia Mire, NP  buPROPion (WELLBUTRIN SR) 150 MG 12 hr tablet Take 1 tablet (150 mg  total) by mouth 2 (two) times daily. 02/25/14   Rogelia Mire, NP  levothyroxine (SYNTHROID, LEVOTHROID) 25 MCG tablet Take 1 tablet (25 mcg total) by mouth daily before breakfast. 06/10/14   [name], MD  metoprolol tartrate (LOPRESSOR) 25 MG tablet Take 0.5 tablets (12.5 mg total) by mouth 2 (two) times daily. 02/25/14   Rogelia Mire, NP  Multiple Vitamins-Minerals (MULTIVITAMIN WITH MINERALS) tablet Take 1 tablet by mouth daily.      Historical Provider, MD  Naproxen Sodium (ALEVE PO) Take 2 tablets by mouth daily as needed (pain).    Historical Provider, MD  nitroGLYCERIN (NITROSTAT) 0.4 MG SL tablet Place 1 tablet (0.4 mg total) under the tongue every 5 (five) minutes as needed (up to 3 doses). For chest pain 02/25/14   Rogelia Mire, NP  traMADol (ULTRAM) 50 MG tablet Take 1 tablet (50 mg total) by mouth every 6 (six) hours as needed. 02/25/14   Rogelia Mire, NP   BP 113/81 mmHg  Pulse 102  Temp(Src) 97.6 F (36.4 C) (Axillary)  Resp 19  Ht 5\' 8"  (1.727 m)  Wt 166 lb (75.297 kg)  BMI 25.25 kg/m2  SpO2 97% Physical Exam  Constitutional: He is oriented to person, place, and time. He appears well-developed and well-nourished.  HENT:  Head: Normocephalic and atraumatic.  Right Ear: External ear normal.  Left Ear: External ear normal.  Nose: Nose normal.  Eyes: Right eye exhibits no discharge. Left eye exhibits no discharge.  Neck: Neck supple.  Cardiovascular: Normal rate, regular rhythm, normal heart sounds and intact distal pulses.   Pulmonary/Chest: Effort normal. He exhibits no tenderness.  Abdominal: Soft. He exhibits no distension. There is no tenderness.  Musculoskeletal: He exhibits no edema.  Neurological: He is alert and oriented to person, place, and time.  Skin: Skin is warm and dry.  Nursing note and vitals reviewed.   ED Course  Procedures (including critical care time) Labs Review Labs Reviewed  BASIC METABOLIC PANEL - Abnormal;  Notable for the following:    Glucose, Bld 102 (*)    All other components within normal limits  CBC - Abnormal; Notable for the following:    Hemoglobin 11.5 (*)    HCT 34.9 (*)    RDW 17.3 (*)    All other components within normal limits  D-DIMER, QUANTITATIVE - Abnormal; Notable for the following:    D-Dimer, Quant 0.92 (*)    All other components within normal limits  TROPONIN I    Imaging Review Ct Angio Chest Pe W/cm &/or Wo Cm  06/20/2014   CLINICAL DATA:  Left-sided chest pain for 2-3 days.  EXAM: CT ANGIOGRAPHY CHEST WITH CONTRAST  TECHNIQUE: Multidetector  CT imaging of the chest was performed using the standard protocol during bolus administration of intravenous contrast. Multiplanar CT image reconstructions and MIPs were obtained to evaluate the vascular anatomy.  CONTRAST:  137mL OMNIPAQUE IOHEXOL 350 MG/ML SOLN  COMPARISON:  02/23/2014  FINDINGS: Cardiovascular: There is good opacification of the pulmonary vasculature. There is no pulmonary embolism. The thoracic aorta is normal in caliber and intact.  Lungs: There is right lower lobectomy. There is new patchy confluent airspace opacity in the right upper lobe anterior segment. This may represent infectious or inflammatory pneumonitis. Minimal patchy opacity is present in the right upper lobe posterior segment. Left lung is clear.  Central airways: Patent.  Effusions: Trace right pleural effusion.  Lymphadenopathy: None  Esophagus: Small hiatal hernia.  Upper abdomen: Cholelithiasis  Musculoskeletal: No significant abnormalities.  Review of the MIP images confirms the above findings.  IMPRESSION: Negative for pulmonary embolism. Right upper lobe alveolar infiltrate, likely infectious or inflammatory.   Electronically Signed   By: Andreas Newport M.D.   On: 06/20/2014 23:38   Dg Chest Port 1 View  06/20/2014   CLINICAL DATA:  Left-sided chest pain for 2 days, worsening. Shortness of breath and tingling in the left arm.  EXAM:  PORTABLE CHEST - 1 VIEW  COMPARISON:  PA and lateral chest and CT chest 02/23/2014.  FINDINGS: Small, chronic right pleural effusion is unchanged. There is no left pleural effusion. Lungs are clear. Heart size is normal. No pneumothorax.  IMPRESSION: No acute finding.  Small, chronic right pleural effusion.   Electronically Signed   By: Inge Rise M.D.   On: 06/20/2014 20:44     EKG Interpretation   Date/Time:  Tuesday June 20 2014 19:44:55 EST Ventricular Rate:  100 PR Interval:  184 QRS Duration: 92 QT Interval:  347 QTC Calculation: 447 R Axis:   22 Text Interpretation:  Sinus tachycardia Probable left atrial enlargement  Anteroseptal infarct, old Nonspecific T abnormalities, lateral leads T  wave abnormalities similar to Oct 2015 Confirmed by Regenia Skeeter  MD, Lendy Dittrich  [phone]) on 06/20/2014 7:56:35 PM       EKG Interpretation  Date/Time:  Tuesday June 20 2014 23:52:25 EST Ventricular Rate:  73 PR Interval:  183 QRS Duration: 93 QT Interval:  394 QTC Calculation: 434 R Axis:   18 Text Interpretation:  Normal sinus rhythm no significant change from earlier in the evening Confirmed by Deantre Bourdon  MD, Dallin Mccorkel ([id number]) on 06/21/2014 12:35:46 AM       MDM   Final diagnoses:  Chest pain, unspecified chest pain type    Patient with atypical chest pain, worse over past few hours. No EKG changes. Initial troponin negative. CTA shows no PE. Cards consulted, will likely admit for ACS r/o    Ephraim Hamburger, MD 06/21/14 [phone]

## 2014-06-21 ENCOUNTER — Encounter (HOSPITAL_COMMUNITY)
Admission: EM | Disposition: A | Discharge status: Home / Self Care | Payer: Self-pay | Source: Home / Self Care | Attending: Emergency Medicine

## 2014-06-21 DIAGNOSIS — I2510 Atherosclerotic heart disease of native coronary artery without angina pectoris: Secondary | ICD-10-CM

## 2014-06-21 DIAGNOSIS — E785 Hyperlipidemia, unspecified: Secondary | ICD-10-CM | POA: Diagnosis not present

## 2014-06-21 DIAGNOSIS — Z955 Presence of coronary angioplasty implant and graft: Secondary | ICD-10-CM | POA: Diagnosis not present

## 2014-06-21 DIAGNOSIS — I10 Essential (primary) hypertension: Secondary | ICD-10-CM

## 2014-06-21 DIAGNOSIS — R079 Chest pain, unspecified: Secondary | ICD-10-CM

## 2014-06-21 DIAGNOSIS — Z9861 Coronary angioplasty status: Secondary | ICD-10-CM

## 2014-06-21 DIAGNOSIS — I209 Angina pectoris, unspecified: Secondary | ICD-10-CM

## 2014-06-21 DIAGNOSIS — I255 Ischemic cardiomyopathy: Secondary | ICD-10-CM

## 2014-06-21 HISTORY — PX: LEFT HEART CATHETERIZATION WITH CORONARY ANGIOGRAM: SHX5451

## 2014-06-21 LAB — PROTIME-INR
INR: 1.1 (ref 0.00–1.49)
Prothrombin Time: 14.3 seconds (ref 11.6–15.2)

## 2014-06-21 LAB — BASIC METABOLIC PANEL
ANION GAP: 7 (ref 5–15)
BUN: 7 mg/dL (ref 6–23)
CHLORIDE: 101 mmol/L (ref 96–112)
CO2: 29 mmol/L (ref 19–32)
Calcium: 9.3 mg/dL (ref 8.4–10.5)
Creatinine, Ser: 0.96 mg/dL (ref 0.50–1.35)
GFR calc non Af Amer: 90 mL/min — ABNORMAL LOW (ref >90)
Glucose, Bld: 92 mg/dL (ref 70–99)
Potassium: 4.1 mmol/L (ref 3.5–5.1)
Sodium: 137 mmol/L (ref 135–145)

## 2014-06-21 LAB — TROPONIN I
Troponin I: <0.03 ng/mL (ref <0.031)
Troponin I: <0.03 ng/mL (ref <0.031)

## 2014-06-21 SURGERY — LEFT HEART CATHETERIZATION WITH CORONARY ANGIOGRAM

## 2014-06-21 MED ORDER — SODIUM CHLORIDE 0.9 % IJ SOLN
3.0000 mL | Freq: Two times a day (BID) | INTRAMUSCULAR | Status: DC
  Administered 2014-06-21 – 2014-06-22 (×4): 3 mL via INTRAVENOUS

## 2014-06-21 MED ORDER — SODIUM CHLORIDE 0.9 % IJ SOLN: 3.0000 mL | INTRAMUSCULAR | Status: DC | PRN

## 2014-06-21 MED ORDER — MIDAZOLAM HCL 2 MG/2ML IJ SOLN
INTRAMUSCULAR | Status: AC
Start: 2014-06-21 — End: 2014-06-21
  Filled 2014-06-21: qty 2

## 2014-06-21 MED ORDER — METOPROLOL TARTRATE 12.5 MG HALF TABLET
12.5000 mg | ORAL_TABLET | Freq: Two times a day (BID) | ORAL | Status: DC
  Administered 2014-06-21 – 2014-06-22 (×2): 12.5 mg via ORAL
  Filled 2014-06-21 (×3): qty 1

## 2014-06-21 MED ORDER — LIDOCAINE HCL (PF) 1 % IJ SOLN
INTRAMUSCULAR | Status: AC
  Filled 2014-06-21: qty 30

## 2014-06-21 MED ORDER — NITROGLYCERIN 0.4 MG SL SUBL: 0.4000 mg | SUBLINGUAL_TABLET | SUBLINGUAL | Status: DC | PRN

## 2014-06-21 MED ORDER — ASPIRIN EC 81 MG PO TBEC
81.0000 mg | DELAYED_RELEASE_TABLET | Freq: Every day | ORAL | Status: DC
  Administered 2014-06-22: 81 mg via ORAL
  Filled 2014-06-21: qty 1

## 2014-06-21 MED ORDER — INFLUENZA VAC SPLIT QUAD 0.5 ML IM SUSY
0.5000 mL | PREFILLED_SYRINGE | INTRAMUSCULAR | Status: AC
  Administered 2014-06-22: 0.5 mL via INTRAMUSCULAR
  Filled 2014-06-21: qty 0.5

## 2014-06-21 MED ORDER — VERAPAMIL HCL 2.5 MG/ML IV SOLN
INTRAVENOUS | Status: AC
  Filled 2014-06-21: qty 2

## 2014-06-21 MED ORDER — SODIUM CHLORIDE 0.9 % IV SOLN
INTRAVENOUS | Status: DC
  Administered 2014-06-21: 12:00:00 via INTRAVENOUS

## 2014-06-21 MED ORDER — HEPARIN SODIUM (PORCINE) 1000 UNIT/ML IJ SOLN
INTRAMUSCULAR | Status: AC
Start: 2014-06-21 — End: 2014-06-21
  Filled 2014-06-21: qty 1

## 2014-06-21 MED ORDER — HEPARIN (PORCINE) IN NACL 2-0.9 UNIT/ML-% IJ SOLN
INTRAMUSCULAR | Status: AC
  Filled 2014-06-21: qty 1000

## 2014-06-21 MED ORDER — NITROGLYCERIN 2 % TD OINT
1.0000 [in_us] | TOPICAL_OINTMENT | Freq: Once | TRANSDERMAL | Status: DC
  Filled 2014-06-21: qty 30

## 2014-06-21 MED ORDER — HEPARIN SODIUM (PORCINE) 5000 UNIT/ML IJ SOLN
5000.0000 [IU] | Freq: Three times a day (TID) | INTRAMUSCULAR | Status: DC
  Administered 2014-06-21 – 2014-06-22 (×3): 5000 [IU] via SUBCUTANEOUS
  Filled 2014-06-21 (×3): qty 1

## 2014-06-21 MED ORDER — ONDANSETRON HCL 4 MG/2ML IJ SOLN
4.0000 mg | Freq: Four times a day (QID) | INTRAMUSCULAR | Status: DC | PRN
  Administered 2014-06-21: 4 mg via INTRAVENOUS
  Filled 2014-06-21: qty 2

## 2014-06-21 MED ORDER — ACETAMINOPHEN 500 MG PO TABS: 1000.0000 mg | ORAL_TABLET | Freq: Three times a day (TID) | ORAL | Status: DC | PRN

## 2014-06-21 MED ORDER — NITROGLYCERIN 1 MG/10 ML FOR IR/CATH LAB
INTRA_ARTERIAL | Status: AC
  Filled 2014-06-21: qty 10

## 2014-06-21 MED ORDER — FENTANYL CITRATE 0.05 MG/ML IJ SOLN
INTRAMUSCULAR | Status: AC
  Filled 2014-06-21: qty 2

## 2014-06-21 MED ORDER — LEVOTHYROXINE SODIUM 25 MCG PO TABS
25.0000 ug | ORAL_TABLET | Freq: Every day | ORAL | Status: DC
  Administered 2014-06-21 – 2014-06-22 (×2): 25 ug via ORAL
  Filled 2014-06-21 (×2): qty 1

## 2014-06-21 MED ORDER — ASPIRIN 81 MG PO CHEW
81.0000 mg | CHEWABLE_TABLET | ORAL | Status: AC
  Administered 2014-06-21: 81 mg via ORAL
  Filled 2014-06-21: qty 1

## 2014-06-21 MED ORDER — MORPHINE SULFATE 4 MG/ML IJ SOLN
4.0000 mg | Freq: Once | INTRAMUSCULAR | Status: AC
  Administered 2014-06-21: 4 mg via INTRAVENOUS
  Filled 2014-06-21: qty 1

## 2014-06-21 MED ORDER — SODIUM CHLORIDE 0.9 % IV SOLN: 250.0000 mL | INTRAVENOUS | Status: DC | PRN

## 2014-06-21 MED ORDER — NITROGLYCERIN 2 % TD OINT
1.0000 [in_us] | TOPICAL_OINTMENT | Freq: Once | TRANSDERMAL | Status: AC
  Administered 2014-06-21: 1 [in_us] via TOPICAL
  Filled 2014-06-21: qty 1

## 2014-06-21 MED ORDER — SODIUM CHLORIDE 0.9 % IV SOLN
INTRAVENOUS | Status: AC
  Administered 2014-06-21: 17:00:00 via INTRAVENOUS

## 2014-06-21 MED ORDER — BUPROPION HCL ER (SR) 150 MG PO TB12
150.0000 mg | ORAL_TABLET | Freq: Two times a day (BID) | ORAL | Status: DC
  Administered 2014-06-21 – 2014-06-22 (×3): 150 mg via ORAL
  Filled 2014-06-21 (×3): qty 1

## 2014-06-21 MED ORDER — ATORVASTATIN CALCIUM 80 MG PO TABS
80.0000 mg | ORAL_TABLET | Freq: Every day | ORAL | Status: DC
  Administered 2014-06-21 – 2014-06-22 (×2): 80 mg via ORAL
  Filled 2014-06-21 (×2): qty 1

## 2014-06-21 NOTE — Progress Notes (Signed)
UR completed 

## 2014-06-21 NOTE — H&P (Signed)
History and Physical  Patient ID: Trevor Griffin MRN: [id number], SOB: Nov 13, 1955 59 y.o. Date of Encounter: 06/21/2014, 12:50 AM  Primary Physician: Scarlette Calico, MD Primary Cardiologist: Dr. Aundra Dubin  Chief Complaint: chest pain  HPI: 59 y.o. male w/ PMHx significant for CAD s/p multiple PCIs, isch CMP with EF recovered to 50%, h/o SCC of tongue, s/p R lobectomy who presented to [facility] on 06/21/2014 with complaints of several days of chest pain. Located in the left upper chest, sharp in nature, occurs at rest, some relief with nitroglycerin, similar to prior when he has hospitalized in the past. Both of his arms become numb like they fell asleep during some of these events.  He reports after his last hospitalization in 10/25 (underwent stress demonstrate peri-infarct ischemia, BB increased), he has done reasonably well and has occasional chest pain but only in the last couple days has it been bad enough to use nitroglycerin.  EKG revealed sinus tach, septal Qs, probable LVH, unchanged from 02/2014.. Labs are significant for negative troponin. D-dimer + led to CT which was neg for PE (see other findings).  Given aspirin, nitro glycerin, nitro patch and morphine for the pain.   Past Medical History  Diagnosis Date  . Tobacco abuse   . Hyperlipidemia   . Ischemic cardiomyopathy     a. Echo 08/20/11 with EF 50-55% (previously 35%);  b. 02/2014 Echo: EF 45-50-%, mid-apicalanteroseptal AK, Gr 1 dd, mild AI, midlly dil LA.  Marland Kitchen Hypertension   . Obesity   . Depression   . CAD (coronary artery disease)     a. Ant STEMI 5/08 with 4 Taxus DES proximal to mid LAD;  b. 06/2007:  Stopped Plavix->anterior STEMI/stent thrombosis in LAD (PTCA), LCX 59m/d;  c. 10/12: Cath/PCI: prox to mid LAD stent ok with 10-20% ISR, mLAD 40%, CFX 30-40%, mCFX 90% (DES), EF 35%;  d. 02/2014 MV: antsept infarct w/ mild peri-infarct ischemia->med rx.  Marland Kitchen GERD (gastroesophageal reflux disease)   . COPD (chronic  obstructive pulmonary disease)   . Alcohol abuse   . Fatigue     sleep test neg 2008  . Palpitations     For event monitor 01/2012  . Hx of radiation therapy 10/25/12-12/10/12     Right Tongue/ Bilateral Neck //Total Dose, 60 Gy in 30 Fractions  . Sleep apnea     not on CPAP  . Headache(784.0)   . Anxiety   . Tongue cancer   . Chronic pain      Surgical History:  Past Surgical History  Procedure Laterality Date  . Cardiac catheterization      ANTERIOR STEMI W/4  OVERLAPPING TAXUS STENTS IN THE PROXIMAL TO MID LAD  . Coronary angioplasty    . Fracture surgery Right     as a child,leg  . Video assisted thoracoscopy (vats)/wedge resection Right 06/06/2013    Procedure: VIDEO ASSISTED THORACOSCOPY (VATS)/WEDGE RESECTION;  Surgeon: Melrose Nakayama, MD;  Location: [place];  Service: Thoracic;  Laterality: Right;  . Lobectomy Right 06/06/2013    Procedure: LOBECTOMY;  Surgeon: Melrose Nakayama, MD;  Location: [place];  Service: Thoracic;  Laterality: Right;     Home Meds: Prior to Admission medications   Medication Sig Start Date End Date Taking? Authorizing Provider  acetaminophen (TYLENOL) 500 MG tablet Take 1,000 mg by mouth every 8 (eight) hours as needed (pain).   Yes Historical Provider, MD  aspirin 81 MG tablet Take 1 tablet (81 mg total) by mouth daily.  02/25/14  Yes Rogelia Mire, NP  atorvastatin (LIPITOR) 80 MG tablet Take 1 tablet (80 mg total) by mouth daily. 02/25/14  Yes Rogelia Mire, NP  buPROPion [facility] SR) 150 MG 12 hr tablet Take 1 tablet (150 mg total) by mouth 2 (two) times daily. 02/25/14  Yes Rogelia Mire, NP  levothyroxine (SYNTHROID, LEVOTHROID) 25 MCG tablet Take 1 tablet (25 mcg total) by mouth daily before breakfast. 06/10/14  Yes [name], MD  metoprolol tartrate (LOPRESSOR) 25 MG tablet Take 0.5 tablets (12.5 mg total) by mouth 2 (two) times daily. 02/25/14  Yes Rogelia Mire, NP  Multiple Vitamins-Minerals (MULTIVITAMIN WITH  MINERALS) tablet Take 1 tablet by mouth daily.     Yes Historical Provider, MD  nitroGLYCERIN (NITROSTAT) 0.4 MG SL tablet Place 1 tablet (0.4 mg total) under the tongue every 5 (five) minutes as needed (up to 3 doses). For chest pain 02/25/14  Yes Rogelia Mire, NP    Allergies:  Allergies  Allergen Reactions  . Hydromorphone Nausea Only and Other (See Comments)    Headaches also  . Nubain [Nalbuphine Hcl] Nausea Only and Other (See Comments)    Headaches also    History   Social History  . Marital Status: Married    Spouse Name: N/A  . Number of Children: N/A  . Years of Education: N/A   Occupational History  . Not on file.   Social History Main Topics  . Smoking status: Current Every Day Smoker -- 0.30 packs/day for 40 years    Types: Cigarettes  . Smokeless tobacco: Never Used     Comment: Stopped smoking cigars day before surgery, 06/09/14 smoking 1/2 pack cigars daily, no cigarettes  . Alcohol Use: Yes     Comment: 8-12 BEERS DAILY prior to surgery.  2 beers daily at present time  . Drug Use: No  . Sexual Activity: Yes   Other Topics Concern  . Not on file   Social History Narrative   Alcoholic beverage:  Yes      Drug use:  No      Seatbead Use: Yes      Firearms in home:  Yes      Exercise:  No      Smoke Alarm in your home: Yes                    Family History  Problem Relation Age of Onset  . Kidney disease Mother     CHRONIC  . Cancer Sister   . Liver disease Sister     LIVER FAILURE    Review of Systems General: negative for chills, fever, night sweats or weight changes.  Cardiovascular: see HPI Respiratory: negative for cough or wheezing Urologic: negative for hematuria Abdominal: negative for nausea, vomiting, diarrhea, bright red blood per rectum, melena, or hematemesis Neurologic: negative for visual changes, syncope, or dizziness All other systems reviewed and are otherwise negative except as noted above.  Labs:   Lab  Results  Component Value Date   WBC 4.2 06/20/2014   HGB 11.5* 06/20/2014   HCT 34.9* 06/20/2014   MCV 79.9 06/20/2014   PLT 203 06/20/2014    Recent Labs Lab 06/20/14 1959  NA 136  K 3.8  CL 101  CO2 23  BUN 6  CREATININE 0.82  CALCIUM 9.2  GLUCOSE 102*    Recent Labs  06/20/14 1959  TROPONINI <0.03   Lab Results  Component Value Date  CHOL 158 08/26/2011   HDL 40 08/26/2011   LDLCALC 94 08/26/2011   TRIG 118 08/26/2011   Lab Results  Component Value Date   DDIMER 0.92* 06/20/2014    Radiology/Studies:  Ct Angio Chest Pe W/cm &/or Wo Cm  06/20/2014   CLINICAL DATA:  Left-sided chest pain for 2-3 days.  EXAM: CT ANGIOGRAPHY CHEST WITH CONTRAST  TECHNIQUE: Multidetector CT imaging of the chest was performed using the standard protocol during bolus administration of intravenous contrast. Multiplanar CT image reconstructions and MIPs were obtained to evaluate the vascular anatomy.  CONTRAST:  167mL OMNIPAQUE IOHEXOL 350 MG/ML SOLN  COMPARISON:  02/23/2014  FINDINGS: Cardiovascular: There is good opacification of the pulmonary vasculature. There is no pulmonary embolism. The thoracic aorta is normal in caliber and intact.  Lungs: There is right lower lobectomy. There is new patchy confluent airspace opacity in the right upper lobe anterior segment. This may represent infectious or inflammatory pneumonitis. Minimal patchy opacity is present in the right upper lobe posterior segment. Left lung is clear.  Central airways: Patent.  Effusions: Trace right pleural effusion.  Lymphadenopathy: None  Esophagus: Small hiatal hernia.  Upper abdomen: Cholelithiasis  Musculoskeletal: No significant abnormalities.  Review of the MIP images confirms the above findings.  IMPRESSION: Negative for pulmonary embolism. Right upper lobe alveolar infiltrate, likely infectious or inflammatory.   Electronically Signed   By: Andreas Newport M.D.   On: 06/20/2014 23:38   Dg Chest Port 1  View  06/20/2014   CLINICAL DATA:  Left-sided chest pain for 2 days, worsening. Shortness of breath and tingling in the left arm.  EXAM: PORTABLE CHEST - 1 VIEW  COMPARISON:  PA and lateral chest and CT chest 02/23/2014.  FINDINGS: Small, chronic right pleural effusion is unchanged. There is no left pleural effusion. Lungs are clear. Heart size is normal. No pneumothorax.  IMPRESSION: No acute finding.  Small, chronic right pleural effusion.   Electronically Signed   By: Inge Rise M.D.   On: 06/20/2014 20:44     EKG: see HPI  Physical Exam: Blood pressure 96/60, pulse 68, temperature 97.6 F (36.4 C), temperature source Axillary, resp. rate 13, height 5\' 8"  (1.727 m), weight 75.297 kg (166 lb), SpO2 99 %. General: in no acute distress. Head: Normocephalic, atraumatic, sclera non-icteric, nares are without discharge, speech altered due to tongue surgery Neck: Supple. Negative for carotid bruits. JVD not elevated. Lungs: Clear bilaterally to auscultation without wheezes, rales, or rhonchi. Breathing is unlabored. Heart: RRR with S1 S2. No murmurs, rubs, or gallops appreciated. Abdomen: Soft, non-tender, non-distended with normoactive bowel sounds. No rebound/guarding. No obvious abdominal masses. Msk:  Strength and tone appear normal for age. Extremities: No edema. No clubbing or cyanosis. Distal pedal pulses are 2+ and equal bilaterally. Neuro: Alert and oriented X 3. Moves all extremities spontaneously. Psych:  Responds to questions appropriately with a normal affect.    Cath 2012: FINAL INTERPRETATION: 1. Single-vessel obstructive coronary artery disease involving the mid  left circumflex coronary artery. 2. Continued patency of stents in the proximal to mid LAD. 3. Severe left ventricular dysfunction. 4. Successful intracoronary stenting of the mid left circumflex  coronary artery with drug-eluting stents.  MPI 02/2014: IMPRESSION: 1. Anteroseptal infarct pattern with  mild peril-infarct ischemia.  2. Akinesis/hypokinesis of septal wall base to apex.  3. Left ventricular ejection fraction 53%  4. Intermediate-risk stress test findings*.   Problem List 1. Chest pain, typical and atypical features 2. Known CAD s/p multiple  PCIs. 3.  Ischm CMP, now with Ef of 50% 4. Tobacco abuse: draws on cigars but doesn't inhale 5. Hypothyroid 6. Abnormal lung findings on CT of RUL infiltrate but is afebrile, no white count, no new cough  ASSESSMENT AND PLAN:  59 y.o. male w/ PMHx significant for CAD s/p multiple PCIs, isch CMP with EF recovered to 50%, h/o SCC of tongue, s/p R lobectomy who presented to [facility] on 06/21/2014 with complaints of several days of chest pain which has both typical and atypical features but more than likely represents his anginal pain.   Encouragingly, EKG is without active changes and troponin is negative despite several days of ongoing pain. On home meds of beta blocker, ASA and statin. Blood pressure is marginal so additional medical therapy of his CAD is rather limited. Because of this, may need to consider repeat catheterization to define anatomy and determine if there are targets to help relieve his angina. Continue nitropaste for now though limited by low BP. Cycle enzymes and keep NPO for possible cath. Hold on heparin unless EKG changes or elevated Biomarkers.  Noted infiltrate on CT but is asymptomatic (chest pain is left sided, infiltrate is RUL) and is afebrile and without a white count. Will hold on empiric treatment at this time.  Advised patient to discontinue all tobacco products. He does not seem to be interested in smoking cessation at this time.  Full code Heparin subq NPO for possible procedure.  Signed, Elias Else, Mariama Saintvil C. MD 06/21/2014, 12:50 AM

## 2014-06-21 NOTE — H&P (View-Only) (Signed)
Subjective:  C/P "sharp" off and on x 3 days, became persistent yesterday prompting admission.   Derrill Memo ON 06/22/2014] aspirin EC  81 mg Oral Daily  . atorvastatin  80 mg Oral Daily  . buPROPion  150 mg Oral BID  . heparin  5,000 Units Subcutaneous 3 times per day  . [START ON 06/22/2014] Influenza vac split quadrivalent PF  0.5 mL Intramuscular Tomorrow-1000  . levothyroxine  25 mcg Oral QAC breakfast  . metoprolol tartrate  12.5 mg Oral BID  . nitroGLYCERIN  1 inch Topical Once  . sodium chloride  3 mL Intravenous Q12H    Objective:  Vital Signs in the last 24 hours: Temp:  [97.5 F (36.4 C)-97.6 F (36.4 C)] 97.5 F (36.4 C) (02/17 0214) Pulse Rate:  [68-102] 78 (02/17 0214) Resp:  [13-21] 18 (02/17 0214) BP: (91-119)/(59-81) 119/74 mmHg (02/17 0214) SpO2:  [97 %-100 %] 98 % (02/17 0214) Weight:  [166 lb (75.297 kg)-170 lb (77.111 kg)] 170 lb (77.111 kg) (02/17 0214)  Intake/Output from previous day:  Intake/Output Summary (Last 24 hours) at 06/21/14 0801 Last data filed at 06/21/14 0500  Gross per 24 hour  Intake      0 ml  Output      0 ml  Net      0 ml    Physical Exam: General appearance: alert, cooperative and no distress Lungs: few rhonchi Heart: regular rate and rhythm   Rate: 78  Rhythm: normal sinus rhythm  Lab Results:  Recent Labs  06/20/14 1959  WBC 4.2  HGB 11.5*  PLT 203    Recent Labs  06/20/14 1959  NA 136  K 3.8  CL 101  CO2 23  GLUCOSE 102*  BUN 6  CREATININE 0.82    Recent Labs  06/20/14 1959 06/21/14 0252  TROPONINI <0.03 <0.03   No results for input(s): INR in the last 72 hours.  Imaging: Ct- negative for PE, positive for RUL infiltrate likely infectious or inflammatory.   Assessment/Plan:   Principal Problem:   Chest pain Active Problems:   CAD S/P multiple PCIs   Cardiomyopathy, ischemic-EF 50% by 2D Oct 2015   Dyslipidemia   HTN (hypertension)   Carcinoma of anterior two-thirds of  tongue   PLAN: Will review with MD, he is NPO this am. He admits to recent "flu"- URI with cough. No fever or leukocytosis noted.   Kerin Ransom PA-C Beeper [phone] 06/21/2014, 8:01 AM   The patient was seen, examined and discussed with Kerin Ransom, PA-C and I agree with the above.   59 y.o. male w/ PMHx significant for CAD s/p multiple PCIs, isch CMP with EF recovered to 50%, h/o SCC of tongue, s/p R lobectomy who presented to [facility] on 06/21/2014 with complaints of several days of chest pain which has both typical and atypical features but more than likely represents his anginal pain. EKG is without active changes and troponin is negative despite several days of ongoing pain.  His CAD history :  a. Ant STEMI 5/08 with 4 Taxus DES proximal to mid LAD; b. 06/2007: Stopped Plavix->anterior STEMI/stent thrombosis in LAD (PTCA), LCX 64m/d; c. 10/12: Cath/PCI: prox to mid LAD stent ok with 10-20% ISR, mLAD 40%, CFX 30-40%, mCFX 90% (DES), EF 35%; d. 02/2014 MV: antsept infarct w/ mild peri-infarct ischemia->med rx.   The patient states that the pain is similar in character to his pain prior to receiving a stent in 2012. He had  milsly abnormal stress test in October 2015. We will schedule a cath for today. Crea is normal.   Dorothy Spark 06/21/2014

## 2014-06-21 NOTE — Interval H&P Note (Signed)
History and Physical Interval Note:  06/21/2014 4:04 PM  Trevor Griffin  has presented today for surgery, with the diagnosis of cp  The various methods of treatment have been discussed with the patient and family. After consideration of risks, benefits and other options for treatment, the patient has consented to  Procedure(s): LEFT HEART CATHETERIZATION WITH CORONARY ANGIOGRAM (N/A) as a surgical intervention .  The patient's history has been reviewed, patient examined, no change in status, stable for surgery.  I have reviewed the patient's chart and labs.  Questions were answered to the patient's satisfaction.     [name]

## 2014-06-21 NOTE — Progress Notes (Signed)
Subjective:  C/P "sharp" off and on x 3 days, became persistent yesterday prompting admission.   Derrill Memo ON 06/22/2014] aspirin EC  81 mg Oral Daily  . atorvastatin  80 mg Oral Daily  . buPROPion  150 mg Oral BID  . heparin  5,000 Units Subcutaneous 3 times per day  . [START ON 06/22/2014] Influenza vac split quadrivalent PF  0.5 mL Intramuscular Tomorrow-1000  . levothyroxine  25 mcg Oral QAC breakfast  . metoprolol tartrate  12.5 mg Oral BID  . nitroGLYCERIN  1 inch Topical Once  . sodium chloride  3 mL Intravenous Q12H    Objective:  Vital Signs in the last 24 hours: Temp:  [97.5 F (36.4 C)-97.6 F (36.4 C)] 97.5 F (36.4 C) (02/17 0214) Pulse Rate:  [68-102] 78 (02/17 0214) Resp:  [13-21] 18 (02/17 0214) BP: (91-119)/(59-81) 119/74 mmHg (02/17 0214) SpO2:  [97 %-100 %] 98 % (02/17 0214) Weight:  [166 lb (75.297 kg)-170 lb (77.111 kg)] 170 lb (77.111 kg) (02/17 0214)  Intake/Output from previous day:  Intake/Output Summary (Last 24 hours) at 06/21/14 0801 Last data filed at 06/21/14 0500  Gross per 24 hour  Intake      0 ml  Output      0 ml  Net      0 ml    Physical Exam: General appearance: alert, cooperative and no distress Lungs: few rhonchi Heart: regular rate and rhythm   Rate: 78  Rhythm: normal sinus rhythm  Lab Results:  Recent Labs  06/20/14 1959  WBC 4.2  HGB 11.5*  PLT 203    Recent Labs  06/20/14 1959  NA 136  K 3.8  CL 101  CO2 23  GLUCOSE 102*  BUN 6  CREATININE 0.82    Recent Labs  06/20/14 1959 06/21/14 0252  TROPONINI <0.03 <0.03   No results for input(s): INR in the last 72 hours.  Imaging: Ct- negative for PE, positive for RUL infiltrate likely infectious or inflammatory.   Assessment/Plan:   Principal Problem:   Chest pain Active Problems:   CAD S/P multiple PCIs   Cardiomyopathy, ischemic-EF 50% by 2D Oct 2015   Dyslipidemia   HTN (hypertension)   Carcinoma of anterior two-thirds of  tongue   PLAN: Will review with MD, he is NPO this am. He admits to recent "flu"- URI with cough. No fever or leukocytosis noted.   Kerin Ransom PA-C Beeper [phone] 06/21/2014, 8:01 AM   The patient was seen, examined and discussed with Kerin Ransom, PA-C and I agree with the above.   59 y.o. male w/ PMHx significant for CAD s/p multiple PCIs, isch CMP with EF recovered to 50%, h/o SCC of tongue, s/p R lobectomy who presented to [facility] on 06/21/2014 with complaints of several days of chest pain which has both typical and atypical features but more than likely represents his anginal pain. EKG is without active changes and troponin is negative despite several days of ongoing pain.  His CAD history :  a. Ant STEMI 5/08 with 4 Taxus DES proximal to mid LAD; b. 06/2007: Stopped Plavix->anterior STEMI/stent thrombosis in LAD (PTCA), LCX 69m/d; c. 10/12: Cath/PCI: prox to mid LAD stent ok with 10-20% ISR, mLAD 40%, CFX 30-40%, mCFX 90% (DES), EF 35%; d. 02/2014 MV: antsept infarct w/ mild peri-infarct ischemia->med rx.   The patient states that the pain is similar in character to his pain prior to receiving a stent in 2012. He had  milsly abnormal stress test in October 2015. We will schedule a cath for today. Crea is normal.   Dorothy Spark 06/21/2014

## 2014-06-21 NOTE — CV Procedure (Signed)
   Cardiac Catheterization Procedure Note  Name: Trevor Griffin MRN: [id number] DOB: [birth year]  Procedure: Left Heart Cath, Selective Coronary Angiography, LV angiography  Indication:  Chest pain with known history of CAD.   Medications:  Sedation:  1 mg IV Versed, 50 mcg IV Fentanyl  Contrast:  70 ml Omnipaque   Procedural Details: The right wrist was prepped, draped, and anesthetized with 1% lidocaine. Using the modified Seldinger technique, a 5 French sheath was introduced into the right radial artery. 3 mg of verapamil was administered through the sheath, weight-based unfractionated heparin was administered intravenously. A Jackie catheter was used for selective coronary angiography. A pigtail catheter was used for left ventriculography. Catheter exchanges were performed over an exchange length guidewire. There were no immediate procedural complications. A TR band was used for radial hemostasis at the completion of the procedure.  The patient was transferred to the post catheterization recovery area for further monitoring.  Procedural Findings:  Hemodynamics: AO:  120/70   mmHg LV:  122/5    mmHg LVEDP: 10  mmHg  Coronary angiography: Coronary dominance: Left   Left Main:  Normal  Left Anterior Descending (LAD):  Normal in size with multiple overlapped stents in the proximal and midsegment with minimal restenosis. There is 30% stenosis distal to the stented area. Otherwise no obstructive disease.  1st diagonal (D1):  Medium in size with minor irregularities.  2nd diagonal (D2):  Medium in size with minor irregularities.  3rd diagonal (D3):  Very small in size with no significant disease.  Circumflex (LCx):  Large in size and dominant. There is 20% stenosis in the midsegment. A stent is noted in the posterior AV groove artery which is patent with no significant restenosis.  1st obtuse marginal:  Medium in size with minor irregularities.  2nd obtuse marginal:  Normal in  size with minor irregularities.  3rd obtuse marginal:  Small in size with minor irregularities.   AV groove continuation segment: Large in size with patent stent. Normal left PDA and posterolateral branches   Right Coronary Artery:Small in size and nondominant. the vessel has no significant disease.   Left ventriculography: Left ventricular systolic function is mildly to moderately , LVEF is estimated at 40  %, there is no  significant mitral regurgitation . Severe hypokinesis of the distal anterior wall and apical wall.  Final Conclusions:   1. No evidence of obstructive coronary artery disease. Patent stents in the LAD and left circumflex without significant restenosis.  2. Mildly to moderately reduced LV systolic function with an ejection fraction of 40% due to post MI cardiomyopathy. Normal left ventricular end-diastolic pressure.   Recommendations:  Continue medical therapy.    [name] MD, [facility] 06/21/2014, 4:36 PM

## 2014-06-22 ENCOUNTER — Encounter (HOSPITAL_COMMUNITY): Payer: Self-pay | Admitting: Cardiovascular Disease

## 2014-06-22 DIAGNOSIS — E785 Hyperlipidemia, unspecified: Secondary | ICD-10-CM

## 2014-06-22 DIAGNOSIS — R072 Precordial pain: Secondary | ICD-10-CM

## 2014-06-22 MED ORDER — ANGIOPLASTY BOOK
Freq: Once | Status: AC
  Administered 2014-06-22: 11:00:00
  Filled 2014-06-22: qty 1

## 2014-06-22 MED ORDER — ACTIVE PARTNERSHIP FOR HEALTH OF YOUR HEART BOOK
Freq: Once | Status: AC
  Administered 2014-06-22: 11:00:00
  Filled 2014-06-22: qty 1

## 2014-06-22 MED ORDER — CHRONIC LUNG DISEASE PATIENT EDUCATION BOOK
Freq: Once | Status: DC
  Filled 2014-06-22: qty 1

## 2014-06-22 NOTE — Discharge Instructions (Addendum)
Chest Pain (Nonspecific) °It is often hard to give a specific diagnosis for the cause of chest pain. There is always a chance that your pain could be related to something serious, such as a heart attack or a blood clot in the lungs. You need to follow up with your health care provider for further evaluation. °CAUSES  °· Heartburn. °· Pneumonia or bronchitis. °· Anxiety or stress. °· Inflammation around your heart (pericarditis) or lung (pleuritis or pleurisy). °· A blood clot in the lung. °· A collapsed lung (pneumothorax). It can develop suddenly on its own (spontaneous pneumothorax) or from trauma to the chest. °· Shingles infection (herpes zoster virus). °The chest wall is composed of bones, muscles, and cartilage. Any of these can be the source of the pain. °· The bones can be bruised by injury. °· The muscles or cartilage can be strained by coughing or overwork. °· The cartilage can be affected by inflammation and become sore (costochondritis). °DIAGNOSIS  °Lab tests or other studies may be needed to find the cause of your pain. Your health care provider may have you take a test called an ambulatory electrocardiogram (ECG). An ECG records your heartbeat patterns over a 24-hour period. You may also have other tests, such as: °· Transthoracic echocardiogram (TTE). During echocardiography, sound waves are used to evaluate how blood flows through your heart. °· Transesophageal echocardiogram (TEE). °· Cardiac monitoring. This allows your health care provider to monitor your heart rate and rhythm in real time. °· Holter monitor. This is a portable device that records your heartbeat and can help diagnose heart arrhythmias. It allows your health care provider to track your heart activity for several days, if needed. °· Stress tests by exercise or by giving medicine that makes the heart beat faster. °TREATMENT  °· Treatment depends on what may be causing your chest pain. Treatment may include: °¨ Acid blockers for  heartburn. °¨ Anti-inflammatory medicine. °¨ Pain medicine for inflammatory conditions. °¨ Antibiotics if an infection is present. °· You may be advised to change lifestyle habits. This includes stopping smoking and avoiding alcohol, caffeine, and chocolate. °· You may be advised to keep your head raised (elevated) when sleeping. This reduces the chance of acid going backward from your stomach into your esophagus. °Most of the time, nonspecific chest pain will improve within 2-3 days with rest and mild pain medicine.  °HOME CARE INSTRUCTIONS  °· If antibiotics were prescribed, take them as directed. Finish them even if you start to feel better. °· For the next few days, avoid physical activities that bring on chest pain. Continue physical activities as directed. °· Do not use any tobacco products, including cigarettes, chewing tobacco, or electronic cigarettes. °· Avoid drinking alcohol. °· Only take medicine as directed by your health care provider. °· Follow your health care provider's suggestions for further testing if your chest pain does not go away. °· Keep any follow-up appointments you made. If you do not go to an appointment, you could develop lasting (chronic) problems with pain. If there is any problem keeping an appointment, call to reschedule. °SEEK MEDICAL CARE IF:  °· Your chest pain does not go away, even after treatment. °· You have a rash with blisters on your chest. °· You have a fever. °SEEK IMMEDIATE MEDICAL CARE IF:  °· You have increased chest pain or pain that spreads to your arm, neck, jaw, back, or abdomen. °· You have shortness of breath. °· You have an increasing cough, or you cough   up blood.  You have severe back or abdominal pain.  You feel nauseous or vomit.  You have severe weakness.  You faint.  You have chills. This is an emergency. Do not wait to see if the pain will go away. Get medical help at once. Call your local emergency services (911 in [place] Do not drive  yourself to the hospital. MAKE SURE YOU:   Understand these instructions.  Will watch your condition.  Will get help right away if you are not doing well or get worse. Document Released: 01/29/2005 Document Revised: 04/26/2013 Document Reviewed: 11/25/2007 [facility] Patient Information 2015 [place], [place]. This information is not intended to replace advice given to you by your health care provider. Make sure you discuss any questions you have with your health care provider.  No driving for 24 hours. No lifting over 5 lbs for 1 week. No sexual activity for 1 week. You may return to work on 06/26/2014. Keep procedure site clean & dry. If you notice increased pain, swelling, bleeding or pus, call/return!  You may shower, but no soaking baths/hot tubs/pools for 1 week.

## 2014-06-22 NOTE — Progress Notes (Signed)
Patient Name: Trevor Griffin [facility] Date of Encounter: 06/22/2014     Principal Problem:   Chest pain Active Problems:   Dyslipidemia   CAD S/P multiple PCIs   Cardiomyopathy, ischemic-EF 50% by 2D Oct 2015   HTN (hypertension)   Carcinoma of anterior two-thirds of tongue    SUBJECTIVE  No CP or SOB overnight.   CURRENT MEDS . aspirin EC  81 mg Oral Daily  . atorvastatin  80 mg Oral Daily  . buPROPion  150 mg Oral BID  . heparin  5,000 Units Subcutaneous 3 times per day  . levothyroxine  25 mcg Oral QAC breakfast  . metoprolol tartrate  12.5 mg Oral BID  . nitroGLYCERIN  1 inch Topical Once  . sodium chloride  3 mL Intravenous Q12H    OBJECTIVE  Filed Vitals:   06/21/14 1800 06/21/14 2000 06/21/14 2200 06/22/14 0500  BP: 100/55 98/55  105/62  Pulse:  63 60 69  Temp:  99.7 F (37.6 C)  98.2 F (36.8 C)  TempSrc:      Resp:  16  14  Height:      Weight:    171 lb 14.4 oz (77.973 kg)  SpO2:  97%  97%    Intake/Output Summary (Last 24 hours) at 06/22/14 1024 Last data filed at 06/22/14 1000  Gross per 24 hour  Intake   1083 ml  Output    500 ml  Net    583 ml   Filed Weights   06/20/14 1946 06/21/14 0214 06/22/14 0500  Weight: 166 lb (75.297 kg) 170 lb (77.111 kg) 171 lb 14.4 oz (77.973 kg)    PHYSICAL EXAM  General: Pleasant, NAD. Neuro: Alert and oriented X 3. Moves all extremities spontaneously. Psych: Normal affect. HEENT:  Normal  Neck: Supple without bruits or JVD. Lungs:  Resp regular and unlabored, CTA. Heart: RRR no s3, s4, or murmurs. R radial cath site stable.  Abdomen: Soft, non-tender, non-distended, BS + x 4.  Extremities: No clubbing, cyanosis or edema. DP/PT/Radials 2+ and equal bilaterally.  Accessory Clinical Findings  CBC  Recent Labs  06/20/14 1959  WBC 4.2  HGB 11.5*  HCT 34.9*  MCV 79.9  PLT 332   Basic Metabolic Panel  Recent Labs  06/20/14 1959 06/21/14 0814  NA 136 137  K 3.8 4.1  CL 101 101  CO2 23 29    GLUCOSE 102* 92  BUN 6 7  CREATININE 0.82 0.96  CALCIUM 9.2 9.3   Cardiac Enzymes  Recent Labs  06/21/14 0252 06/21/14 0814 06/21/14 1424  TROPONINI <0.03 <0.03 <0.03   BNP Invalid input(s): POCBNP D-Dimer  Recent Labs  06/20/14 2020  DDIMER 0.92*    TELE NSR with HR 60-70s    ECG  No new EKG  Echocardiogram  LV EF: 45% -  50%  ------------------------------------------------------------------- Indications:   Chest pain 786.51. Cardiomyopathy - ischemic 414.8.  ------------------------------------------------------------------- History:  PMH:  Coronary artery disease. Risk factors: Hypertension.  ------------------------------------------------------------------- Study Conclusions  - Left ventricle: The cavity size was normal. Systolic function was mildly reduced. The estimated ejection fraction was in the range of 45% to 50%. There is akinesis of the mid-apicalanteroseptal myocardium. Doppler parameters are consistent with abnormal left ventricular relaxation (grade 1 diastolic dysfunction). - Aortic valve: There was mild regurgitation. - Left atrium: The atrium was mildly dilated.    Radiology/Studies  Ct Angio Chest Pe W/cm &/or Wo Cm  06/20/2014   CLINICAL DATA:  Left-sided chest pain  for 2-3 days.  EXAM: CT ANGIOGRAPHY CHEST WITH CONTRAST  TECHNIQUE: Multidetector CT imaging of the chest was performed using the standard protocol during bolus administration of intravenous contrast. Multiplanar CT image reconstructions and MIPs were obtained to evaluate the vascular anatomy.  CONTRAST:  139mL OMNIPAQUE IOHEXOL 350 MG/ML SOLN  COMPARISON:  02/23/2014  FINDINGS: Cardiovascular: There is good opacification of the pulmonary vasculature. There is no pulmonary embolism. The thoracic aorta is normal in caliber and intact.  Lungs: There is right lower lobectomy. There is new patchy confluent airspace opacity in the right upper lobe anterior  segment. This may represent infectious or inflammatory pneumonitis. Minimal patchy opacity is present in the right upper lobe posterior segment. Left lung is clear.  Central airways: Patent.  Effusions: Trace right pleural effusion.  Lymphadenopathy: None  Esophagus: Small hiatal hernia.  Upper abdomen: Cholelithiasis  Musculoskeletal: No significant abnormalities.  Review of the MIP images confirms the above findings.  IMPRESSION: Negative for pulmonary embolism. Right upper lobe alveolar infiltrate, likely infectious or inflammatory.   Electronically Signed   By: Andreas Newport M.D.   On: 06/20/2014 23:38   Dg Chest Port 1 View  06/20/2014   CLINICAL DATA:  Left-sided chest pain for 2 days, worsening. Shortness of breath and tingling in the left arm.  EXAM: PORTABLE CHEST - 1 VIEW  COMPARISON:  PA and lateral chest and CT chest 02/23/2014.  FINDINGS: Small, chronic right pleural effusion is unchanged. There is no left pleural effusion. Lungs are clear. Heart size is normal. No pneumothorax.  IMPRESSION: No acute finding.  Small, chronic right pleural effusion.   Electronically Signed   By: Inge Rise M.D.   On: 06/20/2014 20:44    ASSESSMENT AND PLAN  1. Chest pain with both typical and atypical features  - CTA of chest 06/20/2014 negative for PE, RUL infiltrate likely infectious or inflammatory  - cath 06/21/2014 nonobstructive CAD, patent stents in LAD and LCx, EF 40%. Continue medical therapy  - continue ASA, statin and BB. BP does not allow uptitration of BB or addition of Imdur  - given CTA result, ?pulmonary etiology, WBC normal, no fever, however patient likely need PCP eval if develop fever later.    2. CAD s/p multiple PCI  3. Ischemic cardiomyopathy  - previously EF recovered to 50%  - EF 40% during cath 06/21/2014  4. HTN 5. HLD 6. Carcinoma of anterior 2/3 of tongue  Signed, Woodward Ku Pager: [phone]   The patient was seen, examined and discussed with Almyra Deforest,  PA-C and I agree with the above.   59 y.o. male w/ PMHx significant for CAD s/p multiple PCIs, isch CMP with EF recovered to 50%, h/o SCC of tongue, s/p R lobectomy who presented to [facility] on 06/21/2014 with complaints of several days of chest pain which has both typical and atypical features but more than likely represents his anginal pain. EKG is without active changes and troponin is negative despite several days of ongoing pain.  Cath yesterday showed moderate LV dysfunction and no evidence of obstructive coronary artery disease. Patent stents in the LAD and left circumflex without significant restenosis. Continue medical therapy. BP is soft and doesn't allow to add long acting nitro like imdur or amlodipine, we will consider ranolazine if he continues to be symptomatic. Discharge home today.  Dorothy Spark 06/22/2014

## 2014-06-22 NOTE — Discharge Summary (Signed)
Discharge Summary   Patient ID: Trevor Griffin,  MRN: [id number], DOB/AGE: [birth year] 59 y.o.  Admit date: 06/20/2014 Discharge date: 06/22/2014  Primary Care Provider: Scarlette Calico Primary Cardiologist: Dr. Aundra Dubin  Discharge Diagnoses Principal Problem:   Chest pain Active Problems:   Dyslipidemia   CAD S/P multiple PCIs   Cardiomyopathy, ischemic-EF 50% by 2D Oct 2015   HTN (hypertension)   Carcinoma of anterior two-thirds of tongue   Allergies Allergies  Allergen Reactions  . Hydromorphone Nausea Only and Other (See Comments)    Headaches also  . Nubain [Nalbuphine Hcl] Nausea Only and Other (See Comments)    Headaches also    Procedures  CTA of chest   06/20/2014 IMPRESSION: Negative for pulmonary embolism. Right upper lobe alveolar infiltrate, likely infectious or inflammatory.     Cardiac catheterization 06/21/2014 Left ventriculography: Left ventricular systolic function is mildly to moderately , LVEF is estimated at 40 %, there is no significant mitral regurgitation . Severe hypokinesis of the distal anterior wall and apical wall.  Final Conclusions:  1. No evidence of obstructive coronary artery disease. Patent stents in the LAD and left circumflex without significant restenosis.  2. Mildly to moderately reduced LV systolic function with an ejection fraction of 40% due to post MI cardiomyopathy. Normal left ventricular end-diastolic pressure.   Recommendations:  Continue medical therapy.     Hospital Course  The patient is a 59 year old Caucasian male with past medical history significant for CAD status post multiple PCI, history of ischemic cardiomyopathy with improved EF 50%, history of squamous cell carcinoma of the tongue, status post right lobectomy who presented to [facility] on 06/23/2014 with complaint of several days of chest pain. He described it as left-sided chest pain, sharp in nature, occurring at rest with some relief with  nitroglycerin. Initial labs were significant for negative troponin, positive d-dimer. CTA of the chest was negative for PE. CT of the chest did reveal infiltrate in the right upper lobe concerning for inflammatory process.  Overnight, serial troponin was negative. Patient was seen on the following morning, at which time she states the episodes of chest pain were similar to his previous chest pain prior to his previous PCI. Given his significant coronary history, cardiac catheterization was recommended. He underwent went to cardiac catheterization in the afternoon of 06/21/2014 which showed no evidence of obstructive coronary artery disease, patent stent in the LAD and left circumflex without significant restenosis, mild to moderate reduced LV function with EF 40%, normal left ventricular end-diastolic pressure.  Patient was seen in the morning of 06/22/2014, at which time he denies any significant chest pain or shortness of breath. He is deemed stable for discharge from cardiology perspective. I have arranged follow-up with Dr. Aundra Dubin in the office in 2-4 weeks. I have also instructed the patient to contact his PCP if has worsening productive cough or fever or chill given the recent CT finding. At this time, his white blood cell count is normal and he has been afebrile throughout the entire admission, we will hold off on any antibiotic treatment for now.   Discharge Vitals Blood pressure 105/62, pulse 69, temperature 98.2 F (36.8 C), temperature source Oral, resp. rate 14, height 5\' 8"  (1.727 m), weight 171 lb 14.4 oz (77.973 kg), SpO2 97 %.  Filed Weights   06/20/14 1946 06/21/14 0214 06/22/14 0500  Weight: 166 lb (75.297 kg) 170 lb (77.111 kg) 171 lb 14.4 oz (77.973 kg)    Labs  CBC  Recent Labs  06/20/14 1959  WBC 4.2  HGB 11.5*  HCT 34.9*  MCV 79.9  PLT 837   Basic Metabolic Panel  Recent Labs  06/20/14 1959 06/21/14 0814  NA 136 137  K 3.8 4.1  CL 101 101  CO2 23 29  GLUCOSE  102* 92  BUN 6 7  CREATININE 0.82 0.96  CALCIUM 9.2 9.3   Cardiac Enzymes  Recent Labs  06/21/14 0252 06/21/14 0814 06/21/14 1424  TROPONINI <0.03 <0.03 <0.03   BNP Invalid input(s): POCBNP D-Dimer  Recent Labs  06/20/14 2020  DDIMER 0.92*    Disposition  Pt is being discharged home today in good condition.  Follow-up Plans & Appointments      Follow-up Information    Follow up with Loralie Champagne, MD.   Specialty:  Cardiology   Why:  Office will contact you to arrange followup, please give [place] a call if you do not hear from [place] in 2 business days   Contact information:   [address] [place] [address] [phone]       Discharge Medications    Medication List    ASK your doctor about these medications        acetaminophen 500 MG tablet  Commonly known as:  TYLENOL  Take 1,000 mg by mouth every 8 (eight) hours as needed (pain).     aspirin 81 MG tablet  Take 1 tablet (81 mg total) by mouth daily.     atorvastatin 80 MG tablet  Commonly known as:  LIPITOR  Take 1 tablet (80 mg total) by mouth daily.     buPROPion 150 MG 12 hr tablet  Commonly known as:  WELLBUTRIN SR  Take 1 tablet (150 mg total) by mouth 2 (two) times daily.     levothyroxine 25 MCG tablet  Commonly known as:  SYNTHROID, LEVOTHROID  Take 1 tablet (25 mcg total) by mouth daily before breakfast.     metoprolol tartrate 25 MG tablet  Commonly known as:  LOPRESSOR  Take 0.5 tablets (12.5 mg total) by mouth 2 (two) times daily.     multivitamin with minerals tablet  Take 1 tablet by mouth daily.     nitroGLYCERIN 0.4 MG SL tablet  Commonly known as:  NITROSTAT  Place 1 tablet (0.4 mg total) under the tongue every 5 (five) minutes as needed (up to 3 doses). For chest pain         Duration of Discharge Encounter   Greater than 30 minutes including physician time.  Hilbert Corrigan PA-C Pager: [phone] 06/22/2014, 11:46 AM   The patient was  seen, examined and discussed with Almyra Deforest, PA-C and I agree with the above.   Dorothy Spark, MD 06/22/2014

## 2014-06-22 NOTE — Progress Notes (Signed)
UR completed 

## 2014-06-23 ENCOUNTER — Encounter: Payer: Self-pay | Admitting: Cardiology

## 2014-06-29 ENCOUNTER — Ambulatory Visit (HOSPITAL_COMMUNITY): Payer: Self-pay

## 2014-06-29 ENCOUNTER — Ambulatory Visit: Payer: Self-pay

## 2014-07-05 ENCOUNTER — Ambulatory Visit (HOSPITAL_COMMUNITY): Payer: MEDICAID

## 2014-07-05 ENCOUNTER — Ambulatory Visit: Payer: Self-pay

## 2014-07-13 ENCOUNTER — Encounter (HOSPITAL_COMMUNITY): Payer: Self-pay

## 2014-07-13 ENCOUNTER — Ambulatory Visit
Admission: RE | Admit: 2014-07-13 | Discharge: 2014-07-13 | Disposition: A | Discharge status: Home / Self Care | Payer: Self-pay | Source: Ambulatory Visit | Attending: Radiation Oncology | Admitting: Radiation Oncology

## 2014-07-13 ENCOUNTER — Ambulatory Visit (HOSPITAL_COMMUNITY)
Admission: RE | Admit: 2014-07-13 | Discharge: 2014-07-13 | Disposition: A | Discharge status: Home / Self Care | Payer: Medicaid Other | Source: Ambulatory Visit | Attending: Radiation Oncology | Admitting: Radiation Oncology

## 2014-07-13 DIAGNOSIS — C023 Malignant neoplasm of anterior two-thirds of tongue, part unspecified: Secondary | ICD-10-CM | POA: Insufficient documentation

## 2014-07-13 DIAGNOSIS — I2510 Atherosclerotic heart disease of native coronary artery without angina pectoris: Secondary | ICD-10-CM | POA: Insufficient documentation

## 2014-07-13 DIAGNOSIS — Z902 Acquired absence of lung [part of]: Secondary | ICD-10-CM | POA: Insufficient documentation

## 2014-07-13 DIAGNOSIS — I252 Old myocardial infarction: Secondary | ICD-10-CM | POA: Diagnosis not present

## 2014-07-13 DIAGNOSIS — E038 Other specified hypothyroidism: Secondary | ICD-10-CM

## 2014-07-13 DIAGNOSIS — K8020 Calculus of gallbladder without cholecystitis without obstruction: Secondary | ICD-10-CM | POA: Diagnosis not present

## 2014-07-13 DIAGNOSIS — Z923 Personal history of irradiation: Secondary | ICD-10-CM | POA: Diagnosis not present

## 2014-07-13 DIAGNOSIS — I700 Atherosclerosis of aorta: Secondary | ICD-10-CM | POA: Insufficient documentation

## 2014-07-13 DIAGNOSIS — R911 Solitary pulmonary nodule: Secondary | ICD-10-CM | POA: Insufficient documentation

## 2014-07-13 LAB — BUN AND CREATININE (CC13)
BUN: 11.1 mg/dL (ref 7.0–26.0)
Creatinine: 0.8 mg/dL (ref 0.7–1.3)
EGFR: >90 mL/min/{1.73_m2} (ref >90)

## 2014-07-13 LAB — TSH CHCC: TSH: 5.62 m[IU]/L — AB (ref 0.320–4.118)

## 2014-07-13 MED ORDER — IOHEXOL 300 MG/ML  SOLN
80.0000 mL | Freq: Once | INTRAMUSCULAR | Status: AC | PRN
  Administered 2014-07-13: 80 mL via INTRAVENOUS

## 2014-07-18 ENCOUNTER — Telehealth: Payer: Self-pay | Admitting: *Deleted

## 2014-07-18 ENCOUNTER — Other Ambulatory Visit: Payer: Self-pay | Admitting: Radiation Oncology

## 2014-07-18 DIAGNOSIS — C023 Malignant neoplasm of anterior two-thirds of tongue, part unspecified: Secondary | ICD-10-CM

## 2014-07-18 DIAGNOSIS — E038 Other specified hypothyroidism: Secondary | ICD-10-CM

## 2014-07-18 MED ORDER — LEVOTHYROXINE SODIUM 50 MCG PO TABS: 50.0000 ug | ORAL_TABLET | Freq: Every day | ORAL | Status: DC

## 2014-07-18 NOTE — Telephone Encounter (Signed)
Per Dr Isidore Moos, called patient and informed him that his CT scan of chest "looks good". Also informed him Dr Isidore Moos needs to increase his Levothyroxine dose. He requests prescription be called to [facility] outpatient pharmacy. Informed him this RN will call it in. Patient verbalized thanks and understanding.

## 2014-07-21 ENCOUNTER — Ambulatory Visit: Payer: Self-pay

## 2014-12-12 ENCOUNTER — Telehealth: Payer: Self-pay | Admitting: *Deleted

## 2014-12-12 NOTE — Telephone Encounter (Signed)
Called patient to inform that lab and test have been scheduled for Aug. 11 and his fu to get results on Aug. 12, lvm for a return call.

## 2014-12-13 ENCOUNTER — Ambulatory Visit
Admission: RE | Admit: 2014-12-13 | Discharge: 2014-12-13 | Disposition: A | Discharge status: Home / Self Care | Payer: MEDICAID | Source: Ambulatory Visit | Attending: Radiation Oncology | Admitting: Radiation Oncology

## 2014-12-13 ENCOUNTER — Ambulatory Visit: Payer: Self-pay

## 2014-12-14 ENCOUNTER — Ambulatory Visit
Admission: RE | Admit: 2014-12-14 | Discharge: 2014-12-14 | Disposition: A | Discharge status: Home / Self Care | Payer: Self-pay | Source: Ambulatory Visit | Attending: Radiation Oncology | Admitting: Radiation Oncology

## 2014-12-14 ENCOUNTER — Ambulatory Visit (HOSPITAL_COMMUNITY)
Admission: RE | Admit: 2014-12-14 | Discharge: 2014-12-14 | Disposition: A | Discharge status: Home / Self Care | Payer: Medicaid Other | Source: Ambulatory Visit | Attending: Radiation Oncology | Admitting: Radiation Oncology

## 2014-12-14 ENCOUNTER — Encounter (HOSPITAL_COMMUNITY): Payer: Self-pay

## 2014-12-14 DIAGNOSIS — E038 Other specified hypothyroidism: Secondary | ICD-10-CM

## 2014-12-14 DIAGNOSIS — I700 Atherosclerosis of aorta: Secondary | ICD-10-CM | POA: Diagnosis not present

## 2014-12-14 DIAGNOSIS — C3431 Malignant neoplasm of lower lobe, right bronchus or lung: Secondary | ICD-10-CM | POA: Diagnosis not present

## 2014-12-14 DIAGNOSIS — Z720 Tobacco use: Secondary | ICD-10-CM | POA: Insufficient documentation

## 2014-12-14 DIAGNOSIS — Z923 Personal history of irradiation: Secondary | ICD-10-CM | POA: Insufficient documentation

## 2014-12-14 DIAGNOSIS — K8020 Calculus of gallbladder without cholecystitis without obstruction: Secondary | ICD-10-CM | POA: Diagnosis not present

## 2014-12-14 DIAGNOSIS — R918 Other nonspecific abnormal finding of lung field: Secondary | ICD-10-CM

## 2014-12-14 DIAGNOSIS — F17200 Nicotine dependence, unspecified, uncomplicated: Secondary | ICD-10-CM

## 2014-12-14 DIAGNOSIS — C023 Malignant neoplasm of anterior two-thirds of tongue, part unspecified: Secondary | ICD-10-CM | POA: Insufficient documentation

## 2014-12-14 LAB — TSH CHCC: TSH: 8.168 m(IU)/L — ABNORMAL HIGH (ref 0.320–4.118)

## 2014-12-14 MED ORDER — IOHEXOL 300 MG/ML  SOLN
75.0000 mL | Freq: Once | INTRAMUSCULAR | Status: AC | PRN
  Administered 2014-12-14: 75 mL via INTRAVENOUS

## 2014-12-15 ENCOUNTER — Encounter: Payer: Self-pay | Admitting: Radiation Oncology

## 2014-12-15 ENCOUNTER — Ambulatory Visit
Admission: RE | Admit: 2014-12-15 | Discharge: 2014-12-15 | Disposition: A | Discharge status: Home / Self Care | Payer: Self-pay | Source: Ambulatory Visit | Attending: Radiation Oncology | Admitting: Radiation Oncology

## 2014-12-15 VITALS — BP 140/82 | HR 97 | Temp 97.9°F | Resp 12 | Ht 68.0 in | Wt 191.3 lb

## 2014-12-15 DIAGNOSIS — C023 Malignant neoplasm of anterior two-thirds of tongue, part unspecified: Secondary | ICD-10-CM

## 2014-12-15 DIAGNOSIS — E039 Hypothyroidism, unspecified: Secondary | ICD-10-CM

## 2014-12-15 DIAGNOSIS — E038 Other specified hypothyroidism: Secondary | ICD-10-CM

## 2014-12-15 MED ORDER — LEVOTHYROXINE SODIUM 75 MCG PO TABS: 75.0000 ug | ORAL_TABLET | Freq: Every day | ORAL | Status: DC

## 2014-12-15 NOTE — Progress Notes (Signed)
Radiation Oncology         [phone] ________________________________  Name: Trevor Griffin MRN: [id number]  Date: 12/15/2014  DOB: 19-May-1955  Follow-Up Visit Note  CC: Trevor Calico, MD  Philomena Doheny, MD   ICD-9-CM ICD-10-CM   1. Carcinoma of anterior two-thirds of tongue 141.4 C02.3 BUN     Creatinine, serum     Ambulatory referral to Physical Therapy     TSH ([facility])     CT Chest W Contrast     levothyroxine (SYNTHROID, LEVOTHROID) 75 MCG tablet  2. Other specified hypothyroidism 244.8 E03.8 levothyroxine (SYNTHROID, LEVOTHROID) 75 MCG tablet  3. Hypothyroidism, unspecified hypothyroidism type 244.9 E03.9 TSH ([facility])     levothyroxine (SYNTHROID, LEVOTHROID) 75 MCG tablet    Diagnosis and Prior Radiotherapy:  Pathologic T3 N1 squamous cell carcinoma; right oral tongue; grade 2  Indication for treatment: Postoperative, curative  Radiation treatment dates: 10/25/2012-12/10/2012  Site/dose: Right Tongue/ Bilateral Neck /// Total Dose, 60 Gy in 30 Fractions   pT1b, pN0 Right lower lobe squamous cell carcinoma, s/p wedge resection on 06-06-13   Narrative:  The patient returns today for routine follow-up.  Pain Status: reports chronic pain in his lower back and knees. Nutritional Status  a) intake: reports he can't eat meat and tomatoes  b) using a feeding tube?: no  c) weight changes, if any: has gained 20 lbs since 06/22/14  Wt Readings from Last 3 Encounters:   06/22/14  171 lb 14.4 oz (77.973 kg)   06/09/14  173 lb 6.4 oz (78.654 kg)   02/25/14  158 lb 8.2 oz (71.9 kg)   Swallowing Status: Pt reports his throat feels more narrow.  Smoking or chewing tobacco? Smokes 1/2 ppd.  Dental (if applicable): When was last visit with dentistry: has dentures    Imaging done in the last month (if applicable) revealed: Ct of chest on 12/14/14 - NED  Other notable issues, if any: Reports having a dry mouth, reports the front side of this tongue stings and has since surgery. He is not  able to eat any spicy foods. Reports shortness of breath with activity. He reports having a dry cough. He denies hemoptysis. He reports fatigue.  ALLERGIES:  is allergic to hydromorphone and nubain.  Meds: Current Outpatient Prescriptions  Medication Sig Dispense Refill  . aspirin 81 MG tablet Take 1 tablet (81 mg total) by mouth daily. (Patient taking differently: Take 325 mg by mouth daily. ) 30 tablet 1  . atorvastatin (LIPITOR) 80 MG tablet Take 1 tablet (80 mg total) by mouth daily. 30 tablet 6  . buPROPion (WELLBUTRIN SR) 150 MG 12 hr tablet Take 1 tablet (150 mg total) by mouth 2 (two) times daily. 60 tablet 1  . levothyroxine (SYNTHROID, LEVOTHROID) 75 MCG tablet Take 1 tablet (75 mcg total) by mouth daily before breakfast. 30 tablet 6  . metoprolol tartrate (LOPRESSOR) 25 MG tablet Take 0.5 tablets (12.5 mg total) by mouth 2 (two) times daily. 30 tablet 6  . acetaminophen (TYLENOL) 500 MG tablet Take 1,000 mg by mouth every 8 (eight) hours as needed (pain).    . Multiple Vitamins-Minerals (MULTIVITAMIN WITH MINERALS) tablet Take 1 tablet by mouth daily.      . nitroGLYCERIN (NITROSTAT) 0.4 MG SL tablet Place 1 tablet (0.4 mg total) under the tongue every 5 (five) minutes as needed (up to 3 doses). For chest pain (Patient not taking: Reported on 12/15/2014) 25 tablet 3   No current facility-administered medications for  this encounter.    Physical Findings: The patient is in no acute distress. Patient is alert and oriented.  height is '5\' 8"'$  (1.727 m) and weight is 191 lb 4.8 oz (86.773 kg). His oral temperature is 97.9 F (36.6 C). His blood pressure is 140/82 and his pulse is 97. His respiration is 12 and oxygen saturation is 100%. Dentures removed for exam.  Oral mucosa is moist with no thrush or lesions.   No lesions or nodularity palpated on the pt's reconstructed tongue.  Post treatment fibrosis over the right side of his neck.  No palpable neck nodes.  Well healed scar over his  left forearm from his tissue graft.  No edema in his extremities.  Lungs CTAB.  Heart RRR.  Lab Findings: Lab Results  Component Value Date   WBC 4.2 06/20/2014   HGB 11.5* 06/20/2014   HCT 34.9* 06/20/2014   MCV 79.9 06/20/2014   PLT 203 06/20/2014   CMP     Component Value Date/Time   NA 137 06/21/2014 0814   K 4.1 06/21/2014 0814   CL 101 06/21/2014 0814   CO2 29 06/21/2014 0814   GLUCOSE 92 06/21/2014 0814   BUN 11.1 07/13/2014 0931   BUN 7 06/21/2014 0814   CREATININE 0.8 07/13/2014 0931   CREATININE 0.96 06/21/2014 0814   CREATININE 0.86 01/21/2012 1418   CALCIUM 9.3 06/21/2014 0814   PROT 5.8* 06/08/2013 0410   ALBUMIN 2.6* 06/08/2013 0410   AST 26 06/08/2013 0410   ALT 20 06/08/2013 0410   ALKPHOS 75 06/08/2013 0410   BILITOT 0.6 06/08/2013 0410   GFRNONAA 90* 06/21/2014 0814   GFRAA >90 06/21/2014 0814     Lab Results  Component Value Date   TSH 8.168* 12/14/2014    Radiographic Findings: Ct Chest W Contrast  12/14/2014   CLINICAL DATA:  Anterior tongue carcinoma post radiation therapy completed in 2014. 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 Subsequent encounter.  EXAM: CT CHEST WITH CONTRAST  TECHNIQUE: Multidetector CT imaging of the chest was performed during intravenous contrast administration.  CONTRAST:  9m OMNIPAQUE IOHEXOL 300 MG/ML  SOLN  COMPARISON:  CTs 07/13/2014 and 06/20/2014.  FINDINGS: Mediastinum/Nodes: Small mediastinal and axillary lymph nodes are stable. The thyroid gland and esophagus appear unchanged. The heart size is normal. There is no pericardial effusion. Stable lipomatosis hypertrophy of the interatrial septum and atherosclerosis of the aorta, great vessels and coronary arteries.  Lungs/Pleura: There is no pleural effusion. There is stable volume loss in the right hemithorax status post right lower lobe resection. There is stable peripheral scarring and mild pleural thickening in the right lung.  3 mm left upper lobe nodule on image 20 is stable. No new or enlarging nodules.  Upper abdomen: Stable appearance with probable gallstones and hepatic steatosis. No adrenal mass.  Musculoskeletal/Chest wall: There is no chest wall mass or suspicious osseous finding.  IMPRESSION: 1. Stable postoperative chest CT. 2. No evidence of local recurrence or metastatic disease. 3. Stable postsurgical scarring in the right lung. 4. Atherosclerosis and gallstones again noted.   Electronically Signed   By: WRichardean SaleM.D.   On: 12/14/2014 13:22    Impression/Plan:    1) Head and Neck Cancer Status: CT scan shows no recurrence.  2) Nutritional Status: no active issues  3) Risk Factors: The patient has been educated about risk factors including alcohol and tobacco abuse; they understand that avoidance of alcohol and tobacco is important to  prevent recurrences as well as other cancers. Has failed multiple cessation techniques. The patient continues to use tobacco. The patient was counseled to stop using tobacco and was offered pharmacotherapy and further counseling to help with this. The patient declined pharmacotherapy and further counseling at this time.  4) Swallowing: functional with effort  5) Thyroid function: on supplementation today: Lab Results  Component Value Date   TSH 8.168* 12/14/2014  Will increase does of Levothyroxine.  7) Social: continue f/u with Social Work prn.  Son getting psychological services. Pt has history of noncompliance, missed appts, etc.   8) Other: CT chest  NED as above. Refer pt to physical therapy for right arm range of motion.   I also advised the pt to follow up with his ENT.  9) Follow-up in 6 months W/ CT chest. Recheck TSH in 6 months. The patient was encouraged to call with any issues or questions before then.  This document serves as a record of services personally performed by [name], MD. It was created on her behalf by Darcus Austin, a trained  medical scribe. The creation of this record is based on the scribe's personal observations and the provider's statements to them. This document has been checked and approved by the attending provider.     _____________________________________   [name], MD

## 2014-12-15 NOTE — Progress Notes (Signed)
Pain Status: reports chronic pain in his lower back and knees   Vital Signs   Orthostatic Standing Vital Signs: 140/82, hr 97, standing 128/77, hr 105 :  Nutritional Status  a) intake: reports he can't eat meat and tomatos b) using a feeding tube?: no c) weight changes, if any: has gained 20 lbs since 06/22/14 Wt Readings from Last 3 Encounters:   06/22/14  171 lb 14.4 oz (77.973 kg)   06/09/14  173 lb 6.4 oz (78.654 kg)   02/25/14  158 lb 8.2 oz (71.9 kg)    Swallowing Status: Pt reports his throat feels more narrow.    Smoking or chewing tobacco? Smokes 1/2 ppd.  Dental (if applicable): When was last visit with dentistry: has dentures Using fluoride trays daily?   When was last ENT visit? n/a When is next ENT visit? Na/   Summary of last medical oncology visit (if applicable) is n/a. Next med/onc visit is on n/a.   Imaging done in the last month (if applicable) revealed: Ct of chest on 12/14/14 - printed.   Other notable issues, if any: Reports having a dry mouth, reports the front side of this tongue stings and has since surgery.  He is not able to eat any spicy foods.  Reports shortness of breath with activity.  He reports having a dry cough.  He denies hemoptysis.  He reports fatigue.  BP 140/82 mmHg  Pulse 97  Temp(Src) 97.9 F (36.6 C) (Oral)  Resp 12  Ht '5\' 8"'$  (1.727 m)  Wt 191 lb 4.8 oz (86.773 kg)  BMI 29.09 kg/m2  SpO2 100%

## 2014-12-18 ENCOUNTER — Telehealth: Payer: Self-pay | Admitting: *Deleted

## 2014-12-18 NOTE — Telephone Encounter (Signed)
CALLED PATIENT TO INFORM OF PT APPT., LAB, CT AND FU WITH DR. Isidore Moos, SPOKE WITH PATIENT AND HE IS AWARE OF THESE APPTS.

## 2014-12-26 ENCOUNTER — Ambulatory Visit: Payer: Self-pay | Attending: Radiation Oncology | Admitting: Physical Therapy

## 2014-12-26 ENCOUNTER — Ambulatory Visit: Payer: Self-pay | Admitting: Physical Therapy

## 2015-06-04 NOTE — Progress Notes (Deleted)
Error

## 2015-06-07 ENCOUNTER — Ambulatory Visit (HOSPITAL_COMMUNITY): Admission: RE | Admit: 2015-06-07 | Payer: Medicare Other | Source: Ambulatory Visit

## 2015-06-07 ENCOUNTER — Telehealth: Payer: Self-pay | Admitting: *Deleted

## 2015-06-07 ENCOUNTER — Ambulatory Visit: Payer: Self-pay

## 2015-06-07 ENCOUNTER — Ambulatory Visit: Admission: RE | Admit: 2015-06-07 | Payer: Medicare Other | Source: Ambulatory Visit

## 2015-06-07 NOTE — Telephone Encounter (Signed)
CALLED PATIENT TO ASK ABOUT SCAN, PATIENT'S WIFE REGINA SAID THAT THEY DIDN'T HAVE THE MONEY TO PAY FOR THE TESTS, SHE SAID THAT SHE IS TALKING TO SOMEONE WITH THE INSURANCE ABOUT PAYING FOR ANOTHER SCAN, NOTIFIED JENNIFER MALMFELT, [place]

## 2015-06-08 ENCOUNTER — Ambulatory Visit
Admission: RE | Admit: 2015-06-08 | Discharge: 2015-06-08 | Disposition: A | Discharge status: Home / Self Care | Payer: Self-pay | Source: Ambulatory Visit | Attending: Radiation Oncology | Admitting: Radiation Oncology

## 2015-06-27 ENCOUNTER — Telehealth: Payer: Self-pay

## 2015-06-27 ENCOUNTER — Other Ambulatory Visit: Payer: Self-pay | Admitting: Adult Health

## 2015-06-27 DIAGNOSIS — C023 Malignant neoplasm of anterior two-thirds of tongue, part unspecified: Secondary | ICD-10-CM

## 2015-06-27 DIAGNOSIS — R1310 Dysphagia, unspecified: Secondary | ICD-10-CM

## 2015-06-27 DIAGNOSIS — R05 Cough: Secondary | ICD-10-CM

## 2015-06-27 DIAGNOSIS — R5383 Other fatigue: Secondary | ICD-10-CM

## 2015-06-27 DIAGNOSIS — R059 Cough, unspecified: Secondary | ICD-10-CM

## 2015-06-27 NOTE — Telephone Encounter (Signed)
Ms. Barstow called me yesterday concerned about her husband. She stated he was not feeling well. He complains of feeling weak, a chronic cough and dysphagia. She would like him to be seen by Dr. Isidore Moos if possible. I explained that Dr. Isidore Moos was on vacation, but [name] NP was willing to see them. I explained that [facility] often works with patients that have received radiation to their head and neck area. Ms. Levit agreed to this however they are not available to come till Friday. Elzie Rings is able to see them on Friday, and has ordered a Chest X-ray and labs to be completed before the appointment. Ms. Londo is agreeable to this plan and will be here Friday.

## 2015-06-29 ENCOUNTER — Ambulatory Visit (HOSPITAL_BASED_OUTPATIENT_CLINIC_OR_DEPARTMENT_OTHER): Payer: Medicare Other | Admitting: Adult Health

## 2015-06-29 ENCOUNTER — Other Ambulatory Visit (HOSPITAL_BASED_OUTPATIENT_CLINIC_OR_DEPARTMENT_OTHER): Payer: Medicare Other

## 2015-06-29 ENCOUNTER — Encounter: Payer: Self-pay | Admitting: Adult Health

## 2015-06-29 ENCOUNTER — Ambulatory Visit (HOSPITAL_COMMUNITY)
Admission: RE | Admit: 2015-06-29 | Discharge: 2015-06-29 | Disposition: A | Discharge status: Home / Self Care | Payer: Medicare Other | Source: Ambulatory Visit | Attending: Adult Health | Admitting: Adult Health

## 2015-06-29 VITALS — BP 124/76 | HR 83 | Resp 16 | Wt 190.0 lb

## 2015-06-29 DIAGNOSIS — M542 Cervicalgia: Secondary | ICD-10-CM

## 2015-06-29 DIAGNOSIS — C3491 Malignant neoplasm of unspecified part of right bronchus or lung: Secondary | ICD-10-CM

## 2015-06-29 DIAGNOSIS — R059 Cough, unspecified: Secondary | ICD-10-CM

## 2015-06-29 DIAGNOSIS — R5383 Other fatigue: Secondary | ICD-10-CM

## 2015-06-29 DIAGNOSIS — C023 Malignant neoplasm of anterior two-thirds of tongue, part unspecified: Secondary | ICD-10-CM

## 2015-06-29 DIAGNOSIS — R0602 Shortness of breath: Secondary | ICD-10-CM | POA: Diagnosis not present

## 2015-06-29 DIAGNOSIS — R918 Other nonspecific abnormal finding of lung field: Secondary | ICD-10-CM | POA: Insufficient documentation

## 2015-06-29 DIAGNOSIS — R1310 Dysphagia, unspecified: Secondary | ICD-10-CM | POA: Diagnosis not present

## 2015-06-29 DIAGNOSIS — E038 Other specified hypothyroidism: Secondary | ICD-10-CM

## 2015-06-29 DIAGNOSIS — Z9889 Other specified postprocedural states: Secondary | ICD-10-CM | POA: Insufficient documentation

## 2015-06-29 DIAGNOSIS — R05 Cough: Secondary | ICD-10-CM | POA: Insufficient documentation

## 2015-06-29 LAB — CBC WITH DIFFERENTIAL/PLATELET
BASO%: 1 % (ref 0.0–2.0)
BASOS ABS: 0 10*3/uL (ref 0.0–0.1)
EOS%: 1.2 % (ref 0.0–7.0)
Eosinophils Absolute: 0.1 10*3/uL (ref 0.0–0.5)
HCT: 35.1 % — ABNORMAL LOW (ref 38.4–49.9)
HGB: 11 g/dL — ABNORMAL LOW (ref 13.0–17.1)
LYMPH%: 16.3 % (ref 14.0–49.0)
MCH: 24.2 pg — ABNORMAL LOW (ref 27.2–33.4)
MCHC: 31.2 g/dL — ABNORMAL LOW (ref 32.0–36.0)
MCV: 77.4 fL — AB (ref 79.3–98.0)
MONO#: 0.7 10*3/uL (ref 0.1–0.9)
MONO%: 14.8 % — AB (ref 0.0–14.0)
NEUT#: 3.2 10*3/uL (ref 1.5–6.5)
NEUT%: 66.7 % (ref 39.0–75.0)
Platelets: 246 10*3/uL (ref 140–400)
RBC: 4.54 10*6/uL (ref 4.20–5.82)
RDW: 19 % — ABNORMAL HIGH (ref 11.0–14.6)
WBC: 4.9 10*3/uL (ref 4.0–10.3)
lymph#: 0.8 10*3/uL — ABNORMAL LOW (ref 0.9–3.3)

## 2015-06-29 LAB — COMPREHENSIVE METABOLIC PANEL
ALBUMIN: 3.7 g/dL (ref 3.5–5.0)
ALK PHOS: 92 U/L (ref 40–150)
ALT: 12 U/L (ref 0–55)
AST: 15 U/L (ref 5–34)
Anion Gap: 7 mEq/L (ref 3–11)
BILIRUBIN TOTAL: 0.46 mg/dL (ref 0.20–1.20)
BUN: 12.3 mg/dL (ref 7.0–26.0)
CO2: 27 mEq/L (ref 22–29)
CREATININE: 0.9 mg/dL (ref 0.7–1.3)
Calcium: 9.2 mg/dL (ref 8.4–10.4)
Chloride: 100 mEq/L (ref 98–109)
EGFR: >90 mL/min/{1.73_m2} (ref >90)
Glucose: 90 mg/dl (ref 70–140)
Potassium: 4.3 mEq/L (ref 3.5–5.1)
SODIUM: 134 meq/L — AB (ref 136–145)
TOTAL PROTEIN: 7.7 g/dL (ref 6.4–8.3)

## 2015-06-29 LAB — TSH: TSH: 11.181 m(IU)/L — ABNORMAL HIGH (ref 0.320–4.118)

## 2015-06-29 MED ORDER — LEVOTHYROXINE SODIUM 125 MCG PO TABS: 125.0000 ug | ORAL_TABLET | Freq: Every day | ORAL | Status: DC

## 2015-06-29 MED ORDER — MELOXICAM 7.5 MG PO TABS: 7.5000 mg | ORAL_TABLET | Freq: Every day | ORAL | Status: DC

## 2015-06-29 MED FILL — LEVOTHYROXINE 125 MCG TAB: 125 | 90 days supply | Qty: 90 | Fill #0

## 2015-06-29 MED FILL — MELOXICAM 7.5 MG TABLET: 7.5 | 90 days supply | Qty: 90 | Fill #0

## 2015-06-29 NOTE — Patient Instructions (Signed)
-  Stop taking your current dose of Synthroid (Levothyroxine).  -Start taking Synthroid 125 mcg by mouth daily, first thing in the morning, on an empty stomach.  -I will have [name], our social worker, contact you when she can to help with any insurance/financial resources. -[name], RN-Nurse Navigator will be in touch with you both about the multi-disciplinary clinic [facility]) to meet with those extra specialists.  -The stomach doctor's office will be in touch about an appointment to see them about having your esophagus stretched.  -Return to the cancer center in about 6 weeks to see Dr. Isidore Moos, have repeat thyroid labs, and get a CT scan of your chest.   -Keep working on your efforts to cut down on your smoking and alcohol.   Call me with any questions or concerns!  I will be in touch with the results of your chest x-ray when I get them.   [name], NP [place] [phone]

## 2015-06-29 NOTE — Progress Notes (Signed)
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 Similar tiny RUL lung nodule since 2006, consistent with a benign etiology.    10/25/2012 - 12/10/2012 Radiation Therapy IM[facility] Isidore Moos). Right Tongue/ Bilateral Neck /// Total Dose, 60 Gy in 30 Fractions   03/03/2013 Imaging CT chest: Enlarging (and now cavitary) nodule in RLL measuring 1.3 cm (previous exam measured 4 mm). Suspicious for SCC metastasis.    02/2013 Procedure PEG removed    06/06/2013 Surgery Right video-assisted thoracoscopy, wedge resection, thoracoscopic right lower lobectomy, lymph node dissection. [facility])   07/13/2014 Imaging CT chest: s/p RUL lobectomy. No findings to suggest recurrent disease or new mets to thorax. Tiny 4 mm LUL nodule unchanged from 10/2012 and considered benign.     12/14/2014 Imaging CT chest: Stable postoperative chest CT. No evidence of local reurrence or metastatic disease. Stable postsurgical scarring in right lung.    06/29/2015 Imaging CXR:  Patient evaluated for acute concerns regarding fatigue, cough, and dysphagia.  CXR results pending.      INTERVAL HISTORY:  Trevor Griffin wife called our clinic earlier in the week to report her concerns regarding her husband's increased fatigue, cough, and dysphagia.  Patient was last seen by Dr. Isidore Moos in 12/2014 for routine follow-up with CT chest and TSH.  They present to clinic today for evaluation of these new complaints.    Trevor Griffin reports feeling very tired, "being cold all the time", having constipation, and depressed mood. He has been taking his Synthroid 75 mcg daily, but endorses that "sometimes I forget to take it."  He reports that his cough is chronic and has not really changed; he endorses dyspnea on exertion; denies dyspnea at rest.  He sees a cardiologist for his significant cardiac history; denies any recent chest pain.  With regards to his dysphagia, he reports that he is really only tolerating peanut butter & jelly sandwiches and noodles for most of his meals.  He feels like pills "get stuck in my throat."  He reports that sometimes food or liquid "goes down the wrong way and I get choked."  He has never been evaluated by a speech therapist.  He also reports bilateral upper neck/shoulder pain, with the right side worse than the left, and headaches almost daily.  Trevor Griffin continues to use tobacco (cigars) about 1/2 pack (10 cigars) per day. He also continues to drink alcohol, but states that he has "cut way back" and is now drinking 12 cans of beer per week; denies any liquor use.    Pain: Bilateral neck/shoulders, right worse than left; "muscle spasms" and headaches; "I  really need some pain medicine."   Nutritional Status:  -Intake/Diet: Soft foods -Using a feeding tube? No -Weight change: Loss ~1 lb since 12/2014  Swallowing:  Difficulty swallowing as above.   Last ENT visit: Dr. Ernesto Rutherford; "we haven't seen him lately because we owe him money."   Summary of last Rad Onc  visit:  12/15/14 Isidore Moos): CT chest negative. Increased Synthroid to 75 mcg. Encouraged cessation of tobacco/alcohol.  RTC in 6 months with CT chest.    Last TSH value: TSH 11.181 today ; TSH 8.168 in 12/2014  Last Imaging:  CT chest (12/14/14): IMPRESSION: 1. Stable postoperative chest CT. 2. No evidence of local recurrence or metastatic disease. 3. Stable postsurgical scarring in the right lung. 4. Atherosclerosis and gallstones again noted.    ADDITIONAL REVIEW OF SYSTEMS:  Review of Systems  Constitutional: Positive for malaise/fatigue. Negative for fever, chills and weight loss.       (+) fatigue  Respiratory: Positive for cough and shortness of breath.        (+) dyspnea on exertion; no SOB at rest   Cardiovascular: Negative for chest pain.  Gastrointestinal: Positive for constipation. Negative for nausea, vomiting and diarrhea.  Genitourinary: Negative for dysuria.  Musculoskeletal: Positive for myalgias and neck pain.       -bilat upper neck/shoulder pain; muscle tightness and muscle spasms. Right worse than left.   Neurological: Positive for headaches. Negative for dizziness.  Psychiatric/Behavioral: Positive for depression.     CURRENT MEDICATIONS:  Current Outpatient Prescriptions on File Prior to Visit  Medication Sig Dispense Refill  . acetaminophen (TYLENOL) 500 MG tablet Take 1,000 mg by mouth every 8 (eight) hours as needed (pain).    Marland Kitchen aspirin 81 MG tablet Take 1 tablet (81 mg total) by mouth daily. (Patient taking differently: Take 325 mg by mouth daily. ) 30 tablet 1  . atorvastatin (LIPITOR) 80 MG tablet Take 1 tablet (80 mg total) by mouth daily. 30 tablet 6  . buPROPion (WELLBUTRIN SR) 150 MG 12 hr tablet Take 1 tablet (150 mg total) by mouth 2 (two) times daily. 60 tablet 1  . metoprolol tartrate (LOPRESSOR) 25 MG tablet Take 0.5 tablets (12.5 mg total) by mouth 2 (two) times daily. 30 tablet 6  . Multiple Vitamins-Minerals (MULTIVITAMIN WITH MINERALS) tablet Take  1 tablet by mouth daily.      . nitroGLYCERIN (NITROSTAT) 0.4 MG SL tablet Place 1 tablet (0.4 mg total) under the tongue every 5 (five) minutes as needed (up to 3 doses). For chest pain (Patient not taking: Reported on 12/15/2014) 25 tablet 3   No current facility-administered medications on file prior to visit.    ALLERGIES:  Allergies  Allergen Reactions  . Hydromorphone Nausea Only and Other (See Comments)    Headaches also  . Nubain [Nalbuphine Hcl] Nausea Only and Other (See Comments)    Headaches also     PHYSICAL EXAM:  Filed Vitals:   06/29/15 1200 06/29/15 1205  BP: 134/77 124/76  Pulse: 74 83  Resp: 16   *Orthostatic VS as above.    Weight Date  190 lb (86.183 kg) 06/29/15  191 lb 4.8 oz (86.773 kg) 12/15/14  173 lb 6.4 oz (78.654 kg) 06/09/14  155 lb 3.2 oz (70.398 kg) 08/10/13  166 lb 4 oz (75.411 kg) Pre-treatment: 07/07/12   General: Chronically-ill appearing male in no acute distress.  Accompanied by his wife today.  HEENT: Head is atraumatic and normocephalic.  Pupils equal and reactive to light.  Conjunctivae clear without exudate.  Sclerae anicteric. Oral mucosa is pink and moist without lesions.  Tongue s/p resection; no masses to palpation; pink and moist. Oropharynx is pink and moist, without lesions. Pt declined removing his dentures for more thorough oral exam.  Lymph: No cervical, supraclavicular, or infraclavicular lymphadenopathy noted. (+) mild neck lymphedema.  Neck: No palpable masses. (+) neck fibrosis. Healed scars. Skin on neck is dry; no open skin lesions.  Cardiovascular: Normal rate and rhythm. Respiratory: Expiratory wheezes throughout. Diminished breath sounds to RLL. (pt declines use of inhalers because "I had a heart attack the last time I did a breathing treatment.") Chest expansion symmetric without accessory muscle use. Breathing non-labored.   GI: Abdomen soft and round. No tenderness to palpation. Bowel sounds normoactive.  GU: Deferred.     Neuro: No focal deficits. Steady gait. Musculoskeletal: Decreased ROM right shoulder. Right trapezius and sternocleidomastoid tight, (+) fibrosis.   Psych: Normal mood and affect for situation. Extremities: No edema, cyanosis, or clubbing.   Skin: Warm and dry.    LABORATORY DATA:  CBC Latest Ref Rng 06/29/2015 06/20/2014 02/24/2014  WBC 4.0 - 10.3 10e3/uL 4.9 4.2 3.7(L)  Hemoglobin 13.0 - 17.1 g/dL 11.0(L) 11.5(L) 10.9(L)  Hematocrit 38.4 - 49.9 % 35.1(L) 34.9(L) 33.9(L)  Platelets 140 - 400 10e3/uL 246 203 217   CMP Latest Ref Rng 06/29/2015 07/13/2014 06/21/2014  Glucose 70 - 140 mg/dl 90 - 92  BUN 7.0 - 26.0 mg/dL 12.3 11.1 7  Creatinine 0.7 - 1.3 mg/dL 0.9 0.8 0.96  Sodium 136 - 145 mEq/L 134(L) - 137  Potassium 3.5 - 5.1 mEq/L 4.3 - 4.1  Chloride 96 - 112 mmol/L - - 101  CO2 22 - 29 mEq/L 27 - 29  Calcium 8.4 - 10.4 mg/dL 9.2 - 9.3  Total Protein 6.4 - 8.3 g/dL 7.7 - -  Total Bilirubin 0.20 - 1.20 mg/dL 0.46 - -  Alkaline Phos 40 - 150 U/L 92 - -  AST 5 - 34 U/L 15 - -  ALT 0 - 55 U/L 12 - -    Ref. Range 07/13/2014 09:30 12/14/2014 11:00 06/29/2015 11:00  TSH Latest Ref Range: 0.320-4.118 m(IU)/L 5.620 (H) 8.168 (H) 11.181 (H)    DIAGNOSTIC IMAGING:  Chest x-ray (06/29/15):  IMPRESSION: There are chronic interstitial changes bilaterally which have improved. Postsurgical changes at the right lung base appear stable   ASSESSMENT & PLAN:  Mr.  Griffin is a pleasant 60 y.o. male with history of Stage III (T3N1M0), treated with surgery and radiation; completed treatment on 12/10/12.  In 02/2013, pt found to have RLL nodule, PET scan (+) for lung malignancy; treated with VATS/wedge resection/RLL lobectomy in 06/2013.  Patient presents to survivorship clinic today for evaluation of new concerns.   1. History of cancer of the tongue:  Clinically, he is without evidence of recurrence on physical exam today.  He should continue surveillance visits with Dr. Isidore Moos as previously  recommended.   2. History of Squamous cell carcinoma of right lung, metastatic tongue cancer vs. Primary bronchogenic cancer:  Trevor Griffin last CT chest in 12/2014 showed no evidence of disease.  Likely his chronic cough is related to his previous surgery/lobectomy.  Dr. Isidore Moos recommended f/u CT chest in 6 months from her previous visit, but patient with h/o compliance issues.  I have reinforced the importance of surveillance imaging for his history of cancer, particularly that he is continuing to smoke.  Chest x-ray today was reassuring without evidence of disease  recurrence or acute infiltrates. I have ordered CT chest with contrast in about 6 weeks with TSH and subsequent visit with Dr. Isidore Moos.   3. Hypothyroidism: TSH almost 3 times above the upper limit of normal today.  Trevor Griffin has been on 75 mcg since 12/2014.  He continues to have significant symptoms of hypothyroidism including fatigue, constipation, cold intolerance, and constipation.  Therefore, I have increased his Synthroid to 125 mcg today. I gave him specific instructions on proper use of Synthroid and the importance of not skipping doses.   4. Dysphagia/?Esophageal stricture:  Trevor Griffin weight is stable with only ~1 lb loss in past 6 months.  However, he reports dysphagia symptoms that are impacting his ability to eat certain foods and swallow pills without difficulty.  Trevor Griffin may have esophageal stricture as a result of treatment.  I have placed a GI referral for consideration for possible esophageal dilation.  I have also placed a referral to speech therapy through our multi-disciplinary clinic [facility]) with several specialists (speech, PT, nutrition, and social work). Trevor Griffin reports that he never saw speech therapy after surgery and I stressed the importance of therapy to help alleviate some of his dysphagia symptoms.   5. Neck pain/Muscle spasms/Decreased shoulder ROM: His neck/shoulder issues are likely secondary to  surgery/LN dissection and associated fibrosis. He reports that he did not get PT after initial surgery/treatment.  I have placed a referral to PT; again, [place] with several specialists may be the best option for him.  He states that he has tried Flexeril, muscle relaxer, in the past and it was ineffective. He is asking for opiates, like oxycodone and hydrocodone.  I explained my reasoning for not prescribing opiates for management of musculoskeletal pain. I have instead prescribed Meloxicam 7.5 mg po daily to provide anti-inflammatory effect.  I also encouraged the patient to pursue massage to help with breaking up the scar tissue.   6. Tobacco and alcohol cessation counseling: I educated Trevor Griffin and his wife that the biggest risk reduction behaviors he can do as a cancer survivor is to stop smoking and drinking. I discussed with him the increased risk of esophageal, colon, liver, bladder, and other cancers with tobacco and alcohol abuse.  He has tried Chantix and nicotrol inhalers in past without much success. Nicotrol worked better than the Chantix per patient report. I encouraged him to try to cut the use of the cigars and use nicotrol inhalers instead, then eventually wean himself off of the cigars all together and only use inhalers. I offered support for his cessation efforts when he is ready. Patient not ready to stop smoking.  Advised him we have options to help support him when he is ready.   7. Social stressors, mostly related to finances/insurance: I placed referral to social work; [place] an option as well.  Medical adherence to treatment recommendations continue to be a problem, with one of the biggest reported barriers being financial concerns.  I offered support today through active listening, validation of concerns, and support for adherence to treatment recommendations.     A total of 60 minutes was spent in face-to-face care of this patient, with greater than 50% of that time in counseling and  care-coordination.    [name], NP Survivorship Program [place] [phone]   (Coding/Billing notation: This patient is a no charge per Dr. Pablo Ledger, Medical Director of Survivorship; pt has never been seen by medical oncology)

## 2015-07-02 ENCOUNTER — Telehealth: Payer: Self-pay | Admitting: Adult Health

## 2015-07-02 NOTE — Telephone Encounter (Signed)
I reviewed the results of the patient's chest x-ray with the patient's wife. I let them know that there was no mention of any suspicious findings for cancer and no new acute infection.  I reviewed with her that the CT chest will be the better measure of cancer surveillance for his h/o lung cancer.  She voiced understanding.  They have not been contacted yet for any of the referrals/CT scan orders I placed at our visit, but I let her know that they would be in touch soon.  I encouraged she or Mr. Brizuela to call me with any other questions or concerns.  She voiced appreciation for my call.   [name], NP [place] [phone]

## 2015-07-25 ENCOUNTER — Encounter: Payer: Self-pay | Admitting: Gastroenterology

## 2015-07-25 ENCOUNTER — Telehealth: Payer: Self-pay | Admitting: *Deleted

## 2015-07-25 NOTE — Telephone Encounter (Signed)
Called patient to inform of GI appt. On 09-17-15, arrival time - 2:15 pm with Dr. Havery Moros, spoke with patient and he is aware of this appt.

## 2015-08-03 NOTE — Progress Notes (Signed)
Trevor Griffin presents for follow up of radiation completed 12/10/2012 to his Right Tonsil/Bilateral Neck.   Pain issues, if any: He reports chronic pain in his lower back which he rates a 8/10 Using a feeding tube?: No Weight changes, if any:  Wt Readings from Last 3 Encounters:  08/10/15 191 lb 3.2 oz (86.728 kg)  06/29/15 190 lb (86.183 kg)  12/15/14 191 lb 4.8 oz (86.773 kg)   Swallowing issues, if any: He is only able to eat soup and pastas, not any meats. He reports he chokes frequently with solid foods. He also needs to chew his food very well and have a drink with his meals. Smoking or chewing tobacco? He is smoking 1/2 pack a day per his report.  Using fluoride trays daily? He has dentures.  Last ENT visit was on: Dr. Macky Lower 2014? Other notable issues, if any: He is waiting to hear from a therapist to help with neck pain, spasms that [name] NP ordered. He also plans to have his esophagus stretched on May 15th to help with swallowing.  BP 118/78 mmHg  Pulse 85  Temp(Src) 97.9 F (36.6 C)  Ht '5\' 8"'$  (1.727 m)  Wt 191 lb 3.2 oz (86.728 kg)  BMI 29.08 kg/m2  SpO2 100%

## 2015-08-06 ENCOUNTER — Ambulatory Visit (HOSPITAL_COMMUNITY)
Admission: RE | Admit: 2015-08-06 | Discharge: 2015-08-06 | Disposition: A | Discharge status: Home / Self Care | Payer: Medicare Other | Source: Ambulatory Visit | Attending: Adult Health | Admitting: Adult Health

## 2015-08-06 ENCOUNTER — Ambulatory Visit
Admission: RE | Admit: 2015-08-06 | Discharge: 2015-08-06 | Disposition: A | Discharge status: Home / Self Care | Payer: Medicare Other | Source: Ambulatory Visit | Attending: Radiation Oncology | Admitting: Radiation Oncology

## 2015-08-06 DIAGNOSIS — C023 Malignant neoplasm of anterior two-thirds of tongue, part unspecified: Secondary | ICD-10-CM | POA: Diagnosis not present

## 2015-08-06 DIAGNOSIS — R911 Solitary pulmonary nodule: Secondary | ICD-10-CM | POA: Diagnosis not present

## 2015-08-06 DIAGNOSIS — Z902 Acquired absence of lung [part of]: Secondary | ICD-10-CM | POA: Diagnosis not present

## 2015-08-06 DIAGNOSIS — K8020 Calculus of gallbladder without cholecystitis without obstruction: Secondary | ICD-10-CM | POA: Insufficient documentation

## 2015-08-06 DIAGNOSIS — E039 Hypothyroidism, unspecified: Secondary | ICD-10-CM

## 2015-08-06 DIAGNOSIS — C3491 Malignant neoplasm of unspecified part of right bronchus or lung: Secondary | ICD-10-CM

## 2015-08-06 DIAGNOSIS — I2510 Atherosclerotic heart disease of native coronary artery without angina pectoris: Secondary | ICD-10-CM | POA: Diagnosis not present

## 2015-08-06 DIAGNOSIS — C029 Malignant neoplasm of tongue, unspecified: Secondary | ICD-10-CM | POA: Diagnosis not present

## 2015-08-06 LAB — BUN AND CREATININE (CC13)
BUN: 11.9 mg/dL (ref 7.0–26.0)
Creatinine: 0.9 mg/dL (ref 0.7–1.3)
EGFR: 88 mL/min/{1.73_m2} — ABNORMAL LOW (ref >90)

## 2015-08-06 LAB — TSH: TSH: 1.654 m(IU)/L (ref 0.320–4.118)

## 2015-08-06 MED ORDER — IOPAMIDOL (ISOVUE-300) INJECTION 61%
75.0000 mL | Freq: Once | INTRAVENOUS | Status: AC | PRN
Start: 2015-08-06 — End: 2015-08-06
  Administered 2015-08-06: 75 mL via INTRAVENOUS

## 2015-08-10 ENCOUNTER — Other Ambulatory Visit: Payer: Self-pay | Admitting: Adult Health

## 2015-08-10 ENCOUNTER — Encounter: Payer: Self-pay | Admitting: Radiation Oncology

## 2015-08-10 ENCOUNTER — Ambulatory Visit
Admission: RE | Admit: 2015-08-10 | Discharge: 2015-08-10 | Disposition: A | Discharge status: Home / Self Care | Payer: Medicare Other | Source: Ambulatory Visit | Attending: Radiation Oncology | Admitting: Radiation Oncology

## 2015-08-10 VITALS — BP 118/78 | HR 85 | Temp 97.9°F | Ht 68.0 in | Wt 191.2 lb

## 2015-08-10 DIAGNOSIS — C023 Malignant neoplasm of anterior two-thirds of tongue, part unspecified: Secondary | ICD-10-CM

## 2015-08-10 DIAGNOSIS — E039 Hypothyroidism, unspecified: Secondary | ICD-10-CM

## 2015-08-10 DIAGNOSIS — C3431 Malignant neoplasm of lower lobe, right bronchus or lung: Secondary | ICD-10-CM

## 2015-08-10 NOTE — Progress Notes (Addendum)
Radiation Oncology         [phone] ________________________________  Name: Trevor Griffin MRN: [id number]  Date: 08/10/2015  DOB: Feb 26, 1956  Follow-Up Visit Note  CC: Scarlette Calico, MD  Philomena Doheny, MD   ICD-9-CM ICD-10-CM   1. 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[place]) 141.4 C02.3     Diagnosis and Prior Radiotherapy:  Pathologic T3 N1 squamous cell carcinoma; right oral tongue; grade 2  Indication for treatment: Postoperative, curative  Radiation treatment dates: 10/25/2012-12/10/2012  Site/dose: Right Tongue/ Bilateral Neck /// Total Dose, 60 Gy in 30 Fractions   pT1b, pN0 Right lower lobe squamous cell carcinoma, s/p wedge resection on 06-06-13  Narrative:   Trevor Griffin presents for follow up of radiation completed 12/10/2012 to his Right Tonsil/Bilateral Neck. Pain issues, if any: He reports chronic pain in his lower back which he rates a 8/10 Using a feeding tube?: No Weight changes, if any:  Wt Readings from Last 3 Encounters:  08/10/15 191 lb 3.2 oz (86.728 kg)  06/29/15 190 lb (86.183 kg)  12/15/14 191 lb 4.8 oz (86.773 kg)   Swallowing issues, if any: He is only able to eat soup and pastas, not any meats. He reports he chokes frequently with solid foods. He also needs to chew his food very well and have a drink with his meals. Smoking or chewing tobacco? He is smoking 1/2 pack a day per his report.  Using fluoride trays daily? He has dentures.  Last ENT visit was on: Dr. Macky Lower 2014 - not compliant w/ visits Other notable issues, if any: He is waiting to hear from a therapist to help with neck pain, spasms that [name] NP ordered. He also plans to have his esophagus stretched on May 15th to help with swallowing.  -States he has an unsteady gait, his right side of his body feels weaker (chronic since surgery). He has headaches (new, severe). occasional nausea..  Continues to take levothyroxine everyday   BP 118/78 mmHg  Pulse 85  Temp(Src) 97.9  F (36.6 C)  Ht '5\' 8"'$  (1.727 m)  Wt 191 lb 3.2 oz (86.728 kg)  BMI 29.08 kg/m2  SpO2 100%   ALLERGIES:  is allergic to hydromorphone and nubain.  Meds: Current Outpatient Prescriptions  Medication Sig Dispense Refill  . acetaminophen (TYLENOL) 500 MG tablet Take 1,000 mg by mouth every 8 (eight) hours as needed (pain).    Marland Kitchen atorvastatin (LIPITOR) 80 MG tablet Take 1 tablet (80 mg total) by mouth daily. 30 tablet 6  . buPROPion (WELLBUTRIN SR) 150 MG 12 hr tablet Take 1 tablet (150 mg total) by mouth 2 (two) times daily. 60 tablet 1  . levothyroxine (SYNTHROID) 125 MCG tablet Take 1 tablet (125 mcg total) by mouth daily before breakfast. 90 tablet 0  . meloxicam (MOBIC) 7.5 MG tablet Take 1 tablet (7.5 mg total) by mouth daily. 90 tablet 0  . metoprolol tartrate (LOPRESSOR) 25 MG tablet Take 0.5 tablets (12.5 mg total) by mouth 2 (two) times daily. 30 tablet 6  . Multiple Vitamins-Minerals (MULTIVITAMIN WITH MINERALS) tablet Take 1 tablet by mouth daily.      . nitroGLYCERIN (NITROSTAT) 0.4 MG SL tablet Place 1 tablet (0.4 mg total) under the tongue every 5 (five) minutes as needed (up to 3 doses). For chest pain 25 tablet 3  . aspirin 81 MG tablet Take 1 tablet (81 mg total) by mouth daily. 30 tablet 1   No current facility-administered medications for this  encounter.    Physical Findings: The patient is in no acute distress. Patient is alert and oriented.  height is '5\' 8"'$  (1.727 m) and weight is 191 lb 3.2 oz (86.728 kg). His temperature is 97.9 F (36.6 C). His blood pressure is 118/78 and his pulse is 85. His oxygen saturation is 100%.    General: Alert and oriented, in no acute distress. Dentures removed for exam. HEENT: Head is normocephalic. Extraocular movements are intact. Oral mucosa is moist with no thrush or lesions. Post-surgical changes in tongue. No signs of recurrence. Neck: Neck is supple, no palpable cervical or supraclavicular lymphadenopathy. Telangiectasia over  skin of right lower neck. Anterior neck lymphedema and right trapezius tightness.Post treatment fibrosis over the right side of his neck.  Heart: Regular in rate and rhythm with no murmurs, rubs, or gallops. Chest: Clear to auscultation bilaterally, with no rhonchi, wheezes, or rales. Abdomen: Soft, nontender, nondistended, with no rigidity or guarding. Extremities: No cyanosis or edema. Well healed scar over his left forearm from his tissue graft.  Lymphatics: see Neck Exam Skin: No concerning lesions. Musculoskeletal: Upper extremity with symmetric strength and muscle tone throughout. Right leg with decreased motor strength on conformational testing Neurologic: He has very slight droop in the right mouth, other facial nerves are intact. Psychiatric: Judgment and insight are intact. Affect is appropriate.   Lab Findings: Lab Results  Component Value Date   WBC 4.9 06/29/2015   HGB 11.0* 06/29/2015   HCT 35.1* 06/29/2015   MCV 77.4* 06/29/2015   PLT 246 06/29/2015   CMP     Component Value Date/Time   NA 134* 06/29/2015 1100   NA 137 06/21/2014 0814   K 4.3 06/29/2015 1100   K 4.1 06/21/2014 0814   CL 101 06/21/2014 0814   CO2 27 06/29/2015 1100   CO2 29 06/21/2014 0814   GLUCOSE 90 06/29/2015 1100   GLUCOSE 92 06/21/2014 0814   BUN 11.9 08/06/2015 1009   BUN 7 06/21/2014 0814   CREATININE 0.9 08/06/2015 1009   CREATININE 0.96 06/21/2014 0814   CREATININE 0.86 01/21/2012 1418   CALCIUM 9.2 06/29/2015 1100   CALCIUM 9.3 06/21/2014 0814   PROT 7.7 06/29/2015 1100   PROT 5.8* 06/08/2013 0410   ALBUMIN 3.7 06/29/2015 1100   ALBUMIN 2.6* 06/08/2013 0410   AST 15 06/29/2015 1100   AST 26 06/08/2013 0410   ALT 12 06/29/2015 1100   ALT 20 06/08/2013 0410   ALKPHOS 92 06/29/2015 1100   ALKPHOS 75 06/08/2013 0410   BILITOT 0.46 06/29/2015 1100   BILITOT 0.6 06/08/2013 0410   GFRNONAA 90* 06/21/2014 0814   GFRAA >90 06/21/2014 0814     Lab Results  Component Value Date     TSH 1.654 08/06/2015    Radiographic Findings:     08/06/15 Chest CT: CLINICAL DATA: Anterior tongue squamous cell carcinoma status post surgery and radiation treatment completed 2014. Squamous cell carcinoma in the right lower lobe of the lung status post wedge resection in 2015. Patient presents for restaging.  EXAM: CT CHEST WITH CONTRAST  TECHNIQUE: Multidetector CT imaging of the chest was performed during intravenous contrast administration.  CONTRAST: 10m ISOVUE-300 IOPAMIDOL (ISOVUE-300) INJECTION 61%  COMPARISON: 03/2015 chest CT. 06/29/2015 chest radiograph.  FINDINGS: Mediastinum/Nodes: Normal heart size. Stable trace pericardial fluid/ thickening. Left anterior descending and left circumflex coronary atherosclerosis. Great vessels are normal in course and caliber. No central pulmonary emboli. Normal visualized thyroid. Normal esophagus. No pathologically enlarged axillary, mediastinal or hilar  lymph nodes.  Lungs/Pleura: No pneumothorax. No pleural effusion. Status post right lower lobectomy. Stable mild scarring in the peripheral and posterior right upper lobe. Peripheral right upper lobe 4 mm solid pulmonary nodule (series 5/ image 50) is stable since 11/15/2013. Left upper lobe 3 mm pulmonary nodule is stable since 03/03/2013 and benign. No acute consolidative airspace disease or new significant pulmonary nodules.  Upper abdomen: Cholelithiasis. Suggestion of diffuse hepatic steatosis.  Musculoskeletal: No aggressive appearing focal osseous lesions. Mild degenerative changes in the thoracic spine.  IMPRESSION: 1. Status post right lower lobectomy. No evidence of locally recurrent or new metastatic disease in the chest. 2. Right upper lobe 4 mm pulmonary nodule, for which 20 month stability has been demonstrated, probably benign. 3. Additional findings include 2 vessel coronary atherosclerosis, cholelithiasis and probable diffuse hepatic  steatosis.   Electronically Signed  By: Ilona Sorrel M.D.  On: 08/06/2015 10:02  Impression/Plan:    1) Head and Neck Cancer Status: NED 08/06/15 CT chest scan and PE show no evidence for recurrence. However, the patient is experiencing new HAs, and unsteady gait. Chronic R sided weakness since surgery  2) Nutritional Status: no active issues  3) Risk Factors: The patient has been educated about risk factors including alcohol and tobacco abuse; they understand that avoidance of alcohol and tobacco is important to prevent recurrences as well as other cancers. Has failed multiple cessation techniques. The patient continues to use tobacco. The patient was counseled to stop using tobacco.  4) Swallowing: functional    5) Thyroid function: on supplementation today: WNL Lab Results  Component Value Date   TSH 1.654 08/06/2015    7) Social: Patient has history of noncompliance, missed appts, etc.   8) Other: I will make another order for the patient to meet with a therapist regarding his neck spasms. In light of his complaints of headaches and right leg weakness causing unsteady gait, I will order for a head CT. I will call him with the results.  9) Follow up with [name] NP in 6 months with repeat TSH  Annual CT chest lung cancer screening. I will see him back PRN; I wished him the best.  _____________________________________   [name], MD   This document serves as a record of services personally performed by [name], MD. It was created on her behalf by Derek Mound, a trained medical scribe. The creation of this record is based on the scribe's personal observations and the provider's statements to them. This document has been checked and approved by the attending provider.

## 2015-08-13 ENCOUNTER — Telehealth: Payer: Self-pay

## 2015-08-13 ENCOUNTER — Encounter (HOSPITAL_COMMUNITY): Payer: Self-pay

## 2015-08-13 ENCOUNTER — Ambulatory Visit (HOSPITAL_COMMUNITY)
Admission: RE | Admit: 2015-08-13 | Discharge: 2015-08-13 | Disposition: A | Discharge status: Home / Self Care | Payer: Medicare Other | Source: Ambulatory Visit | Attending: Radiation Oncology | Admitting: Radiation Oncology

## 2015-08-13 DIAGNOSIS — G319 Degenerative disease of nervous system, unspecified: Secondary | ICD-10-CM | POA: Diagnosis not present

## 2015-08-13 DIAGNOSIS — C023 Malignant neoplasm of anterior two-thirds of tongue, part unspecified: Secondary | ICD-10-CM

## 2015-08-13 DIAGNOSIS — R51 Headache: Secondary | ICD-10-CM | POA: Diagnosis not present

## 2015-08-13 DIAGNOSIS — C3431 Malignant neoplasm of lower lobe, right bronchus or lung: Secondary | ICD-10-CM

## 2015-08-13 HISTORY — DX: Malignant neoplasm of unspecified part of unspecified bronchus or lung: C34.90

## 2015-08-13 MED ORDER — IOPAMIDOL (ISOVUE-300) INJECTION 61%
75.0000 mL | Freq: Once | INTRAVENOUS | Status: AC | PRN
  Administered 2015-08-13: 75 mL via INTRAVENOUS

## 2015-08-13 NOTE — Telephone Encounter (Signed)
I called Mr. Coaxum to inform him that his Head CT results didn't reveal any sign of cancer, nor any explanation for the headaches or other symptoms he described. Mr. Aughenbaugh voiced his appreciation for the phone call, and knows to call if he has any further questions.

## 2015-08-16 ENCOUNTER — Ambulatory Visit: Payer: Medicare Other | Admitting: Physical Therapy

## 2015-08-28 ENCOUNTER — Ambulatory Visit: Payer: Medicare Other | Admitting: Physical Therapy

## 2015-09-13 ENCOUNTER — Ambulatory Visit: Payer: Medicare Other | Admitting: Physical Therapy

## 2015-09-17 ENCOUNTER — Ambulatory Visit: Payer: Medicare Other | Admitting: Gastroenterology

## 2015-09-27 ENCOUNTER — Ambulatory Visit: Payer: Medicare Other | Attending: Radiation Oncology | Admitting: Physical Therapy

## 2015-10-08 ENCOUNTER — Ambulatory Visit: Payer: Medicare Other | Attending: Radiation Oncology | Admitting: Physical Therapy

## 2015-10-08 ENCOUNTER — Telehealth: Payer: Self-pay | Admitting: Physical Therapy

## 2015-10-08 NOTE — Telephone Encounter (Signed)
Patient has either no showed or canceled for 6 separate evaluation appointments for physical therapy.  We have contacted him after several of these and there have been various reasons for canceling or no showing.  However, at this time we are unable to continue rescheduling him due to non-compliance.  Thanks - Inez Catalina

## 2015-11-28 ENCOUNTER — Encounter (HOSPITAL_COMMUNITY): Payer: Self-pay

## 2015-11-28 ENCOUNTER — Emergency Department (HOSPITAL_COMMUNITY)
Admission: EM | Admit: 2015-11-28 | Discharge: 2015-11-28 | Disposition: A | Discharge status: Home / Self Care | Payer: Medicare Other | Attending: Emergency Medicine | Admitting: Emergency Medicine

## 2015-11-28 ENCOUNTER — Emergency Department (HOSPITAL_COMMUNITY): Payer: Medicare Other

## 2015-11-28 DIAGNOSIS — R0602 Shortness of breath: Secondary | ICD-10-CM | POA: Diagnosis not present

## 2015-11-28 DIAGNOSIS — R0789 Other chest pain: Secondary | ICD-10-CM | POA: Insufficient documentation

## 2015-11-28 DIAGNOSIS — Z85810 Personal history of malignant neoplasm of tongue: Secondary | ICD-10-CM | POA: Insufficient documentation

## 2015-11-28 DIAGNOSIS — J449 Chronic obstructive pulmonary disease, unspecified: Secondary | ICD-10-CM | POA: Diagnosis not present

## 2015-11-28 DIAGNOSIS — I10 Essential (primary) hypertension: Secondary | ICD-10-CM | POA: Diagnosis not present

## 2015-11-28 DIAGNOSIS — I2510 Atherosclerotic heart disease of native coronary artery without angina pectoris: Secondary | ICD-10-CM | POA: Diagnosis not present

## 2015-11-28 DIAGNOSIS — F17210 Nicotine dependence, cigarettes, uncomplicated: Secondary | ICD-10-CM | POA: Diagnosis not present

## 2015-11-28 DIAGNOSIS — Z85118 Personal history of other malignant neoplasm of bronchus and lung: Secondary | ICD-10-CM | POA: Insufficient documentation

## 2015-11-28 DIAGNOSIS — R079 Chest pain, unspecified: Secondary | ICD-10-CM

## 2015-11-28 DIAGNOSIS — Z955 Presence of coronary angioplasty implant and graft: Secondary | ICD-10-CM | POA: Insufficient documentation

## 2015-11-28 DIAGNOSIS — Z7982 Long term (current) use of aspirin: Secondary | ICD-10-CM | POA: Diagnosis not present

## 2015-11-28 LAB — CBC
HEMATOCRIT: 34.4 % — AB (ref 39.0–52.0)
HEMOGLOBIN: 10.7 g/dL — AB (ref 13.0–17.0)
MCH: 24.9 pg — ABNORMAL LOW (ref 26.0–34.0)
MCHC: 31.1 g/dL (ref 30.0–36.0)
MCV: 80 fL (ref 78.0–100.0)
Platelets: 184 10*3/uL (ref 150–400)
RBC: 4.3 MIL/uL (ref 4.22–5.81)
RDW: 18 % — AB (ref 11.5–15.5)
WBC: 3.6 10*3/uL — AB (ref 4.0–10.5)

## 2015-11-28 LAB — BASIC METABOLIC PANEL
ANION GAP: 9 (ref 5–15)
BUN: 6 mg/dL (ref 6–20)
CHLORIDE: 101 mmol/L (ref 101–111)
CO2: 26 mmol/L (ref 22–32)
Calcium: 8.9 mg/dL (ref 8.9–10.3)
Creatinine, Ser: 0.89 mg/dL (ref 0.61–1.24)
GFR calc Af Amer: >60 mL/min (ref >60)
GFR calc non Af Amer: >60 mL/min (ref >60)
GLUCOSE: 93 mg/dL (ref 65–99)
POTASSIUM: 4.4 mmol/L (ref 3.5–5.1)
SODIUM: 136 mmol/L (ref 135–145)

## 2015-11-28 LAB — I-STAT TROPONIN, ED
TROPONIN I, POC: 0 ng/mL (ref 0.00–0.08)
Troponin i, poc: 0.01 ng/mL (ref 0.00–0.08)

## 2015-11-28 MED ORDER — HYDROCODONE-ACETAMINOPHEN 5-325 MG PO TABS: 1.0000 | ORAL_TABLET | ORAL | 0 refills | Status: DC | PRN

## 2015-11-28 MED ORDER — HYDROCODONE-ACETAMINOPHEN 5-325 MG PO TABS
2.0000 | ORAL_TABLET | Freq: Once | ORAL | Status: AC
Start: 2015-11-28 — End: 2015-11-28
  Administered 2015-11-28: 2 via ORAL
  Filled 2015-11-28: qty 2

## 2015-11-28 MED ORDER — PANTOPRAZOLE SODIUM 40 MG PO TBEC
40.0000 mg | DELAYED_RELEASE_TABLET | Freq: Once | ORAL | Status: AC
  Administered 2015-11-28: 40 mg via ORAL
  Filled 2015-11-28: qty 1

## 2015-11-28 MED ORDER — OMEPRAZOLE 20 MG PO CPDR: 20.0000 mg | DELAYED_RELEASE_CAPSULE | Freq: Every day | ORAL | 0 refills | Status: DC

## 2015-11-28 MED ORDER — MORPHINE SULFATE (PF) 4 MG/ML IV SOLN
4.0000 mg | Freq: Once | INTRAVENOUS | Status: AC
  Administered 2015-11-28: 4 mg via INTRAVENOUS
  Filled 2015-11-28: qty 1

## 2015-11-28 NOTE — ED Notes (Signed)
Urinal handed to patient to use

## 2015-11-28 NOTE — ED Notes (Signed)
MD at bedside updating pt

## 2015-11-28 NOTE — ED Provider Notes (Signed)
[facility] DEPT Provider Note   CSN: [id number] Arrival date & time: 11/28/15  [id number]  First Provider Contact:  None       History   Chief Complaint Chief Complaint  Patient presents with  . Chest Pain    HPI Trevor Griffin is a 60 y.o. male.  HPI Patient reports he's had left-sided chest pain since midday today. Been constant. It does wax and wane in severity. It has a pressure and tight sensation. Somewhat worse with deep inspiration. Patient reports he chronically has a cough. He has not had recent fever. No sputum production. He reports he has had somewhat similar pain the last time he had a heart attack. He has not had sweating or nausea. No radiation of pain. He also reports he has a history of oral cancer. He states for several weeks he has not felt well. He's been generally fatigued. Past Medical History:  Diagnosis Date  . Alcohol abuse   . Anxiety   . CAD (coronary artery disease)    a. Ant STEMI 5/08 with 4 Taxus DES proximal to mid LAD;  b. 06/2007:  Stopped Plavix->anterior STEMI/stent thrombosis in LAD (PTCA), LCX 6md;  c. 10/12: Cath/PCI: prox to mid LAD stent ok with 10-20% ISR, mLAD 40%, CFX 30-40%, mCFX 90% (DES), EF 35%;  d. 02/2014 MV: antsept infarct w/ mild peri-infarct ischemia->med rx. e. cath 06/21/2014 no significant CAD, patent stents, EF 40-45%  . Chronic pain   . COPD (chronic obstructive pulmonary disease) ([facility]   . Depression   . Fatigue    sleep test neg 2008  . GERD (gastroesophageal reflux disease)   . Headache(784.0)   . Hx of radiation therapy 10/25/12-12/10/12    Right Tongue/ Bilateral Neck //Total Dose, 60 Gy in 30 Fractions  . Hyperlipidemia   . Hypertension   . Ischemic cardiomyopathy    a. Echo 08/20/11 with EF 50-55% (previously 35%);  b. 02/2014 Echo: EF 45-50-%, mid-apicalanteroseptal AK, Gr 1 dd, mild AI, midlly dil LA.  . Lung cancer ([facility] [place]  . Obesity   . Palpitations    For event monitor 01/2012  . Sleep apnea    not  on CPAP  . Tobacco abuse   . Tongue cancer ([facility] dx'd 2014    Patient Active Problem List   Diagnosis Date Noted  . Angina pectoris ([facility] 06/21/2014  . Midsternal chest pain 02/25/2014  . Chest pain 02/23/2014  . Right lower lobe lung mass 06/06/2013  . Nodule of right lung 04/04/2013  . 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[facility] 10/13/2012  . Tracheostomy stoma dilation ([facility] 09/27/2012  . Hemoptysis 09/27/2012  . Hemorrhage from tracheostomy stoma ([facility] 09/27/2012  . Lesion of tongue 07/07/2012  . HTN (hypertension) 08/25/2011  . Hip pain, bilateral 03/26/2011  . Low back pain 03/26/2011  . Anxiety 03/26/2011  . Alcohol abuse 02/26/2011  . Cardiomyopathy, ischemic-EF 50% by 2D Oct 2015 11/06/2009  . Dyslipidemia 09/26/2009  . SMOKER 09/26/2009  . CAD S/P multiple PCIs 09/26/2009    Past Surgical History:  Procedure Laterality Date  . CARDIAC CATHETERIZATION     ANTERIOR STEMI W/4  OVERLAPPING TAXUS STENTS IN THE PROXIMAL TO MID LAD  . CORONARY ANGIOPLASTY    . FRACTURE SURGERY Right    as a child,leg  . LEFT HEART CATHETERIZATION WITH CORONARY ANGIOGRAM N/A 06/21/2014   Procedure: LEFT HEART CATHETERIZATION WITH CORONARY ANGIOGRAM;  Surgeon: CBurnell Blanks MD;  [facility];  Service: Cardiovascular;  Laterality: N/A;  . LOBECTOMY Right 06/06/2013   Procedure: LOBECTOMY;  Surgeon: Melrose Nakayama, MD;  Location: [facility];  Service: Thoracic;  Laterality: Right;  Marland Kitchen VIDEO ASSISTED THORACOSCOPY (VATS)/WEDGE RESECTION Right 06/06/2013   Procedure: VIDEO ASSISTED THORACOSCOPY (VATS)/WEDGE RESECTION;  Surgeon: Melrose Nakayama, MD;  Location: [facility];  Service: Thoracic;  Laterality: Right;       Home Medications    Prior to Admission medications   Medication Sig Start Date End Date Taking? Authorizing Provider  acetaminophen (TYLENOL) 500 MG tablet Take 1,000 mg by mouth every 8 (eight) hours as needed (pain).   Yes Historical Provider, MD    aspirin 81 MG tablet Take 1 tablet (81 mg total) by mouth daily. 02/25/14  Yes Rogelia Mire, NP  atorvastatin (LIPITOR) 80 MG tablet Take 1 tablet (80 mg total) by mouth daily. 02/25/14  Yes Rogelia Mire, NP  buPROPion [facility] SR) 150 MG 12 hr tablet Take 1 tablet (150 mg total) by mouth 2 (two) times daily. 02/25/14  Yes Rogelia Mire, NP  levothyroxine (SYNTHROID) 125 MCG tablet Take 1 tablet (125 mcg total) by mouth daily before breakfast. 06/29/15  Yes Holley Bouche, NP  meloxicam (MOBIC) 7.5 MG tablet Take 1 tablet (7.5 mg total) by mouth daily. 06/29/15  Yes Holley Bouche, NP  metoprolol tartrate (LOPRESSOR) 25 MG tablet Take 0.5 tablets (12.5 mg total) by mouth 2 (two) times daily. 02/25/14  Yes Rogelia Mire, NP  Multiple Vitamins-Minerals (MULTIVITAMIN WITH MINERALS) tablet Take 1 tablet by mouth daily.     Yes Historical Provider, MD  nitroGLYCERIN (NITROSTAT) 0.4 MG SL tablet Place 1 tablet (0.4 mg total) under the tongue every 5 (five) minutes as needed (up to 3 doses). For chest pain 02/25/14  Yes Rogelia Mire, NP  traMADol (ULTRAM) 50 MG tablet Take 50 mg by mouth every 6 (six) hours as needed. For pain   Yes Historical Provider, MD  HYDROcodone-acetaminophen (NORCO/VICODIN) 5-325 MG tablet Take 1-2 tablets by mouth every 4 (four) hours as needed for moderate pain or severe pain. 11/28/15   Charlesetta Shanks, MD  omeprazole (PRILOSEC) 20 MG capsule Take 1 capsule (20 mg total) by mouth daily. 11/28/15   Charlesetta Shanks, MD    Family History Family History  Problem Relation Age of Onset  . Kidney disease Mother     CHRONIC  . Cancer Sister   . Liver disease Sister     LIVER FAILURE    Social History Social History  Substance Use Topics  . Smoking status: Current Every Day Smoker    Packs/day: 0.50    Years: 40.00    Types: Cigars  . Smokeless tobacco: Never Used     Comment: Stopped smoking cigars day before surgery, 06/09/14 smoking  1/2 pack cigars daily, 06/29/15-continues to smoke 1/2 ppd of cigars  . Alcohol use 7.2 oz/week    12 Cans of beer per week     Comment: 8-12 BEERS DAILY prior to surgery.  12 beers per week at present (06/29/15)     Allergies   Hydromorphone and Nubain [nalbuphine hcl]   Review of Systems Review of Systems 10 Systems reviewed and are negative for acute change except as noted in the HPI.  Physical Exam Updated Vital Signs BP 123/77   Pulse 92   Temp 97.4 F (36.3 C) (Oral)   Resp 18   Ht '5\' 8"'$  (1.727 m)   Wt 185 lb (83.9 kg)  SpO2 98%   BMI 28.13 kg/m   Physical Exam  Constitutional:  Patient is deconditioned with central obesity. He is alert and nontoxic. No respiratory distress at rest.  HENT:  Head: Normocephalic and atraumatic.  Right-sided tongue has been surgically removed. Posterior oropharynx is widely patent. Oral surgical changes present.  Eyes: EOM are normal. Pupils are equal, round, and reactive to light.  Cardiovascular: Normal rate, regular rhythm, normal heart sounds and intact distal pulses.   Pulmonary/Chest: Effort normal.  The patient has scattered expiratory wheeze and soft breath sounds at the bases. No gross rhonchi or rail.  Abdominal: Soft. Bowel sounds are normal. He exhibits no distension. There is no tenderness. There is no guarding.  Musculoskeletal: Normal range of motion. He exhibits no edema or tenderness.  Neurological: He is alert. No cranial nerve deficit. He exhibits normal muscle tone. Coordination normal.  Skin: Skin is warm and dry.  Psychiatric: He has a normal mood and affect.     ED Treatments / Results  Labs (all labs ordered are listed, but only abnormal results are displayed) Labs Reviewed  CBC - Abnormal; Notable for the following:       Result Value   WBC 3.6 (*)    Hemoglobin 10.7 (*)    HCT 34.4 (*)    MCH 24.9 (*)    RDW 18.0 (*)    All other components within normal limits  BASIC METABOLIC PANEL  Randolm Idol, ED  Randolm Idol, ED    EKG  EKG Interpretation  Date/Time:  Wednesday November 28 2015 18:42:10 EDT Ventricular Rate:  97 PR Interval:    QRS Duration: 92 QT Interval:  337 QTC Calculation: 428 R Axis:   25 Text Interpretation:  Sinus rhythm Anteroseptal infarct, old no change from previous Confirmed by Johnney Killian, MD, Jeannie Done [phone]) on 11/28/2015 8:01:53 PM       Radiology Dg Chest 2 View  Result Date: 11/28/2015 CLINICAL DATA:  Left-sided chest pain and shortness of breath beginning today. EXAM: CHEST  2 VIEW COMPARISON:  Two-view chest x-ray 06/29/2015. CT of the chest 08/06/2015. FINDINGS: Heart size is normal. Right lower lobectomy is again noted. There is chronic volume loss and blunting of the CP angle. No edema or effusion is present. No focal airspace disease is evident. The visualized soft tissues and bony thorax are unremarkable. IMPRESSION: No acute cardiopulmonary disease or significant interval change. Electronically Signed   By: San Morelle M.D.   On: 11/28/2015 21:10   Procedures Procedures (including critical care time)  Medications Ordered in ED Medications  morphine 4 MG/ML injection 4 mg (4 mg Intravenous Given 11/28/15 2133)  pantoprazole (PROTONIX) EC tablet 40 mg (40 mg Oral Given 11/28/15 2328)  HYDROcodone-acetaminophen (NORCO/VICODIN) 5-325 MG per tablet 2 tablet (2 tablets Oral Given 11/28/15 2328)     Initial Impression / Assessment and Plan / ED Course  I have reviewed the triage vital signs and the nursing notes.  Pertinent labs & imaging results that were available during my care of the patient were reviewed by me and considered in my medical decision making (see chart for details).  Clinical Course      Final Clinical Impressions(s) / ED Diagnoses   Final diagnoses:  Nonspecific chest pain  Patient has had constant chest pain. He does report chronic cough but no fevers or chills. Chest x-ray does not show focal infiltrate  and there is no leukocytosis. At this time I doubt pneumonia. Patient does not have lower  extremity edema or calf tenderness or tachycardia or hypoxia or prior history of PE. At this time I feel that PE is lower probability. There is reproducible component to the patient's chest pain and the positional component. I more highly suspect musculoskeletal or GI related pain. Patient reports she does have reflux problems and sporadically takes Prilosec over-the-counter. At this time, I advised daily Protonix for 2 weeks. Patient will be given Vicodin to take as needed for pain. He is advised to follow-up with a family physician. Resources are provided. Patient is counseled to return to the emergency department should his symptoms worsen change or other concerns develop.  New Prescriptions Discharge Medication List as of 11/28/2015 11:40 PM    START taking these medications   Details  HYDROcodone-acetaminophen (NORCO/VICODIN) 5-325 MG tablet Take 1-2 tablets by mouth every 4 (four) hours as needed for moderate pain or severe pain., Starting Wed 11/28/2015, Print    omeprazole (PRILOSEC) 20 MG capsule Take 1 capsule (20 mg total) by mouth daily., Starting Wed 11/28/2015, Print         Charlesetta Shanks, MD 11/28/15 [phone]

## 2015-11-28 NOTE — ED Notes (Signed)
Patient undressed, in gown, on monitor, continuous pulse oximetry and blood pressure cuff 

## 2015-11-28 NOTE — ED Triage Notes (Signed)
Pt presents to ed with complaints of chest pain on the left side of his chest that started this afternoon, the pain feels like a stabbing pain, the patient has had previous MI and this does not feel the same, patient received 324 of asa en route

## 2015-11-28 NOTE — ED Notes (Signed)
Patient able to ambulate independently  

## 2015-11-28 NOTE — ED Notes (Signed)
EKG was performed by Loma Sousa, RN

## 2015-12-20 ENCOUNTER — Encounter (HOSPITAL_COMMUNITY): Payer: Self-pay | Admitting: Family Medicine

## 2015-12-20 ENCOUNTER — Ambulatory Visit (HOSPITAL_COMMUNITY)
Admission: EM | Admit: 2015-12-20 | Discharge: 2015-12-20 | Disposition: A | Discharge status: Home / Self Care | Payer: Medicare Other | Attending: Family Medicine | Admitting: Family Medicine

## 2015-12-20 DIAGNOSIS — M25522 Pain in left elbow: Secondary | ICD-10-CM | POA: Diagnosis present

## 2015-12-20 DIAGNOSIS — Z885 Allergy status to narcotic agent status: Secondary | ICD-10-CM | POA: Diagnosis not present

## 2015-12-20 DIAGNOSIS — F17290 Nicotine dependence, other tobacco product, uncomplicated: Secondary | ICD-10-CM | POA: Diagnosis not present

## 2015-12-20 DIAGNOSIS — Z8614 Personal history of Methicillin resistant Staphylococcus aureus infection: Secondary | ICD-10-CM | POA: Diagnosis not present

## 2015-12-20 DIAGNOSIS — Z23 Encounter for immunization: Secondary | ICD-10-CM | POA: Diagnosis not present

## 2015-12-20 DIAGNOSIS — L03114 Cellulitis of left upper limb: Secondary | ICD-10-CM | POA: Diagnosis not present

## 2015-12-20 MED ORDER — DOXYCYCLINE HYCLATE 100 MG PO TABS: 100.0000 mg | ORAL_TABLET | Freq: Two times a day (BID) | ORAL | 0 refills | Status: DC

## 2015-12-20 MED ORDER — TETANUS-DIPHTH-ACELL PERTUSSIS 5-2.5-18.5 LF-MCG/0.5 IM SUSP
0.5000 mL | Freq: Once | INTRAMUSCULAR | Status: AC
  Administered 2015-12-20: 0.5 mL via INTRAMUSCULAR

## 2015-12-20 MED FILL — DOXYCYCLINE HYCLATE 100 MG: 100 | 21 days supply | Qty: 42 | Fill #0

## 2015-12-20 NOTE — ED Provider Notes (Signed)
[place]    CSN: [id number] Arrival date & time: 12/20/15  1039  First Provider Contact:  None       History   Chief Complaint Chief Complaint  Patient presents with  . Abscess    HPI Trevor Griffin is a 60 y.o. male.   HPI This 60 year old gentleman has had pain and swelling in his left elbow for 2 months. Just recently began draining purulent material. He continues to be sore although he does have full range of motion. He has a history of MRSA.  He is currently being treated at the cancer center  Patient denies allergies to medications. Past Medical History:  Diagnosis Date  . Alcohol abuse   . Anxiety   . CAD (coronary artery disease)    a. Ant STEMI 5/08 with 4 Taxus DES proximal to mid LAD;  b. 06/2007:  Stopped Plavix->anterior STEMI/stent thrombosis in LAD (PTCA), LCX 75md;  c. 10/12: Cath/PCI: prox to mid LAD stent ok with 10-20% ISR, mLAD 40%, CFX 30-40%, mCFX 90% (DES), EF 35%;  d. 02/2014 MV: antsept infarct w/ mild peri-infarct ischemia->med rx. e. cath 06/21/2014 no significant CAD, patent stents, EF 40-45%  . Chronic pain   . COPD (chronic obstructive pulmonary disease) ([place]   . Depression   . Fatigue    sleep test neg 2008  . GERD (gastroesophageal reflux disease)   . Headache(784.0)   . Hx of radiation therapy 10/25/12-12/10/12    Right Tongue/ Bilateral Neck //Total Dose, 60 Gy in 30 Fractions  . Hyperlipidemia   . Hypertension   . Ischemic cardiomyopathy    a. Echo 08/20/11 with EF 50-55% (previously 35%);  b. 02/2014 Echo: EF 45-50-%, mid-apicalanteroseptal AK, Gr 1 dd, mild AI, midlly dil LA.  . Lung cancer ([facility] dx'd 2015  . Obesity   . Palpitations    For event monitor 01/2012  . Sleep apnea    not on CPAP  . Tobacco abuse   . Tongue cancer ([facility] dx'd 2014    Patient Active Problem List   Diagnosis Date Noted  . Angina pectoris ([facility] 06/21/2014  . Midsternal chest pain 02/25/2014  . Chest pain 02/23/2014  . Right lower lobe  lung mass 06/06/2013  . Nodule of right lung 04/04/2013  . 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[place] 10/13/2012  . Tracheostomy stoma dilation ([facility] 09/27/2012  . Hemoptysis 09/27/2012  . Hemorrhage from tracheostomy stoma ([facility] 09/27/2012  . Lesion of tongue 07/07/2012  . HTN (hypertension) 08/25/2011  . Hip pain, bilateral 03/26/2011  . Low back pain 03/26/2011  . Anxiety 03/26/2011  . Alcohol abuse 02/26/2011  . Cardiomyopathy, ischemic-EF 50% by 2D Oct 2015 11/06/2009  . Dyslipidemia 09/26/2009  . SMOKER 09/26/2009  . CAD S/P multiple PCIs 09/26/2009    Past Surgical History:  Procedure Laterality Date  . CARDIAC CATHETERIZATION     ANTERIOR STEMI W/4  OVERLAPPING TAXUS STENTS IN THE PROXIMAL TO MID LAD  . CORONARY ANGIOPLASTY    . FRACTURE SURGERY Right    as a child,leg  . LEFT HEART CATHETERIZATION WITH CORONARY ANGIOGRAM N/A 06/21/2014   Procedure: LEFT HEART CATHETERIZATION WITH CORONARY ANGIOGRAM;  Surgeon: CBurnell Blanks MD;  [facility];  Service: Cardiovascular;  Laterality: N/A;  . LOBECTOMY Right 06/06/2013   Procedure: LOBECTOMY;  Surgeon: SMelrose Nakayama MD;  [place]  Service: Thoracic;  Laterality: Right;  .Marland KitchenVIDEO ASSISTED THORACOSCOPY (VATS)/WEDGE RESECTION Right 06/06/2013   Procedure: VIDEO ASSISTED  THORACOSCOPY (VATS)/WEDGE RESECTION;  Surgeon: Melrose Nakayama, MD;  Location: [place];  Service: Thoracic;  Laterality: Right;       Home Medications    Prior to Admission medications   Medication Sig Start Date End Date Taking? Authorizing Provider  acetaminophen (TYLENOL) 500 MG tablet Take 1,000 mg by mouth every 8 (eight) hours as needed (pain).    Historical Provider, MD  aspirin 81 MG tablet Take 1 tablet (81 mg total) by mouth daily. 02/25/14   Rogelia Mire, NP  atorvastatin (LIPITOR) 80 MG tablet Take 1 tablet (80 mg total) by mouth daily. 02/25/14   Rogelia Mire, NP  buPROPion (WELLBUTRIN SR) 150  MG 12 hr tablet Take 1 tablet (150 mg total) by mouth 2 (two) times daily. 02/25/14   Rogelia Mire, NP  doxycycline (VIBRA-TABS) 100 MG tablet Take 1 tablet (100 mg total) by mouth 2 (two) times daily. 12/20/15   Robyn Haber, MD  HYDROcodone-acetaminophen (NORCO/VICODIN) 5-325 MG tablet Take 1-2 tablets by mouth every 4 (four) hours as needed for moderate pain or severe pain. 11/28/15   Charlesetta Shanks, MD  levothyroxine (SYNTHROID) 125 MCG tablet Take 1 tablet (125 mcg total) by mouth daily before breakfast. 06/29/15   Holley Bouche, NP  meloxicam (MOBIC) 7.5 MG tablet Take 1 tablet (7.5 mg total) by mouth daily. 06/29/15   Holley Bouche, NP  metoprolol tartrate (LOPRESSOR) 25 MG tablet Take 0.5 tablets (12.5 mg total) by mouth 2 (two) times daily. 02/25/14   Rogelia Mire, NP  Multiple Vitamins-Minerals (MULTIVITAMIN WITH MINERALS) tablet Take 1 tablet by mouth daily.      Historical Provider, MD  nitroGLYCERIN (NITROSTAT) 0.4 MG SL tablet Place 1 tablet (0.4 mg total) under the tongue every 5 (five) minutes as needed (up to 3 doses). For chest pain 02/25/14   Rogelia Mire, NP  omeprazole (PRILOSEC) 20 MG capsule Take 1 capsule (20 mg total) by mouth daily. 11/28/15   Charlesetta Shanks, MD  traMADol (ULTRAM) 50 MG tablet Take 50 mg by mouth every 6 (six) hours as needed. For pain    Historical Provider, MD    Family History Family History  Problem Relation Age of Onset  . Kidney disease Mother     CHRONIC  . Cancer Sister   . Liver disease Sister     LIVER FAILURE    Social History Social History  Substance Use Topics  . Smoking status: Current Every Day Smoker    Packs/day: 0.50    Years: 40.00    Types: Cigars  . Smokeless tobacco: Never Used     Comment: Stopped smoking cigars day before surgery, 06/09/14 smoking 1/2 pack cigars daily, 06/29/15-continues to smoke 1/2 ppd of cigars  . Alcohol use 7.2 oz/week    12 Cans of beer per week     Comment: 8-12 BEERS  DAILY prior to surgery.  12 beers per week at present (06/29/15)     Allergies   Hydromorphone and Nubain [nalbuphine hcl]   Review of Systems Review of Systems No fever, no other skin lesions suggestive of infection  Physical Exam Triage Vital Signs ED Triage Vitals  Enc Vitals Group     BP 12/20/15 1107 142/91     Pulse Rate 12/20/15 1107 86     Resp 12/20/15 1107 18     Temp 12/20/15 1107 97.4 F (36.3 C)     Temp src --      SpO2 12/20/15 1107  100 %     Weight --      Height --      Head Circumference --      Peak Flow --      Pain Score 12/20/15 1108 9     Pain Loc --      Pain Edu? --      Excl. in [place]? --    No data found.   Updated Vital Signs BP 142/91   Pulse 86   Temp 97.4 F (36.3 C)   Resp 18   SpO2 100%  Speech is slurred    Physical Exam  The left elbow is diffusely erythematous and thickened without fluctuance. There is a 2 mm opening with scant yellow purulent drainage. Patient has full range of motion of his elbow. UC Treatments / Results  Labs (all labs ordered are listed, but only abnormal results are displayed) Labs Reviewed  AEROBIC CULTURE (SUPERFICIAL SPECIMEN)    EKG  EKG Interpretation None       Radiology No results found.  Procedures Procedures (including critical care time)  Medications Ordered in UC Medications  Tdap (BOOSTRIX) injection 0.5 mL (0.5 mLs Intramuscular Given 12/20/15 1153)     Initial Impression / Assessment and Plan / UC Course  I have reviewed the triage vital signs and the nursing notes.  Pertinent labs & imaging results that were available during my care of the patient were reviewed by me and considered in my medical decision making (see chart for details).  Clinical Course    Wound (left elbow) was stressed  Final Clinical Impressions(s) / UC Diagnoses   Final diagnoses:  Cellulitis of left upper extremity    New Prescriptions New Prescriptions   DOXYCYCLINE (VIBRA-TABS) 100 MG  TABLET    Take 1 tablet (100 mg total) by mouth 2 (two) times daily.  Tetanus shot and wound culture performed   Robyn Haber, MD 12/20/15 1155

## 2015-12-20 NOTE — ED Triage Notes (Signed)
Pt here for abscess to left elbow that is swollen and draining. Red and warm to touch.

## 2015-12-20 NOTE — Discharge Instructions (Signed)
You need to follow-up with her cancer doctor in 1-2 weeks.

## 2015-12-23 LAB — AEROBIC CULTURE W GRAM STAIN (SUPERFICIAL SPECIMEN)

## 2015-12-26 ENCOUNTER — Telehealth: Payer: Self-pay

## 2015-12-26 ENCOUNTER — Encounter: Payer: Self-pay | Admitting: Radiation Oncology

## 2015-12-26 NOTE — Progress Notes (Signed)
Patient was seen for infection at outpatient clinic.

## 2015-12-26 NOTE — Progress Notes (Signed)
I was informed pt had an abscess of the elbow and was seen at an outpatient clinic without followup for this. Trevor Craze, NP will address this as she sees appropriate; if outside her area of expertise she may refer him another clinic. Patient may benefit from getting a regular PCP for future issues if he does not have one.  -----------------------------------  [name], MD

## 2015-12-26 NOTE — Telephone Encounter (Signed)
I think this patient may be best suited to see Selena Lesser in Symptom Management?  Since this elbow abscess is not likely related to his cancer or subsequent treatment, he could see [place] or go to a local urgent care?    Liliane Channel, would you be able to call the patient and offer him an appointment in the Symptom Management clinic?  Otherwise, he should probably see a PCP/family doctor or go back to a local Urgent Care to help manage this recurrent infection for Mr. Trevor Griffin.   Just let me know if you need anything from me.   Thanks! [name], NP

## 2015-12-26 NOTE — Telephone Encounter (Signed)
Wife called to let Dr Isidore Moos know [facility] on his elbow. They did a culture and put him on doxycycline for 3 weeks. The outpatient clinic said they would let Dr Isidore Moos f/u on the culture. They have not heard anything. The elbow is still red and pussy and may need to be opened up again.

## 2015-12-27 ENCOUNTER — Encounter: Payer: Self-pay | Admitting: *Deleted

## 2015-12-27 NOTE — Telephone Encounter (Incomplete)
Just spoke with Jim's wife.  She indicated there has been some improvement since starting the doxycycline last Thursday though yes, still red and pussy (culture confirmed Staph aureus).  Doc at [facility] indicated he should follow-up elsewhere. I explained since this is unrelated  said she is going to see if she can get Clair Gulling in to see her doc Dr. Excell Seltzer at [facility] for follow-up as needed.

## 2016-01-01 ENCOUNTER — Ambulatory Visit (HOSPITAL_COMMUNITY)
Admission: EM | Admit: 2016-01-01 | Discharge: 2016-01-01 | Disposition: A | Discharge status: Home / Self Care | Payer: Medicare Other | Attending: Family Medicine | Admitting: Family Medicine

## 2016-01-01 ENCOUNTER — Encounter (HOSPITAL_COMMUNITY): Payer: Self-pay | Admitting: *Deleted

## 2016-01-01 DIAGNOSIS — M7022 Olecranon bursitis, left elbow: Secondary | ICD-10-CM | POA: Diagnosis not present

## 2016-01-01 DIAGNOSIS — Z5189 Encounter for other specified aftercare: Secondary | ICD-10-CM

## 2016-01-01 NOTE — Discharge Instructions (Signed)
Finish antibiotic, continue twice a day warm compress, return as needed.

## 2016-01-01 NOTE — ED Triage Notes (Signed)
Pt  Was   Seen   12  Days  Ago for  Elbow  Infection     He  Is  Here  Today   For  Recheck     Pt was  Placed  on  Anti  biotcs      The  Elbow  Is  Swollen     He  Has  A  Dressing in place    On arrival

## 2016-01-01 NOTE — ED Provider Notes (Signed)
[place]    CSN: [id number] Arrival date & time: 01/01/16  1007  First Provider Contact:  First MD Initiated Contact with Patient 01/01/16 1106        History   Chief Complaint No chief complaint on file.   HPI Trevor Griffin is a 60 y.o. male.   The history is provided by the patient and the spouse.  Arm Injury  Location:  Elbow Elbow location:  L elbow Injury: no   Pain details:    Quality:  Aching   Radiates to:  Does not radiate   Severity:  Mild   Onset quality:  Gradual   Duration:  3 weeks   Progression:  Worsening (seen 8/17 but according to wife the swelling continues.) Dislocation: no   Prior injury to area:  No Worsened by:  Movement Ineffective treatments:  None tried Associated symptoms: swelling   Associated symptoms: no fever     Past Medical History:  Diagnosis Date  . Alcohol abuse   . Anxiety   . CAD (coronary artery disease)    a. Ant STEMI 5/08 with 4 Taxus DES proximal to mid LAD;  b. 06/2007:  Stopped Plavix->anterior STEMI/stent thrombosis in LAD (PTCA), LCX 35md;  c. 10/12: Cath/PCI: prox to mid LAD stent ok with 10-20% ISR, mLAD 40%, CFX 30-40%, mCFX 90% (DES), EF 35%;  d. 02/2014 MV: antsept infarct w/ mild peri-infarct ischemia->med rx. e. cath 06/21/2014 no significant CAD, patent stents, EF 40-45%  . Chronic pain   . COPD (chronic obstructive pulmonary disease) ([place]   . Depression   . Fatigue    sleep test neg 2008  . GERD (gastroesophageal reflux disease)   . Headache(784.0)   . Hx of radiation therapy 10/25/12-12/10/12    Right Tongue/ Bilateral Neck //Total Dose, 60 Gy in 30 Fractions  . Hyperlipidemia   . Hypertension   . Ischemic cardiomyopathy    a. Echo 08/20/11 with EF 50-55% (previously 35%);  b. 02/2014 Echo: EF 45-50-%, mid-apicalanteroseptal AK, Gr 1 dd, mild AI, midlly dil LA.  . Lung cancer ([place] dx[place] 2015  . Obesity   . Palpitations    For event monitor 01/2012  . Sleep apnea    not on CPAP  .  Tobacco abuse   . Tongue cancer ([facility] dx'd 2014    Patient Active Problem List   Diagnosis Date Noted  . Angina pectoris ([facility] 06/21/2014  . Midsternal chest pain 02/25/2014  . Chest pain 02/23/2014  . Right lower lobe lung mass 06/06/2013  . Nodule of right lung 04/04/2013  . 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[facility] 10/13/2012  . Tracheostomy stoma dilation ([facility] 09/27/2012  . Hemoptysis 09/27/2012  . Hemorrhage from tracheostomy stoma ([facility] 09/27/2012  . Lesion of tongue 07/07/2012  . HTN (hypertension) 08/25/2011  . Hip pain, bilateral 03/26/2011  . Low back pain 03/26/2011  . Anxiety 03/26/2011  . Alcohol abuse 02/26/2011  . Cardiomyopathy, ischemic-EF 50% by 2D Oct 2015 11/06/2009  . Dyslipidemia 09/26/2009  . SMOKER 09/26/2009  . CAD S/P multiple PCIs 09/26/2009    Past Surgical History:  Procedure Laterality Date  . CARDIAC CATHETERIZATION     ANTERIOR STEMI W/4  OVERLAPPING TAXUS STENTS IN THE PROXIMAL TO MID LAD  . CORONARY ANGIOPLASTY    . FRACTURE SURGERY Right    as a child,leg  . LEFT HEART CATHETERIZATION WITH CORONARY ANGIOGRAM N/A 06/21/2014   Procedure: LEFT HEART CATHETERIZATION WITH CORONARY ANGIOGRAM;  Surgeon: CBurnell Blanks  MD;  Location: [place] CATH LAB;  Service: Cardiovascular;  Laterality: N/A;  . LOBECTOMY Right 06/06/2013   Procedure: LOBECTOMY;  Surgeon: Melrose Nakayama, MD;  Location: [place];  Service: Thoracic;  Laterality: Right;  Marland Kitchen VIDEO ASSISTED THORACOSCOPY (VATS)/WEDGE RESECTION Right 06/06/2013   Procedure: VIDEO ASSISTED THORACOSCOPY (VATS)/WEDGE RESECTION;  Surgeon: Melrose Nakayama, MD;  Location: [place];  Service: Thoracic;  Laterality: Right;       Home Medications    Prior to Admission medications   Medication Sig Start Date End Date Taking? Authorizing Provider  acetaminophen (TYLENOL) 500 MG tablet Take 1,000 mg by mouth every 8 (eight) hours as needed (pain).    Historical Provider, MD  aspirin 81 MG tablet  Take 1 tablet (81 mg total) by mouth daily. 02/25/14   Rogelia Mire, NP  atorvastatin (LIPITOR) 80 MG tablet Take 1 tablet (80 mg total) by mouth daily. 02/25/14   Rogelia Mire, NP  buPROPion (WELLBUTRIN SR) 150 MG 12 hr tablet Take 1 tablet (150 mg total) by mouth 2 (two) times daily. 02/25/14   Rogelia Mire, NP  doxycycline (VIBRA-TABS) 100 MG tablet Take 1 tablet (100 mg total) by mouth 2 (two) times daily. 12/20/15   Robyn Haber, MD  HYDROcodone-acetaminophen (NORCO/VICODIN) 5-325 MG tablet Take 1-2 tablets by mouth every 4 (four) hours as needed for moderate pain or severe pain. 11/28/15   Charlesetta Shanks, MD  levothyroxine (SYNTHROID) 125 MCG tablet Take 1 tablet (125 mcg total) by mouth daily before breakfast. 06/29/15   Holley Bouche, NP  meloxicam (MOBIC) 7.5 MG tablet Take 1 tablet (7.5 mg total) by mouth daily. 06/29/15   Holley Bouche, NP  metoprolol tartrate (LOPRESSOR) 25 MG tablet Take 0.5 tablets (12.5 mg total) by mouth 2 (two) times daily. 02/25/14   Rogelia Mire, NP  Multiple Vitamins-Minerals (MULTIVITAMIN WITH MINERALS) tablet Take 1 tablet by mouth daily.      Historical Provider, MD  nitroGLYCERIN (NITROSTAT) 0.4 MG SL tablet Place 1 tablet (0.4 mg total) under the tongue every 5 (five) minutes as needed (up to 3 doses). For chest pain 02/25/14   Rogelia Mire, NP  omeprazole (PRILOSEC) 20 MG capsule Take 1 capsule (20 mg total) by mouth daily. 11/28/15   Charlesetta Shanks, MD  traMADol (ULTRAM) 50 MG tablet Take 50 mg by mouth every 6 (six) hours as needed. For pain    Historical Provider, MD    Family History Family History  Problem Relation Age of Onset  . Kidney disease Mother     CHRONIC  . Cancer Sister   . Liver disease Sister     LIVER FAILURE    Social History Social History  Substance Use Topics  . Smoking status: Current Every Day Smoker    Packs/day: 0.50    Years: 40.00    Types: Cigars  . Smokeless tobacco: Never  Used     Comment: Stopped smoking cigars day before surgery, 06/09/14 smoking 1/2 pack cigars daily, 06/29/15-continues to smoke 1/2 ppd of cigars  . Alcohol use 7.2 oz/week    12 Cans of beer per week     Comment: 8-12 BEERS DAILY prior to surgery.  12 beers per week at present (06/29/15)     Allergies   Hydromorphone and Nubain [nalbuphine hcl]   Review of Systems Review of Systems  Constitutional: Negative.  Negative for fever.  Musculoskeletal: Positive for joint swelling.  All other systems reviewed and are negative.  Physical Exam Triage Vital Signs ED Triage Vitals  Enc Vitals Group     BP      Pulse      Resp      Temp      Temp src      SpO2      Weight      Height      Head Circumference      Peak Flow      Pain Score      Pain Loc      Pain Edu?      Excl. in [place]?    No data found.   Updated Vital Signs There were no vitals taken for this visit.  Visual Acuity Right Eye Distance:   Left Eye Distance:   Bilateral Distance:    Right Eye Near:   Left Eye Near:    Bilateral Near:     Physical Exam  Constitutional: He is oriented to person, place, and time. He appears well-developed and well-nourished.  Musculoskeletal: Normal range of motion. He exhibits tenderness.  Olecranon swelling, min soreness, no warmth or erythema, no drainage.  Neurological: He is alert and oriented to person, place, and time.  Skin: Skin is warm and dry.  Nursing note and vitals reviewed.    UC Treatments / Results  Labs (all labs ordered are listed, but only abnormal results are displayed) Labs Reviewed - No data to display  EKG  EKG Interpretation None       Radiology No results found.  Procedures .Marland KitchenIncision and Drainage Date/Time: 01/01/2016 11:23 AM Performed by: Billy Fischer Authorized by: Ihor Gully D   Consent:    Consent obtained:  Verbal   Consent given by:  Patient   Alternatives discussed:  No treatment Location:    Type:  Bursa    Size:  Golf ball   Location:  Upper extremity   Upper extremity location:  Elbow   Elbow location:  L elbow Procedure type:    Complexity:  Simple Procedure details:    Incision types:  Single straight and stab incision   Incision depth:  Dermal   Scalpel blade:  11   Drainage:  Serosanguinous   Drainage amount:  Moderate   Wound treatment:  Wound left open   Packing materials:  None Post-procedure details:    Patient tolerance of procedure:  Tolerated well, no immediate complications Comments:     No purulent fluid.   (including critical care time)  Medications Ordered in UC Medications - No data to display   Initial Impression / Assessment and Plan / UC Course  I have reviewed the triage vital signs and the nursing notes.  Pertinent labs & imaging results that were available during my care of the patient were reviewed by me and considered in my medical decision making (see chart for details).  Clinical Course      Final Clinical Impressions(s) / UC Diagnoses   Final diagnoses:  None    New Prescriptions New Prescriptions   No medications on file     Billy Fischer, MD 01/01/16 1132

## 2016-02-14 ENCOUNTER — Ambulatory Visit
Admission: RE | Admit: 2016-02-14 | Discharge: 2016-02-14 | Disposition: A | Discharge status: Home / Self Care | Payer: Medicare Other | Source: Ambulatory Visit | Attending: Radiation Oncology | Admitting: Radiation Oncology

## 2016-02-14 ENCOUNTER — Ambulatory Visit (HOSPITAL_BASED_OUTPATIENT_CLINIC_OR_DEPARTMENT_OTHER): Payer: Medicare Other | Admitting: Adult Health

## 2016-02-14 VITALS — BP 108/66 | HR 94 | Resp 18 | Ht 68.0 in | Wt 195.3 lb

## 2016-02-14 DIAGNOSIS — Z85810 Personal history of malignant neoplasm of tongue: Secondary | ICD-10-CM

## 2016-02-14 DIAGNOSIS — E038 Other specified hypothyroidism: Secondary | ICD-10-CM

## 2016-02-14 DIAGNOSIS — C023 Malignant neoplasm of anterior two-thirds of tongue, part unspecified: Secondary | ICD-10-CM

## 2016-02-14 DIAGNOSIS — E039 Hypothyroidism, unspecified: Secondary | ICD-10-CM

## 2016-02-14 DIAGNOSIS — R05 Cough: Secondary | ICD-10-CM | POA: Diagnosis not present

## 2016-02-14 DIAGNOSIS — F1099 Alcohol use, unspecified with unspecified alcohol-induced disorder: Secondary | ICD-10-CM | POA: Diagnosis not present

## 2016-02-14 DIAGNOSIS — R1310 Dysphagia, unspecified: Secondary | ICD-10-CM | POA: Diagnosis not present

## 2016-02-14 DIAGNOSIS — Z720 Tobacco use: Secondary | ICD-10-CM | POA: Diagnosis not present

## 2016-02-14 DIAGNOSIS — Z85118 Personal history of other malignant neoplasm of bronchus and lung: Secondary | ICD-10-CM | POA: Diagnosis not present

## 2016-02-14 DIAGNOSIS — R053 Chronic cough: Secondary | ICD-10-CM

## 2016-02-14 DIAGNOSIS — IMO0002 Reserved for concepts with insufficient information to code with codable children: Secondary | ICD-10-CM

## 2016-02-14 DIAGNOSIS — R221 Localized swelling, mass and lump, neck: Secondary | ICD-10-CM | POA: Diagnosis not present

## 2016-02-14 DIAGNOSIS — E860 Dehydration: Secondary | ICD-10-CM

## 2016-02-14 DIAGNOSIS — C3431 Malignant neoplasm of lower lobe, right bronchus or lung: Secondary | ICD-10-CM

## 2016-02-14 LAB — COMPREHENSIVE METABOLIC PANEL
ALK PHOS: 103 U/L (ref 40–150)
ALT: 13 U/L (ref 0–55)
ANION GAP: 10 meq/L (ref 3–11)
AST: 15 U/L (ref 5–34)
Albumin: 3.6 g/dL (ref 3.5–5.0)
BILIRUBIN TOTAL: 0.41 mg/dL (ref 0.20–1.20)
BUN: 10.7 mg/dL (ref 7.0–26.0)
CO2: 24 meq/L (ref 22–29)
Calcium: 9.1 mg/dL (ref 8.4–10.4)
Chloride: 99 mEq/L (ref 98–109)
Creatinine: 0.9 mg/dL (ref 0.7–1.3)
EGFR: 89 mL/min/{1.73_m2} — AB (ref >90)
GLUCOSE: 112 mg/dL (ref 70–140)
POTASSIUM: 4.2 meq/L (ref 3.5–5.1)
SODIUM: 133 meq/L — AB (ref 136–145)
TOTAL PROTEIN: 7.2 g/dL (ref 6.4–8.3)

## 2016-02-14 LAB — TSH: TSH: 3.811 m[IU]/L (ref 0.320–4.118)

## 2016-02-14 NOTE — Progress Notes (Addendum)
ROS    CLINIC:  Survivorship  REASON FOR VISIT:  Follow-up for head & neck cancer.   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 Right neck LN dissection (zone 2 & 3)---1/21 LNs positive for metastatic [facility]      09/16/2012 Surgery    Tracheotomy, bilateral selective neck dissection, subtotal glossectomy with left radial forearm fasicocutaneous free flap reconstruction with split-thickness skin graft from left thigh ([place])       09/21/2012 Surgery    G-tube placement [facility])      09/23/2012 Procedure    Trach removal [facility])      10/13/2012 Initial Diagnosis    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[place])      10/15/2012 Imaging    Staging CT c/a/p: No acute process or evidence of metastatic disease in the chest, abd, or pelvis. Similar tiny RUL lung nodule since 2006, consistent with a benign etiology.       10/25/2012 - 12/10/2012 Radiation Therapy    [facility] Isidore Moos). Right Tongue/ Bilateral Neck /// Total Dose, 60 Gy in 30 Fractions      03/03/2013 Imaging    CT chest: Enlarging (and now cavitary) nodule in RLL measuring 1.3 cm (previous exam measured 4 mm). Suspicious for SCC metastasis.       02/2013 Procedure    PEG removed       06/06/2013 Surgery    Right video-assisted thoracoscopy, wedge resection, thoracoscopic right lower lobectomy, lymph node dissection. [facility])      07/13/2014 Imaging    CT chest: s/p RUL lobectomy. No findings to suggest recurrent disease or new mets to thorax. Tiny 4 mm LUL nodule unchanged from 10/2012 and considered benign.        12/14/2014 Imaging    CT chest: Stable postoperative chest CT. No evidence of local reurrence or metastatic  disease. Stable postsurgical scarring in right lung.       06/29/2015 Imaging    CXR: Patient evaluated for acute concerns regarding fatigue, cough, and dysphagia.  CXR results pending.         INTERVAL HISTORY:  Trevor Griffin presents with his wife for routine follow-up for his h/o H&N cancer.  He tells me he has been "feeling really bad."     He reports being "dizzy all the time." He noticed this about one month ago; the dizziness is worse with standing. He tells me that he gets migraine headaches every day. His wife interjects and tells me that he has been very "grouchy" lately as well; she thinks this is because he doesn't feel well, but she also states "he is an alcoholic; he lives on coffee, beer, and peanut butter sandwiches." The patient then became verbally very upset with his wife during visit, and asked that she no longer speak to this provider during the visit about his condition.  Trevor Griffin tells me that his chronic cough is getting worse. He states that when he "gets into a coughing spell" he has "shaking spells" that he calls "seizures." He denies losing consciousness during these spells. He tells me that he gets choked a lot on his foods. When I saw the patient last several months ago, I  placed a referral to GI for the patient to be seen for possible esophageal dilation. He did not go to this appointment; he tells me that "I had the flu a few times and didn't feel good enough to go." I asked him to give me a 24-hour recall of his diet: -Breakfast: He did not eat breakfast yesterday. -Lunch: He did not eat lunch yesterday. -Dinner: Peanut butter sandwich.  He tells me that he has about 3 cups of coffee a day. He also drinks about 8 beers per day. His wife attempts to interject to say that he is drinking much more than 8 beers a day, but the patient asks her not to speak. He tells me he is drinking some water; "about 3 bottles per day."  He also continues to smoke. He smokes about  3/4 pack per day. He is not interested in stopping smoking.  He tells me that he has noticed a new swelling right underneath his right jaw. He has not seen his the ENT physician, Dr. Vicie Mutters at [place], since his surgery; has been non-compliant with visits there.    CURRENT MEDICATIONS:  Current Outpatient Prescriptions on File Prior to Visit  Medication Sig Dispense Refill  . acetaminophen (TYLENOL) 500 MG tablet Take 1,000 mg by mouth every 8 (eight) hours as needed (pain).    Marland Kitchen aspirin 81 MG tablet Take 1 tablet (81 mg total) by mouth daily. 30 tablet 1  . atorvastatin (LIPITOR) 80 MG tablet Take 1 tablet (80 mg total) by mouth daily. 30 tablet 6  . levothyroxine (SYNTHROID) 125 MCG tablet Take 1 tablet (125 mcg total) by mouth daily before breakfast. 90 tablet 0  . meloxicam (MOBIC) 7.5 MG tablet Take 1 tablet (7.5 mg total) by mouth daily. 90 tablet 0  . metoprolol tartrate (LOPRESSOR) 25 MG tablet Take 0.5 tablets (12.5 mg total) by mouth 2 (two) times daily. 30 tablet 6  . omeprazole (PRILOSEC) 20 MG capsule Take 1 capsule (20 mg total) by mouth daily. 30 capsule 0  . traMADol (ULTRAM) 50 MG tablet Take 50 mg by mouth every 6 (six) hours as needed. For pain    . buPROPion (WELLBUTRIN SR) 150 MG 12 hr tablet Take 1 tablet (150 mg total) by mouth 2 (two) times daily. (Patient not taking: Reported on 02/14/2016) 60 tablet 1  . HYDROcodone-acetaminophen (NORCO/VICODIN) 5-325 MG tablet Take 1-2 tablets by mouth every 4 (four) hours as needed for moderate pain or severe pain. (Patient not taking: Reported on 02/14/2016) 20 tablet 0  . Multiple Vitamins-Minerals (MULTIVITAMIN WITH MINERALS) tablet Take 1 tablet by mouth daily.      . nitroGLYCERIN (NITROSTAT) 0.4 MG SL tablet Place 1 tablet (0.4 mg total) under the tongue every 5 (five) minutes as needed (up to 3 doses). For chest pain (Patient not taking: Reported on 02/14/2016) 25 tablet 3   No current facility-administered medications on  file prior to visit.     ALLERGIES:  Allergies  Allergen Reactions  . Hydromorphone Nausea Only and Other (See Comments)    Headaches also  . Nubain [Nalbuphine Hcl] Nausea Only and Other (See Comments)    Headaches also     PHYSICAL EXAM:  Vitals:   02/14/16 1222 02/14/16 1224  BP: 121/69 108/66  Pulse: 84 94  Resp:    *Orthostatic VS as above.   Filed Weights   02/14/16 1128  Weight: 195 lb 4.8 oz (88.6 kg)   Weight Date  195 lb 4.8 oz (  88.6 kg) 02/14/16  190 lb (86.183 kg) 06/29/15  191 lb 4.8 oz (86.773 kg) 12/15/14  173 lb 6.4 oz (78.654 kg) 06/09/14  155 lb 3.2 oz (70.398 kg) 08/10/13  166 lb 4 oz (75.411 kg) Pre-treatment: 07/07/12   General: Chronically-ill appearing male in no acute distress.  Accompanied by his wife today.  HEENT: Head is atraumatic and normocephalic.  Pupils equal and reactive to light. Conjunctivae clear without exudate.  Sclerae anicteric. Oral mucosa is pink and dry.  Tongue s/p resection. Oropharynx is slightly erythematous. Dentures in place. Lymph: (R) submandibular region with subtle nodularity, about 1 cm x 0.5 cm, scar tissue vs adenopathy. No supraclavicular, infraclavicular, or cervical lymphadenopathy noted. (+) mild neck lymphedema.  Neck: (+) neck fibrosis. Healed scars. Skin on neck is dry; no open skin lesions.  Cardiovascular: Normal rate and rhythm. Respiratory:  Diminished breath sounds to all lung fields (RLL most diminished s/p wedge resection). Chest expansion symmetric without accessory muscle use. Breathing non-labored.   GI: Abdomen soft and round. No tenderness to palpation. Bowel sounds mildly hypoactive.  GU: Deferred.   Neuro: Dizzy when standing. Steady gait. Psych: Flat affect.  Extremities: No edema.  Skin: Warm and dry.    LABORATORY DATA:  Results for ARTIN, MCEUEN (MRN [id number]) as of 02/14/2016  Ref. Range 02/14/2016 11:10  TSH Latest Ref Range: 0.320 - 4.118 m(IU)/L 3.811   Historical TSH values:  Ref.  Range 07/13/2014 09:30 12/14/2014 11:00 06/29/2015 11:00  TSH Latest Ref Range: 0.320-4.118 m(IU)/L 5.620 (H) 8.168 (H) 11.181 (H)   CMET: 02/14/16    DIAGNOSTIC IMAGING:  Most recent chest CT: 08/06/15  Most recent CT head: 08/13/15    ASSESSMENT & PLAN:  Mr.  Griffin is a pleasant 60 y.o. male with history of Stage III (T3N1M0), treated with surgery and radiation; completed treatment on 12/10/12.  In 02/2013, pt found to have RLL nodule, PET scan (+) for lung malignancy; treated with VATS/wedge resection/RLL lobectomy in 06/2013.  Patient presents to survivorship clinic today for continued follow-up.   1. History of cancer of the tongue:  There is subtle nodularity noted at the right submandibular ridge. This is questionable scar tissue versus adenopathy. Given his symptoms, the fact that he is still drinking and smoking heavily putting him at increased risk of recurrence, I will obtain restaging with CT neck to assess this new nodularity. I will make Dr. Isidore Moos aware of this plan.  2. History of Squamous cell carcinoma of right lung, metastatic tongue cancer vs. Primary bronchogenic cancer:  Trevor Griffin last CT chest was in 08/2015 and showed no evidence of disease in the chest. He was originally due for his next CT chest in 08/2016 for annual surveillance, but given his persistent cough and overall feelings of malaise, I will obtain a CT chest early as part of his restaging.  3. New, right submandibular nodularity: I will order CT soft tissue neck to evaluate this nodularity. It could very easily be scar tissue, from his previous surgeries and adjuvant radiation. However, given his symptoms and the fact that he tells me this nodularity is new, and it was not noted on my previous exam, I will restage him with CT. I will see him back in clinic in 1 month to review the results of both the CT neck and CT chest.  4. Chronic, persistent cough: Trevor Griffin cough is chronic and persistent. He continues  to smoke quite heavily. He is actually smoking more than he did  at her previous visit (he has increased usage from one half pack to three-quarter pack per day ). We will restage with CT chest as stated above.  5. Dehydration/Nutritional deficiency: The patient is clinically dehydrated on exam, and noted to have mild orthostatic hypotension as well. I shared with him that he needs to consistently eat foods rich in nutrients, protein, and calories. I shared with him my concerns regarding his excessive alcohol consumption per day. I shared that both coffee and alcohol are dehydrating substances, which are likely contributing to his dizziness and headaches. I encouraged him to drink Ensure/Boost supplements, if he is unable to eat a well-balanced diet. He tells me that he does not like those products, and will not strengthen. I encouraged him to push oral intake, including water, Gatorade, and other non-caffeinated, non-alcoholic beverages. I will obtain CMET today to look at his kidney function and basic electrolytes. I will call in prescriptions for potassium, if necessary.  -Addendum: CMET labs reviewed by this provider; he is mildly hyponatremic with serum sodium 133. His kidney function is okay at this time. Liver function is normal. I do not think he requires additional intervention for electrolyte replacement at this time.  6. Alcohol use disorder:  I shared my very serious concerns with Trevor Griffin regarding his alcohol abuse. I shared with him that I believe many of his symptoms can be attributed to his alcohol use. He adamantly denies that the alcohol is contributing to any of his symptoms, and is unwilling to stop drinking or even decrease his alcohol consumption at this time. I asked him if he ever has to have an "eye-opener" first thing in the morning; he denies this. His wife silently nodded her head yes" when I asked him this question. When asked if he can ever skip a day without drinking, and if he  experience any side effects with this, the patient stated "I have gone about 2 weeks without drinking and I was fine." His wife shook her head "no" in the background and motioned to me that he has never done this. I will make our social workers aware of Trevor Griffin's condition, and perhaps they may be able to offer additional resources for him, as needed.  7. Dizziness, headaches, and periodic uncontrolled upper body movements/tremors: The patient had a CT head back in 08/2015 for many of the similar complaints he still has today. I shared with him that the CT of his brain was normal at that time. Do not suspect that he is actually having some "seizures", although it is possible he is having some tremors which may be associated with his alcohol abuse. I do not think it is necessary to re-image his brain at this time. I encouraged him to increase his non-caffeinated, non-alcoholic beverages to see if this may alleviate many of his symptoms.  8. Dysphagia/?Esophageal stricture:  Trevor Griffin weight is stable, but I remain concerned about his dysphagia and possible esophageal stricture. I talked to him again about seeing a GI specialist for possible esophageal dilation. I shared with him that I would be happy to place the referral again, if he would agree to go to the consultation. He tells me that he is interested in seeing a GI specialist and agrees to go for a visit. Therefore I have re-entered a referral to GI for consideration of esophageal dilation for possible esophageal stricture, which may be related to his treatment for H&N cancer, but also may be related to his significant alcohol  and tobacco abuse.   10. Disordered mood: Trevor Griffin does have flat affect and appears to clinically be depressed. He is notably angry particularly with his family as a trigger, as evidenced by the patient berating his wife during our visit today. He reported that he had only been taking the Wellbutrin SR as needed. I  reiterated the importance of taking the Wellbutrin as prescribed. I explained that it is not a medicine that should be taken as needed, as it is an antidepressant and requires a steady state in the blood to offer mood stability benefit. I encouraged him to restart the Wellbutrin SR 150 mg by mouth twice daily, as previously prescribed by Dr. Isidore Moos. This may help improve his mood, and may provide secondary benefit of decreasing his tobacco use. He tells me he will give it a try. I reiterated that it may take at least 4 weeks for him to notice a difference, and he should be patient while waiting to see if the medication provides him benefit. He agreed with this plan.  11. Hypothyroidism: His TSH is normal today. Therefore he will maintain his current dose of Synthroid at 125 g per day.  12. Medical non-compliance: Trevor Griffin has a rather significant history of medical noncompliance. I encouraged him to maintain his appointments with his doctors, as we want to provide the best care for him in his continued recovery and healing. I will plan to see him at the cancer center in about one month's, following up with his restaging CT scan results.   Dispo: -CT neck and chest for restaging ASAP.  -Return to cancer center in 1 month to follow-up with NP or Dr. Isidore Moos, pending results of scans.   A total of 50 minutes was spent in face-to-face care of this patient, with greater than 50% of that time in counseling and care-coordination.    [name], NP [place] [phone]

## 2016-02-16 ENCOUNTER — Encounter: Payer: Self-pay | Admitting: Adult Health

## 2016-02-20 ENCOUNTER — Telehealth: Payer: Self-pay | Admitting: *Deleted

## 2016-02-20 NOTE — Telephone Encounter (Signed)
Called pt to make sure they had received message abt CT scan on Friday 02/22/16, not 2018 error below in note. Talked to his wife understood the message left to VM, While on phone, pt's wife asked how his latest labs were, which were fine. Pt wanted to know why a CBC wasn't drawn b/c Hgb was low when it was last drawn and she said he's been feeling very fatigued and worn down. Pt wanted to know if a CBC can be ordered and drawn on Friday when he comes for test. I told her I will call him back on Friday to let him know after speaking to NP. Message to be fwd to [employer].

## 2016-02-20 NOTE — Telephone Encounter (Signed)
Called pt and left message to let pt know CT scan is scheduled for 02/21/17 at 12:30p. Left message to pt's VM to instruct him that he can only have liquids 4 hrs prior to test, not solid food. Pt knows he can reach this nurse if he has any questions concerning this at [phone]. Message to be fwd to [employer].

## 2016-02-21 ENCOUNTER — Other Ambulatory Visit: Payer: Self-pay | Admitting: Adult Health

## 2016-02-21 DIAGNOSIS — R5383 Other fatigue: Secondary | ICD-10-CM

## 2016-02-21 NOTE — Telephone Encounter (Signed)
I am happy to order a CBC. I will place the orders.   [name], NP [place] [phone]

## 2016-02-22 ENCOUNTER — Telehealth: Payer: Self-pay | Admitting: *Deleted

## 2016-02-22 ENCOUNTER — Other Ambulatory Visit: Payer: Medicare Other

## 2016-02-22 ENCOUNTER — Ambulatory Visit (HOSPITAL_COMMUNITY): Payer: Medicare Other

## 2016-02-22 NOTE — Telephone Encounter (Signed)
Called pt to let him know we will do labs also today(CBC) Pt informed me that b/c of family emergency he will not be able to come today for test or labs. Wife said she called to cancel appt. I will call scheduling to make sure and reschedule appt before pt sees Gretchen,NP on the 16th of Nov. Message to be fwd to G. Dawson,NP.

## 2016-02-25 ENCOUNTER — Telehealth: Payer: Self-pay | Admitting: *Deleted

## 2016-02-25 NOTE — Telephone Encounter (Signed)
Called and spoke to pt to let him know the appt for the CT of Chest and Neck is scheduled for 03/07/16 at 10:00 at [facility]. Pt verbalized understanding. No further concerns. Message to be fwd to [employer].

## 2016-03-06 ENCOUNTER — Telehealth: Payer: Self-pay | Admitting: *Deleted

## 2016-03-06 NOTE — Telephone Encounter (Signed)
Called to remind pt abt appt tomorrow CT at [facility] @ 10 and labs at 11:30. Verbalized understanding. Message to be fwd to [employer].

## 2016-03-07 ENCOUNTER — Other Ambulatory Visit (HOSPITAL_BASED_OUTPATIENT_CLINIC_OR_DEPARTMENT_OTHER): Payer: Medicare Other

## 2016-03-07 ENCOUNTER — Ambulatory Visit (HOSPITAL_COMMUNITY)
Admission: RE | Admit: 2016-03-07 | Discharge: 2016-03-07 | Disposition: A | Discharge status: Home / Self Care | Payer: Medicare Other | Source: Ambulatory Visit | Attending: Adult Health | Admitting: Adult Health

## 2016-03-07 DIAGNOSIS — R221 Localized swelling, mass and lump, neck: Secondary | ICD-10-CM

## 2016-03-07 DIAGNOSIS — C140 Malignant neoplasm of pharynx, unspecified: Secondary | ICD-10-CM | POA: Diagnosis not present

## 2016-03-07 DIAGNOSIS — R5383 Other fatigue: Secondary | ICD-10-CM

## 2016-03-07 DIAGNOSIS — I700 Atherosclerosis of aorta: Secondary | ICD-10-CM | POA: Insufficient documentation

## 2016-03-07 DIAGNOSIS — C023 Malignant neoplasm of anterior two-thirds of tongue, part unspecified: Secondary | ICD-10-CM | POA: Diagnosis not present

## 2016-03-07 DIAGNOSIS — R05 Cough: Secondary | ICD-10-CM

## 2016-03-07 DIAGNOSIS — K8020 Calculus of gallbladder without cholecystitis without obstruction: Secondary | ICD-10-CM | POA: Diagnosis not present

## 2016-03-07 DIAGNOSIS — C3431 Malignant neoplasm of lower lobe, right bronchus or lung: Secondary | ICD-10-CM | POA: Diagnosis not present

## 2016-03-07 DIAGNOSIS — I2510 Atherosclerotic heart disease of native coronary artery without angina pectoris: Secondary | ICD-10-CM | POA: Insufficient documentation

## 2016-03-07 DIAGNOSIS — R053 Chronic cough: Secondary | ICD-10-CM

## 2016-03-07 DIAGNOSIS — C3411 Malignant neoplasm of upper lobe, right bronchus or lung: Secondary | ICD-10-CM | POA: Diagnosis not present

## 2016-03-07 LAB — CBC WITH DIFFERENTIAL/PLATELET
BASO%: 1 % (ref 0.0–2.0)
BASOS ABS: 0.1 10*3/uL (ref 0.0–0.1)
EOS ABS: 0.1 10*3/uL (ref 0.0–0.5)
EOS%: 1.3 % (ref 0.0–7.0)
HEMATOCRIT: 34.5 % — AB (ref 38.4–49.9)
HEMOGLOBIN: 10.7 g/dL — AB (ref 13.0–17.1)
LYMPH#: 0.7 10*3/uL — AB (ref 0.9–3.3)
LYMPH%: 14.9 % (ref 14.0–49.0)
MCH: 24.1 pg — AB (ref 27.2–33.4)
MCHC: 31 g/dL — ABNORMAL LOW (ref 32.0–36.0)
MCV: 77.8 fL — AB (ref 79.3–98.0)
MONO#: 0.7 10*3/uL (ref 0.1–0.9)
MONO%: 14.8 % — AB (ref 0.0–14.0)
NEUT%: 68 % (ref 39.0–75.0)
NEUTROS ABS: 3.3 10*3/uL (ref 1.5–6.5)
Platelets: 240 10*3/uL (ref 140–400)
RBC: 4.43 10*6/uL (ref 4.20–5.82)
RDW: 18.8 % — AB (ref 11.0–14.6)
WBC: 4.9 10*3/uL (ref 4.0–10.3)

## 2016-03-07 MED ORDER — IOPAMIDOL (ISOVUE-300) INJECTION 61%
75.0000 mL | Freq: Once | INTRAVENOUS | Status: AC | PRN
  Administered 2016-03-07: 75 mL via INTRAVENOUS

## 2016-03-10 ENCOUNTER — Telehealth: Payer: Self-pay | Admitting: Adult Health

## 2016-03-10 ENCOUNTER — Encounter: Payer: Self-pay | Admitting: Hematology and Oncology

## 2016-03-10 ENCOUNTER — Telehealth: Payer: Self-pay | Admitting: Hematology and Oncology

## 2016-03-10 NOTE — Telephone Encounter (Signed)
Appt scheduled w/[facility] for 11/16'@2pm'$ . Appt date and time given to the pt and his spouse. Letter will be mailed to the pt.

## 2016-03-10 NOTE — Telephone Encounter (Signed)
I called to speak with Trevor Griffin to discuss the results of his CT scans. His significant other, [name], tells me that their son was recently hospitalized for behavioral health issues & and that [name] was not home at this time. I asked when may be the best time for me to call him to discuss these results, and she asked that I give them a return call tomorrow morning around 11 AM.  I shared with her that I will do my best to return the call tomorrow to discuss these results and next steps.   [name], NP [place] [phone]

## 2016-03-11 ENCOUNTER — Telehealth: Payer: Self-pay | Admitting: Adult Health

## 2016-03-11 ENCOUNTER — Other Ambulatory Visit: Payer: Self-pay | Admitting: Adult Health

## 2016-03-11 DIAGNOSIS — J189 Pneumonia, unspecified organism: Secondary | ICD-10-CM

## 2016-03-11 MED ORDER — AMOXICILLIN-POT CLAVULANATE 875-125 MG PO TABS: 1.0000 | ORAL_TABLET | Freq: Two times a day (BID) | ORAL | 0 refills | Status: DC

## 2016-03-11 MED FILL — AMOX TR-K CLV 875-125 MG TA: 875-125 | 7 days supply | Qty: 14 | Fill #0

## 2016-03-11 NOTE — Telephone Encounter (Signed)
I spoke directly with Mr. Terwilliger to review his CT chest and CT neck results. I shared with him that the CT neck did show a slightly enlarged lymph node in the area we were concerned about in the right neck, but this is favored to be benign per the radiologist's report. The CT chest however shows some suspicious findings in the right lung, which could be cancer versus infectious etiologies.  There have been several discussions with Dr. Alvy Bimler, Dr. Isidore Moos, and myself. We will present this patient's case at the next H&N tumor board conference. In the meantime, I explained that we would like to give him one week of antibiotics with Augmentin; he understands that if this is infectious, then the findings on the CT chest should improve after completion of antibiotics.  Dr. Alvy Bimler recommended we give the patient prednisone along with the antibiotics. However, Mr. Sine tells me that he is very concerned about his finances and is not sure if he will be able to afford the antibiotic. Therefore, I will forego ordering the prednisone and we will start with the antibiotic alone. I encouraged him to pick up this prescription as soon as possible, as if this is infectious we want it treated sooner rather than later; likewise, if this is cancer in the lung, taking the antibiotics will give [place] more information. He voiced understanding and appreciation for my call. I have e-prescribed Augmentin 875-'125mg'$  to [facility], as they often work with patients who have financial difficulties. I encouraged him to call me if medication was too expensive and we can work on alternatives.  Right now he is scheduled to see me on 03/20/16; there is also an appointment scheduled with Dr. Alvy Bimler for this same day. Pending the discussion at the tumor board next week, one of [place] will evaluate him and provide him with next steps and recommendations. He agreed with this plan.  He asked questions about his recent lab  studies. I reviewed them with him over the phone today. Encouraged him to call me with additional questions or concerns.   [name], NP [place] [phone]

## 2016-03-13 ENCOUNTER — Telehealth: Payer: Self-pay | Admitting: *Deleted

## 2016-03-13 NOTE — Telephone Encounter (Signed)
Called pt to make sure he had p/u Rx for infection. Pt is taking Amoxicillin twice daily as prescribed without any problems. Pt says he is able to swallow the medication and denies any diarrhea. I told pt to make sure he complete taking all of the antibiotics. No other concerns at this time. Message to be fwd to G.Dawson,NP.

## 2016-03-17 ENCOUNTER — Encounter: Payer: Medicare Other | Admitting: Adult Health

## 2016-03-19 ENCOUNTER — Telehealth: Payer: Self-pay | Admitting: Adult Health

## 2016-03-19 NOTE — Telephone Encounter (Signed)
After H&N tumor board discussion this morning, it was recommended that the patient come in tomorrow to see Dr. Alvy Bimler, to discuss additional recommendations and treatment plan.  I was able to speak with [name], Mr. Dobler significant other. I let her know that I will cancel his appointment that was previously scheduled with me tomorrow morning. I instructed them to show up to the cancer center at 2:15 PM tomorrow afternoon to see Dr. Calton Dach for a 2:30 PM appointment. [name] voiced understanding and read back these instructions to me. I encouraged her to call me with any other questions or concerns.   [name], NP [place] [phone]

## 2016-03-20 ENCOUNTER — Encounter: Payer: Self-pay | Admitting: Hematology and Oncology

## 2016-03-20 ENCOUNTER — Encounter: Payer: Self-pay | Admitting: *Deleted

## 2016-03-20 ENCOUNTER — Ambulatory Visit (HOSPITAL_BASED_OUTPATIENT_CLINIC_OR_DEPARTMENT_OTHER): Payer: Medicare Other | Admitting: Hematology and Oncology

## 2016-03-20 ENCOUNTER — Telehealth: Payer: Self-pay | Admitting: Hematology and Oncology

## 2016-03-20 ENCOUNTER — Encounter: Payer: Medicare Other | Admitting: Adult Health

## 2016-03-20 ENCOUNTER — Telehealth: Payer: Self-pay | Admitting: *Deleted

## 2016-03-20 VITALS — BP 149/80 | HR 84 | Temp 98.0°F | Resp 16 | Ht 68.5 in | Wt 196.2 lb

## 2016-03-20 DIAGNOSIS — Z720 Tobacco use: Secondary | ICD-10-CM

## 2016-03-20 DIAGNOSIS — Z85118 Personal history of other malignant neoplasm of bronchus and lung: Secondary | ICD-10-CM | POA: Diagnosis not present

## 2016-03-20 DIAGNOSIS — D509 Iron deficiency anemia, unspecified: Secondary | ICD-10-CM

## 2016-03-20 DIAGNOSIS — F1010 Alcohol abuse, uncomplicated: Secondary | ICD-10-CM

## 2016-03-20 DIAGNOSIS — Z23 Encounter for immunization: Secondary | ICD-10-CM | POA: Diagnosis present

## 2016-03-20 DIAGNOSIS — Z85810 Personal history of malignant neoplasm of tongue: Secondary | ICD-10-CM

## 2016-03-20 DIAGNOSIS — R918 Other nonspecific abnormal finding of lung field: Secondary | ICD-10-CM | POA: Diagnosis not present

## 2016-03-20 DIAGNOSIS — C023 Malignant neoplasm of anterior two-thirds of tongue, part unspecified: Secondary | ICD-10-CM

## 2016-03-20 DIAGNOSIS — E039 Hypothyroidism, unspecified: Secondary | ICD-10-CM | POA: Insufficient documentation

## 2016-03-20 DIAGNOSIS — J449 Chronic obstructive pulmonary disease, unspecified: Secondary | ICD-10-CM | POA: Insufficient documentation

## 2016-03-20 DIAGNOSIS — I255 Ischemic cardiomyopathy: Secondary | ICD-10-CM

## 2016-03-20 DIAGNOSIS — F17200 Nicotine dependence, unspecified, uncomplicated: Secondary | ICD-10-CM

## 2016-03-20 MED ORDER — INFLUENZA VAC SPLIT QUAD 0.5 ML IM SUSY
0.5000 mL | PREFILLED_SYRINGE | Freq: Once | INTRAMUSCULAR | Status: AC
  Administered 2016-03-20: 0.5 mL via INTRAMUSCULAR
  Filled 2016-03-20: qty 0.5

## 2016-03-20 MED ORDER — PREDNISONE 20 MG PO TABS: 60.0000 mg | ORAL_TABLET | Freq: Every day | ORAL | 0 refills | Status: DC

## 2016-03-20 NOTE — Assessment & Plan Note (Addendum)
The patient have cough and abnormal imaging study. I recommend a trial of short course prednisone therapy. I recommend him to quit smoking We discussed the importance of preventive care and reviewed the vaccination programs. He does not have any prior allergic reactions to influenza vaccination. He agrees to proceed with influenza vaccination today and we will administer it today at the clinic.

## 2016-03-20 NOTE — Assessment & Plan Note (Signed)
He has significant alcohol intake. I recommend him to quit drinking due to risk of cancer recurrence

## 2016-03-20 NOTE — Telephone Encounter (Signed)
Gave patient avs report and appointments for December and January. [facility] will call re scan.  °

## 2016-03-20 NOTE — Assessment & Plan Note (Signed)
The patient has history of ischemic cardiomyopathy. He has shortness of breath on minimal exertion. He is not compliant taking his other medications and has not seen his primary care doctor for long time. I recommend him to quit smoking and make a return appointment to see his primary care doctor for medication refills

## 2016-03-20 NOTE — Assessment & Plan Note (Signed)
He complained of excessive fatigue. I will repeat TSH in his next visit. His last TSH a month ago was within normal limits

## 2016-03-20 NOTE — Assessment & Plan Note (Signed)
I reviewed the abnormal CT imaging with the patient and his wife. We have discussed his case at the ENT tumor board yesterday. The abnormal imaging study is concerning for metastatic cancer. Infection cannot be excluded. Ideally, I would refer most of these cases to pulmonologist for further evaluation. However, given his prior extensive neck surgery, the procedure might be technically difficult. Since he had prior lung resection, I will touch base with his cardiothoracic surgeon for his opinion. For now, he has completed a course of oral antibiotic therapy. He has persistent mild cough which I think be related to COPD and smoking and I plan to start him on a course of corticosteroid treatment. I will repeat imaging study with CT scan of the chest with contrast at the end of December and see him back in January for further evaluation. He agreed with the plan of care

## 2016-03-20 NOTE — Assessment & Plan Note (Signed)
I spent some time counseling the patient the importance of tobacco cessation. He is currently not interested to quit now.  

## 2016-03-20 NOTE — Telephone Encounter (Signed)
A user error has taken place: encounter opened in error, closed for administrative reasons.

## 2016-03-20 NOTE — Assessment & Plan Note (Signed)
He has evidence of microcytic anemia. He denies recent bleeding. There could be a component of anemia of chronic disease or iron deficiency. I will order additional workup next month but recommend oral iron supplement for now

## 2016-03-20 NOTE — Progress Notes (Signed)
[place] NOTE  Patient Care Team: Janith Lima, MD as PCP - General (Internal Medicine) Leota Sauers, RN as Registered Nurse  CHIEF COMPLAINTS/PURPOSE OF CONSULTATION:  History of tongue cancer, lung cancer, abnormal imaging study concern for recurrence of disease  HISTORY OF PRESENTING ILLNESS:  Trevor Griffin 60 y.o. male is here because of recent abnormal imaging study. He is present with his wife who is a kidney transplant recipient and his 77-year-old son He has background history of tongue cancer status post surgery and adjuvant radiation therapy. He also had history of enlarging lung nodule status post wedge resection. I review his records extensively and summarized as follows:   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[place])   09/16/2012 Pathologic Stage    pT3pN1      09/16/2012 Pathology Results    Right tongue--Invasive SCC, mod differentiated, tumor 4.4 cm, negative margins for invasive dz, dysplasia seen at [facility] mucosal margin, Grade 2, PNI+, LVI neg, 1 oral LN neg. Right neck LN dissection (zone 2 & 3)---1/21 LNs positive for metastatic [facility]      09/16/2012 Surgery    Tracheotomy, bilateral selective neck dissection, subtotal glossectomy with left radial forearm fasicocutaneous free flap reconstruction with split-thickness skin graft from left thigh ([place])       09/21/2012 Surgery    G-tube placement [facility])      09/23/2012 Procedure    Trach removal [facility])      10/15/2012 Imaging    Staging CT c/a/p: No acute process or evidence of metastatic disease in the chest, abd, or pelvis. Similar tiny RUL lung nodule since 2006, consistent with a benign etiology.       10/25/2012 - 12/10/2012 Radiation Therapy    IM[facility] Isidore Moos). Right Tongue/ Bilateral Neck /// Total Dose, 60 Gy in 30 Fractions      03/03/2013 Imaging    CT chest: Enlarging (and now cavitary) nodule in RLL measuring 1.3 cm (previous exam measured 4 mm). Suspicious for  SCC metastasis.       02/2013 Procedure    PEG removed       06/06/2013 Surgery    Right video-assisted thoracoscopy, wedge resection, thoracoscopic right lower lobectomy, lymph node dissection. Roxan Hockey)      06/06/2013 Pathology Results    Accession: [id number] pathology report from wedge resection of the lung confirmed squamous cell carcinoma of the lung      07/13/2014 Imaging    CT chest: s/p RUL lobectomy. No findings to suggest recurrent disease or new mets to thorax. Tiny 4 mm LUL nodule unchanged from 10/2012 and considered benign.        12/14/2014 Imaging    CT chest: Stable postoperative chest CT. No evidence of local reurrence or metastatic disease. Stable postsurgical scarring in right lung.       06/29/2015 Imaging    CXR: Patient evaluated for acute concerns regarding fatigue, cough, and dysphagia.         03/07/2016 Imaging    Ct chest showed new ill-defined nodular ground-glass throughout the right hemi thorax with some associated septal thickening, worrisome for lymphangitic carcinomatosis. An infectious etiology is not excluded. Aortic atherosclerosis (ICD10-170.0). Coronary artery calcification. Cholelithiasis.      He had recent cough and shortness of breath. Denies recent fever or chills. The patient continues to smoke cigarettes and drinking alcohol on a regular basis. The patient denies any recent signs or symptoms of bleeding such as spontaneous epistaxis, hematuria or hematochezia. He complained  of excessive fatigue. The patient has not seen his primary care doctor for a long time. When I questioned whether he is taking other medications consistently such as his Lipitor he stated he was taking them. When I ask him who refilled his prescriptions he could not answer for sure.  MEDICAL HISTORY:  Past Medical History:  Diagnosis Date  . Alcohol abuse   . Anxiety   . CAD (coronary artery disease)    a. Ant STEMI 5/08 with 4 Taxus DES proximal to mid LAD;   b. 06/2007:  Stopped Plavix->anterior STEMI/stent thrombosis in LAD (PTCA), LCX 72md;  c. 10/12: Cath/PCI: prox to mid LAD stent ok with 10-20% ISR, mLAD 40%, CFX 30-40%, mCFX 90% (DES), EF 35%;  d. 02/2014 MV: antsept infarct w/ mild peri-infarct ischemia->med rx. e. cath 06/21/2014 no significant CAD, patent stents, EF 40-45%  . Chronic pain   . COPD (chronic obstructive pulmonary disease) ([place]   . Depression   . Fatigue    sleep test neg 2008  . GERD (gastroesophageal reflux disease)   . Headache(784.0)   . Hx of radiation therapy 10/25/12-12/10/12    Right Tongue/ Bilateral Neck //Total Dose, 60 Gy in 30 Fractions  . Hyperlipidemia   . Hypertension   . Ischemic cardiomyopathy    a. Echo 08/20/11 with EF 50-55% (previously 35%);  b. 02/2014 Echo: EF 45-50-%, mid-apicalanteroseptal AK, Gr 1 dd, mild AI, midlly dil LA.  . Lung cancer ([place] dx'd 2015  . Obesity   . Palpitations    For event monitor 01/2012  . Sleep apnea    not on CPAP  . Tobacco abuse   . Tongue cancer [place] dx'd [place]    SURGICAL HISTORY: Past Surgical History:  Procedure Laterality Date  . CARDIAC CATHETERIZATION     ANTERIOR STEMI W/4  OVERLAPPING TAXUS STENTS IN THE PROXIMAL TO MID LAD  . CORONARY ANGIOPLASTY    . FRACTURE SURGERY Right    as a child,leg  . LEFT HEART CATHETERIZATION WITH CORONARY ANGIOGRAM N/A 06/21/2014   Procedure: LEFT HEART CATHETERIZATION WITH CORONARY ANGIOGRAM;  Surgeon: CBurnell Blanks MD;  [facility];  Service: Cardiovascular;  Laterality: N/A;  . LOBECTOMY Right 06/06/2013   Procedure: LOBECTOMY;  Surgeon: SMelrose Nakayama MD;  [place]  Service: Thoracic;  Laterality: Right;  .Marland KitchenVIDEO ASSISTED THORACOSCOPY (VATS)/WEDGE RESECTION Right 06/06/2013   Procedure: VIDEO ASSISTED THORACOSCOPY (VATS)/WEDGE RESECTION;  Surgeon: SMelrose Nakayama MD;  [facility]  Service: Thoracic;  Laterality: Right;    SOCIAL HISTORY: Social History   Social History   . Marital status: Married    Spouse name: N/A  . Number of children: N/A  . Years of education: N/A   Occupational History  . Not on file.   Social History Main Topics  . Smoking status: Current Every Day Smoker    Packs/day: 0.75    Years: 40.00    Types: Cigars  . Smokeless tobacco: Never Used     Comment: Stopped smoking cigars day before surgery, 06/09/14 smoking 1/2 pack cigars daily, 06/29/15; currently smokes 3/4 ppd of cigars (as of 02/14/16)-gwd  . Alcohol use 19.2 oz/week    32 Cans of beer per week     Comment: 8-12 BEERS DAILY prior to surgery. 8+ beers per day at present (02/14/16--gwd  . Drug use: No  . Sexual activity: Yes   Other Topics Concern  . Not on file   Social History Narrative   Alcoholic  beverage:  Yes      Drug use:  No      Seatbead Use: Yes      Firearms in home:  Yes      Exercise:  No      Smoke Alarm in your home: Yes                   FAMILY HISTORY: Family History  Problem Relation Age of Onset  . Kidney disease Mother     CHRONIC  . Cancer Sister   . Liver disease Sister     LIVER FAILURE    ALLERGIES:  is allergic to hydromorphone and nubain [nalbuphine hcl].  MEDICATIONS:  Current Outpatient Prescriptions  Medication Sig Dispense Refill  . acetaminophen (TYLENOL) 500 MG tablet Take 1,000 mg by mouth every 8 (eight) hours as needed (pain).    Marland Kitchen aspirin 81 MG tablet Take 1 tablet (81 mg total) by mouth daily. 30 tablet 1  . atorvastatin (LIPITOR) 80 MG tablet Take 1 tablet (80 mg total) by mouth daily. 30 tablet 6  . buPROPion (WELLBUTRIN SR) 150 MG 12 hr tablet Take 1 tablet (150 mg total) by mouth 2 (two) times daily. 60 tablet 1  . levothyroxine (SYNTHROID) 125 MCG tablet Take 1 tablet (125 mcg total) by mouth daily before breakfast. 90 tablet 0  . meloxicam (MOBIC) 7.5 MG tablet Take 1 tablet (7.5 mg total) by mouth daily. 90 tablet 0  . metoprolol tartrate (LOPRESSOR) 25 MG tablet Take 0.5 tablets (12.5 mg total)  by mouth 2 (two) times daily. 30 tablet 6  . Multiple Vitamins-Minerals (MULTIVITAMIN WITH MINERALS) tablet Take 1 tablet by mouth daily.      . nitroGLYCERIN (NITROSTAT) 0.4 MG SL tablet Place 1 tablet (0.4 mg total) under the tongue every 5 (five) minutes as needed (up to 3 doses). For chest pain 25 tablet 3  . omeprazole (PRILOSEC) 20 MG capsule Take 1 capsule (20 mg total) by mouth daily. 30 capsule 0  . predniSONE (DELTASONE) 20 MG tablet Take 3 tablets (60 mg total) by mouth daily with breakfast. Take 3 tabs for 5 days, then 2 tabs for 5 days, 1 tab for 5 days and then stop 30 tablet 0  . traMADol (ULTRAM) 50 MG tablet Take 50 mg by mouth every 6 (six) hours as needed. For pain     No current facility-administered medications for this visit.     REVIEW OF SYSTEMS:   Constitutional: Denies fevers, chills or abnormal night sweats Eyes: Denies blurriness of vision, double vision or watery eyes Ears, nose, mouth, throat, and face: Denies mucositis or sore throat Cardiovascular: Denies palpitation, chest discomfort or lower extremity swelling Gastrointestinal:  Denies nausea, heartburn or change in bowel habits Skin: Denies abnormal skin rashes Lymphatics: Denies new lymphadenopathy or easy bruising Neurological:Denies numbness, tingling or new weaknesses Behavioral/Psych: Mood is stable, no new changes  All other systems were reviewed with the patient and are negative.  PHYSICAL EXAMINATION: ECOG PERFORMANCE STATUS: 1 - Symptomatic but completely ambulatory  Vitals:   03/20/16 1406  BP: (!) 149/80  Pulse: 84  Resp: 16  Temp: 98 F (36.7 C)   Filed Weights   03/20/16 1406  Weight: 196 lb 3.2 oz (89 kg)    GENERAL:alert, no distress and comfortable SKIN: skin color, texture, turgor are normal, no rashes or significant lesions EYES: normal, conjunctiva are pink and non-injected, sclera clear OROPHARYNX:no exudate, no erythema and lips, buccal mucosa, and tongue normal  NECK:  Noted surgical scar on his neck with minor lymphedema from prior treatment LYMPH:  no palpable lymphadenopathy in the cervical, axillary or inguinal LUNGS: Diffuse scattered wheezes, right greater than left, with normal breathing effort HEART: regular rate & rhythm and no murmurs and no lower extremity edema ABDOMEN:abdomen soft, non-tender and normal bowel sounds Musculoskeletal:no cyanosis of digits and no clubbing  PSYCH: alert & oriented x 3 with fluent speech NEURO: no focal motor/sensory deficits  LABORATORY DATA:  I have reviewed the data as listed Lab Results  Component Value Date   WBC 4.9 03/07/2016   HGB 10.7 (L) 03/07/2016   HCT 34.5 (L) 03/07/2016   MCV 77.8 (L) 03/07/2016   PLT 240 03/07/2016    Recent Labs  06/29/15 1100 08/06/15 1009 11/28/15 2040 02/14/16 1217  NA 134*  --  136 133*  K 4.3  --  4.4 4.2  CL  --   --  101  --   CO2 27  --  26 24  GLUCOSE 90  --  93 112  BUN 12.3 11.9 6 10.7  CREATININE 0.9 0.9 0.89 0.9  CALCIUM 9.2  --  8.9 9.1  GFRNONAA  --   --  >60  --   GFRAA  --   --  >60  --   PROT 7.7  --   --  7.2  ALBUMIN 3.7  --   --  3.6  AST 15  --   --  15  ALT 12  --   --  13  ALKPHOS 92  --   --  103  BILITOT 0.46  --   --  0.41    RADIOGRAPHIC STUDIES:I reviewed imaging studies with him and his wife I have personally reviewed the radiological images as listed and agreed with the findings in the report. Ct Soft Tissue Neck W Contrast  Result Date: 03/07/2016 CLINICAL DATA:  New submandibular mass. History of right oral tongue squamous cell carcinoma. EXAM: CT NECK WITH CONTRAST TECHNIQUE: Multidetector CT imaging of the neck was performed using the standard protocol following the bolus administration of intravenous contrast. CONTRAST:  69m ISOVUE-300 IOPAMIDOL (ISOVUE-300) INJECTION 61% COMPARISON:  08/09/2013 FINDINGS: Pharynx and larynx: Sequelae of partial glossectomy are again identified with flap reconstruction. Surgical clips are  again seen associated with the oral tongue and right submandibular space. No recurrent local mass is identified. Pharyngeal mucosal edema on the prior study has decreased. Larynx is unchanged in appearance. Trace fluid/ stranding in the right carotid and retropharyngeal spaces, similar to prior. Salivary glands: No parotid mass. Absent right submandibular gland. Atrophic or absent left submandibular gland. Thyroid: Unchanged, small right thyroid lobe calcification. Lymph nodes: No lymph nodes identified in the neck which are enlarged by CT criteria. 6 mm lymph node adjacent to the right parotid tail (Series 5, image 45, previously 3 mm). Vascular: Major vascular structures of the neck appear patent. Aortic atherosclerosis. Carotid bifurcation atherosclerosis without evidence of significant stenosis. Limited intracranial: Unremarkable. Visualized orbits: Unremarkable. Mastoids and visualized paranasal sinuses: Unchanged left maxillary sinus mucous retention cyst. Chronic bilateral mastoid effusions, left larger than right. Skeleton: No acute abnormality or suspicious lytic or blastic osseous lesion identified. Upper chest: Evaluated on separate dedicated chest CT. Other: None. IMPRESSION: 1. Stable postsurgical changes without evidence of submandibular mass or other local recurrence. 2. No definite evidence of recurrent disease elsewhere in the neck. A 6 mm right periparotid lymph node is larger than in 2015 but favored to be  benign. 3. Aortic atherosclerosis. Electronically Signed   By: Logan Bores M.D.   On: 03/07/2016 11:01   Ct Chest W Contrast  Result Date: 03/07/2016 CLINICAL DATA:  Restaging throat cancer.  Right lung cancer. EXAM: CT CHEST WITH CONTRAST TECHNIQUE: Multidetector CT imaging of the chest was performed during intravenous contrast administration. CONTRAST:  67m ISOVUE-300 IOPAMIDOL (ISOVUE-300) INJECTION 61% COMPARISON:  CT neck 03/07/2016 and CT chest 08/06/2015. FINDINGS: Cardiovascular:  Atherosclerotic calcification of the arterial vasculature, including coronary arteries. Heart size normal. No pericardial effusion. Mediastinum/Nodes: No pathologically enlarged mediastinal, hilar or axillary lymph nodes. Esophagus is grossly unremarkable. Lungs/Pleura: New ill-defined nodular ground-glass throughout the right hemi thorax with some associated septal thickening. Right lower lobectomy. Left lung is clear. No pleural fluid. Airway is otherwise unremarkable. Upper Abdomen: Visualized portion of the liver is unremarkable. Stones are seen in the gallbladder. Visualized portions of the adrenal glands, right kidney, spleen, pancreas, stomach and bowel are grossly unremarkable. No upper abdominal adenopathy. Musculoskeletal: No worrisome lytic or sclerotic lesions. IMPRESSION: 1. New ill-defined nodular ground-glass throughout the right hemi thorax with some associated septal thickening, worrisome for lymphangitic carcinomatosis. An infectious etiology is not excluded. 2. Aortic atherosclerosis (ICD10-170.0). Coronary artery calcification. 3. Cholelithiasis. 4. CT neck dictated separately. Electronically Signed   By: MLorin PicketM.D.   On: 03/07/2016 12:05    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[place] Recent CT scan of the neck show no evidence of local disease. For this, I recommend close ENT follow-up Please refer to the next problem related to his abnormal CT scan of the lung  Microcytic anemia He has evidence of microcytic anemia. He denies recent bleeding. There could be a component of anemia of chronic disease or iron deficiency. I will order additional workup next month but recommend oral iron supplement for now  Cardiomyopathy, ischemic-EF 50% by 2D Oct 2015 The patient has history of ischemic cardiomyopathy. He has shortness of breath on minimal exertion. He is not compliant taking his other medications and has not seen his primary care doctor for long  time. I recommend him to quit smoking and make a return appointment to see his primary care doctor for medication refills  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) ([place] The patient have cough and abnormal imaging study. I recommend a trial of short course prednisone therapy. I recommend him to quit smoking  Right lower lobe lung mass I reviewed the abnormal CT imaging with the patient and his wife. We have discussed his case at the ENT tumor board yesterday. The abnormal imaging study is concerning for metastatic cancer. Infection cannot be excluded. Ideally, I would refer most of these cases to pulmonologist for further evaluation. However, given his prior extensive neck surgery, the procedure might be technically difficult. Since he had prior lung resection, I will touch base with his cardiothoracic surgeon for his opinion. For now, he has completed a course of oral antibiotic therapy. He has persistent mild cough which I think be related to COPD and smoking and I plan to start him on a course of corticosteroid treatment. I will repeat imaging study with CT scan of the chest with contrast at the end of December and see him back in January for further evaluation. He agreed with the plan of care   Acquired hypothyroidism He complained of excessive fatigue. I will repeat TSH in his next visit. His last TSH a month ago was within normal limits  Alcohol abuse He  has significant alcohol intake. I recommend him to quit drinking due to risk of cancer recurrence  SMOKER I spent some time counseling the patient the importance of tobacco cessation. He is currently not interested to quit now.   Orders Placed This Encounter  Procedures  . CT CHEST W CONTRAST    Standing Status:   Future    Standing Expiration Date:   04/24/2017    Order Specific Question:   Reason for exam:    Answer:   staging lung cancer, assess for progression    Order Specific Question:   Preferred  imaging location?    Answer:   [facility]  . CBC with Differential/Platelet    Standing Status:   Future    Standing Expiration Date:   04/24/2017  . Ferritin    Standing Status:   Future    Standing Expiration Date:   04/24/2017  . Comprehensive metabolic panel    Standing Status:   Future    Standing Expiration Date:   04/24/2017  . Iron and TIBC    Standing Status:   Future    Standing Expiration Date:   04/24/2017  . TSH    Standing Status:   Future    Standing Expiration Date:   04/24/2017    All questions were answered. The patient knows to call the clinic with any problems, questions or concerns. I spent 55 minutes counseling the patient face to face. The total time spent in the appointment was 80 minutes and more than 50% was on counseling.     Heath Lark, MD 03/20/2016 4:04 PM

## 2016-03-20 NOTE — Assessment & Plan Note (Addendum)
Recent CT scan of the neck show no evidence of local disease. For this, I recommend close ENT follow-up Please refer to the next problem related to his abnormal CT scan of the lung

## 2016-03-20 NOTE — Progress Notes (Signed)
Oncology Nurse Navigator Documentation  Met with Trevor Griffin during [place] Patient appt with Dr. Alvy Bimler.  He was accompanied by his wife and young son. They voiced understanding of Dr. Calton Dach discussion:  Recent CT Chest findings, repeat CT will be conducted in 8 weeks to gauge changes.  Coarse of prednisone to address probable lung inflammation seen in CT.  Follow-up with Dr. Alvy Bimler following next CT chest. They understand I can be contacted with needs/concerns.  [name], RN, BSN, [facility] [place] [phone]

## 2016-03-20 NOTE — Patient Instructions (Signed)
Preventing Influenza, Adult Influenza, more commonly known as "the flu," is a viral infection that mainly affects the respiratory tract. The respiratory tract includes structures that help you breathe, such as the lungs, nose, and throat. The flu causes many common cold symptoms, as well as a high fever and body aches. The flu spreads easily from person to person (is contagious). The flu is most common from December through March. This is called flu season.You can catch the flu virus by:  Breathing in droplets from an infected person's cough or sneeze.  Touching something that was recently contaminated with the virus and then touching your mouth, nose, or eyes. What can I do to lower my risk? You can decrease your risk of getting the flu by:  Getting a flu shot (influenza vaccination) every year. This is the best way to prevent the flu. A flu shot is recommended for everyone age 68 months and older.  It is best to get a flu shot in the fall, as soon as it is available. Getting a flu shot during winter or spring instead is still a good idea. Flu season can last into early spring.  Preventing the flu through vaccination requires getting a new flu shot every year. This is because the flu virus changes slightly (mutates) from one year to the next. Even if a flu shot does not completely protect you from all flu virus mutations, it can reduce the severity of your illness and prevent dangerous complications of the flu.  If you are pregnant, you can and should get a flu shot.  If you have had a reaction to the shot in the past or if you are allergic to eggs, check with your health care provider before getting a flu shot.  Sometimes the vaccine is available as a nasal spray. In some years, the nasal spray has not been as effective against the flu virus. Check with your health care provider if you have questions about this.  Practicing good health habits. This is especially important during flu  season.  Avoid contact with people who are sick with flu or cold symptoms.  Wash your hands with soap and water often. If soap and water are not available, use hand sanitizer.  Avoid touching your hands to your face, especially when you have not washed your hands recently.  Use a disinfectant to clean surfaces at home and at work that may be contaminated with the flu virus.  Keep your body's disease-fighting system (immune system) in good shape by eating a healthy diet, drinking plenty of fluids, getting enough sleep, and exercising regularly. If you do get the flu, avoid spreading it to others by:  Staying home until your symptoms have been gone for at least one day.  Covering your mouth and nose with your elbow when you cough or sneeze.  Avoiding close contact with others, especially babies and elderly people. Why are these changes important? Getting a flu shot and practicing good health habits protects you as well as other people. If you get the flu, your friends, family, and co-workers are also at risk of getting it, because it spreads so easily to others. Each year, about 2 out of every 10 people get the flu. Having the flu can lead to complications, such as pneumonia, ear infection, and sinus infection. The flu also can be deadly, especially for babies, people older than age 75, and people who have serious long-term diseases. How is this treated? Most people recover from the flu  by resting at home and drinking plenty of fluids. However, a prescription antiviral medicine may reduce your flu symptoms and may make your flu go away sooner. This medicine must be started within a few days of getting flu symptoms. You can talk with your health care provider about whether you need an antiviral medicine. Antiviral medicine may be prescribed for people who are at risk for more serious flu symptoms. This includes people who:  Are older than age 55.  Are pregnant.  Have a condition that makes  the flu worse or more dangerous. Where to find more information:  Centers for Disease Control and Prevention: http://www.smith-bell.org/  [employer].ee: azureicus.com  American Academy of Family Physicians: familydoctor.org/familydoctor/en/kids/vaccines/preventing-the-flu.html Contact a health care provider if:  You have influenza and you develop new symptoms.  You have:  Chest pain.  Diarrhea.  A fever.  Your cough gets worse, or you produce more mucus. Summary  The best way to prevent the flu is to get a flu shot every year in the fall.  Even if you get the flu after you have received the yearly vaccine, your flu may be milder and go away sooner because of your flu shot.  If you get the flu, antiviral medicines that are started with a few days of symptoms may reduce your flu symptoms and may make your flu go away sooner.  You can also help prevent the flu by practicing good health habits. This information is not intended to replace advice given to you by your health care provider. Make sure you discuss any questions you have with your health care provider. Document Released: 05/06/2015 Document Revised: 12/29/2015 Document Reviewed: 12/29/2015 Elsevier Interactive Patient Education  2017 [employer].

## 2016-03-31 ENCOUNTER — Ambulatory Visit: Payer: Medicare Other | Admitting: Thoracic Surgery (Cardiothoracic Vascular Surgery)

## 2016-04-08 ENCOUNTER — Ambulatory Visit: Payer: Medicare Other | Admitting: Thoracic Surgery (Cardiothoracic Vascular Surgery)

## 2016-04-17 ENCOUNTER — Ambulatory Visit: Payer: Medicare Other | Admitting: Thoracic Surgery (Cardiothoracic Vascular Surgery)

## 2016-04-18 ENCOUNTER — Ambulatory Visit: Payer: Medicare Other | Admitting: Thoracic Surgery (Cardiothoracic Vascular Surgery)

## 2016-04-30 ENCOUNTER — Ambulatory Visit: Payer: Medicare Other | Admitting: Thoracic Surgery (Cardiothoracic Vascular Surgery)

## 2016-05-02 ENCOUNTER — Ambulatory Visit (HOSPITAL_COMMUNITY): Payer: Medicare Other

## 2016-05-02 ENCOUNTER — Telehealth: Payer: Self-pay | Admitting: Hematology and Oncology

## 2016-05-02 ENCOUNTER — Other Ambulatory Visit: Payer: Medicare Other

## 2016-05-02 NOTE — Telephone Encounter (Signed)
Lab appointment rescheduled per rescheduled scan appointment.

## 2016-05-02 NOTE — Telephone Encounter (Signed)
Patient's wife called to cancel lab appointment. Will reschedule according to scan appointment date.

## 2016-05-06 ENCOUNTER — Ambulatory Visit: Payer: Medicare Other | Admitting: Thoracic Surgery (Cardiothoracic Vascular Surgery)

## 2016-05-08 ENCOUNTER — Other Ambulatory Visit (HOSPITAL_BASED_OUTPATIENT_CLINIC_OR_DEPARTMENT_OTHER): Payer: Medicare Other

## 2016-05-08 ENCOUNTER — Encounter (HOSPITAL_COMMUNITY): Payer: Self-pay

## 2016-05-08 ENCOUNTER — Ambulatory Visit (HOSPITAL_COMMUNITY)
Admission: RE | Admit: 2016-05-08 | Discharge: 2016-05-08 | Disposition: A | Discharge status: Home / Self Care | Payer: Medicare Other | Source: Ambulatory Visit | Attending: Hematology and Oncology | Admitting: Hematology and Oncology

## 2016-05-08 ENCOUNTER — Ambulatory Visit: Payer: Medicare Other | Admitting: Hematology and Oncology

## 2016-05-08 DIAGNOSIS — E039 Hypothyroidism, unspecified: Secondary | ICD-10-CM

## 2016-05-08 DIAGNOSIS — C023 Malignant neoplasm of anterior two-thirds of tongue, part unspecified: Secondary | ICD-10-CM | POA: Diagnosis not present

## 2016-05-08 DIAGNOSIS — D509 Iron deficiency anemia, unspecified: Secondary | ICD-10-CM | POA: Diagnosis not present

## 2016-05-08 DIAGNOSIS — J449 Chronic obstructive pulmonary disease, unspecified: Secondary | ICD-10-CM | POA: Insufficient documentation

## 2016-05-08 DIAGNOSIS — Z85118 Personal history of other malignant neoplasm of bronchus and lung: Secondary | ICD-10-CM | POA: Diagnosis not present

## 2016-05-08 LAB — CBC WITH DIFFERENTIAL/PLATELET
BASO%: 1.2 % (ref 0.0–2.0)
Basophils Absolute: 0.1 10*3/uL (ref 0.0–0.1)
EOS%: 1.2 % (ref 0.0–7.0)
Eosinophils Absolute: 0 10*3/uL (ref 0.0–0.5)
HEMATOCRIT: 33.5 % — AB (ref 38.4–49.9)
HEMOGLOBIN: 10.6 g/dL — AB (ref 13.0–17.1)
LYMPH#: 0.7 10*3/uL — AB (ref 0.9–3.3)
LYMPH%: 17.8 % (ref 14.0–49.0)
MCH: 24.4 pg — ABNORMAL LOW (ref 27.2–33.4)
MCHC: 31.8 g/dL — ABNORMAL LOW (ref 32.0–36.0)
MCV: 76.6 fL — ABNORMAL LOW (ref 79.3–98.0)
MONO#: 0.6 10*3/uL (ref 0.1–0.9)
MONO%: 14.2 % — ABNORMAL HIGH (ref 0.0–14.0)
NEUT#: 2.7 10*3/uL (ref 1.5–6.5)
NEUT%: 65.6 % (ref 39.0–75.0)
Platelets: 273 10*3/uL (ref 140–400)
RBC: 4.37 10*6/uL (ref 4.20–5.82)
RDW: 18.7 % — AB (ref 11.0–14.6)
WBC: 4.1 10*3/uL (ref 4.0–10.3)

## 2016-05-08 LAB — COMPREHENSIVE METABOLIC PANEL
ALBUMIN: 3.8 g/dL (ref 3.5–5.0)
ALK PHOS: 114 U/L (ref 40–150)
ALT: 10 U/L (ref 0–55)
AST: 13 U/L (ref 5–34)
Anion Gap: 9 mEq/L (ref 3–11)
BILIRUBIN TOTAL: 0.56 mg/dL (ref 0.20–1.20)
BUN: 10.1 mg/dL (ref 7.0–26.0)
CO2: 24 mEq/L (ref 22–29)
CREATININE: 0.8 mg/dL (ref 0.7–1.3)
Calcium: 9.6 mg/dL (ref 8.4–10.4)
Chloride: 102 mEq/L (ref 98–109)
EGFR: >90 mL/min/{1.73_m2} (ref >90)
Glucose: 85 mg/dl (ref 70–140)
Potassium: 4 mEq/L (ref 3.5–5.1)
Sodium: 134 mEq/L — ABNORMAL LOW (ref 136–145)
TOTAL PROTEIN: 7.7 g/dL (ref 6.4–8.3)

## 2016-05-08 LAB — IRON AND TIBC
%SAT: 8 % — AB (ref 20–55)
Iron: 36 ug/dL — ABNORMAL LOW (ref 42–163)
TIBC: 445 ug/dL — ABNORMAL HIGH (ref 202–409)
UIBC: 409 ug/dL — ABNORMAL HIGH (ref 117–376)

## 2016-05-08 LAB — TSH: TSH: 12.093 m(IU)/L — ABNORMAL HIGH (ref 0.320–4.118)

## 2016-05-08 LAB — FERRITIN: Ferritin: 15 ng/ml — ABNORMAL LOW (ref 22–316)

## 2016-05-08 MED ORDER — IOPAMIDOL (ISOVUE-300) INJECTION 61%
INTRAVENOUS | Status: AC
  Filled 2016-05-08: qty 75

## 2016-05-08 MED ORDER — IOPAMIDOL (ISOVUE-300) INJECTION 61%
75.0000 mL | Freq: Once | INTRAVENOUS | Status: AC | PRN
  Administered 2016-05-08: 75 mL via INTRAVENOUS

## 2016-05-12 ENCOUNTER — Ambulatory Visit: Payer: Medicare Other | Admitting: Hematology and Oncology

## 2016-05-12 ENCOUNTER — Telehealth: Payer: Self-pay | Admitting: Hematology and Oncology

## 2016-05-12 NOTE — Telephone Encounter (Signed)
PT WIFE CALLED TO R/S 1/8 APPT DUE TO HER NOT FEELING WELL AND UNABLE TO BRING HIM. PT WIFE HAS NEW APPT DATE/TIME

## 2016-05-15 ENCOUNTER — Ambulatory Visit: Payer: Medicare Other | Admitting: Hematology and Oncology

## 2016-05-15 ENCOUNTER — Telehealth: Payer: Self-pay | Admitting: *Deleted

## 2016-05-15 NOTE — Telephone Encounter (Signed)
Overall CT scan is better but there are many other things to discuss and I would rather reschedule his appt I can see them Monday 1/22 at 2 pm, 30 mins If this is acceptable, please place scheduling msg

## 2016-05-15 NOTE — Telephone Encounter (Signed)
Informed wife of Dr. Calton Dach message and will r/s appt to 1/22 at 2 pm.  She verbalized understanding.  Scheduling message sent.

## 2016-05-15 NOTE — Telephone Encounter (Signed)
Wife says pt has the flu and unable to make appt today.   She herself is just now recovering from the flu and now pt has it.  She asks if results can be called to them or if pt can reschedule his appt today to next week?

## 2016-05-26 ENCOUNTER — Ambulatory Visit: Payer: Medicare Other | Admitting: Hematology and Oncology

## 2016-05-26 ENCOUNTER — Telehealth: Payer: Self-pay | Admitting: Hematology and Oncology

## 2016-05-26 NOTE — Telephone Encounter (Signed)
Pt wife called to r/s pt appt to an a.m date/time. Gave pt wife new appt date 2/12 at 9 am per request

## 2016-06-16 ENCOUNTER — Encounter: Payer: Self-pay | Admitting: Hematology and Oncology

## 2016-06-16 ENCOUNTER — Ambulatory Visit: Payer: Medicare Other | Admitting: Hematology and Oncology

## 2016-10-13 ENCOUNTER — Telehealth: Payer: Self-pay

## 2016-10-13 ENCOUNTER — Ambulatory Visit: Payer: Medicare Other

## 2016-10-13 ENCOUNTER — Ambulatory Visit (HOSPITAL_BASED_OUTPATIENT_CLINIC_OR_DEPARTMENT_OTHER): Payer: Medicare Other | Admitting: Hematology and Oncology

## 2016-10-13 ENCOUNTER — Telehealth: Payer: Self-pay | Admitting: Hematology and Oncology

## 2016-10-13 ENCOUNTER — Encounter: Payer: Self-pay | Admitting: Hematology and Oncology

## 2016-10-13 VITALS — BP 154/80 | HR 96 | Temp 97.8°F | Resp 18 | Ht 68.5 in | Wt 198.0 lb

## 2016-10-13 DIAGNOSIS — D509 Iron deficiency anemia, unspecified: Secondary | ICD-10-CM | POA: Diagnosis not present

## 2016-10-13 DIAGNOSIS — E039 Hypothyroidism, unspecified: Secondary | ICD-10-CM | POA: Diagnosis not present

## 2016-10-13 DIAGNOSIS — C023 Malignant neoplasm of anterior two-thirds of tongue, part unspecified: Secondary | ICD-10-CM

## 2016-10-13 DIAGNOSIS — B370 Candidal stomatitis: Secondary | ICD-10-CM | POA: Diagnosis not present

## 2016-10-13 DIAGNOSIS — F17200 Nicotine dependence, unspecified, uncomplicated: Secondary | ICD-10-CM

## 2016-10-13 DIAGNOSIS — Z720 Tobacco use: Secondary | ICD-10-CM

## 2016-10-13 DIAGNOSIS — R918 Other nonspecific abnormal finding of lung field: Secondary | ICD-10-CM

## 2016-10-13 LAB — COMPREHENSIVE METABOLIC PANEL
ALT: 11 U/L (ref 0–55)
AST: 16 U/L (ref 5–34)
Albumin: 3.9 g/dL (ref 3.5–5.0)
Alkaline Phosphatase: 88 U/L (ref 40–150)
Anion Gap: 11 mEq/L (ref 3–11)
BILIRUBIN TOTAL: 0.54 mg/dL (ref 0.20–1.20)
BUN: 9.4 mg/dL (ref 7.0–26.0)
CO2: 26 meq/L (ref 22–29)
Calcium: 9.5 mg/dL (ref 8.4–10.4)
Chloride: 99 mEq/L (ref 98–109)
Creatinine: 1.1 mg/dL (ref 0.7–1.3)
EGFR: 76 mL/min/{1.73_m2} — AB (ref >90)
GLUCOSE: 90 mg/dL (ref 70–140)
Potassium: 4 mEq/L (ref 3.5–5.1)
SODIUM: 136 meq/L (ref 136–145)
TOTAL PROTEIN: 7.6 g/dL (ref 6.4–8.3)

## 2016-10-13 LAB — CBC WITH DIFFERENTIAL/PLATELET
BASO%: 1 % (ref 0.0–2.0)
Basophils Absolute: 0.1 10*3/uL (ref 0.0–0.1)
EOS ABS: 0.1 10*3/uL (ref 0.0–0.5)
EOS%: 1 % (ref 0.0–7.0)
HCT: 35.5 % — ABNORMAL LOW (ref 38.4–49.9)
HGB: 11 g/dL — ABNORMAL LOW (ref 13.0–17.1)
LYMPH%: 20.7 % (ref 14.0–49.0)
MCH: 25.7 pg — ABNORMAL LOW (ref 27.2–33.4)
MCHC: 31 g/dL — AB (ref 32.0–36.0)
MCV: 82.9 fL (ref 79.3–98.0)
MONO#: 0.6 10*3/uL (ref 0.1–0.9)
MONO%: 11.2 % (ref 0.0–14.0)
NEUT%: 66.1 % (ref 39.0–75.0)
NEUTROS ABS: 3.3 10*3/uL (ref 1.5–6.5)
Platelets: 153 10*3/uL (ref 140–400)
RBC: 4.28 10*6/uL (ref 4.20–5.82)
RDW: 16.9 % — ABNORMAL HIGH (ref 11.0–14.6)
WBC: 4.9 10*3/uL (ref 4.0–10.3)
lymph#: 1 10*3/uL (ref 0.9–3.3)

## 2016-10-13 MED ORDER — FLUCONAZOLE 100 MG PO TABS: 100.0000 mg | ORAL_TABLET | Freq: Every day | ORAL | 0 refills | Status: DC

## 2016-10-13 NOTE — Assessment & Plan Note (Signed)
His last blood work confirmed iron deficiency anemia He is fatigued He is on chronic aspirin therapy I recommend repeat blood work today

## 2016-10-13 NOTE — Progress Notes (Signed)
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 Right neck LN dissection (zone 2 & 3)---1/21 LNs positive for metastatic [facility]      09/16/2012 Surgery    Tracheotomy, bilateral selective neck dissection, subtotal glossectomy with left radial forearm fasicocutaneous free flap reconstruction with split-thickness skin graft from left thigh ([facility])       09/21/2012 Surgery    G-tube placement [facility])      09/23/2012 Procedure    Trach removal [facility])      10/15/2012 Imaging    Staging CT c/a/p: No acute process or evidence of metastatic disease in the chest, abd, or pelvis. Similar tiny RUL lung nodule since 2006, consistent with a benign etiology.       10/25/2012 - 12/10/2012 Radiation Therapy    IM[facility] Isidore Moos). Right Tongue/ Bilateral Neck /// Total Dose, 60 Gy in 30 Fractions      03/03/2013 Imaging    CT chest: Enlarging (and now cavitary) nodule in RLL measuring 1.3 cm (previous exam measured 4 mm). Suspicious for SCC metastasis.       02/2013 Procedure    PEG removed       05/09/2013 Imaging    CT Chest showed further increase in size of cavitary pulmonary nodule in the posterior right lower lobe now measuring 19 mm, with adjacent ill-defined nodular opacity possibly due to associated postobstructive atelectasis or pneumonitis. 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 Roxan Hockey)      06/06/2013 Pathology Results    Accession: [id number] pathology report from wedge resection of the lung confirmed squamous cell carcinoma of the lung      08/09/2013 Imaging    CT Chest showed interval development of multiple ground-glass attenuating nodules within the left lower lobe which are likely the sequelae of inflammation, infection or aspiration. Short-term followup imaging in 3 months is recommended to ensure resolution. 2. Status post right lower lobectomy. 3. Small right pleural effusion      11/15/2013 Imaging    Postoperative changes of right lower lobectomy. Interval resolution of the left lower lobe ground-glass opacity nodules. Small right pleural effusion persists.      07/13/2014 Imaging    CT chest: s/p RUL lobectomy. No findings to suggest recurrent disease or new mets to thorax. Tiny 4 mm LUL nodule unchanged from 10/2012 and considered benign.        12/14/2014 Imaging    CT chest: Stable postoperative chest CT. No evidence of local reurrence or metastatic disease. Stable postsurgical scarring in right lung.       06/29/2015 Imaging    CXR: Patient evaluated for acute concerns regarding fatigue, cough, and dysphagia.         03/07/2016 Imaging    Ct chest showed new ill-defined nodular ground-glass throughout the right hemi thorax with some associated septal thickening, worrisome for lymphangitic carcinomatosis. An infectious etiology is  not excluded. Aortic atherosclerosis (ICD10-170.0). Coronary artery calcification. Cholelithiasis.      05/08/2016 Imaging    Ct chest showed interval resolution of the infectious or inflammatory process in the right lung. Minimal residual tiny airspace nodules but no infiltrates or effusions.       INTERVAL HISTORY: Please see below for problem oriented charting. He returns with his wife today The patient has not been seen since January due to multiple family issues He denies significant  pain He had mild dysphagia recently He continues to smoke He denies recent cough He complained of excessive fatigue  REVIEW OF SYSTEMS:   Constitutional: Denies fevers, chills or abnormal weight loss Eyes: Denies blurriness of vision Ears, nose, mouth, throat, and face: Denies mucositis or sore throat Respiratory: Denies cough, dyspnea or wheezes Cardiovascular: Denies palpitation, chest discomfort or lower extremity swelling Gastrointestinal:  Denies nausea, heartburn or change in bowel habits Skin: Denies abnormal skin rashes Lymphatics: Denies new lymphadenopathy or easy bruising Neurological:Denies numbness, tingling or new weaknesses Behavioral/Psych: Mood is stable, no new changes  All other systems were reviewed with the patient and are negative.  I have reviewed the past medical history, past surgical history, social history and family history with the patient and they are unchanged from previous note.  ALLERGIES:  is allergic to hydromorphone and nubain [nalbuphine hcl].  MEDICATIONS:  Current Outpatient Prescriptions  Medication Sig Dispense Refill  . acetaminophen (TYLENOL) 500 MG tablet Take 1,000 mg by mouth every 8 (eight) hours as needed (pain).    Marland Kitchen aspirin 81 MG tablet Take 1 tablet (81 mg total) by mouth daily. 30 tablet 1  . atorvastatin (LIPITOR) 80 MG tablet Take 1 tablet (80 mg total) by mouth daily. 30 tablet 6  . buPROPion (WELLBUTRIN SR) 150 MG 12 hr tablet Take 1 tablet (150 mg total) by mouth 2 (two) times daily. 60 tablet 1  . fluconazole (DIFLUCAN) 100 MG tablet Take 1 tablet (100 mg total) by mouth daily. 7 tablet 0  . levothyroxine (SYNTHROID) 125 MCG tablet Take 1 tablet (125 mcg total) by mouth daily before breakfast. 90 tablet 0  . meloxicam (MOBIC) 7.5 MG tablet Take 1 tablet (7.5 mg total) by mouth daily. 90 tablet 0  . metoprolol tartrate (LOPRESSOR) 25 MG tablet Take 0.5 tablets (12.5 mg total) by mouth 2 (two) times daily. 30 tablet 6  .  Multiple Vitamins-Minerals (MULTIVITAMIN WITH MINERALS) tablet Take 1 tablet by mouth daily.      . nitroGLYCERIN (NITROSTAT) 0.4 MG SL tablet Place 1 tablet (0.4 mg total) under the tongue every 5 (five) minutes as needed (up to 3 doses). For chest pain 25 tablet 3  . omeprazole (PRILOSEC) 20 MG capsule Take 1 capsule (20 mg total) by mouth daily. 30 capsule 0   No current facility-administered medications for this visit.     PHYSICAL EXAMINATION: ECOG PERFORMANCE STATUS: 1 - Symptomatic but completely ambulatory  Vitals:   10/13/16 1519  BP: (!) 154/80  Pulse: 96  Resp: 18  Temp: 97.8 F (36.6 C)   Filed Weights   10/13/16 1519  Weight: 198 lb (89.8 kg)    GENERAL:alert, no distress and comfortable SKIN: skin color, texture, turgor are normal, no rashes or significant lesions EYES: normal, Conjunctiva are pink and non-injected, sclera clear OROPHARYNX: There are abnormal changes on his reconstructed tongue.  Today is also evidence of oral thrush NECK: His neck is thickened from prior radiation and surgery LYMPH:  no palpable lymphadenopathy in the  cervical, axillary or inguinal LUNGS: clear to auscultation and percussion with normal breathing effort HEART: regular rate & rhythm and no murmurs and no lower extremity edema ABDOMEN:abdomen soft, non-tender and normal bowel sounds Musculoskeletal:no cyanosis of digits and no clubbing  NEURO: alert & oriented x 3 with fluent speech, no focal motor/sensory deficits  LABORATORY DATA:  I have reviewed the data as listed    Component Value Date/Time   NA 134 (L) 05/08/2016 1138   K 4.0 05/08/2016 1138   CL 101 11/28/2015 2040   CO2 24 05/08/2016 1138   GLUCOSE 85 05/08/2016 1138   BUN 10.1 05/08/2016 1138   CREATININE 0.8 05/08/2016 1138   CALCIUM 9.6 05/08/2016 1138   PROT 7.7 05/08/2016 1138   ALBUMIN 3.8 05/08/2016 1138   AST 13 05/08/2016 1138   ALT 10 05/08/2016 1138   ALKPHOS 114 05/08/2016 1138   BILITOT 0.56  05/08/2016 1138   GFRNONAA >60 11/28/2015 2040   GFRAA >60 11/28/2015 2040    No results found for: SPEP, UPEP  Lab Results  Component Value Date   WBC 4.1 05/08/2016   NEUTROABS 2.7 05/08/2016   HGB 10.6 (L) 05/08/2016   HCT 33.5 (L) 05/08/2016   MCV 76.6 (L) 05/08/2016   PLT 273 05/08/2016      Chemistry      Component Value Date/Time   NA 134 (L) 05/08/2016 1138   K 4.0 05/08/2016 1138   CL 101 11/28/2015 2040   CO2 24 05/08/2016 1138   BUN 10.1 05/08/2016 1138   CREATININE 0.8 05/08/2016 1138      Component Value Date/Time   CALCIUM 9.6 05/08/2016 1138   ALKPHOS 114 05/08/2016 1138   AST 13 05/08/2016 1138   ALT 10 05/08/2016 1138   BILITOT 0.56 05/08/2016 1138       RADIOGRAPHIC STUDIES: I review his CT imaging from January I have personally reviewed the radiological images as listed and agreed with the findings in the report.   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[place]) The patient has been noncompliant to follow-up with multiple reschedule and no-shows I review test results from January with the patient and his wife Blood work suggestive significant hypothyroidism with iron deficiency anemia I recommend recheck blood work for further assessment If repeat blood work confirm iron deficiency anemia, he needs GI referral to rule out internal bleeding due to chronic aspirin therapy His medication for hypothyroidism need to be adjusted His exam is not normal with abnormal tongue appearance along with previous abnormal imaging study, I recommend repeat CT scan of the neck and chest with contrast I will also refer him back to ENT service for further evaluation and management  Acquired hypothyroidism His TSH from January was abnormal I plan to repeat it again  Oral candidiasis His tongue will abnormal He also has evidence of thrush I recommend 1 week treatment of fluconazole and I plan to reexamine him next week  SMOKER I spent  some time counseling the patient the importance of tobacco cessation. He is currently not interested to quit now.   Right lower lobe lung mass He had history of abnormal imaging study Today, he is not symptomatic Imaging study from January showed improvement however he is to have persistent lung nodules, along with his smoking history, I am concerned about disease recurrence Plan to repeat CT imaging  Iron deficiency anemia His last blood work confirmed iron deficiency anemia He is fatigued He is on chronic aspirin  therapy I recommend repeat blood work today   Orders Placed This Encounter  Procedures  . CT SOFT TISSUE NECK W CONTRAST    Standing Status:   Future    Standing Expiration Date:   01/13/2018    Order Specific Question:   Reason for Exam (SYMPTOM  OR DIAGNOSIS REQUIRED)    Answer:   staging tongue ca, lung nodules    Order Specific Question:   Preferred imaging location?    Answer:   [facility]  . CT CHEST W CONTRAST    Standing Status:   Future    Standing Expiration Date:   12/13/2017    Order Specific Question:   Reason for Exam (SYMPTOM  OR DIAGNOSIS REQUIRED)    Answer:   staging tongue ca, lung nodules    Order Specific Question:   Preferred imaging location?    Answer:   [facility]  . CBC with Differential/Platelet    Standing Status:   Future    Number of Occurrences:   1    Standing Expiration Date:   11/17/2017  . Comprehensive metabolic panel    Standing Status:   Future    Number of Occurrences:   1    Standing Expiration Date:   11/17/2017  . TSH    Standing Status:   Future    Number of Occurrences:   1    Standing Expiration Date:   11/17/2017  . Iron and TIBC    Standing Status:   Future    Number of Occurrences:   1    Standing Expiration Date:   11/17/2017  . Ferritin    Standing Status:   Future    Number of Occurrences:   1    Standing Expiration Date:   11/17/2017   All questions were answered. The patient knows to  call the clinic with any problems, questions or concerns. No barriers to learning was detected. I spent 25 minutes counseling the patient face to face. The total time spent in the appointment was 40 minutes and more than 50% was on counseling and review of test results     Heath Lark, MD 10/13/2016 3:42 PM

## 2016-10-13 NOTE — Assessment & Plan Note (Signed)
His tongue will abnormal He also has evidence of thrush I recommend 1 week treatment of fluconazole and I plan to reexamine him next week

## 2016-10-13 NOTE — Telephone Encounter (Signed)
Called to remind patient of appt, wife states they are on there way.

## 2016-10-13 NOTE — Telephone Encounter (Signed)
Gave patient avs report and appointments for June. Per patient he already went back to lab.

## 2016-10-13 NOTE — Assessment & Plan Note (Signed)
He had history of abnormal imaging study Today, he is not symptomatic Imaging study from January showed improvement however he is to have persistent lung nodules, along with his smoking history, I am concerned about disease recurrence Plan to repeat CT imaging

## 2016-10-13 NOTE — Assessment & Plan Note (Signed)
I spent some time counseling the patient the importance of tobacco cessation. He is currently not interested to quit now.

## 2016-10-13 NOTE — Assessment & Plan Note (Signed)
The patient has been noncompliant to follow-up with multiple reschedule and no-shows I review test results from January with the patient and his wife Blood work suggestive significant hypothyroidism with iron deficiency anemia I recommend recheck blood work for further assessment If repeat blood work confirm iron deficiency anemia, he needs GI referral to rule out internal bleeding due to chronic aspirin therapy His medication for hypothyroidism need to be adjusted His exam is not normal with abnormal tongue appearance along with previous abnormal imaging study, I recommend repeat CT scan of the neck and chest with contrast I will also refer him back to ENT service for further evaluation and management

## 2016-10-13 NOTE — Assessment & Plan Note (Signed)
His TSH from January was abnormal I plan to repeat it again

## 2016-10-14 ENCOUNTER — Telehealth: Payer: Self-pay | Admitting: *Deleted

## 2016-10-14 ENCOUNTER — Telehealth: Payer: Self-pay

## 2016-10-14 LAB — FERRITIN: Ferritin: 12 ng/ml — ABNORMAL LOW (ref 22–316)

## 2016-10-14 LAB — IRON AND TIBC
%SAT: 6 % — ABNORMAL LOW (ref 20–55)
IRON: 32 ug/dL — AB (ref 42–163)
TIBC: 493 ug/dL — AB (ref 202–409)
UIBC: 461 ug/dL — AB (ref 117–376)

## 2016-10-14 LAB — TSH: TSH: 14.161 m[IU]/L — AB (ref 0.320–4.118)

## 2016-10-14 MED ORDER — LEVOTHYROXINE SODIUM 150 MCG PO TABS: 150.0000 ug | ORAL_TABLET | Freq: Every day | ORAL | 0 refills | Status: DC

## 2016-10-14 NOTE — Telephone Encounter (Signed)
-----   Message from Heath Lark, MD sent at 10/14/2016  9:49 AM EDT ----- Regarding: abnormal labs His labs are bad Please call him 1) increase levothyroxine to 150 mcg. You may have to call for 30 days supply, no refills 2) he has bad iron def anemia. Stop meloxicam. He needs to take over the counter supplement iron daily at bed time. I can discuss with him related to possible GI referral in his return visit ----- Message ----- From: Interface, Lab In Three Zero One Sent: 10/13/2016   3:48 PM To: Heath Lark, MD

## 2016-10-14 NOTE — Telephone Encounter (Signed)
Oncology Nurse Navigator Documentation  Per Dr. Calton Dach request for follow-up with Dr. Vicie Mutters, [facility], sent secure email to Nurse [place] requesting appt.  [name], RN, BSN, [place] [facility] Oncology Nurse [place] [facility] [place] [phone]

## 2016-10-14 NOTE — Telephone Encounter (Signed)
Called with below message. Verbalized understanding. 

## 2016-10-20 ENCOUNTER — Ambulatory Visit (HOSPITAL_COMMUNITY)
Admission: RE | Admit: 2016-10-20 | Discharge: 2016-10-20 | Disposition: A | Discharge status: Home / Self Care | Payer: Medicare Other | Source: Ambulatory Visit | Attending: Hematology and Oncology | Admitting: Hematology and Oncology

## 2016-10-20 DIAGNOSIS — I2510 Atherosclerotic heart disease of native coronary artery without angina pectoris: Secondary | ICD-10-CM | POA: Diagnosis not present

## 2016-10-20 DIAGNOSIS — R918 Other nonspecific abnormal finding of lung field: Secondary | ICD-10-CM | POA: Diagnosis not present

## 2016-10-20 DIAGNOSIS — C023 Malignant neoplasm of anterior two-thirds of tongue, part unspecified: Secondary | ICD-10-CM | POA: Insufficient documentation

## 2016-10-20 DIAGNOSIS — I700 Atherosclerosis of aorta: Secondary | ICD-10-CM | POA: Diagnosis not present

## 2016-10-20 DIAGNOSIS — K8020 Calculus of gallbladder without cholecystitis without obstruction: Secondary | ICD-10-CM | POA: Insufficient documentation

## 2016-10-20 MED ORDER — IOPAMIDOL (ISOVUE-300) INJECTION 61%
INTRAVENOUS | Status: AC
  Administered 2016-10-20: 75 mL
  Filled 2016-10-20: qty 75

## 2016-10-22 ENCOUNTER — Ambulatory Visit (HOSPITAL_BASED_OUTPATIENT_CLINIC_OR_DEPARTMENT_OTHER): Payer: Medicare Other | Admitting: Hematology and Oncology

## 2016-10-22 ENCOUNTER — Telehealth: Payer: Self-pay | Admitting: Hematology and Oncology

## 2016-10-22 ENCOUNTER — Telehealth: Payer: Self-pay | Admitting: *Deleted

## 2016-10-22 ENCOUNTER — Encounter: Payer: Self-pay | Admitting: *Deleted

## 2016-10-22 VITALS — BP 144/76 | HR 90 | Temp 98.4°F | Resp 18 | Ht 68.5 in | Wt 198.4 lb

## 2016-10-22 DIAGNOSIS — D509 Iron deficiency anemia, unspecified: Secondary | ICD-10-CM | POA: Diagnosis not present

## 2016-10-22 DIAGNOSIS — F17200 Nicotine dependence, unspecified, uncomplicated: Secondary | ICD-10-CM

## 2016-10-22 DIAGNOSIS — R911 Solitary pulmonary nodule: Secondary | ICD-10-CM

## 2016-10-22 DIAGNOSIS — E039 Hypothyroidism, unspecified: Secondary | ICD-10-CM | POA: Diagnosis not present

## 2016-10-22 DIAGNOSIS — Z85810 Personal history of malignant neoplasm of tongue: Secondary | ICD-10-CM

## 2016-10-22 DIAGNOSIS — C023 Malignant neoplasm of anterior two-thirds of tongue, part unspecified: Secondary | ICD-10-CM

## 2016-10-22 NOTE — Telephone Encounter (Signed)
Oncology Nurse Navigator Documentation  In follow-up to my 6/12 email, received email reply from [facility] indicating Mr. Berk is scheduled to see Dr. Vicie Mutters on 6/27.  [name], RN, BSN, [place] [facility] Oncology Nurse [place] [facility] [place] [phone]

## 2016-10-22 NOTE — Telephone Encounter (Signed)
Scheduled appt per 6/20 los - Gave patient AVS and calender per los.

## 2016-10-23 ENCOUNTER — Encounter: Payer: Self-pay | Admitting: Hematology and Oncology

## 2016-10-23 NOTE — Assessment & Plan Note (Signed)
His TSH was abnormal He is noncompliant taking his medication as instructed I refill his prescription and plan to recheck it again in 3 months

## 2016-10-23 NOTE — Assessment & Plan Note (Signed)
CT scan show no evidence of cancer recurrence I reinforced the importance of close follow-up with ENT I will see him back again in a few months

## 2016-10-23 NOTE — Assessment & Plan Note (Signed)
Lung nodules are stable He is instructed to quit smoking I plan to recheck CT again in 1 year

## 2016-10-23 NOTE — Assessment & Plan Note (Signed)
His last blood work confirmed iron deficiency anemia I recommend he takes oral iron supplement We discussed about GI referral

## 2016-10-23 NOTE — Progress Notes (Signed)
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 Right neck LN dissection (zone 2 & 3)---1/21 LNs positive for metastatic [facility]      09/16/2012 Surgery    Tracheotomy, bilateral selective neck dissection, subtotal glossectomy with left radial forearm fasicocutaneous free flap reconstruction with split-thickness skin graft from left thigh ([place])       09/21/2012 Surgery    G-tube placement [facility])      09/23/2012 Procedure    Trach removal [facility])      10/15/2012 Imaging    Staging CT c/a/p: No acute process or evidence of metastatic disease in the chest, abd, or pelvis. Similar tiny RUL lung nodule since 2006, consistent with a benign etiology.       10/25/2012 - 12/10/2012 Radiation Therapy    [facility] Isidore Moos). Right Tongue/ Bilateral Neck /// Total Dose, 60 Gy in 30 Fractions      03/03/2013 Imaging    CT chest: Enlarging (and now cavitary) nodule in RLL measuring 1.3 cm (previous exam measured 4 mm). Suspicious for SCC metastasis.       02/2013 Procedure    PEG removed       05/09/2013 Imaging    CT Chest showed further increase in size of cavitary pulmonary nodule in the posterior right lower lobe now measuring 19 mm, with adjacent ill-defined nodular opacity possibly due to associated postobstructive atelectasis or pneumonitis. 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 Roxan Hockey)      06/06/2013 Pathology Results    Accession: [id number] pathology report from wedge resection of the lung confirmed squamous cell carcinoma of the lung      08/09/2013 Imaging    CT Chest showed interval development of multiple ground-glass attenuating nodules within the left lower lobe which are likely the sequelae of inflammation, infection or aspiration. Short-term followup imaging in 3 months is recommended to ensure resolution. 2. Status post right lower lobectomy. 3. Small right pleural effusion      11/15/2013 Imaging    Postoperative changes of right lower lobectomy. Interval resolution of the left lower lobe ground-glass opacity nodules. Small right pleural effusion persists.      07/13/2014 Imaging    CT chest: s/p RUL lobectomy. No findings to suggest recurrent disease or new mets to thorax. Tiny 4 mm LUL nodule unchanged from 10/2012 and considered benign.        12/14/2014 Imaging    CT chest: Stable postoperative chest CT. No evidence of local reurrence or metastatic disease. Stable postsurgical scarring in right lung.       06/29/2015 Imaging    CXR: Patient evaluated for acute concerns regarding fatigue, cough, and dysphagia.         03/07/2016 Imaging    Ct chest showed new ill-defined nodular ground-glass throughout the right hemi thorax with some associated septal thickening, worrisome for lymphangitic carcinomatosis. An infectious etiology is  not excluded. Aortic atherosclerosis (ICD10-170.0). Coronary artery calcification. Cholelithiasis.      05/08/2016 Imaging    Ct chest showed interval resolution of the infectious or inflammatory process in the right lung. Minimal residual tiny airspace nodules but no infiltrates or effusions.      10/20/2016 Imaging    Stable post treatment changes in the neck without evidence of recurrent disease.      10/20/2016 Imaging    1. Stable examination with scattered tiny pulmonary nodules  bilaterally. 2. No acute findings or evidence of metastatic disease. 3. Age advanced coronary artery atherosclerosis. Aortic Atherosclerosis (ICD10-I70.0). 4. Cholelithiasis.       INTERVAL HISTORY: Please see below for problem oriented charting. He returns with his wife to review test results He is attempting to quit smoking Denies chest pain or shortness of breath He is compliant taking iron supplement and thyroid medicine as instructed  REVIEW OF SYSTEMS:   Constitutional: Denies fevers, chills or abnormal weight loss Eyes: Denies blurriness of vision Ears, nose, mouth, throat, and face: Denies mucositis or sore throat Respiratory: Denies cough, dyspnea or wheezes Cardiovascular: Denies palpitation, chest discomfort or lower extremity swelling Gastrointestinal:  Denies nausea, heartburn or change in bowel habits Skin: Denies abnormal skin rashes Lymphatics: Denies new lymphadenopathy or easy bruising Neurological:Denies numbness, tingling or new weaknesses Behavioral/Psych: Mood is stable, no new changes  All other systems were reviewed with the patient and are negative.  I have reviewed the past medical history, past surgical history, social history and family history with the patient and they are unchanged from previous note.  ALLERGIES:  is allergic to hydromorphone and nubain [nalbuphine hcl].  MEDICATIONS:  Current Outpatient Prescriptions  Medication Sig Dispense Refill  . acetaminophen (TYLENOL) 500 MG tablet Take 1,000 mg by mouth every 8 (eight) hours as needed (pain).    Marland Kitchen aspirin 81 MG tablet Take 1 tablet (81 mg total) by mouth daily. 30 tablet 1  . atorvastatin (LIPITOR) 80 MG tablet Take 1 tablet (80 mg total) by mouth daily. 30 tablet 6  . buPROPion (WELLBUTRIN SR) 150 MG 12 hr tablet Take 1 tablet (150 mg total) by mouth 2 (two) times daily. 60 tablet 1  . fluconazole (DIFLUCAN) 100 MG tablet Take 1 tablet (100 mg total) by mouth daily. 7 tablet 0  .  levothyroxine (SYNTHROID) 150 MCG tablet Take 1 tablet (150 mcg total) by mouth daily before breakfast. 30 tablet 0  . meloxicam (MOBIC) 7.5 MG tablet Take 1 tablet (7.5 mg total) by mouth daily. 90 tablet 0  . metoprolol tartrate (LOPRESSOR) 25 MG tablet Take 0.5 tablets (12.5 mg total) by mouth 2 (two) times daily. 30 tablet 6  . Multiple Vitamins-Minerals (MULTIVITAMIN WITH MINERALS) tablet Take 1 tablet by mouth daily.      Marland Kitchen omeprazole (PRILOSEC) 20 MG capsule Take 1 capsule (20 mg total) by mouth daily. 30 capsule 0  . nitroGLYCERIN (NITROSTAT) 0.4 MG SL tablet Place 1 tablet (0.4 mg total) under the tongue every 5 (five) minutes as needed (up to 3 doses). For chest pain (Patient not taking: Reported on 10/22/2016) 25 tablet 3   No current facility-administered medications for this visit.     PHYSICAL EXAMINATION: ECOG PERFORMANCE STATUS: 1 - Symptomatic but completely ambulatory  Vitals:   10/22/16 1058  BP: (!) 144/76  Pulse: 90  Resp: 18  Temp: 98.4 F (36.9 C)   Filed Weights   10/22/16 1058  Weight: 198 lb 6.4 oz (90 kg)  GENERAL:alert, no distress and comfortable SKIN: skin color, texture, turgor are normal, no rashes or significant lesions EYES: normal, Conjunctiva are pink and non-injected, sclera clear OROPHARYNX:no exudate, no erythema and lips, buccal mucosa, and tongue normal  NECK: supple, thyroid normal size, non-tender, without nodularity LYMPH:  no palpable lymphadenopathy in the cervical, axillary or inguinal LUNGS: clear to auscultation and percussion with normal breathing effort HEART: regular rate & rhythm and no murmurs and no lower extremity edema ABDOMEN:abdomen soft, non-tender and normal bowel sounds Musculoskeletal:no cyanosis of digits and no clubbing  NEURO: alert & oriented x 3 with fluent speech, no focal motor/sensory deficits  LABORATORY DATA:  I have reviewed the data as listed    Component Value Date/Time   NA 136 10/13/2016 1542   K  4.0 10/13/2016 1542   CL 101 11/28/2015 2040   CO2 26 10/13/2016 1542   GLUCOSE 90 10/13/2016 1542   BUN 9.4 10/13/2016 1542   CREATININE 1.1 10/13/2016 1542   CALCIUM 9.5 10/13/2016 1542   PROT 7.6 10/13/2016 1542   ALBUMIN 3.9 10/13/2016 1542   AST 16 10/13/2016 1542   ALT 11 10/13/2016 1542   ALKPHOS 88 10/13/2016 1542   BILITOT 0.54 10/13/2016 1542   GFRNONAA >60 11/28/2015 2040   GFRAA >60 11/28/2015 2040    No results found for: SPEP, UPEP  Lab Results  Component Value Date   WBC 4.9 10/13/2016   NEUTROABS 3.3 10/13/2016   HGB 11.0 (L) 10/13/2016   HCT 35.5 (L) 10/13/2016   MCV 82.9 10/13/2016   PLT 153 10/13/2016      Chemistry      Component Value Date/Time   NA 136 10/13/2016 1542   K 4.0 10/13/2016 1542   CL 101 11/28/2015 2040   CO2 26 10/13/2016 1542   BUN 9.4 10/13/2016 1542   CREATININE 1.1 10/13/2016 1542      Component Value Date/Time   CALCIUM 9.5 10/13/2016 1542   ALKPHOS 88 10/13/2016 1542   AST 16 10/13/2016 1542   ALT 11 10/13/2016 1542   BILITOT 0.54 10/13/2016 1542       RADIOGRAPHIC STUDIES: I have personally reviewed the radiological images as listed and agreed with the findings in the report. Ct Soft Tissue Neck W Contrast  Result Date: 10/20/2016 CLINICAL DATA:  History of right oral tongue squamous cell carcinoma. Lung nodules. EXAM: CT NECK WITH CONTRAST TECHNIQUE: Multidetector CT imaging of the neck was performed using the standard protocol following the bolus administration of intravenous contrast. CONTRAST:  33mL ISOVUE-300 IOPAMIDOL (ISOVUE-300) INJECTION 61% COMPARISON:  03/07/2016 FINDINGS: Pharynx and larynx: Sequelae of subtotal glossectomy and flap reconstruction are again identified. The reconstructed tongue is unchanged in appearance from both the most recent prior examination as well as an older study from 08/09/2013. No recurrent local mass is identified. Surgical clips are again seen associated with the tongue and right  submandibular space. Mild residual pharyngeal mucosal edema is compatible with prior radiation therapy. Salivary glands: Absent right submandibular gland. Absent or atrophic left submandibular gland. No parotid mass. Thyroid: Unchanged small calcification in the right thyroid lobe. Lymph nodes: Postsurgical changes from prior bilateral neck dissection as well as changes of radiation therapy. 6 mm lymph node along the posterior margin of the right parotid tail is unchanged. No enlarged or suspicious lymph nodes are identified in the neck. Vascular: Major vascular structures of the neck are patent. Calcified atherosclerosis at both carotid bifurcations without evidence of flow limiting stenosis. Limited intracranial: Unremarkable. Visualized  orbits: Unremarkable. Mastoids and visualized paranasal sinuses: Unchanged left maxillary sinus mucous retention cyst. Partially visualized bilateral mastoid effusions. Skeleton: No suspicious lytic or blastic osseous lesion. Mild lower cervical disc degeneration. Upper chest: Evaluated on separate dedicated chest CT. Other: None. IMPRESSION: Stable post treatment changes in the neck without evidence of recurrent disease. Electronically Signed   By: Logan Bores M.D.   On: 10/20/2016 10:09   Ct Chest W Contrast  Result Date: 10/20/2016 CLINICAL DATA:  Tongue cancer diagnosed in 2014. Radiation therapy completed. EXAM: CT CHEST WITH CONTRAST TECHNIQUE: Multidetector CT imaging of the chest was performed during intravenous contrast administration. CONTRAST:  62mL ISOVUE-300 IOPAMIDOL (ISOVUE-300) INJECTION 61% COMPARISON:  Chest CT 05/08/2016 and 03/07/2016. FINDINGS: Neck findings from today have been dictated separately. Cardiovascular: There is extensive coronary artery atherosclerosis. Lesser atherosclerosis of the aorta and great vessels is noted. There are no acute vascular findings. The heart size is normal. There is no pericardial effusion. Mediastinum/Nodes: There are  no enlarged mediastinal, hilar or axillary lymph nodes.There are normal size hilar lymph nodes bilaterally. The thyroid gland, trachea and esophagus demonstrate no significant findings. Lungs/Pleura: There is no pleural effusion. Status post right lower lobe resection. No new, enlarging or suspicious pulmonary nodules. Minimal subpleural nodularity in the right middle lobe (image 70) and in the left upper lobe (image 33) unchanged. Mild biapical scarring. Upper abdomen: The visualized upper abdomen appears stable without suspicious findings. There are small calcified lymph nodes in the porta hepatis, cholelithiasis and mild aortic atherosclerosis. Musculoskeletal/Chest wall: There is no chest wall mass or suspicious osseous finding. IMPRESSION: 1. Stable examination with scattered tiny pulmonary nodules bilaterally. 2. No acute findings or evidence of metastatic disease. 3. Age advanced coronary artery atherosclerosis. Aortic Atherosclerosis (ICD10-I70.0). 4. Cholelithiasis. Electronically Signed   By: Richardean Sale M.D.   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 He is attempting to quit smoking himself  Nodule of right lung Lung nodules are stable He is instructed to quit smoking I plan to recheck CT again in 1 year   Orders Placed This Encounter  Procedures  . Ferritin    Standing Status:    Future    Standing Expiration Date:   10/22/2017  . Comprehensive metabolic panel    Standing Status:   Future    Standing Expiration Date:   11/26/2017  . Iron and TIBC    Standing Status:   Future    Standing Expiration Date:   11/26/2017  . TSH    Standing Status:   Future    Standing Expiration Date:   11/26/2017  . CBC with Differential/Platelet    Standing Status:   Future    Standing Expiration Date:   11/26/2017  . Ambulatory referral to Gastroenterology    Referral Priority:   Routine    Referral Type:   Consultation    Referral Reason:   Specialty  Services Required    Referred to Provider:   Ronald Lobo, MD    Number of Visits Requested:   1   All questions were answered. The patient knows to call the clinic with any problems, questions or concerns. No barriers to learning was detected. I spent 20 minutes counseling the patient face to face. The total time spent in the appointment was 25 minutes and more than 50% was on counseling and review of test results     Heath Lark, MD 10/23/2016 3:18 PM

## 2016-10-23 NOTE — Assessment & Plan Note (Signed)
I spent some time counseling the patient the importance of tobacco cessation. He is attempting to quit smoking himself

## 2016-10-24 ENCOUNTER — Telehealth: Payer: Self-pay | Admitting: *Deleted

## 2016-10-24 NOTE — Telephone Encounter (Signed)
Faxed referral to [facility]

## 2016-11-13 MED FILL — LEVOTHYROXINE 150 MCG TAB: 150 | 30 days supply | Qty: 30 | Fill #0

## 2016-11-14 ENCOUNTER — Other Ambulatory Visit: Payer: Self-pay | Admitting: Gastroenterology

## 2016-11-14 DIAGNOSIS — R1013 Epigastric pain: Secondary | ICD-10-CM

## 2016-11-14 DIAGNOSIS — R1311 Dysphagia, oral phase: Secondary | ICD-10-CM | POA: Diagnosis not present

## 2016-11-14 DIAGNOSIS — D500 Iron deficiency anemia secondary to blood loss (chronic): Secondary | ICD-10-CM | POA: Diagnosis not present

## 2016-11-21 ENCOUNTER — Ambulatory Visit
Admission: RE | Admit: 2016-11-21 | Discharge: 2016-11-21 | Disposition: A | Discharge status: Home / Self Care | Payer: Medicare Other | Source: Ambulatory Visit | Attending: Gastroenterology | Admitting: Gastroenterology

## 2016-11-21 ENCOUNTER — Encounter (HOSPITAL_COMMUNITY): Payer: Self-pay | Admitting: *Deleted

## 2016-11-21 DIAGNOSIS — K8020 Calculus of gallbladder without cholecystitis without obstruction: Secondary | ICD-10-CM | POA: Diagnosis not present

## 2016-11-21 DIAGNOSIS — R1013 Epigastric pain: Secondary | ICD-10-CM

## 2016-11-25 ENCOUNTER — Ambulatory Visit (HOSPITAL_COMMUNITY)
Admission: RE | Admit: 2016-11-25 | Discharge: 2016-11-25 | Disposition: A | Discharge status: Home / Self Care | Payer: Medicare Other | Source: Ambulatory Visit | Attending: Gastroenterology | Admitting: Gastroenterology

## 2016-11-25 ENCOUNTER — Encounter (HOSPITAL_COMMUNITY)
Admission: RE | Disposition: A | Discharge status: Home / Self Care | Payer: Self-pay | Source: Ambulatory Visit | Attending: Gastroenterology

## 2016-11-25 ENCOUNTER — Other Ambulatory Visit: Payer: Self-pay | Admitting: Gastroenterology

## 2016-11-25 ENCOUNTER — Ambulatory Visit (HOSPITAL_COMMUNITY): Payer: Medicare Other | Admitting: Anesthesiology

## 2016-11-25 ENCOUNTER — Encounter (HOSPITAL_COMMUNITY): Payer: Self-pay | Admitting: *Deleted

## 2016-11-25 DIAGNOSIS — Z1211 Encounter for screening for malignant neoplasm of colon: Secondary | ICD-10-CM | POA: Insufficient documentation

## 2016-11-25 DIAGNOSIS — Z7982 Long term (current) use of aspirin: Secondary | ICD-10-CM | POA: Diagnosis not present

## 2016-11-25 DIAGNOSIS — Z955 Presence of coronary angioplasty implant and graft: Secondary | ICD-10-CM | POA: Diagnosis not present

## 2016-11-25 DIAGNOSIS — F17210 Nicotine dependence, cigarettes, uncomplicated: Secondary | ICD-10-CM | POA: Insufficient documentation

## 2016-11-25 DIAGNOSIS — K219 Gastro-esophageal reflux disease without esophagitis: Secondary | ICD-10-CM | POA: Insufficient documentation

## 2016-11-25 DIAGNOSIS — Z952 Presence of prosthetic heart valve: Secondary | ICD-10-CM | POA: Insufficient documentation

## 2016-11-25 DIAGNOSIS — K5730 Diverticulosis of large intestine without perforation or abscess without bleeding: Secondary | ICD-10-CM | POA: Insufficient documentation

## 2016-11-25 DIAGNOSIS — K449 Diaphragmatic hernia without obstruction or gangrene: Secondary | ICD-10-CM | POA: Diagnosis not present

## 2016-11-25 DIAGNOSIS — I252 Old myocardial infarction: Secondary | ICD-10-CM | POA: Insufficient documentation

## 2016-11-25 DIAGNOSIS — D123 Benign neoplasm of transverse colon: Secondary | ICD-10-CM | POA: Insufficient documentation

## 2016-11-25 DIAGNOSIS — Z79899 Other long term (current) drug therapy: Secondary | ICD-10-CM | POA: Diagnosis not present

## 2016-11-25 DIAGNOSIS — I2510 Atherosclerotic heart disease of native coronary artery without angina pectoris: Secondary | ICD-10-CM | POA: Insufficient documentation

## 2016-11-25 DIAGNOSIS — J449 Chronic obstructive pulmonary disease, unspecified: Secondary | ICD-10-CM | POA: Insufficient documentation

## 2016-11-25 DIAGNOSIS — F329 Major depressive disorder, single episode, unspecified: Secondary | ICD-10-CM | POA: Diagnosis not present

## 2016-11-25 DIAGNOSIS — D125 Benign neoplasm of sigmoid colon: Secondary | ICD-10-CM | POA: Diagnosis not present

## 2016-11-25 DIAGNOSIS — D500 Iron deficiency anemia secondary to blood loss (chronic): Secondary | ICD-10-CM | POA: Insufficient documentation

## 2016-11-25 DIAGNOSIS — K648 Other hemorrhoids: Secondary | ICD-10-CM | POA: Insufficient documentation

## 2016-11-25 DIAGNOSIS — F419 Anxiety disorder, unspecified: Secondary | ICD-10-CM | POA: Insufficient documentation

## 2016-11-25 DIAGNOSIS — E039 Hypothyroidism, unspecified: Secondary | ICD-10-CM | POA: Diagnosis not present

## 2016-11-25 DIAGNOSIS — I10 Essential (primary) hypertension: Secondary | ICD-10-CM | POA: Insufficient documentation

## 2016-11-25 DIAGNOSIS — K921 Melena: Secondary | ICD-10-CM | POA: Diagnosis not present

## 2016-11-25 HISTORY — PX: COLONOSCOPY: SHX5424

## 2016-11-25 HISTORY — PX: ESOPHAGOGASTRODUODENOSCOPY (EGD) WITH PROPOFOL: SHX5813

## 2016-11-25 SURGERY — COLONOSCOPY
Anesthesia: Monitor Anesthesia Care

## 2016-11-25 MED ORDER — PROPOFOL 10 MG/ML IV BOLUS
INTRAVENOUS | Status: AC
  Filled 2016-11-25: qty 20

## 2016-11-25 MED ORDER — SODIUM CHLORIDE 0.9 % IV SOLN: INTRAVENOUS | Status: DC

## 2016-11-25 MED ORDER — LACTATED RINGERS IV SOLN
INTRAVENOUS | Status: DC
  Administered 2016-11-25: 11:00:00 via INTRAVENOUS

## 2016-11-25 MED ORDER — PROPOFOL 10 MG/ML IV BOLUS
INTRAVENOUS | Status: AC
  Filled 2016-11-25: qty 40

## 2016-11-25 MED ORDER — PROPOFOL 500 MG/50ML IV EMUL
INTRAVENOUS | Status: DC | PRN
  Administered 2016-11-25: 50 mg via INTRAVENOUS
  Administered 2016-11-25: 20 mg via INTRAVENOUS

## 2016-11-25 MED ORDER — PROPOFOL 500 MG/50ML IV EMUL
INTRAVENOUS | Status: DC | PRN
  Administered 2016-11-25: 150 ug/kg/min via INTRAVENOUS

## 2016-11-25 SURGICAL SUPPLY — 15 items

## 2016-11-25 NOTE — Transfer of Care (Signed)
Immediate Anesthesia Transfer of Care Note  Patient: [facility]  Procedure(s) Performed: Procedure(s) with comments: COLONOSCOPY (N/A) ESOPHAGOGASTRODUODENOSCOPY (EGD) WITH PROPOFOL (N/A) - neonatal scope  Patient Location: PACU  Anesthesia Type:MAC  Level of Consciousness: awake, alert  and oriented  Airway & Oxygen Therapy: Patient Spontanous Breathing and Patient connected to nasal cannula oxygen  Post-op Assessment: Report given to RN and Post -op Vital signs reviewed and stable  Post vital signs: Reviewed and stable  Last Vitals:  Vitals:   11/25/16 1035  BP: (!) 165/91  Pulse: 87  Resp: 19  Temp: (!) 36 C    Last Pain:  Vitals:   11/25/16 1035  TempSrc: Axillary         Complications: No apparent anesthesia complications

## 2016-11-25 NOTE — Op Note (Signed)
[facility] Patient Name: Trevor Griffin Procedure Date: 11/25/2016 MRN: [id number] Attending MD: Clarene Essex , MD Date of Birth: 1955-09-24 CSN: [id number] Age: 61 Admit Type: Inpatient Procedure:                Colonoscopy Indications:              Screening for colorectal malignant neoplasm,                            Incidental - Heme positive stool, Incidental - Iron                            deficiency anemia Providers:                Clarene Essex, MD, Cleda Daub, RN, William [name],                            Technician Referring MD:              Medicines:                Propofol total dose 563 mg IV Complications:            No immediate complications. Estimated Blood Loss:     Estimated blood loss: none. Procedure:                Pre-Anesthesia Assessment:                           - Prior to the procedure, a History and Physical                            was performed, and patient medications and                            allergies were reviewed. The patient's tolerance of                            previous anesthesia was also reviewed. The risks                            and benefits of the procedure and the sedation                            options and risks were discussed with the patient.                            All questions were answered, and informed consent                            was obtained. Prior Anticoagulants: The patient has                            taken aspirin, last dose was 3 days prior to  procedure. ASA Grade Assessment: III - A patient                            with severe systemic disease. After reviewing the                            risks and benefits, the patient was deemed in                            satisfactory condition to undergo the procedure.                           After obtaining informed consent, the colonoscope                            was passed under direct vision.  Throughout the                            procedure, the patient's blood pressure, pulse, and                            oxygen saturations were monitored continuously. The                            EC-[id number] ([id number]) scope was introduced through                            the anus and advanced to the the terminal ileum.                            The terminal ileum, ileocecal valve, appendiceal                            orifice, and rectum were photographed. The                            colonoscopy was performed without difficulty. The                            patient tolerated the procedure well. The quality                            of the bowel preparation was adequate. Scope In: 85:46:27 PM Scope Out: 1:22:48 PM Scope Withdrawal Time: 0 hours 21 minutes 14 seconds  Total Procedure Duration: 0 hours 26 minutes 16 seconds  Findings:      Internal hemorrhoids were found during retroflexion, during perianal       exam and during digital exam. The hemorrhoids were small.      Multiple small-mouthed diverticula were found in the sigmoid colon and       distal descending colon.      Five semi-sessile polyps were found in the distal sigmoid colon and       transverse colon. The polyps were small in size. These polyps were       removed  with a hot snare. Resection and retrieval were complete.      The terminal ileum appeared normal.      The exam was otherwise without abnormality. Impression:               - Internal hemorrhoids.                           - Diverticulosis in the sigmoid colon and in the                            distal descending colon.                           - Five small polyps in the distal sigmoid colon and                            in the transverse colon, removed with a hot snare.                            Resected and retrieved.                           - The examined portion of the ileum was normal.                           - The examination was  otherwise normal. Moderate Sedation:      N/A- Per Anesthesia Care Recommendation:           - Patient has a contact number available for                            emergencies. The signs and symptoms of potential                            delayed complications were discussed with the                            patient. Return to normal activities tomorrow.                            Written discharge instructions were provided to the                            patient.                           - Soft diet today.                           - Continue present medications.                           - Await pathology results.                           - Repeat colonoscopy in 3 years for surveillance  based on pathology results.                           - Return to GI office in 4 weeks.                           - Telephone GI clinic for pathology results in 1                            week.                           - Telephone GI clinic if symptomatic PRN. Procedure Code(s):        --- Professional ---                           [phone], Colonoscopy, flexible; with removal of                            tumor(s), polyp(s), or other lesion(s) by snare                            technique Diagnosis Code(s):        --- Professional ---                           Z12.11, Encounter for screening for malignant                            neoplasm of colon                           D12.5, Benign neoplasm of sigmoid colon                           D12.3, Benign neoplasm of transverse colon (hepatic                            flexure or splenic flexure)                           K57.30, Diverticulosis of large intestine without                            perforation or abscess without bleeding CPT copyright 2016 American Medical Association. All rights reserved. The codes documented in this report are preliminary and upon coder review may  be revised to meet current  compliance requirements. Clarene Essex, MD 11/25/2016 1:42:10 PM This report has been signed electronically. Number of Addenda: 0

## 2016-11-25 NOTE — Progress Notes (Signed)
Simon Llamas Masterson 12:31 PM  Subjective: Patient doing better than when we saw him in the office and no signs of bleeding and Nexium is helping and stopping Mylanta has helped his diarrhea and he has no new complaints and we discussed his ultrasound and gallstones  Objective: Vital signs stable afebrile no acute distress exam please see preassessment evaluation labs reviewed  Assessment: Iron deficiency  Plan: Okay to proceed with colonoscopy and possible endoscopy with anesthesia assistance  [facility] E  Pager [phone] After 5PM or if no answer call [phone]

## 2016-11-25 NOTE — Anesthesia Preprocedure Evaluation (Addendum)
Anesthesia Evaluation    Reviewed: reviewed documented beta blocker date and time   Airway Mallampati: I  TM Distance: >3 FB Neck ROM: Full    Dental  (+) Edentulous Upper, Edentulous Lower   Pulmonary COPD, Current Smoker,    breath sounds clear to auscultation       Cardiovascular hypertension, Pt. on home beta blockers + CAD and + Past MI   Rhythm:Regular Rate:Normal     Neuro/Psych  Headaches, PSYCHIATRIC DISORDERS Anxiety Depression    GI/Hepatic Neg liver ROS, GERD  Medicated,  Endo/Other  Hypothyroidism   Renal/GU negative Renal ROS     Musculoskeletal negative musculoskeletal ROS (+)   Abdominal   Peds  Hematology negative hematology ROS (+)   Anesthesia Other Findings   Reproductive/Obstetrics                            Anesthesia Physical Anesthesia Plan  ASA: III  Anesthesia Plan: MAC   Post-op Pain Management:    Induction: Intravenous  PONV Risk Score and Plan: Propofol  Airway Management Planned:   Additional Equipment:   Intra-op Plan:   Post-operative Plan:   Informed Consent: I have reviewed the patients History and Physical, chart, labs and discussed the procedure including the risks, benefits and alternatives for the proposed anesthesia with the patient or authorized representative who has indicated his/her understanding and acceptance.     Plan Discussed with:   Anesthesia Plan Comments:         Anesthesia Quick Evaluation

## 2016-11-25 NOTE — Discharge Instructions (Signed)
Call for biopsy report in 1 week call sooner if question or problem otherwise follow-up in 4-6 weeks   YOU HAD AN ENDOSCOPIC PROCEDURE TODAY: Refer to the procedure report and other information in the discharge instructions given to you for any specific questions about what was found during the examination. If this information does not answer your questions, please call [facility] office at [phone] to clarify.   YOU SHOULD EXPECT: Some feelings of bloating in the abdomen. Passage of more gas than usual. Walking can help get rid of the air that was put into your GI tract during the procedure and reduce the bloating. If you had a lower endoscopy (such as a colonoscopy or flexible sigmoidoscopy) you may notice spotting of blood in your stool or on the toilet paper. Some abdominal soreness may be present for a day or two, also.  DIET: Your first meal following the procedure should be a light meal and then it is ok to progress to your normal diet. A half-sandwich or bowl of soup is an example of a good first meal. Heavy or fried foods are harder to digest and may make you feel nauseous or bloated. Drink plenty of fluids but you should avoid alcoholic beverages for 24 hours. If you had a esophageal dilation, please see attached instructions for diet.   ACTIVITY: Your care partner should take you home directly after the procedure. You should plan to take it easy, moving slowly for the rest of the day. You can resume normal activity the day after the procedure however YOU SHOULD NOT DRIVE, use power tools, machinery or perform tasks that involve climbing or major physical exertion for 24 hours (because of the sedation medicines used during the test).   SYMPTOMS TO REPORT IMMEDIATELY: A gastroenterologist can be reached at any hour. Please call [phone]  for any of the following symptoms:  Following lower endoscopy (colonoscopy, flexible sigmoidoscopy) Excessive amounts of blood in the stool    Significant tenderness, worsening of abdominal pains  Swelling of the abdomen that is new, acute  Fever of 100 or higher  Following upper endoscopy (EGD, EUS, ERCP, esophageal dilation) Vomiting of blood or coffee ground material  New, significant abdominal pain  New, significant chest pain or pain under the shoulder blades  Painful or persistently difficult swallowing  New shortness of breath  Black, tarry-looking or red, bloody stools  FOLLOW UP:  If any biopsies were taken you will be contacted by phone or by letter within the next 1-3 weeks. Call [phone]  if you have not heard about the biopsies in 3 weeks.  Please also call with any specific questions about appointments or follow up tests. YOU HAD AN ENDOSCOPIC PROCEDURE TODAY: Refer to the procedure report and other information in the discharge instructions given to you for any specific questions about what was found during the examination. If this information does not answer your questions, please call [facility] office at [phone] to clarify.   YOU SHOULD EXPECT: Some feelings of bloating in the abdomen. Passage of more gas than usual. Walking can help get rid of the air that was put into your GI tract during the procedure and reduce the bloating. If you had a lower endoscopy (such as a colonoscopy or flexible sigmoidoscopy) you may notice spotting of blood in your stool or on the toilet paper. Some abdominal soreness may be present for a day or two, also.  DIET: Your first meal following the procedure should be a light  meal and then it is ok to progress to your normal diet. A half-sandwich or bowl of soup is an example of a good first meal. Heavy or fried foods are harder to digest and may make you feel nauseous or bloated. Drink plenty of fluids but you should avoid alcoholic beverages for 24 hours. If you had a esophageal dilation, please see attached instructions for diet.   ACTIVITY: Your care partner should take you home  directly after the procedure. You should plan to take it easy, moving slowly for the rest of the day. You can resume normal activity the day after the procedure however YOU SHOULD NOT DRIVE, use power tools, machinery or perform tasks that involve climbing or major physical exertion for 24 hours (because of the sedation medicines used during the test).   SYMPTOMS TO REPORT IMMEDIATELY: A gastroenterologist can be reached at any hour. Please call [phone]  for any of the following symptoms:  Following lower endoscopy (colonoscopy, flexible sigmoidoscopy) Excessive amounts of blood in the stool  Significant tenderness, worsening of abdominal pains  Swelling of the abdomen that is new, acute  Fever of 100 or higher  Following upper endoscopy (EGD, EUS, ERCP, esophageal dilation) Vomiting of blood or coffee ground material  New, significant abdominal pain  New, significant chest pain or pain under the shoulder blades  Painful or persistently difficult swallowing  New shortness of breath  Black, tarry-looking or red, bloody stools  FOLLOW UP:  If any biopsies were taken you will be contacted by phone or by letter within the next 1-3 weeks. Call [phone]  if you have not heard about the biopsies in 3 weeks.  Please also call with any specific questions about appointments or follow up tests.

## 2016-11-25 NOTE — Anesthesia Postprocedure Evaluation (Signed)
Anesthesia Post Note  Patient: Trevor Griffin [facility]  Procedure(s) Performed: Procedure(s) (LRB): COLONOSCOPY (N/A) ESOPHAGOGASTRODUODENOSCOPY (EGD) WITH PROPOFOL (N/A)     Patient location during evaluation: PACU Anesthesia Type: MAC Level of consciousness: awake and alert Pain management: pain level controlled Vital Signs Assessment: post-procedure vital signs reviewed and stable Respiratory status: spontaneous breathing, nonlabored ventilation, respiratory function stable and patient connected to nasal cannula oxygen Cardiovascular status: stable and blood pressure returned to baseline Anesthetic complications: no    Last Vitals:  Vitals:   11/25/16 1400 11/25/16 1410  BP: (!) 147/81 (!) 158/74  Pulse: 74 65  Resp: 18 16  Temp:      Last Pain:  Vitals:   11/25/16 1344  TempSrc: Oral                 Effie Berkshire

## 2016-11-25 NOTE — Op Note (Signed)
[facility] Patient Name: Trevor Griffin Procedure Date: 11/25/2016 MRN: [id number] Attending MD: Clarene Essex , MD Date of Birth: 11/28/1955 CSN: [id number] Age: 61 Admit Type: Inpatient Procedure:                Upper GI endoscopy Indications:              Iron deficiency anemia, For therapy of esophageal                            reflux Providers:                Clarene Essex, MD, Cleda Daub, RN, William [name],                            Technician Referring MD:              Medicines:                Propofol total dose 50 mg IV Complications:            No immediate complications. Estimated Blood Loss:     Estimated blood loss: none. Procedure:                Pre-Anesthesia Assessment:                           - Prior to the procedure, a History and Physical                            was performed, and patient medications and                            allergies were reviewed. The patient's tolerance of                            previous anesthesia was also reviewed. The risks                            and benefits of the procedure and the sedation                            options and risks were discussed with the patient.                            All questions were answered, and informed consent                            was obtained. Prior Anticoagulants: The patient has                            taken aspirin, last dose was 3 days prior to                            procedure. ASA Grade Assessment: III - A patient  with severe systemic disease. After reviewing the                            risks and benefits, the patient was deemed in                            satisfactory condition to undergo the procedure.                           - Prior to the procedure, a History and Physical                            was performed, and patient medications and                            allergies were reviewed. The patient's  tolerance of                            previous anesthesia was also reviewed. The risks                            and benefits of the procedure and the sedation                            options and risks were discussed with the patient.                            All questions were answered, and informed consent                            was obtained. Prior Anticoagulants: The patient has                            taken aspirin, last dose was 3 days prior to                            procedure. ASA Grade Assessment: III - A patient                            with severe systemic disease. After reviewing the                            risks and benefits, the patient was deemed in                            satisfactory condition to undergo the procedure.                           After obtaining informed consent, the endoscope was                            passed under direct vision. Throughout the  procedure, the patient's blood pressure, pulse, and                            oxygen saturations were monitored continuously. The                            [id number]) scope was introduced through the                            mouth, and advanced to the third part of duodenum.                            The was introduced through the and advanced to the.                            The upper GI endoscopy was accomplished without                            difficulty. The patient tolerated the procedure                            well. Scope In: Scope Out: Findings:      The larynx was normal.      A medium-sized hiatal hernia was present.      A small scar from previous gastrostomy tube was found on the greater       curvature of the stomach. The scar tissue was healthy in appearance.      The duodenal bulb, first portion of the duodenum, second portion of the       duodenum, area of the papilla and third portion of the duodenum were       normal.       The exam was otherwise without abnormality. Impression:               - Normal larynx.                           - Medium-sized hiatal hernia.                           - Scar in the greater curvature of the stomach.                           - Normal duodenal bulb, first portion of the                            duodenum, second portion of the duodenum, area of                            the papilla and third portion of the duodenum.                           - The examination was otherwise normal.                           -  No specimens collected. Moderate Sedation:      N/A- Per Anesthesia Care Recommendation:           - Patient has a contact number available for                            emergencies. The signs and symptoms of potential                            delayed complications were discussed with the                            patient. Return to normal activities tomorrow.                            Written discharge instructions were provided to the                            patient.                           - Soft diet today.                           - Continue present medications.                           - Await pathology results.                           - Return to GI clinic in 4 weeks.                           - Telephone GI clinic for pathology results in 1                            week.                           - Telephone GI clinic if symptomatic PRN. Procedure Code(s):        --- Professional ---                           [phone], Esophagogastroduodenoscopy, flexible,                            transoral; diagnostic, including collection of                            specimen(s) by brushing or washing, when performed                            (separate procedure) Diagnosis Code(s):        --- Professional ---                           K44.9, Diaphragmatic hernia without obstruction or  gangrene                            K31.89, Other diseases of stomach and duodenum                           D50.9, Iron deficiency anemia, unspecified                           K21.9, Gastro-esophageal reflux disease without                            esophagitis CPT copyright 2016 [employer]. All rights reserved. The codes documented in this report are preliminary and upon coder review may  be revised to meet current compliance requirements. Clarene Essex, MD 11/25/2016 1:46:02 PM This report has been signed electronically. Number of Addenda: 0

## 2016-11-25 NOTE — Anesthesia Procedure Notes (Signed)
Date/Time: 11/25/2016 12:47 PM Performed by: Glory Buff Oxygen Delivery Method: Nasal cannula

## 2016-11-27 ENCOUNTER — Encounter (HOSPITAL_COMMUNITY): Payer: Self-pay | Admitting: Gastroenterology

## 2017-01-07 ENCOUNTER — Telehealth: Payer: Self-pay | Admitting: *Deleted

## 2017-01-07 NOTE — Telephone Encounter (Signed)
Wife called requesting a referral to Dr Glenetta Hew, cardiologist. States he has a history of 2 massive heart attacks and no longer sees Dr Aundra Dubin. Does not have a PCP. Next appt with Dr Alvy Bimler is Monday, 9/10 and will discuss at that time

## 2017-01-07 NOTE — Telephone Encounter (Signed)
I will discuss and refer when I see him next week

## 2017-01-09 ENCOUNTER — Other Ambulatory Visit: Payer: Medicare Other

## 2017-01-12 ENCOUNTER — Ambulatory Visit (HOSPITAL_BASED_OUTPATIENT_CLINIC_OR_DEPARTMENT_OTHER): Payer: Medicare Other

## 2017-01-12 ENCOUNTER — Encounter: Payer: Self-pay | Admitting: Hematology and Oncology

## 2017-01-12 ENCOUNTER — Other Ambulatory Visit: Payer: Self-pay | Admitting: *Deleted

## 2017-01-12 ENCOUNTER — Ambulatory Visit (HOSPITAL_BASED_OUTPATIENT_CLINIC_OR_DEPARTMENT_OTHER): Payer: Medicare Other | Admitting: Hematology and Oncology

## 2017-01-12 ENCOUNTER — Telehealth: Payer: Self-pay | Admitting: Hematology and Oncology

## 2017-01-12 ENCOUNTER — Other Ambulatory Visit: Payer: Self-pay | Admitting: Hematology and Oncology

## 2017-01-12 ENCOUNTER — Telehealth: Payer: Self-pay | Admitting: *Deleted

## 2017-01-12 VITALS — BP 142/72 | HR 84 | Temp 97.5°F | Resp 20 | Ht 68.0 in | Wt 198.6 lb

## 2017-01-12 DIAGNOSIS — Z85118 Personal history of other malignant neoplasm of bronchus and lung: Secondary | ICD-10-CM | POA: Diagnosis not present

## 2017-01-12 DIAGNOSIS — C023 Malignant neoplasm of anterior two-thirds of tongue, part unspecified: Secondary | ICD-10-CM

## 2017-01-12 DIAGNOSIS — L089 Local infection of the skin and subcutaneous tissue, unspecified: Secondary | ICD-10-CM | POA: Diagnosis not present

## 2017-01-12 DIAGNOSIS — B9689 Other specified bacterial agents as the cause of diseases classified elsewhere: Secondary | ICD-10-CM

## 2017-01-12 DIAGNOSIS — D509 Iron deficiency anemia, unspecified: Secondary | ICD-10-CM | POA: Diagnosis not present

## 2017-01-12 DIAGNOSIS — E039 Hypothyroidism, unspecified: Secondary | ICD-10-CM

## 2017-01-12 DIAGNOSIS — Z720 Tobacco use: Secondary | ICD-10-CM

## 2017-01-12 DIAGNOSIS — I255 Ischemic cardiomyopathy: Secondary | ICD-10-CM

## 2017-01-12 DIAGNOSIS — R918 Other nonspecific abnormal finding of lung field: Secondary | ICD-10-CM

## 2017-01-12 DIAGNOSIS — F17200 Nicotine dependence, unspecified, uncomplicated: Secondary | ICD-10-CM

## 2017-01-12 LAB — FERRITIN: FERRITIN: 23 ng/mL (ref 22–316)

## 2017-01-12 LAB — COMPREHENSIVE METABOLIC PANEL
ALT: 16 U/L (ref 0–55)
ANION GAP: 11 meq/L (ref 3–11)
AST: 21 U/L (ref 5–34)
Albumin: 3.6 g/dL (ref 3.5–5.0)
Alkaline Phosphatase: 94 U/L (ref 40–150)
BUN: 8 mg/dL (ref 7.0–26.0)
CALCIUM: 9.7 mg/dL (ref 8.4–10.4)
CO2: 24 meq/L (ref 22–29)
CREATININE: 0.9 mg/dL (ref 0.7–1.3)
Chloride: 100 mEq/L (ref 98–109)
EGFR: >90 mL/min/{1.73_m2} (ref >90)
Glucose: 91 mg/dl (ref 70–140)
POTASSIUM: 4.7 meq/L (ref 3.5–5.1)
Sodium: 135 mEq/L — ABNORMAL LOW (ref 136–145)
Total Bilirubin: 0.41 mg/dL (ref 0.20–1.20)
Total Protein: 7.7 g/dL (ref 6.4–8.3)

## 2017-01-12 LAB — IRON AND TIBC
%SAT: 5 % — AB (ref 20–55)
IRON: 20 ug/dL — AB (ref 42–163)
TIBC: 431 ug/dL — AB (ref 202–409)
UIBC: 411 ug/dL — AB (ref 117–376)

## 2017-01-12 LAB — CBC WITH DIFFERENTIAL/PLATELET
BASO%: 0.8 % (ref 0.0–2.0)
Basophils Absolute: 0 10*3/uL (ref 0.0–0.1)
EOS%: 1.8 % (ref 0.0–7.0)
Eosinophils Absolute: 0.1 10*3/uL (ref 0.0–0.5)
HCT: 38.2 % — ABNORMAL LOW (ref 38.4–49.9)
HGB: 11.4 g/dL — ABNORMAL LOW (ref 13.0–17.1)
LYMPH%: 15.4 % (ref 14.0–49.0)
MCH: 25.4 pg — ABNORMAL LOW (ref 27.2–33.4)
MCHC: 29.8 g/dL — AB (ref 32.0–36.0)
MCV: 85.1 fL (ref 79.3–98.0)
MONO#: 0.5 10*3/uL (ref 0.1–0.9)
MONO%: 13.6 % (ref 0.0–14.0)
NEUT%: 68.4 % (ref 39.0–75.0)
NEUTROS ABS: 2.7 10*3/uL (ref 1.5–6.5)
PLATELETS: 163 10*3/uL (ref 140–400)
RBC: 4.49 10*6/uL (ref 4.20–5.82)
RDW: 18.2 % — ABNORMAL HIGH (ref 11.0–14.6)
WBC: 4 10*3/uL (ref 4.0–10.3)
lymph#: 0.6 10*3/uL — ABNORMAL LOW (ref 0.9–3.3)

## 2017-01-12 LAB — TSH: TSH: 5.096 m[IU]/L — AB (ref 0.320–4.118)

## 2017-01-12 MED ORDER — LEVOTHYROXINE SODIUM 100 MCG PO CAPS: 100.0000 ug | ORAL_CAPSULE | Freq: Every day | ORAL | 3 refills | Status: DC

## 2017-01-12 MED ORDER — CEPHALEXIN 500 MG PO CAPS: 500.0000 mg | ORAL_CAPSULE | Freq: Three times a day (TID) | ORAL | 0 refills | Status: DC

## 2017-01-12 MED ORDER — LEVOTHYROXINE SODIUM 100 MCG PO TABS: 100.0000 ug | ORAL_TABLET | Freq: Every day | ORAL | 3 refills | Status: DC

## 2017-01-12 MED FILL — CEPHALEXIN 500 MG CAPSULE: 500 | 7 days supply | Qty: 21 | Fill #0

## 2017-01-12 NOTE — Assessment & Plan Note (Signed)
I spent some time counseling the patient the importance of tobacco cessation. He is attempting to quit smoking himself

## 2017-01-12 NOTE — Progress Notes (Signed)
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 Right neck LN dissection (zone 2 & 3)---1/21 LNs positive for metastatic [facility]      09/16/2012 Surgery    Tracheotomy, bilateral selective neck dissection, subtotal glossectomy with left radial forearm fasicocutaneous free flap reconstruction with split-thickness skin graft from left thigh ([place])       09/21/2012 Surgery    G-tube placement [facility])      09/23/2012 Procedure    Trach removal [facility]      10/15/2012 Imaging    Staging CT c/a/p: No acute process or evidence of metastatic disease in the chest, abd, or pelvis. Similar tiny RUL lung nodule since 2006, consistent with a benign etiology.       10/25/2012 - 12/10/2012 Radiation Therapy    [facility] Isidore Moos). Right Tongue/ Bilateral Neck /// Total Dose, 60 Gy in 30 Fractions      03/03/2013 Imaging    CT chest: Enlarging (and now cavitary) nodule in RLL measuring 1.3 cm (previous exam measured 4 mm). Suspicious for SCC metastasis.       02/2013 Procedure    PEG removed       05/09/2013 Imaging    CT Chest showed further increase in size of cavitary pulmonary nodule in the posterior right lower lobe now measuring 19 mm, with adjacent ill-defined nodular opacity possibly due to associated postobstructive atelectasis or pneumonitis. 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 Roxan Hockey)      06/06/2013 Pathology Results     Accession: [id number] pathology report from wedge resection of the lung confirmed squamous cell carcinoma of the lung      08/09/2013 Imaging    CT Chest showed interval development of multiple ground-glass attenuating nodules within the left lower lobe which are likely the sequelae of inflammation, infection or aspiration. Short-term followup imaging in 3 months is recommended to ensure resolution. 2. Status post right lower lobectomy. 3. Small right pleural effusion      11/15/2013 Imaging    Postoperative changes of right lower lobectomy. Interval resolution of the left lower lobe ground-glass opacity nodules. Small right pleural effusion persists.      07/13/2014 Imaging    CT chest: s/p RUL lobectomy. No findings to suggest recurrent disease or new mets to thorax. Tiny 4 mm LUL nodule unchanged from 10/2012 and considered benign.        12/14/2014 Imaging    CT chest: Stable postoperative chest CT. No evidence of local reurrence or metastatic disease. Stable postsurgical scarring in right lung.       06/29/2015 Imaging    CXR: Patient evaluated for acute concerns regarding fatigue, cough, and dysphagia.         03/07/2016 Imaging    Ct chest showed new ill-defined nodular ground-glass throughout the right hemi thorax with some associated septal thickening, worrisome for lymphangitic carcinomatosis. An infectious etiology is not excluded. Aortic atherosclerosis (ICD10-170.0). Coronary artery calcification. Cholelithiasis.  05/08/2016 Imaging    Ct chest showed interval resolution of the infectious or inflammatory process in the right lung. Minimal residual tiny airspace nodules but no infiltrates or effusions.      10/20/2016 Imaging    Stable post treatment changes in the neck without evidence of recurrent disease.      10/20/2016 Imaging    1. Stable examination with scattered tiny pulmonary nodules bilaterally. 2. No acute findings or evidence of metastatic disease. 3. Age  advanced coronary artery atherosclerosis. Aortic Atherosclerosis (ICD10-I70.0). 4. Cholelithiasis.       History of lung cancer   06/06/2013 Pathology Results    1. Lung, wedge biopsy/resection, Right lower lobe - SQUAMOUS CELL CARCINOMA, 3 CM. 2. Lung, wedge biopsy/resection, Right upper lobe - BENIGN LUNG TISSUE. - NO TUMOR IDENTIFIED. 3. Lung, resection (segmental or lobe), Right lower lobe - FOCAL HEMORRHAGE CONSISTENT WITH PREVIOUS WEDGE. - NO RESIDUAL TUMOR IDENTIFIED. - FINAL MARGINS CLEAR. 4. Lymph node, biopsy, 11R#1 - ANTHRACOTIC LYMPH NODE. - NO TUMOR IDENTIFIED 5. Lymph node, biopsy, 11R#2 - ANTHRACOTIC LYMPH NODE. - NO TUMOR IDENTIFIED. 6. Lymph node, biopsy, 10 R #1 - ANTHRACOTIC LYMPH NODE. - NO TUMOR IDENTIFIED. Marland Kitchen 7. Lymph node, biopsy, 10 R #2 - ANTHRACOTIC LYMPH NODE. - NO TUMOR IDENTIFIED. 8. Lymph node, biopsy, level 9 - ANTHRACOTIC LYMPH NODE. - NO TUMOR IDENTIFIED.      06/06/2013 Surgery    Right video-assisted thoracoscopy, wedge resection, thoracoscopic right lower lobectomy, lymph node dissection       INTERVAL HISTORY: Please see below for problem oriented charting. He returns with his wife The patient continues to smoke He is noncompliant taking his thyroid medication and is only taking thyroid medicine every other day When I asked him why he is not taking medication as prescribed, he said it cost too much money to buy thyroid medication His wife noted that he had recently had a skin nodule/infection near the groin area He has not seen a primary doctor for some time He has no recent follow-up with ENT He has not seen a cardiologist for a while and his wife is requesting a referral He denies swallowing difficulties. He denies new nodules in the neck Denies recent cough, shortness of breath or hemoptysis.  REVIEW OF SYSTEMS:   Constitutional: Denies fevers, chills or abnormal weight loss Eyes: Denies blurriness of vision Ears, nose, mouth,  throat, and face: Denies mucositis or sore throat Respiratory: Denies cough, dyspnea or wheezes Cardiovascular: Denies palpitation, chest discomfort or lower extremity swelling Gastrointestinal:  Denies nausea, heartburn or change in bowel habits Lymphatics: Denies new lymphadenopathy or easy bruising Neurological:Denies numbness, tingling or new weaknesses Behavioral/Psych: Mood is stable, no new changes  All other systems were reviewed with the patient and are negative.  I have reviewed the past medical history, past surgical history, social history and family history with the patient and they are unchanged from previous note.  ALLERGIES:  is allergic to hydromorphone and nubain [nalbuphine hcl].  MEDICATIONS:  Current Outpatient Prescriptions  Medication Sig Dispense Refill  . acetaminophen (TYLENOL) 500 MG tablet Take 1,000 mg by mouth every 8 (eight) hours as needed (pain).    Marland Kitchen aspirin 81 MG tablet Take 1 tablet (81 mg total) by mouth daily. 30 tablet 1  . atorvastatin (LIPITOR) 80 MG tablet Take 1 tablet (80 mg total) by mouth daily. 30 tablet 6  . buPROPion (WELLBUTRIN SR) 150 MG 12 hr tablet Take 1 tablet (150 mg total) by mouth 2 (  two) times daily. 60 tablet 1  . cephALEXin (KEFLEX) 500 MG capsule Take 1 capsule (500 mg total) by mouth 3 (three) times daily. 21 capsule 0  . fluconazole (DIFLUCAN) 100 MG tablet Take 1 tablet (100 mg total) by mouth daily. (Patient not taking: Reported on 11/24/2016) 7 tablet 0  . levothyroxine (SYNTHROID, LEVOTHROID) 100 MCG tablet Take 1 tablet (100 mcg total) by mouth daily before breakfast. 30 tablet 3  . metoprolol tartrate (LOPRESSOR) 25 MG tablet Take 0.5 tablets (12.5 mg total) by mouth 2 (two) times daily. 30 tablet 6  . Multiple Vitamins-Minerals (MULTIVITAMIN WITH MINERALS) tablet Take 1 tablet by mouth daily.      . nitroGLYCERIN (NITROSTAT) 0.4 MG SL tablet Place 1 tablet (0.4 mg total) under the tongue every 5 (five) minutes as needed  (up to 3 doses). For chest pain 25 tablet 3  . omeprazole (PRILOSEC) 20 MG capsule Take 1 capsule (20 mg total) by mouth daily. 30 capsule 0   No current facility-administered medications for this visit.     PHYSICAL EXAMINATION: ECOG PERFORMANCE STATUS: 1 - Symptomatic but completely ambulatory  Vitals:   01/12/17 0924  BP: (!) 142/72  Pulse: 84  Resp: 20  Temp: (!) 97.5 F (36.4 C)  SpO2: 98%   Filed Weights   01/12/17 0924  Weight: 198 lb 9.6 oz (90.1 kg)    GENERAL:alert, no distress and comfortable SKIN: skin color, texture, turgor are normal, no rashes or significant lesions EYES: normal, Conjunctiva are pink and non-injected, sclera clear OROPHARYNX:no exudate, no erythema and lips, buccal mucosa, and tongue resection changes NECK: supple, thyroid normal size, non-tender, without nodularity LYMPH:  no palpable lymphadenopathy in the cervical, axillary or inguinal LUNGS: clear to auscultation and percussion with normal breathing effort HEART: regular rate & rhythm and no murmurs and no lower extremity edema ABDOMEN:abdomen soft, non-tender and normal bowel sounds Musculoskeletal:no cyanosis of digits and no clubbing  NEURO: alert & oriented x 3 with fluent speech, no focal motor/sensory deficits  LABORATORY DATA:  I have reviewed the data as listed    Component Value Date/Time   NA 135 (L) 01/12/2017 0845   K 4.7 01/12/2017 0845   CL 101 11/28/2015 2040   CO2 24 01/12/2017 0845   GLUCOSE 91 01/12/2017 0845   BUN 8.0 01/12/2017 0845   CREATININE 0.9 01/12/2017 0845   CALCIUM 9.7 01/12/2017 0845   PROT 7.7 01/12/2017 0845   ALBUMIN 3.6 01/12/2017 0845   AST 21 01/12/2017 0845   ALT 16 01/12/2017 0845   ALKPHOS 94 01/12/2017 0845   BILITOT 0.41 01/12/2017 0845   GFRNONAA >60 11/28/2015 2040   GFRAA >60 11/28/2015 2040    No results found for: SPEP, UPEP  Lab Results  Component Value Date   WBC 4.0 01/12/2017   NEUTROABS 2.7 01/12/2017   HGB 11.4 (L)  01/12/2017   HCT 38.2 (L) 01/12/2017   MCV 85.1 01/12/2017   PLT 163 01/12/2017      Chemistry      Component Value Date/Time   NA 135 (L) 01/12/2017 0845   K 4.7 01/12/2017 0845   CL 101 11/28/2015 2040   CO2 24 01/12/2017 0845   BUN 8.0 01/12/2017 0845   CREATININE 0.9 01/12/2017 0845      Component Value Date/Time   CALCIUM 9.7 01/12/2017 0845   ALKPHOS 94 01/12/2017 0845   AST 21 01/12/2017 0845   ALT 16 01/12/2017 0845   BILITOT 0.41 01/12/2017 0845  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[place]) He has not been followed by ENT clinic for a while Clinical exam is satisfactory but I emphasized the importance of close follow-up with ENT I plan to repeat imaging study next year due to high risk of cancer recurrence with his ongoing smoking habits  History of lung cancer The patient had wedge resection of the lung nodule that came back squamous cell carcinoma Primary lung cancer cannot be excluded He was being observed He continues to smoke We discussed the importance of nicotine cessation I plan to repeat imaging study next year with CT scan of the lung  SMOKER I spent some time counseling the patient the importance of tobacco cessation. He is attempting to quit smoking himself  Cardiomyopathy, ischemic-EF 50% by 2D Oct 2015 The patient have history of ischemic cardiomyopathy He is on medical management His wife requested referral to see cardiologist and I have sent referral per patient's wife request  Acquired hypothyroidism He has acquired hypothyroidism He is not taking thyroid medicine as prescribed Repeat TSH is better Given the dose that he was noncompliant and was taking every other day, I recommend slight dose change He needs to get TSH repeated in 3 months If he is not able to get established with a primary doctor, I will bring him back in 3 months with repeat thyroid function test monitoring.  Iron deficiency anemia He has  persistent iron deficiency anemia Recent EGD and colonoscopy revealed hemorrhoids I recommend oral iron supplement daily  Bacterial skin infection He had recent bug bite with skin infection I recommend antibiotic treatment for 7 days   Orders Placed This Encounter  Procedures  . CT CHEST W CONTRAST    Standing Status:   Future    Standing Expiration Date:   02/16/2018    Order Specific Question:   If indicated for the ordered procedure, I authorize the administration of contrast media per Radiology protocol    Answer:   Yes    Order Specific Question:   Preferred imaging location?    Answer:   [facility]    Order Specific Question:   Radiology Contrast Protocol - do NOT remove file path    Answer:   \\[email or web address]  . CT Soft Tissue Neck W Contrast    Standing Status:   Future    Standing Expiration Date:   02/16/2018    Order Specific Question:   If indicated for the ordered procedure, I authorize the administration of contrast media per Radiology protocol    Answer:   Yes    Order Specific Question:   Preferred imaging location?    Answer:   [facility]    Order Specific Question:   Radiology Contrast Protocol - do NOT remove file path    Answer:   \\[email or web address]  . Comprehensive metabolic panel    Standing Status:   Future    Standing Expiration Date:   02/16/2018  . CBC with Differential/Platelet    Standing Status:   Future    Standing Expiration Date:   02/16/2018  . TSH    Standing Status:   Future    Standing Expiration Date:   02/16/2018  . Ambulatory referral to Cardiology    Referral Priority:   Routine    Referral Type:   Consultation    Referral Reason:   Specialty Services Required    Referred to Provider:   Leonie Man, MD    Requested  Specialty:   Cardiology    Number of Visits Requested:   1   All questions were answered. The patient knows to call the clinic with any  problems, questions or concerns. No barriers to learning was detected. I spent 25 minutes counseling the patient face to face. The total time spent in the appointment was 40 minutes and more than 50% was on counseling and review of test results     Heath Lark, MD 01/12/2017 3:13 PM

## 2017-01-12 NOTE — Assessment & Plan Note (Signed)
He has acquired hypothyroidism He is not taking thyroid medicine as prescribed Repeat TSH is better Given the dose that he was noncompliant and was taking every other day, I recommend slight dose change He needs to get TSH repeated in 3 months If he is not able to get established with a primary doctor, I will bring him back in 3 months with repeat thyroid function test monitoring.

## 2017-01-12 NOTE — Assessment & Plan Note (Signed)
The patient had wedge resection of the lung nodule that came back squamous cell carcinoma Primary lung cancer cannot be excluded He was being observed He continues to smoke We discussed the importance of nicotine cessation I plan to repeat imaging study next year with CT scan of the lung

## 2017-01-12 NOTE — Telephone Encounter (Signed)
Gave avs and calendar for June 2019  °

## 2017-01-12 NOTE — Assessment & Plan Note (Signed)
He has not been followed by ENT clinic for a while Clinical exam is satisfactory but I emphasized the importance of close follow-up with ENT I plan to repeat imaging study next year due to high risk of cancer recurrence with his ongoing smoking habits

## 2017-01-12 NOTE — Telephone Encounter (Signed)
-----   Message from Heath Lark, MD sent at 01/12/2017 11:41 AM EDT ----- Regarding: abnormal TSH I modified the dose to 100 mcg daily, sent to [place] Please let him know his iron test showed persistent iron def; recommend OTC iron tablets once a day at bed time He should get labs recheck in 3 months with PCP; if he cannot get PCP rechecked in 3 months, he needs to get labs scheduled here (discussed with wife & patient today). ----- Message ----- From: Interface, Lab In Three Zero One Sent: 01/12/2017   8:58 AM To: Heath Lark, MD

## 2017-01-12 NOTE — Assessment & Plan Note (Signed)
The patient have history of ischemic cardiomyopathy He is on medical management His wife requested referral to see cardiologist and I have sent referral per patient's wife request

## 2017-01-12 NOTE — Assessment & Plan Note (Signed)
He had recent bug bite with skin infection I recommend antibiotic treatment for 7 days

## 2017-01-12 NOTE — Telephone Encounter (Signed)
Notified of message below

## 2017-01-12 NOTE — Assessment & Plan Note (Signed)
He has persistent iron deficiency anemia Recent EGD and colonoscopy revealed hemorrhoids I recommend oral iron supplement daily

## 2017-01-14 ENCOUNTER — Telehealth: Payer: Self-pay | Admitting: *Deleted

## 2017-01-14 NOTE — Telephone Encounter (Signed)
Referral called to Dr Shanon Brow Harding's office

## 2017-01-21 ENCOUNTER — Other Ambulatory Visit: Payer: Self-pay | Admitting: Dermatology

## 2017-01-21 ENCOUNTER — Ambulatory Visit
Admission: RE | Admit: 2017-01-21 | Discharge: 2017-01-21 | Disposition: A | Discharge status: Home / Self Care | Payer: Medicare Other | Source: Ambulatory Visit | Attending: Dermatology | Admitting: Dermatology

## 2017-01-21 DIAGNOSIS — I8000 Phlebitis and thrombophlebitis of superficial vessels of unspecified lower extremity: Secondary | ICD-10-CM

## 2017-01-21 DIAGNOSIS — M79652 Pain in left thigh: Secondary | ICD-10-CM | POA: Diagnosis not present

## 2017-01-22 MED FILL — CEPHALEXIN 500 MG CAPSULE: 500 | 10 days supply | Qty: 30 | Fill #0

## 2017-02-05 DIAGNOSIS — I8000 Phlebitis and thrombophlebitis of superficial vessels of unspecified lower extremity: Secondary | ICD-10-CM | POA: Diagnosis not present

## 2017-02-09 DIAGNOSIS — I8002 Phlebitis and thrombophlebitis of superficial vessels of left lower extremity: Secondary | ICD-10-CM | POA: Diagnosis not present

## 2017-02-10 DIAGNOSIS — I8312 Varicose veins of left lower extremity with inflammation: Secondary | ICD-10-CM | POA: Diagnosis not present

## 2017-02-17 DIAGNOSIS — I8002 Phlebitis and thrombophlebitis of superficial vessels of left lower extremity: Secondary | ICD-10-CM | POA: Diagnosis not present

## 2017-03-04 ENCOUNTER — Ambulatory Visit (INDEPENDENT_AMBULATORY_CARE_PROVIDER_SITE_OTHER): Payer: Medicare Other | Admitting: Cardiology

## 2017-03-04 ENCOUNTER — Encounter: Payer: Self-pay | Admitting: Cardiology

## 2017-03-04 VITALS — BP 130/82 | HR 82 | Ht 68.0 in | Wt 199.6 lb

## 2017-03-04 DIAGNOSIS — D509 Iron deficiency anemia, unspecified: Secondary | ICD-10-CM | POA: Diagnosis not present

## 2017-03-04 DIAGNOSIS — M7989 Other specified soft tissue disorders: Secondary | ICD-10-CM | POA: Insufficient documentation

## 2017-03-04 DIAGNOSIS — E785 Hyperlipidemia, unspecified: Secondary | ICD-10-CM

## 2017-03-04 DIAGNOSIS — F17200 Nicotine dependence, unspecified, uncomplicated: Secondary | ICD-10-CM

## 2017-03-04 DIAGNOSIS — I255 Ischemic cardiomyopathy: Secondary | ICD-10-CM | POA: Diagnosis not present

## 2017-03-04 DIAGNOSIS — I2510 Atherosclerotic heart disease of native coronary artery without angina pectoris: Secondary | ICD-10-CM

## 2017-03-04 DIAGNOSIS — I10 Essential (primary) hypertension: Secondary | ICD-10-CM

## 2017-03-04 DIAGNOSIS — Z9861 Coronary angioplasty status: Secondary | ICD-10-CM | POA: Diagnosis not present

## 2017-03-04 DIAGNOSIS — M79662 Pain in left lower leg: Secondary | ICD-10-CM

## 2017-03-04 NOTE — Progress Notes (Signed)
PCP: System, Pcp Not In  Clinic Note: Chief Complaint  Patient presents with  . Follow-up    Re-establish Cardiology Care  . Coronary Artery Disease    Ant MI x 2, PCI to LAD & Cx.  . Cardiomyopathy    EF ~40-45%    HPI: Trevor Griffin is a 61 y.o. male with a PMH below who presents today for establishing cardiology care at the request of Dr. Lurlean Leyden (from Heme-Onc). He has a history of anterior MI with ischemic cardiomyopathy (EF roughly 40-45%) with multiple PCI's to the LAD (4 Taxus DES May 2008) recurrent-> MI in February 2009 due to stent thrombosis treated with PTCA followed by PCI to the circumflex in 2012..   Last heart catheterization was in February 2016 demonstrating patent stents in the LAD and circumflex. - .  He was has not been seen in cardiology since that discharge.  Kwamaine Cuppett [facility] was last seen in the outpatient setting by Dr. Marigene Ehlers in September 2013 and then Truitt Merle, NP on July 28, 2012 -for preop evaluation prior to head and neck surgery for tongue cancer.  He at that time was not having any chest pain or dyspnea.  Was still working.  Recent Hospitalizations: None recently.  Most recent Studies Personally Reviewed - (if available, images/films reviewed: From Epic Chart or Care Everywhere)  Stress Echo ([facility]) 08/2012: - Global LV function is preserved with stress. The Ejection Fraction estimate is 55-60%. There is basal LV anterior wall hypokinesis. There is mid LV anterior wall hypokinesis. Negative exercise echocardiography for inducible  ischemia at target heart rate.  2D Echo October 2015 EF 45-50%.  Akinesis of the mid apical anteroseptal wall.  GR 1 DD.  Mild MR.  Cardiac Cath February 2016: No evidence of obstructive coronary disease.  Patent stents in the LAD and circumflex without restenosis.  EF ~40%.   LLE Venous duplex: 1. No evidence of DVT within the left lower extremity.  2. Examination is positive for occlusive superficial thrombophlebitis  involving the greater saphenous vein extending from the proximal thigh to the ankle. There is no extension of this superficial thrombophlebitis to the saphenofemoral junction or deep venous system of the left lower extremity.  Interval History: [name] presents today to reestablish cardiology care.  He previously has not been seen since his last cath in 2016.  At that time his stents were patent.  He has not had any recurrent anginal chest tightness or pressure with rest or exertion.  Mostly his reason for not following up with cardiology is because he has been doing so much with his lung and tongue cancer treatments that he was just tired to go into the doctor's office.  He also recently has been seen by a vein and vascular specialist for severe left leg swelling and what appears to be thrombophlebitis.  There is question about whether this should be treated with anticoagulation or not. From a cardiac standpoint besides the leg swelling on the left side with clear venous stasis changes and signs of possible phlebitis/stasis dermatitis, he really has no active symptoms.  The main cardiac symptom he has is that he is feels tired all the time.  He has no sensation of chest tightness or pressure with rest or exertion.  He does do daily walking, but nothing overly exertional since his lung resection surgery.  He does not do much walking also because of the pain in his leg from the thrombophlebitis. He denies any PND, orthopnea lower  right leg edema.  No sensation of rapid irregular heartbeats or palpitations.  No syncope /near syncope or, TIA/amaurosis fugax symptoms. No melena, hematochezia, hematuria, or epstaxis. No claudication although he does not walk very much. - .  ROS: A comprehensive was performed. Review of Systems  Constitutional: Positive for malaise/fatigue (Overall feels tired with low baseline energy level).  HENT: Negative for congestion and nosebleeds.        Difficulty speaking with  partial tongue resection  Respiratory: Negative for cough, sputum production, shortness of breath and wheezing.   Cardiovascular:       Per HPI  Gastrointestinal: Negative for blood in stool, constipation, heartburn and melena.  Genitourinary: Negative for hematuria.  Musculoskeletal: Negative for joint pain.  Neurological: Negative for dizziness and seizures.  Psychiatric/Behavioral: Negative for depression and memory loss. The patient is not nervous/anxious.   All other systems reviewed and are negative.   I have reviewed and (if needed) personally updated the patient's problem list, medications, allergies, past medical and surgical history, social and family history.   Past Medical History:  Diagnosis Date  . Acute ST elevation myocardial infarction (STEMI) involving left anterior descending (LAD) coronary artery ([place]) 2008 and 2009   5/'08 - DES x 4 to p-m LAD; 2/'09 - PTCA of  in-stent thrombosis  . Alcohol abuse   . Anxiety   . CAD S/P percutaneous coronary angioplasty 2008   a. Ant STEMI 5/08 w/ 4 Taxus DES p-m LAD (3.0 x 16,16,24,28 mm) ;  b. 2/'09:  Stopped Plavix-> Ant STEMI/stent thrombosis in LAD (PTCA - 3.8 mm), LCX 44m/d;  c. 10/12: Cath/PCI: p-m LAD stents 10-20% ISR, mLAD 40%, CFX 30-40%, mCFX 90% (Promus Premier DES 3.0 mm x 20 mm - 3.66mm ), EF 35%; d. cath 2/17/'16 no Sig CAD, patent LAD & Cx stents, EF 40-45%  . Chronic pain   . COPD (chronic obstructive pulmonary disease) ([place])   . Depression   . Fatigue    Sleep study 6/08 was negative. Sleep study 5/13 showed no OSA.  Marland Kitchen GERD (gastroesophageal reflux disease)   . Headache(784.0)    migraines  . Hx of radiation therapy 10/25/12-12/10/12    Right Tongue/ Bilateral Neck //Total Dose, 60 Gy in 30 Fractions  . Hyperlipidemia   . Hypertension   . Ischemic cardiomyopathy    a. Echo 08/20/11 with EF 50-55% (previously 35%);  b. 02/2014 Echo: EF 45-50-%, mid-apicalanteroseptal AK, Gr 1 dd, mild AI, midlly dil LA.  . Lung  cancer ([place]) dx'd 2015  . Obesity   . Tobacco abuse   . 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    Past Surgical History:  Procedure Laterality Date  . CORONARY ANGIOPLASTY WITH STENT PLACEMENT  2008-2012   a. 5/'08: Anterior STEMI: Proximal-mid LAD PCI: 4 total Taxus DES stents 3.0 mm x  16 mm, 16 mm, 24 mm and 28 mm.( post-dil 3.5 mm);; b. 2/'09: PTCA LAD in-stent thrombosis - 3.8 mm;; c. 10/'12: mCx Promus Element DES 3.0 x 20 mm- post-dil - 3.25 mm. -> patent in 2016  . FRACTURE SURGERY Right    as a child,leg  . LEFT HEART CATH AND CORONARY ANGIOGRAPHY  2006-2012   a) 2006: minimal CAD, normal EF;; b) 5/'08 - Ant STEMI - 100% p-mLAD (DES x 4 in LAD);; c)2/'09: Ant STEMI for IST/thrombosis of LAD ->PTCA, 80% mCx;; 10/'12: mCx 80-90% - DES PCI, patent LAD stents, EF ~35% - anterolateral AK, apical severe HK.  Marland Kitchen  TRANSTHORACIC ECHOCARDIOGRAM  02/2014   EF 45-50%.  Akinesis of the mid apical anteroseptal wall.  GR 1 DD.  Mild MR.   Additional Studies Reviewed -   Echo (4/09) with EF 40-50%, Mild global hypokinesis, mild LVH.   Echo (6/11): EF 40%, septal/apical akinesis, inferior hypokinesis, mild LVH.   Cardiac MRI (7/11): EF 39%, anteroseptal and mid to apical anterior akinesis, scar in the anteroseptal and the mid to apical anterior wall (likely not viable).   Echo 02/05/11: EF 35%, grade 1 diast dysfxn, AS Hypo to akinesis, Ant HK, mild LAE.   Echo (4/13) with EF 50%, septal and apical hypokinesis with mild AI.   Myoview in 4/13 calculated EF 42% (this is likely more accurate than the 50% report on the echo).   2D Echo 08/2012: EF 40-45%.  Mild LV dilation.  Anterior septal akinesis basal septal akinesis and basal anterior hypokinesis.  Mild LA dilation.  Mild to moderate AI. ANGIOGRAPHY - CATH - images reviewed.  Cardiac Cath January 2006: Mild diffuse disease  Anterior STEMI -- >Ccardiac Cath Sep 21, 2006 (Dr. Melvern Banker): Anterior STEMI: Proximal-mid LAD PCI: 4  total Taxus DES stents 3.0 mm x  16 mm, 16 mm, 24 mm and 28 mm.  Anterior STEMI -- >Cardiac Cath Feb 2008 (Dr. Einar Gip): AV groove circumflex 80% focal lesion after OM 2 -with 2 downstream PDA and PLA branches.  70% thrombotic occlusion of LAD stents --> PTCA with 3.5 mm balloon.  (Unstable Angina) cardiac Cath October 2012: Mid Circumflex PCI -Promus element DES 3.25 mm x 20 mm -postdilated 3.25 mm).  Patent LAD stents.  LV gram with anterior lateral akinesis and apical severe hypokinesis with a EF of 35%.   Current Meds  Medication Sig  . acetaminophen (TYLENOL) 500 MG tablet Take 1,000 mg by mouth every 8 (eight) hours as needed (pain).  Marland Kitchen aspirin 81 MG tablet Take 1 tablet (81 mg total) by mouth daily.  Marland Kitchen atorvastatin (LIPITOR) 80 MG tablet Take 1 tablet (80 mg total) by mouth daily.  Marland Kitchen buPROPion (WELLBUTRIN SR) 150 MG 12 hr tablet Take 1 tablet (150 mg total) by mouth 2 (two) times daily.  . cephALEXin (KEFLEX) 500 MG capsule Take 1 capsule (500 mg total) by mouth 3 (three) times daily.  . fluconazole (DIFLUCAN) 100 MG tablet Take 1 tablet (100 mg total) by mouth daily.  Marland Kitchen levothyroxine (SYNTHROID, LEVOTHROID) 100 MCG tablet Take 1 tablet (100 mcg total) by mouth daily before breakfast.  . metoprolol tartrate (LOPRESSOR) 25 MG tablet Take 0.5 tablets (12.5 mg total) by mouth 2 (two) times daily.  . Multiple Vitamins-Minerals (MULTIVITAMIN WITH MINERALS) tablet Take 1 tablet by mouth daily.    . nitroGLYCERIN (NITROSTAT) 0.4 MG SL tablet Place 1 tablet (0.4 mg total) under the tongue every 5 (five) minutes as needed (up to 3 doses). For chest pain  . omeprazole (PRILOSEC) 20 MG capsule Take 1 capsule (20 mg total) by mouth daily.    Allergies  Allergen Reactions  . Hydromorphone Nausea Only and Other (See Comments)    Headaches also  . Nubain [Nalbuphine Hcl] Nausea Only and Other (See Comments)    Headaches also    Social History   Socioeconomic History  . Marital status: Married      Spouse name: None  . Number of children: None  . Years of education: None  . Highest education level: None  Social Needs  . Financial resource strain: None  . Food insecurity - worry:  None  . Food insecurity - inability: None  . Transportation needs - medical: None  . Transportation needs - non-medical: None  Occupational History  . Occupation: Disabled  Tobacco Use  . Smoking status: Current Every Day Smoker    Packs/day: 0.50    Years: 40.00    Pack years: 20.00    Types: Cigars  . Smokeless tobacco: Never Used  . Tobacco comment: Stopped smoking cigars day before surgery, 06/09/14 smoking 1/2 pack cigars daily, 06/29/15; currently smokes 3/4 ppd of cigars (as of 02/14/16)-gwd  Substance and Sexual Activity  . Alcohol use: Yes    Comment: occ  . Drug use: No  . Sexual activity: Yes  Other Topics Concern  . None  Social History Narrative   Alcoholic beverage:  Yes   Drug use:  No   Seatbead Use: Yes   Firearms in home:  Yes   Exercise:  No   Smoke Alarm in your home: Yes    family history includes Cancer in his sister; Heart attack in his brother; Kidney disease in his mother; Liver disease in his sister.  Wt Readings from Last 3 Encounters:  03/04/17 199 lb 9.6 oz (90.5 kg)  01/12/17 198 lb 9.6 oz (90.1 kg)  11/25/16 196 lb (88.9 kg)    PHYSICAL EXAM BP 130/82   Pulse 82   Ht 5\' 8"  (1.727 m)   Wt 199 lb 9.6 oz (90.5 kg)   SpO2 94%   BMI 30.35 kg/m  Physical Exam  Constitutional: He is oriented to person, place, and time. He appears well-developed and well-nourished. No distress.  General he appears to b mildly chronically ill.  HENT:  Head: Normocephalic and atraumatic.  Mouth/Throat: Oropharynx is clear and moist. No oropharyngeal exudate.  Post surgical changes noted on the right neck.  Difficult speech  Eyes: EOM are normal. Pupils are equal, round, and reactive to light.  Neck: Normal range of motion. Neck supple. No hepatojugular reflux and no JVD  present. Carotid bruit is not present.  Cardiovascular: Normal rate and regular rhythm.  No extrasystoles are present. PMI is not displaced. Exam reveals decreased pulses (Difficult to palpate pulses on the left foot due to swelling and erythema.). Exam reveals no gallop, no distant heart sounds and no friction rub.  Murmur heard.  Medium-pitched crescendo-decrescendo early systolic murmur is present with a grade of 2/6 at the upper right sternal border. Pulmonary/Chest: Effort normal and breath sounds normal. No respiratory distress. He has no wheezes. He has no rales.  Abdominal: Soft. Bowel sounds are normal. He exhibits no distension. There is no tenderness. There is no rebound.  Musculoskeletal: Normal range of motion. He exhibits edema (Tenderness nonpitting swelling of the left leg from just above the knee down to the foot.  There is some evidence of venous stasis as well as tenderness suggesting thrombophlebitis.  Mild varicose changes noted as well.).  Neurological: He is alert and oriented to person, place, and time. No cranial nerve deficit (Full cranial nerve evaluation was difficult due to partial tongue resection, but most cranial nerves were grossly intact.).  Skin: Skin is warm and dry. No rash noted. No erythema.  The entire left leg is erythematous with vascular changes.  Otherwise normal skin.  Psychiatric: He has a normal mood and affect. His behavior is normal. Judgment and thought content normal.    Adult ECG Report  Rate: 82;  Rhythm: normal sinus rhythm and 1 degree AVB.  Otherwise normal axis, intervals and  durations.;   Narrative Interpretation: Relatively normal EKG   Other studies Reviewed: Additional studies/ records that were reviewed today include:  Recent Labs:  Ordered today Lab Results  Component Value Date   CHOL 127 03/04/2017   HDL 35 (L) 03/04/2017   LDLCALC 81 03/04/2017   LDLDIRECT 148.4 10/18/2009   TRIG 56 03/04/2017   CHOLHDL 3.6 03/04/2017    Lab Results  Component Value Date   CREATININE 0.98 03/04/2017   BUN 10 03/04/2017   NA 137 03/04/2017   K 4.8 03/04/2017   CL 96 03/04/2017   CO2 24 03/04/2017   CBC Latest Ref Rng & Units 03/04/2017 01/12/2017 10/13/2016  WBC 3.4 - 10.8 x10E3/uL 6.2 4.0 4.9  Hemoglobin 13.0 - 17.7 g/dL 10.4(L) 11.4(L) 11.0(L)  Hematocrit 37.5 - 51.0 % 33.6(L) 38.2(L) 35.5(L)  Platelets 150 - 379 x10E3/uL 225 163 153   Lab Results  Component Value Date   TSH 5.096 (H) 01/12/2017   - down from 14.1 in June.  ASSESSMENT / PLAN: Problem List Items Addressed This Visit    CAD S/P multiple PCIs (Chronic)    Overall doing well without any active anginal symptoms.  Last catheterization was in 2016.  He has not had any ischemic evaluation since then.  He is no longer on Plavix, but is still on aspirin and statin.  He is on low-dose beta-blocker which I would not want to titrate up based on his fatigue level.  Would simply continue current medications at this time.      Relevant Orders   Comprehensive metabolic panel (Completed)   CBC (Completed)   EKG 12-Lead (Completed)   Cardiomyopathy, ischemic-EF 45-50% by 2D Oct 2015 - Primary (Chronic)    Mild ischemic cardiomyopathy without any active heart failure symptoms.  He does not require any diuretic.  The swelling he has in the left leg is related to.  Superficial venous thrombophlebitis  He is only on low-dose beta-blocker, but with current blood pressure, I do not see any need to further titrate.      Relevant Orders   Lipid panel (Completed)   Comprehensive metabolic panel (Completed)   CBC (Completed)   EKG 12-Lead (Completed)   Dyslipidemia (Chronic)    Goal LDL should be less than 70.  He has not had lipids checked since 2013 according to our records. Plan will be to go ahead and check lipids today, he is not eaten yet.  Continue current dose of atorvastatin -review of labs from clinic today reveal LDL of 81.  Close to goal, but not  quite there.  Will reassess in 6 months after he is worked on some dietary modification.  If still not at goal, will need to consider adding Zetia      Relevant Orders   Lipid panel (Completed)   Essential hypertension (Chronic)    Stable blood pressure at present.  If additional blood pressure control be needed, would consider ARB.      Microcytic anemia (Chronic)    Known microcytic anemia with chronic fatigue symptoms.  While we are checking his baseline chemistry levels and lipid panel we can check a CBC as well.      Relevant Orders   CBC (Completed)   EKG 12-Lead (Completed)   Pain and swelling of lower leg, left    He has significant painful swelling of the left leg that is probably related to thrombophlebitis.  He has been evaluated by the vascular team for superficial GS V thrombus.  There is question about the possible use of a diuretic.  I will review with our vascular surgeons to determine if there is any clear cut guidelines based on this.  Impression would be that standard of care is not to anticoagulate.      Relevant Orders   Comprehensive metabolic panel (Completed)   SMOKER (Chronic)    .  I did not spend much time talking to him about smoking cessation counseling, mostly because today's visit was getting to know him as a patient.  I did mention the importance of smoking cessation and recommended cessation.  Unfortunately, I would imagine that if he is not quit after 2 anterior heart attacks and 2 different types of cancer related to smoking, I am not sure if he would be willing to quit now.         Mr. Rolland Porter is technically a follow-up patient since he was seen in the hospital in 26 has not been seen in clinic since 2014.  There are multiple studies that had to be reviewed to understand his baseline cardiac history.  Total time with the patient was 40+ minutes, then chart review was an additional 40+ minutes.  Current medicines are reviewed at length with the  patient today. (+/- concerns) n/a The following changes have been made: n/a  Patient Instructions  Labs - [facility] DO NOT EAT OR DRINK THE MORNING OF THE TEST.   Your physician wants you to follow-up in [place]. You will receive a reminder letter in the mail two months in advance. If you don't receive a letter, please call our office to schedule the follow-up appointment.    Studies Ordered:   Orders Placed This Encounter  Procedures  . Lipid panel  . Comprehensive metabolic panel  . CBC  . EKG 12-Lead      Glenetta Hew, M.D., M.S. Interventional Cardiologist   Pager # [phone] Phone # [phone] [address] [place], [place] [address]

## 2017-03-04 NOTE — Patient Instructions (Addendum)
Labs - [facility] DO NOT EAT OR DRINK THE MORNING OF THE TEST.   Your physician wants you to follow-up in [place]. You will receive a reminder letter in the mail two months in advance. If you don't receive a letter, please call our office to schedule the follow-up appointment.

## 2017-03-05 LAB — COMPREHENSIVE METABOLIC PANEL
ALT: 13 IU/L (ref 0–44)
AST: 20 IU/L (ref 0–40)
Albumin/Globulin Ratio: 1.4 (ref 1.2–2.2)
Albumin: 4.2 g/dL (ref 3.6–4.8)
Alkaline Phosphatase: 93 IU/L (ref 39–117)
BUN/Creatinine Ratio: 10 (ref 10–24)
BUN: 10 mg/dL (ref 8–27)
Bilirubin Total: 0.9 mg/dL (ref 0.0–1.2)
CALCIUM: 9 mg/dL (ref 8.6–10.2)
CO2: 24 mmol/L (ref 20–29)
CREATININE: 0.98 mg/dL (ref 0.76–1.27)
Chloride: 96 mmol/L (ref 96–106)
GFR, EST AFRICAN AMERICAN: 96 mL/min/{1.73_m2} (ref >59)
GFR, EST NON AFRICAN AMERICAN: 83 mL/min/{1.73_m2} (ref >59)
Globulin, Total: 2.9 g/dL (ref 1.5–4.5)
Glucose: 89 mg/dL (ref 65–99)
Potassium: 4.8 mmol/L (ref 3.5–5.2)
Sodium: 137 mmol/L (ref 134–144)
TOTAL PROTEIN: 7.1 g/dL (ref 6.0–8.5)

## 2017-03-05 LAB — LIPID PANEL
CHOL/HDL RATIO: 3.6 ratio (ref 0.0–5.0)
Cholesterol, Total: 127 mg/dL (ref 100–199)
HDL: 35 mg/dL — ABNORMAL LOW (ref >39)
LDL CALC: 81 mg/dL (ref 0–99)
TRIGLYCERIDES: 56 mg/dL (ref 0–149)
VLDL Cholesterol Cal: 11 mg/dL (ref 5–40)

## 2017-03-05 LAB — CBC
HEMATOCRIT: 33.6 % — AB (ref 37.5–51.0)
HEMOGLOBIN: 10.4 g/dL — AB (ref 13.0–17.7)
MCH: 25.1 pg — AB (ref 26.6–33.0)
MCHC: 31 g/dL — AB (ref 31.5–35.7)
MCV: 81 fL (ref 79–97)
PLATELETS: 225 10*3/uL (ref 150–379)
RBC: 4.14 x10E6/uL (ref 4.14–5.80)
RDW: 17.5 % — AB (ref 12.3–15.4)
WBC: 6.2 10*3/uL (ref 3.4–10.8)

## 2017-03-08 ENCOUNTER — Encounter: Payer: Self-pay | Admitting: Cardiology

## 2017-03-08 NOTE — Assessment & Plan Note (Signed)
.    I did not spend much time talking to him about smoking cessation counseling, mostly because today's visit was getting to know him as a patient.  I did mention the importance of smoking cessation and recommended cessation.  Unfortunately, I would imagine that if he is not quit after 2 anterior heart attacks and 2 different types of cancer related to smoking, I am not sure if he would be willing to quit now.

## 2017-03-08 NOTE — Assessment & Plan Note (Signed)
Stable blood pressure at present.  If additional blood pressure control be needed, would consider ARB.

## 2017-03-08 NOTE — Assessment & Plan Note (Signed)
Overall doing well without any active anginal symptoms.  Last catheterization was in 2016.  He has not had any ischemic evaluation since then.  He is no longer on Plavix, but is still on aspirin and statin.  He is on low-dose beta-blocker which I would not want to titrate up based on his fatigue level.  Would simply continue current medications at this time.

## 2017-03-08 NOTE — Assessment & Plan Note (Signed)
Mild ischemic cardiomyopathy without any active heart failure symptoms.  He does not require any diuretic.  The swelling he has in the left leg is related to.  Superficial venous thrombophlebitis  He is only on low-dose beta-blocker, but with current blood pressure, I do not see any need to further titrate.

## 2017-03-08 NOTE — Assessment & Plan Note (Signed)
Known microcytic anemia with chronic fatigue symptoms.  While we are checking his baseline chemistry levels and lipid panel we can check a CBC as well.

## 2017-03-08 NOTE — Assessment & Plan Note (Signed)
He has significant painful swelling of the left leg that is probably related to thrombophlebitis.  He has been evaluated by the vascular team for superficial GS V thrombus.  There is question about the possible use of a diuretic.  I will review with our vascular surgeons to determine if there is any clear cut guidelines based on this.  Impression would be that standard of care is not to anticoagulate.

## 2017-03-08 NOTE — Assessment & Plan Note (Addendum)
Goal LDL should be less than 70.  He has not had lipids checked since 2013 according to our records. Plan will be to go ahead and check lipids today, he is not eaten yet.  Continue current dose of atorvastatin -review of labs from clinic today reveal LDL of 81.  Close to goal, but not quite there.  Will reassess in 6 months after he is worked on some dietary modification.  If still not at goal, will need to consider adding Zetia

## 2017-03-13 ENCOUNTER — Telehealth: Payer: Self-pay | Admitting: Cardiology

## 2017-03-13 NOTE — Telephone Encounter (Signed)
Returned call to wife (ok per dpr)-wondering about patients lab results-advised prelim is stable but Dr. Ellyn Hack needs to review.   Wife also states that patient is in a lot of pain with his leg.  States she called vein and vascular center and he is scheduled for [place] on Monday.  States he is in a lot of pain, almost unable to ambulate due to leg pain.   Wife states they will not prescribe him any pain medication and he does not have a PCP.   Advised that our cardiologist typically do not prescribe pain medication and this is usually referred to a PCP or specialist.   Wife understands but is very frustrated because "no one will give him anything",  request I ask Dr. Ellyn Hack anyway.  Encouraged to get established with a PCP as this is a very important part of overseeing patients care.   Wife verbalized understanding.     Routed to Dr. Ellyn Hack for review/further recommendations.     OV 10/31: Pain and swelling of lower leg, left     He has significant painful swelling of the left leg that is probably related to thrombophlebitis.  He has been evaluated by the vascular team for superficial GS V thrombus.  There is question about the possible use of a diuretic.  I will review with our vascular surgeons to determine if there is any clear cut guidelines based on this.  Impression would be that standard of care is not to anticoagulate.

## 2017-03-13 NOTE — Telephone Encounter (Signed)
She wants to know pt lab results from 03-04-17 please. She says the pt is having a lot of pain in his leg,she wants to know if Dr Ellyn Hack would please call him something in for pain please.

## 2017-03-16 ENCOUNTER — Emergency Department (HOSPITAL_COMMUNITY): Admit: 2017-03-16 | Discharge: 2017-03-16 | Disposition: A | Discharge status: Home / Self Care | Payer: Medicare Other

## 2017-03-16 ENCOUNTER — Inpatient Hospital Stay (HOSPITAL_COMMUNITY): Payer: Medicare Other

## 2017-03-16 ENCOUNTER — Encounter (HOSPITAL_COMMUNITY): Payer: Self-pay | Admitting: *Deleted

## 2017-03-16 ENCOUNTER — Other Ambulatory Visit: Payer: Self-pay

## 2017-03-16 ENCOUNTER — Inpatient Hospital Stay (HOSPITAL_COMMUNITY)
Admission: EM | Admit: 2017-03-16 | Discharge: 2017-03-22 | DRG: 602 | Disposition: A | Discharge status: Home Health | Payer: Medicare Other | Attending: Internal Medicine | Admitting: Internal Medicine

## 2017-03-16 ENCOUNTER — Emergency Department (HOSPITAL_COMMUNITY): Payer: Medicare Other

## 2017-03-16 DIAGNOSIS — Z7982 Long term (current) use of aspirin: Secondary | ICD-10-CM

## 2017-03-16 DIAGNOSIS — I252 Old myocardial infarction: Secondary | ICD-10-CM | POA: Diagnosis not present

## 2017-03-16 DIAGNOSIS — Z85810 Personal history of malignant neoplasm of tongue: Secondary | ICD-10-CM

## 2017-03-16 DIAGNOSIS — I110 Hypertensive heart disease with heart failure: Secondary | ICD-10-CM | POA: Diagnosis present

## 2017-03-16 DIAGNOSIS — R52 Pain, unspecified: Secondary | ICD-10-CM | POA: Diagnosis not present

## 2017-03-16 DIAGNOSIS — R7982 Elevated C-reactive protein (CRP): Secondary | ICD-10-CM | POA: Diagnosis present

## 2017-03-16 DIAGNOSIS — M79606 Pain in leg, unspecified: Secondary | ICD-10-CM | POA: Diagnosis not present

## 2017-03-16 DIAGNOSIS — Z9861 Coronary angioplasty status: Secondary | ICD-10-CM

## 2017-03-16 DIAGNOSIS — J449 Chronic obstructive pulmonary disease, unspecified: Secondary | ICD-10-CM | POA: Diagnosis present

## 2017-03-16 DIAGNOSIS — L0390 Cellulitis, unspecified: Secondary | ICD-10-CM

## 2017-03-16 DIAGNOSIS — Z923 Personal history of irradiation: Secondary | ICD-10-CM | POA: Diagnosis not present

## 2017-03-16 DIAGNOSIS — L03116 Cellulitis of left lower limb: Principal | ICD-10-CM | POA: Diagnosis present

## 2017-03-16 DIAGNOSIS — I10 Essential (primary) hypertension: Secondary | ICD-10-CM | POA: Diagnosis present

## 2017-03-16 DIAGNOSIS — Z8249 Family history of ischemic heart disease and other diseases of the circulatory system: Secondary | ICD-10-CM

## 2017-03-16 DIAGNOSIS — F17290 Nicotine dependence, other tobacco product, uncomplicated: Secondary | ICD-10-CM | POA: Diagnosis present

## 2017-03-16 DIAGNOSIS — Z85118 Personal history of other malignant neoplasm of bronchus and lung: Secondary | ICD-10-CM

## 2017-03-16 DIAGNOSIS — Z902 Acquired absence of lung [part of]: Secondary | ICD-10-CM | POA: Diagnosis not present

## 2017-03-16 DIAGNOSIS — I2510 Atherosclerotic heart disease of native coronary artery without angina pectoris: Secondary | ICD-10-CM

## 2017-03-16 DIAGNOSIS — I255 Ischemic cardiomyopathy: Secondary | ICD-10-CM | POA: Diagnosis present

## 2017-03-16 DIAGNOSIS — Z79899 Other long term (current) drug therapy: Secondary | ICD-10-CM | POA: Diagnosis not present

## 2017-03-16 DIAGNOSIS — I700 Atherosclerosis of aorta: Secondary | ICD-10-CM | POA: Diagnosis not present

## 2017-03-16 DIAGNOSIS — C023 Malignant neoplasm of anterior two-thirds of tongue, part unspecified: Secondary | ICD-10-CM | POA: Diagnosis present

## 2017-03-16 DIAGNOSIS — K219 Gastro-esophageal reflux disease without esophagitis: Secondary | ICD-10-CM | POA: Diagnosis present

## 2017-03-16 DIAGNOSIS — Z955 Presence of coronary angioplasty implant and graft: Secondary | ICD-10-CM | POA: Diagnosis not present

## 2017-03-16 DIAGNOSIS — J189 Pneumonia, unspecified organism: Secondary | ICD-10-CM | POA: Diagnosis present

## 2017-03-16 DIAGNOSIS — I82812 Embolism and thrombosis of superficial veins of left lower extremities: Secondary | ICD-10-CM | POA: Diagnosis present

## 2017-03-16 DIAGNOSIS — M7989 Other specified soft tissue disorders: Secondary | ICD-10-CM | POA: Diagnosis not present

## 2017-03-16 DIAGNOSIS — K5900 Constipation, unspecified: Secondary | ICD-10-CM | POA: Diagnosis present

## 2017-03-16 DIAGNOSIS — E785 Hyperlipidemia, unspecified: Secondary | ICD-10-CM | POA: Diagnosis present

## 2017-03-16 DIAGNOSIS — I5022 Chronic systolic (congestive) heart failure: Secondary | ICD-10-CM | POA: Diagnosis present

## 2017-03-16 DIAGNOSIS — F39 Unspecified mood [affective] disorder: Secondary | ICD-10-CM | POA: Diagnosis present

## 2017-03-16 DIAGNOSIS — R0602 Shortness of breath: Secondary | ICD-10-CM | POA: Diagnosis not present

## 2017-03-16 DIAGNOSIS — M79609 Pain in unspecified limb: Secondary | ICD-10-CM | POA: Diagnosis not present

## 2017-03-16 DIAGNOSIS — J440 Chronic obstructive pulmonary disease with acute lower respiratory infection: Secondary | ICD-10-CM | POA: Diagnosis present

## 2017-03-16 DIAGNOSIS — D509 Iron deficiency anemia, unspecified: Secondary | ICD-10-CM | POA: Diagnosis not present

## 2017-03-16 DIAGNOSIS — E039 Hypothyroidism, unspecified: Secondary | ICD-10-CM | POA: Diagnosis not present

## 2017-03-16 DIAGNOSIS — F1010 Alcohol abuse, uncomplicated: Secondary | ICD-10-CM | POA: Diagnosis present

## 2017-03-16 DIAGNOSIS — M7120 Synovial cyst of popliteal space [Baker], unspecified knee: Secondary | ICD-10-CM

## 2017-03-16 DIAGNOSIS — Z841 Family history of disorders of kidney and ureter: Secondary | ICD-10-CM

## 2017-03-16 LAB — CBC
HCT: 28.2 % — ABNORMAL LOW (ref 39.0–52.0)
Hemoglobin: 8.6 g/dL — ABNORMAL LOW (ref 13.0–17.0)
MCH: 25.4 pg — AB (ref 26.0–34.0)
MCHC: 30.5 g/dL (ref 30.0–36.0)
MCV: 83.2 fL (ref 78.0–100.0)
Platelets: 194 10*3/uL (ref 150–400)
RBC: 3.39 MIL/uL — AB (ref 4.22–5.81)
RDW: 18.1 % — ABNORMAL HIGH (ref 11.5–15.5)
WBC: 3.6 10*3/uL — ABNORMAL LOW (ref 4.0–10.5)

## 2017-03-16 LAB — COMPREHENSIVE METABOLIC PANEL
ALK PHOS: 73 U/L (ref 38–126)
ALT: 12 U/L — AB (ref 17–63)
ANION GAP: 7 (ref 5–15)
AST: 19 U/L (ref 15–41)
Albumin: 3.3 g/dL — ABNORMAL LOW (ref 3.5–5.0)
BILIRUBIN TOTAL: 1.2 mg/dL (ref 0.3–1.2)
BUN: 9 mg/dL (ref 6–20)
CALCIUM: 8.8 mg/dL — AB (ref 8.9–10.3)
CO2: 24 mmol/L (ref 22–32)
CREATININE: 1.09 mg/dL (ref 0.61–1.24)
Chloride: 97 mmol/L — ABNORMAL LOW (ref 101–111)
Glucose, Bld: 111 mg/dL — ABNORMAL HIGH (ref 65–99)
Potassium: 4.3 mmol/L (ref 3.5–5.1)
SODIUM: 128 mmol/L — AB (ref 135–145)
TOTAL PROTEIN: 7.1 g/dL (ref 6.5–8.1)

## 2017-03-16 LAB — BASIC METABOLIC PANEL
Anion gap: 9 (ref 5–15)
BUN: 7 mg/dL (ref 6–20)
CHLORIDE: 100 mmol/L — AB (ref 101–111)
CO2: 21 mmol/L — AB (ref 22–32)
CREATININE: 0.99 mg/dL (ref 0.61–1.24)
Calcium: 8.4 mg/dL — ABNORMAL LOW (ref 8.9–10.3)
GFR calc non Af Amer: >60 mL/min (ref >60)
GLUCOSE: 86 mg/dL (ref 65–99)
Potassium: 4.1 mmol/L (ref 3.5–5.1)
Sodium: 130 mmol/L — ABNORMAL LOW (ref 135–145)

## 2017-03-16 LAB — CBC WITH DIFFERENTIAL/PLATELET
BASOS ABS: 0 10*3/uL (ref 0.0–0.1)
BASOS PCT: 0 %
EOS ABS: 0.1 10*3/uL (ref 0.0–0.7)
Eosinophils Relative: 2 %
HEMATOCRIT: 30.1 % — AB (ref 39.0–52.0)
HEMOGLOBIN: 9.2 g/dL — AB (ref 13.0–17.0)
Lymphocytes Relative: 10 %
Lymphs Abs: 0.5 10*3/uL — ABNORMAL LOW (ref 0.7–4.0)
MCH: 25.6 pg — ABNORMAL LOW (ref 26.0–34.0)
MCHC: 30.6 g/dL (ref 30.0–36.0)
MCV: 83.6 fL (ref 78.0–100.0)
MONOS PCT: 13 %
Monocytes Absolute: 0.6 10*3/uL (ref 0.1–1.0)
NEUTROS ABS: 3.6 10*3/uL (ref 1.7–7.7)
NEUTROS PCT: 75 %
Platelets: 228 10*3/uL (ref 150–400)
RBC: 3.6 MIL/uL — ABNORMAL LOW (ref 4.22–5.81)
RDW: 18.4 % — ABNORMAL HIGH (ref 11.5–15.5)
WBC: 4.7 10*3/uL (ref 4.0–10.5)

## 2017-03-16 LAB — SEDIMENTATION RATE: SED RATE: 40 mm/h — AB (ref 0–16)

## 2017-03-16 LAB — I-STAT CG4 LACTIC ACID, ED: Lactic Acid, Venous: 1.33 mmol/L (ref 0.5–1.9)

## 2017-03-16 LAB — I-STAT TROPONIN, ED: Troponin i, poc: 0 ng/mL (ref 0.00–0.08)

## 2017-03-16 LAB — T4, FREE: Free T4: 0.87 ng/dL (ref 0.61–1.12)

## 2017-03-16 LAB — CK: Total CK: 115 U/L (ref 49–397)

## 2017-03-16 LAB — RETICULOCYTES
RBC.: 3.39 MIL/uL — AB (ref 4.22–5.81)
RETIC COUNT ABSOLUTE: 111.9 10*3/uL (ref 19.0–186.0)
RETIC CT PCT: 3.3 % — AB (ref 0.4–3.1)

## 2017-03-16 LAB — TSH: TSH: 23.039 u[IU]/mL — ABNORMAL HIGH (ref 0.350–4.500)

## 2017-03-16 LAB — C-REACTIVE PROTEIN: CRP: 8.2 mg/dL — ABNORMAL HIGH (ref <1.0)

## 2017-03-16 LAB — TROPONIN I

## 2017-03-16 MED ORDER — VANCOMYCIN HCL IN DEXTROSE 1-5 GM/200ML-% IV SOLN: 1000.0000 mg | Freq: Once | INTRAVENOUS | Status: DC

## 2017-03-16 MED ORDER — SODIUM CHLORIDE 0.9 % IV SOLN
1500.0000 mg | Freq: Once | INTRAVENOUS | Status: AC
  Administered 2017-03-16: 1500 mg via INTRAVENOUS
  Filled 2017-03-16: qty 1500

## 2017-03-16 MED ORDER — SODIUM CHLORIDE 0.9 % IV BOLUS (SEPSIS)
1000.0000 mL | Freq: Once | INTRAVENOUS | Status: AC
  Administered 2017-03-16: 1000 mL via INTRAVENOUS

## 2017-03-16 MED ORDER — DEXTROSE 5 % IV SOLN
1.0000 g | INTRAVENOUS | Status: DC
  Administered 2017-03-16 – 2017-03-19 (×4): 1 g via INTRAVENOUS
  Filled 2017-03-16 (×4): qty 10

## 2017-03-16 MED ORDER — GUAIFENESIN ER 600 MG PO TB12
600.0000 mg | ORAL_TABLET | Freq: Two times a day (BID) | ORAL | Status: DC
  Administered 2017-03-16 – 2017-03-22 (×12): 600 mg via ORAL
  Filled 2017-03-16 (×12): qty 1

## 2017-03-16 MED ORDER — LEVOTHYROXINE SODIUM 100 MCG PO TABS
100.0000 ug | ORAL_TABLET | Freq: Every day | ORAL | Status: DC
  Administered 2017-03-17 – 2017-03-22 (×6): 100 ug via ORAL
  Filled 2017-03-16 (×6): qty 1

## 2017-03-16 MED ORDER — MORPHINE SULFATE (PF) 4 MG/ML IV SOLN
4.0000 mg | Freq: Once | INTRAVENOUS | Status: AC
  Administered 2017-03-16: 4 mg via INTRAVENOUS
  Filled 2017-03-16: qty 1

## 2017-03-16 MED ORDER — PIPERACILLIN-TAZOBACTAM 3.375 G IVPB 30 MIN
3.3750 g | Freq: Once | INTRAVENOUS | Status: DC
  Administered 2017-03-16: 3.375 g via INTRAVENOUS
  Filled 2017-03-16: qty 50

## 2017-03-16 MED ORDER — ACETAMINOPHEN 325 MG PO TABS: 650.0000 mg | ORAL_TABLET | Freq: Four times a day (QID) | ORAL | Status: DC | PRN

## 2017-03-16 MED ORDER — DOCUSATE SODIUM 100 MG PO CAPS
100.0000 mg | ORAL_CAPSULE | Freq: Two times a day (BID) | ORAL | Status: DC
  Administered 2017-03-16 – 2017-03-22 (×12): 100 mg via ORAL
  Filled 2017-03-16 (×12): qty 1

## 2017-03-16 MED ORDER — PANTOPRAZOLE SODIUM 40 MG PO TBEC
40.0000 mg | DELAYED_RELEASE_TABLET | Freq: Every day | ORAL | Status: DC
  Administered 2017-03-16 – 2017-03-22 (×7): 40 mg via ORAL
  Filled 2017-03-16 (×7): qty 1

## 2017-03-16 MED ORDER — HYDROCODONE-ACETAMINOPHEN 5-325 MG PO TABS
1.0000 | ORAL_TABLET | ORAL | Status: DC | PRN
  Administered 2017-03-16 – 2017-03-18 (×9): 2 via ORAL
  Administered 2017-03-18: 1 via ORAL
  Administered 2017-03-18 – 2017-03-22 (×16): 2 via ORAL
  Filled 2017-03-16 (×26): qty 2

## 2017-03-16 MED ORDER — ASPIRIN 81 MG PO TABS: 81.0000 mg | ORAL_TABLET | Freq: Every day | ORAL | Status: DC

## 2017-03-16 MED ORDER — BUPROPION HCL ER (SR) 150 MG PO TB12
150.0000 mg | ORAL_TABLET | Freq: Two times a day (BID) | ORAL | Status: DC
  Administered 2017-03-16 – 2017-03-22 (×12): 150 mg via ORAL
  Filled 2017-03-16 (×15): qty 1

## 2017-03-16 MED ORDER — MENTHOL 3 MG MT LOZG: 1.0000 | LOZENGE | OROMUCOSAL | Status: DC | PRN

## 2017-03-16 MED ORDER — METHOCARBAMOL 500 MG PO TABS: 500.0000 mg | ORAL_TABLET | Freq: Three times a day (TID) | ORAL | Status: DC | PRN

## 2017-03-16 MED ORDER — ACETAMINOPHEN 650 MG RE SUPP: 650.0000 mg | Freq: Four times a day (QID) | RECTAL | Status: DC | PRN

## 2017-03-16 MED ORDER — ONDANSETRON HCL 4 MG PO TABS
4.0000 mg | ORAL_TABLET | Freq: Four times a day (QID) | ORAL | Status: DC | PRN
  Administered 2017-03-19 – 2017-03-22 (×4): 4 mg via ORAL
  Filled 2017-03-16 (×4): qty 1

## 2017-03-16 MED ORDER — ENOXAPARIN SODIUM 40 MG/0.4ML ~~LOC~~ SOLN
40.0000 mg | SUBCUTANEOUS | Status: DC
  Administered 2017-03-17: 40 mg via SUBCUTANEOUS
  Filled 2017-03-16: qty 0.4

## 2017-03-16 MED ORDER — POLYETHYLENE GLYCOL 3350 17 G PO PACK: 17.0000 g | PACK | Freq: Every day | ORAL | Status: DC | PRN

## 2017-03-16 MED ORDER — IOPAMIDOL (ISOVUE-370) INJECTION 76%
INTRAVENOUS | Status: AC
  Filled 2017-03-16: qty 100

## 2017-03-16 MED ORDER — PIPERACILLIN-TAZOBACTAM 3.375 G IVPB: 3.3750 g | Freq: Three times a day (TID) | INTRAVENOUS | Status: DC

## 2017-03-16 MED ORDER — VANCOMYCIN HCL IN DEXTROSE 1-5 GM/200ML-% IV SOLN
1000.0000 mg | Freq: Two times a day (BID) | INTRAVENOUS | Status: DC
  Administered 2017-03-17 – 2017-03-20 (×7): 1000 mg via INTRAVENOUS
  Filled 2017-03-16 (×7): qty 200

## 2017-03-16 MED ORDER — ASPIRIN 81 MG PO CHEW
81.0000 mg | CHEWABLE_TABLET | Freq: Every day | ORAL | Status: DC
  Administered 2017-03-17 – 2017-03-22 (×6): 81 mg via ORAL
  Filled 2017-03-16 (×6): qty 1

## 2017-03-16 MED ORDER — ONDANSETRON HCL 4 MG/2ML IJ SOLN: 4.0000 mg | Freq: Four times a day (QID) | INTRAMUSCULAR | Status: DC | PRN

## 2017-03-16 MED ORDER — DEXTROSE 5 % IV SOLN
500.0000 mg | INTRAVENOUS | Status: DC
  Administered 2017-03-16 – 2017-03-19 (×4): 500 mg via INTRAVENOUS
  Filled 2017-03-16 (×4): qty 500

## 2017-03-16 NOTE — Progress Notes (Signed)
Pharmacy Antibiotic Note  Trevor Griffin is a 61 y.o. male admitted on 03/16/2017 with cellulitis.  Pharmacy has been consulted for Vanc/Zosyn dosing.  Afebrile, LA 1.33, WBC 4.7, Scr 1.09   Plan: Vancomycin 1500mg  IV x1 Then 1000mg  IV every 12 hours.  Goal trough 10-15 mcg/mL. Zosyn 3.375g IV 20min infusion X1 THEN 3.375g IV q8h (4 hour infusion).  F/U renal function, clinical status, C&S, vanc trough at SS    Temp (24hrs), Avg:97.7 F (36.5 C), Min:97.7 F (36.5 C), Max:97.7 F (36.5 C)  Recent Labs  Lab 03/16/17 1042 03/16/17 1052  WBC 4.7  --   CREATININE 1.09  --   LATICACIDVEN  --  1.33    Estimated Creatinine Clearance: 78.7 mL/min (by C-G formula based on SCr of 1.09 mg/dL).    Allergies  Allergen Reactions  . Hydromorphone Nausea Only and Other (See Comments)    Headaches also  . Nubain [Nalbuphine Hcl] Nausea Only and Other (See Comments)    Headaches also    Antimicrobials this admission: Vanc 11/12 >> Zosyn 11/12 >>  Dose adjustments this admission: N/A  Microbiology results: N/A  Thank you for allowing pharmacy to be a part of this patient's care.  Felicity Pellegrini  PharmD Candidate '19 03/09/2017 3:30 PM

## 2017-03-16 NOTE — Telephone Encounter (Signed)
This is not really an issue that I would typically treat.  My plan is to forward my note to Dr. Donzetta Matters from vascular surgery.  Labs looked relatively good --cholesterol is not quite at goal, but pretty good considering.  Can discuss details in follow-up visit.  It looks like he went to the emergency room for his leg.  This may be the best plan for him.  Glenetta Hew, MD

## 2017-03-16 NOTE — Progress Notes (Signed)
Preliminary results by tech - Left Lower Ext. Venous Duplex Completed. Negative for deep vein thrombosis. Oda Cogan, BS, RDMS, RVT

## 2017-03-16 NOTE — H&P (Signed)
[facility] History and Physical   Patient: Trevor Griffin VFI:[id number]   PCP: System, Pcp Not In DOB: 10-Dec-1955   DOA: 03/16/2017   DOS: 03/16/2017   DOS: the patient was seen and examined on 03/16/2017  Patient coming from: The patient is coming from home.  Chief Complaint: Left leg pain and swelling  HPI: Trevor Griffin is a 61 y.o. male with Past medical history of CAD S/P PCI, COPD, lung cancer S/P lobectomy, tongue cancer S/P surgery, hypothyroidism, alcohol abuse, GERD, chronic systolic CHF. Patient presented with complaints of left leg pain and swelling. Patient actually identified an area of redness on his left leg 3 months ago.  He felt he had a bug bite and was given oral Keflex for 7 days. That redness area spread further from his upper thigh involving his lower calf.  Patient was referred to dermatology.  No biopsy was performed but patient was given another prolonged course of antibiotics with Keflex. Patient continues to have this redness involving the left leg and was also referred to vascular surgery and was told that he has SVT. Since last 1 week patient has noted increasing pain involving the left leg both at rest as well as on ambulation. He also noted that the redness is not progressing involving the foot area. He denies any trauma or injury denies any procedures done on the leg.  No nausea no vomiting no fever no chills. He started noticing some cough for last 1 week as well. No chest pain at the time of my evaluation but he does have some tingling-like chest pain on-and-off on the left side resolving on its own. No abdominal pain no diarrhea no constipation. His wife has used Vicks VapoRub x2 in the last 3 months on his leg with improvement in his pain as well as also rubs an ointment on a daily basis.  ED Course: Patient was seen by vascular surgery recommended repeat with IV antibiotics and admit to the hospital.  Ultrasound Doppler was negative for  DVT  At his baseline ambulates walker And is independent for most of his ADL; manages his medication on his own.  Review of Systems: as mentioned in the history of present illness.  All other systems reviewed and are negative.  Past Medical History:  Diagnosis Date  . Acute ST elevation myocardial infarction (STEMI) involving left anterior descending (LAD) coronary artery ([place]) 2008 and 2009   5/'08 - DES x 4 to p-m LAD; 2/'09 - PTCA of  in-stent thrombosis  . Alcohol abuse   . Anxiety   . CAD S/P percutaneous coronary angioplasty 2008   a. Ant STEMI 5/08 w/ 4 Taxus DES p-m LAD (3.0 x 16,16,24,28 mm) ;  b. 2/'09:  Stopped Plavix-> Ant STEMI/stent thrombosis in LAD (PTCA - 3.8 mm), LCX 41md;  c. 10/12: Cath/PCI: p-m LAD stents 10-20% ISR, mLAD 40%, CFX 30-40%, mCFX 90% (Promus Premier DES 3.0 mm x 20 mm - 3.221m), EF 35%; d. cath 2/17/'16 no Sig CAD, patent LAD & Cx stents, EF 40-45%  . Chronic pain   . COPD (chronic obstructive pulmonary disease) ([place]  . Depression   . Fatigue    Sleep study 6/08 was negative. Sleep study 5/13 showed no OSA.  . Marland KitchenERD (gastroesophageal reflux disease)   . Headache(784.0)    migraines  . Hx of radiation therapy 10/25/12-12/10/12    Right Tongue/ Bilateral Neck //Total Dose, 60 Gy in 30 Fractions  . Hyperlipidemia   .  Hypertension   . Ischemic cardiomyopathy    a. Echo 08/20/11 with EF 50-55% (previously 35%);  b. 02/2014 Echo: EF 45-50-%, mid-apicalanteroseptal AK, Gr 1 dd, mild AI, midlly dil LA.  . Lung cancer ([place]) dx'd 2015  . Obesity   . Tobacco abuse   . Tongue cancer ([place]) dx'd 2014   s/p resection - [facility]   Past Surgical History:  Procedure Laterality Date  . CORONARY ANGIOPLASTY WITH STENT PLACEMENT  2008-2012   a. 5/'08: Anterior STEMI: Proximal-mid LAD PCI: 4 total Taxus DES stents 3.0 mm x  16 mm, 16 mm, 24 mm and 28 mm.( post-dil 3.5 mm);; b. 2/'09: PTCA LAD in-stent thrombosis - 3.8 mm;; c. 10/'12: mCx Promus Element DES 3.0 x 20  mm- post-dil - 3.25 mm. -> patent in 2016  . FRACTURE SURGERY Right    as a child,leg  . LEFT HEART CATH AND CORONARY ANGIOGRAPHY  2006-2012   a) 2006: minimal CAD, normal EF;; b) 5/'08 - Ant STEMI - 100% p-mLAD (DES x 4 in LAD);; c)2/'09: Ant STEMI for IST/thrombosis of LAD ->PTCA, 80% mCx;; 10/'12: mCx 80-90% - DES PCI, patent LAD stents, EF ~35% - anterolateral AK, apical severe HK.  Marland Kitchen TRANSTHORACIC ECHOCARDIOGRAM  02/2014   EF 45-50%.  Akinesis of the mid apical anteroseptal wall.  GR 1 DD.  Mild MR.   Social History:  reports that he has been smoking cigars.  He has a 20.00 pack-year smoking history. he has never used smokeless tobacco. He reports that he drinks alcohol. He reports that he does not use drugs.  Allergies  Allergen Reactions  . Hydromorphone Nausea Only and Other (See Comments)    Headaches also  . Nubain [Nalbuphine Hcl] Nausea Only and Other (See Comments)    Headaches also     Family History  Problem Relation Age of Onset  . Kidney disease Mother        CHRONIC  . Cancer Sister   . Liver disease Sister        LIVER FAILURE  . Heart attack Brother      Prior to Admission medications   Medication Sig Start Date End Date Taking? Authorizing Provider  acetaminophen (TYLENOL) 500 MG tablet Take 1,000 mg at bedtime by mouth.    Yes [provider]  aspirin 81 MG tablet Take 1 tablet (81 mg total) by mouth daily. Patient taking differently: Take 162 mg 2 (two) times daily by mouth.  02/25/14  Yes Rogelia Mire, NP  atorvastatin (LIPITOR) 80 MG tablet Take 1 tablet (80 mg total) by mouth daily. 02/25/14  Yes Rogelia Mire, NP  buPROPion (WELLBUTRIN SR) 150 MG 12 hr tablet Take 1 tablet (150 mg total) by mouth 2 (two) times daily. 02/25/14  Yes Rogelia Mire, NP  esomeprazole (NEXIUM) 20 MG capsule Take 20 mg daily at 12 noon by mouth.   Yes [provider]  levothyroxine (SYNTHROID, LEVOTHROID) 100 MCG tablet Take 1 tablet  (100 mcg total) by mouth daily before breakfast. 01/12/17  Yes Gorsuch, Ni, MD  metoprolol tartrate (LOPRESSOR) 25 MG tablet Take 0.5 tablets (12.5 mg total) by mouth 2 (two) times daily. 02/25/14  Yes Rogelia Mire, NP  Multiple Vitamins-Minerals (MULTIVITAMIN WITH MINERALS) tablet Take 1 tablet by mouth daily.     Yes [provider]  nitroGLYCERIN (NITROSTAT) 0.4 MG SL tablet Place 1 tablet (0.4 mg total) under the tongue every 5 (five) minutes as needed (up to 3 doses). For  chest pain Patient taking differently: Place 0.4 mg every 5 (five) minutes as needed under the tongue for chest pain (up to 3 doses).  02/25/14  Yes Rogelia Mire, NP    Physical Exam: Vitals:   03/16/17 1700 03/16/17 1745 03/16/17 1815 03/16/17 1830  BP: 109/85 118/61  107/61  Pulse: 94 93  89  Resp: _0 Temp:      TempSrc:      SpO2: 99% 97%  98%    General: Alert, Awake and Oriented to Time, Place and Person. Appear in mild distress, affect appropriate Eyes: PERRL, Conjunctiva normal ENT: Oral Mucosa clear moist. Neck: no JVD, no Abnormal Mass Or lumps Cardiovascular: S1 and S2 Present, no Murmur, Peripheral Pulses Present Respiratory: normal respiratory effort, Bilateral Air entry equal and Decreased, no use of accessory muscle, bilateral Crackles, no wheezes Abdomen: Bowel Sound present, Soft and no tenderness, no hernia Skin: Left leg redness from mid thigh all the way to the foot, warm and tender to touch. Diffuse macular rash involving neck area from radiation. Another diffuse macular rash involving bilateral upper extremities dorsal aspect. Extremities: left Pedal edema left calf tenderness Neurologic: Grossly no focal neuro deficit. Bilaterally Equal motor strength  Labs on Admission:  CBC: Recent Labs  Lab 03/16/17 1042 03/16/17 1739  WBC 4.7 3.6*  NEUTROABS 3.6  --   HGB 9.2* 8.6*  HCT 30.1* 28.2*  MCV 83.6 83.2  PLT 228 814   Basic Metabolic Panel: Recent  Labs  Lab 03/16/17 1042  NA 128*  K 4.3  CL 97*  CO2 24  GLUCOSE 111*  BUN 9  CREATININE 1.09  CALCIUM 8.8*   GFR: Estimated Creatinine Clearance: 78.7 mL/min (by C-G formula based on SCr of 1.09 mg/dL). Liver Function Tests: Recent Labs  Lab 03/16/17 1042  AST 19  ALT 12*  ALKPHOS 73  BILITOT 1.2  PROT 7.1  ALBUMIN 3.3*   No results for input(s): LIPASE, AMYLASE in the last 168 hours. No results for input(s): AMMONIA in the last 168 hours. Coagulation Profile: No results for input(s): INR, PROTIME in the last 168 hours. Cardiac Enzymes: No results for input(s): CKTOTAL, CKMB, CKMBINDEX, TROPONINI in the last 168 hours. BNP (last 3 results) No results for input(s): PROBNP in the last 8760 hours. HbA1C: No results for input(s): HGBA1C in the last 72 hours. CBG: No results for input(s): GLUCAP in the last 168 hours. Lipid Profile: No results for input(s): CHOL, HDL, LDLCALC, TRIG, CHOLHDL, LDLDIRECT in the last 72 hours. Thyroid Function Tests: No results for input(s): TSH, T4TOTAL, FREET4, T3FREE, THYROIDAB in the last 72 hours. Anemia Panel: Recent Labs    03/16/17 1740  RETICCTPCT 3.3*   Urine analysis:    Component Value Date/Time   COLORURINE YELLOW 02/23/2014 1707   APPEARANCEUR CLEAR 02/23/2014 1707   LABSPEC 1.005 02/23/2014 1707   PHURINE 7.0 02/23/2014 1707   GLUCOSEU NEGATIVE 02/23/2014 1707   HGBUR NEGATIVE 02/23/2014 1707   BILIRUBINUR NEGATIVE 02/23/2014 1707   KETONESUR NEGATIVE 02/23/2014 1707   PROTEINUR NEGATIVE 02/23/2014 1707   UROBILINOGEN 0.2 02/23/2014 1707   NITRITE NEGATIVE 02/23/2014 1707   LEUKOCYTESUR NEGATIVE 02/23/2014 1707    Radiological Exams on Admission: Ct Angio Chest Pe W And/or Wo Contrast  Result Date: 03/16/2017 CLINICAL DATA:  Shortness of breath. EXAM: CT ANGIOGRAPHY CHEST WITH CONTRAST TECHNIQUE: Multidetector CT imaging of the chest was performed using the standard protocol during bolus administration of  intravenous contrast. Multiplanar CT  image reconstructions and MIPs were obtained to evaluate the vascular anatomy. CONTRAST:  68 mL of Isovue 370 intravenously. COMPARISON:  CT scan of October 20, 2016. FINDINGS: Cardiovascular: Satisfactory opacification of the pulmonary arteries to the segmental level. No evidence of pulmonary embolism. Coronary artery calcifications are noted. Atherosclerosis of thoracic aorta is noted without aneurysm formation. No pericardial effusion. Mediastinum/Nodes: No enlarged mediastinal, hilar, or axillary lymph nodes. Thyroid gland, trachea, and esophagus demonstrate no significant findings. Lungs/Pleura: No pneumothorax or pleural effusion is noted. Status post right lobectomy. Multiple rounded densities are noted in the left upper lobe laterally which may represent pneumonia, but neoplasm cannot be excluded. The largest measures 11 mm. Similar abnormalities are noted in the right lower lobe. Upper Abdomen: No acute abnormality. Musculoskeletal: No chest wall abnormality. No acute or significant osseous findings. Review of the MIP images confirms the above findings. IMPRESSION: No definite evidence of pulmonary embolus. Coronary artery calcifications are noted suggesting coronary artery disease. Status post right lobectomy. Multiple nodular densities are noted in the left upper lobe and right lower lobe with the largest measuring 11 mm. This may simply represent multifocal pneumonia, but metastatic disease cannot be excluded. Follow-up CT scan in 2-3 weeks is recommended to ensure resolution or stability. Aortic Atherosclerosis (ICD10-I70.0). Electronically Signed   By: Marijo Conception, M.D.   On: 03/16/2017 16:15   EKG: Independently reviewed. there are no previous tracings available for comparison, normal sinus rhythm.  Assessment/Plan 1. Cellulitis Patient presenting with progressively worsening redness as well as pain involving his left leg. Vascular surgery has been  consulted and feels that this appears to be superficial femoral phlebitis and considering possible use of anticoagulation. But he also recommends that the patient should be admitted to the hospital for IV antibiotics. I will check ESR CRP CK, blood culture start the patient on IV vancomycin. Lower extremity Doppler already performed, this could also possibly be Baker's cyst rupture therefore will get ultrasound soft tissue.  2.  Community-acquired pneumonia. Patient complained about having some chest pain in the ER and underwent CT chest PE protocol. Pulmonary embolism has been ruled out although it makes a comment about having multifocal pneumonia. Patient has 1 week of cough with shortness of breath. We will treat with ceftriaxone and azithromycin and follow-up on sputum culture and urine antigens.  3.  Chronic iron deficiency anemia. Checking radicular site count to rule out any acute bleed in the left leg area. If no improvement with IV antibiotics may require further imaging of his left leg  4.  Coronary artery disease. Chronic systolic CHF. Appears euvolemic. Blood pressure is relatively soft. Continue aspirin starting tomorrow, continue Lipitor. Holding Lopressor due to soft blood pressure.  5.  History of GERD. Continue PPI.  6.  Mood disorder. Continue Wellbutrin.  7.  History of tongue cancer. History of lung cancer. Patient generally eats a soft diet at home we will continue the same.  The hospital. Outpatient follow-up with oncology.  Nutrition: cardiac diet DVT Prophylaxis: subcutaneous Heparin  Advance goals of care discussion: full code   Consults: vascular surgery  Family Communication: no family was present at bedside, at the time of interview.  Disposition: Admitted as inpatient, med-surge unit. Likely to be discharged home, in 3-4 days.  Author: Berle Mull, MD [facility] Pager: [phone] 03/16/2017  If 7PM-7AM, please contact  night-coverage [email or web address] Password [email or web address]

## 2017-03-16 NOTE — ED Provider Notes (Signed)
[facility] EMERGENCY DEPARTMENT Provider Note   CSN: [id number] Arrival date & time: 03/16/17  1032     History   Chief Complaint Chief Complaint  Patient presents with  . Leg Swelling    HPI Trevor Griffin is a 61 y.o. male.  HPI  61 y.o. male with a hx of CAD, COPD, HTN, HLD, presents to the Emergency Department today via EMS due to left leg swelling today. Pt was supposed to see vascular surgery in office today, but missed appointment. Notes increase pain in LLE with spreading redness up medial thigh. Rates pain 8/10. Worse at all times of the day. No fevers. Notes pain shooting from thigh to feet. Notes numbness to toes. No CP/SOB/ABD pain. No N/V. Pt has been seen by Oncology, Dermatology, Cardiology, and Vascular Surgery for same. Treated initially with ABX on two different occasions. Then LLE [place] was done in September (See below) and showed thrombophlebitis. No other symptoms noted   [place] LLE on 01-21-17 IMPRESSION: 1. No evidence of DVT within the left lower extremity. 2. Examination is positive for occlusive superficial thrombophlebitis involving the greater saphenous vein extending from the proximal thigh to the ankle. Again, there is no extension of this superficial thrombophlebitis to the saphenofemoral junction or deep venous system of the left lower extremity  Past Medical History:  Diagnosis Date  . Acute ST elevation myocardial infarction (STEMI) involving left anterior descending (LAD) coronary artery ([place]) 2008 and 2009   5/'08 - DES x 4 to p-m LAD; 2/'09 - PTCA of  in-stent thrombosis  . Alcohol abuse   . Anxiety   . CAD S/P percutaneous coronary angioplasty 2008   a. Ant STEMI 5/08 w/ 4 Taxus DES p-m LAD (3.0 x 16,16,24,28 mm) ;  b. 2/'09:  Stopped Plavix-> Ant STEMI/stent thrombosis in LAD (PTCA - 3.8 mm), LCX 7m/d;  c. 10/12: Cath/PCI: p-m LAD stents 10-20% ISR, mLAD 40%, CFX 30-40%, mCFX 90% (Promus Premier DES 3.0 mm x 20 mm - 3.68mm ),  EF 35%; d. cath 2/17/'16 no Sig CAD, patent LAD & Cx stents, EF 40-45%  . Chronic pain   . COPD (chronic obstructive pulmonary disease) ([place])   . Depression   . Fatigue    Sleep study 6/08 was negative. Sleep study 5/13 showed no OSA.  Marland Kitchen GERD (gastroesophageal reflux disease)   . Headache(784.0)    migraines  . Hx of radiation therapy 10/25/12-12/10/12    Right Tongue/ Bilateral Neck //Total Dose, 60 Gy in 30 Fractions  . Hyperlipidemia   . Hypertension   . Ischemic cardiomyopathy    a. Echo 08/20/11 with EF 50-55% (previously 35%);  b. 02/2014 Echo: EF 45-50-%, mid-apicalanteroseptal AK, Gr 1 dd, mild AI, midlly dil LA.  . Lung cancer ([place]) dx'd 2015  . Obesity   . Tobacco abuse   . 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 Pain and swelling of lower leg, left 03/04/2017  . Hx of cancer of lung 01/12/2017  . History of lung cancer 01/12/2017  . Bacterial skin infection 01/12/2017  . Iron deficiency anemia 10/13/2016  . Microcytic anemia 03/20/2016  . 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 Acquired hypothyroidism 03/20/2016  . Angina pectoris ([place]) 06/21/2014  . Nodule of right lung 04/04/2013  . 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[place]) 10/13/2012  . Tracheostomy stoma dilation ([place])  09/27/2012  . Hemoptysis 09/27/2012  . Hemorrhage from tracheostomy stoma ([place]) 09/27/2012  . Lesion of tongue 07/07/2012  . Essential hypertension 08/25/2011  . Hip pain, bilateral 03/26/2011  . Low back pain 03/26/2011  . Anxiety 03/26/2011  . Alcohol abuse 02/26/2011  . Cardiomyopathy, ischemic-EF 45-50% by 2D Oct 2015 11/06/2009  . Dyslipidemia 09/26/2009  . SMOKER 09/26/2009  . CAD S/P multiple PCIs 09/26/2009    Past Surgical History:  Procedure Laterality Date  . CORONARY ANGIOPLASTY WITH STENT PLACEMENT  2008-2012   a. 5/'08: Anterior STEMI: Proximal-mid LAD PCI: 4  total Taxus DES stents 3.0 mm x  16 mm, 16 mm, 24 mm and 28 mm.( post-dil 3.5 mm);; b. 2/'09: PTCA LAD in-stent thrombosis - 3.8 mm;; c. 10/'12: mCx Promus Element DES 3.0 x 20 mm- post-dil - 3.25 mm. -> patent in 2016  . FRACTURE SURGERY Right    as a child,leg  . LEFT HEART CATH AND CORONARY ANGIOGRAPHY  2006-2012   a) 2006: minimal CAD, normal EF;; b) 5/'08 - Ant STEMI - 100% p-mLAD (DES x 4 in LAD);; c)2/'09: Ant STEMI for IST/thrombosis of LAD ->PTCA, 80% mCx;; 10/'12: mCx 80-90% - DES PCI, patent LAD stents, EF ~35% - anterolateral AK, apical severe HK.  Marland Kitchen TRANSTHORACIC ECHOCARDIOGRAM  02/2014   EF 45-50%.  Akinesis of the mid apical anteroseptal wall.  GR 1 DD.  Mild MR.       Home Medications    Prior to Admission medications   Medication Sig Start Date End Date Taking? Authorizing Provider  acetaminophen (TYLENOL) 500 MG tablet Take 1,000 mg by mouth every 8 (eight) hours as needed (pain).    [provider]  aspirin 81 MG tablet Take 1 tablet (81 mg total) by mouth daily. 02/25/14   Rogelia Mire, NP  atorvastatin (LIPITOR) 80 MG tablet Take 1 tablet (80 mg total) by mouth daily. 02/25/14   Rogelia Mire, NP  buPROPion (WELLBUTRIN SR) 150 MG 12 hr tablet Take 1 tablet (150 mg total) by mouth 2 (two) times daily. 02/25/14   Rogelia Mire, NP  cephALEXin (KEFLEX) 500 MG capsule Take 1 capsule (500 mg total) by mouth 3 (three) times daily. 01/12/17   Heath Lark, MD  fluconazole (DIFLUCAN) 100 MG tablet Take 1 tablet (100 mg total) by mouth daily. 10/13/16   Heath Lark, MD  levothyroxine (SYNTHROID, LEVOTHROID) 100 MCG tablet Take 1 tablet (100 mcg total) by mouth daily before breakfast. 01/12/17   Heath Lark, MD  metoprolol tartrate (LOPRESSOR) 25 MG tablet Take 0.5 tablets (12.5 mg total) by mouth 2 (two) times daily. 02/25/14   Rogelia Mire, NP  Multiple Vitamins-Minerals (MULTIVITAMIN WITH MINERALS) tablet Take 1 tablet by mouth daily.       [provider]  nitroGLYCERIN (NITROSTAT) 0.4 MG SL tablet Place 1 tablet (0.4 mg total) under the tongue every 5 (five) minutes as needed (up to 3 doses). For chest pain 02/25/14   Rogelia Mire, NP  omeprazole (PRILOSEC) 20 MG capsule Take 1 capsule (20 mg total) by mouth daily. 11/28/15   Charlesetta Shanks, MD    Family History Family History  Problem Relation Age of Onset  . Kidney disease Mother        CHRONIC  . Cancer Sister   . Liver disease Sister        LIVER FAILURE  . Heart attack Brother     Social History Social History   Tobacco Use  .  Smoking status: Current Every Day Smoker    Packs/day: 0.50    Years: 40.00    Pack years: 20.00    Types: Cigars  . Smokeless tobacco: Never Used  . Tobacco comment: Stopped smoking cigars day before surgery, 06/09/14 smoking 1/2 pack cigars daily, 06/29/15; currently smokes 3/4 ppd of cigars (as of 02/14/16)-gwd  Substance Use Topics  . Alcohol use: Yes    Comment: occ  . Drug use: No     Allergies   Hydromorphone and Nubain [nalbuphine hcl]   Review of Systems Review of Systems ROS reviewed and all are negative for acute change except as noted in the HPI.  Physical Exam Updated Vital Signs BP 123/86 (BP Location: Right Arm)   Pulse 94   Temp 97.7 F (36.5 C) (Axillary) Comment (Src): due to oral reconstruction  Resp 20   SpO2 100%   Physical Exam  Constitutional: He is oriented to person, place, and time. Vital signs are normal. He appears well-developed and well-nourished.  HENT:  Head: Normocephalic and atraumatic.  Right Ear: Hearing normal.  Left Ear: Hearing normal.  Eyes: Conjunctivae and EOM are normal. Pupils are equal, round, and reactive to light.  Neck: Normal range of motion. Neck supple.  Cardiovascular: Normal rate, regular rhythm, normal heart sounds and intact distal pulses.  Pulmonary/Chest: Effort normal and breath sounds normal.  Musculoskeletal: Normal range of motion.  LLE  (see image below) with non blanchable redness circumferentially to calf and spreading medially to thigh. NVI. Distal pulses appreciated.   Neurological: He is alert and oriented to person, place, and time.  Skin: Skin is warm and dry.  Psychiatric: He has a normal mood and affect. His speech is normal and behavior is normal. Thought content normal.  Nursing note and vitals reviewed.      ED Treatments / Results  Labs (all labs ordered are listed, but only abnormal results are displayed) Labs Reviewed  COMPREHENSIVE METABOLIC PANEL - Abnormal; Notable for the following components:      Result Value   Sodium 128 (*)    Chloride 97 (*)    Glucose, Bld 111 (*)    Calcium 8.8 (*)    Albumin 3.3 (*)    ALT 12 (*)    All other components within normal limits  CBC WITH DIFFERENTIAL/PLATELET - Abnormal; Notable for the following components:   RBC 3.60 (*)    Hemoglobin 9.2 (*)    HCT 30.1 (*)    MCH 25.6 (*)    RDW 18.4 (*)    Lymphs Abs 0.5 (*)    All other components within normal limits  I-STAT CG4 LACTIC ACID, ED  I-STAT TROPONIN, ED    EKG  EKG Interpretation None       Radiology Ct Angio Chest Pe W And/or Wo Contrast  Result Date: 03/16/2017 CLINICAL DATA:  Shortness of breath. EXAM: CT ANGIOGRAPHY CHEST WITH CONTRAST TECHNIQUE: Multidetector CT imaging of the chest was performed using the standard protocol during bolus administration of intravenous contrast. Multiplanar CT image reconstructions and MIPs were obtained to evaluate the vascular anatomy. CONTRAST:  68 mL of Isovue 370 intravenously. COMPARISON:  CT scan of October 20, 2016. FINDINGS: Cardiovascular: Satisfactory opacification of the pulmonary arteries to the segmental level. No evidence of pulmonary embolism. Coronary artery calcifications are noted. Atherosclerosis of thoracic aorta is noted without aneurysm formation. No pericardial effusion. Mediastinum/Nodes: No enlarged mediastinal, hilar, or axillary lymph  nodes. Thyroid gland, trachea, and esophagus demonstrate no significant  findings. Lungs/Pleura: No pneumothorax or pleural effusion is noted. Status post right lobectomy. Multiple rounded densities are noted in the left upper lobe laterally which may represent pneumonia, but neoplasm cannot be excluded. The largest measures 11 mm. Similar abnormalities are noted in the right lower lobe. Upper Abdomen: No acute abnormality. Musculoskeletal: No chest wall abnormality. No acute or significant osseous findings. Review of the MIP images confirms the above findings. IMPRESSION: No definite evidence of pulmonary embolus. Coronary artery calcifications are noted suggesting coronary artery disease. Status post right lobectomy. Multiple nodular densities are noted in the left upper lobe and right lower lobe with the largest measuring 11 mm. This may simply represent multifocal pneumonia, but metastatic disease cannot be excluded. Follow-up CT scan in 2-3 weeks is recommended to ensure resolution or stability. Aortic Atherosclerosis (ICD10-I70.0). Electronically Signed   By: Marijo Conception, M.D.   On: 03/16/2017 16:15    Procedures Procedures (including critical care time)  Medications Ordered in ED Medications  iopamidol (ISOVUE-370) 76 % injection (not administered)  vancomycin (VANCOCIN) IVPB 1000 mg/200 mL premix (not administered)  morphine 4 MG/ML injection 4 mg (4 mg Intravenous Given 03/16/17 1205)  sodium chloride 0.9 % bolus 1,000 mL (0 mLs Intravenous Stopped 03/16/17 1321)  morphine 4 MG/ML injection 4 mg (4 mg Intravenous Given 03/16/17 1350)     Initial Impression / Assessment and Plan / ED Course  I have reviewed the triage vital signs and the nursing notes.  Pertinent labs & imaging results that were available during my care of the patient were reviewed by me and considered in my medical decision making (see chart for details).  Final Clinical Impressions(s) / ED Diagnoses  {I have  reviewed and evaluated the relevant laboratory values. {I have reviewed and evaluated the relevant imaging studies.  {I have reviewed the relevant previous healthcare records.  {I obtained HPI from historian. {Patient discussed with supervising physician.  ED Course:  Assessment: Pt is a 61 y.o. male with a hx of CAD, COPD, HTN, HLD, presents to the Emergency Department today via EMS due to left leg swelling today. Pt was supposed to see vascular surgery in office today, but missed appointment. Notes increase pain in LLE with spreading redness up medial thigh. Rates pain 8/10. Worse at all times of the day. No fevers. Notes pain shooting from thigh to feet. Notes numbness to toes. No CP/SOB/ABD pain. No N/V. Pt has been seen by Oncology, Dermatology, Cardiology, and Vascular Surgery for same. Treated initially with ABX on two different occasions. Then LLE [place] was done in September (See below) and showed thrombophlebitis. On exam, pt in NAD. Nontoxic/nonseptic appearing. VSS. Afebrile. Lungs CTA. Heart RRR. Abdomen nontender soft. LLE with non blanchable redness circumferentially to calf and spreading medially to thigh. NVI. Distal pulses appreciated. Labs reassuring. Hyponatremia noted. Given NS bolus. No WBC. Concern for possible developing DVT vs worsening thrombophlebitis. LLE [place] was negative for DVT. Consult to vascular Surgery Donzetta Matters) due to concern for possible developing Phlegmasia. Appreciate consult. Given analgesia in ED. During ED stay pt started developing mild chest pain that he states he has been experiencing on and off the for past week. EKG unremarkable. Trop negative. CT Angio unremarkable. Plan is to Admit and treat with IV Vanc/Zosyn for suspect cellulitis.    Disposition/Plan:  Admit Pt acknowledges and agrees with plan  Supervising Physician Deno Etienne, DO  Final diagnoses:  Cellulitis of left lower extremity    ED Discharge Orders  None       Shary Decamp, PA-C 03/16/17  [place], [place], DO 03/17/17 [id number]

## 2017-03-16 NOTE — Consult Note (Signed)
[facility] NOTE   MRN : [id number]  Reason for Consult: Swollen red left leg Referring Physician: ED  History of Present Illness: 68 male with 3 month history of left swollen leg, pain and erythema.  He thought he had a spider bite.  He states it started out as a red streak down the inside of his thigh to his foot.  He has taken 3 weeks of keflex with no improvement.  The edema and erythema has spread medial, anteriorly and posteriorly.  He has now developed numbness in his left toes.     He has consulted his oncologist who gave him keflex.  Then he saw a dermatologist who refereed him to [place] vein.  They diagnosed him with thrombophlebitis and gave him another dose of Keflex.    Past medical history includes: Tongue cancer, cardiomyopathy, HTN, hyperlipidemia, COPD, CAD with history of STEMI.       Current Facility-Administered Medications  Medication Dose Route Frequency Provider Last Rate Last Dose  . iopamidol (ISOVUE-370) 76 % injection           . vancomycin (VANCOCIN) IVPB 1000 mg/200 mL premix  1,000 mg Intravenous Once Shary Decamp, PA-C       Current Outpatient Medications  Medication Sig Dispense Refill  . acetaminophen (TYLENOL) 500 MG tablet Take 1,000 mg at bedtime by mouth.     Marland Kitchen aspirin 81 MG tablet Take 1 tablet (81 mg total) by mouth daily. (Patient taking differently: Take 162 mg 2 (two) times daily by mouth. ) 30 tablet 1  . atorvastatin (LIPITOR) 80 MG tablet Take 1 tablet (80 mg total) by mouth daily. 30 tablet 6  . buPROPion (WELLBUTRIN SR) 150 MG 12 hr tablet Take 1 tablet (150 mg total) by mouth 2 (two) times daily. 60 tablet 1  . esomeprazole (NEXIUM) 20 MG capsule Take 20 mg daily at 12 noon by mouth.    . levothyroxine (SYNTHROID, LEVOTHROID) 100 MCG tablet Take 1 tablet (100 mcg total) by mouth daily before breakfast. 30 tablet 3  . metoprolol tartrate (LOPRESSOR) 25 MG tablet Take 0.5 tablets (12.5 mg total) by mouth 2  (two) times daily. 30 tablet 6  . Multiple Vitamins-Minerals (MULTIVITAMIN WITH MINERALS) tablet Take 1 tablet by mouth daily.      . nitroGLYCERIN (NITROSTAT) 0.4 MG SL tablet Place 1 tablet (0.4 mg total) under the tongue every 5 (five) minutes as needed (up to 3 doses). For chest pain (Patient taking differently: Place 0.4 mg every 5 (five) minutes as needed under the tongue for chest pain (up to 3 doses). ) 25 tablet 3  . omeprazole (PRILOSEC) 20 MG capsule Take 1 capsule (20 mg total) by mouth daily. (Patient not taking: Reported on 03/16/2017) 30 capsule 0    Pt meds include: Statin :Yes Betablocker: Yes ASA: Yes Other anticoagulants/antiplatelets: none  Past Medical History:  Diagnosis Date  . Acute ST elevation myocardial infarction (STEMI) involving left anterior descending (LAD) coronary artery ([place]) 2008 and 2009   5/'08 - DES x 4 to p-m LAD; 2/'09 - PTCA of  in-stent thrombosis  . Alcohol abuse   . Anxiety   . CAD S/P percutaneous coronary angioplasty 2008   a. Ant STEMI 5/08 w/ 4 Taxus DES p-m LAD (3.0 x 16,16,24,28 mm) ;  b. 2/'09:  Stopped Plavix-> Ant STEMI/stent thrombosis in LAD (PTCA - 3.8 mm), LCX 55m/d;  c. 10/12: Cath/PCI: p-m LAD stents 10-20% ISR, mLAD 40%, CFX 30-40%,  mCFX 90% (Promus Premier DES 3.0 mm x 20 mm - 3.80mm ), EF 35%; d. cath 2/17/'16 no Sig CAD, patent LAD & Cx stents, EF 40-45%  . Chronic pain   . COPD (chronic obstructive pulmonary disease) ([place])   . Depression   . Fatigue    Sleep study 6/08 was negative. Sleep study 5/13 showed no OSA.  Marland Kitchen GERD (gastroesophageal reflux disease)   . Headache(784.0)    migraines  . Hx of radiation therapy 10/25/12-12/10/12    Right Tongue/ Bilateral Neck //Total Dose, 60 Gy in 30 Fractions  . Hyperlipidemia   . Hypertension   . Ischemic cardiomyopathy    a. Echo 08/20/11 with EF 50-55% (previously 35%);  b. 02/2014 Echo: EF 45-50-%, mid-apicalanteroseptal AK, Gr 1 dd, mild AI, midlly dil LA.  . Lung cancer ([place])  dx'd 2015  . Obesity   . Tobacco abuse   . 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 CORONARY ANGIOPLASTY WITH STENT PLACEMENT  2008-2012   a. 5/'08: Anterior STEMI: Proximal-mid LAD PCI: 4 total Taxus DES stents 3.0 mm x  16 mm, 16 mm, 24 mm and 28 mm.( post-dil 3.5 mm);; b. 2/'09: PTCA LAD in-stent thrombosis - 3.8 mm;; c. 10/'12: mCx Promus Element DES 3.0 x 20 mm- post-dil - 3.25 mm. -> patent in 2016  . FRACTURE SURGERY Right    as a child,leg  . LEFT HEART CATH AND CORONARY ANGIOGRAPHY  2006-2012   a) 2006: minimal CAD, normal EF;; b) 5/'08 - Ant STEMI - 100% p-mLAD (DES x 4 in LAD);; c)2/'09: Ant STEMI for IST/thrombosis of LAD ->PTCA, 80% mCx;; 10/'12: mCx 80-90% - DES PCI, patent LAD stents, EF ~35% - anterolateral AK, apical severe HK.  Marland Kitchen TRANSTHORACIC ECHOCARDIOGRAM  02/2014   EF 45-50%.  Akinesis of the mid apical anteroseptal wall.  GR 1 DD.  Mild MR.    Social History Social History   Tobacco Use  . Smoking status: Current Every Day Smoker    Packs/day: 0.50    Years: 40.00    Pack years: 20.00    Types: Cigars  . Smokeless tobacco: Never Used  . Tobacco comment: Stopped smoking cigars day before surgery, 06/09/14 smoking 1/2 pack cigars daily, 06/29/15; currently smokes 3/4 ppd of cigars (as of 02/14/16)-gwd  Substance Use Topics  . Alcohol use: Yes    Comment: occ  . Drug use: No    Family History Family History  Problem Relation Age of Onset  . Kidney disease Mother        CHRONIC  . Cancer Sister   . Liver disease Sister        LIVER FAILURE  . Heart attack Brother     Allergies  Allergen Reactions  . Hydromorphone Nausea Only and Other (See Comments)    Headaches also  . Nubain [Nalbuphine Hcl] Nausea Only and Other (See Comments)    Headaches also     REVIEW OF SYSTEMS  General: [ ]  Weight loss, [ ]  Fever, [ ]  chills Neurologic: [ ]  Dizziness, [ ]  Blackouts, [ ]  Seizure [  ] Stroke, [ ]  "Mini stroke", [ ]  Slurred speech, [ ]  Temporary blindness; [ ]  weakness in arms or legs, [ ]  Hoarseness [ ]  Dysphagia Cardiac: [ ]  Chest pain/pressure, [ ]  Shortness of breath at rest [x ] Shortness of breath with exertion, [ ]  Atrial fibrillation or irregular heartbeat  Vascular: [ ]  Pain in legs with walking, [ ]  Pain in legs at rest, [ ]  Pain in legs at night,  [ ]  Non-healing ulcer, [ ]  Blood clot in vein/DVT,   Pulmonary: [ ]  Home oxygen, [ ]  Productive cough, [ ]  Coughing up blood, [ ]  Asthma,  [ ]  Wheezing [x ] COPD Musculoskeletal:  [ ]  Arthritis, [ ]  Low back pain, [ ]  Joint pain Hematologic: [ ]  Easy Bruising, [ ]  Anemia; [ ]  Hepatitis Gastrointestinal: [ ]  Blood in stool, [ ]  Gastroesophageal Reflux/heartburn, Urinary: [ ]  chronic Kidney disease, [ ]  on HD - [ ]  MWF or [ ]  TTHS, [ ]  Burning with urination, [ ]  Difficulty urinating Skin: [x ] Rashes, [ ]  Wounds Psychological: [ ]  Anxiety, [ ]  Depression  Physical Examination Vitals:   03/16/17 1445 03/16/17 1500 03/16/17 1515 03/16/17 1545  BP: 138/87 134/81 135/77 139/77  Pulse: 87 86 89 90  Resp: 20 (!) 23 (!) 21 16  Temp:      TempSrc:      SpO2: 100% 100% 98% 100%   There is no height or weight on file to calculate BMI.  General:  WDWN in NAD Gait: Normal HENT: WNL Eyes: Pupils equal Pulmonary: normal non-labored breathing , without Rales, rhonchi,  wheezing Cardiac: RRR, without  Murmurs, rubs or gallops; No carotid bruits Abdomen: soft, NT, no masses Skin: no rashes, ulcers noted;  no Gangrene , positive cellulitis, the lower left leg is tight and painful to touch; the lateral leg is not erythematous and the toes, as well as the sole of his foot is not affected no open wounds;   Vascular Exam/Pulses:palpable Radial, femoral,DP 2+   Musculoskeletal: no muscle wasting or atrophy;  Neurologic: A&O X 3; Appropriate Affect ;  SENSATION: normal; except left toes are numb with gross sensation intact   MOTOR FUNCTION: 5/5 Symmetric, except for left LE Speech is fluent/normal   Significant Diagnostic Studies: CBC Lab Results  Component Value Date   WBC 4.7 03/16/2017   HGB 9.2 (L) 03/16/2017   HCT 30.1 (L) 03/16/2017   MCV 83.6 03/16/2017   PLT 228 03/16/2017    BMET    Component Value Date/Time   NA 128 (L) 03/16/2017 1042   NA 137 03/04/2017 1509   NA 135 (L) 01/12/2017 0845   K 4.3 03/16/2017 1042   K 4.7 01/12/2017 0845   CL 97 (L) 03/16/2017 1042   CO2 24 03/16/2017 1042   CO2 24 01/12/2017 0845   GLUCOSE 111 (H) 03/16/2017 1042   GLUCOSE 91 01/12/2017 0845   BUN 9 03/16/2017 1042   BUN 10 03/04/2017 1509   BUN 8.0 01/12/2017 0845   CREATININE 1.09 03/16/2017 1042   CREATININE 0.9 01/12/2017 0845   CALCIUM 8.8 (L) 03/16/2017 1042   CALCIUM 9.7 01/12/2017 0845   GFRNONAA >60 03/16/2017 1042   GFRAA >60 03/16/2017 1042   Estimated Creatinine Clearance: 78.7 mL/min (by C-G formula based on SCr of 1.09 mg/dL).  COAG Lab Results  Component Value Date   INR 1.10 06/21/2014   INR 0.93 06/02/2013   INR 1.04 09/28/2012     Non-Invasive Vascular Imaging:  Venous duplex negative for DVT 03/16/2017  01/21/2017 venous duplex IMPRESSION: 1. No evidence of DVT within the left lower extremity. 2. Examination is positive for occlusive superficial thrombophlebitis involving the greater saphenous vein extending from the proximal thigh to the ankle. Again, there is no extension of this superficial thrombophlebitis to the saphenofemoral  junction or deep venous system of the left lower extremity.   ASSESSMENT/PLAN:  Left LE cellulitis with pain.  He has palpable pulse B LE.  The venous duplex is negative for DVT.  No vascular intervention is advised.     Laurence Slate [facility] 03/16/2017 4:21 PM   I have independently interviewed and examined patient and agree with PA assessment and plan above. Unsure of inciting event but certainly had long segment superficial  thrombophlebitis 2 months ago. The most cephalad aspect of distribution of his erythema/ecchymosis at this time is in the saphenous vein distribution. From that standpoint could consider treatment with fondaparinux 2.5mg  x 45 days if anticoagulation not contraindicated but more important is treating source.   Shaqueena Mauceri C. Donzetta Matters, MD [facility] [place] Office: [phone] Pager: [phone]

## 2017-03-16 NOTE — ED Notes (Signed)
Patient transported to vascular. 

## 2017-03-16 NOTE — ED Triage Notes (Addendum)
Per EMS- pt is from home and was scheduled to see vascular for left leg swelling today. Pt missed the appointment. Pt noted to have color change, heat and swelling in leg. Pt reports pain and sensation change in his leg. Pulses present with doppler in triage. Pt states that this has been ongoing for 3 months but has worsened recently and was told to come to the hospital.

## 2017-03-17 DIAGNOSIS — E039 Hypothyroidism, unspecified: Secondary | ICD-10-CM

## 2017-03-17 DIAGNOSIS — C023 Malignant neoplasm of anterior two-thirds of tongue, part unspecified: Secondary | ICD-10-CM

## 2017-03-17 DIAGNOSIS — I2510 Atherosclerotic heart disease of native coronary artery without angina pectoris: Secondary | ICD-10-CM

## 2017-03-17 DIAGNOSIS — I10 Essential (primary) hypertension: Secondary | ICD-10-CM

## 2017-03-17 DIAGNOSIS — Z9861 Coronary angioplasty status: Secondary | ICD-10-CM

## 2017-03-17 DIAGNOSIS — E785 Hyperlipidemia, unspecified: Secondary | ICD-10-CM

## 2017-03-17 DIAGNOSIS — Z85118 Personal history of other malignant neoplasm of bronchus and lung: Secondary | ICD-10-CM

## 2017-03-17 DIAGNOSIS — D509 Iron deficiency anemia, unspecified: Secondary | ICD-10-CM

## 2017-03-17 LAB — COMPREHENSIVE METABOLIC PANEL
ALBUMIN: 2.8 g/dL — AB (ref 3.5–5.0)
ALT: 11 U/L — ABNORMAL LOW (ref 17–63)
ANION GAP: 7 (ref 5–15)
AST: 21 U/L (ref 15–41)
Alkaline Phosphatase: 66 U/L (ref 38–126)
BILIRUBIN TOTAL: 1.2 mg/dL (ref 0.3–1.2)
BUN: 9 mg/dL (ref 6–20)
CHLORIDE: 100 mmol/L — AB (ref 101–111)
CO2: 23 mmol/L (ref 22–32)
Calcium: 8.4 mg/dL — ABNORMAL LOW (ref 8.9–10.3)
Creatinine, Ser: 1.13 mg/dL (ref 0.61–1.24)
GFR calc Af Amer: >60 mL/min (ref >60)
Glucose, Bld: 97 mg/dL (ref 65–99)
POTASSIUM: 4.5 mmol/L (ref 3.5–5.1)
Sodium: 130 mmol/L — ABNORMAL LOW (ref 135–145)
TOTAL PROTEIN: 6 g/dL — AB (ref 6.5–8.1)

## 2017-03-17 LAB — CBC
HEMATOCRIT: 26.7 % — AB (ref 39.0–52.0)
HEMOGLOBIN: 8.2 g/dL — AB (ref 13.0–17.0)
MCH: 25.9 pg — ABNORMAL LOW (ref 26.0–34.0)
MCHC: 30.7 g/dL (ref 30.0–36.0)
MCV: 84.5 fL (ref 78.0–100.0)
Platelets: 198 10*3/uL (ref 150–400)
RBC: 3.16 MIL/uL — ABNORMAL LOW (ref 4.22–5.81)
RDW: 18.7 % — ABNORMAL HIGH (ref 11.5–15.5)
WBC: 4.9 10*3/uL (ref 4.0–10.5)

## 2017-03-17 LAB — HIV ANTIBODY (ROUTINE TESTING W REFLEX): HIV SCREEN 4TH GENERATION: NONREACTIVE

## 2017-03-17 LAB — TROPONIN I

## 2017-03-17 LAB — GLUCOSE, CAPILLARY: GLUCOSE-CAPILLARY: 130 mg/dL — AB (ref 65–99)

## 2017-03-17 MED ORDER — FONDAPARINUX SODIUM 2.5 MG/0.5ML ~~LOC~~ SOLN
2.5000 mg | Freq: Every day | SUBCUTANEOUS | Status: DC
  Administered 2017-03-18 – 2017-03-20 (×3): 2.5 mg via SUBCUTANEOUS
  Filled 2017-03-17 (×3): qty 0.5

## 2017-03-17 NOTE — Evaluation (Signed)
Physical Therapy Evaluation Patient Details Name: Trevor Griffin MRN: [id number] DOB: November 08, 1955 Today's Date: 03/17/2017   History of Present Illness  Trevor Sawaya Whortonis a 61 y.o.malewith Past medical history ofCAD S/P PCI, COPD, lung cancer S/P lobectomy, tongue cancer S/P surgery, hypothyroidism, alcohol abuse, GERD, chronic systolic CHF.  Clinical Impression  Pt admitted with above. Ambulation limited due to L LE pain. Pt safe to amb with RW to help off weight L LE. Discussed stair negotiation but didn't complete due to pain this date. Acute PT to con't to follow.    Follow Up Recommendations No PT follow up;Supervision - Intermittent    Equipment Recommendations  None recommended by PT(pt has RW)    Recommendations for Other Services       Precautions / Restrictions Precautions Precautions: Fall Precaution Comments: L LE discolored and swollen Restrictions Weight Bearing Restrictions: No      Mobility  Bed Mobility Overal bed mobility: Modified Independent             General bed mobility comments: hob flat, did use bed rail but no physical assist needed  Transfers Overall transfer level: Needs assistance Equipment used: Rolling walker (2 wheeled) Transfers: Sit to/from Stand Sit to Stand: Min guard         General transfer comment: v/c's to push up from bed not pull up from walker  Ambulation/Gait Ambulation/Gait assistance: Min guard Ambulation Distance (Feet): 560 Feet Assistive device: Rolling walker (2 wheeled) Gait Pattern/deviations: Step-to pattern;Decreased stride length;Decreased stance time - left Gait velocity: slow Gait velocity interpretation: Below normal speed for age/gender General Gait Details: pt unable to place L foot flat during weightbearing, can only tolerated WBing on ball of foot. ambulation distance limted by pain  Stairs Stairs: (discussed up with the good/down with the bad)          Wheelchair Mobility     Modified Rankin (Stroke Patients Only)       Balance Overall balance assessment: Needs assistance Sitting-balance support: Feet supported;No upper extremity supported Sitting balance-Leahy Scale: Good Sitting balance - Comments: pt attempted to don socks in sitting but unable to complete L   Standing balance support: Single extremity supported Standing balance-Leahy Scale: Poor Standing balance comment: dependent on physical assist due to L LE PAIN                             Pertinent Vitals/Pain Pain Assessment: 0-10 Pain Score: 7 (10/10 s/p amb) Pain Location: L LE Pain Descriptors / Indicators: Sharp Pain Intervention(s): Repositioned    Home Living Family/patient expects to be discharged to:: Private residence Living Arrangements: Spouse/significant other Available Help at Discharge: Family;Available 24 hours/day Type of Home: House Home Access: Stairs to enter Entrance Stairs-Rails: Right;Left;Can reach both Entrance Stairs-Number of Steps: 3-5 Home Layout: One level Home Equipment: Walker - 2 wheels;Cane - single point      Prior Function Level of Independence: Independent with assistive device(s)         Comments: pt has been using cane over the last week due to L LE Pain     Hand Dominance   Dominant Hand: Right    Extremity/Trunk Assessment   Upper Extremity Assessment Upper Extremity Assessment: Overall WFL for tasks assessed    Lower Extremity Assessment Lower Extremity Assessment: LLE deficits/detail LLE Deficits / Details: unable to achieve terminal knee extension and ankle ROM due to swelling and pain    Cervical / Trunk  Assessment Cervical / Trunk Assessment: Normal  Communication   Communication: Expressive difficulties(due to previous surgeries for tongue cancer)  Cognition Arousal/Alertness: Awake/alert Behavior During Therapy: WFL for tasks assessed/performed Overall Cognitive Status: Within Functional Limits for  tasks assessed                                        General Comments General comments (skin integrity, edema, etc.): L LE with swelling, edema, and errythemia    Exercises     Assessment/Plan    PT Assessment Patient needs continued PT services  PT Problem List Decreased strength;Decreased activity tolerance;Decreased balance;Decreased mobility;Decreased range of motion;Decreased knowledge of use of DME;Pain       PT Treatment Interventions Gait training;DME instruction;Stair training;Functional mobility training;Therapeutic activities;Therapeutic exercise;Balance training    PT Goals (Current goals can be found in the Care Plan section)  Acute Rehab PT Goals Patient Stated Goal: stop the pain PT Goal Formulation: With patient Time For Goal Achievement: 03/24/17 Potential to Achieve Goals: Good    Frequency Min 3X/week   Barriers to discharge        Co-evaluation               AM-PAC PT "6 Clicks" Daily Activity  Outcome Measure Difficulty turning over in bed (including adjusting bedclothes, sheets and blankets)?: None Difficulty moving from lying on back to sitting on the side of the bed? : None Difficulty sitting down on and standing up from a chair with arms (e.g., wheelchair, bedside commode, etc,.)?: A Little Help needed moving to and from a bed to chair (including a wheelchair)?: A Little Help needed walking in hospital room?: A Little Help needed climbing 3-5 steps with a railing? : A Little 6 Click Score: 20    End of Session Equipment Utilized During Treatment: Gait belt Activity Tolerance: Patient limited by pain Patient left: in chair;with chair alarm set;with call bell/phone within reach Nurse Communication: Mobility status;Patient requests pain meds(increased pain) PT Visit Diagnosis: Unsteadiness on feet (R26.81);Pain Pain - Right/Left: Left Pain - part of body: Leg    Time: 6759-1638 PT Time Calculation (min) (ACUTE ONLY):  15 min   Charges:   PT Evaluation $PT Eval Low Complexity: 1 Low     PT G Codes:        Trevor Griffin, PT, DPT Pager #: [phone] Office #: [phone]   Trevor Griffin M Lakashia Griffin 03/17/2017, 9:17 AM

## 2017-03-17 NOTE — Progress Notes (Signed)
Patient: Trevor Griffin [id number]:[id number]   PCP: System, Pcp Not In DOB: 1955/12/29   DOA: 03/16/2017             DOS: 03/16/2017   DOS: the patient was seen and examined on 03/16/2017  Patient coming from: The patient is coming from home.  Chief Complaint: Left leg pain and swelling  HPI: Trevor Griffin is a 61 y.o. male with Past medical history of CAD S/P PCI, COPD, lung cancer S/P lobectomy, tongue cancer S/P surgery, hypothyroidism, alcohol abuse, GERD, chronic systolic CHF. Patient presented with complaints of left leg pain and swelling. Patient actually identified an area of redness on his left leg 3 months ago.  He felt he had a bug bite and was given oral Keflex for 7 days. That redness area spread further from his upper thigh involving his lower calf.  Patient was referred to dermatology.  No biopsy was performed but patient was given another prolonged course of antibiotics with Keflex. Patient continues to have this redness involving the left leg and was also referred to vascular surgery and was told that he has SVT. Since last 1 week patient has noted increasing pain involving the left leg both at rest as well as on ambulation. He also noted that the redness is not progressing involving the foot area. He denies any trauma or injury denies any procedures done on the leg.  No nausea no vomiting no fever no chills. He started noticing some cough for last 1 week as well. No chest pain at the time of my evaluation but he does have some tingling-like chest pain on-and-off on the left side resolving on its own. No abdominal pain no diarrhea no constipation. His wife has used Vicks VapoRub x2 in the last 3 months on his leg with improvement in his pain as well as also rubs an ointment on a daily basis.  Patient alert and oriented from home with his wife.  Left lower extremity is reddish, purple in color from inside of thigh to his ankle and is 2+edema. Patient has multiple scars on body  and dry elbows.  Patient speech is at times slurred from his previous tongue cancer and surgery.  Oriented to room and call bell within reach.  Patient states a pain of 8/10 in left lower extremity. Pain medication was administered.  Will continue to monitor.  Wilson Singer, RN

## 2017-03-17 NOTE — Progress Notes (Signed)
PROGRESS NOTE  Trevor Griffin [id number]:[id number] DOB: [birth year] DOA: 03/16/2017 PCP: System, Pcp Not In  HPI/Recap of past 16 hours: 61 year old male with a history of CAD status post PCI, COPD, lung cancer status post lobectomy, a tongue cancer status post surgery, hypothyroidism, alcohol abuse, GERD, chronic systolic CHF currently being managed for worsening left leg pain, swelling and erythema ongoing for about 3 months likely due to superficial venous thrombophlebitis.  Of note, patient has been treated for cellulitis on 2 separate occasions with prolonged courses of Keflex as an outpatient with no improvement.  Patient also noticed some cough for the past 1 week  Today, patient reports persistent pain on the left leg, with no significant improvement.  Patient denies any chest pain, shortness of breath, worsening cough, abdominal pain, nausea/vomiting, fever/chills, diarrhea.   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 Keflex, will treat as superficial thrombophlebitis as well as cellulitis Start fondaparinux 2.5 mg daily for a total of 45 days Continue IV vancomycin to cover for  cellulitis  #Community-acquired pneumonia History of 1 week of cough with shortness of breath CT angiogram showed possible multifocal pneumonia Continue ceftriaxone and azithromycin Blood culture no growth to date  #CAD Appears euvolemic Continue aspirin, Lipitor Continue to hold Lopressor due to soft blood pressure  #History of GERD Continue PPI  #Mood disorder Continue Wellbutrin  #History of tongue/lung cancer Outpatient follow-up with oncology     Code Status: Full  Family Communication: None at bedside  Disposition Plan: Once stable   Consultants:  Vascular  Procedures:  None  Antimicrobials:  IV vancomycin  DVT prophylaxis: Fondaparinux   Objective: Vitals:   03/16/17 2010 03/16/17 2013 03/17/17 0207 03/17/17 1416  BP: (!) 100/55 (!) 97/58  127/74  Pulse: (!) 106 (!) 104  91  Resp:    16  Temp:    (!) 97.4 F (36.3 C)  TempSrc:    Axillary  SpO2: 98% 98%  95%  Weight:   98.2 kg (216 lb 6.4 oz)   Height:   _0  (1.727 m)     Intake/Output Summary (Last 24 hours) at 03/17/2017 1826 Last data filed at 03/17/2017 1735 Gross per 24 hour  Intake 1600 ml  Output 340 ml  Net 1260 ml   Filed Weights   03/16/17 1954 03/17/17 0207  Weight: 98.2 kg (216 lb 9.6 oz) 98.2 kg (216 lb 6.4 oz)    Exam:   General: Awake, alert, oriented x3  Cardiovascular: S1-S2 present, no added heart sound  Respiratory: Chest clear bilaterally  Abdomen: Soft, nontender, non-distended, bowel sounds present  Musculoskeletal: Left lower extremity pain, swelling, erythema, pulses present  Skin: Left leg erythema from mid thigh all the way to foods, warm and tender to touch  Psychiatry: Normal mood  Data Reviewed: CBC: Recent Labs  Lab 03/16/17 1042 03/16/17 1739 03/17/17 0509  WBC 4.7 3.6* 4.9  NEUTROABS 3.6  --   --   HGB 9.2* 8.6* 8.2*  HCT 30.1* 28.2* 26.7*  MCV 83.6 83.2 84.5  PLT 228 194 832   Basic Metabolic Panel: Recent Labs  Lab  03/16/17 1042 03/16/17 1739 03/17/17 0509  NA 128* 130* 130*  K 4.3 4.1 4.5  CL 97* 100* 100*  CO2 24 21* 23  GLUCOSE 111* 86 97  BUN _0 CREATININE 1.09 0.99 1.13  CALCIUM 8.8* 8.4* 8.4*   GFR: Estimated Creatinine Clearance: 79 mL/min (by C-G formula based on SCr of 1.13 mg/dL). Liver Function Tests: Recent Labs  Lab 03/16/17 1042 03/17/17 0509  AST 19 21  ALT 12* 11*  ALKPHOS 73 66  BILITOT 1.2 1.2  PROT 7.1 6.0*  ALBUMIN 3.3* 2.8*   No results for input(s): LIPASE, AMYLASE in the last 168 hours. No results for input(s): AMMONIA in the last 168 hours. Coagulation Profile: No results for input(s): INR, PROTIME in the last 168 hours. Cardiac Enzymes: Recent Labs  Lab 03/16/17 1739 03/16/17 2339 03/17/17 0509  CKTOTAL 115  --   --   TROPONINI <0.03 <0.03 <0.03   BNP (last 3 results) No results for input(s): PROBNP in the last 8760 hours. HbA1C: No results for input(s): HGBA1C in the last 72 hours. CBG: Recent Labs  Lab 03/17/17 0749  GLUCAP 130*   Lipid Profile: No results for input(s): CHOL, HDL, LDLCALC, TRIG, CHOLHDL, LDLDIRECT in the last 72 hours. Thyroid Function Tests: Recent Labs    03/16/17 1739 03/16/17 1740  TSH  --  23.039*  FREET4 0.87  --    Anemia Panel: Recent Labs    03/16/17 1740  RETICCTPCT 3.3*   Urine analysis:    Component Value Date/Time   COLORURINE YELLOW 02/23/2014 1707   APPEARANCEUR CLEAR 02/23/2014 1707   LABSPEC 1.005 02/23/2014 1707   PHURINE 7.0 02/23/2014 1707   GLUCOSEU NEGATIVE 02/23/2014 1707   HGBUR NEGATIVE 02/23/2014 1707   BILIRUBINUR NEGATIVE 02/23/2014 1707   KETONESUR NEGATIVE 02/23/2014 1707   PROTEINUR NEGATIVE 02/23/2014 1707   UROBILINOGEN 0.2 02/23/2014 1707   NITRITE NEGATIVE 02/23/2014 1707   LEUKOCYTESUR NEGATIVE 02/23/2014 1707   Sepsis Labs: _1 (procalcitonin:4,lacticidven:4)  ) Recent Results (from the past 240 hour(s))  Culture, blood (routine x 2)     Status: None  (Preliminary result)   Collection Time: 03/16/17  5:39 PM  Result Value Ref Range Status   Specimen Description BLOOD RIGHT HAND  Final   Special Requests   Final    BOTTLES DRAWN AEROBIC AND ANAEROBIC Blood Culture adequate volume   Culture NO GROWTH < 24 HOURS  Final   Report Status PENDING  Incomplete  Culture, blood (routine x 2)     Status: None (Preliminary result)   Collection Time: 03/16/17  5:40 PM  Result Value Ref Range Status   Specimen Description BLOOD RIGHT ANTECUBITAL  Final   Special Requests   Final    BOTTLES DRAWN AEROBIC AND ANAEROBIC Blood Culture adequate volume   Culture NO GROWTH < 24 HOURS  Final   Report Status PENDING  Incomplete      Studies: [place] Lt Lower Extrem Ltd Soft Tissue Non Vascular  Result Date: 03/16/2017 CLINICAL DATA:  Evaluate for Baker cysts. Posterior left knee soft tissue swelling. EXAM: ULTRASOUND LEFT LOWER EXTREMITY LIMITED TECHNIQUE: Ultrasound examination of the lower extremity soft  tissues was performed in the area of clinical concern. COMPARISON:  None. FINDINGS: No soft tissue mass. No cyst. Specifically, no evidence of a Baker's cyst. There is nonspecific soft tissue edema. IMPRESSION: 1. No mass or popliteal cyst. Electronically Signed   By: Lajean Manes M.D.   On: 03/16/2017 19:35    Scheduled Meds: . aspirin  81 mg Oral Daily  . buPROPion  150 mg Oral BID  . docusate sodium  100 mg Oral BID  . [START ON 03/18/2017] fondaparinux (ARIXTRA) injection  2.5 mg Subcutaneous Q0600  . guaiFENesin  600 mg Oral BID  . levothyroxine  100 mcg Oral QAC breakfast  . pantoprazole  40 mg Oral Daily    Continuous Infusions: . azithromycin Stopped (03/17/17 1752)  . cefTRIAXone (ROCEPHIN)  IV Stopped (03/17/17 1745)  . vancomycin Stopped (03/17/17 1834)     LOS: 1 day     Alma Friendly, MD [facility]   If 7PM-7AM, please contact night-coverage [email or web address] Password [facility] 03/17/2017, 6:26 PM

## 2017-03-18 LAB — BASIC METABOLIC PANEL
ANION GAP: 8 (ref 5–15)
BUN: 6 mg/dL (ref 6–20)
CALCIUM: 8.5 mg/dL — AB (ref 8.9–10.3)
CO2: 24 mmol/L (ref 22–32)
Chloride: 97 mmol/L — ABNORMAL LOW (ref 101–111)
Creatinine, Ser: 0.99 mg/dL (ref 0.61–1.24)
Glucose, Bld: 134 mg/dL — ABNORMAL HIGH (ref 65–99)
POTASSIUM: 4.1 mmol/L (ref 3.5–5.1)
SODIUM: 129 mmol/L — AB (ref 135–145)

## 2017-03-18 LAB — CBC WITH DIFFERENTIAL/PLATELET
BASOS ABS: 0 10*3/uL (ref 0.0–0.1)
BASOS PCT: 0 %
EOS ABS: 0.2 10*3/uL (ref 0.0–0.7)
EOS PCT: 3 %
HCT: 26.4 % — ABNORMAL LOW (ref 39.0–52.0)
Hemoglobin: 8 g/dL — ABNORMAL LOW (ref 13.0–17.0)
LYMPHS PCT: 8 %
Lymphs Abs: 0.4 10*3/uL — ABNORMAL LOW (ref 0.7–4.0)
MCH: 25.4 pg — ABNORMAL LOW (ref 26.0–34.0)
MCHC: 30.3 g/dL (ref 30.0–36.0)
MCV: 83.8 fL (ref 78.0–100.0)
MONO ABS: 0.3 10*3/uL (ref 0.1–1.0)
Monocytes Relative: 7 %
Neutro Abs: 3.9 10*3/uL (ref 1.7–7.7)
Neutrophils Relative %: 82 %
PLATELETS: 188 10*3/uL (ref 150–400)
RBC: 3.15 MIL/uL — AB (ref 4.22–5.81)
RDW: 18.4 % — AB (ref 11.5–15.5)
WBC: 4.7 10*3/uL (ref 4.0–10.5)

## 2017-03-18 LAB — PROTIME-INR
INR: 1.24
PROTHROMBIN TIME: 15.5 s — AB (ref 11.4–15.2)

## 2017-03-18 LAB — STREP PNEUMONIAE URINARY ANTIGEN: Strep Pneumo Urinary Antigen: NEGATIVE

## 2017-03-18 MED ORDER — DIPHENHYDRAMINE HCL 25 MG PO CAPS
25.0000 mg | ORAL_CAPSULE | Freq: Four times a day (QID) | ORAL | Status: DC | PRN
  Administered 2017-03-18: 25 mg via ORAL
  Filled 2017-03-18: qty 1

## 2017-03-18 MED ORDER — HYDROCORTISONE 1 % EX CREA
TOPICAL_CREAM | CUTANEOUS | Status: DC | PRN
  Administered 2017-03-18: 04:00:00 via TOPICAL
  Filled 2017-03-18: qty 28

## 2017-03-18 NOTE — Progress Notes (Signed)
Pt c/o new onset itchy red rash covering body since yesterday morning. Paged MD on call. Will continue to monitor.

## 2017-03-18 NOTE — Progress Notes (Signed)
In and out cath performed at bedside with [facility] assisting. Peri care was performed before procedure, and sterile technique was maintained throughout. 600 ml of urine was obtained. Pt tolerated procedure well. Will continue to monitor.

## 2017-03-18 NOTE — Telephone Encounter (Signed)
Current admit 

## 2017-03-18 NOTE — Progress Notes (Signed)
PROGRESS NOTE  Trevor Griffin MPN:[id number] DOB: 11-24-1955 DOA: 03/16/2017 PCP: System, Pcp Not In  HPI/Recap of past 40 hours: 61 year old male with a history of CAD status post PCI, COPD, lung cancer status post lobectomy, a tongue cancer status post surgery, hypothyroidism, alcohol abuse, GERD, chronic systolic CHF currently being managed for worsening left leg pain, swelling and erythema ongoing for about 3 months likely due to superficial venous thrombophlebitis.  Of note, patient has been treated for cellulitis on 2 separate occasions with prolonged courses of Keflex as an outpatient with no improvement.  Patient also noticed some cough for the past 1 week  Today, patient reports persistent pain on the left leg, with no significant improvement.  Patient denies any chest pain, shortness of breath, worsening cough, abdominal pain, nausea/vomiting, fever/chills, diarrhea.   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 Keflex, will treat as superficial thrombophlebitis as well as cellulitis Start fondaparinux 2.5 mg daily for a total of 45 days Continue IV vancomycin to cover for  cellulitis  #Community-acquired pneumonia History of 1 week of cough with shortness of breath CT angiogram showed possible multifocal pneumonia Continue ceftriaxone and azithromycin Blood culture no growth to date  #CAD Appears euvolemic Continue aspirin, Lipitor Continue to hold Lopressor due to soft blood pressure  #History of GERD Continue PPI  #Mood disorder Continue Wellbutrin  #History of tongue/lung cancer Outpatient follow-up with oncology     Code Status: Full  Family Communication: None at bedside  Disposition Plan: Once stable   Consultants:  Vascular  Procedures:  None  Antimicrobials:  IV vancomycin  IV ceftriaxone  IV azithromycin  DVT prophylaxis: Fondaparinux   Objective: Vitals:   03/17/17 1416 03/17/17 2101 03/18/17 0527 03/18/17 1556  BP: 127/74 116/67 124/69 (!) 111/56  Pulse: 91 85 88 79  Resp: 16 18 18 18   Temp: (!) 97.4 F (36.3 C) 97.6 F (36.4 C) 98 F (36.7 C) 98 F (36.7 C)  TempSrc: Axillary Oral Oral Oral  SpO2: 95% 95% 99% 98%  Weight:      Height:        Intake/Output Summary (Last 24 hours) at 03/18/2017 1648 Last data filed at 03/18/2017 1557 Gross per 24 hour  Intake 750 ml  Output 600 ml  Net 150 ml   Filed Weights   03/16/17 1954 03/17/17 0207  Weight: 98.2 kg (216 lb 9.6 oz) 98.2 kg (216 lb 6.4 oz)    Exam:   General: Awake, alert, oriented x3  Cardiovascular: S1-S2 present, no added heart sound  Respiratory: Chest clear bilaterally  Abdomen: Soft, nontender, non-distended, bowel sounds present  Musculoskeletal: Left lower extremity pain, swelling, erythema, pulses present  Skin: Left leg erythema from mid thigh all the way to foods, warm and tender to touch  Psychiatry:  Normal mood   Data Reviewed: CBC: Recent Labs  Lab 03/16/17 1042 03/16/17 1739 03/17/17 0509 03/18/17 0654  WBC 4.7 3.6* 4.9 4.7  NEUTROABS 3.6  --   --  3.9  HGB 9.2* 8.6* 8.2* 8.0*  HCT 30.1* 28.2*  26.7* 26.4*  MCV 83.6 83.2 84.5 83.8  PLT 228 194 198 341   Basic Metabolic Panel: Recent Labs  Lab 03/16/17 1042 03/16/17 1739 03/17/17 0509 03/18/17 0654  NA 128* 130* 130* 129*  K 4.3 4.1 4.5 4.1  CL 97* 100* 100* 97*  CO2 24 21* 23 24  GLUCOSE 111* 86 97 134*  BUN 9 7 9 6   CREATININE 1.09 0.99 1.13 0.99  CALCIUM 8.8* 8.4* 8.4* 8.5*   GFR: Estimated Creatinine Clearance: 90.1 mL/min (by C-G formula based on SCr of 0.99 mg/dL). Liver Function Tests: Recent Labs  Lab 03/16/17 1042 03/17/17 0509  AST 19 21  ALT 12* 11*  ALKPHOS 73 66  BILITOT 1.2 1.2  PROT 7.1 6.0*  ALBUMIN 3.3* 2.8*   No results for input(s): LIPASE, AMYLASE in the last 168 hours. No results for input(s): AMMONIA in the last 168 hours. Coagulation Profile: Recent Labs  Lab 03/18/17 0654  INR 1.24   Cardiac Enzymes: Recent Labs  Lab 03/16/17 1739 03/16/17 2339 03/17/17 0509  CKTOTAL 115  --   --   TROPONINI <0.03 <0.03 <0.03   BNP (last 3 results) No results for input(s): PROBNP in the last 8760 hours. HbA1C: No results for input(s): HGBA1C in the last 72 hours. CBG: Recent Labs  Lab 03/17/17 0749  GLUCAP 130*   Lipid Profile: No results for input(s): CHOL, HDL, LDLCALC, TRIG, CHOLHDL, LDLDIRECT in the last 72 hours. Thyroid Function Tests: Recent Labs    03/16/17 1739 03/16/17 1740  TSH  --  23.039*  FREET4 0.87  --    Anemia Panel: Recent Labs    03/16/17 1740  RETICCTPCT 3.3*   Urine analysis:    Component Value Date/Time   COLORURINE YELLOW 02/23/2014 1707   APPEARANCEUR CLEAR 02/23/2014 1707   LABSPEC 1.005 02/23/2014 1707   PHURINE 7.0 02/23/2014 1707   GLUCOSEU NEGATIVE 02/23/2014 1707   HGBUR NEGATIVE 02/23/2014 1707   BILIRUBINUR NEGATIVE 02/23/2014 1707   KETONESUR NEGATIVE 02/23/2014 1707   PROTEINUR NEGATIVE 02/23/2014 1707   UROBILINOGEN 0.2 02/23/2014 1707   NITRITE NEGATIVE 02/23/2014 1707   LEUKOCYTESUR NEGATIVE 02/23/2014 1707   Sepsis  Labs: @LABRCNTIP (procalcitonin:4,lacticidven:4)  ) Recent Results (from the past 240 hour(s))  Culture, blood (routine x 2)     Status: None (Preliminary result)   Collection Time: 03/16/17  5:39 PM  Result Value Ref Range Status   Specimen Description BLOOD RIGHT HAND  Final   Special Requests   Final    BOTTLES DRAWN AEROBIC AND ANAEROBIC Blood Culture adequate volume   Culture NO GROWTH 2 DAYS  Final   Report Status PENDING  Incomplete  Culture, blood (routine x 2)     Status: None (Preliminary result)   Collection Time: 03/16/17  5:40 PM  Result Value Ref Range Status   Specimen Description BLOOD RIGHT ANTECUBITAL  Final   Special Requests   Final    BOTTLES DRAWN AEROBIC AND ANAEROBIC Blood Culture adequate volume   Culture NO GROWTH 2 DAYS  Final   Report Status PENDING  Incomplete      Studies: No results found.  Scheduled Meds: . aspirin  81 mg Oral Daily  . buPROPion  150 mg Oral  BID  . docusate sodium  100 mg Oral BID  . fondaparinux (ARIXTRA) injection  2.5 mg Subcutaneous Q0600  . guaiFENesin  600 mg Oral BID  . levothyroxine  100 mcg Oral QAC breakfast  . pantoprazole  40 mg Oral Daily    Continuous Infusions: . azithromycin Stopped (03/17/17 1752)  . cefTRIAXone (ROCEPHIN)  IV Stopped (03/17/17 1745)  . vancomycin 1,000 mg (03/18/17 0540)     LOS: 2 days     Alma Friendly, MD [facility]   If 7PM-7AM, please contact night-coverage [email or web address] Password [facility] 03/18/2017, 4:48 PM

## 2017-03-19 ENCOUNTER — Inpatient Hospital Stay (HOSPITAL_COMMUNITY): Payer: Medicare Other

## 2017-03-19 LAB — CBC WITH DIFFERENTIAL/PLATELET
BASOS ABS: 0 10*3/uL (ref 0.0–0.1)
Basophils Relative: 1 %
EOS ABS: 0.1 10*3/uL (ref 0.0–0.7)
EOS PCT: 3 %
HCT: 26.9 % — ABNORMAL LOW (ref 39.0–52.0)
Hemoglobin: 8.1 g/dL — ABNORMAL LOW (ref 13.0–17.0)
LYMPHS PCT: 10 %
Lymphs Abs: 0.4 10*3/uL — ABNORMAL LOW (ref 0.7–4.0)
MCH: 25.2 pg — AB (ref 26.0–34.0)
MCHC: 30.1 g/dL (ref 30.0–36.0)
MCV: 83.8 fL (ref 78.0–100.0)
MONO ABS: 0.4 10*3/uL (ref 0.1–1.0)
Monocytes Relative: 10 %
Neutro Abs: 2.8 10*3/uL (ref 1.7–7.7)
Neutrophils Relative %: 76 %
PLATELETS: 194 10*3/uL (ref 150–400)
RBC: 3.21 MIL/uL — ABNORMAL LOW (ref 4.22–5.81)
RDW: 18.2 % — AB (ref 11.5–15.5)
WBC: 3.6 10*3/uL — AB (ref 4.0–10.5)

## 2017-03-19 LAB — BASIC METABOLIC PANEL
ANION GAP: 6 (ref 5–15)
BUN: 5 mg/dL — ABNORMAL LOW (ref 6–20)
CO2: 25 mmol/L (ref 22–32)
Calcium: 8.2 mg/dL — ABNORMAL LOW (ref 8.9–10.3)
Chloride: 99 mmol/L — ABNORMAL LOW (ref 101–111)
Creatinine, Ser: 0.89 mg/dL (ref 0.61–1.24)
GFR calc Af Amer: >60 mL/min (ref >60)
GFR calc non Af Amer: >60 mL/min (ref >60)
GLUCOSE: 93 mg/dL (ref 65–99)
Potassium: 3.8 mmol/L (ref 3.5–5.1)
SODIUM: 130 mmol/L — AB (ref 135–145)

## 2017-03-19 LAB — LEGIONELLA PNEUMOPHILA SEROGP 1 UR AG: L. pneumophila Serogp 1 Ur Ag: NEGATIVE

## 2017-03-19 MED ORDER — IOPAMIDOL (ISOVUE-300) INJECTION 61%
INTRAVENOUS | Status: AC
Start: 2017-03-19 — End: 2017-03-19
  Administered 2017-03-19: 100 mL
  Filled 2017-03-19: qty 100

## 2017-03-19 NOTE — Progress Notes (Signed)
Physical Therapy Treatment Patient Details Name: Trevor Griffin MRN: [id number] DOB: May 05, 1956 Today's Date: 03/19/2017    History of Present Illness Trevor Griffin a 61 y.o.malewith Past medical history ofCAD S/P PCI, COPD, lung cancer S/P lobectomy, tongue cancer S/P surgery, hypothyroidism, alcohol abuse, GERD, chronic systolic CHF.    PT Comments    Pt progressing with gait, pain improved today but still incr with WBing L heel   Follow Up Recommendations  No PT follow up;Supervision - Intermittent     Equipment Recommendations  None recommended by PT    Recommendations for Other Services       Precautions / Restrictions Precautions Precautions: Fall Precaution Comments: LLE edematous  Restrictions Weight Bearing Restrictions: No    Mobility  Bed Mobility               General bed mobility comments: pt in chair  Transfers Overall transfer level: Needs assistance Equipment used: Rolling walker (2 wheeled) Transfers: Sit to/from Stand Sit to Stand: Min guard         General transfer comment: verbal  cues for correct hand placement and to control descent  Ambulation/Gait Ambulation/Gait assistance: Min guard;Supervision Ambulation Distance (Feet): 340 Feet Assistive device: Rolling walker (2 wheeled) Gait Pattern/deviations: Step-through pattern;Antalgic;Decreased weight shift to left;Decreased stride length Gait velocity: slow   General Gait Details: pt unable to place L foot flat during weightbearing, can only tolerate WBing on ball of foot, as gait progressed pt able to get near foot flat on L   Stairs            Wheelchair Mobility    Modified Rankin (Stroke Patients Only)       Balance             Standing balance-Leahy Scale: Poor Standing balance comment: reliant on UEs                             Cognition Arousal/Alertness: Awake/alert Behavior During Therapy: WFL for tasks  assessed/performed Overall Cognitive Status: Within Functional Limits for tasks assessed                                        Exercises General Exercises - Lower Extremity Toe Raises: AROM;Both;10 reps;Seated Heel Raises: AROM;Both;10 reps;Seated    General Comments        Pertinent Vitals/Pain Pain Assessment: 0-10 Pain Score: 7  Pain Location: L LE Pain Descriptors / Indicators: Sharp Pain Intervention(s): Monitored during session;Premedicated before session    Home Living                      Prior Function            PT Goals (current goals can now be found in the care plan section) Acute Rehab PT Goals Patient Stated Goal: stop the pain PT Goal Formulation: With patient Time For Goal Achievement: 03/24/17 Potential to Achieve Goals: Good Progress towards PT goals: Progressing toward goals    Frequency    Min 3X/week      PT Plan Current plan remains appropriate    Co-evaluation              AM-PAC PT "6 Clicks" Daily Activity  Outcome Measure  Difficulty turning over in bed (including adjusting bedclothes, sheets and blankets)?: None Difficulty moving from lying on  back to sitting on the side of the bed? : None Difficulty sitting down on and standing up from a chair with arms (e.g., wheelchair, bedside commode, etc,.)?: A Little Help needed moving to and from a bed to chair (including a wheelchair)?: A Little Help needed walking in hospital room?: A Little Help needed climbing 3-5 steps with a railing? : A Little 6 Click Score: 20    End of Session Equipment Utilized During Treatment: Gait belt Activity Tolerance: Patient tolerated treatment well Patient left: in chair;with chair alarm set;with call bell/phone within reach(batteries replaced in chair alarm box to stop constant beep)   PT Visit Diagnosis: Unsteadiness on feet (R26.81);Pain Pain - Right/Left: Left Pain - part of body: Leg     Time: 3643-8377 PT  Time Calculation (min) (ACUTE ONLY): 19 min  Charges:  $Gait Training: 8-22 mins                    G CodesKenyon Griffin, PT Pager: [phone] 03/19/2017    Trevor Griffin 03/19/2017, 1:39 PM

## 2017-03-19 NOTE — Progress Notes (Signed)
PROGRESS NOTE  Trevor Griffin LEX:[id number] DOB: January 27, 1956 DOA: 03/16/2017 PCP: System, Pcp Not In  HPI/Recap of past 53 hours: 61 year old male with a history of CAD status post PCI, COPD, lung cancer status post lobectomy, a tongue cancer status post surgery, hypothyroidism, alcohol abuse, GERD, chronic systolic CHF currently being managed for worsening left leg pain, swelling and erythema ongoing for about 3 months likely due to superficial venous thrombophlebitis.  Of note, patient has been treated for cellulitis on 2 separate occasions with prolonged courses of Keflex as an outpatient with no improvement.  Patient also noticed some cough for the past 1 week  Today, patient reports slightly improved pain on the left leg, with no significant improvement.  Patient denies any chest pain, shortness of breath, worsening cough, abdominal pain, nausea/vomiting, fever/chills, diarrhea.   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 Could be sup thrombophlebitis vs cellulitis  Vascular surgery on board, likely superficial thrombophlebitis, consider anticoagulation Due to patient's not improving on p.o. Keflex X2 courses, will treat as superficial thrombophlebitis  as well as cellulitis Start fondaparinux 2.5 mg daily for a total of 45 days Continue IV vancomycin to cover for cellulitis for 7 days PT/OT  #Community-acquired pneumonia Improved History of 1 week of cough with shortness of breath CT angiogram showed possible multifocal pneumonia Continue ceftriaxone and azithromycin for 5 days total Blood culture no growth to date  #CAD Appears euvolemic Continue aspirin, Lipitor Continue to hold Lopressor due to soft blood pressure  #History of GERD Continue PPI  #Mood disorder Continue Wellbutrin  #History of tongue/lung cancer Outpatient follow-up with oncology     Code Status: Full  Family Communication: Spoke to wife over the phone  Disposition Plan: Once stable   Consultants:  Vascular  Procedures:  None  Antimicrobials:  IV vancomycin  IV ceftriaxone  IV azithromycin  DVT prophylaxis: Fondaparinux   Objective: Vitals:   03/18/17 1556 03/18/17 2136 03/19/17 0510 03/19/17 1354  BP: (!) 111/56 (!) 106/55 128/72 111/65  Pulse: 79 87  95  Resp: _1 Temp: 98 F (36.7 C) 98.2 F (36.8 C) 98.4 F (36.9 C) 97.7 F (36.5 C)  TempSrc: Oral Oral Oral Oral  SpO2: 98% 96% 97% 100%  Weight:      Height:        Intake/Output Summary (Last 24 hours) at 03/19/2017 1529 Last data filed at 03/19/2017 1050 Gross per 24 hour  Intake 120 ml  Output 1750 ml  Net -1630 ml   Filed Weights   03/16/17 1954 03/17/17 0207  Weight: 98.2 kg (216 lb 9.6 oz) 98.2 kg (216 lb 6.4 oz)    Exam:   General: Awake, alert, oriented x3  Cardiovascular: S1-S2 present, no added heart sound  Respiratory: Chest clear bilaterally  Abdomen: Soft, nontender, non-distended,  bowel sounds present  Musculoskeletal: Left lower extremity pain, swelling, erythema, pulses present  Skin: Left leg erythema from mid thigh all the way to foot, tender to touch  Psychiatry: Normal mood   Data Reviewed: CBC: Recent Labs    Lab 03/16/17 1042 03/16/17 1739 03/17/17 0509 03/18/17 0654 03/19/17 0436  WBC 4.7 3.6* 4.9 4.7 3.6*  NEUTROABS 3.6  --   --  3.9 2.8  HGB 9.2* 8.6* 8.2* 8.0* 8.1*  HCT 30.1* 28.2* 26.7* 26.4* 26.9*  MCV 83.6 83.2 84.5 83.8 83.8  PLT 228 194 198 188 194   Basic Metabolic Panel: Recent Labs  Lab 03/16/17 1042 03/16/17 1739 03/17/17 0509 03/18/17 0654 03/19/17 0436  NA 128* 130* 130* 129* 130*  K 4.3 4.1 4.5 4.1 3.8  CL 97* 100* 100* 97* 99*  CO2 24 21* 23 24 25  GLUCOSE 111* 86 97 134* 93  BUN 9 7 9 6 5*  CREATININE 1.09 0.99 1.13 0.99 0.89  CALCIUM 8.8* 8.4* 8.4* 8.5* 8.2*   GFR: Estimated Creatinine Clearance: 100.2 mL/min (by C-G formula based on SCr of 0.89 mg/dL). Liver Function Tests: Recent Labs  Lab 03/16/17 1042 03/17/17 0509  AST 19 21  ALT 12* 11*  ALKPHOS 73 66  BILITOT 1.2 1.2  PROT 7.1 6.0*  ALBUMIN 3.3* 2.8*   No results for input(s): LIPASE, AMYLASE in the last 168 hours. No results for input(s): AMMONIA in the last 168 hours. Coagulation Profile: Recent Labs  Lab 03/18/17 0654  INR 1.24   Cardiac Enzymes: Recent Labs  Lab 03/16/17 1739 03/16/17 2339 03/17/17 0509  CKTOTAL 115  --   --   TROPONINI <0.03 <0.03 <0.03   BNP (last 3 results) No results for input(s): PROBNP in the last 8760 hours. HbA1C: No results for input(s): HGBA1C in the last 72 hours. CBG: Recent Labs  Lab 03/17/17 0749  GLUCAP 130*   Lipid Profile: No results for input(s): CHOL, HDL, LDLCALC, TRIG, CHOLHDL, LDLDIRECT in the last 72 hours. Thyroid Function Tests: Recent Labs    03/16/17 1739 03/16/17 1740  TSH  --  23.039*  FREET4 0.87  --    Anemia Panel: Recent Labs    03/16/17 1740  RETICCTPCT 3.3*   Urine analysis:    Component Value Date/Time   COLORURINE YELLOW 02/23/2014 1707   APPEARANCEUR CLEAR 02/23/2014 1707   LABSPEC 1.005 02/23/2014 1707   PHURINE 7.0 02/23/2014 1707   GLUCOSEU NEGATIVE 02/23/2014 1707   HGBUR NEGATIVE  02/23/2014 1707   BILIRUBINUR NEGATIVE 02/23/2014 1707   KETONESUR NEGATIVE 02/23/2014 1707   PROTEINUR NEGATIVE 02/23/2014 1707   UROBILINOGEN 0.2 02/23/2014 1707   NITRITE NEGATIVE 02/23/2014 1707   LEUKOCYTESUR NEGATIVE 02/23/2014 1707   Sepsis Labs: @LABRCNTIP(procalcitonin:4,lacticidven:4)  ) Recent Results (from the past 240 hour(s))  Culture, blood (routine x 2)     Status: None (Preliminary result)   Collection Time: 03/16/17  5:39 PM  Result Value Ref Range Status   Specimen Description BLOOD RIGHT HAND  Final   Special Requests   Final    BOTTLES DRAWN AEROBIC AND ANAEROBIC Blood Culture adequate volume   Culture NO GROWTH 3 DAYS  Final   Report Status PENDING  Incomplete  Culture, blood (routine x 2)     Status: None (Preliminary result)   Collection Time: 03/16/17  5:40 PM  Result Value Ref Range Status   Specimen Description BLOOD RIGHT ANTECUBITAL  Final   Special Requests   Final    BOTTLES   DRAWN AEROBIC AND ANAEROBIC Blood Culture adequate volume   Culture NO GROWTH 3 DAYS  Final   Report Status PENDING  Incomplete      Studies: Ct Extremity Lower Left W Contrast  Result Date: 03/19/2017 CLINICAL DATA:  Left lower leg swelling and redness. Superficial thrombophlebitis diagnosed on prior left lower extremity ultrasound in September 2018. EXAM: CT OF THE LOWER LEFT EXTREMITY WITH CONTRAST TECHNIQUE: Multidetector CT imaging of the lower left extremity was performed according to the standard protocol following intravenous contrast administration. COMPARISON:  Left lower extremity DVT ultrasound dated January 21, 2017. CONTRAST:  150m ISOVUE-300 IOPAMIDOL (ISOVUE-300) INJECTION 61% FINDINGS: Bones/Joint/Cartilage No acute fracture or malalignment. Joint spaces are preserved. No knee or ankle joint effusion. Ligaments Suboptimally assessed by CT. Muscles and Tendons No focal abnormality.  Visualized tendons are intact. Soft tissues Diffuse, circumferential soft  tissue swelling about the lower leg. No drainable fluid collection. Atherosclerotic vascular calcifications. The great saphenous vein appears occluded at the level of the ankle. Nonocclusive thrombus within the great saphenous vein below the knee. The great saphenous vein above the knee appears patent. IMPRESSION: 1. Diffuse, circumferential soft tissue swelling about the lower leg, nonspecific, but can be seen with cellulitis or superficial thrombophlebitis. No drainable fluid collection. 2. The great saphenous vein is patent above the knee, but contains nonocclusive thrombus below the knee, and appears occluded at the level of the ankle. Electronically Signed   By: WTitus DubinM.D.   On: 03/19/2017 12:56    Scheduled Meds: . aspirin  81 mg Oral Daily  . buPROPion  150 mg Oral BID  . docusate sodium  100 mg Oral BID  . fondaparinux (ARIXTRA) injection  2.5 mg Subcutaneous Q0600  . guaiFENesin  600 mg Oral BID  . levothyroxine  100 mcg Oral QAC breakfast  . pantoprazole  40 mg Oral Daily    Continuous Infusions: . azithromycin Stopped (03/19/17 0000)  . cefTRIAXone (ROCEPHIN)  IV Stopped (03/18/17 1800)  . vancomycin 1,000 mg (03/19/17 0502)     LOS: 3 days     NAlma Friendly MD [facility]   If 7PM-7AM, please contact night-coverage [email or web address] Password [email or web address] 03/19/2017, 3:29 PM

## 2017-03-19 NOTE — Progress Notes (Signed)
Pharmacy Antibiotic Note SEAMUS WAREHIME is a 61 y.o. male admitted on 03/16/2017 with concern for community acquired pneumonia and cellulitis. Currently on day 4 of ceftriaxone, azithromycin and vancomycin for treatment.   Plan: 1. Continue with current doses of abx for now  2. If vancomycin continued will need to check vancomycin level in next 24 - 48 hours   Height: 5\' 8"  (172.7 cm) Weight: 216 lb 6.4 oz (98.2 kg) IBW/kg (Calculated) : 68.4  Temp (24hrs), Avg:98.2 F (36.8 C), Min:98 F (36.7 C), Max:98.4 F (36.9 C)  Recent Labs  Lab 03/16/17 1042 03/16/17 1052 03/16/17 1739 03/17/17 0509 03/18/17 0654 03/19/17 0436  WBC 4.7  --  3.6* 4.9 4.7 3.6*  CREATININE 1.09  --  0.99 1.13 0.99 0.89  LATICACIDVEN  --  1.33  --   --   --   --     Estimated Creatinine Clearance: 100.2 mL/min (by C-G formula based on SCr of 0.89 mg/dL).    Allergies  Allergen Reactions  . Hydromorphone Nausea Only and Other (See Comments)    Headaches also  . Nubain [Nalbuphine Hcl] Nausea Only and Other (See Comments)    Headaches also    Antimicrobials this admission: Vancomycin 11/12 >> Ceftriaxone  11/12 >> Azithromycin 11/12 >>    Microbiology results: 11/12 BCx ngtd 11/12 strep pna: neg 11/12 HIV: negative   Thank you for allowing pharmacy to be a part of this patient's care.  Vincenza Hews, PharmD, BCPS 03/19/2017, 11:10 AM

## 2017-03-20 LAB — RETICULOCYTES
RBC.: 3.19 MIL/uL — ABNORMAL LOW (ref 4.22–5.81)
Retic Count, Absolute: 127.6 10*3/uL (ref 19.0–186.0)
Retic Ct Pct: 4 % — ABNORMAL HIGH (ref 0.4–3.1)

## 2017-03-20 LAB — CBC
HCT: 27 % — ABNORMAL LOW (ref 39.0–52.0)
HEMOGLOBIN: 8.2 g/dL — AB (ref 13.0–17.0)
MCH: 25.7 pg — AB (ref 26.0–34.0)
MCHC: 30.4 g/dL (ref 30.0–36.0)
MCV: 84.6 fL (ref 78.0–100.0)
Platelets: 211 10*3/uL (ref 150–400)
RBC: 3.19 MIL/uL — AB (ref 4.22–5.81)
RDW: 18.8 % — ABNORMAL HIGH (ref 11.5–15.5)
WBC: 3.9 10*3/uL — AB (ref 4.0–10.5)

## 2017-03-20 LAB — CBC WITH DIFFERENTIAL/PLATELET
BASOS PCT: 0 %
Basophils Absolute: 0 10*3/uL (ref 0.0–0.1)
Eosinophils Absolute: 0.1 10*3/uL (ref 0.0–0.7)
Eosinophils Relative: 4 %
HEMATOCRIT: 25.2 % — AB (ref 39.0–52.0)
Hemoglobin: 7.6 g/dL — ABNORMAL LOW (ref 13.0–17.0)
Lymphocytes Relative: 12 %
Lymphs Abs: 0.4 10*3/uL — ABNORMAL LOW (ref 0.7–4.0)
MCH: 25.3 pg — ABNORMAL LOW (ref 26.0–34.0)
MCHC: 30.2 g/dL (ref 30.0–36.0)
MCV: 84 fL (ref 78.0–100.0)
MONO ABS: 0.3 10*3/uL (ref 0.1–1.0)
MONOS PCT: 8 %
NEUTROS ABS: 2.6 10*3/uL (ref 1.7–7.7)
Neutrophils Relative %: 76 %
Platelets: 195 10*3/uL (ref 150–400)
RBC: 3 MIL/uL — ABNORMAL LOW (ref 4.22–5.81)
RDW: 18.8 % — AB (ref 11.5–15.5)
WBC: 3.4 10*3/uL — ABNORMAL LOW (ref 4.0–10.5)

## 2017-03-20 LAB — TYPE AND SCREEN
ABO/RH(D): O NEG
Antibody Screen: NEGATIVE

## 2017-03-20 LAB — BASIC METABOLIC PANEL
Anion gap: 8 (ref 5–15)
BUN: 5 mg/dL — AB (ref 6–20)
CALCIUM: 8.2 mg/dL — AB (ref 8.9–10.3)
CO2: 24 mmol/L (ref 22–32)
CREATININE: 0.94 mg/dL (ref 0.61–1.24)
Chloride: 97 mmol/L — ABNORMAL LOW (ref 101–111)
GFR calc non Af Amer: >60 mL/min (ref >60)
GLUCOSE: 90 mg/dL (ref 65–99)
Potassium: 4.1 mmol/L (ref 3.5–5.1)
Sodium: 129 mmol/L — ABNORMAL LOW (ref 135–145)

## 2017-03-20 LAB — FIBRINOGEN: Fibrinogen: 574 mg/dL — ABNORMAL HIGH (ref 210–475)

## 2017-03-20 MED ORDER — POLYETHYLENE GLYCOL 3350 17 G PO PACK
17.0000 g | PACK | Freq: Every day | ORAL | Status: DC
  Administered 2017-03-20 – 2017-03-22 (×3): 17 g via ORAL
  Filled 2017-03-20 (×3): qty 1

## 2017-03-20 MED ORDER — SENNOSIDES-DOCUSATE SODIUM 8.6-50 MG PO TABS
1.0000 | ORAL_TABLET | Freq: Two times a day (BID) | ORAL | Status: DC
  Administered 2017-03-20 – 2017-03-22 (×5): 1 via ORAL
  Filled 2017-03-20 (×5): qty 1

## 2017-03-20 MED ORDER — DOXYCYCLINE HYCLATE 100 MG PO TABS
100.0000 mg | ORAL_TABLET | Freq: Two times a day (BID) | ORAL | Status: DC
  Administered 2017-03-20 – 2017-03-22 (×5): 100 mg via ORAL
  Filled 2017-03-20 (×5): qty 1

## 2017-03-20 MED ORDER — RIVAROXABAN 10 MG PO TABS
10.0000 mg | ORAL_TABLET | ORAL | Status: DC
  Administered 2017-03-21 – 2017-03-22 (×2): 10 mg via ORAL
  Filled 2017-03-20 (×2): qty 1

## 2017-03-20 NOTE — Progress Notes (Signed)
PROGRESS NOTE  Trevor Griffin JGG:[id number] DOB: 03-05-1956 DOA: 03/16/2017 PCP: System, Pcp Not In  HPI/Recap of past 77 hours: 61 year old male with a history of CAD status post PCI, COPD, lung cancer status post lobectomy, a tongue cancer status post surgery, hypothyroidism, alcohol abuse, GERD, chronic systolic CHF currently being managed for worsening left leg pain, swelling and erythema ongoing for about 3 months likely due to superficial venous thrombophlebitis.  Of note, patient has been treated for cellulitis on 2 separate occasions with prolonged courses of Keflex as an outpatient with no improvement.  Patient also noticed some cough for the past 1 week  Leg pain is better, no BM.  No blood in the stool.  No nausea no vomiting.  Tolerating oral diet.   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 Could be sup thrombophlebitis vs cellulitis  Vascular surgery on board, likely superficial thrombophlebitis, consider anticoagulation Due to patient's not improving on p.o. Keflex X2 courses, will treat as superficial thrombophlebitis as well as cellulitis Patient was started on fondaparinux, due to lack of insurance coverage will change him to Xarelto after  discussion with vascular surgery. Also changing IV vancomycin to doxycycline.  #Community-acquired pneumonia Improved History of 1 week of cough with shortness of breath CT angiogram showed possible multifocal pneumonia Initially treated with ceftriaxone and azithromycin, changed to doxycycline. Blood culture no growth to date  #CAD Appears euvolemic Continue aspirin, Lipitor Continue to hold Lopressor due to soft blood pressure  #History of GERD Continue PPI  #Mood disorder Continue Wellbutrin  #History of tongue/lung cancer Outpatient follow-up with oncology  Anemia. Patient does appear to have chronic anemia, hemoglobin trending in the range of 8.  At this morning's hemoglobin was 7.6 but repeat is 8.2 without any intervention. At present I do not think that the patient actually has an active bleeding.  We will just monitor.   Code Status: Full  Family Communication: Spoke to wife over the phone  Disposition Plan: Once stable   Consultants:  Vascular  Procedures:  None  Antimicrobials:  IV vancomycin  IV ceftriaxone  IV azithromycin  DVT prophylaxis: Fondaparinux   Objective: Vitals:   03/20/17 0500 03/20/17 0553 03/20/17 0554 03/20/17 1523  BP:  96/60 100/60 101/85  Pulse:  87 86 83  Resp:  18  18  Temp:  98.4 F (36.9 C)  (!) 97.5 F (36.4 C)  TempSrc:  Oral  Oral  SpO2:  94% 98% 100%  Weight: 99.4 kg (219 lb 1.6 oz)     Height:        Intake/Output Summary (Last 24 hours) at 03/20/2017 1738 Last data filed at 03/20/2017 1330 Gross per 24 hour  Intake 480 ml  Output 1490 ml  Net -1010 ml   Filed Weights   03/16/17 1954 03/17/17 0207 03/20/17  0500  Weight: 98.2 kg (216 lb 9.6 oz) 98.2 kg (216 lb 6.4 oz) 99.4 kg (219 lb 1.6 oz)    Exam:   General: Awake, alert, oriented x3  Cardiovascular: S1-S2 present, no added heart sound  Respiratory: Chest clear bilaterally  Abdomen: Soft, nontender, non-distended, bowel sounds  present  Musculoskeletal: Left lower extremity pain, swelling, erythema, pulses present  Skin: Left leg erythema from mid thigh all the way to foot, tender to touch  Psychiatry: Normal mood   Data Reviewed: CBC: Recent Labs  Lab 03/16/17 1042  03/17/17 0509 03/18/17 0654 03/19/17 0436 03/20/17 0308 03/20/17 0908  WBC 4.7   < > 4.9 4.7 3.6* 3.4* 3.9*  NEUTROABS 3.6  --   --  3.9 2.8 2.6  --   HGB 9.2*   < > 8.2* 8.0* 8.1* 7.6* 8.2*  HCT 30.1*   < > 26.7* 26.4* 26.9* 25.2* 27.0*  MCV 83.6   < > 84.5 83.8 83.8 84.0 84.6  PLT 228   < > 198 188 194 195 211   < > = values in this interval not displayed.   Basic Metabolic Panel: Recent Labs  Lab 03/16/17 1739 03/17/17 0509 03/18/17 0654 03/19/17 0436 03/20/17 0308  NA 130* 130* 129* 130* 129*  K 4.1 4.5 4.1 3.8 4.1  CL 100* 100* 97* 99* 97*  CO2 21* 23 24 25 24   GLUCOSE 86 97 134* 93 90  BUN 7 9 6  5* 5*  CREATININE 0.99 1.13 0.99 0.89 0.94  CALCIUM 8.4* 8.4* 8.5* 8.2* 8.2*   GFR: Estimated Creatinine Clearance: 95.5 mL/min (by C-G formula based on SCr of 0.94 mg/dL). Liver Function Tests: Recent Labs  Lab 03/16/17 1042 03/17/17 0509  AST 19 21  ALT 12* 11*  ALKPHOS 73 66  BILITOT 1.2 1.2  PROT 7.1 6.0*  ALBUMIN 3.3* 2.8*   No results for input(s): LIPASE, AMYLASE in the last 168 hours. No results for input(s): AMMONIA in the last 168 hours. Coagulation Profile: Recent Labs  Lab 03/18/17 0654  INR 1.24   Cardiac Enzymes: Recent Labs  Lab 03/16/17 1739 03/16/17 2339 03/17/17 0509  CKTOTAL 115  --   --   TROPONINI <0.03 <0.03 <0.03   BNP (last 3 results) No results for input(s): PROBNP in the last 8760 hours. HbA1C: No results for input(s): HGBA1C in the last 72 hours. CBG: Recent Labs  Lab 03/17/17 0749  GLUCAP 130*   Lipid Profile: No results for input(s): CHOL, HDL, LDLCALC, TRIG, CHOLHDL, LDLDIRECT in the last 72 hours. Thyroid Function Tests: No results for input(s): TSH, T4TOTAL,  FREET4, T3FREE, THYROIDAB in the last 72 hours. Anemia Panel: Recent Labs    03/20/17 0908  RETICCTPCT 4.0*   Urine analysis:    Component Value Date/Time   COLORURINE YELLOW 02/23/2014 1707   APPEARANCEUR CLEAR 02/23/2014 1707   LABSPEC 1.005 02/23/2014 1707   PHURINE 7.0 02/23/2014 1707   GLUCOSEU NEGATIVE 02/23/2014 1707   HGBUR NEGATIVE 02/23/2014 1707   BILIRUBINUR NEGATIVE 02/23/2014 1707   KETONESUR NEGATIVE 02/23/2014 1707   PROTEINUR NEGATIVE 02/23/2014 1707   UROBILINOGEN 0.2 02/23/2014 1707   NITRITE NEGATIVE 02/23/2014 1707   LEUKOCYTESUR NEGATIVE 02/23/2014 1707   Sepsis Labs: @LABRCNTIP (procalcitonin:4,lacticidven:4)  ) Recent Results (from the past 240 hour(s))  Culture, blood (routine x 2)     Status: None (Preliminary result)   Collection Time: 03/16/17  5:39 PM  Result Value Ref Range Status   Specimen Description BLOOD RIGHT HAND  Final  Special Requests   Final    BOTTLES DRAWN AEROBIC AND ANAEROBIC Blood Culture adequate volume   Culture NO GROWTH 4 DAYS  Final   Report Status PENDING  Incomplete  Culture, blood (routine x 2)     Status: None (Preliminary result)   Collection Time: 03/16/17  5:40 PM  Result Value Ref Range Status   Specimen Description BLOOD RIGHT ANTECUBITAL  Final   Special Requests   Final    BOTTLES DRAWN AEROBIC AND ANAEROBIC Blood Culture adequate volume   Culture NO GROWTH 4 DAYS  Final   Report Status PENDING  Incomplete      Studies: No results found.  Scheduled Meds: . aspirin  81 mg Oral Daily  . buPROPion  150 mg Oral BID  . docusate sodium  100 mg Oral BID  . doxycycline  100 mg Oral Q12H  . fondaparinux (ARIXTRA) injection  2.5 mg Subcutaneous Q0600  . guaiFENesin  600 mg Oral BID  . levothyroxine  100 mcg Oral QAC breakfast  . pantoprazole  40 mg Oral Daily  . polyethylene glycol  17 g Oral Daily  . senna-docusate  1 tablet Oral BID    Continuous Infusions:    LOS: 4 days     Berle Mull,  MD [facility]   If 7PM-7AM, please contact night-coverage [email or web address] Password [facility] 03/20/2017, 5:38 PM

## 2017-03-20 NOTE — Care Management Important Message (Signed)
Important Message  Patient Details  Name: Trevor Griffin MRN: [id number] Date of Birth: Apr 03, 1956   Medicare Important Message Given:  Yes    Nathen May 03/20/2017, 12:29 PM

## 2017-03-20 NOTE — Progress Notes (Signed)
Benefits check in process: Please check  copay ARIXTRA 2.5 mg everyday x 40 days. CM to f/u with results. Whitman Hero RN,BSN,CM

## 2017-03-20 NOTE — Progress Notes (Signed)
CMA made CM aware pt without prescription coverage. CM called [facility] pharmacy and learned one  Arixtra 2.5 mg injection would cost $ 78.00 without insurance. CM made MD aware.  Whitman Hero RN,BSN,CM

## 2017-03-20 NOTE — Progress Notes (Signed)
ANTICOAGULATION CONSULT NOTE - Initial Consult  Pharmacy Consult for Xarelto Indication: symptomatic superficial vein thrombosis (no DVT)  Allergies  Allergen Reactions  . Hydromorphone Nausea Only and Other (See Comments)    Headaches also  . Nubain [Nalbuphine Hcl] Nausea Only and Other (See Comments)    Headaches also    Patient Measurements: Height: 5\' 8"  (172.7 cm) Weight: 219 lb 1.6 oz (99.4 kg) IBW/kg (Calculated) : 68.4  Vital Signs: Temp: 97.5 F (36.4 C) (11/16 1523) Temp Source: Oral (11/16 1523) BP: 101/85 (11/16 1523) Pulse Rate: 83 (11/16 1523)  Labs: Recent Labs    03/18/17 0654 03/19/17 0436 03/20/17 0308 03/20/17 0908  HGB 8.0* 8.1* 7.6* 8.2*  HCT 26.4* 26.9* 25.2* 27.0*  PLT 188 194 195 211  LABPROT 15.5*  --   --   --   INR 1.24  --   --   --   CREATININE 0.99 0.89 0.94  --     Estimated Creatinine Clearance: 95.5 mL/min (by C-G formula based on SCr of 0.94 mg/dL).   Medical History: Past Medical History:  Diagnosis Date  . Acute ST elevation myocardial infarction (STEMI) involving left anterior descending (LAD) coronary artery ([place]) 2008 and 2009   5/'08 - DES x 4 to p-m LAD; 2/'09 - PTCA of  in-stent thrombosis  . Alcohol abuse   . Anxiety   . CAD S/P percutaneous coronary angioplasty 2008   a. Ant STEMI 5/08 w/ 4 Taxus DES p-m LAD (3.0 x 16,16,24,28 mm) ;  b. 2/'09:  Stopped Plavix-> Ant STEMI/stent thrombosis in LAD (PTCA - 3.8 mm), LCX 77m/d;  c. 10/12: Cath/PCI: p-m LAD stents 10-20% ISR, mLAD 40%, CFX 30-40%, mCFX 90% (Promus Premier DES 3.0 mm x 20 mm - 3.21mm ), EF 35%; d. cath 2/17/'16 no Sig CAD, patent LAD & Cx stents, EF 40-45%  . Chronic pain   . COPD (chronic obstructive pulmonary disease) ([place])   . Depression   . Fatigue    Sleep study 6/08 was negative. Sleep study 5/13 showed no OSA.  Marland Kitchen GERD (gastroesophageal reflux disease)   . Headache(784.0)    migraines  . Hx of radiation therapy 10/25/12-12/10/12    Right Tongue/  Bilateral Neck //Total Dose, 60 Gy in 30 Fractions  . Hyperlipidemia   . Hypertension   . Ischemic cardiomyopathy    a. Echo 08/20/11 with EF 50-55% (previously 35%);  b. 02/2014 Echo: EF 45-50-%, mid-apicalanteroseptal AK, Gr 1 dd, mild AI, midlly dil LA.  . Lung cancer ([place]) dx'd 2015  . Obesity   . Tobacco abuse   . 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 aspirin  81 mg Oral Daily  . buPROPion  150 mg Oral BID  . docusate sodium  100 mg Oral BID  . doxycycline  100 mg Oral Q12H  . fondaparinux (ARIXTRA) injection  2.5 mg Subcutaneous Q0600  . guaiFENesin  600 mg Oral BID  . levothyroxine  100 mcg Oral QAC breakfast  . pantoprazole  40 mg Oral Daily  . polyethylene glycol  17 g Oral Daily  . senna-docusate  1 tablet Oral BID   Infusions:    Assessment: 61 yo M admitted with persistent LLE swelling, pain, and erythema.  Pt is s/p two treatment courses of Keflex for presumed LE cellulitis.  Pt was started on Arixtra for symptomatic superficial thrombophlebitis (doppler negative DVT) with intended length of therapy of 45 days.  Rx asked to switch to Xarelto for this indication given outpatient cost of Arixtra.  Last Arixtra dose given 11/16 at 0545.  Goal of Therapy:  symptom improvement, DVT prevention Monitor platelets by anticoagulation protocol: Yes   Plan:  Start Xarelto 10mg  daily - first dose 11/17 AM. [place], Pharm.D., BCPS Clinical Pharmacist  03/20/2017 5:25 PM

## 2017-03-21 ENCOUNTER — Other Ambulatory Visit: Payer: Self-pay | Admitting: Oncology

## 2017-03-21 LAB — BASIC METABOLIC PANEL
Anion gap: 6 (ref 5–15)
BUN: 5 mg/dL — AB (ref 6–20)
CHLORIDE: 97 mmol/L — AB (ref 101–111)
CO2: 25 mmol/L (ref 22–32)
Calcium: 8.2 mg/dL — ABNORMAL LOW (ref 8.9–10.3)
Creatinine, Ser: 0.82 mg/dL (ref 0.61–1.24)
GFR calc Af Amer: >60 mL/min (ref >60)
GFR calc non Af Amer: >60 mL/min (ref >60)
GLUCOSE: 95 mg/dL (ref 65–99)
POTASSIUM: 4.3 mmol/L (ref 3.5–5.1)
Sodium: 128 mmol/L — ABNORMAL LOW (ref 135–145)

## 2017-03-21 LAB — CBC
HCT: 26.9 % — ABNORMAL LOW (ref 39.0–52.0)
HEMATOCRIT: 24.8 % — AB (ref 39.0–52.0)
HEMOGLOBIN: 8 g/dL — AB (ref 13.0–17.0)
Hemoglobin: 7.5 g/dL — ABNORMAL LOW (ref 13.0–17.0)
MCH: 25.4 pg — ABNORMAL LOW (ref 26.0–34.0)
MCH: 25.2 pg — AB (ref 26.0–34.0)
MCHC: 30.2 g/dL (ref 30.0–36.0)
MCHC: 29.7 g/dL — ABNORMAL LOW (ref 30.0–36.0)
MCV: 84.1 fL (ref 78.0–100.0)
MCV: 84.9 fL (ref 78.0–100.0)
Platelets: 186 10*3/uL (ref 150–400)
Platelets: 230 10*3/uL (ref 150–400)
RBC: 2.95 MIL/uL — ABNORMAL LOW (ref 4.22–5.81)
RBC: 3.17 MIL/uL — AB (ref 4.22–5.81)
RDW: 19 % — AB (ref 11.5–15.5)
RDW: 19.2 % — ABNORMAL HIGH (ref 11.5–15.5)
WBC: 4 10*3/uL (ref 4.0–10.5)
WBC: 4.8 10*3/uL (ref 4.0–10.5)

## 2017-03-21 LAB — TECHNOLOGIST SMEAR REVIEW

## 2017-03-21 LAB — LACTATE DEHYDROGENASE: LDH: 222 U/L — AB (ref 98–192)

## 2017-03-21 LAB — CULTURE, BLOOD (ROUTINE X 2)
CULTURE: NO GROWTH
Culture: NO GROWTH
Special Requests: ADEQUATE
Special Requests: ADEQUATE

## 2017-03-21 LAB — IRON AND TIBC
IRON: 40 ug/dL — AB (ref 45–182)
SATURATION RATIOS: 13 % — AB (ref 17.9–39.5)
TIBC: 319 ug/dL (ref 250–450)
UIBC: 279 ug/dL

## 2017-03-21 LAB — DIRECT ANTIGLOBULIN TEST (NOT AT ARMC)
DAT, IgG: NEGATIVE
DAT, complement: NEGATIVE

## 2017-03-21 LAB — FERRITIN: FERRITIN: 230 ng/mL (ref 24–336)

## 2017-03-21 MED ORDER — MAGNESIUM CITRATE PO SOLN
1.0000 | Freq: Once | ORAL | Status: AC
  Administered 2017-03-21: 1 via ORAL
  Filled 2017-03-21: qty 296

## 2017-03-21 NOTE — Progress Notes (Signed)
PROGRESS NOTE  Trevor Griffin QKM:[id number] DOB: 1955-09-17 DOA: 03/16/2017 PCP: System, Pcp Not In  HPI/Recap of past 46 hours: 61 year old male with a history of CAD status post PCI, COPD, lung cancer status post lobectomy, a tongue cancer status post surgery, hypothyroidism, alcohol abuse, GERD, chronic systolic CHF currently being managed for worsening left leg pain, swelling and erythema ongoing for about 3 months likely due to superficial venous thrombophlebitis.  Of note, patient has been treated for cellulitis on 2 separate occasions with prolonged courses of Keflex as an outpatient with no improvement.  Patient also noticed some cough for the past 1 week  Severely constipated and nauseated.  Unable to keep anything down this morning.  Passing gas   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)   Microcytic anemia   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) ([facility])   Acquired hypothyroidism   Iron deficiency anemia   History of lung cancer  #Superficial thrombophlebitis  Persistent Covering for cellulitis as well ESR, CRP elevated CK within normal limits, blood culture no growth to date Lower extremity Doppler showed superficial thrombophlebitis, no DVT Ultrasound soft tissue of lower extremity, negative for ruptured Baker's cyst, or any mass\ CT Lower extremity, non-occlusive thrombus in the great saphenous vein. Could be sup thrombophlebitis vs cellulitis  Vascular surgery on board, likely superficial thrombophlebitis, consider anticoagulation Due to patient's not improving on p.o. Keflex X2 courses, will treat as superficial thrombophlebitis as well as cellulitis Patient was started on fondaparinux, due to lack of insurance coverage will change him to Xarelto after  discussion with vascular surgery. Also changing IV vancomycin to doxycycline.  #Community-acquired pneumonia Improved History of 1 week of cough with shortness of breath CT angiogram showed possible multifocal pneumonia Initially treated with ceftriaxone and azithromycin, changed to doxycycline. Blood culture no growth to date  #CAD Appears euvolemic Continue aspirin, Lipitor Continue to hold Lopressor due to soft blood pressure  #History of GERD Continue PPI  #Mood disorder Continue Wellbutrin  #History of tongue/lung cancer Outpatient follow-up with oncology  Anemia. Patient does appear to have chronic anemia, hemoglobin trending in the range of 8.  At this morning's hemoglobin was 7.6 but repeat is 8.2 without any intervention. At present I do not think that the patient actually has an active bleeding.  Discussed with on-call oncology, negative Coombs test, LDH marginally elevated.  No further workup recommended.  Nausea  Without vomiting with constipation. Bowel regimen.  If no benefit will perform enema.   Code Status: Full  Family Communication: Spoke to wife over the phone  Disposition Plan: Once stable   Consultants:  Vascular  Procedures:  None  Antimicrobials:  IV vancomycin  IV ceftriaxone  IV azithromycin  DVT prophylaxis: Fondaparinux   Objective: Vitals:   03/20/17 1523 03/20/17 2212 03/21/17 0600 03/21/17 1326  BP: 101/85 (!) 108/58 133/84 124/62  Pulse: 83 82 (!) 103 91  Resp: _0 Temp: (!) 97.5 F (36.4 C) 98.3 F (36.8 C) 98.4 F (36.9 C) 97.8 F (36.6 C)  TempSrc: Oral Oral Oral   SpO2: 100% 95% 96% 100%  Weight:   101.3 kg (223 lb 6.4 oz)   Height:        Intake/Output Summary (Last 24 hours) at 03/21/2017 1609 Last data filed at  03/21/2017 1000 Gross per 24 hour  Intake 340 ml  Output 1250 ml  Net -910 ml   Filed Weights   03/17/17 0207 03/20/17 0500 03/21/17 0600  Weight: 98.2 kg (216 lb 6.4 oz) 99.4  kg (219 lb 1.6 oz) 101.3 kg (223 lb 6.4 oz)    Exam:   General: Awake, alert, oriented x3  Cardiovascular: S1-S2 present, no added heart sound  Respiratory: Chest clear bilaterally  Abdomen: Soft, nontender, non-distended, bowel sounds present  Musculoskeletal: Left lower extremity pain, swelling, erythema, pulses present  Skin: Left leg erythema from mid thigh all the way to foot, tender to touch  Psychiatry: Normal mood   Data Reviewed: CBC: Recent Labs  Lab 03/16/17 1042  03/18/17 0654 03/19/17 0436 03/20/17 0308 03/20/17 0908 03/21/17 0539 03/21/17 0925  WBC 4.7   < > 4.7 3.6* 3.4* 3.9* 4.0 4.8  NEUTROABS 3.6  --  3.9 2.8 2.6  --   --   --   HGB 9.2*   < > 8.0* 8.1* 7.6* 8.2* 7.5* 8.0*  HCT 30.1*   < > 26.4* 26.9* 25.2* 27.0* 24.8* 26.9*  MCV 83.6   < > 83.8 83.8 84.0 84.6 84.1 84.9  PLT 228   < > 188 194 195 211 186 230   < > = values in this interval not displayed.   Basic Metabolic Panel: Recent Labs  Lab 03/17/17 0509 03/18/17 0654 03/19/17 0436 03/20/17 0308 03/21/17 0539  NA 130* 129* 130* 129* 128*  K 4.5 4.1 3.8 4.1 4.3  CL 100* 97* 99* 97* 97*  CO2 _0 GLUCOSE 97 134* 93 90 95  BUN 9 6 5* 5* 5*  CREATININE 1.13 0.99 0.89 0.94 0.82  CALCIUM 8.4* 8.5* 8.2* 8.2* 8.2*   GFR: Estimated Creatinine Clearance: 110.6 mL/min (by C-G formula based on SCr of 0.82 mg/dL). Liver Function Tests: Recent Labs  Lab 03/16/17 1042 03/17/17 0509  AST 19 21  ALT 12* 11*  ALKPHOS 73 66  BILITOT 1.2 1.2  PROT 7.1 6.0*  ALBUMIN 3.3* 2.8*   No results for input(s): LIPASE, AMYLASE in the last 168 hours. No results for input(s): AMMONIA in the last 168 hours. Coagulation Profile: Recent Labs  Lab 03/18/17 0654  INR 1.24   Cardiac Enzymes: Recent Labs  Lab 03/16/17 1739 03/16/17 2339 03/17/17 0509  CKTOTAL 115  --   --   TROPONINI <0.03 <0.03 <0.03   BNP (last 3 results) No results for input(s): PROBNP in the last 8760  hours. HbA1C: No results for input(s): HGBA1C in the last 72 hours. CBG: Recent Labs  Lab 03/17/17 0749  GLUCAP 130*   Lipid Profile: No results for input(s): CHOL, HDL, LDLCALC, TRIG, CHOLHDL, LDLDIRECT in the last 72 hours. Thyroid Function Tests: No results for input(s): TSH, T4TOTAL, FREET4, T3FREE, THYROIDAB in the last 72 hours. Anemia Panel: Recent Labs    03/20/17 0908 03/21/17 0925  FERRITIN  --  230  TIBC  --  319  IRON  --  40*  RETICCTPCT 4.0*  --    Urine analysis:    Component Value Date/Time   COLORURINE YELLOW 02/23/2014 [place] 02/23/2014 1707   LABSPEC 1.005 02/23/2014 1707   PHURINE 7.0 02/23/2014 1707   GLUCOSEU NEGATIVE 02/23/2014 1707   HGBUR NEGATIVE 02/23/2014 1707   BILIRUBINUR NEGATIVE 02/23/2014 1707   KETONESUR NEGATIVE 02/23/2014 1707   PROTEINUR NEGATIVE 02/23/2014 1707   UROBILINOGEN 0.2 02/23/2014 1707  NITRITE NEGATIVE 02/23/2014 1707   LEUKOCYTESUR NEGATIVE 02/23/2014 1707   Sepsis Labs: _0 (procalcitonin:4,lacticidven:4)  ) Recent Results (from the past 240 hour(s))  Culture, blood (routine x 2)     Status: None (Preliminary result)   Collection Time: 03/16/17  5:39 PM  Result Value Ref Range Status   Specimen Description BLOOD RIGHT HAND  Final   Special Requests   Final    BOTTLES DRAWN AEROBIC AND ANAEROBIC Blood Culture adequate volume   Culture NO GROWTH 4 DAYS  Final   Report Status PENDING  Incomplete  Culture, blood (routine x 2)     Status: None (Preliminary result)   Collection Time: 03/16/17  5:40 PM  Result Value Ref Range Status   Specimen Description BLOOD RIGHT ANTECUBITAL  Final   Special Requests   Final    BOTTLES DRAWN AEROBIC AND ANAEROBIC Blood Culture adequate volume   Culture NO GROWTH 4 DAYS  Final   Report Status PENDING  Incomplete      Studies: No results found.  Scheduled Meds: . aspirin  81 mg Oral Daily  . buPROPion  150 mg Oral BID  . docusate sodium  100 mg  Oral BID  . doxycycline  100 mg Oral Q12H  . guaiFENesin  600 mg Oral BID  . levothyroxine  100 mcg Oral QAC breakfast  . pantoprazole  40 mg Oral Daily  . polyethylene glycol  17 g Oral Daily  . rivaroxaban  10 mg Oral Q24H  . senna-docusate  1 tablet Oral BID    Continuous Infusions:    LOS: 5 days     Berle Mull, MD [facility]   If 7PM-7AM, please contact night-coverage [email or web address] Password [email or web address] 03/21/2017, 4:09 PM

## 2017-03-22 LAB — CBC
HCT: 23.2 % — ABNORMAL LOW (ref 39.0–52.0)
HEMOGLOBIN: 7 g/dL — AB (ref 13.0–17.0)
MCH: 25.5 pg — AB (ref 26.0–34.0)
MCHC: 30.2 g/dL (ref 30.0–36.0)
MCV: 84.4 fL (ref 78.0–100.0)
PLATELETS: 191 10*3/uL (ref 150–400)
RBC: 2.75 MIL/uL — ABNORMAL LOW (ref 4.22–5.81)
RDW: 19.3 % — ABNORMAL HIGH (ref 11.5–15.5)
WBC: 3.9 10*3/uL — ABNORMAL LOW (ref 4.0–10.5)

## 2017-03-22 LAB — BASIC METABOLIC PANEL
ANION GAP: 7 (ref 5–15)
BUN: 7 mg/dL (ref 6–20)
CALCIUM: 8.2 mg/dL — AB (ref 8.9–10.3)
CO2: 26 mmol/L (ref 22–32)
CREATININE: 0.9 mg/dL (ref 0.61–1.24)
Chloride: 97 mmol/L — ABNORMAL LOW (ref 101–111)
GFR calc Af Amer: >60 mL/min (ref >60)
GLUCOSE: 94 mg/dL (ref 65–99)
Potassium: 4.2 mmol/L (ref 3.5–5.1)
Sodium: 130 mmol/L — ABNORMAL LOW (ref 135–145)

## 2017-03-22 MED ORDER — GUAIFENESIN ER 600 MG PO TB12: 600.0000 mg | ORAL_TABLET | Freq: Two times a day (BID) | ORAL | 0 refills | Status: DC

## 2017-03-22 MED ORDER — DOXYCYCLINE HYCLATE 100 MG PO TABS: 100.0000 mg | ORAL_TABLET | Freq: Two times a day (BID) | ORAL | 0 refills | Status: DC

## 2017-03-22 MED ORDER — HYDROCODONE-ACETAMINOPHEN 5-325 MG PO TABS: 1.0000 | ORAL_TABLET | ORAL | 0 refills | Status: DC | PRN

## 2017-03-22 MED ORDER — RIVAROXABAN 10 MG PO TABS: 10.0000 mg | ORAL_TABLET | Freq: Every day | ORAL | 0 refills | Status: DC

## 2017-03-22 MED ORDER — RIVAROXABAN 10 MG PO TABS: 10.0000 mg | ORAL_TABLET | ORAL | 0 refills | Status: DC

## 2017-03-22 MED ORDER — POLYETHYLENE GLYCOL 3350 17 G PO PACK: 17.0000 g | PACK | Freq: Every day | ORAL | 0 refills | Status: DC

## 2017-03-22 MED ORDER — DOXYCYCLINE HYCLATE 100 MG PO TABS: 100.0000 mg | ORAL_TABLET | Freq: Two times a day (BID) | ORAL | 0 refills | Status: AC

## 2017-03-22 MED ORDER — B COMPLEX-C PO TABS: 1.0000 | ORAL_TABLET | Freq: Every day | ORAL | 0 refills | Status: AC

## 2017-03-22 MED ORDER — METHOCARBAMOL 500 MG PO TABS: 500.0000 mg | ORAL_TABLET | Freq: Three times a day (TID) | ORAL | 0 refills | Status: DC | PRN

## 2017-03-22 MED ORDER — SENNOSIDES-DOCUSATE SODIUM 8.6-50 MG PO TABS: 1.0000 | ORAL_TABLET | Freq: Two times a day (BID) | ORAL | 0 refills | Status: DC

## 2017-03-22 MED ORDER — FERROUS SULFATE 325 (65 FE) MG PO TABS: 325.0000 mg | ORAL_TABLET | Freq: Three times a day (TID) | ORAL | 0 refills | Status: DC

## 2017-03-22 NOTE — Progress Notes (Signed)
Patient discharge teaching given, including activity, diet, follow-up appoints, and medications. Patient verbalized understanding of all discharge instructions. IV access was d/c'd. Vitals are stable. Skin is intact except as charted in most recent assessments. Pt to be escorted out by NT, to be driven home by family. 

## 2017-03-22 NOTE — Care Management Note (Signed)
Case Management Note  Patient Details  Name: Trevor Griffin MRN: [id number] Date of Birth: Jul 19, 1955  Subjective/Objective:  61 y.o. M to be discharged home with HHPT provided by [facility]. CM alerted [name], rep for this agency. No other CM needs at this time.                   Action/Plan:CM will sign off for now but will be available should additional discharge needs arise or disposition change.    Expected Discharge Date:  03/19/17               Expected Discharge Plan:  [name]  In-House Referral:     Discharge planning Services  CM Consult  Post Acute Care Choice:  Home Health Choice offered to:     DME Arranged:    DME Agency:     HH Arranged:  PT HH Agency:     Status of Service:  Completed, signed off  If discussed at [place] of Stay Meetings, dates discussed:    Additional Comments:  [name], RN 03/22/2017, 12:07 PM

## 2017-03-22 NOTE — Discharge Instructions (Signed)
Information on my medicine - XARELTO (rivaroxaban)  This medication education was reviewed with me or my healthcare representative as part of my discharge preparation.   WHY WAS XARELTO PRESCRIBED FOR YOU? Xarelto was prescribed to treat your superficial thrombophlebitis.   What do you need to know about Xarelto? The dose is one 10 mg tablet taken ONCE A DAY with your evening meal.  DO NOT stop taking Xarelto without talking to the health care provider who prescribed the medication.  Refill your prescription for 10 mg tablets before you run out.  After discharge, you should have regular check-up appointments with your healthcare provider that is prescribing your Xarelto.  In the future your dose may need to be changed if your kidney function changes by a significant amount.  What do you do if you miss a dose? If you are taking Xarelto ONCE DAILY and you miss a dose, take it as soon as you remember on the same day then continue your regularly scheduled once daily regimen the next day. Do not take two doses of Xarelto at the same time.   Important Safety Information Xarelto is a blood thinner medicine that can cause bleeding. You should call your healthcare provider right away if you experience any of the following: ? Bleeding from an injury or your nose that does not stop. ? Unusual colored urine (red or dark brown) or unusual colored stools (red or black). ? Unusual bruising for unknown reasons. ? A serious fall or if you hit your head (even if there is no bleeding).  Some medicines may interact with Xarelto and might increase your risk of bleeding while on Xarelto. To help avoid this, consult your healthcare provider or pharmacist prior to using any new prescription or non-prescription medications, including herbals, vitamins, non-steroidal anti-inflammatory drugs (NSAIDs) and supplements.  This website has more information on Xarelto: [email or web address]

## 2017-03-23 ENCOUNTER — Telehealth: Payer: Self-pay | Admitting: Hematology and Oncology

## 2017-03-23 NOTE — Discharge Summary (Signed)
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 Please follow-up with Dr. Alvy Bimler in 1 week with a repeat CBC  Follow-up Information    Heath Lark, MD. Schedule an appointment as soon as possible for a visit in 1 week(s).   Specialty:  Hematology and Oncology Why:  repeat CBC Contact information: [place] [address] [place], [place] Follow up.   Specialty:  Home Health Services Why:  HHPT has been ordered by your MD and will be provided by above agency. A rep from the agency will be in contact within 24-48 hours of discharge to arrange.  Contact information: [address] [place] [address] [phone]          Diet recommendation: Cardiac diet  Activity: The patient is advised to gradually reintroduce usual activities.  Discharge Condition: good  Code Status: Full code  History of present illness: As per the H and P dictated on admission, "Trevor Griffin is a 61 y.o. male with Past medical history of CAD S/P PCI, COPD, lung cancer S/P lobectomy, tongue cancer S/P surgery, hypothyroidism, alcohol abuse, GERD, chronic systolic CHF. Patient presented with complaints of left leg pain and swelling. Patient actually identified an area of redness on his  left leg 3 months ago.  He felt he had a bug bite and was given oral Keflex for 7 days. That redness area spread further from his upper thigh involving his lower calf.  Patient was referred to dermatology.  No biopsy was performed but patient was given another prolonged course of antibiotics with Keflex. Patient continues to have this redness involving the left leg and was also referred to vascular surgery and was told that he has SVT. Since last 1 week patient has noted increasing pain involving the left leg both at rest as well as on ambulation. He also noted that the redness is not progressing involving the foot area. He denies any trauma or injury denies any procedures done on the leg.  No nausea no vomiting no fever no chills. He started noticing some cough for last 1 week as well. No chest pain at the time of my evaluation but he does have some tingling-like chest pain on-and-off on the left side resolving on its own. No abdominal pain no diarrhea no constipation. His wife has used Vicks VapoRub x2 in the last 3 months on his leg with improvement in his pain as well as also rubs an ointment on a daily basis.  "  Hospital Course:  Summary of his active problems in the hospital is as following. #Superficial thrombophlebitis  Persistent Covering for cellulitis as well ESR, CRP elevated CK within normal limits, blood culture no growth to date Lower extremity Doppler showed superficial thrombophlebitis, no DVT Ultrasound soft  tissue of lower extremity, negative for ruptured Baker's cyst, or any mass\ CT Lower extremity, non-occlusive thrombus in the great saphenous vein. Could be sup thrombophlebitis vs cellulitis  Vascular surgery on board, likely superficial thrombophlebitis, consider anticoagulation Due to patient's not improving on p.o. Keflex X2 courses, will treat as superficial thrombophlebitis as well as cellulitis Patient was started on fondaparinux, due to lack of insurance  coverage will change him to Xarelto after discussion with vascular surgery. Also changing IV vancomycin to doxycycline.  #Community-acquired pneumonia Improved History of 1 week of cough with shortness of breath CT angiogram showed possible multifocal pneumonia Initially treated with ceftriaxone and azithromycin, changed to doxycycline. Blood culture no growth to date  #CAD Appears euvolemic Continue aspirin, Lipitor Continue to hold Lopressor due to soft blood pressure  #History of GERD Continue PPI  #Mood disorder Continue Wellbutrin  #History of tongue/lung cancer Outpatient follow-up with oncology  Anemia. Patient does appear to have chronic anemia, hemoglobin trending in the range of 8.  At present I do not think that the patient actually has an active bleeding.   Discussed with on-call oncology, negative Coombs test, LDH marginally elevated.  No further workup recommended.   Outpatient follow-up with patient's primary oncologist.  Nausea Without vomiting with constipation. Bowel regimen  All other chronic medical condition were stable during the hospitalization.  Patient was seen by physical therapy, who recommended home health, which was arranged by Education officer, museum and case Freight forwarder. On the day of the discharge the patient's vitals were stable, and no other acute medical condition were reported by patient. the patient was felt safe to be discharge at home with home health.  Procedures and Results:  none   Consultations:  [facility] surgery  DISCHARGE MEDICATION: Discharge Medication List as of 03/22/2017  3:55 PM    START taking these medications   Details  B Complex-C (B-COMPLEX WITH VITAMIN C) tablet Take 1 tablet daily by mouth., Starting Sun 03/22/2017, Normal    ferrous sulfate 325 (65 FE) MG tablet Take 1 tablet (325 mg total) 3 (three) times daily with meals by mouth., Starting Sun 03/22/2017, Until Mon 03/22/2018, Normal      CONTINUE these  medications which have CHANGED   Details  doxycycline (VIBRA-TABS) 100 MG tablet Take 1 tablet (100 mg total) 2 (two) times daily for 3 days by mouth., Starting Sun 03/22/2017, Until Wed 03/25/2017, Normal    guaiFENesin (MUCINEX) 600 MG 12 hr tablet Take 1 tablet (600 mg total) 2 (two) times daily by mouth., Starting Sun 03/22/2017, Normal    HYDROcodone-acetaminophen (NORCO/VICODIN) 5-325 MG tablet Take 1-2 tablets every 4 (four) hours as needed by mouth for moderate pain., Starting Sun 03/22/2017, Normal    methocarbamol (ROBAXIN) 500 MG tablet Take 1 tablet (500 mg total) every 8 (eight) hours as needed by mouth for muscle spasms., Starting Sun 03/22/2017, Normal    polyethylene glycol (MIRALAX / GLYCOLAX) packet Take 17 g daily by mouth., Starting Mon 03/23/2017, Normal    !! rivaroxaban (XARELTO) 10 MG TABS tablet Take 1 tablet (10 mg total) daily by mouth., Starting Tue 04/21/2017, Normal    !! rivaroxaban (XARELTO) 10 MG TABS tablet Take 1 tablet (10 mg total) daily by mouth., Starting Mon 03/23/2017, Normal    senna-docusate (SENOKOT-S) 8.6-50 MG tablet Take 1 tablet 2 (two) times daily by mouth., Starting Sun 03/22/2017, Normal     !! - Potential duplicate medications found. Please discuss with provider.    CONTINUE these medications which have  NOT CHANGED   Details  acetaminophen (TYLENOL) 500 MG tablet Take 1,000 mg at bedtime by mouth. , Historical Med    aspirin 81 MG tablet Take 1 tablet (81 mg total) by mouth daily., Starting Sat 02/25/2014, Normal    atorvastatin (LIPITOR) 80 MG tablet Take 1 tablet (80 mg total) by mouth daily., Starting Sat 02/25/2014, Normal    buPROPion (WELLBUTRIN SR) 150 MG 12 hr tablet Take 1 tablet (150 mg total) by mouth 2 (two) times daily., Starting Sat 02/25/2014, Normal    esomeprazole (NEXIUM) 20 MG capsule Take 20 mg daily at 12 noon by mouth., Historical Med    levothyroxine (SYNTHROID, LEVOTHROID) 100 MCG tablet Take 1 tablet (100 mcg  total) by mouth daily before breakfast., Starting Mon 01/12/2017, Normal    metoprolol tartrate (LOPRESSOR) 25 MG tablet Take 0.5 tablets (12.5 mg total) by mouth 2 (two) times daily., Starting Sat 02/25/2014, Normal    Multiple Vitamins-Minerals (MULTIVITAMIN WITH MINERALS) tablet Take 1 tablet by mouth daily.  , Historical Med    nitroGLYCERIN (NITROSTAT) 0.4 MG SL tablet Place 1 tablet (0.4 mg total) under the tongue every 5 (five) minutes as needed (up to 3 doses). For chest pain, Starting Sat 02/25/2014, Normal       Allergies  Allergen Reactions  . Hydromorphone Nausea Only and Other (See Comments)    Headaches also  . Nubain [Nalbuphine Hcl] Nausea Only and Other (See Comments)    Headaches also    Discharge Exam: Filed Weights   03/20/17 0500 03/21/17 0600 03/22/17 0527  Weight: 99.4 kg (219 lb 1.6 oz) 101.3 kg (223 lb 6.4 oz) 90 kg (198 lb 6.6 oz)   Vitals:   03/22/17 0527 03/22/17 1418  BP: 118/66 110/63  Pulse: 82 88  Resp: 18 18  Temp: (!) 97.4 F (36.3 C)   SpO2: 99% 100%   General: Appear in no distress, no Rash; Oral Mucosa moist. Cardiovascular: S1 and S2 Present, no Murmur, no JVD Respiratory: Bilateral Air entry present and Clear to Auscultation, no Crackles, no wheezes Abdomen: Bowel Sound present, Soft and no tenderness Extremities: left Pedal edema, no calf tenderness Neurology: Grossly no focal neuro deficit.  The results of significant diagnostics from this hospitalization (including imaging, microbiology, ancillary and laboratory) are listed below for reference.    Significant Diagnostic Studies: Ct Angio Chest Pe W And/or Wo Contrast  Result Date: 03/16/2017 CLINICAL DATA:  Shortness of breath. EXAM: CT ANGIOGRAPHY CHEST WITH CONTRAST TECHNIQUE: Multidetector CT imaging of the chest was performed using the standard protocol during bolus administration of intravenous contrast. Multiplanar CT image reconstructions and MIPs were obtained to evaluate  the vascular anatomy. CONTRAST:  68 mL of Isovue 370 intravenously. COMPARISON:  CT scan of October 20, 2016. FINDINGS: Cardiovascular: Satisfactory opacification of the pulmonary arteries to the segmental level. No evidence of pulmonary embolism. Coronary artery calcifications are noted. Atherosclerosis of thoracic aorta is noted without aneurysm formation. No pericardial effusion. Mediastinum/Nodes: No enlarged mediastinal, hilar, or axillary lymph nodes. Thyroid gland, trachea, and esophagus demonstrate no significant findings. Lungs/Pleura: No pneumothorax or pleural effusion is noted. Status post right lobectomy. Multiple rounded densities are noted in the left upper lobe laterally which may represent pneumonia, but neoplasm cannot be excluded. The largest measures 11 mm. Similar abnormalities are noted in the right lower lobe. Upper Abdomen: No acute abnormality. Musculoskeletal: No chest wall abnormality. No acute or significant osseous findings. Review of the MIP images confirms the above findings. IMPRESSION: No definite  evidence of pulmonary embolus. Coronary artery calcifications are noted suggesting coronary artery disease. Status post right lobectomy. Multiple nodular densities are noted in the left upper lobe and right lower lobe with the largest measuring 11 mm. This may simply represent multifocal pneumonia, but metastatic disease cannot be excluded. Follow-up CT scan in 2-3 weeks is recommended to ensure resolution or stability. Aortic Atherosclerosis (ICD10-I70.0). Electronically Signed   By: Marijo Conception, M.D.   On: 03/16/2017 16:15   [place] Lt Lower Extrem Ltd Soft Tissue Non Vascular  Result Date: 03/16/2017 CLINICAL DATA:  Evaluate for Baker cysts. Posterior left knee soft tissue swelling. EXAM: ULTRASOUND LEFT LOWER EXTREMITY LIMITED TECHNIQUE: Ultrasound examination of the lower extremity soft tissues was performed in the area of clinical concern. COMPARISON:  None. FINDINGS: No soft tissue  mass. No cyst. Specifically, no evidence of a Baker's cyst. There is nonspecific soft tissue edema. IMPRESSION: 1. No mass or popliteal cyst. Electronically Signed   By: Lajean Manes M.D.   On: 03/16/2017 19:35   Ct Extremity Lower Left W Contrast  Result Date: 03/19/2017 CLINICAL DATA:  Left lower leg swelling and redness. Superficial thrombophlebitis diagnosed on prior left lower extremity ultrasound in September 2018. EXAM: CT OF THE LOWER LEFT EXTREMITY WITH CONTRAST TECHNIQUE: Multidetector CT imaging of the lower left extremity was performed according to the standard protocol following intravenous contrast administration. COMPARISON:  Left lower extremity DVT ultrasound dated January 21, 2017. CONTRAST:  131m ISOVUE-300 IOPAMIDOL (ISOVUE-300) INJECTION 61% FINDINGS: Bones/Joint/Cartilage No acute fracture or malalignment. Joint spaces are preserved. No knee or ankle joint effusion. Ligaments Suboptimally assessed by CT. Muscles and Tendons No focal abnormality.  Visualized tendons are intact. Soft tissues Diffuse, circumferential soft tissue swelling about the lower leg. No drainable fluid collection. Atherosclerotic vascular calcifications. The great saphenous vein appears occluded at the level of the ankle. Nonocclusive thrombus within the great saphenous vein below the knee. The great saphenous vein above the knee appears patent. IMPRESSION: 1. Diffuse, circumferential soft tissue swelling about the lower leg, nonspecific, but can be seen with cellulitis or superficial thrombophlebitis. No drainable fluid collection. 2. The great saphenous vein is patent above the knee, but contains nonocclusive thrombus below the knee, and appears occluded at the level of the ankle. Electronically Signed   By: WTitus DubinM.D.   On: 03/19/2017 12:56    Microbiology: Recent Results (from the past 240 hour(s))  Culture, blood (routine x 2)     Status: None   Collection Time: 03/16/17  5:39 PM  Result  Value Ref Range Status   Specimen Description BLOOD RIGHT HAND  Final   Special Requests   Final    BOTTLES DRAWN AEROBIC AND ANAEROBIC Blood Culture adequate volume   Culture NO GROWTH 5 DAYS  Final   Report Status 03/21/2017 FINAL  Final  Culture, blood (routine x 2)     Status: None   Collection Time: 03/16/17  5:40 PM  Result Value Ref Range Status   Specimen Description BLOOD RIGHT ANTECUBITAL  Final   Special Requests   Final    BOTTLES DRAWN AEROBIC AND ANAEROBIC Blood Culture adequate volume   Culture NO GROWTH 5 DAYS  Final   Report Status 03/21/2017 FINAL  Final     Labs: CBC: Recent Labs  Lab 03/18/17 0654 03/19/17 0436 03/20/17 0308 03/20/17 0908 03/21/17 0539 03/21/17 0925 03/22/17 0235  WBC 4.7 3.6* 3.4* 3.9* 4.0 4.8 3.9*  NEUTROABS 3.9 2.8 2.6  --   --   --   --  HGB 8.0* 8.1* 7.6* 8.2* 7.5* 8.0* 7.0*  HCT 26.4* 26.9* 25.2* 27.0* 24.8* 26.9* 23.2*  MCV 83.8 83.8 84.0 84.6 84.1 84.9 84.4  PLT 188 194 195 211 186 230 161   Basic Metabolic Panel: Recent Labs  Lab 03/18/17 0654 03/19/17 0436 03/20/17 0308 03/21/17 0539 03/22/17 0235  NA 129* 130* 129* 128* 130*  K 4.1 3.8 4.1 4.3 4.2  CL 97* 99* 97* 97* 97*  CO2 24 25 24 25 26   GLUCOSE 134* 93 90 95 94  BUN 6 5* 5* 5* 7  CREATININE 0.99 0.89 0.94 0.82 0.90  CALCIUM 8.5* 8.2* 8.2* 8.2* 8.2*   Liver Function Tests: Recent Labs  Lab 03/17/17 0509  AST 21  ALT 11*  ALKPHOS 66  BILITOT 1.2  PROT 6.0*  ALBUMIN 2.8*   No results for input(s): LIPASE, AMYLASE in the last 168 hours. No results for input(s): AMMONIA in the last 168 hours. Cardiac Enzymes: Recent Labs  Lab 03/16/17 1739 03/16/17 2339 03/17/17 0509  CKTOTAL 115  --   --   TROPONINI <0.03 <0.03 <0.03   BNP (last 3 results) No results for input(s): BNP in the last 8760 hours. CBG: Recent Labs  Lab 03/17/17 0749  GLUCAP 130*   Time spent: 35 minutes  Signed:  Berle Mull  [facility] 03/22/2017 , 4:40 PM

## 2017-03-23 NOTE — Telephone Encounter (Signed)
Called patient regarding current schedule °

## 2017-03-28 ENCOUNTER — Telehealth: Payer: Self-pay | Admitting: Family Medicine

## 2017-03-28 ENCOUNTER — Telehealth: Payer: Self-pay | Admitting: Physician Assistant

## 2017-03-28 NOTE — Telephone Encounter (Signed)
Received call from [facility] CM about Xarelto 10 mg dose being not covered by card given at discharge.  Chart reviewed, reviewed [facility] and d/w Dr. Posey Pronto. Xarelto 10 mg is the recommended dose for an oral anticoagulant. No other oral anticoagulants studied. This process has been present since at least 01/2017 by imaging in system.  Patient has not been on tx since d/c and this is a superficial clot. D/w wife who has made appt to see cardiology team 11/26. Patient also seen by Dr. Alvy Bimler. Rec if cardiology unable to help, check with Dr. Alvy Bimler, especially given anemia. Wife understands.   Murray Hodgkins, MD [facility]

## 2017-03-28 NOTE — Telephone Encounter (Deleted)
Received call from [facility] CM about Xarelto 10 mg dose being not covered by card given at discharge.  Chart reviewed, reviewed [facility] and d/w Dr. Posey Pronto. Xarelto 10 mg is the recommended dose for an oral anticoagulant. No other oral anticoagulants studied. Patient has not been on tx since d/c and this is a superficial clot. D/w wife who has made appt to see cardiology team 11/26. Patient also seen by Dr. Alvy Bimler. Rec if cardiology unable to help, check with Dr. Alvy Bimler, especially given anemia. Wife understands.   Murray Hodgkins, MD [facility]

## 2017-03-28 NOTE — Telephone Encounter (Signed)
Paged by patient's wife to see if I can prescribe some 20mg  daily Xarelto. Reviewed his recent admission, looks like he came to hospital with LE cellulitis vs thrombophlibitis. Venous doppler negative for DVT, but CT of lower extremity should partial thrombosis. Felt this is related to cellulitis or thrombophlibitis. Therefore he was discharged on 10mg  daily of Xarelto for praphylaxis as recommended by pharmacist. His hgb was between 7-8 during recent admission.   He was given coupon card, however per wife coupon only worked with 15mg  or 20mg  xarelto, but not 10mg  daily. I have discussed with our clinical pharmacist, Xarelto 20mg  cannot be cut into 10mg . I am hesitant to prescribe 20mg  dose given his severe anemia. Technically if blood clot is suspected, the dose should be 15mg  BID for 21 days before transitioning to 20mg  daily. Early followup required, she will call either PCP, cardiology or hematology on Monday to arrange visit.   Very complicated case, if see cardiology PA/NP, then vist must be on a day Dr. Ellyn Hack is also present.   Hilbert Corrigan PA Pager: [phone]

## 2017-03-28 NOTE — Care Management (Signed)
CM informed by 5w floor nurse that pts wife called and informed staff that she was unable to get Xarelto 10mg  filled - pt was discharged on 03/22/17 .  CM contacted wife- per the free 30 day card only scripts for 15 mg and 20 mg are covered under benefit - pt was discharged on 10mg  .  CM was told by wife that pt does not have PCP - declined information of how to locate PCP in area that accepts insurance.  CM contacted Xarelto rep and barrier was confirmed.  CM spoke with Dr Sarajane Jews of the Hospitalist team (discharging doctor was Posey Pronto) and  communicated; pt has been without anticoagulation since discharge, free 30 day benefit will not work with current prescribed dose.pt not able to afford cost of Xarelto,    MD agreed to  contact pt/wife post reviewing chart.

## 2017-03-30 ENCOUNTER — Emergency Department (HOSPITAL_COMMUNITY): Payer: Medicare Other

## 2017-03-30 ENCOUNTER — Encounter (HOSPITAL_COMMUNITY): Payer: Self-pay

## 2017-03-30 ENCOUNTER — Inpatient Hospital Stay (HOSPITAL_COMMUNITY)
Admission: EM | Admit: 2017-03-30 | Discharge: 2017-04-04 | DRG: 177 | Disposition: A | Discharge status: Home Health | Payer: Medicare Other | Attending: Family Medicine | Admitting: Family Medicine

## 2017-03-30 ENCOUNTER — Telehealth: Payer: Self-pay | Admitting: Cardiology

## 2017-03-30 ENCOUNTER — Emergency Department (HOSPITAL_COMMUNITY)
Admit: 2017-03-30 | Discharge: 2017-03-30 | Disposition: A | Discharge status: Home / Self Care | Payer: Medicare Other | Attending: Emergency Medicine | Admitting: Emergency Medicine

## 2017-03-30 DIAGNOSIS — J690 Pneumonitis due to inhalation of food and vomit: Secondary | ICD-10-CM | POA: Diagnosis not present

## 2017-03-30 DIAGNOSIS — R0600 Dyspnea, unspecified: Secondary | ICD-10-CM | POA: Diagnosis not present

## 2017-03-30 DIAGNOSIS — Z85810 Personal history of malignant neoplasm of tongue: Secondary | ICD-10-CM | POA: Diagnosis not present

## 2017-03-30 DIAGNOSIS — E039 Hypothyroidism, unspecified: Secondary | ICD-10-CM | POA: Diagnosis present

## 2017-03-30 DIAGNOSIS — J181 Lobar pneumonia, unspecified organism: Secondary | ICD-10-CM | POA: Diagnosis not present

## 2017-03-30 DIAGNOSIS — J449 Chronic obstructive pulmonary disease, unspecified: Secondary | ICD-10-CM | POA: Diagnosis present

## 2017-03-30 DIAGNOSIS — Z79899 Other long term (current) drug therapy: Secondary | ICD-10-CM | POA: Diagnosis not present

## 2017-03-30 DIAGNOSIS — D509 Iron deficiency anemia, unspecified: Secondary | ICD-10-CM | POA: Diagnosis present

## 2017-03-30 DIAGNOSIS — E785 Hyperlipidemia, unspecified: Secondary | ICD-10-CM | POA: Diagnosis present

## 2017-03-30 DIAGNOSIS — Z7982 Long term (current) use of aspirin: Secondary | ICD-10-CM

## 2017-03-30 DIAGNOSIS — Z85118 Personal history of other malignant neoplasm of bronchus and lung: Secondary | ICD-10-CM

## 2017-03-30 DIAGNOSIS — M60862 Other myositis, left lower leg: Secondary | ICD-10-CM | POA: Diagnosis not present

## 2017-03-30 DIAGNOSIS — Z7901 Long term (current) use of anticoagulants: Secondary | ICD-10-CM | POA: Diagnosis not present

## 2017-03-30 DIAGNOSIS — Z79890 Hormone replacement therapy (postmenopausal): Secondary | ICD-10-CM | POA: Diagnosis not present

## 2017-03-30 DIAGNOSIS — I5032 Chronic diastolic (congestive) heart failure: Secondary | ICD-10-CM | POA: Diagnosis present

## 2017-03-30 DIAGNOSIS — F17290 Nicotine dependence, other tobacco product, uncomplicated: Secondary | ICD-10-CM | POA: Diagnosis present

## 2017-03-30 DIAGNOSIS — R1312 Dysphagia, oropharyngeal phase: Secondary | ICD-10-CM | POA: Diagnosis present

## 2017-03-30 DIAGNOSIS — R0602 Shortness of breath: Secondary | ICD-10-CM | POA: Diagnosis not present

## 2017-03-30 DIAGNOSIS — R2241 Localized swelling, mass and lump, right lower limb: Secondary | ICD-10-CM | POA: Diagnosis not present

## 2017-03-30 DIAGNOSIS — Z923 Personal history of irradiation: Secondary | ICD-10-CM | POA: Diagnosis not present

## 2017-03-30 DIAGNOSIS — Z902 Acquired absence of lung [part of]: Secondary | ICD-10-CM | POA: Diagnosis not present

## 2017-03-30 DIAGNOSIS — I110 Hypertensive heart disease with heart failure: Secondary | ICD-10-CM | POA: Diagnosis present

## 2017-03-30 DIAGNOSIS — I252 Old myocardial infarction: Secondary | ICD-10-CM | POA: Diagnosis not present

## 2017-03-30 DIAGNOSIS — M79609 Pain in unspecified limb: Secondary | ICD-10-CM

## 2017-03-30 DIAGNOSIS — K219 Gastro-esophageal reflux disease without esophagitis: Secondary | ICD-10-CM | POA: Diagnosis present

## 2017-03-30 DIAGNOSIS — I809 Phlebitis and thrombophlebitis of unspecified site: Secondary | ICD-10-CM | POA: Diagnosis not present

## 2017-03-30 DIAGNOSIS — M7989 Other specified soft tissue disorders: Secondary | ICD-10-CM | POA: Diagnosis not present

## 2017-03-30 DIAGNOSIS — Z8249 Family history of ischemic heart disease and other diseases of the circulatory system: Secondary | ICD-10-CM | POA: Diagnosis not present

## 2017-03-30 DIAGNOSIS — M609 Myositis, unspecified: Secondary | ICD-10-CM | POA: Diagnosis present

## 2017-03-30 DIAGNOSIS — I255 Ischemic cardiomyopathy: Secondary | ICD-10-CM | POA: Diagnosis present

## 2017-03-30 DIAGNOSIS — C023 Malignant neoplasm of anterior two-thirds of tongue, part unspecified: Secondary | ICD-10-CM | POA: Diagnosis not present

## 2017-03-30 DIAGNOSIS — M79662 Pain in left lower leg: Secondary | ICD-10-CM | POA: Diagnosis not present

## 2017-03-30 DIAGNOSIS — Z9861 Coronary angioplasty status: Secondary | ICD-10-CM | POA: Diagnosis not present

## 2017-03-30 DIAGNOSIS — R600 Localized edema: Secondary | ICD-10-CM | POA: Diagnosis not present

## 2017-03-30 DIAGNOSIS — J9601 Acute respiratory failure with hypoxia: Secondary | ICD-10-CM | POA: Diagnosis present

## 2017-03-30 DIAGNOSIS — D649 Anemia, unspecified: Secondary | ICD-10-CM | POA: Diagnosis not present

## 2017-03-30 DIAGNOSIS — M79605 Pain in left leg: Secondary | ICD-10-CM | POA: Diagnosis not present

## 2017-03-30 DIAGNOSIS — I2510 Atherosclerotic heart disease of native coronary artery without angina pectoris: Secondary | ICD-10-CM | POA: Diagnosis present

## 2017-03-30 DIAGNOSIS — R52 Pain, unspecified: Secondary | ICD-10-CM | POA: Diagnosis not present

## 2017-03-30 LAB — CK: CK TOTAL: 62 U/L (ref 49–397)

## 2017-03-30 LAB — BASIC METABOLIC PANEL
ANION GAP: 9 (ref 5–15)
BUN: 9 mg/dL (ref 6–20)
CALCIUM: 8.5 mg/dL — AB (ref 8.9–10.3)
CO2: 21 mmol/L — AB (ref 22–32)
Chloride: 98 mmol/L — ABNORMAL LOW (ref 101–111)
Creatinine, Ser: 1.01 mg/dL (ref 0.61–1.24)
GFR calc Af Amer: >60 mL/min (ref >60)
GFR calc non Af Amer: >60 mL/min (ref >60)
GLUCOSE: 96 mg/dL (ref 65–99)
Potassium: 4 mmol/L (ref 3.5–5.1)
Sodium: 128 mmol/L — ABNORMAL LOW (ref 135–145)

## 2017-03-30 LAB — I-STAT CG4 LACTIC ACID, ED
LACTIC ACID, VENOUS: 1.43 mmol/L (ref 0.5–1.9)
Lactic Acid, Venous: 2.4 mmol/L (ref 0.5–1.9)
Lactic Acid, Venous: 1.49 mmol/L (ref 0.5–1.9)

## 2017-03-30 LAB — I-STAT TROPONIN, ED: TROPONIN I, POC: 0 ng/mL (ref 0.00–0.08)

## 2017-03-30 LAB — CBC
HEMATOCRIT: 21.1 % — AB (ref 39.0–52.0)
HEMOGLOBIN: 6.4 g/dL — AB (ref 13.0–17.0)
MCH: 27 pg (ref 26.0–34.0)
MCHC: 30.3 g/dL (ref 30.0–36.0)
MCV: 89 fL (ref 78.0–100.0)
Platelets: 300 10*3/uL (ref 150–400)
RBC: 2.37 MIL/uL — ABNORMAL LOW (ref 4.22–5.81)
RDW: 22.5 % — AB (ref 11.5–15.5)
WBC: 6.8 10*3/uL (ref 4.0–10.5)

## 2017-03-30 LAB — IRON AND TIBC
IRON: 36 ug/dL — AB (ref 45–182)
Saturation Ratios: 12 % — ABNORMAL LOW (ref 17.9–39.5)
TIBC: 300 ug/dL (ref 250–450)
UIBC: 264 ug/dL

## 2017-03-30 LAB — FERRITIN: Ferritin: 90 ng/mL (ref 24–336)

## 2017-03-30 LAB — C-REACTIVE PROTEIN: CRP: 10.3 mg/dL — ABNORMAL HIGH (ref <1.0)

## 2017-03-30 LAB — SEDIMENTATION RATE: Sed Rate: 70 mm/hr — ABNORMAL HIGH (ref 0–16)

## 2017-03-30 LAB — PREPARE RBC (CROSSMATCH)

## 2017-03-30 LAB — RETICULOCYTES
RBC.: 2.08 MIL/uL — AB (ref 4.22–5.81)
RETIC COUNT ABSOLUTE: 193.4 10*3/uL — AB (ref 19.0–186.0)
RETIC CT PCT: 9.3 % — AB (ref 0.4–3.1)

## 2017-03-30 LAB — POC OCCULT BLOOD, ED: FECAL OCCULT BLD: NEGATIVE

## 2017-03-30 LAB — FOLATE: Folate: 7.8 ng/mL (ref >5.9)

## 2017-03-30 LAB — VITAMIN B12: VITAMIN B 12: 505 pg/mL (ref 180–914)

## 2017-03-30 MED ORDER — GUAIFENESIN ER 600 MG PO TB12
600.0000 mg | ORAL_TABLET | Freq: Two times a day (BID) | ORAL | Status: DC
  Administered 2017-03-31 – 2017-04-04 (×10): 600 mg via ORAL
  Filled 2017-03-30 (×10): qty 1

## 2017-03-30 MED ORDER — POLYETHYLENE GLYCOL 3350 17 G PO PACK
17.0000 g | PACK | Freq: Every day | ORAL | Status: DC
  Administered 2017-04-01: 17 g via ORAL
  Filled 2017-03-30 (×4): qty 1

## 2017-03-30 MED ORDER — LEVOTHYROXINE SODIUM 100 MCG PO TABS
100.0000 ug | ORAL_TABLET | Freq: Every day | ORAL | Status: DC
  Administered 2017-03-31 – 2017-04-04 (×5): 100 ug via ORAL
  Filled 2017-03-30 (×6): qty 1

## 2017-03-30 MED ORDER — SODIUM CHLORIDE 0.9 % IV SOLN
1500.0000 mg | Freq: Once | INTRAVENOUS | Status: AC
  Administered 2017-03-30: 1500 mg via INTRAVENOUS
  Filled 2017-03-30: qty 1500

## 2017-03-30 MED ORDER — SODIUM CHLORIDE 0.9 % IV BOLUS (SEPSIS)
1000.0000 mL | Freq: Once | INTRAVENOUS | Status: AC
  Administered 2017-03-30: 1000 mL via INTRAVENOUS

## 2017-03-30 MED ORDER — PIPERACILLIN-TAZOBACTAM 3.375 G IVPB 30 MIN
3.3750 g | Freq: Once | INTRAVENOUS | Status: AC
  Administered 2017-03-30: 3.375 g via INTRAVENOUS
  Filled 2017-03-30: qty 50

## 2017-03-30 MED ORDER — PANTOPRAZOLE SODIUM 40 MG PO TBEC
40.0000 mg | DELAYED_RELEASE_TABLET | Freq: Every day | ORAL | Status: DC
  Administered 2017-03-31 – 2017-04-04 (×5): 40 mg via ORAL
  Filled 2017-03-30 (×5): qty 1

## 2017-03-30 MED ORDER — ATORVASTATIN CALCIUM 80 MG PO TABS
80.0000 mg | ORAL_TABLET | Freq: Every day | ORAL | Status: DC
  Administered 2017-03-31 – 2017-04-04 (×5): 80 mg via ORAL
  Filled 2017-03-30 (×5): qty 1

## 2017-03-30 MED ORDER — BUPROPION HCL ER (SR) 150 MG PO TB12
150.0000 mg | ORAL_TABLET | Freq: Two times a day (BID) | ORAL | Status: DC
  Administered 2017-03-31 – 2017-04-04 (×9): 150 mg via ORAL
  Filled 2017-03-30 (×10): qty 1

## 2017-03-30 MED ORDER — ASPIRIN EC 81 MG PO TBEC
162.0000 mg | DELAYED_RELEASE_TABLET | Freq: Two times a day (BID) | ORAL | Status: DC
  Administered 2017-03-31 (×2): 162 mg via ORAL
  Filled 2017-03-30 (×3): qty 2

## 2017-03-30 MED ORDER — SODIUM CHLORIDE 0.9 % IV SOLN
Freq: Once | INTRAVENOUS | Status: AC
  Administered 2017-03-31: 250 mL via INTRAVENOUS

## 2017-03-30 MED ORDER — ADULT MULTIVITAMIN W/MINERALS CH
1.0000 | ORAL_TABLET | Freq: Every day | ORAL | Status: DC
  Administered 2017-03-31 – 2017-04-04 (×5): 1 via ORAL
  Filled 2017-03-30 (×5): qty 1

## 2017-03-30 MED ORDER — VANCOMYCIN HCL IN DEXTROSE 1-5 GM/200ML-% IV SOLN
1000.0000 mg | Freq: Two times a day (BID) | INTRAVENOUS | Status: DC
  Administered 2017-03-31: 1000 mg via INTRAVENOUS
  Filled 2017-03-30 (×2): qty 200

## 2017-03-30 MED ORDER — DEXTROSE 5 % IV SOLN: 500.0000 mg | Freq: Every day | INTRAVENOUS | Status: DC

## 2017-03-30 MED ORDER — METOPROLOL TARTRATE 12.5 MG HALF TABLET
12.5000 mg | ORAL_TABLET | Freq: Two times a day (BID) | ORAL | Status: DC
  Administered 2017-03-31 – 2017-04-04 (×9): 12.5 mg via ORAL
  Filled 2017-03-30 (×10): qty 1

## 2017-03-30 MED ORDER — ENOXAPARIN SODIUM 40 MG/0.4ML ~~LOC~~ SOLN
40.0000 mg | Freq: Every day | SUBCUTANEOUS | Status: DC
  Administered 2017-03-31 – 2017-04-04 (×5): 40 mg via SUBCUTANEOUS
  Filled 2017-03-30 (×5): qty 0.4

## 2017-03-30 MED ORDER — IOPAMIDOL (ISOVUE-370) INJECTION 76%
INTRAVENOUS | Status: AC
  Administered 2017-03-30: 100 mL via INTRAVENOUS
  Filled 2017-03-30: qty 100

## 2017-03-30 MED ORDER — CEFTRIAXONE SODIUM 1 G IJ SOLR: 1.0000 g | Freq: Every day | INTRAMUSCULAR | Status: DC

## 2017-03-30 MED ORDER — SENNOSIDES-DOCUSATE SODIUM 8.6-50 MG PO TABS
1.0000 | ORAL_TABLET | Freq: Two times a day (BID) | ORAL | Status: DC
  Administered 2017-03-31 – 2017-04-03 (×6): 1 via ORAL
  Filled 2017-03-30 (×8): qty 1

## 2017-03-30 MED ORDER — ACETAMINOPHEN 500 MG PO TABS
1000.0000 mg | ORAL_TABLET | Freq: Every day | ORAL | Status: DC
  Administered 2017-03-31 – 2017-04-03 (×5): 1000 mg via ORAL
  Filled 2017-03-30 (×5): qty 2

## 2017-03-30 MED ORDER — ONDANSETRON HCL 4 MG/2ML IJ SOLN
4.0000 mg | Freq: Once | INTRAMUSCULAR | Status: AC
  Administered 2017-03-30: 4 mg via INTRAVENOUS
  Filled 2017-03-30: qty 2

## 2017-03-30 MED ORDER — FERROUS SULFATE 325 (65 FE) MG PO TABS
325.0000 mg | ORAL_TABLET | Freq: Three times a day (TID) | ORAL | Status: DC
  Administered 2017-03-31 – 2017-04-04 (×12): 325 mg via ORAL
  Filled 2017-03-30 (×11): qty 1

## 2017-03-30 MED ORDER — MORPHINE SULFATE (PF) 4 MG/ML IV SOLN
4.0000 mg | INTRAVENOUS | Status: AC | PRN
  Administered 2017-03-30 – 2017-03-31 (×3): 4 mg via INTRAVENOUS
  Filled 2017-03-30 (×3): qty 1

## 2017-03-30 MED ORDER — DEXTROSE 5 % IV SOLN
1.0000 g | Freq: Three times a day (TID) | INTRAVENOUS | Status: DC
  Administered 2017-03-31 (×2): 1 g via INTRAVENOUS
  Filled 2017-03-30 (×3): qty 1

## 2017-03-30 MED ORDER — HYDROCODONE-ACETAMINOPHEN 5-325 MG PO TABS
1.0000 | ORAL_TABLET | ORAL | Status: DC | PRN
  Filled 2017-03-30: qty 2

## 2017-03-30 MED ORDER — VANCOMYCIN HCL IN DEXTROSE 1-5 GM/200ML-% IV SOLN: 1000.0000 mg | Freq: Two times a day (BID) | INTRAVENOUS | Status: DC

## 2017-03-30 MED ORDER — METHOCARBAMOL 500 MG PO TABS
500.0000 mg | ORAL_TABLET | Freq: Three times a day (TID) | ORAL | Status: DC | PRN
  Administered 2017-03-31 – 2017-04-03 (×5): 500 mg via ORAL
  Filled 2017-03-30 (×5): qty 1

## 2017-03-30 NOTE — H&P (Signed)
History and Physical    Trevor Griffin XKG:[id number] DOB: Jul 10, 1955 DOA: 03/30/2017  PCP: System, Pcp Not In  Patient coming from: Home  I have personally briefly reviewed patient's old medical records in [place]  Chief Complaint: SOB  HPI: Trevor Griffin is a 61 y.o. 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  Concern for RML, RUL spread and lymphnode enlargement.  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  Patient has been having trouble with L leg pain and swelling for the past 3 months.  Was actually admitted for this on 11/12 - 11/18 to our service.  Work up at that time showed only superficial thrombophlebitis, no DVT on [place].  He was covered for cellulitis.  Started on Xarelto but due to anemia didn't end up really starting this and just wound up on 325 ASA daily (divided in 2 doses).  Cards was hesitant to prescribe this given his severe anemia (HGB 7 already this month).  He presents to the ED with continued leg pain and now has progressive SOB, severe DOE.  Cant walk to doorway without getting SOB.   ED Course: HGB 6.4, a repeat US of LLE is again neg for DVT, CT of LLE also neg for DVT.  CTA chest is neg for PE; however, does show a worsening "inflamatory process" with ground glass opacities throughout the R lung, this compared to CT just earlier this month.  Persistent lymph node enlargement and pulm nodules.   Review of Systems: As per HPI otherwise 10 point review of systems negative.   Past Medical History:  Diagnosis Date  . Acute ST elevation myocardial infarction (STEMI) involving left anterior descending (LAD) coronary artery ([place]) 2008 and 2009   5/'08 - DES x 4 to p-m LAD; 2/'09 - PTCA of  in-stent thrombosis  . Alcohol abuse   . Anxiety   . CAD S/P percutaneous  coronary angioplasty 2008   a. Ant STEMI 5/08 w/ 4 Taxus DES p-m LAD (3.0 x 16,16,24,28 mm) ;  b. 2/'09:  Stopped Plavix-> Ant STEMI/stent thrombosis in LAD (PTCA - 3.8 mm), LCX 72m/d;  c. 10/12: Cath/PCI: p-m LAD stents 10-20% ISR, mLAD 40%, CFX 30-40%, mCFX 90% (Promus Premier DES 3.0 mm x 20 mm - 3.54mm ), EF 35%; d. cath 2/17/'16 no Sig CAD, patent LAD & Cx stents, EF 40-45%  . Chronic pain   . COPD (chronic obstructive pulmonary disease) ([place])   . Depression   . Fatigue    Sleep study 6/08 was negative. Sleep study 5/13 showed no OSA.  Marland Kitchen GERD (gastroesophageal reflux disease)   . Headache(784.0)    migraines  . Hx of radiation therapy 10/25/12-12/10/12    Right Tongue/ Bilateral Neck //Total Dose, 60 Gy in 30 Fractions  . Hyperlipidemia   . Hypertension   . Ischemic cardiomyopathy    a. Echo 08/20/11 with EF 50-55% (previously 35%);  b. 02/2014 Echo: EF 45-50-%, mid-apicalanteroseptal AK, Gr 1 dd, mild AI, midlly dil LA.  . Lung cancer ([place]) dx'd 2015  . Obesity   . Tobacco abuse   . 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 COLONOSCOPY N/A 11/25/2016   Procedure: COLONOSCOPY;  Surgeon: Clarene Essex,  MD;  Location: [facility];  Service: Endoscopy;  Laterality: N/A;  . CORONARY ANGIOPLASTY WITH STENT PLACEMENT  2008-2012   a. 5/'08: Anterior STEMI: Proximal-mid LAD PCI: 4 total Taxus DES stents 3.0 mm x  16 mm, 16 mm, 24 mm and 28 mm.( post-dil 3.5 mm);; b. 2/'09: PTCA LAD in-stent thrombosis - 3.8 mm;; c. 10/'12: mCx Promus Element DES 3.0 x 20 mm- post-dil - 3.25 mm. -> patent in 2016  . ESOPHAGOGASTRODUODENOSCOPY (EGD) WITH PROPOFOL N/A 11/25/2016   Procedure: ESOPHAGOGASTRODUODENOSCOPY (EGD) WITH PROPOFOL;  Surgeon: Clarene Essex, MD;  Location: [facility];  Service: Endoscopy;  Laterality: N/A;  neonatal scope  . FRACTURE SURGERY Right    as a child,leg  . LEFT HEART CATH AND CORONARY ANGIOGRAPHY  2006-2012    a) 2006: minimal CAD, normal EF;; b) 5/'08 - Ant STEMI - 100% p-mLAD (DES x 4 in LAD);; c)2/'09: Ant STEMI for IST/thrombosis of LAD ->PTCA, 80% mCx;; 10/'12: mCx 80-90% - DES PCI, patent LAD stents, EF ~35% - anterolateral AK, apical severe HK.  Marland Kitchen LEFT HEART CATHETERIZATION WITH CORONARY ANGIOGRAM N/A 06/21/2014   Procedure: LEFT HEART CATHETERIZATION WITH CORONARY ANGIOGRAM;  Surgeon: Burnell Blanks, MD;  Location: [facility] CATH LAB: No evidence of obstructive disease.  Patent LAD and circumflex stents.  EF 40-45%.  . LOBECTOMY Right 06/06/2013   Procedure: LOBECTOMY;  Surgeon: Melrose Nakayama, MD;  Location: [place];  Service: Thoracic;  Laterality: Right;  . TRANSTHORACIC ECHOCARDIOGRAM  02/2014   EF 45-50%.  Akinesis of the mid apical anteroseptal wall.  GR 1 DD.  Mild MR.  . VIDEO ASSISTED THORACOSCOPY (VATS)/WEDGE RESECTION Right 06/06/2013   Procedure: VIDEO ASSISTED THORACOSCOPY (VATS)/WEDGE RESECTION;  Surgeon: Melrose Nakayama, MD;  Location: [place];  Service: Thoracic;  Laterality: Right;     reports that he has been smoking cigars.  He has a 20.00 pack-year smoking history. he has never used smokeless tobacco. He reports that he drinks alcohol. He reports that he does not use drugs.  Allergies  Allergen Reactions  . Hydromorphone Nausea Only and Other (See Comments)    Headaches also  . Nubain [Nalbuphine Hcl] Nausea Only and Other (See Comments)    Headaches also    Family History  Problem Relation Age of Onset  . Kidney disease Mother        CHRONIC  . Cancer Sister   . Liver disease Sister        LIVER FAILURE  . Heart attack Brother      Prior to Admission medications   Medication Sig Start Date End Date Taking? Authorizing Provider  acetaminophen (TYLENOL) 500 MG tablet Take 1,000 mg at bedtime by mouth.    Yes [provider]  aspirin 81 MG tablet Take 1 tablet (81 mg total) by mouth daily. Patient taking differently: Take 162 mg 2 (two) times daily  by mouth.  02/25/14  Yes Rogelia Mire, NP  atorvastatin (LIPITOR) 80 MG tablet Take 1 tablet (80 mg total) by mouth daily. 02/25/14  Yes Rogelia Mire, NP  B Complex-C (B-COMPLEX WITH VITAMIN C) tablet Take 1 tablet daily by mouth. 03/22/17  Yes Lavina Hamman, MD  buPROPion [facility] SR) 150 MG 12 hr tablet Take 1 tablet (150 mg total) by mouth 2 (two) times daily. 02/25/14  Yes Rogelia Mire, NP  esomeprazole (NEXIUM) 20 MG capsule Take 20 mg by mouth daily.    Yes [provider]  ferrous sulfate 325 (65 FE)  MG tablet Take 1 tablet (325 mg total) 3 (three) times daily with meals by mouth. 03/22/17 03/22/18 Yes Lavina Hamman, MD  guaiFENesin (MUCINEX) 600 MG 12 hr tablet Take 1 tablet (600 mg total) 2 (two) times daily by mouth. 03/22/17  Yes Lavina Hamman, MD  HYDROcodone-acetaminophen (NORCO/VICODIN) 5-325 MG tablet Take 1-2 tablets every 4 (four) hours as needed by mouth for moderate pain. 03/22/17  Yes Lavina Hamman, MD  levothyroxine (SYNTHROID, LEVOTHROID) 100 MCG tablet Take 1 tablet (100 mcg total) by mouth daily before breakfast. 01/12/17  Yes Gorsuch, Ni, MD  methocarbamol (ROBAXIN) 500 MG tablet Take 1 tablet (500 mg total) every 8 (eight) hours as needed by mouth for muscle spasms. 03/22/17  Yes Lavina Hamman, MD  metoprolol tartrate (LOPRESSOR) 25 MG tablet Take 0.5 tablets (12.5 mg total) by mouth 2 (two) times daily. 02/25/14  Yes Rogelia Mire, NP  Multiple Vitamins-Minerals (MULTIVITAMIN WITH MINERALS) tablet Take 1 tablet by mouth daily.     Yes [provider]  nitroGLYCERIN (NITROSTAT) 0.4 MG SL tablet Place 1 tablet (0.4 mg total) under the tongue every 5 (five) minutes as needed (up to 3 doses). For chest pain Patient taking differently: Place 0.4 mg under the tongue every 5 (five) minutes as needed for chest pain (up to 3 doses).  02/25/14  Yes Rogelia Mire, NP  polyethylene glycol [facility] / GLYCOLAX) packet Take  17 g daily by mouth. 03/23/17  Yes Lavina Hamman, MD  senna-docusate (SENOKOT-S) 8.6-50 MG tablet Take 1 tablet 2 (two) times daily by mouth. 03/22/17  Yes Lavina Hamman, MD  rivaroxaban (XARELTO) 10 MG TABS tablet Take 1 tablet (10 mg total) daily by mouth. Patient not taking: Reported on 03/30/2017 04/21/17   Lavina Hamman, MD  rivaroxaban (XARELTO) 10 MG TABS tablet Take 1 tablet (10 mg total) daily by mouth. Patient not taking: Reported on 03/30/2017 03/23/17   Lavina Hamman, MD    Physical Exam: Vitals:   03/30/17 2030 03/30/17 2100 03/30/17 2130 03/30/17 2200  BP: 109/71 110/62 108/65 120/73  Pulse: (!) 107 (!) 103 (!) 105 (!) 106  Resp: (!) 25 (!) 25 (!) 24 (!) 25  Temp:      TempSrc:      SpO2: 99% 100% 96% 100%  Weight:      Height:        Constitutional: NAD, calm, comfortable Eyes: PERRL, lids and conjunctivae normal ENMT: Mucous membranes are moist. Posterior pharynx clear of any exudate or lesions.Normal dentition.  Neck: normal, supple, no masses, no thyromegaly Respiratory: clear to auscultation bilaterally, no wheezing, no crackles. Normal respiratory effort. No accessory muscle use.  Cardiovascular: Regular rate and rhythm, no murmurs / rubs / gallops. No extremity edema. 2+ pedal pulses. No carotid bruits.  Abdomen: no tenderness, no masses palpated. No hepatosplenomegaly. Bowel sounds positive.  Musculoskeletal: no clubbing / cyanosis. No joint deformity upper and lower extremities. Good ROM, no contractures. Normal muscle tone.  Skin: no rashes, lesions, ulcers. No induration Neurologic: CN 2-12 grossly intact. Sensation intact, DTR normal. Strength 5/5 in all 4.  Psychiatric: Normal judgment and insight. Alert and oriented x 3. Normal mood.    Labs on Admission: I have personally reviewed following labs and imaging studies  CBC: Recent Labs  Lab 03/30/17 1504  WBC 6.8  HGB 6.4*  HCT 21.1*  MCV 89.0  PLT 875   Basic Metabolic Panel: Recent  Labs  Lab 03/30/17 1504  NA 128*  K 4.0  CL 98*  CO2 21*  GLUCOSE 96  BUN 9  CREATININE 1.01  CALCIUM 8.5*   GFR: Estimated Creatinine Clearance: 83.6 mL/min (by C-G formula based on SCr of 1.01 mg/dL). Liver Function Tests: No results for input(s): AST, ALT, ALKPHOS, BILITOT, PROT, ALBUMIN in the last 168 hours. No results for input(s): LIPASE, AMYLASE in the last 168 hours. No results for input(s): AMMONIA in the last 168 hours. Coagulation Profile: No results for input(s): INR, PROTIME in the last 168 hours. Cardiac Enzymes: Recent Labs  Lab 03/30/17 1504  CKTOTAL 62   BNP (last 3 results) No results for input(s): PROBNP in the last 8760 hours. HbA1C: No results for input(s): HGBA1C in the last 72 hours. CBG: No results for input(s): GLUCAP in the last 168 hours. Lipid Profile: No results for input(s): CHOL, HDL, LDLCALC, TRIG, CHOLHDL, LDLDIRECT in the last 72 hours. Thyroid Function Tests: No results for input(s): TSH, T4TOTAL, FREET4, T3FREE, THYROIDAB in the last 72 hours. Anemia Panel: Recent Labs    03/30/17 1643 03/30/17 1745  VITAMINB12 505  --   FOLATE 7.8  --   FERRITIN 90  --   TIBC 300  --   IRON 36*  --   RETICCTPCT  --  9.3*   Urine analysis:    Component Value Date/Time   COLORURINE YELLOW 02/23/2014 1707   APPEARANCEUR CLEAR 02/23/2014 1707   LABSPEC 1.005 02/23/2014 1707   PHURINE 7.0 02/23/2014 1707   GLUCOSEU NEGATIVE 02/23/2014 1707   HGBUR NEGATIVE 02/23/2014 1707   BILIRUBINUR NEGATIVE 02/23/2014 1707   KETONESUR NEGATIVE 02/23/2014 1707   PROTEINUR NEGATIVE 02/23/2014 1707   UROBILINOGEN 0.2 02/23/2014 1707   NITRITE NEGATIVE 02/23/2014 1707   LEUKOCYTESUR NEGATIVE 02/23/2014 1707    Radiological Exams on Admission: Ct Angio Chest Pe W And/or Wo Contrast  Result Date: 03/30/2017 CLINICAL DATA:  Short of breath and chest pain EXAM: CT ANGIOGRAPHY CHEST WITH CONTRAST TECHNIQUE: Multidetector CT imaging of the chest was  performed using the standard protocol during bolus administration of intravenous contrast. Multiplanar CT image reconstructions and MIPs were obtained to evaluate the vascular anatomy. CONTRAST:  142mL ISOVUE-370 IOPAMIDOL (ISOVUE-370) INJECTION 76% COMPARISON:  03/16/2017 FINDINGS: Cardiovascular: There are no filling defects in the pulmonary arterial tree to suggest acute pulmonary thromboembolism. Atherosclerotic calcifications in the thoracic aorta are noted. There is no evidence of dissection or aortic aneurysm. Three vessel coronary artery calcifications are noted. Mediastinum/Nodes: 14 mm right paratracheal short axis diameter lymph node on image 41. Several other scattered smaller lymph nodes are present in the mediastinum. There is mildly prominent peribronchovascular soft tissue in the hilar regions. Lungs/Pleura: Loculated pleural fluid is seen surrounding the right lung. It is present at the posterior right lung base and around the right lung apex. Extensive ground-glass opacification and consolidation is present within the right upper lobe a more right middle lobe, and superior segment of the right lower lobe. Ground-glass opacities and nodules in the left upper lobe have slightly improved. Largest nodule on the left measures 8 mm on image 56. Nodular densities at the posterior left lung base have also developed. There are metallic densities inferior to the right hilum and in the right lower lobe laterally compatible with postoperative changes. Upper Abdomen: Gallstones are present. Liver is unremarkable. Visualized spleen is unremarkable. Splenule is noted. Musculoskeletal: No vertebral compression deformity. Review of the MIP images confirms the above findings. IMPRESSION: No evidence of acute pulmonary thromboembolism. Pulmonary parenchymal  disease has worsened, particularly throughout the right lung. This characterize by consolidation and ground-glass opacities. Findings are most consistent with a  progressive inflammatory process. Mediastinal adenopathy has increased since the prior study. Differential diagnosis includes both inflammatory and neoplastic etiology. There are nodules in the left lung, and the largest in the left upper lobe measures 8 mm. Non-contrast chest CT at 3-6 months is recommended. If the nodules are stable at time of repeat CT, then future CT at 18-24 months (from today's scan) is considered optional for low-risk patients, but is recommended for high-risk patients. This recommendation follows the consensus statement: Guidelines for Management of Incidental Pulmonary Nodules Detected on CT Images: From the Fleischner Society 2017; Radiology 2017; 284:228-243. Loculated right pleural effusion has increased in volume. Cholelithiasis. Aortic Atherosclerosis (ICD10-I70.0). Electronically Signed   By: Marybelle Killings M.D.   On: 03/30/2017 18:56   Ct Extremity Lower Left W Contrast  Result Date: 03/30/2017 CLINICAL DATA:  Discoloration and worsening left leg pain. Leg edema. Negative Doppler. Question proximal DVT or fasciitis. EXAM: CT OF THE LOWER LEFT EXTREMITY WITH CONTRAST TECHNIQUE: Multidetector CT imaging of the lower left extremity was performed according to the standard protocol following intravenous contrast administration. COMPARISON:  Left lower extremity CT 03/19/2017 from the distal thigh through the ankle. CONTRAST:  137mL ISOVUE-370 IOPAMIDOL (ISOVUE-370) INJECTION 76% FINDINGS: Bones/Joint/Cartilage No acute osseous abnormality from the iliac crest through the foot. No periosteal reaction or bony destructive change. No hip, knee, or ankle joint effusion joint effusion. Ligaments Suboptimally assessed by CT. Muscles and Tendons No intramuscular fluid collection, preservation of normal fatty striations. There is facial thickening of the posteromedial compartment of the calf without focal fluid collection. Quadriceps, patellar, and Achilles tendons are intact. Soft tissues No  filling defects within the left common or external iliac veins. No visualized filling defects within the superficial femoral or popliteal veins, contrast bolus timing limits assessment. Evaluation of the saphenous vein including previous area of thrombus distal to the knee is not well assessed due to bolus timing. There is stranding about the distal saphenous vein at the ankle, similar to prior exam. Arterial vascular calcifications are seen, exam not tailored for arterial evaluation. Circumferential subcutaneous edema with skin thickening throughout the left lower extremity, most prominent distal to the knee, similar to prior exam. No soft tissue air. No drainable soft tissue fluid collection. IMPRESSION: 1. No filling defects within the left common or external iliac veins to suggest proximal DVT. 2. Superficial thrombophlebitis of the greater saphenous vein distal to the knee on prior exam is not well assessed currently due to bolus timing. Persistent stranding about the greater saphenous vein at the ankle. 3. Persistent subcutaneous tissue edema and skin thickening throughout the left lower extremity, most prominent distal to the knee, nonspecific. Mild facial thickening about the posteromedial calf without drainable fluid collection. Findings may be infectious or inflammatory. No focal fluid collection or soft tissue air. 4. Arterial calcifications, exam not tailored for detailed arterial evaluation. Electronically Signed   By: Jeb Levering M.D.   On: 03/30/2017 21:48    EKG: Independently reviewed.  Assessment/Plan Principal Problem:   Consolidation lung ([place]) Active Problems:   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[facility])   Iron deficiency anemia   Hx of cancer of lung   Left leg swelling    1. Consolidation of lung - DDX is HCAP vs lymphangitic carcinomatosis 1. Seems to be having worsening lung disease over past month PNA vs lymphangitic carcinomatosis. 2.  Oddly enough had same thing  in Nov last year in same lung that they were worried was [facility] An, plan was to refer him to pulm, but it had resolved / improved on repeat CT in Jan without any specific treatment. 3. Will go ahead and treat as PNA to see if it resolves.  Though it looks worrisome for LC to me and Lymphangitic cancer spread would also explain leg swelling. 4. Will continue HCAP coverage for now 5. PNA pathway 6. Call pulm in AM 7. Call Onc in AM 2. Iron Def anemia - with HGB 6.4 1. Transfuse 1 unit for HGB 6.4, possible symptomatic anemia 2. Iron level still low despite recently increasing iron to TID 3. Hemoccult negative 3. Left leg swelling - 1. 2 neg [place] and now a neg CT have effectively ruled out DVT as the cause. 2. Getting HCAP ABx coverage as above, so cellulitis is covered 3. Im concerned however about possible lymphangitic cancer spread which could easily explain the swelling of leg as well as lung findings.  DVT prophylaxis: Lovenox Code Status: Full Family Communication: No family in room Disposition Plan: Home after admit Consults called: None Admission status: Admit to inpatient   Etta Quill DO [facility] Pager [phone]  If 7AM-7PM, please contact day team taking care of patient [email or web address] Password [email or web address]  03/30/2017, 11:13 PM

## 2017-03-30 NOTE — ED Notes (Signed)
ED Provider at bedside. 

## 2017-03-30 NOTE — ED Notes (Signed)
Pt returned from CT °

## 2017-03-30 NOTE — ED Notes (Signed)
Hanah-RN & Charlett Nose EMT notified of abnormal CG-4

## 2017-03-30 NOTE — ED Notes (Signed)
Patient transported to CT 

## 2017-03-30 NOTE — Telephone Encounter (Signed)
Please call,she needs to give you an update on the patient.

## 2017-03-30 NOTE — Progress Notes (Signed)
Pharmacy Antibiotic Note  Trevor Griffin is a 61 y.o. male admitted on 03/30/2017 with pneumonia.  Pharmacy has been consulted for Vancomycin dosing. Patient has Zosyn 3.375g IV x1 ordered per MD.  SCr is 1.01 with estimated CrCl ~ 83 mL/min. WBC is within normal limits. Patient came in with low temperature. Lactate was 2.4 and down to 1.43 on repeat. CK 42.   Plan: Vancomycin 1500 mg IV x1, then 1g IV every 12 hours. Follow-up for continuation of Zosyn. Monitor renal function, culture results, and clinical status.   Height: 5\' 8"  (172.7 cm) Weight: 198 lb (89.8 kg) IBW/kg (Calculated) : 68.4  Temp (24hrs), Avg:97.2 F (36.2 C), Min:96.6 F (35.9 C), Max:97.6 F (36.4 C)  Recent Labs  Lab 03/30/17 1504 03/30/17 1521 03/30/17 1803  WBC 6.8  --   --   CREATININE 1.01  --   --   LATICACIDVEN  --  2.40* 1.43    Estimated Creatinine Clearance: 83.6 mL/min (by C-G formula based on SCr of 1.01 mg/dL).    Allergies  Allergen Reactions  . Hydromorphone Nausea Only and Other (See Comments)    Headaches also  . Nubain [Nalbuphine Hcl] Nausea Only and Other (See Comments)    Headaches also    Antimicrobials this admission: Vancomycin 11/26 >> Zosyn 11/26 >>  Dose adjustments this admission:   Microbiology results: 11/26 Blood >>  Thank you for allowing pharmacy to be a part of this patient's care.  Sloan Leiter, PharmD, BCPS, BCCCP Clinical Pharmacist Clinical phone 03/30/2017 until 11PM [phone] After hours, please call #[phone] 03/30/2017 7:51 PM

## 2017-03-30 NOTE — ED Provider Notes (Addendum)
[facility] PROGRESSIVE CARE Provider Note   CSN: [id number] Arrival date & time: 03/30/17  1432     History   Chief Complaint Chief Complaint  Patient presents with  . Leg Pain    HPI Trevor Griffin is a 61 y.o. male.CC:  Continued leg pain, progressive shortness of breath  HPI:  Since 69-year-old male. History of previous tongue cancer status post resection. He also had right lower lobelobectomy of his lung. History of ST elevation myocardial infarction. Previous alcohol abuse. Does not drink for many years. COPD,  Past Medical History:  Diagnosis Date  . Acute ST elevation myocardial infarction (STEMI) involving left anterior descending (LAD) coronary artery ([place]) 2008 and 2009   5/'08 - DES x 4 to p-m LAD; 2/'09 - PTCA of  in-stent thrombosis  . Alcohol abuse   . Anxiety   . CAD S/P percutaneous coronary angioplasty 2008   a. Ant STEMI 5/08 w/ 4 Taxus DES p-m LAD (3.0 x 16,16,24,28 mm) ;  b. 2/'09:  Stopped Plavix-> Ant STEMI/stent thrombosis in LAD (PTCA - 3.8 mm), LCX 43m/d;  c. 10/12: Cath/PCI: p-m LAD stents 10-20% ISR, mLAD 40%, CFX 30-40%, mCFX 90% (Promus Premier DES 3.0 mm x 20 mm - 3.61mm ), EF 35%; d. cath 2/17/'16 no Sig CAD, patent LAD & Cx stents, EF 40-45%  . Chronic pain   . COPD (chronic obstructive pulmonary disease) ([place])   . Depression   . Fatigue    Sleep study 6/08 was negative. Sleep study 5/13 showed no OSA.  Marland Kitchen GERD (gastroesophageal reflux disease)   . Headache(784.0)    migraines  . Hx of radiation therapy 10/25/12-12/10/12    Right Tongue/ Bilateral Neck //Total Dose, 60 Gy in 30 Fractions  . Hyperlipidemia   . Hypertension   . Ischemic cardiomyopathy    a. Echo 08/20/11 with EF 50-55% (previously 35%);  b. 02/2014 Echo: EF 45-50-%, mid-apicalanteroseptal AK, Gr 1 dd, mild AI, midlly dil LA.  . Lung cancer ([place]) dx'd 2015  . Obesity   . Tobacco abuse   . Tongue cancer ([place]) dx'd 2014   s/p resection - [facility]    Patient Active Problem  List   Diagnosis Date Noted  . Myositis of left lower leg 04/04/2017  . Aspiration pneumonia ([place]) 03/30/2017  . Left leg swelling 03/30/2017  . Cellulitis 03/16/2017  . Pain and swelling of lower leg, left 03/04/2017  . Hx of cancer of lung 01/12/2017  . History of lung cancer 01/12/2017  . Bacterial skin infection 01/12/2017  . Iron deficiency anemia 10/13/2016  . Microcytic anemia 03/20/2016  . 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 Acquired hypothyroidism 03/20/2016  . Angina pectoris ([place]) 06/21/2014  . Nodule of right lung 04/04/2013  . 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[place]) 10/13/2012  . Tracheostomy stoma dilation ([place]) 09/27/2012  . Hemoptysis 09/27/2012  . Hemorrhage from tracheostomy stoma ([place]) 09/27/2012  . Lesion of tongue 07/07/2012  . Essential hypertension 08/25/2011  . Hip pain, bilateral 03/26/2011  . Low back pain 03/26/2011  . Anxiety 03/26/2011  . Alcohol abuse 02/26/2011  . Cardiomyopathy, ischemic-EF 45-50% by 2D Oct 2015 11/06/2009  . Dyslipidemia 09/26/2009  . SMOKER 09/26/2009  . CAD S/P multiple PCIs 09/26/2009    Past Surgical History:  Procedure Laterality Date  . COLONOSCOPY N/A 11/25/2016   Procedure: COLONOSCOPY;  Surgeon: Clarene Essex, MD;  Location: [facility];  Service: Endoscopy;  Laterality: N/A;  . CORONARY  ANGIOPLASTY WITH STENT PLACEMENT  2008-2012   a. 5/'08: Anterior STEMI: Proximal-mid LAD PCI: 4 total Taxus DES stents 3.0 mm x  16 mm, 16 mm, 24 mm and 28 mm.( post-dil 3.5 mm);; b. 2/'09: PTCA LAD in-stent thrombosis - 3.8 mm;; c. 10/'12: mCx Promus Element DES 3.0 x 20 mm- post-dil - 3.25 mm. -> patent in 2016  . ESOPHAGOGASTRODUODENOSCOPY (EGD) WITH PROPOFOL N/A 11/25/2016   Procedure: ESOPHAGOGASTRODUODENOSCOPY (EGD) WITH PROPOFOL;  Surgeon: Clarene Essex, MD;  Location: [facility];  Service: Endoscopy;  Laterality: N/A;  neonatal scope  . FRACTURE SURGERY Right    as a child,leg  .  LEFT HEART CATH AND CORONARY ANGIOGRAPHY  2006-2012   a) 2006: minimal CAD, normal EF;; b) 5/'08 - Ant STEMI - 100% p-mLAD (DES x 4 in LAD);; c)2/'09: Ant STEMI for IST/thrombosis of LAD ->PTCA, 80% mCx;; 10/'12: mCx 80-90% - DES PCI, patent LAD stents, EF ~35% - anterolateral AK, apical severe HK.  Marland Kitchen LEFT HEART CATHETERIZATION WITH CORONARY ANGIOGRAM N/A 06/21/2014   Procedure: LEFT HEART CATHETERIZATION WITH CORONARY ANGIOGRAM;  Surgeon: Burnell Blanks, MD;  Location: [facility] CATH LAB: No evidence of obstructive disease.  Patent LAD and circumflex stents.  EF 40-45%.  . LOBECTOMY Right 06/06/2013   Procedure: LOBECTOMY;  Surgeon: Melrose Nakayama, MD;  Location: [facility];  Service: Thoracic;  Laterality: Right;  . TRANSTHORACIC ECHOCARDIOGRAM  02/2014   EF 45-50%.  Akinesis of the mid apical anteroseptal wall.  GR 1 DD.  Mild MR.  . VIDEO ASSISTED THORACOSCOPY (VATS)/WEDGE RESECTION Right 06/06/2013   Procedure: VIDEO ASSISTED THORACOSCOPY (VATS)/WEDGE RESECTION;  Surgeon: Melrose Nakayama, MD;  Location: [facility];  Service: Thoracic;  Laterality: Right;       Home Medications    Prior to Admission medications   Medication Sig Start Date End Date Taking? Authorizing Provider  aspirin 81 MG tablet Take 1 tablet (81 mg total) by mouth daily. Patient taking differently: Take 162 mg 2 (two) times daily by mouth.  02/25/14  Yes Rogelia Mire, NP  atorvastatin (LIPITOR) 80 MG tablet Take 1 tablet (80 mg total) by mouth daily. 02/25/14  Yes Rogelia Mire, NP  B Complex-C (B-COMPLEX WITH VITAMIN C) tablet Take 1 tablet daily by mouth. 03/22/17  Yes Lavina Hamman, MD  buPROPion [facility] SR) 150 MG 12 hr tablet Take 1 tablet (150 mg total) by mouth 2 (two) times daily. 02/25/14  Yes Rogelia Mire, NP  esomeprazole (NEXIUM) 20 MG capsule Take 20 mg by mouth daily.    Yes [provider]  ferrous sulfate 325 (65 FE) MG tablet Take 1 tablet (325 mg total) 3 (three)  times daily with meals by mouth. 03/22/17 03/22/18 Yes Lavina Hamman, MD  guaiFENesin (MUCINEX) 600 MG 12 hr tablet Take 1 tablet (600 mg total) 2 (two) times daily by mouth. 03/22/17  Yes Lavina Hamman, MD  levothyroxine (SYNTHROID, LEVOTHROID) 100 MCG tablet Take 1 tablet (100 mcg total) by mouth daily before breakfast. 01/12/17  Yes Gorsuch, Ni, MD  methocarbamol (ROBAXIN) 500 MG tablet Take 1 tablet (500 mg total) every 8 (eight) hours as needed by mouth for muscle spasms. 03/22/17  Yes Lavina Hamman, MD  metoprolol tartrate (LOPRESSOR) 25 MG tablet Take 0.5 tablets (12.5 mg total) by mouth 2 (two) times daily. 02/25/14  Yes Rogelia Mire, NP  Multiple Vitamins-Minerals (MULTIVITAMIN WITH MINERALS) tablet Take 1 tablet by mouth daily.     Yes [provider]  nitroGLYCERIN (NITROSTAT) 0.4 MG SL tablet Place 1 tablet (0.4 mg total) under the tongue every 5 (five) minutes as needed (up to 3 doses). For chest pain Patient taking differently: Place 0.4 mg under the tongue every 5 (five) minutes as needed for chest pain (up to 3 doses).  02/25/14  Yes Rogelia Mire, NP  polyethylene glycol [facility] / GLYCOLAX) packet Take 17 g daily by mouth. 03/23/17  Yes Lavina Hamman, MD  senna-docusate (SENOKOT-S) 8.6-50 MG tablet Take 1 tablet 2 (two) times daily by mouth. 03/22/17  Yes Lavina Hamman, MD  amoxicillin-clavulanate (AUGMENTIN) 875-125 MG tablet Take 1 tablet by mouth 2 (two) times daily for 3 days. 04/04/17 04/07/17  Mariel Aloe, MD  feeding supplement, ENSURE ENLIVE, (ENSURE ENLIVE) LIQD Take 237 mLs by mouth 4 (four) times daily. 04/04/17   Mariel Aloe, MD  ondansetron (ZOFRAN-ODT) 4 MG disintegrating tablet Take 1 tablet (4 mg total) by mouth every 8 (eight) hours as needed for nausea or vomiting. 04/04/17   Mariel Aloe, MD  oxyCODONE (OXY IR/ROXICODONE) 5 MG immediate release tablet Take 1 tablet (5 mg total) by mouth every 4 (four) hours as needed for severe  pain. 04/04/17   Mariel Aloe, MD  predniSONE (DELTASONE) 20 MG tablet Take 1 tablet (20 mg total) by mouth daily with breakfast for 5 days. 04/05/17 04/10/17  Mariel Aloe, MD    Family History Family History  Problem Relation Age of Onset  . Kidney disease Mother        CHRONIC  . Cancer Sister   . Liver disease Sister        LIVER FAILURE  . Heart attack Brother     Social History Social History   Tobacco Use  . Smoking status: Current Every Day Smoker    Packs/day: 0.50    Years: 46.00    Pack years: 23.00    Types: Cigars  . Smokeless tobacco: Never Used  Substance Use Topics  . Alcohol use: Yes    Comment: occ  . Drug use: No     Allergies   Hydromorphone and Nubain [nalbuphine hcl]   Review of Systems Review of Systems  Constitutional: Negative for appetite change, chills, diaphoresis, fatigue and fever.  HENT: Negative for mouth sores, sore throat and trouble swallowing.   Eyes: Negative for visual disturbance.  Respiratory: Positive for shortness of breath. Negative for cough, chest tightness and wheezing.   Cardiovascular: Positive for chest pain and leg swelling.  Gastrointestinal: Negative for abdominal distention, abdominal pain, diarrhea, nausea and vomiting.  Endocrine: Negative for polydipsia, polyphagia and polyuria.  Genitourinary: Negative for dysuria, frequency and hematuria.  Musculoskeletal: Negative for gait problem.  Skin: Negative for color change, pallor and rash.  Neurological: Negative for dizziness, syncope, light-headedness and headaches.  Hematological: Does not bruise/bleed easily.  Psychiatric/Behavioral: Negative for behavioral problems and confusion.     Physical Exam Updated Vital Signs BP 116/81 (BP Location: Left Arm)   Pulse 76   Temp (!) 97.4 F (36.3 C) (Axillary)   Resp 18   Ht 5\' 8"  (1.727 m)   Wt 87.1 kg (192 lb 0.3 oz)   SpO2 100%   BMI 29.20 kg/m   Physical Exam  Constitutional: He is oriented to  person, place, and time. He appears well-developed and well-nourished. No distress.  HENT:  Head: Normocephalic.  Eyes: Conjunctivae are normal. Pupils are equal, round, and reactive to light. No scleral icterus.  Neck: Normal range of motion. Neck supple. No thyromegaly present.  Cardiovascular: Normal rate and regular rhythm. Exam reveals no gallop and no friction rub.  No murmur heard. Pulmonary/Chest: Effort normal and breath sounds normal. No respiratory distress. He has no wheezes. He has no rales.  Abdominal: Soft. Bowel sounds are normal. He exhibits no distension. There is no tenderness. There is no rebound.  Musculoskeletal: Normal range of motion.  Neurological: He is alert and oriented to person, place, and time.  Skin: Skin is warm and dry. No rash noted.  Bilateral LE swelling, symmetric.  Psychiatric: He has a normal mood and affect. His behavior is normal.     ED Treatments / Results  Labs (all labs ordered are listed, but only abnormal results are displayed) Labs Reviewed  BASIC METABOLIC PANEL - Abnormal; Notable for the following components:      Result Value   Sodium 128 (*)    Chloride 98 (*)    CO2 21 (*)    Calcium 8.5 (*)    All other components within normal limits  CBC - Abnormal; Notable for the following components:   RBC 2.37 (*)    Hemoglobin 6.4 (*)    HCT 21.1 (*)    RDW 22.5 (*)    All other components within normal limits  IRON AND TIBC - Abnormal; Notable for the following components:   Iron 36 (*)    Saturation Ratios 12 (*)    All other components within normal limits  SEDIMENTATION RATE - Abnormal; Notable for the following components:   Sed Rate 70 (*)    All other components within normal limits  C-REACTIVE PROTEIN - Abnormal; Notable for the following components:   CRP 10.3 (*)    All other components within normal limits  HEMOGLOBIN AND HEMATOCRIT, BLOOD - Abnormal; Notable for the following components:   Hemoglobin 6.6 (*)     HCT 21.8 (*)    All other components within normal limits  BASIC METABOLIC PANEL - Abnormal; Notable for the following components:   Sodium 129 (*)    Chloride 99 (*)    Calcium 8.1 (*)    All other components within normal limits  CBC WITH DIFFERENTIAL/PLATELET - Abnormal; Notable for the following components:   RBC 2.91 (*)    Hemoglobin 8.2 (*)    HCT 25.4 (*)    RDW 20.6 (*)    All other components within normal limits  GLUCOSE, CAPILLARY - Abnormal; Notable for the following components:   Glucose-Capillary 121 (*)    All other components within normal limits  BASIC METABOLIC PANEL - Abnormal; Notable for the following components:   Sodium 130 (*)    Chloride 98 (*)    Glucose, Bld 129 (*)    Calcium 8.4 (*)    All other components within normal limits  CBC - Abnormal; Notable for the following components:   RBC 3.18 (*)    Hemoglobin 9.0 (*)    HCT 28.3 (*)    RDW 20.8 (*)    All other components within normal limits  I-STAT CG4 LACTIC ACID, ED - Abnormal; Notable for the following components:   Lactic Acid, Venous 2.40 (*)    All other components within normal limits  CULTURE, BLOOD (ROUTINE X 2)  CULTURE, BLOOD (ROUTINE X 2)  VITAMIN B12  FOLATE  FERRITIN  CK  HIV ANTIBODY (ROUTINE TESTING)  I-STAT TROPONIN, ED  I-STAT CG4 LACTIC ACID, ED  POC OCCULT BLOOD, ED  I-STAT CG4 LACTIC ACID, ED  TYPE AND SCREEN  PREPARE RBC (CROSSMATCH)  PREPARE RBC (CROSSMATCH)    EKG  EKG Interpretation  Date/Time:  Monday March 30 2017 14:37:49 EST Ventricular Rate:  100 PR Interval:  178 QRS Duration: 88 QT Interval:  350 QTC Calculation: 451 R Axis:   28 Text Interpretation:  Normal sinus rhythm Normal ECG When compared with ECG of 01/14/2017, No significant change was found Confirmed by Delora Fuel ([id number]) on 03/30/2017 11:55:53 PM       Radiology No results found.  Procedures Procedures (including critical care time)  Medications Ordered in ED Medications   sodium chloride 0.9 % bolus 1,000 mL (0 mLs Intravenous Stopped 03/30/17 1754)  iopamidol (ISOVUE-370) 76 % injection (100 mLs Intravenous Contrast Given 03/30/17 1759)  piperacillin-tazobactam (ZOSYN) IVPB 3.375 g (0 g Intravenous Stopped 03/30/17 2030)  sodium chloride 0.9 % bolus 1,000 mL (0 mLs Intravenous Stopped 03/30/17 2225)  vancomycin (VANCOCIN) 1,500 mg in sodium chloride 0.9 % 500 mL IVPB (0 mg Intravenous Stopped 03/30/17 2245)  ondansetron (ZOFRAN) injection 4 mg (4 mg Intravenous Given 03/30/17 2022)  morphine 4 MG/ML injection 4 mg (4 mg Intravenous Given 03/31/17 0339)  0.9 %  sodium chloride infusion (250 mLs Intravenous New Bag/Given 03/31/17 0216)  0.9 %  sodium chloride infusion ( Intravenous Stopped 04/01/17 1021)  furosemide (LASIX) injection 20 mg (20 mg Intravenous Given 04/01/17 0824)  oxyCODONE (Oxy IR/ROXICODONE) immediate release tablet 5 mg (5 mg Oral Given 04/01/17 1631)  gadobenate dimeglumine (MULTIHANCE) injection 18 mL (18 mLs Intravenous Contrast Given 04/03/17 1859)     Initial Impression / Assessment and Plan / ED Course  I have reviewed the triage vital signs and the nursing notes.  Pertinent labs & imaging results that were available during my care of the patient were reviewed by me and considered in my medical decision making (see chart for details).     No PE. Care discussed with hospitalist. Patient will be admitted for progressive leg pain and swelling. May be related to pulmonary process,? Carcinomatosis  Final Clinical Impressions(s) / ED Diagnoses   Final diagnoses:  Leg swelling  Dyspnea, unspecified type    ED Discharge Orders        Ordered    predniSONE (DELTASONE) 20 MG tablet  Daily with breakfast     04/04/17 1216    oxyCODONE (OXY IR/ROXICODONE) 5 MG immediate release tablet  Every 4 hours PRN     04/04/17 1207    ondansetron (ZOFRAN-ODT) 4 MG disintegrating tablet  Every 8 hours PRN     04/04/17 1207    feeding  supplement, ENSURE ENLIVE, (ENSURE ENLIVE) LIQD  4 times daily     04/04/17 1207    amoxicillin-clavulanate (AUGMENTIN) 875-125 MG tablet  2 times daily     04/04/17 1207    Increase activity slowly     04/04/17 1207    Diet - low sodium heart healthy     04/04/17 1207    Call MD for:  severe uncontrolled pain     04/04/17 1207    Call MD for:  difficulty breathing, headache or visual disturbances     04/04/17 1207       Tanna Furry, MD 04/07/17 5170    Tanna Furry, MD 05/07/17 1441

## 2017-03-30 NOTE — Telephone Encounter (Signed)
Pt of Dr. Ellyn Hack. Recently seen by Isaac Laud and scheduled for a 1 week f/u tomorrow. I spoke w Morey Hummingbird of [facility] services, who called pt wife today to schedule evaluation in home/admission for home health.  Notes wife does not want home health out to admit patient today d/t multiple ongoing concerns: Pt dyspneic at rest, pale, decreased PO intake (food and fluid), amber urine, increased swelling in leg  Per her discussion, Wife planned to take patient to [facility] ED  I called wife to confirm this and to get firsthand narrative. she notes in addition to problems noted above, pt has been very fatigued.  As they had discussed w Isaac Laud at last visit, it was found that Hgb had dropped prior to his hospital discharge. Also notes that while the color of his leg improved, pain and swelling not any better, however.  I affirmed that eval in ED appropriate and recommended she take him to hospital - will plan to keep appt tomorrow if he is not admitted. Forwarded to provider team for review/further recommendations.

## 2017-03-30 NOTE — Progress Notes (Signed)
LLE venous duplex prelim: negative for DVT. Superficial thrombosis of the GSV appears to have completely resolved. Landry Mellow, RDMS, RVT

## 2017-03-30 NOTE — ED Triage Notes (Signed)
Per [facility], Pt was seen here one week ago with discoloration and worsening leg pain and returns with the same. MDs are unaware of what is causing the pain. EMS reports increase of SOB and Chest pain that started about a week ago. Pt was to take Blood thinner after he was discharged, but has been unable to pick up medication. Vitals per EMS: 124/78 (93), 98 HR, 24 RR, 99% on RA, 114 CBG

## 2017-03-31 ENCOUNTER — Ambulatory Visit: Payer: Medicare Other | Admitting: Physician Assistant

## 2017-03-31 DIAGNOSIS — I809 Phlebitis and thrombophlebitis of unspecified site: Secondary | ICD-10-CM

## 2017-03-31 DIAGNOSIS — I5032 Chronic diastolic (congestive) heart failure: Secondary | ICD-10-CM

## 2017-03-31 DIAGNOSIS — D649 Anemia, unspecified: Secondary | ICD-10-CM

## 2017-03-31 LAB — HEMOGLOBIN AND HEMATOCRIT, BLOOD
HEMATOCRIT: 21.8 % — AB (ref 39.0–52.0)
Hemoglobin: 6.6 g/dL — CL (ref 13.0–17.0)

## 2017-03-31 LAB — PREPARE RBC (CROSSMATCH)

## 2017-03-31 LAB — HIV ANTIBODY (ROUTINE TESTING W REFLEX): HIV SCREEN 4TH GENERATION: NONREACTIVE

## 2017-03-31 MED ORDER — IPRATROPIUM-ALBUTEROL 0.5-2.5 (3) MG/3ML IN SOLN
3.0000 mL | Freq: Four times a day (QID) | RESPIRATORY_TRACT | Status: DC
  Administered 2017-03-31 – 2017-04-01 (×4): 3 mL via RESPIRATORY_TRACT
  Filled 2017-03-31 (×4): qty 3

## 2017-03-31 MED ORDER — FUROSEMIDE 10 MG/ML IJ SOLN
20.0000 mg | Freq: Two times a day (BID) | INTRAMUSCULAR | Status: AC
  Administered 2017-03-31 – 2017-04-01 (×2): 20 mg via INTRAVENOUS
  Filled 2017-03-31 (×2): qty 4

## 2017-03-31 MED ORDER — PIPERACILLIN-TAZOBACTAM 3.375 G IVPB 30 MIN
3.3750 g | Freq: Four times a day (QID) | INTRAVENOUS | Status: DC
  Filled 2017-03-31 (×2): qty 50

## 2017-03-31 MED ORDER — OXYCODONE HCL 5 MG PO TABS
5.0000 mg | ORAL_TABLET | ORAL | Status: DC | PRN
  Administered 2017-03-31 – 2017-04-04 (×14): 5 mg via ORAL
  Filled 2017-03-31 (×14): qty 1

## 2017-03-31 MED ORDER — PIPERACILLIN-TAZOBACTAM 3.375 G IVPB
3.3750 g | Freq: Three times a day (TID) | INTRAVENOUS | Status: DC
  Administered 2017-03-31 – 2017-04-04 (×11): 3.375 g via INTRAVENOUS
  Filled 2017-03-31 (×14): qty 50

## 2017-03-31 MED ORDER — SODIUM CHLORIDE 0.9 % IV SOLN
Freq: Once | INTRAVENOUS | Status: AC
  Administered 2017-03-31: 14:00:00 via INTRAVENOUS

## 2017-03-31 NOTE — Evaluation (Signed)
Clinical/Bedside Swallow Evaluation Patient Details  Name: Trevor Griffin MRN: [id number] Date of Birth: [birth year]  Today's Date: 03/31/2017 Time: SLP Start Time (ACUTE ONLY): 1300 SLP Stop Time (ACUTE ONLY): 1330 SLP Time Calculation (min) (ACUTE ONLY): 30 min  Past Medical History:  Past Medical History:  Diagnosis Date  . Acute ST elevation myocardial infarction (STEMI) involving left anterior descending (LAD) coronary artery ([place]) 2008 and 2009   5/'08 - DES x 4 to p-m LAD; 2/'09 - PTCA of  in-stent thrombosis  . Alcohol abuse   . Anxiety   . CAD S/P percutaneous coronary angioplasty 2008   a. Ant STEMI 5/08 w/ 4 Taxus DES p-m LAD (3.0 x 16,16,24,28 mm) ;  b. 2/'09:  Stopped Plavix-> Ant STEMI/stent thrombosis in LAD (PTCA - 3.8 mm), LCX 65m/d;  c. 10/12: Cath/PCI: p-m LAD stents 10-20% ISR, mLAD 40%, CFX 30-40%, mCFX 90% (Promus Premier DES 3.0 mm x 20 mm - 3.56mm ), EF 35%; d. cath 2/17/'16 no Sig CAD, patent LAD & Cx stents, EF 40-45%  . Chronic pain   . COPD (chronic obstructive pulmonary disease) ([place])   . Depression   . Fatigue    Sleep study 6/08 was negative. Sleep study 5/13 showed no OSA.  Marland Kitchen GERD (gastroesophageal reflux disease)   . Headache(784.0)    migraines  . Hx of radiation therapy 10/25/12-12/10/12    Right Tongue/ Bilateral Neck //Total Dose, 60 Gy in 30 Fractions  . Hyperlipidemia   . Hypertension   . Ischemic cardiomyopathy    a. Echo 08/20/11 with EF 50-55% (previously 35%);  b. 02/2014 Echo: EF 45-50-%, mid-apicalanteroseptal AK, Gr 1 dd, mild AI, midlly dil LA.  . Lung cancer ([place]) dx'd 2015  . Obesity   . Tobacco abuse   . Tongue cancer ([place]) dx'd 2014   s/p resection - [facility]   Past Surgical History:  Past Surgical History:  Procedure Laterality Date  . COLONOSCOPY N/A 11/25/2016   Procedure: COLONOSCOPY;  Surgeon: Clarene Essex, MD;  Location: [facility];  Service: Endoscopy;  Laterality: N/A;  . CORONARY ANGIOPLASTY WITH STENT PLACEMENT   2008-2012   a. 5/'08: Anterior STEMI: Proximal-mid LAD PCI: 4 total Taxus DES stents 3.0 mm x  16 mm, 16 mm, 24 mm and 28 mm.( post-dil 3.5 mm);; b. 2/'09: PTCA LAD in-stent thrombosis - 3.8 mm;; c. 10/'12: mCx Promus Element DES 3.0 x 20 mm- post-dil - 3.25 mm. -> patent in 2016  . ESOPHAGOGASTRODUODENOSCOPY (EGD) WITH PROPOFOL N/A 11/25/2016   Procedure: ESOPHAGOGASTRODUODENOSCOPY (EGD) WITH PROPOFOL;  Surgeon: Clarene Essex, MD;  Location: [facility];  Service: Endoscopy;  Laterality: N/A;  neonatal scope  . FRACTURE SURGERY Right    as a child,leg  . LEFT HEART CATH AND CORONARY ANGIOGRAPHY  2006-2012   a) 2006: minimal CAD, normal EF;; b) 5/'08 - Ant STEMI - 100% p-mLAD (DES x 4 in LAD);; c)2/'09: Ant STEMI for IST/thrombosis of LAD ->PTCA, 80% mCx;; 10/'12: mCx 80-90% - DES PCI, patent LAD stents, EF ~35% - anterolateral AK, apical severe HK.  Marland Kitchen LEFT HEART CATHETERIZATION WITH CORONARY ANGIOGRAM N/A 06/21/2014   Procedure: LEFT HEART CATHETERIZATION WITH CORONARY ANGIOGRAM;  Surgeon: Burnell Blanks, MD;  Location: [facility] CATH LAB: No evidence of obstructive disease.  Patent LAD and circumflex stents.  EF 40-45%.  . LOBECTOMY Right 06/06/2013   Procedure: LOBECTOMY;  Surgeon: Melrose Nakayama, MD;  Location: [facility];  Service: Thoracic;  Laterality: Right;  . TRANSTHORACIC ECHOCARDIOGRAM  02/2014  EF 45-50%.  Akinesis of the mid apical anteroseptal wall.  GR 1 DD.  Mild MR.  . VIDEO ASSISTED THORACOSCOPY (VATS)/WEDGE RESECTION Right 06/06/2013   Procedure: VIDEO ASSISTED THORACOSCOPY (VATS)/WEDGE RESECTION;  Surgeon: Melrose Nakayama, MD;  Location: [facility];  Service: Thoracic;  Laterality: Right;   HPI:  61 y.o. male with hx squamous cell carcinoma right tongue (Underwent trach, partial glossectomy with left radial forearm fasicocutaneous free flap reconstructiion, and bilateral selective neck dissection 09/16/12), radiation tx, mets to RLL s/p RLL lobectomy 2015, admitted with ongoing leg  pain, progressive SOB.  CTA chest is neg for PE; however, does show a worsening "inflamatory process" with ground glass opacities throughout the R lung, this compared to CT just earlier this month. W/U pending - DDX HCAP vs lymphangitic carcinomatosis, iron def anemia, left leg swelling.  Pt also describes ongoing difficulty swallowing, frequent choking episodes, inability to eat any meats/tough foods, occasional regurgitation.     Assessment / Plan / Recommendation Clinical Impression   Pt presents with a chronic dysphagia secondary to partial glossectomy and effects of radiation tx, leading to frequent choking, regurgitation, avoidance of certain foods and social situations involving food/drink.  Pt acknowledges significant impact surgery/radiation have had on his life.  Recommend proceeding with MBS to elucidate nature of dysphagia and determine potential strategies that may improve function.  Pt agrees -will schedule for next date.  SLP Visit Diagnosis: Dysphagia, unspecified (R13.10)    Aspiration Risk       Diet Recommendation   continue current diet pending study tomorrow  Medication Administration: Whole meds with liquid    Other  Recommendations Oral Care Recommendations: Oral care BID   Follow up Recommendations        Frequency and Duration            Prognosis        Swallow Study   General HPI: 61 y.o. male with hx squamous cell carcinoma right tongue (Underwent trach, partial glossectomy with left radial forearm fasicocutaneous free flap reconstructiion, and bilateral selective neck dissection 09/16/12), radiation tx, mets to RLL s/p RLL lobectomy 2015, admitted with ongoing leg pain, progressive SOB.  CTA chest is neg for PE; however, does show a worsening "inflamatory process" with ground glass opacities throughout the R lung, this compared to CT just earlier this month. W/U pending - DDX HCAP vs lymphangitic carcinomatosis, iron def anemia, left leg swelling.  Pt also  describes ongoing difficulty swallowing, frequent choking episodes, inability to eat any meats/tough foods, occasional regurgitation.   Type of Study: Bedside Swallow Evaluation Previous Swallow Assessment: none per records at [place] Diet Prior to this Study: Regular;Thin liquids Temperature Spikes Noted: No Respiratory Status: Nasal cannula History of Recent Intubation: No Behavior/Cognition: Alert;Cooperative Oral Cavity Assessment: Within Functional Limits Oral Care Completed by SLP: No Oral Cavity - Dentition: Dentures, top;Dentures, bottom Vision: Functional for self-feeding Self-Feeding Abilities: Able to feed self    Oral/Motor/Sensory Function Overall Oral Motor/Sensory Function: Moderate impairment Facial Strength: Reduced left Lingual ROM: Other (Comment)(s/p partial glossectomy with flap) Velum: Other (comment)(impaired 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)   Ice Chips Ice chips: Not tested   Thin Liquid Thin Liquid: Impaired Presentation: Straw Oral Phase Impairments: Reduced lingual movement/coordination Pharyngeal  Phase Impairments: Multiple swallows;Cough - Delayed(x1)    Nectar Thick Nectar Thick Liquid: Not tested   Honey Thick Honey Thick Liquid: Not tested   Puree Puree: Impaired Oral Phase Impairments: Reduced lingual movement/coordination Oral Phase  Functional Implications: Oral residue Pharyngeal Phase Impairments: Multiple swallows   Solid   GO   Solid: Impaired Presentation: Self Fed Oral Phase Impairments: Reduced lingual movement/coordination Pharyngeal Phase Impairments: Multiple swallows       Lexey Fletes L. Tivis Ringer, [place] CCC/SLP Pager [phone]  [name] [name] 03/31/2017,1:47 PM

## 2017-03-31 NOTE — Telephone Encounter (Signed)
The next option would be to convert to Eliquis (esp. In setting of anemia & ? Bleeding). ~2.5 mg BID.  Glenetta Hew, MD

## 2017-03-31 NOTE — Progress Notes (Signed)
Patient on cardiac monitor. Placed page to Dr Alcario Drought, who discontinued the order. No flu swab to be done. Patient is alert and oriented.Left leg swollen, dark brown, red, which is really causing his paine. Will follow Drs orders and monitor patient.

## 2017-03-31 NOTE — Telephone Encounter (Signed)
Agreed and aware of admission. Multiple issues, less so cardiac in nature, but more related to anemia. I have never seen the patient, but did have long discussion with the wife and the clinical pharmacist over the weekend from the hospital when she contacted the after hour answering service.

## 2017-03-31 NOTE — Progress Notes (Addendum)
PROGRESS NOTE    Kerby Borner [facility]  YBO:[id number] DOB: 1956-01-18 DOA: 03/30/2017 PCP: System, Pcp Not In    Brief Narrative:  61 year old male who presented with dyspnea. 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 Patient had a recent hospitalization November 12-18, for left leg thrombophlebitis/cellulitis. She was discharge with aspirin. At home he has been developing progressive dyspnea on exertion, and easy fatigability, limiting his physical activity. His left leg continued to be edematous, and ecchymotic. On initial physical examination blood pressure 109/71, heart rate 107, respiratory 25, saturation 96%. Is mucous membranes were moist, his lungs had decrease air movement with positive stress in the right apical lobe, scattered rhonchi. Heart S1-S2 present, rhythmic, no gallops, rubs or murmurs, the abdomen was protuberant but nontender, left lower extremity with nonpitting edema ++/+++ tense, with positive ecchymosis in the inner leg and thigh. No frank erythema or increased local temperature. White count 6.8, he will and 6.4, hematocrit 21.1, platelets 300. CT of the chest with right upper lobe infiltrate associated with a loculated right pleural effusion, left lower extremity CT with superficial thrombophlebitis of the greater saphenous vein distal to the knee, persistence of interstitial edema throughout the extremity, no drainable fluid, no soft tissue air. eKG with a normal sinus rhythm, 100 bpm, normal axis, normal intervals.  Patient was admitted to the hospital with the working diagnosis of right upper lobe pneumonia, complicated by symptomatic anemia, and left lower extremity thrombophlebitis   Assessment & Plan:   Principal Problem:   Consolidation lung ([place]) Active Problems:   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[facility])   Iron deficiency anemia   Hx  of cancer of lung   Left leg swelling   1. Right upper lobe pneumonia. Recurrent pneumonia, CT with dense infiltrate on the right upper lobe and also noted ground gals opacity at the left mid lung, concern for recurrent aspiration in the setting of tongue cancer. Will continue antibiotic therapy with Zosyn, will follow with speech evaluation, continue oxymetry monitoring and supplemental 02 per [place], to target 02 saturation greater than 92%. Will add bronchodilator with duoneb.   2. Acute on chronic symptomatic anemia/ iron deficiency. Patient had prbc transfusion on admission x1, with persistent anemia, will transfuse 2 more units and will follow cell count in am. Will continue iron supplementation. Patient with severe ecchymosis to left leg, than can explain worsening anemia. CT with no frank hematoma, will continue close monitoring, for now will hold on anticoagulants asa, but will continue enoxaparin for dvt prophylaxis.   3. Left lower extremity thrombophlebitis. Erythema and extension has improved, suggesting resolving phlebitis, will continue to hold on anticoagulation for now (asa), repeat US lower extremity with no DVT. No clinical signs of infectious fascitis. Stop Vancomycin. Will need close monitoring.   4. COPD stable with no exacerbation. Will add bronchodilator therapy with duoneb, will continue oxymetry monitoring and supplemental 02 per [place]. Recommend to keep Hb about 8.   5. Chronic and stable diastolic heart failure. Stable with no signs of exacerbation, will continue metoprolol. Hold on ace inh due to risk for worsening hypotension. Blood pressure systolic 27-78 mmHg. Will transfuse 2 units prbc, will give low dose furosemide after each unit. Target MAP 65 mmHg.  6. Hypothyroidism. Continue levothyroxine with good toleration.   DVT prophylaxis: enoxaparin  Code Status: full Family Communication: I spoke with patient's wife at the  bedside over the phone and all questions were  addressed.  Disposition Plan:  home   Consultants:     Procedures:     Antimicrobials:   Zosyn    Subjective: Patient is feeling better but still with significant dyspnea, stable left leg pain, no chest pain, no nausea or vomiting.   Objective: Vitals:   03/31/17 0225 03/31/17 0501 03/31/17 0943 03/31/17 1037  BP: 92/64 (!) 89/65 (!) 89/48 (!) 89/55  Pulse: 80 79 84   Resp: (!) 22 20 16    Temp: 98.2 F (36.8 C) 98.1 F (36.7 C) 97.9 F (36.6 C)   TempSrc: Axillary Axillary Axillary   SpO2: 100% 98% 100%   Weight:      Height:        Intake/Output Summary (Last 24 hours) at 03/31/2017 1146 Last data filed at 03/31/2017 6803 Gross per 24 hour  Intake 4100 ml  Output 550 ml  Net 3550 ml   Filed Weights   03/30/17 1436 03/31/17 0100  Weight: 89.8 kg (198 lb) 87.1 kg (192 lb 0.3 oz)    Examination:   General: Not in pain or dyspnea, deconditioned Neurology: Awake and alert, non focal  E ENT: mild pallor, no icterus, oral mucosa moist Cardiovascular: No JVD. S1-S2 present, rhythmic, no gallops, rubs, or murmurs. Left lower extremity edema +++/++++ tense non pitting extensive ecchymosis, palpable distal pulses, tender to palpation. Pulmonary: decreased breath sounds bilaterally, poorair movement, no wheezing, scattered rhonchi. Positive right upper lobe rales. Gastrointestinal. Abdomen protuberant, no organomegaly, non tender, no rebound or guarding Skin. No rashes Musculoskeletal: no joint deformities   This admission 11/27    prior admission 03/16/2017     Data Reviewed: I have personally reviewed following labs and imaging studies  CBC: Recent Labs  Lab 03/30/17 1504 03/31/17 0808  WBC 6.8  --   HGB 6.4* 6.6*  HCT 21.1* 21.8*  MCV 89.0  --   PLT 300  --    Basic Metabolic Panel: Recent Labs  Lab 03/30/17 1504  NA 128*  K 4.0  CL 98*  CO2 21*  GLUCOSE 96  BUN 9  CREATININE 1.01  CALCIUM 8.5*   GFR: Estimated Creatinine  Clearance: 82.5 mL/min (by C-G formula based on SCr of 1.01 mg/dL). Liver Function Tests: No results for input(s): AST, ALT, ALKPHOS, BILITOT, PROT, ALBUMIN in the last 168 hours. No results for input(s): LIPASE, AMYLASE in the last 168 hours. No results for input(s): AMMONIA in the last 168 hours. Coagulation Profile: No results for input(s): INR, PROTIME in the last 168 hours. Cardiac Enzymes: Recent Labs  Lab 03/30/17 1504  CKTOTAL 62   BNP (last 3 results) No results for input(s): PROBNP in the last 8760 hours. HbA1C: No results for input(s): HGBA1C in the last 72 hours. CBG: No results for input(s): GLUCAP in the last 168 hours. Lipid Profile: No results for input(s): CHOL, HDL, LDLCALC, TRIG, CHOLHDL, LDLDIRECT in the last 72 hours. Thyroid Function Tests: No results for input(s): TSH, T4TOTAL, FREET4, T3FREE, THYROIDAB in the last 72 hours. Anemia Panel: Recent Labs    03/30/17 1643 03/30/17 1745  VITAMINB12 505  --   FOLATE 7.8  --   FERRITIN 90  --   TIBC 300  --   IRON 36*  --   RETICCTPCT  --  9.3*      Radiology Studies: I have reviewed all of the imaging during this hospital visit personally     Scheduled Meds: . acetaminophen  1,000 mg Oral QHS  . aspirin EC  162 mg Oral BID  . atorvastatin  80 mg Oral Daily  . buPROPion  150 mg Oral BID WC  . enoxaparin (LOVENOX) injection  40 mg Subcutaneous Daily  . ferrous sulfate  325 mg Oral TID WC  . guaiFENesin  600 mg Oral BID  . levothyroxine  100 mcg Oral QAC breakfast  . metoprolol tartrate  12.5 mg Oral BID  . multivitamin with minerals  1 tablet Oral Daily  . pantoprazole  40 mg Oral Daily  . polyethylene glycol  17 g Oral Daily  . senna-docusate  1 tablet Oral BID   Continuous Infusions: . ceFEPime (MAXIPIME) IV 1 g (03/31/17 1057)  . vancomycin Stopped (03/31/17 1044)     LOS: 1 day        Aubrionna Istre Gerome Apley, MD [facility] Pager [phone]

## 2017-03-31 NOTE — Progress Notes (Signed)
Report received from [place]. Patient will be getting transfused. Room set up for this. Awaiting patient.

## 2017-03-31 NOTE — Progress Notes (Signed)
PROGRESS NOTE  I saw and evaluated the patient, I personally obtain the key portions of the history and physical exam, I reviewed the physician assistant student documentation and agree with the physician assistant student medical decision-making. Further assessment and plan are as follows:  Please see attending's daily progress note.  Mauricio Arrien M.D.   Aneesh Faller [facility]  ONG:[id number] DOB: 02-03-1956 DOA: 03/30/2017 PCP: System, Pcp Not In  Outpatient Specialists: Cardiology (Dr. Ellyn Hack), Oncology (Dr. Alvy Bimler)    Brief Narrative:  Patient is a 61 year old male who presented for left leg pain/swelling/discoloration and fatigue.  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  Patient was recently admitted for same left leg complaint, diagnosed w/ thrombophlebitis and cellulitis; discharged on 11/18 and found to be anemic.  Patient was to be anticoagulated s/p discharge, but was unable to fill his prescription.  Patient has been experiencing fatigue, SOB/DOE, weakness, and decreased appetite with weight loss "for a couple of months now".  Labs significant for RBC 2.37, hgb 6.4, RDW 22.5, venous lactic acid 2.4, ESR 70, TSH 23.039, Free T4 0.87.  FOBT negative.  Blood cx pending.  LLE venous US negative for DVT.  CT LLE noted superficial thrombophlebitis of greater saphenous vein distal to knee, negative for focal fluid collection or soft tissue air.  CTA chest negative for PE, though significant for worsening lung parenchymal disease particularly in right lung, left lung nodules, loculated right pleural effusion.  Received 1 unit pRBC on 11/26.     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)   Iron deficiency anemia   Hx of cancer of lung   Left leg swelling   Symptomatic iron deficiency  anemia -Fe 36, RBC 2.37, Hgb 6.4, RDW 22.5, elevated retic count -Transfuse 2 units pRBC as patient still symptomatic  -Continue iron supplementation -Continue to monitor -PT tx/eval as tolerated  RUL aspiration pneumonia with pleural effusion -Discontinue cefepime and vancomycin -Start IV Zosyn  -Swallow study to evaluate aspiration risk  Superficial thrombophlebitis  -Venous US of LLE negative for DVT -CT negative for DVT, showed superficial thrombophlebitis of greater saphenous vein distal to knee.  Negative for focal fluid collection or soft tissue air.    Congestive Heart Failure -ECHO in Oct 2015 showed EF 45-50% -O2 >97% on 2 L [place].  No O2 at home. -Give diuretic after blood transfusion to avoid fluid overload -Has been hypotensive since admission.  Monitor BP closely.  MAP 77 today -Continue home metoprolol  Hypothyroidism -TSH 23.039, free T4 0.87, patient asymptomatic -Continue home levothyroxine  DVT prophylaxis: Lovenox Code Status: Full Family Communication: Discussed with patient.  Dr. Cathlean Sauer spoke with patient's wife on phone. Disposition Plan: Discharge to home w/ home health when stable   Consultants:   None  Procedures:   None  Antimicrobials:  IV vancomycin 11/26 >> 11/27  IV cefepime 11/27  IV Zosyn 11/27 >>   Subjective: Patient states he has been experiencing worsening SOB, fatigue, and decreased appetite "for a couple of months now".  Reports 14 lb weight loss in the last "2 weeks" according to his home scale.  States he felt like he was "going to fall out" after walking to the bathroom in his home yesterday.  Patient is aware of his "low blood count".  Received 1  unity pRBC yesterday and tolerated this well.  Patient states left leg pain and swelling persists.  Denies chest pain, nausea/vomiting, abdominal pain, hematochezia, melena, dysuria.  Objective: Vitals:   03/31/17 0145 03/31/17 0225 03/31/17 0501 03/31/17 0943  BP: 106/63 92/64  (!) 89/65 (!) 89/48  Pulse: 93 80 79 84  Resp: 20 (!) _0 Temp: 97.7 F (36.5 C) 98.2 F (36.8 C) 98.1 F (36.7 C) 97.9 F (36.6 C)  TempSrc: Axillary Axillary Axillary Axillary  SpO2: 98% 100% 98% 100%  Weight:      Height:        Intake/Output Summary (Last 24 hours) at 03/31/2017 0954 Last data filed at 03/31/2017 0258 Gross per 24 hour  Intake 4100 ml  Output 550 ml  Net 3550 ml   Filed Weights   03/30/17 1436 03/31/17 0100  Weight: 89.8 kg (198 lb) 87.1 kg (192 lb 0.3 oz)    Examination:  General exam: Laying in hospital bed comfortably.  Appears fatigued. Respiratory system: Diminished breath sounds.  Rales and rhonchi in RUL.  Absent breath sounds on right from mid to base of lung.  On 2 L [place]. Cardiovascular system: RRR. No MGR heard.  1+ pedal edema in left lower extremity. Gastrointestinal system: Abdomen is obese nondistended, soft and nontender.  Central nervous system: Alert and oriented. No focal neurological deficits. Extremities: Right lower extremity - sensation intact, 2+ DP pulse, non-tender, no pedal edema. Left lower extremity: medial ecchymosis/erythema/dusky appearance from mid thigh to ankle.  Diffuse edema to thigh with 1+ pitting to knee.  Very tender, not warm, to touch.  1+ DP pulse.  See images below. Skin: No rashes, lesions or ulcers. Psychiatry: Judgement and insight appear normal. Mood appropriate, pleasant affect.     FROM TODAY, 11/27:      FROM PREVIOUS ADMISSION ON 11/12:        Data Reviewed: I have personally reviewed following labs and imaging studies  CBC: Recent Labs  Lab 03/30/17 1504 03/31/17 0808  WBC 6.8  --   HGB 6.4* 6.6*  HCT 21.1* 21.8*  MCV 89.0  --   PLT 300  --    Basic Metabolic Panel: Recent Labs  Lab 03/30/17 1504  NA 128*  K 4.0  CL 98*  CO2 21*  GLUCOSE 96  BUN 9  CREATININE 1.01  CALCIUM 8.5*   GFR: Estimated Creatinine Clearance: 82.5 mL/min (by C-G formula based on SCr of  1.01 mg/dL). Liver Function Tests: No results for input(s): AST, ALT, ALKPHOS, BILITOT, PROT, ALBUMIN in the last 168 hours. No results for input(s): LIPASE, AMYLASE in the last 168 hours. No results for input(s): AMMONIA in the last 168 hours. Coagulation Profile: No results for input(s): INR, PROTIME in the last 168 hours. Cardiac Enzymes: Recent Labs  Lab 03/30/17 1504  CKTOTAL 62   BNP (last 3 results) No results for input(s): PROBNP in the last 8760 hours. HbA1C: No results for input(s): HGBA1C in the last 72 hours. CBG: No results for input(s): GLUCAP in the last 168 hours. Lipid Profile: No results for input(s): CHOL, HDL, LDLCALC, TRIG, CHOLHDL, LDLDIRECT in the last 72 hours. Thyroid Function Tests: No results for input(s): TSH, T4TOTAL, FREET4, T3FREE, THYROIDAB in the last 72 hours. Anemia Panel: Recent Labs    03/30/17 1643 03/30/17 1745  VITAMINB12 505  --   FOLATE 7.8  --   FERRITIN 90  --   TIBC 300  --   IRON 36*  --  RETICCTPCT  --  9.3*   Urine analysis:    Component Value Date/Time   COLORURINE YELLOW 02/23/2014 1707   APPEARANCEUR CLEAR 02/23/2014 1707   LABSPEC 1.005 02/23/2014 1707   PHURINE 7.0 02/23/2014 1707   GLUCOSEU NEGATIVE 02/23/2014 1707   HGBUR NEGATIVE 02/23/2014 1707   BILIRUBINUR NEGATIVE 02/23/2014 1707   KETONESUR NEGATIVE 02/23/2014 1707   PROTEINUR NEGATIVE 02/23/2014 1707   UROBILINOGEN 0.2 02/23/2014 1707   NITRITE NEGATIVE 02/23/2014 1707   LEUKOCYTESUR NEGATIVE 02/23/2014 1707   Sepsis Labs: _0 (procalcitonin:4,lacticidven:4)  )No results found for this or any previous visit (from the past 240 hour(s)).       Radiology Studies: Ct Angio Chest Pe W And/or Wo Contrast  Result Date: 03/30/2017 CLINICAL DATA:  Short of breath and chest pain EXAM: CT ANGIOGRAPHY CHEST WITH CONTRAST TECHNIQUE: Multidetector CT imaging of the chest was performed using the standard protocol during bolus administration of  intravenous contrast. Multiplanar CT image reconstructions and MIPs were obtained to evaluate the vascular anatomy. CONTRAST:  137m ISOVUE-370 IOPAMIDOL (ISOVUE-370) INJECTION 76% COMPARISON:  03/16/2017 FINDINGS: Cardiovascular: There are no filling defects in the pulmonary arterial tree to suggest acute pulmonary thromboembolism. Atherosclerotic calcifications in the thoracic aorta are noted. There is no evidence of dissection or aortic aneurysm. Three vessel coronary artery calcifications are noted. Mediastinum/Nodes: 14 mm right paratracheal short axis diameter lymph node on image 41. Several other scattered smaller lymph nodes are present in the mediastinum. There is mildly prominent peribronchovascular soft tissue in the hilar regions. Lungs/Pleura: Loculated pleural fluid is seen surrounding the right lung. It is present at the posterior right lung base and around the right lung apex. Extensive ground-glass opacification and consolidation is present within the right upper lobe a more right middle lobe, and superior segment of the right lower lobe. Ground-glass opacities and nodules in the left upper lobe have slightly improved. Largest nodule on the left measures 8 mm on image 56. Nodular densities at the posterior left lung base have also developed. There are metallic densities inferior to the right hilum and in the right lower lobe laterally compatible with postoperative changes. Upper Abdomen: Gallstones are present. Liver is unremarkable. Visualized spleen is unremarkable. Splenule is noted. Musculoskeletal: No vertebral compression deformity. Review of the MIP images confirms the above findings. IMPRESSION: No evidence of acute pulmonary thromboembolism. Pulmonary parenchymal disease has worsened, particularly throughout the right lung. This characterize by consolidation and ground-glass opacities. Findings are most consistent with a progressive inflammatory process. Mediastinal adenopathy has increased  since the prior study. Differential diagnosis includes both inflammatory and neoplastic etiology. There are nodules in the left lung, and the largest in the left upper lobe measures 8 mm. Non-contrast chest CT at 3-6 months is recommended. If the nodules are stable at time of repeat CT, then future CT at 18-24 months (from today's scan) is considered optional for low-risk patients, but is recommended for high-risk patients. This recommendation follows the consensus statement: Guidelines for Management of Incidental Pulmonary Nodules Detected on CT Images: From the Fleischner Society 2017; Radiology 2017; 284:228-243. Loculated right pleural effusion has increased in volume. Cholelithiasis. Aortic Atherosclerosis (ICD10-I70.0). Electronically Signed   By: AMarybelle KillingsM.D.   On: 03/30/2017 18:56   Ct Extremity Lower Left W Contrast  Result Date: 03/30/2017 CLINICAL DATA:  Discoloration and worsening left leg pain. Leg edema. Negative Doppler. Question proximal DVT or fasciitis. EXAM: CT OF THE LOWER LEFT EXTREMITY WITH CONTRAST TECHNIQUE: Multidetector CT imaging of the lower left  extremity was performed according to the standard protocol following intravenous contrast administration. COMPARISON:  Left lower extremity CT 03/19/2017 from the distal thigh through the ankle. CONTRAST:  151m ISOVUE-370 IOPAMIDOL (ISOVUE-370) INJECTION 76% FINDINGS: Bones/Joint/Cartilage No acute osseous abnormality from the iliac crest through the foot. No periosteal reaction or bony destructive change. No hip, knee, or ankle joint effusion joint effusion. Ligaments Suboptimally assessed by CT. Muscles and Tendons No intramuscular fluid collection, preservation of normal fatty striations. There is facial thickening of the posteromedial compartment of the calf without focal fluid collection. Quadriceps, patellar, and Achilles tendons are intact. Soft tissues No filling defects within the left common or external iliac veins. No  visualized filling defects within the superficial femoral or popliteal veins, contrast bolus timing limits assessment. Evaluation of the saphenous vein including previous area of thrombus distal to the knee is not well assessed due to bolus timing. There is stranding about the distal saphenous vein at the ankle, similar to prior exam. Arterial vascular calcifications are seen, exam not tailored for arterial evaluation. Circumferential subcutaneous edema with skin thickening throughout the left lower extremity, most prominent distal to the knee, similar to prior exam. No soft tissue air. No drainable soft tissue fluid collection. IMPRESSION: 1. No filling defects within the left common or external iliac veins to suggest proximal DVT. 2. Superficial thrombophlebitis of the greater saphenous vein distal to the knee on prior exam is not well assessed currently due to bolus timing. Persistent stranding about the greater saphenous vein at the ankle. 3. Persistent subcutaneous tissue edema and skin thickening throughout the left lower extremity, most prominent distal to the knee, nonspecific. Mild facial thickening about the posteromedial calf without drainable fluid collection. Findings may be infectious or inflammatory. No focal fluid collection or soft tissue air. 4. Arterial calcifications, exam not tailored for detailed arterial evaluation. Electronically Signed   By: MJeb LeveringM.D.   On: 03/30/2017 21:48        Scheduled Meds: . acetaminophen  1,000 mg Oral QHS  . aspirin EC  162 mg Oral BID  . atorvastatin  80 mg Oral Daily  . buPROPion  150 mg Oral BID WC  . enoxaparin (LOVENOX) injection  40 mg Subcutaneous Daily  . ferrous sulfate  325 mg Oral TID WC  . guaiFENesin  600 mg Oral BID  . levothyroxine  100 mcg Oral QAC breakfast  . metoprolol tartrate  12.5 mg Oral BID  . multivitamin with minerals  1 tablet Oral Daily  . pantoprazole  40 mg Oral Daily  . polyethylene glycol  17 g Oral  Daily  . senna-docusate  1 tablet Oral BID   Continuous Infusions: . ceFEPime (MAXIPIME) IV Stopped (03/31/17 0537)  . vancomycin 1,000 mg (03/31/17 0944)     LOS: 1 day      BTanzaniaHall-Potvin, PA-S [facility] Pager [phone]  If 7PM-7AM, please contact night-coverage [email or web address] Password [email or web address]11/27/2018, 9:54 AM

## 2017-03-31 NOTE — Progress Notes (Signed)
PT Cancellation Note  Patient Details Name: Trevor Griffin MRN: [id number] DOB: 07-Oct-1955   Cancelled Treatment:    Reason Eval/Treat Not Completed: (P) Medical issues which prohibited therapy Pt hemoglobin 6.6 and is awaiting transfusion. PT will follow back tomorrow for evaluation.  [name] B. Migdalia Dk PT, DPT Acute Rehabilitation  [phone] Pager [phone]  [place] 03/31/2017, 1:22 PM

## 2017-03-31 NOTE — Care Management Note (Signed)
Case Management Note  Patient Details  Name: Trevor Griffin MRN: [id number] Date of Birth: 12-Apr-1956  Subjective/Objective:    Pt admitted with consolidation in lung. He is from home with spouse. Pt active with [facility] services.   Pt without PCP.             Action/Plan: Plan is for home once medically stable. MD please order PT/OT evals if appropriate. CM following for d/c needs, physician orders.  Expected Discharge Date:                  Expected Discharge Plan:     In-House Referral:     Discharge planning Services     Post Acute Care Choice:    Choice offered to:     DME Arranged:    DME Agency:     HH Arranged:    HH Agency:     Status of Service:  In process, will continue to follow  If discussed at Long Length of Stay Meetings, dates discussed:    Additional Comments:  Pollie Friar, RN 03/31/2017, 1:56 PM

## 2017-04-01 ENCOUNTER — Telehealth: Payer: Self-pay | Admitting: Internal Medicine

## 2017-04-01 ENCOUNTER — Inpatient Hospital Stay (HOSPITAL_COMMUNITY): Payer: Medicare Other

## 2017-04-01 DIAGNOSIS — D509 Iron deficiency anemia, unspecified: Secondary | ICD-10-CM

## 2017-04-01 DIAGNOSIS — R0600 Dyspnea, unspecified: Secondary | ICD-10-CM

## 2017-04-01 DIAGNOSIS — C023 Malignant neoplasm of anterior two-thirds of tongue, part unspecified: Secondary | ICD-10-CM

## 2017-04-01 DIAGNOSIS — J181 Lobar pneumonia, unspecified organism: Secondary | ICD-10-CM

## 2017-04-01 DIAGNOSIS — M7989 Other specified soft tissue disorders: Secondary | ICD-10-CM

## 2017-04-01 LAB — BPAM RBC
BLOOD PRODUCT EXPIRATION DATE: 201811292359
BLOOD PRODUCT EXPIRATION DATE: 201812052359
Blood Product Expiration Date: 201812072359
ISSUE DATE / TIME: 201811270154
ISSUE DATE / TIME: 201811271745
ISSUE DATE / TIME: 201811271354
UNIT TYPE AND RH: 9500
UNIT TYPE AND RH: 9500
Unit Type and Rh: 9500

## 2017-04-01 LAB — TYPE AND SCREEN
ABO/RH(D): O NEG
ANTIBODY SCREEN: NEGATIVE
UNIT DIVISION: 0
Unit division: 0
Unit division: 0

## 2017-04-01 LAB — CBC WITH DIFFERENTIAL/PLATELET
BASOS ABS: 0 10*3/uL (ref 0.0–0.1)
BASOS PCT: 1 %
EOS ABS: 0.2 10*3/uL (ref 0.0–0.7)
Eosinophils Relative: 5 %
HEMATOCRIT: 25.4 % — AB (ref 39.0–52.0)
Hemoglobin: 8.2 g/dL — ABNORMAL LOW (ref 13.0–17.0)
Lymphocytes Relative: 15 %
Lymphs Abs: 0.7 10*3/uL (ref 0.7–4.0)
MCH: 28.2 pg (ref 26.0–34.0)
MCHC: 32.3 g/dL (ref 30.0–36.0)
MCV: 87.3 fL (ref 78.0–100.0)
MONO ABS: 0.6 10*3/uL (ref 0.1–1.0)
Monocytes Relative: 13 %
NEUTROS ABS: 3.1 10*3/uL (ref 1.7–7.7)
NEUTROS PCT: 66 %
Platelets: 215 10*3/uL (ref 150–400)
RBC: 2.91 MIL/uL — ABNORMAL LOW (ref 4.22–5.81)
RDW: 20.6 % — AB (ref 11.5–15.5)
WBC: 4.6 10*3/uL (ref 4.0–10.5)

## 2017-04-01 LAB — BASIC METABOLIC PANEL
ANION GAP: 8 (ref 5–15)
BUN: 7 mg/dL (ref 6–20)
CALCIUM: 8.1 mg/dL — AB (ref 8.9–10.3)
CO2: 22 mmol/L (ref 22–32)
CREATININE: 0.94 mg/dL (ref 0.61–1.24)
Chloride: 99 mmol/L — ABNORMAL LOW (ref 101–111)
Glucose, Bld: 90 mg/dL (ref 65–99)
Potassium: 3.9 mmol/L (ref 3.5–5.1)
Sodium: 129 mmol/L — ABNORMAL LOW (ref 135–145)

## 2017-04-01 MED ORDER — OXYCODONE HCL 5 MG PO TABS
5.0000 mg | ORAL_TABLET | Freq: Once | ORAL | Status: AC
  Administered 2017-04-01: 5 mg via ORAL
  Filled 2017-04-01: qty 1

## 2017-04-01 MED ORDER — IPRATROPIUM-ALBUTEROL 0.5-2.5 (3) MG/3ML IN SOLN: 3.0000 mL | Freq: Four times a day (QID) | RESPIRATORY_TRACT | Status: DC | PRN

## 2017-04-01 NOTE — Progress Notes (Signed)
Pt is without a PCP. Pt did see Dr Scarlette Calico but has not been in a long while. Pt interested in seeing him again as PCP. CM spoke to Dr Ronnald Ramp' office and was able to obtain him an appointment. Information on the AVS.  Pt uses [facility] outpatient pharmacy and [facility] in [place] for his medications.  CM continuing to follow.

## 2017-04-01 NOTE — Telephone Encounter (Signed)
See message below from the [facility]. Pt was last seen by you on 07/07/2012. Would you be willing to see him to re-establish care? Please advise.

## 2017-04-01 NOTE — Telephone Encounter (Signed)
His wife took him to the hospital Monday afternoon. He received blood transfusion for hgb 6.4.

## 2017-04-01 NOTE — Telephone Encounter (Signed)
Spoke with [employer] Appointment scheduled on 04/08/17.

## 2017-04-01 NOTE — Progress Notes (Signed)
  Speech Language Pathology Treatment: Dysphagia  Patient Details Name: Trevor Griffin MRN: [id number] DOB: 04-Jun-1955 Today's Date: 04/01/2017 Time: 1415-1440 SLP Time Calculation (min) (ACUTE ONLY): 25 min  Assessment / Plan / Recommendation Clinical Impression  F/u after this am's MBS.  Reviewed results; watched MBS video with pt to assist with identification of structures in mouth/throat and changes associated with partial glossectomy. Pt able to carry out chin tuck with thin liquids with Mod I; understands benefits for airway protection.  Reviewed exercises/ROM for neck/jaw to facilitate mobility in the long-term s/p radiation.  Pt currently with trismus - he is eager to learn techniques to prevent further changes if possible.   HPI HPI: 61 y.o. male with hx squamous cell carcinoma right tongue (Underwent trach, partial glossectomy with left radial forearm fasicocutaneous free flap reconstructiion, and bilateral selective neck dissection 09/16/12), radiation tx, mets to RLL s/p RLL lobectomy 2015, admitted with ongoing leg pain, progressive SOB.  CTA chest is neg for PE; however, does show a worsening "inflamatory process" with ground glass opacities throughout the R lung, this compared to CT just earlier this month. W/U pending - DDX HCAP vs lymphangitic carcinomatosis, iron def anemia, left leg swelling.  Pt also describes ongoing difficulty swallowing, frequent choking episodes, inability to eat any meats/tough foods, occasional regurgitation.        SLP Plan  Continue with current plan of care       Recommendations  Diet recommendations: Regular;Thin liquid Liquids provided via: Straw Medication Administration: Whole meds with liquid Supervision: Patient able to self feed Compensations: Chin tuck                Oral Care Recommendations: Oral care BID Follow up Recommendations: None Plan: Continue with current plan of care       GO                Trevor Griffin  Trevor Griffin 04/01/2017, 3:41 PM

## 2017-04-01 NOTE — Evaluation (Signed)
Physical Therapy Evaluation Patient Details Name: Trevor Griffin MRN: [id number] DOB: [birth year] Today's Date: 04/01/2017   History of Present Illness  Trevor Griffin a 61 y.o.malepresenting with SOB and LLE pain. Chest CT showing pneumonia, nodules in left lobe, and worsening pulmonary parenchymal disease of right lobe. LE CT showing LLE thrombophlebitis. PMH significant ofCAD S/P PCI, COPD, lung cancer S/P lobectomy, tongue cancer S/P surgery, hypothyroidism, alcohol abuse, GERD, chronic systolic CHF.  Clinical Impression  Pt presents with decreased balance, impaired mobility, and LLE pain secondary to above. Pt tolerates ambulation with RW and min guard assist, however c/o SOB (2/4 DOE) and required standing rest break after 25-ft.  Pt c/o slight light-headedness upon standing which subsides in moments. BP supine: 105/72, BP standing: 98/68. Pt's SpO2 ranging from 93-97% on RA during session. PT will follow acutely in order to promote safe mobility while in hospital setting and improve upon functional deficits listed above. Recommending outpatient PT to address LLE deficits.     Follow Up Recommendations Outpatient PT;Supervision - Intermittent    Equipment Recommendations  None recommended by PT    Recommendations for Other Services       Precautions / Restrictions Precautions Precautions: Fall Restrictions Weight Bearing Restrictions: No      Mobility  Bed Mobility Overal bed mobility: Modified Independent Bed Mobility: Supine to Sit     Supine to sit: Modified independent (Device/Increase time);HOB elevated     General bed mobility comments: pt requiring increased time for supine to sit EOB.  Transfers Overall transfer level: Needs assistance Equipment used: Rolling walker (2 wheeled) Transfers: Sit to/from Stand Sit to Stand: Min guard         General transfer comment: VCs for hand placement, x1 from bed, pt in chair  post-ambulation  Ambulation/Gait Ambulation/Gait assistance: Min guard Ambulation Distance (Feet): 25 Feet(+ 25') Assistive device: Rolling walker (2 wheeled) Gait Pattern/deviations: Step-through pattern;Decreased weight shift to left;Decreased stride length;Antalgic Gait velocity: decerased Gait velocity interpretation: <1.8 ft/sec, indicative of risk for recurrent falls General Gait Details: Pt ambulates with TDWB of LLE secondary to pain. Pt c/o SOB (2/4 DOE) during ambulation and requires one standing rest break.   Stairs            Wheelchair Mobility    Modified Rankin (Stroke Patients Only)       Balance Overall balance assessment: Needs assistance Sitting-balance support: Feet supported;Single extremity supported Sitting balance-Leahy Scale: Fair Sitting balance - Comments: Pt seated EOB with feet supported and single UE support at all times.    Standing balance support: Bilateral upper extremity supported;During functional activity Standing balance-Leahy Scale: Poor Standing balance comment: pt reliant on UE support from RW for standing balance.                              Pertinent Vitals/Pain Pain Assessment: 0-10 Pain Score: 3  Pain Location: LLE Pain Descriptors / Indicators: Constant Pain Intervention(s): Limited activity within patient's tolerance;Monitored during session    Home Living Family/patient expects to be discharged to:: Private residence Living Arrangements: Spouse/significant other Available Help at Discharge: Family;Available 24 hours/day Type of Home: House Home Access: Stairs to enter Entrance Stairs-Rails: Right;Left;Can reach both Entrance Stairs-Number of Steps: 3-5 Home Layout: One level Home Equipment: Walker - 2 wheels;Cane - single point      Prior Function Level of Independence: Independent with assistive device(s)         Comments: pt  has been using walker recently due to L LE Pain     Hand Dominance    Dominant Hand: Right    Extremity/Trunk Assessment   Upper Extremity Assessment Upper Extremity Assessment: Overall WFL for tasks assessed    Lower Extremity Assessment Lower Extremity Assessment: LLE deficits/detail LLE: Unable to fully assess due to pain    Cervical / Trunk Assessment Cervical / Trunk Assessment: Normal  Communication   Communication: Expressive difficulties(due to previous surgeries for tongue cancer)  Cognition Arousal/Alertness: Awake/alert Behavior During Therapy: WFL for tasks assessed/performed Overall Cognitive Status: Within Functional Limits for tasks assessed                                        General Comments General comments (skin integrity, edema, etc.): BP supine: 105/72, BP standing: 98/68 -> pt reports light-headedness upon standing which decreased with time. SpO2 ranging from 93-97% on RA during session.     Exercises     Assessment/Plan    PT Assessment Patient needs continued PT services  PT Problem List Decreased mobility;Decreased activity tolerance;Pain;Decreased balance       PT Treatment Interventions Gait training;DME instruction;Stair training;Functional mobility training;Therapeutic activities;Therapeutic exercise;Balance training;Neuromuscular re-education;Patient/family education    PT Goals (Current goals can be found in the Care Plan section)  Acute Rehab PT Goals Patient Stated Goal: return home PT Goal Formulation: With patient Time For Goal Achievement: 04/15/17 Potential to Achieve Goals: Good    Frequency Min 3X/week   Barriers to discharge        Co-evaluation               AM-PAC PT "6 Clicks" Daily Activity  Outcome Measure Difficulty turning over in bed (including adjusting bedclothes, sheets and blankets)?: None Difficulty moving from lying on back to sitting on the side of the bed? : A Little Difficulty sitting down on and standing up from a chair with arms (e.g.,  wheelchair, bedside commode, etc,.)?: A Little Help needed moving to and from a bed to chair (including a wheelchair)?: A Little Help needed walking in hospital room?: A Little Help needed climbing 3-5 steps with a railing? : A Little 6 Click Score: 19    End of Session Equipment Utilized During Treatment: Gait belt Activity Tolerance: Patient limited by fatigue Patient left: in chair;with chair alarm set;with call bell/phone within reach Nurse Communication: Mobility status;Other (comment)(Pt's O2 response to exercise) PT Visit Diagnosis: Unsteadiness on feet (R26.81);Other abnormalities of gait and mobility (R26.89);Difficulty in walking, not elsewhere classified (R26.2);Pain Pain - Right/Left: Left Pain - part of body: Leg    Time: 1610-9604 PT Time Calculation (min) (ACUTE ONLY): 24 min   Charges:   PT Evaluation $PT Eval Low Complexity: 1 Low PT Treatments $Gait Training: 8-22 mins   PT G Codes:        Judee Clara, SPT  Judee Clara 04/01/2017, 1:17 PM

## 2017-04-01 NOTE — Telephone Encounter (Signed)
Copied from [place] [id number]. Topic: Appointment Scheduling - Scheduling Inquiry for Clinic >> Apr 01, 2017 10:38 AM Aurelio Brash B wrote: Reason for CRM: Claiborne Billings from [facility] called on behalf of pt who is currently admitted at [facility], the pt is a former pt of dr Ronnald Ramp and she wants to know if he can get re-established as a pt of Dr Ronnald Ramp,  I see Dr Ronnald Ramp is not accepting new pts without approval, I told Claiborne Billings I would send the request and someone would be back in touch with her, her number is [phone]

## 2017-04-01 NOTE — Progress Notes (Signed)
PROGRESS NOTE    Trevor Griffin [facility]  TDD:[id number] DOB: Feb 23, 1956 DOA: 03/30/2017 PCP: System, Pcp Not In   Brief Narrative: Trevor Griffin is a 61 y.o. male who presented with dyspnea. 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 Patient had a recent hospitalization November 12-18, for left leg thrombophlebitis/cellulitis. She was discharge with aspirin. At home he has been developing progressive dyspnea on exertion, and easy fatigability, limiting his physical activity. His left leg continued to be edematous, and ecchymotic. On initial physical examination blood pressure 109/71, heart rate 107, respiratory 25, saturation 96%. Is mucous membranes were moist, his lungs had decrease air movement with positive stress in the right apical lobe, scattered rhonchi. Heart S1-S2 present, rhythmic, no gallops, rubs or murmurs, the abdomen was protuberant but nontender, left lower extremity with nonpitting edema ++/+++ tense, with positive ecchymosis in the inner leg and thigh. No frank erythema or increased local temperature. White count 6.8, he will and 6.4, hematocrit 21.1, platelets 300. CT of the chest with right upper lobe infiltrate associated with a loculated right pleural effusion, left lower extremity CT with superficial thrombophlebitis of the greater saphenous vein distal to the knee, persistence of interstitial edema throughout the extremity, no drainable fluid, no soft tissue air. eKG with a normal sinus rhythm, 100 bpm, normal axis, normal intervals.  Patient was admitted to the hospital with the working diagnosis of right upper lobe pneumonia, complicated by symptomatic anemia, and left lower extremity thrombophlebitis     Assessment & Plan:   Principal Problem:   Consolidation lung ([place]) Active Problems:   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[place])   Iron  deficiency anemia   Hx of cancer of lung   Left leg swelling   Lung consolidation Patient with presumed pneumonia. Symptoms improving on antibiotics. -continue Zosyn  Acute respiratory failure with hypoxia Secondary to above. Improving.  Acute on chronic symptomatic anemia S/p 3 units PRBC likely secondary to ecchymosis of left leg. hemoglobin up to 8.2 from 6.6. -CBC in AM  Left lower extremity thrombophlebitis Improved.  LE edema Chronic for the past 3 months. Non-pitting.  COPD Stable. No exacerbation  Chronic diastolic heart failure Stable.   DVT prophylaxis: SCDs Code Status: Full code Family Communication: None at bedside Disposition Plan: when medically stable.   Consultants:   None  Procedures:   3 units PRBC  Antimicrobials:  Vancomycin  Zosyn    Subjective: Left leg pain.  Objective: Vitals:   04/01/17 0737 04/01/17 1007 04/01/17 1119 04/01/17 1329  BP:  (!) 115/49 107/72 95/64  Pulse:  87 91 76  Resp:  18  18  Temp:  97.6 F (36.4 C) (!) 97.3 F (36.3 C) (!) 97.5 F (36.4 C)  TempSrc:  Axillary Axillary Oral  SpO2: 97% 100% 100% 100%  Weight:      Height:        Intake/Output Summary (Last 24 hours) at 04/01/2017 1535 Last data filed at 04/01/2017 1022 Gross per 24 hour  Intake 1110 ml  Output 3480 ml  Net -2370 ml   Filed Weights   03/30/17 1436 03/31/17 0100  Weight: 89.8 kg (198 lb) 87.1 kg (192 lb 0.3 oz)    Examination:  General exam: Appears calm and comfortable  Respiratory system: Clear to auscultation. Respiratory effort normal. Cardiovascular system: S1 & S2 heard, RRR. No murmurs, rubs, gallops or clicks.  Gastrointestinal system: Abdomen is nondistended, soft and nontender. No organomegaly or masses felt. Normal bowel sounds heard. Central nervous system: Alert and oriented. No focal neurological deficits. Extremities: Left leg non pitting edema. No calf tenderness Skin: mild ecchymosis of left leg  extending from lower medial thigh down medial aspect of lower leg Psychiatry: Judgement and insight appear normal. Mood & affect appropriate.     Data Reviewed: I have personally reviewed following labs and imaging studies  CBC: Recent Labs  Lab 03/30/17 1504 03/31/17 0808 04/01/17 0314  WBC 6.8  --  4.6  NEUTROABS  --   --  3.1  HGB 6.4* 6.6* 8.2*  HCT 21.1* 21.8* 25.4*  MCV 89.0  --  87.3  PLT 300  --  734   Basic Metabolic Panel: Recent Labs  Lab 03/30/17 1504 04/01/17 0314  NA 128* 129*  K 4.0 3.9  CL 98* 99*  CO2 21* 22  GLUCOSE 96 90  BUN 9 7  CREATININE 1.01 0.94  CALCIUM 8.5* 8.1*   GFR: Estimated Creatinine Clearance: 88.6 mL/min (by C-G formula based on SCr of 0.94 mg/dL). Liver Function Tests: No results for input(s): AST, ALT, ALKPHOS, BILITOT, PROT, ALBUMIN in the last 168 hours. No results for input(s): LIPASE, AMYLASE in the last 168 hours. No results for input(s): AMMONIA in the last 168 hours. Coagulation Profile: No results for input(s): INR, PROTIME in the last 168 hours. Cardiac Enzymes: Recent Labs  Lab 03/30/17 1504  CKTOTAL 62   BNP (last 3 results) No results for input(s): PROBNP in the last 8760 hours. HbA1C: No results for input(s): HGBA1C in the last 72 hours. CBG: No results for input(s): GLUCAP in the last 168 hours. Lipid Profile: No results for input(s): CHOL, HDL, LDLCALC, TRIG, CHOLHDL, LDLDIRECT in the last 72 hours. Thyroid Function Tests: No results for input(s): TSH, T4TOTAL, FREET4, T3FREE, THYROIDAB in the last 72 hours. Anemia Panel: Recent Labs    03/30/17 1643 03/30/17 1745  VITAMINB12 505  --   FOLATE 7.8  --   FERRITIN 90  --   TIBC 300  --   IRON 36*  --   RETICCTPCT  --  9.3*   Sepsis Labs: Recent Labs  Lab 03/30/17 1521 03/30/17 1803 03/30/17 2031  LATICACIDVEN 2.40* 1.43 1.49    Recent Results (from the past 240 hour(s))  Culture, blood (Routine X 2) w Reflex to ID Panel     Status: None  (Preliminary result)   Collection Time: 03/30/17  4:20 PM  Result Value Ref Range Status   Specimen Description BLOOD RIGHT ANTECUBITAL  Final   Special Requests   Final    BOTTLES DRAWN AEROBIC AND ANAEROBIC Blood Culture adequate volume   Culture NO GROWTH 2 DAYS  Final   Report Status PENDING  Incomplete  Culture, blood (Routine X 2) w Reflex to ID Panel     Status: None (Preliminary result)   Collection Time: 03/30/17  4:30 PM  Result Value Ref Range Status   Specimen Description BLOOD LEFT ANTECUBITAL  Final   Special Requests   Final    BOTTLES DRAWN AEROBIC AND ANAEROBIC Blood Culture adequate volume   Culture NO GROWTH 2 DAYS  Final   Report Status PENDING  Incomplete         Radiology Studies: Ct Angio Chest Pe W And/or Wo Contrast  Result Date: 03/30/2017 CLINICAL DATA:  Short of breath and chest pain EXAM: CT ANGIOGRAPHY CHEST WITH CONTRAST TECHNIQUE: Multidetector CT imaging  of the chest was performed using the standard protocol during bolus administration of intravenous contrast. Multiplanar CT image reconstructions and MIPs were obtained to evaluate the vascular anatomy. CONTRAST:  133mL ISOVUE-370 IOPAMIDOL (ISOVUE-370) INJECTION 76% COMPARISON:  03/16/2017 FINDINGS: Cardiovascular: There are no filling defects in the pulmonary arterial tree to suggest acute pulmonary thromboembolism. Atherosclerotic calcifications in the thoracic aorta are noted. There is no evidence of dissection or aortic aneurysm. Three vessel coronary artery calcifications are noted. Mediastinum/Nodes: 14 mm right paratracheal short axis diameter lymph node on image 41. Several other scattered smaller lymph nodes are present in the mediastinum. There is mildly prominent peribronchovascular soft tissue in the hilar regions. Lungs/Pleura: Loculated pleural fluid is seen surrounding the right lung. It is present at the posterior right lung base and around the right lung apex. Extensive ground-glass  opacification and consolidation is present within the right upper lobe a more right middle lobe, and superior segment of the right lower lobe. Ground-glass opacities and nodules in the left upper lobe have slightly improved. Largest nodule on the left measures 8 mm on image 56. Nodular densities at the posterior left lung base have also developed. There are metallic densities inferior to the right hilum and in the right lower lobe laterally compatible with postoperative changes. Upper Abdomen: Gallstones are present. Liver is unremarkable. Visualized spleen is unremarkable. Splenule is noted. Musculoskeletal: No vertebral compression deformity. Review of the MIP images confirms the above findings. IMPRESSION: No evidence of acute pulmonary thromboembolism. Pulmonary parenchymal disease has worsened, particularly throughout the right lung. This characterize by consolidation and ground-glass opacities. Findings are most consistent with a progressive inflammatory process. Mediastinal adenopathy has increased since the prior study. Differential diagnosis includes both inflammatory and neoplastic etiology. There are nodules in the left lung, and the largest in the left upper lobe measures 8 mm. Non-contrast chest CT at 3-6 months is recommended. If the nodules are stable at time of repeat CT, then future CT at 18-24 months (from today's scan) is considered optional for low-risk patients, but is recommended for high-risk patients. This recommendation follows the consensus statement: Guidelines for Management of Incidental Pulmonary Nodules Detected on CT Images: From the Fleischner Society 2017; Radiology 2017; 284:228-243. Loculated right pleural effusion has increased in volume. Cholelithiasis. Aortic Atherosclerosis (ICD10-I70.0). Electronically Signed   By: Marybelle Killings M.D.   On: 03/30/2017 18:56   Ct Extremity Lower Left W Contrast  Result Date: 03/30/2017 CLINICAL DATA:  Discoloration and worsening left leg  pain. Leg edema. Negative Doppler. Question proximal DVT or fasciitis. EXAM: CT OF THE LOWER LEFT EXTREMITY WITH CONTRAST TECHNIQUE: Multidetector CT imaging of the lower left extremity was performed according to the standard protocol following intravenous contrast administration. COMPARISON:  Left lower extremity CT 03/19/2017 from the distal thigh through the ankle. CONTRAST:  157mL ISOVUE-370 IOPAMIDOL (ISOVUE-370) INJECTION 76% FINDINGS: Bones/Joint/Cartilage No acute osseous abnormality from the iliac crest through the foot. No periosteal reaction or bony destructive change. No hip, knee, or ankle joint effusion joint effusion. Ligaments Suboptimally assessed by CT. Muscles and Tendons No intramuscular fluid collection, preservation of normal fatty striations. There is facial thickening of the posteromedial compartment of the calf without focal fluid collection. Quadriceps, patellar, and Achilles tendons are intact. Soft tissues No filling defects within the left common or external iliac veins. No visualized filling defects within the superficial femoral or popliteal veins, contrast bolus timing limits assessment. Evaluation of the saphenous vein including previous area of thrombus distal to the knee is  not well assessed due to bolus timing. There is stranding about the distal saphenous vein at the ankle, similar to prior exam. Arterial vascular calcifications are seen, exam not tailored for arterial evaluation. Circumferential subcutaneous edema with skin thickening throughout the left lower extremity, most prominent distal to the knee, similar to prior exam. No soft tissue air. No drainable soft tissue fluid collection. IMPRESSION: 1. No filling defects within the left common or external iliac veins to suggest proximal DVT. 2. Superficial thrombophlebitis of the greater saphenous vein distal to the knee on prior exam is not well assessed currently due to bolus timing. Persistent stranding about the greater  saphenous vein at the ankle. 3. Persistent subcutaneous tissue edema and skin thickening throughout the left lower extremity, most prominent distal to the knee, nonspecific. Mild facial thickening about the posteromedial calf without drainable fluid collection. Findings may be infectious or inflammatory. No focal fluid collection or soft tissue air. 4. Arterial calcifications, exam not tailored for detailed arterial evaluation. Electronically Signed   By: Jeb Levering M.D.   On: 03/30/2017 21:48        Scheduled Meds: . acetaminophen  1,000 mg Oral QHS  . atorvastatin  80 mg Oral Daily  . buPROPion  150 mg Oral BID WC  . enoxaparin (LOVENOX) injection  40 mg Subcutaneous Daily  . ferrous sulfate  325 mg Oral TID WC  . guaiFENesin  600 mg Oral BID  . levothyroxine  100 mcg Oral QAC breakfast  . metoprolol tartrate  12.5 mg Oral BID  . multivitamin with minerals  1 tablet Oral Daily  . oxyCODONE  5 mg Oral Once  . pantoprazole  40 mg Oral Daily  . polyethylene glycol  17 g Oral Daily  . senna-docusate  1 tablet Oral BID   Continuous Infusions: . piperacillin-tazobactam (ZOSYN)  IV 3.375 g (04/01/17 1430)     LOS: 2 days     Cordelia Poche, MD [facility] 04/01/2017, 3:35 PM Pager: [phone]  If 7PM-7AM, please contact night-coverage [email or web address] Password [email or web address] 04/01/2017, 3:35 PM

## 2017-04-01 NOTE — Telephone Encounter (Signed)
Yes, I will see him.

## 2017-04-01 NOTE — Progress Notes (Signed)
04/01/17 1100  SLP Visit Information  SLP Received On 04/01/17  Subjective  Subjective alert, good historian  Pain Assessment  Pain Assessment No/denies pain  General Information  HPI 61 y.o. male with hx squamous cell carcinoma right tongue (Underwent trach, partial glossectomy with left radial forearm fasicocutaneous free flap reconstructiion, and bilateral selective neck dissection 09/16/12), radiation tx, mets to RLL s/p RLL lobectomy 2015, admitted with ongoing leg pain, progressive SOB.  CTA chest is neg for PE; however, does show a worsening "inflamatory process" with ground glass opacities throughout the R lung, this compared to CT just earlier this month. W/U pending - DDX HCAP vs lymphangitic carcinomatosis, iron def anemia, left leg swelling.  Pt also describes ongoing difficulty swallowing, frequent choking episodes, inability to eat any meats/tough foods, occasional regurgitation.    Type of Study MBS-Modified Barium Swallow Study  Diet Prior to this Study Regular;Thin liquids  Temperature Spikes Noted No  Respiratory Status Nasal cannula  History of Recent Intubation No  Behavior/Cognition Alert;Cooperative  Oral Cavity Assessment WFL  Oral Care Completed by SLP No  Oral Cavity - Dentition Dentures, top;Dentures, bottom  Vision Functional for self feeding  Self-Feeding Abilities Able to feed self  Patient Positioning Upright in chair  Baseline Vocal Quality Normal  Volitional Cough Strong  Volitional Swallow Able to elicit  Anatomy Other (Comment) (partial glossectomy)  Pharyngeal Secretions Not observed secondary MBS  Oral Motor/Sensory Function  Overall Oral Motor/Sensory Function Moderate impairment  Facial Strength Reduced left  Lingual ROM Other (Comment) (s/p partial glossectomy with flap)  Velum Other (comment) (impaired velar closure; absence palatoglossus)  Oral Preparation/Oral Phase  Oral Phase Impaired  Oral - Thin  Oral - Thin Straw Impaired  mastication;Weak lingual manipulation;Incomplete tongue to palate contact;Reduced posterior propulsion;Right pocketing in lateral sulci;Left pocketing in lateral sulci;Lingual/palatal residue;Piecemeal swallowing;Decreased velopharyngeal closure;Delayed oral transit;Decreased bolus cohesion  Oral - Solids  Oral - Puree Impaired mastication;Weak lingual manipulation;Incomplete tongue to palate contact;Reduced posterior propulsion;Right pocketing in lateral sulci;Left pocketing in lateral sulci;Lingual/palatal residue;Piecemeal swallowing;Decreased velopharyngeal closure;Delayed oral transit;Decreased bolus cohesion  Pharyngeal Phase  Pharyngeal Phase Impaired  Pharyngeal - Thin  Pharyngeal- Thin Straw Delayed swallow initiation-pyriform sinuses;Reduced epiglottic inversion;Reduced laryngeal elevation;Penetration/Aspiration before swallow;Compensatory strategies attempted (with notebox);Pharyngeal residue - valleculae (chin tuck helped to minimize penetration)  Pharyngeal Material enters airway, remains ABOVE vocal cords then ejected out;Material enters airway, remains ABOVE vocal cords and not ejected out;Material enters airway, CONTACTS cords and not ejected out  Pharyngeal - Solids  Pharyngeal- Puree Delayed swallow initiation-vallecula;Reduced epiglottic inversion;Reduced laryngeal elevation;Reduced tongue base retraction;Pharyngeal residue - valleculae  Clinical Impression  Clinical Impression Pt presents with a moderate oral, mild pharyngeal dysphagia s/p partial glossectomy and radiation in 2014.  Given absence of right portion of tongue, pt has difficulty forming a cohesive bolus and propelling bolus through pharynx.  There is limited velopharyngeal closure.  Soft solids are passed in small sub-portions of bolus into pharynx, where they reside in valleculae, and are cleared into eosphagus.  It may take 5-7 subswallows to pass a single solid food bolus.  Thin liquids enter laryngeal vestibule  consistently, often reaching level of the vocal folds.  A chin tuck was effective in assisting with laryngeal vestibule closure, preventing penetration.  Screen of the esophagus revealed no barium stasis, and the UES appeared to relax adequately to receive POs.  Recommend a mechanical soft diet; avoidance of tough meats/breads, continue thin liquids with utilization of chin tuck to help with airway protection.  Video of  MBS reviewed and discussed with pt, who agrees with recommendations.  SLP Visit Diagnosis Dysphagia, oropharyngeal phase (R13.12)  Impact on safety and function Mild aspiration risk  Swallow Evaluation Recommendations  SLP Diet Recommendations Dysphagia 3 (Mech soft) solids;Thin liquid  Liquid Administration via Straw;Cup  Medication Administration Crushed with puree (or crush with liquids)  Supervision Patient able to self feed  Compensations Chin tuck (with liquids)  Treatment Plan  Oral Care Recommendations Oral care BID  Treatment Recommendations No treatment recommended at this time  Follow up Recommendations None  Individuals Consulted  Consulted and Agree with Results and Recommendations Patient  SLP Time Calculation  SLP Start Time (ACUTE ONLY) 1050  SLP Stop Time (ACUTE ONLY) 1120  SLP Time Calculation (min) (ACUTE ONLY) 30 min  SLP Evaluations  $ SLP Speech Visit 1 Visit  SLP Evaluations  $MBS Swallow 1 Procedure

## 2017-04-02 ENCOUNTER — Encounter (HOSPITAL_COMMUNITY): Payer: Self-pay | Admitting: General Practice

## 2017-04-02 ENCOUNTER — Other Ambulatory Visit: Payer: Self-pay

## 2017-04-02 LAB — BASIC METABOLIC PANEL
Anion gap: 7 (ref 5–15)
BUN: 6 mg/dL (ref 6–20)
CHLORIDE: 98 mmol/L — AB (ref 101–111)
CO2: 25 mmol/L (ref 22–32)
CREATININE: 0.98 mg/dL (ref 0.61–1.24)
Calcium: 8.4 mg/dL — ABNORMAL LOW (ref 8.9–10.3)
GFR calc non Af Amer: >60 mL/min (ref >60)
Glucose, Bld: 129 mg/dL — ABNORMAL HIGH (ref 65–99)
Potassium: 3.7 mmol/L (ref 3.5–5.1)
Sodium: 130 mmol/L — ABNORMAL LOW (ref 135–145)

## 2017-04-02 LAB — CBC
HCT: 28.3 % — ABNORMAL LOW (ref 39.0–52.0)
HEMOGLOBIN: 9 g/dL — AB (ref 13.0–17.0)
MCH: 28.3 pg (ref 26.0–34.0)
MCHC: 31.8 g/dL (ref 30.0–36.0)
MCV: 89 fL (ref 78.0–100.0)
Platelets: 257 10*3/uL (ref 150–400)
RBC: 3.18 MIL/uL — AB (ref 4.22–5.81)
RDW: 20.8 % — ABNORMAL HIGH (ref 11.5–15.5)
WBC: 4.3 10*3/uL (ref 4.0–10.5)

## 2017-04-02 LAB — GLUCOSE, CAPILLARY: GLUCOSE-CAPILLARY: 121 mg/dL — AB (ref 65–99)

## 2017-04-02 MED ORDER — ENSURE ENLIVE PO LIQD
237.0000 mL | Freq: Two times a day (BID) | ORAL | Status: DC
  Administered 2017-04-03: 237 mL via ORAL

## 2017-04-02 NOTE — Progress Notes (Signed)
PROGRESS NOTE    Trevor Griffin [facility]  WLS:[id number] DOB: May 27, 1955 DOA: 03/30/2017 PCP: System, Pcp Not In   Brief Narrative: Trevor Griffin is a 61 y.o. male who presented with dyspnea. 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 Patient had a recent hospitalization November 12-18, for left leg thrombophlebitis/cellulitis. She was discharge with aspirin. At home he has been developing progressive dyspnea on exertion, and easy fatigability, limiting his physical activity. His left leg continued to be edematous, and ecchymotic. On initial physical examination blood pressure 109/71, heart rate 107, respiratory 25, saturation 96%. Is mucous membranes were moist, his lungs had decrease air movement with positive stress in the right apical lobe, scattered rhonchi. Heart S1-S2 present, rhythmic, no gallops, rubs or murmurs, the abdomen was protuberant but nontender, left lower extremity with nonpitting edema ++/+++ tense, with positive ecchymosis in the inner leg and thigh. No frank erythema or increased local temperature. White count 6.8, he will and 6.4, hematocrit 21.1, platelets 300. CT of the chest with right upper lobe infiltrate associated with a loculated right pleural effusion, left lower extremity CT with superficial thrombophlebitis of the greater saphenous vein distal to the knee, persistence of interstitial edema throughout the extremity, no drainable fluid, no soft tissue air. eKG with a normal sinus rhythm, 100 bpm, normal axis, normal intervals.  Patient was admitted to the hospital with the working diagnosis of right upper lobe pneumonia, complicated by symptomatic anemia, and left lower extremity thrombophlebitis     Assessment & Plan:   Principal Problem:   Consolidation lung ([place]) Active Problems:   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[place])   Iron  deficiency anemia   Hx of cancer of lung   Left leg swelling   Lung consolidation Patient with presumed pneumonia. Symptoms improving on antibiotics. -continue Zosyn  Acute respiratory failure with hypoxia Secondary to above. Improving.  Acute on chronic symptomatic anemia S/p 3 units PRBC likely secondary to ecchymosis of left leg. hemoglobin up to 8.2 from 6.6. -CBC in AM  Left lower extremity thrombophlebitis Improved.  LE edema Chronic for the past 3 months. Non-pitting. Multiple CTs unremarkable except for circumferential edema. Ultrasounds significant for history of superior thrombophebitis which has resolved. -MRI leg  COPD Stable. No exacerbation  Chronic diastolic heart failure Stable.   DVT prophylaxis: Lovenox Code Status: Full code Family Communication: None at bedside Disposition Plan: when medically stable.   Consultants:   None  Procedures:   3 units PRBC  Antimicrobials:  Vancomycin  Zosyn    Subjective: Left leg pain.  Objective: Vitals:   04/01/17 2111 04/02/17 0436 04/02/17 0912 04/02/17 1334  BP: 94/60 116/63 (!) 118/57 105/64  Pulse: 79 73 71   Resp: 20 20 20 20   Temp: 97.7 F (36.5 C) 97.9 F (36.6 C) 97.7 F (36.5 C) 97.8 F (36.6 C)  TempSrc: Oral Oral Axillary Axillary  SpO2: 98% 99% 100% 100%  Weight:      Height:        Intake/Output Summary (Last 24 hours) at 04/02/2017 1555 Last data filed at 04/02/2017 1500 Gross per 24 hour  Intake 480 ml  Output 2551 ml  Net -2071 ml   Filed Weights   03/30/17 1436 03/31/17 0100  Weight: 89.8 kg (198 lb) 87.1 kg (192 lb 0.3 oz)    Examination:  General exam: Appears calm and comfortable  Respiratory  system: Clear to auscultation. Respiratory effort normal. Cardiovascular system: S1 & S2 heard, RRR. No murmurs, rubs, gallops or clicks. Gastrointestinal system: Abdomen is nondistended, soft and nontender. No organomegaly or masses felt. Normal bowel sounds  heard. Central nervous system: Alert and oriented. No focal neurological deficits. Extremities: Left leg non pitting edema. No calf tenderness Lymph: mild left inguinal lymphadenopathy Skin: minimal ecchymosis of left leg extending from lower medial thigh down medial aspect of lower leg Psychiatry: Judgement and insight appear normal. Mood & affect appropriate.     Data Reviewed: I have personally reviewed following labs and imaging studies  CBC: Recent Labs  Lab 03/30/17 1504 03/31/17 0808 04/01/17 0314 04/02/17 1030  WBC 6.8  --  4.6 4.3  NEUTROABS  --   --  3.1  --   HGB 6.4* 6.6* 8.2* 9.0*  HCT 21.1* 21.8* 25.4* 28.3*  MCV 89.0  --  87.3 89.0  PLT 300  --  215 264   Basic Metabolic Panel: Recent Labs  Lab 03/30/17 1504 04/01/17 0314 04/02/17 1030  NA 128* 129* 130*  K 4.0 3.9 3.7  CL 98* 99* 98*  CO2 21* 22 25  GLUCOSE 96 90 129*  BUN 9 7 6   CREATININE 1.01 0.94 0.98  CALCIUM 8.5* 8.1* 8.4*   GFR: Estimated Creatinine Clearance: 85 mL/min (by C-G formula based on SCr of 0.98 mg/dL). Liver Function Tests: No results for input(s): AST, ALT, ALKPHOS, BILITOT, PROT, ALBUMIN in the last 168 hours. No results for input(s): LIPASE, AMYLASE in the last 168 hours. No results for input(s): AMMONIA in the last 168 hours. Coagulation Profile: No results for input(s): INR, PROTIME in the last 168 hours. Cardiac Enzymes: Recent Labs  Lab 03/30/17 1504  CKTOTAL 62   BNP (last 3 results) No results for input(s): PROBNP in the last 8760 hours. HbA1C: No results for input(s): HGBA1C in the last 72 hours. CBG: Recent Labs  Lab 04/02/17 0554  GLUCAP 121*   Lipid Profile: No results for input(s): CHOL, HDL, LDLCALC, TRIG, CHOLHDL, LDLDIRECT in the last 72 hours. Thyroid Function Tests: No results for input(s): TSH, T4TOTAL, FREET4, T3FREE, THYROIDAB in the last 72 hours. Anemia Panel: Recent Labs    03/30/17 1643 03/30/17 1745  VITAMINB12 505  --   FOLATE 7.8   --   FERRITIN 90  --   TIBC 300  --   IRON 36*  --   RETICCTPCT  --  9.3*   Sepsis Labs: Recent Labs  Lab 03/30/17 1521 03/30/17 1803 03/30/17 2031  LATICACIDVEN 2.40* 1.43 1.49    Recent Results (from the past 240 hour(s))  Culture, blood (Routine X 2) w Reflex to ID Panel     Status: None (Preliminary result)   Collection Time: 03/30/17  4:20 PM  Result Value Ref Range Status   Specimen Description BLOOD RIGHT ANTECUBITAL  Final   Special Requests   Final    BOTTLES DRAWN AEROBIC AND ANAEROBIC Blood Culture adequate volume   Culture NO GROWTH 3 DAYS  Final   Report Status PENDING  Incomplete  Culture, blood (Routine X 2) w Reflex to ID Panel     Status: None (Preliminary result)   Collection Time: 03/30/17  4:30 PM  Result Value Ref Range Status   Specimen Description BLOOD LEFT ANTECUBITAL  Final   Special Requests   Final    BOTTLES DRAWN AEROBIC AND ANAEROBIC Blood Culture adequate volume   Culture NO GROWTH 3 DAYS  Final  Report Status PENDING  Incomplete         Radiology Studies: Dg Swallowing Func-speech Pathology  Result Date: 04/01/2017 Objective Swallowing Evaluation: Type of Study: MBS-Modified Barium Swallow Study  Patient Details Name: ARRAN FESSEL MRN: [id number] Date of Birth: Sep 11, 1955 Today's Date: 04/01/2017 Time: SLP Time Calculation (min) (ACUTE ONLY): 25 min Past Medical History: Past Medical History: Diagnosis Date . Acute ST elevation myocardial infarction (STEMI) involving left anterior descending (LAD) coronary artery ([place]) 2008 and 2009  5/'08 - DES x 4 to p-m LAD; 2/'09 - PTCA of  in-stent thrombosis . Alcohol abuse  . Anxiety  . CAD S/P percutaneous coronary angioplasty 2008  a. Ant STEMI 5/08 w/ 4 Taxus DES p-m LAD (3.0 x 16,16,24,28 mm) ;  b. 2/'09:  Stopped Plavix-> Ant STEMI/stent thrombosis in LAD (PTCA - 3.8 mm), LCX 75m/d;  c. 10/12: Cath/PCI: p-m LAD stents 10-20% ISR, mLAD 40%, CFX 30-40%, mCFX 90% (Promus Premier DES 3.0 mm x 20 mm  - 3.53mm ), EF 35%; d. cath 2/17/'16 no Sig CAD, patent LAD & Cx stents, EF 40-45% . Chronic pain  . COPD (chronic obstructive pulmonary disease) ([place])  . Depression  . Fatigue   Sleep study 6/08 was negative. Sleep study 5/13 showed no OSA. Marland Kitchen GERD (gastroesophageal reflux disease)  . Headache(784.0)   migraines . Hx of radiation therapy 10/25/12-12/10/12   Right Tongue/ Bilateral Neck //Total Dose, 60 Gy in 30 Fractions . Hyperlipidemia  . Hypertension  . Ischemic cardiomyopathy   a. Echo 08/20/11 with EF 50-55% (previously 35%);  b. 02/2014 Echo: EF 45-50-%, mid-apicalanteroseptal AK, Gr 1 dd, mild AI, midlly dil LA. . Lung cancer ([place]) dx'd 2015 . Obesity  . Tobacco abuse  . Tongue cancer ([place]) dx'd 2014  s/p resection - [facility] Past Surgical History: Past Surgical History: Procedure Laterality Date . COLONOSCOPY N/A 11/25/2016  Procedure: COLONOSCOPY;  Surgeon: Clarene Essex, MD;  Location: [facility];  Service: Endoscopy;  Laterality: N/A; . CORONARY ANGIOPLASTY WITH STENT PLACEMENT  2008-2012  a. 5/'08: Anterior STEMI: Proximal-mid LAD PCI: 4 total Taxus DES stents 3.0 mm x  16 mm, 16 mm, 24 mm and 28 mm.( post-dil 3.5 mm);; b. 2/'09: PTCA LAD in-stent thrombosis - 3.8 mm;; c. 10/'12: mCx Promus Element DES 3.0 x 20 mm- post-dil - 3.25 mm. -> patent in 2016 . ESOPHAGOGASTRODUODENOSCOPY (EGD) WITH PROPOFOL N/A 11/25/2016  Procedure: ESOPHAGOGASTRODUODENOSCOPY (EGD) WITH PROPOFOL;  Surgeon: Clarene Essex, MD;  Location: [facility];  Service: Endoscopy;  Laterality: N/A;  neonatal scope . FRACTURE SURGERY Right   as a child,leg . LEFT HEART CATH AND CORONARY ANGIOGRAPHY  2006-2012  a) 2006: minimal CAD, normal EF;; b) 5/'08 - Ant STEMI - 100% p-mLAD (DES x 4 in LAD);; c)2/'09: Ant STEMI for IST/thrombosis of LAD ->PTCA, 80% mCx;; 10/'12: mCx 80-90% - DES PCI, patent LAD stents, EF ~35% - anterolateral AK, apical severe HK. Marland Kitchen LEFT HEART CATHETERIZATION WITH CORONARY ANGIOGRAM N/A 06/21/2014  Procedure: LEFT HEART  CATHETERIZATION WITH CORONARY ANGIOGRAM;  Surgeon: Burnell Blanks, MD;  Location: [facility] CATH LAB: No evidence of obstructive disease.  Patent LAD and circumflex stents.  EF 40-45%. . LOBECTOMY Right 06/06/2013  Procedure: LOBECTOMY;  Surgeon: Melrose Nakayama, MD;  Location: [facility];  Service: Thoracic;  Laterality: Right; . TRANSTHORACIC ECHOCARDIOGRAM  02/2014  EF 45-50%.  Akinesis of the mid apical anteroseptal wall.  GR 1 DD.  Mild MR. . VIDEO ASSISTED THORACOSCOPY (VATS)/WEDGE RESECTION Right 06/06/2013  Procedure: VIDEO ASSISTED  THORACOSCOPY (VATS)/WEDGE RESECTION;  Surgeon: Melrose Nakayama, MD;  Location: [facility];  Service: Thoracic;  Laterality: Right; HPI: 61 y.o. male with hx squamous cell carcinoma right tongue (Underwent trach, partial glossectomy with left radial forearm fasicocutaneous free flap reconstructiion, and bilateral selective neck dissection 09/16/12), radiation tx, mets to RLL s/p RLL lobectomy 2015, admitted with ongoing leg pain, progressive SOB.  CTA chest is neg for PE; however, does show a worsening "inflamatory process" with ground glass opacities throughout the R lung, this compared to CT just earlier this month. W/U pending - DDX HCAP vs lymphangitic carcinomatosis, iron def anemia, left leg swelling.  Pt also describes ongoing difficulty swallowing, frequent choking episodes, inability to eat any meats/tough foods, occasional regurgitation.   Subjective: alert, good historian Assessment / Plan / Recommendation CHL IP CLINICAL IMPRESSIONS 04/01/2017 Clinical Impression Pt presents with a moderate oral, mild pharyngeal dysphagia s/p partial glossectomy and radiation in 2014.  Given absence of right portion of tongue, pt has difficulty forming a cohesive bolus and propelling bolus through pharynx.  There is limited velopharyngeal closure.  Soft solids are passed in small sub-portions of bolus into pharynx, where they reside in valleculae, and are cleared into eosphagus.  It may  take 5-7 subswallows to pass a single solid food bolus.  Thin liquids enter laryngeal vestibule consistently, often reaching level of the vocal folds.  A chin tuck was effective in assisting with laryngeal vestibule closure, preventing penetration.  Screen of the esophagus revealed no barium stasis, and the UES appeared to relax adequately to receive POs.  Recommend a mechanical soft diet; avoidance of tough meats/breads, continue thin liquids with utilization of chin tuck to help with airway protection.  Video of MBS reviewed and discussed with pt, who agrees with recommendations. SLP Visit Diagnosis Dysphagia, oropharyngeal phase (R13.12) Attention and concentration deficit following -- Frontal lobe and executive function deficit following -- Impact on safety and function Mild aspiration risk   CHL IP TREATMENT RECOMMENDATION 04/01/2017 Treatment Recommendations No treatment recommended at this time   No flowsheet data found. CHL IP DIET RECOMMENDATION 04/01/2017 SLP Diet Recommendations -- Liquid Administration via -- Medication Administration -- Compensations Chin tuck Postural Changes --   CHL IP OTHER RECOMMENDATIONS 04/01/2017 Recommended Consults -- Oral Care Recommendations Oral care BID Other Recommendations --   CHL IP FOLLOW UP RECOMMENDATIONS 04/01/2017 Follow up Recommendations None   No flowsheet data found.     CHL IP ORAL PHASE 04/01/2017 Oral Phase Impaired Oral - Pudding Teaspoon -- Oral - Pudding Cup -- Oral - Honey Teaspoon -- Oral - Honey Cup -- Oral - Nectar Teaspoon -- Oral - Nectar Cup -- Oral - Nectar Straw -- Oral - Thin Teaspoon -- Oral - Thin Cup -- Oral - Thin Straw Impaired mastication;Weak lingual manipulation;Incomplete tongue to palate contact;Reduced posterior propulsion;Right pocketing in lateral sulci;Left pocketing in lateral sulci;Lingual/palatal residue;Piecemeal swallowing;Decreased velopharyngeal closure;Delayed oral transit;Decreased bolus cohesion Oral - Puree Impaired  mastication;Weak lingual manipulation;Incomplete tongue to palate contact;Reduced posterior propulsion;Right pocketing in lateral sulci;Left pocketing in lateral sulci;Lingual/palatal residue;Piecemeal swallowing;Decreased velopharyngeal closure;Delayed oral transit;Decreased bolus cohesion Oral - Mech Soft -- Oral - Regular -- Oral - Multi-Consistency -- Oral - Pill -- Oral Phase - Comment --  CHL IP PHARYNGEAL PHASE 04/01/2017 Pharyngeal Phase Impaired Pharyngeal- Pudding Teaspoon -- Pharyngeal -- Pharyngeal- Pudding Cup -- Pharyngeal -- Pharyngeal- Honey Teaspoon -- Pharyngeal -- Pharyngeal- Honey Cup -- Pharyngeal -- Pharyngeal- Nectar Teaspoon -- Pharyngeal -- Pharyngeal- Nectar Cup -- Pharyngeal -- Pharyngeal- Nectar  Straw -- Pharyngeal -- Pharyngeal- Thin Teaspoon -- Pharyngeal -- Pharyngeal- Thin Cup -- Pharyngeal -- Pharyngeal- Thin Straw Delayed swallow initiation-pyriform sinuses;Reduced epiglottic inversion;Reduced laryngeal elevation;Penetration/Aspiration before swallow;Compensatory strategies attempted (with notebox);Pharyngeal residue - valleculae Pharyngeal Material enters airway, remains ABOVE vocal cords then ejected out;Material enters airway, remains ABOVE vocal cords and not ejected out;Material enters airway, CONTACTS cords and not ejected out Pharyngeal- Puree Delayed swallow initiation-vallecula;Reduced epiglottic inversion;Reduced laryngeal elevation;Reduced tongue base retraction;Pharyngeal residue - valleculae Pharyngeal -- Pharyngeal- Mechanical Soft -- Pharyngeal -- Pharyngeal- Regular -- Pharyngeal -- Pharyngeal- Multi-consistency -- Pharyngeal -- Pharyngeal- Pill -- Pharyngeal -- Pharyngeal Comment --  No flowsheet data found. No flowsheet data found. [name] [name] 04/01/2017, 3:46 PM                   Scheduled Meds: . acetaminophen  1,000 mg Oral QHS  . atorvastatin  80 mg Oral Daily  . buPROPion  150 mg Oral BID WC  . enoxaparin (LOVENOX) injection  40 mg  Subcutaneous Daily  . ferrous sulfate  325 mg Oral TID WC  . guaiFENesin  600 mg Oral BID  . levothyroxine  100 mcg Oral QAC breakfast  . metoprolol tartrate  12.5 mg Oral BID  . multivitamin with minerals  1 tablet Oral Daily  . pantoprazole  40 mg Oral Daily  . polyethylene glycol  17 g Oral Daily  . senna-docusate  1 tablet Oral BID   Continuous Infusions: . piperacillin-tazobactam (ZOSYN)  IV 3.375 g (04/02/17 1311)     LOS: 3 days     Cordelia Poche, MD [facility] 04/02/2017, 3:55 PM Pager: [phone]  If 7PM-7AM, please contact night-coverage [email or web address] Password [email or web address] 04/02/2017, 3:55 PM

## 2017-04-02 NOTE — Plan of Care (Signed)
  Clinical Measurements: Ability to maintain clinical measurements within normal limits will improve 04/02/2017 2004 - Progressing by Irish Lack, RN   Activity: Risk for activity intolerance will decrease 04/02/2017 2004 - Progressing by Irish Lack, RN   Pain Managment: General experience of comfort will improve 04/02/2017 2004 - Progressing by Irish Lack, RN

## 2017-04-02 NOTE — Plan of Care (Signed)
Patient has had good pain control and has ambulated several times today. Awaiting MRA of left leg.

## 2017-04-02 NOTE — Progress Notes (Signed)
Physical Therapy Treatment Patient Details Name: Trevor Griffin MRN: [id number] DOB: [birth year] Today's Date: 04/02/2017    History of Present Illness Gonsalo Cuthbertson Whortonis a 61 y.o.malepresenting with SOB and LLE pain. Chest CT showing pneumonia, nodules in left lobe, and worsening pulmonary parenchymal disease of right lobe. LE CT showing LLE thrombophlebitis. PMH significant ofCAD S/P PCI, COPD, lung cancer S/P lobectomy, tongue cancer S/P surgery, hypothyroidism, alcohol abuse, GERD, chronic systolic CHF.    PT Comments    Patient with limited tolerance, but much improved gait distance from last session.  Was unable to get to stairs for practice for home entry, but reviewed technique and assist from wife verbally.  Patient reports still feeling weak and felt d/c home too soon after last hospitalization.  Encouraged frequent mobility with nursing staff to ensure safe, successful d/c home when stable.  PT to follow acutely and feel more appropriate for HHPT at d/c than outpatient due to activity tolerance.    Follow Up Recommendations  Home health PT     Equipment Recommendations  None recommended by PT    Recommendations for Other Services       Precautions / Restrictions Precautions Precautions: Fall Precaution Comments: LLE edematous     Mobility  Bed Mobility   Bed Mobility: Supine to Sit;Sit to Supine     Supine to sit: Modified independent (Device/Increase time) Sit to supine: Min assist   General bed mobility comments: for L LE onto bed, cues for positioning  Transfers Overall transfer level: Needs assistance Equipment used: Rolling walker (2 wheeled) Transfers: Sit to/from Stand Sit to Stand: Supervision         General transfer comment: assist for IV  Ambulation/Gait Ambulation/Gait assistance: Min guard Ambulation Distance (Feet): 140 Feet Assistive device: Rolling walker (2 wheeled) Gait Pattern/deviations: Step-to pattern;Antalgic     General  Gait Details: keeps L heel up due to pain/tightness in calf   Stairs            Wheelchair Mobility    Modified Rankin (Stroke Patients Only)       Balance Overall balance assessment: Needs assistance   Sitting balance-Leahy Scale: Good     Standing balance support: Bilateral upper extremity supported;During functional activity Standing balance-Leahy Scale: Poor Standing balance comment: pt reliant on UE support from RW for standing balance.                             Cognition Arousal/Alertness: Awake/alert Behavior During Therapy: WFL for tasks assessed/performed Overall Cognitive Status: Within Functional Limits for tasks assessed                                        Exercises      General Comments General comments (skin integrity, edema, etc.): BP sitting 113/68      Pertinent Vitals/Pain Pain Score: 6  Pain Location: LLE Pain Descriptors / Indicators: Constant Pain Intervention(s): Monitored during session;Repositioned;Premedicated before session    Home Living                      Prior Function            PT Goals (current goals can now be found in the care plan section) Progress towards PT goals: Progressing toward goals    Frequency    Min 3X/week  PT Plan Discharge plan needs to be updated    Co-evaluation              AM-PAC PT "6 Clicks" Daily Activity  Outcome Measure  Difficulty turning over in bed (including adjusting bedclothes, sheets and blankets)?: None Difficulty moving from lying on back to sitting on the side of the bed? : Unable Difficulty sitting down on and standing up from a chair with arms (e.g., wheelchair, bedside commode, etc,.)?: Unable Help needed moving to and from a bed to chair (including a wheelchair)?: A Little Help needed walking in hospital room?: A Little Help needed climbing 3-5 steps with a railing? : A Lot 6 Click Score: 14    End of Session  Equipment Utilized During Treatment: Gait belt Activity Tolerance: Patient limited by pain Patient left: in bed;with call bell/phone within reach;with bed alarm set   PT Visit Diagnosis: Difficulty in walking, not elsewhere classified (R26.2);Pain;Other abnormalities of gait and mobility (R26.89) Pain - Right/Left: Left Pain - part of body: Leg     Time: 1417-1440 PT Time Calculation (min) (ACUTE ONLY): 23 min  Charges:  $Gait Training: 8-22 mins $Therapeutic Activity: 8-22 mins                    G CodesMagda Kiel, [place] [phone] 04/02/2017    Reginia Naas 04/02/2017, 3:59 PM

## 2017-04-03 ENCOUNTER — Inpatient Hospital Stay (HOSPITAL_COMMUNITY): Payer: Medicare Other

## 2017-04-03 MED ORDER — GADOBENATE DIMEGLUMINE 529 MG/ML IV SOLN
18.0000 mL | Freq: Once | INTRAVENOUS | Status: AC | PRN
  Administered 2017-04-03: 18 mL via INTRAVENOUS

## 2017-04-03 MED ORDER — ENSURE ENLIVE PO LIQD
237.0000 mL | Freq: Four times a day (QID) | ORAL | Status: DC
  Administered 2017-04-04: 237 mL via ORAL

## 2017-04-03 MED ORDER — ONDANSETRON 4 MG PO TBDP
4.0000 mg | ORAL_TABLET | Freq: Three times a day (TID) | ORAL | Status: DC | PRN
  Administered 2017-04-03: 4 mg via ORAL
  Filled 2017-04-03: qty 1

## 2017-04-03 NOTE — Progress Notes (Signed)
Physical Therapy Treatment Patient Details Name: Trevor Griffin MRN: [id number] DOB: 1955/09/05 Today's Date: 04/03/2017    History of Present Illness Trevor Griffin a 61 y.o.malepresenting with SOB and LLE pain. Chest CT showing pneumonia, nodules in left lobe, and worsening pulmonary parenchymal disease of right lobe. LE CT showing LLE thrombophlebitis. PMH significant ofCAD S/P PCI, COPD, lung cancer S/P lobectomy, tongue cancer S/P surgery, hypothyroidism, alcohol abuse, GERD, chronic systolic CHF.    PT Comments    Patient progressing slowly towards PT goals. Reports nausea today after taking medication. Tolerated stair training with Min guard assist for balance/safety. Still unable to place left heel on floor secondary to tightness and shooting pains up through thigh. Will await MRI of LLE prior to teaching pt about stretching exercises. Will follow.   Follow Up Recommendations  Home health PT     Equipment Recommendations  None recommended by PT    Recommendations for Other Services       Precautions / Restrictions Precautions Precautions: Fall Precaution Comments: LLE edematous  Restrictions Weight Bearing Restrictions: No    Mobility  Bed Mobility               General bed mobility comments: Sitting in chair upon PT arrival.  Transfers Overall transfer level: Needs assistance Equipment used: Rolling walker (2 wheeled) Transfers: Sit to/from Stand Sit to Stand: Supervision         General transfer comment: Supervision for safety. Stood from Youth worker. Good demo of hand placement.   Ambulation/Gait Ambulation/Gait assistance: Min guard Ambulation Distance (Feet): 150 Feet Assistive device: Rolling walker (2 wheeled) Gait Pattern/deviations: Step-to pattern;Antalgic Gait velocity: decerased Gait velocity interpretation: <1.8 ft/sec, indicative of risk for recurrent falls General Gait Details: keeps L heel up due to pain/tightness in calf. Able  to place heel on floor during stance but has shooting pains up leg.   Stairs Stairs: Yes   Stair Management: Step to pattern;Two rails Number of Stairs: 3(+ 2 steps x2 bouts) General stair comments: Cues for technique and safety. Heavy reliance on UEs to offload LE.  Wheelchair Mobility    Modified Rankin (Stroke Patients Only)       Balance Overall balance assessment: Needs assistance Sitting-balance support: Feet supported;No upper extremity supported Sitting balance-Leahy Scale: Good     Standing balance support: During functional activity;Bilateral upper extremity supported Standing balance-Leahy Scale: Poor Standing balance comment: pt reliant on UE support from RW for standing balance.                             Cognition Arousal/Alertness: Awake/alert Behavior During Therapy: WFL for tasks assessed/performed Overall Cognitive Status: Within Functional Limits for tasks assessed                                        Exercises      General Comments        Pertinent Vitals/Pain Pain Assessment: 0-10 Pain Score: 7  Pain Location: LLE Pain Descriptors / Indicators: Constant;Shooting Pain Intervention(s): Monitored during session;Repositioned    Home Living                      Prior Function            PT Goals (current goals can now be found in the care plan section) Progress towards  PT goals: Progressing toward goals    Frequency    Min 3X/week      PT Plan Current plan remains appropriate    Co-evaluation              AM-PAC PT "6 Clicks" Daily Activity  Outcome Measure  Difficulty turning over in bed (including adjusting bedclothes, sheets and blankets)?: None Difficulty moving from lying on back to sitting on the side of the bed? : None Difficulty sitting down on and standing up from a chair with arms (e.g., wheelchair, bedside commode, etc,.)?: None Help needed moving to and from a bed to  chair (including a wheelchair)?: A Little Help needed walking in hospital room?: A Little Help needed climbing 3-5 steps with a railing? : A Little 6 Click Score: 21    End of Session Equipment Utilized During Treatment: Gait belt Activity Tolerance: Patient limited by pain(nausea) Patient left: in chair;with call bell/phone within reach Nurse Communication: Mobility status PT Visit Diagnosis: Difficulty in walking, not elsewhere classified (R26.2);Pain;Other abnormalities of gait and mobility (R26.89) Pain - Right/Left: Left Pain - part of body: Leg     Time: 9381-0175 PT Time Calculation (min) (ACUTE ONLY): 23 min  Charges:  $Gait Training: 23-37 mins                    G Codes:       Trevor Griffin, PT, DPT [phone]     Trevor Griffin 04/03/2017, 10:47 AM

## 2017-04-03 NOTE — Progress Notes (Signed)
Initial Nutrition Assessment  DOCUMENTATION CODES:   Not applicable  INTERVENTION:   Increase Ensure Enlive po QID, each supplement provides 350 kcal and 20 grams of protein  NUTRITION DIAGNOSIS:   Inadequate oral intake related to dysphagia, decreased appetite as evidenced by per patient/family report.  GOAL:   Patient will meet greater than or equal to 90% of their needs  MONITOR:   PO intake, Supplement acceptance, Weight trends, I & O's  REASON FOR ASSESSMENT:   Malnutrition Screening Tool    ASSESSMENT:   Pt with PMH of GERD, tongue cancer s/p radiation and surgical intervention, lung cancer, CAD s/p percutaneous coronary angioplasty, HLD, HTN, COPD presents with consolidation lung   Pt scheduled for [facility] Wednesday. Pt discussed methods SLP taught him of chin tuck and he thinks he can drink liquids with less difficulty now.   Per chart, pt's weight seems to be stable.   Pt reports a decrease PO intake r/t difficulty swallowing and taste changes. RD discussed methods to aid in taste changes such as utilizing plastic ware instead of metal utensils and incorporating lemon juices and vingear. Pt reports he has tried everything and uses an alcohol based mouthwash daily at home.  Per chart, pt consumed 85-100% of meals during this admission.  Pt reports his intake varies and sometimes all that tastes good is a peanut butter and jelly sandwich. Pt does enjoy the Ensure supplements and is willing to continue while admitted. Pt reports he can drink up to 4/day and does not wish to get another feeding tube. Pt had feeding tube while undergoing treatments (~2014)   Pt with cellulitis to left leg   Labs reviewed; CBG 121, Na 130, CRP 10.3, Hemoglobin 9.0 Medications reviewed; ferrous sulfate, multivitamin, Protonix, Miralax, Senokot-S  NUTRITION - FOCUSED PHYSICAL EXAM:    Most Recent Value  Orbital Region  No depletion  Upper Arm Region  No depletion  Thoracic and Lumbar  Region  No depletion  Buccal Region  No depletion  Temple Region  No depletion  Clavicle Bone Region  No depletion  Clavicle and Acromion Bone Region  No depletion  Scapular Bone Region  No depletion  Dorsal Hand  No depletion  Patellar Region  No depletion  Anterior Thigh Region  No depletion  Posterior Calf Region  No depletion  Edema (RD Assessment)  None       Diet Order:  Diet Heart Room service appropriate? Yes; Fluid consistency: Thin  EDUCATION NEEDS:   Education needs have been addressed  Skin:  Skin Assessment: Reviewed RN Assessment  Last BM:  04/02/17  Height:   Ht Readings from Last 1 Encounters:  03/31/17 5\' 8"  (1.727 m)    Weight:   Wt Readings from Last 1 Encounters:  03/31/17 192 lb 0.3 oz (87.1 kg)    Ideal Body Weight:  70 kg  BMI:  Body mass index is 29.2 kg/m.  Estimated Nutritional Needs:   Kcal:  2100-2300  Protein:  120-130 grams  Fluid:  >/= 2.1 L/d  Parks Ranger, MS, RDN, LDN 04/03/2017 4:44 PM

## 2017-04-03 NOTE — Progress Notes (Signed)
PROGRESS NOTE    Trevor Griffin [facility]  ZOX:[id number] DOB: 09-Feb-1956 DOA: 03/30/2017 PCP: Patient, No Pcp Per   Brief Narrative: Trevor Griffin is a 61 y.o. male who presented with dyspnea. 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 Patient had a recent hospitalization November 12-18, for left leg thrombophlebitis/cellulitis. She was discharge with aspirin. At home he has been developing progressive dyspnea on exertion, and easy fatigability, limiting his physical activity. His left leg continued to be edematous, and ecchymotic. On initial physical examination blood pressure 109/71, heart rate 107, respiratory 25, saturation 96%. Is mucous membranes were moist, his lungs had decrease air movement with positive stress in the right apical lobe, scattered rhonchi. Heart S1-S2 present, rhythmic, no gallops, rubs or murmurs, the abdomen was protuberant but nontender, left lower extremity with nonpitting edema ++/+++ tense, with positive ecchymosis in the inner leg and thigh. No frank erythema or increased local temperature. White count 6.8, he will and 6.4, hematocrit 21.1, platelets 300. CT of the chest with right upper lobe infiltrate associated with a loculated right pleural effusion, left lower extremity CT with superficial thrombophlebitis of the greater saphenous vein distal to the knee, persistence of interstitial edema throughout the extremity, no drainable fluid, no soft tissue air. eKG with a normal sinus rhythm, 100 bpm, normal axis, normal intervals.  Patient was admitted to the hospital with the working diagnosis of right upper lobe pneumonia, complicated by symptomatic anemia, and left lower extremity thrombophlebitis     Assessment & Plan:   Principal Problem:   Consolidation lung ([place]) Active Problems:   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[place])   Iron  deficiency anemia   Hx of cancer of lung   Left leg swelling   Lung consolidation Aspiration pneumonia Patient with pneumonia. Symptoms improving on antibiotics. -continue Zosyn -flutter valve  Acute respiratory failure with hypoxia Secondary to above. Improving.  Acute on chronic symptomatic anemia S/p 3 units PRBC likely secondary to ecchymosis of left leg. Hemoglobin up to 8.2 from 6.6 and stable at 9.0.  Left lower extremity thrombophlebitis Improved.  LE edema Chronic for the past 3 months. Non-pitting. Multiple CTs unremarkable except for circumferential edema. Ultrasounds significant for history of superior thrombophebitis which has resolved. -MRI leg pending  COPD Stable. No exacerbation  Chronic diastolic heart failure Stable.   DVT prophylaxis: Lovenox Code Status: Full code Family Communication: None at bedside Disposition Plan: when medically stable.   Consultants:   None  Procedures:   3 units PRBC  Antimicrobials:  Vancomycin  Zosyn    Subjective: Left leg pain.  Objective: Vitals:   04/03/17 0109 04/03/17 0505 04/03/17 1020 04/03/17 1415  BP: 116/61 112/69 114/60 (P) 110/62  Pulse: 72 74 85 (P) 68  Resp: 20 20 20  (P) 18  Temp: 98 F (36.7 C) 98.4 F (36.9 C) (!) 97.5 F (36.4 C) (P) 97.9 F (36.6 C)  TempSrc: Axillary Axillary Axillary (P) Axillary  SpO2: 99% 98% 100% (P) 100%  Weight:      Height:        Intake/Output Summary (Last 24 hours) at 04/03/2017 1449 Last data filed at 04/03/2017 1300 Gross per 24 hour  Intake 480 ml  Output 2525 ml  Net -2045 ml   Filed Weights   03/30/17 1436 03/31/17 0100  Weight: 89.8 kg (198 lb) 87.1 kg (192 lb 0.3 oz)  Examination:  General exam: Appears calm and comfortable  Respiratory system: Clear to auscultation. Respiratory effort normal. Cardiovascular system: S1 & S2 heard, RRR. No murmurs, rubs, gallops or clicks. Gastrointestinal system: Abdomen is nondistended, soft  and nontender. No organomegaly or masses felt. Normal bowel sounds heard. Central nervous system: Alert and oriented. No focal neurological deficits. Extremities: Left leg non pitting edema. No calf tenderness Lymph: mild left inguinal lymphadenopathy Skin: minimal ecchymosis of left leg extending from lower medial thigh down medial aspect of lower leg Psychiatry: Judgement and insight appear normal. Mood & affect appropriate.     Data Reviewed: I have personally reviewed following labs and imaging studies  CBC: Recent Labs  Lab 03/30/17 1504 03/31/17 0808 04/01/17 0314 04/02/17 1030  WBC 6.8  --  4.6 4.3  NEUTROABS  --   --  3.1  --   HGB 6.4* 6.6* 8.2* 9.0*  HCT 21.1* 21.8* 25.4* 28.3*  MCV 89.0  --  87.3 89.0  PLT 300  --  215 030   Basic Metabolic Panel: Recent Labs  Lab 03/30/17 1504 04/01/17 0314 04/02/17 1030  NA 128* 129* 130*  K 4.0 3.9 3.7  CL 98* 99* 98*  CO2 21* 22 25  GLUCOSE 96 90 129*  BUN 9 7 6   CREATININE 1.01 0.94 0.98  CALCIUM 8.5* 8.1* 8.4*   GFR: Estimated Creatinine Clearance: 85 mL/min (by C-G formula based on SCr of 0.98 mg/dL). Liver Function Tests: No results for input(s): AST, ALT, ALKPHOS, BILITOT, PROT, ALBUMIN in the last 168 hours. No results for input(s): LIPASE, AMYLASE in the last 168 hours. No results for input(s): AMMONIA in the last 168 hours. Coagulation Profile: No results for input(s): INR, PROTIME in the last 168 hours. Cardiac Enzymes: Recent Labs  Lab 03/30/17 1504  CKTOTAL 62   BNP (last 3 results) No results for input(s): PROBNP in the last 8760 hours. HbA1C: No results for input(s): HGBA1C in the last 72 hours. CBG: Recent Labs  Lab 04/02/17 0554  GLUCAP 121*   Lipid Profile: No results for input(s): CHOL, HDL, LDLCALC, TRIG, CHOLHDL, LDLDIRECT in the last 72 hours. Thyroid Function Tests: No results for input(s): TSH, T4TOTAL, FREET4, T3FREE, THYROIDAB in the last 72 hours. Anemia Panel: No results for  input(s): VITAMINB12, FOLATE, FERRITIN, TIBC, IRON, RETICCTPCT in the last 72 hours. Sepsis Labs: Recent Labs  Lab 03/30/17 1521 03/30/17 1803 03/30/17 2031  LATICACIDVEN 2.40* 1.43 1.49    Recent Results (from the past 240 hour(s))  Culture, blood (Routine X 2) w Reflex to ID Panel     Status: None (Preliminary result)   Collection Time: 03/30/17  4:20 PM  Result Value Ref Range Status   Specimen Description BLOOD RIGHT ANTECUBITAL  Final   Special Requests   Final    BOTTLES DRAWN AEROBIC AND ANAEROBIC Blood Culture adequate volume   Culture NO GROWTH 4 DAYS  Final   Report Status PENDING  Incomplete  Culture, blood (Routine X 2) w Reflex to ID Panel     Status: None (Preliminary result)   Collection Time: 03/30/17  4:30 PM  Result Value Ref Range Status   Specimen Description BLOOD LEFT ANTECUBITAL  Final   Special Requests   Final    BOTTLES DRAWN AEROBIC AND ANAEROBIC Blood Culture adequate volume   Culture NO GROWTH 4 DAYS  Final   Report Status PENDING  Incomplete         Radiology Studies: No results found.  Scheduled Meds: . acetaminophen  1,000 mg Oral QHS  . atorvastatin  80 mg Oral Daily  . buPROPion  150 mg Oral BID WC  . enoxaparin (LOVENOX) injection  40 mg Subcutaneous Daily  . feeding supplement (ENSURE ENLIVE)  237 mL Oral BID BM  . ferrous sulfate  325 mg Oral TID WC  . guaiFENesin  600 mg Oral BID  . levothyroxine  100 mcg Oral QAC breakfast  . metoprolol tartrate  12.5 mg Oral BID  . multivitamin with minerals  1 tablet Oral Daily  . pantoprazole  40 mg Oral Daily  . polyethylene glycol  17 g Oral Daily  . senna-docusate  1 tablet Oral BID   Continuous Infusions: . piperacillin-tazobactam (ZOSYN)  IV Stopped (04/03/17 1004)     LOS: 4 days     Cordelia Poche, MD [facility] 04/03/2017, 2:49 PM Pager: [phone]  If 7PM-7AM, please contact night-coverage [email or web address] Password [email or web address] 04/03/2017, 2:49 PM

## 2017-04-03 NOTE — Care Management Note (Signed)
Case Management Note  Patient Details  Name: JESAIAH FABIANO MRN: [id number] Date of Birth: 04/23/1956  Subjective/Objective:                    Action/Plan: Plan, when patient is medically ready, is to d/c home with [facility] services. Pt was set up with [facility] prior to admission. He would like to continue with [facility]. Jermaine with [place] notified of [place] needs.  CM continuing to follow.  Expected Discharge Date:                  Expected Discharge Plan:  Learned  In-House Referral:     Discharge planning Services  CM Consult  Post Acute Care Choice:    Choice offered to:  Patient  DME Arranged:    DME Agency:     HH Arranged:  PT [place]:  [place]  Status of Service:  In process, will continue to follow  If discussed at Long Length of Stay Meetings, dates discussed:    Additional Comments:  Pollie Friar, RN 04/03/2017, 3:33 PM

## 2017-04-03 NOTE — Progress Notes (Signed)
  Speech Language Pathology   Patient Details Name: Trevor Griffin MRN: [id number] DOB: 1956/01/13 Today's Date: 04/03/2017   F/u after [name] MBS - pt using chin tuck with liquids which is helping to prevent penetration/aspiration.  No further SLP f/u is warranted.  Our services will sign off.                         [name] [name] 04/03/2017, 2:57 PM

## 2017-04-03 NOTE — Care Management Important Message (Signed)
Important Message  Patient Details  Name: Trevor Griffin MRN: [id number] Date of Birth: 12-Jun-1955   Medicare Important Message Given:  Yes    Nathen May 04/03/2017, 11:26 AM

## 2017-04-04 DIAGNOSIS — Z85118 Personal history of other malignant neoplasm of bronchus and lung: Secondary | ICD-10-CM

## 2017-04-04 DIAGNOSIS — M60862 Other myositis, left lower leg: Secondary | ICD-10-CM | POA: Diagnosis present

## 2017-04-04 DIAGNOSIS — J690 Pneumonitis due to inhalation of food and vomit: Principal | ICD-10-CM

## 2017-04-04 LAB — CULTURE, BLOOD (ROUTINE X 2)
CULTURE: NO GROWTH
Culture: NO GROWTH
SPECIAL REQUESTS: ADEQUATE
Special Requests: ADEQUATE

## 2017-04-04 MED ORDER — AMOXICILLIN-POT CLAVULANATE 875-125 MG PO TABS: 1.0000 | ORAL_TABLET | Freq: Two times a day (BID) | ORAL | 0 refills | Status: AC

## 2017-04-04 MED ORDER — ENSURE ENLIVE PO LIQD: 237.0000 mL | Freq: Four times a day (QID) | ORAL | 0 refills | Status: DC

## 2017-04-04 MED ORDER — PREDNISONE 20 MG PO TABS: 20.0000 mg | ORAL_TABLET | Freq: Every day | ORAL | 0 refills | Status: AC

## 2017-04-04 MED ORDER — OXYCODONE HCL 5 MG PO TABS: 5.0000 mg | ORAL_TABLET | ORAL | 0 refills | Status: DC | PRN

## 2017-04-04 MED ORDER — ONDANSETRON 4 MG PO TBDP: 4.0000 mg | ORAL_TABLET | Freq: Three times a day (TID) | ORAL | 0 refills | Status: DC | PRN

## 2017-04-04 NOTE — Discharge Instructions (Signed)
Trevor Griffin,  You were admitted because of pneumonia. This is recurrent and likely secondary to aspiration (inhalation of oral contents). Speech therapy worked with you and developed techniques to minimize this. With regard to your leg, there is evidence of swelling and inflammation. I am discharging you with steroids for a few days to see if your leg improves. Please follow-up with your primary care physician.

## 2017-04-04 NOTE — Progress Notes (Signed)
Pt discharged via wheelchair with nurse tech  

## 2017-04-04 NOTE — Progress Notes (Signed)
Pt discharge education provided at bedside. Pt IV discontinued, catheter intact. Pt has all belongings. Awaiting pt transportation

## 2017-04-04 NOTE — Discharge Summary (Signed)
Physician Discharge Summary  Trevor Griffin [facility] TMH:[id number] DOB: 11/27/1955 DOA: 03/30/2017  PCP: Patient, No Pcp Per  Admit date: 03/30/2017 Discharge date: 04/04/2017  Admitted From: Home Disposition: Home  Recommendations for Outpatient Follow-up:  1. Follow up with PCP in 1 week 2. Prednisone trial for possible inflammatory myositis. May need biopsy if no continued improvement. 3. Please obtain BMP/CBC in one week 4. Please follow up on the following pending results: None  Home Health: Physical therapy Equipment/Devices: None  Discharge Condition: Stable CODE STATUS: Full code Diet recommendation: Dysphagia 3 diet   Brief/Interim Summary:  Admission HPI written by Etta Quill, DO   Chief Complaint: SOB  HPI: Trevor Griffin is a 61 y.o. 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  Concern for RML, RUL spread and lymphnode enlargement.  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  Patient has been having trouble with L leg pain and swelling for the past 3 months.  Was actually admitted for this on 11/12 - 11/18 to our service.  Work up at that time showed only superficial thrombophlebitis, no DVT on [place].  He was covered for cellulitis.  Started on Xarelto but due to anemia didn't end up really starting this and just wound up on 325 ASA daily (divided in 2 doses).  Cards was hesitant to prescribe this given his severe anemia (HGB 7 already this month).  He presents to the ED with continued leg pain and now has progressive SOB, severe DOE.  Cant walk to doorway without getting SOB.   ED Course: HGB 6.4, a repeat US of LLE is again neg for DVT, CT of LLE also neg for DVT.  CTA chest is neg for PE; however, does show a worsening "inflamatory process" with ground glass opacities throughout the R  lung, this compared to CT just earlier this month.  Persistent lymph node enlargement and pulm nodules.    Hospital course:  Lung consolidation Aspiration pneumonia Zosyn prescribed empirically and transitioned to Augmentin on discharge. Speech therapy evaluated and patient has mild aspiration risk. Modification techniques discussed with patient by speech therapy to minimize aspiration. Flutter valve every one hour.symptoms improved prior to discharge.  Dysphagia, oropharyngeal phase Patient taught chin-tuck technique. Dysphagia 3 diet.  Acute respiratory failure with hypoxia Secondary to above. Improving.  Acute on chronic symptomatic anemia S/p 3 units PRBC likely secondary to ecchymosis of left leg. Hemoglobin up to 8.2 from 6.6 and stable at 9.0. No evidence of bleeding.  Left lower extremity thrombophlebitis Improved.  LE edema Chronic for the past 3 months. Non-pitting. Multiple CTs unremarkable except for circumferential edema. Prior ultrasounds significant for history of superior thrombophebitis which has resolved per ultrasound this admission. MRI obtained and again was significant for non-specific edema and also inflammatory vs infectious myositis. Since patient has been treated for infectious causes, will discharge with prednisone trial.  COPD Stable. No exacerbation  Chronic diastolic heart failure Stable.  Tongue cancer Patient is s/p resection and graft. 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)   Iron deficiency anemia   Hx of cancer of lung   Left leg swelling   Myositis of left lower leg    Discharge  Instructions  Discharge Instructions    Call MD for:  difficulty breathing, headache or visual disturbances   Complete by:  As directed    Call MD for:  severe uncontrolled pain   Complete by:  As directed    Diet - low  sodium heart healthy   Complete by:  As directed    Increase activity slowly   Complete by:  As directed      Allergies as of 04/04/2017      Reactions   Hydromorphone Nausea Only, Other (See Comments)   Headaches also   Nubain [nalbuphine Hcl] Nausea Only, Other (See Comments)   Headaches also      Medication List    STOP taking these medications   acetaminophen 500 MG tablet Commonly known as:  TYLENOL   HYDROcodone-acetaminophen 5-325 MG tablet Commonly known as:  NORCO/VICODIN   rivaroxaban 10 MG Tabs tablet Commonly known as:  XARELTO     TAKE these medications   amoxicillin-clavulanate 875-125 MG tablet Commonly known as:  AUGMENTIN Take 1 tablet by mouth 2 (two) times daily for 3 days.   aspirin 81 MG tablet Take 1 tablet (81 mg total) by mouth daily. What changed:    how much to take  when to take this   atorvastatin 80 MG tablet Commonly known as:  LIPITOR Take 1 tablet (80 mg total) by mouth daily.   B-complex with vitamin C tablet Take 1 tablet daily by mouth.   buPROPion 150 MG 12 hr tablet Commonly known as:  WELLBUTRIN SR Take 1 tablet (150 mg total) by mouth 2 (two) times daily.   esomeprazole 20 MG capsule Commonly known as:  NEXIUM Take 20 mg by mouth daily.   feeding supplement (ENSURE ENLIVE) Liqd Take 237 mLs by mouth 4 (four) times daily.   ferrous sulfate 325 (65 FE) MG tablet Take 1 tablet (325 mg total) 3 (three) times daily with meals by mouth.   guaiFENesin 600 MG 12 hr tablet Commonly known as:  MUCINEX Take 1 tablet (600 mg total) 2 (two) times daily by mouth.   levothyroxine 100 MCG tablet Commonly known as:  SYNTHROID, LEVOTHROID Take 1 tablet (100 mcg total) by mouth daily before breakfast.   methocarbamol 500 MG tablet Commonly known as:  ROBAXIN Take 1 tablet (500 mg total) every 8 (eight) hours as needed by mouth for muscle spasms.   metoprolol tartrate 25 MG tablet Commonly known as:  LOPRESSOR Take 0.5  tablets (12.5 mg total) by mouth 2 (two) times daily.   multivitamin with minerals tablet Take 1 tablet by mouth daily.   nitroGLYCERIN 0.4 MG SL tablet Commonly known as:  NITROSTAT Place 1 tablet (0.4 mg total) under the tongue every 5 (five) minutes as needed (up to 3 doses). For chest pain What changed:    reasons to take this  additional instructions   ondansetron 4 MG disintegrating tablet Commonly known as:  ZOFRAN-ODT Take 1 tablet (4 mg total) by mouth every 8 (eight) hours as needed for nausea or vomiting.   oxyCODONE 5 MG immediate release tablet Commonly known as:  Oxy IR/ROXICODONE Take 1 tablet (5 mg total) by mouth every 4 (four) hours as needed for severe pain.   polyethylene glycol packet Commonly known as:  MIRALAX / GLYCOLAX Take 17 g daily by mouth.   predniSONE 20 MG tablet Commonly known as:  DELTASONE Take 1 tablet (20 mg total) by mouth daily with breakfast for 5 days. Start taking on:  04/05/2017   senna-docusate 8.6-50 MG tablet Commonly known as:  Senokot-S Take 1 tablet 2 (two) times daily by mouth.      Follow-up Information    Dr Scarlette Calico Follow up on 04/08/2017.   Why:  Your appointment time is 2 pm. Please arrive 15 min early and bring: picture ID, insurance card and medication list. Contact information: [address], [place], [place] [address]   [phone]         Allergies  Allergen Reactions  . Hydromorphone Nausea Only and Other (See Comments)    Headaches also  . Nubain [Nalbuphine Hcl] Nausea Only and Other (See Comments)    Headaches also    Consultations:  None   Procedures/Studies: Ct Angio Chest Pe W And/or Wo Contrast  Result Date: 03/30/2017 CLINICAL DATA:  Short of breath and chest pain EXAM: CT ANGIOGRAPHY CHEST WITH CONTRAST TECHNIQUE: Multidetector CT imaging of the chest was performed using the standard protocol during bolus administration of intravenous contrast. Multiplanar CT image reconstructions  and MIPs were obtained to evaluate the vascular anatomy. CONTRAST:  127mL ISOVUE-370 IOPAMIDOL (ISOVUE-370) INJECTION 76% COMPARISON:  03/16/2017 FINDINGS: Cardiovascular: There are no filling defects in the pulmonary arterial tree to suggest acute pulmonary thromboembolism. Atherosclerotic calcifications in the thoracic aorta are noted. There is no evidence of dissection or aortic aneurysm. Three vessel coronary artery calcifications are noted. Mediastinum/Nodes: 14 mm right paratracheal short axis diameter lymph node on image 41. Several other scattered smaller lymph nodes are present in the mediastinum. There is mildly prominent peribronchovascular soft tissue in the hilar regions. Lungs/Pleura: Loculated pleural fluid is seen surrounding the right lung. It is present at the posterior right lung base and around the right lung apex. Extensive ground-glass opacification and consolidation is present within the right upper lobe a more right middle lobe, and superior segment of the right lower lobe. Ground-glass opacities and nodules in the left upper lobe have slightly improved. Largest nodule on the left measures 8 mm on image 56. Nodular densities at the posterior left lung base have also developed. There are metallic densities inferior to the right hilum and in the right lower lobe laterally compatible with postoperative changes. Upper Abdomen: Gallstones are present. Liver is unremarkable. Visualized spleen is unremarkable. Splenule is noted. Musculoskeletal: No vertebral compression deformity. Review of the MIP images confirms the above findings. IMPRESSION: No evidence of acute pulmonary thromboembolism. Pulmonary parenchymal disease has worsened, particularly throughout the right lung. This characterize by consolidation and ground-glass opacities. Findings are most consistent with a progressive inflammatory process. Mediastinal adenopathy has increased since the prior study. Differential diagnosis includes  both inflammatory and neoplastic etiology. There are nodules in the left lung, and the largest in the left upper lobe measures 8 mm. Non-contrast chest CT at 3-6 months is recommended. If the nodules are stable at time of repeat CT, then future CT at 18-24 months (from today's scan) is considered optional for low-risk patients, but is recommended for high-risk patients. This recommendation follows the consensus statement: Guidelines for Management of Incidental Pulmonary Nodules Detected on CT Images: From the Fleischner Society 2017; Radiology 2017; 284:228-243. Loculated right pleural effusion has increased in volume. Cholelithiasis. Aortic Atherosclerosis (ICD10-I70.0). Electronically Signed   By: Marybelle Killings M.D.   On: 03/30/2017 18:56   Ct Angio Chest Pe W And/or Wo Contrast  Result Date: 03/16/2017 CLINICAL DATA:  Shortness of breath. EXAM: CT ANGIOGRAPHY CHEST WITH CONTRAST TECHNIQUE: Multidetector CT imaging of the chest was performed using  the standard protocol during bolus administration of intravenous contrast. Multiplanar CT image reconstructions and MIPs were obtained to evaluate the vascular anatomy. CONTRAST:  68 mL of Isovue 370 intravenously. COMPARISON:  CT scan of October 20, 2016. FINDINGS: Cardiovascular: Satisfactory opacification of the pulmonary arteries to the segmental level. No evidence of pulmonary embolism. Coronary artery calcifications are noted. Atherosclerosis of thoracic aorta is noted without aneurysm formation. No pericardial effusion. Mediastinum/Nodes: No enlarged mediastinal, hilar, or axillary lymph nodes. Thyroid gland, trachea, and esophagus demonstrate no significant findings. Lungs/Pleura: No pneumothorax or pleural effusion is noted. Status post right lobectomy. Multiple rounded densities are noted in the left upper lobe laterally which may represent pneumonia, but neoplasm cannot be excluded. The largest measures 11 mm. Similar abnormalities are noted in the right  lower lobe. Upper Abdomen: No acute abnormality. Musculoskeletal: No chest wall abnormality. No acute or significant osseous findings. Review of the MIP images confirms the above findings. IMPRESSION: No definite evidence of pulmonary embolus. Coronary artery calcifications are noted suggesting coronary artery disease. Status post right lobectomy. Multiple nodular densities are noted in the left upper lobe and right lower lobe with the largest measuring 11 mm. This may simply represent multifocal pneumonia, but metastatic disease cannot be excluded. Follow-up CT scan in 2-3 weeks is recommended to ensure resolution or stability. Aortic Atherosclerosis (ICD10-I70.0). Electronically Signed   By: Marijo Conception, M.D.   On: 03/16/2017 16:15   Mr Tibia Fibula Left W Wo Contrast  Result Date: 04/04/2017 CLINICAL DATA:  Chronic left lower extremity for the past 3 months. EXAM: MRI OF LOWER LEFT EXTREMITY WITHOUT AND WITH CONTRAST; MR OF THE LEFT LOWER EXTREMITY WITHOUT AND WITH CONTRAST TECHNIQUE: Multiplanar, multisequence MR imaging of the left leg was performed both before and after administration of intravenous contrast. CONTRAST:  47mL MULTIHANCE GADOBENATE DIMEGLUMINE 529 MG/ML IV SOLN COMPARISON:  CT left lower extremity dated March 30, 2017, and March 19, 2017. FINDINGS: Bones/Joint/Cartilage Symmetric, heterogeneous marrow signal within the bilateral femoral diaphyses. No focal bone lesion. No acute fracture or malalignment. No periosteal reaction. Old healed fracture deformity of the right mid tibia. Muscles and Tendons There is mild edema and enhancement involving the peripheral vastus medialis and vastus lateralis muscles, as well as the medial and lateral gastrocnemius muscles. The visualized tendons are intact. Soft tissues Diffuse soft tissue edema of the left lower extremity. Soft tissue edema below the knee appears slightly improved when compared to prior CT. No drainable fluid collection. The  left femoral, popliteal, and deep calf veins appear grossly patent. The great saphenous vein is not well evaluated. Moderate right hydrocele. IMPRESSION: 1. Diffuse soft tissue edema of the left lower extremity again seen, appearing slightly improved below the knee when compared to prior CT. This remains nonspecific in etiology, and could be infectious or inflammatory. No drainable fluid collection. 2. The deep veins in the left lower extremity appear grossly patent, however, if there is concern for deep venous thrombosis, ultrasound is the preferred modality for evaluation. The great saphenous vein is not well evaluated due to field of view. 3. Mild edema and enhancement involving vastus medialis, vastus lateralis, and medial and lateral gastrocnemius muscles, consistent with nonspecific myositis, likely inflammatory or infectious in etiology in the absence of any trauma. 4. No acute osseous abnormality or evidence of osteomyelitis. 5. Symmetric, heterogeneous marrow signal within the bilateral femoral diaphyses. Although this can be caused by marrow infiltrative processes, the most common causes include anemia, smoking, obesity, or advancing age.  Electronically Signed   By: Titus Dubin M.D.   On: 04/04/2017 08:38   Mr Femur Left W Wo Contrast  Result Date: 04/04/2017 CLINICAL DATA:  Chronic left lower extremity for the past 3 months. EXAM: MRI OF LOWER LEFT EXTREMITY WITHOUT AND WITH CONTRAST; MR OF THE LEFT LOWER EXTREMITY WITHOUT AND WITH CONTRAST TECHNIQUE: Multiplanar, multisequence MR imaging of the left leg was performed both before and after administration of intravenous contrast. CONTRAST:  89mL MULTIHANCE GADOBENATE DIMEGLUMINE 529 MG/ML IV SOLN COMPARISON:  CT left lower extremity dated March 30, 2017, and March 19, 2017. FINDINGS: Bones/Joint/Cartilage Symmetric, heterogeneous marrow signal within the bilateral femoral diaphyses. No focal bone lesion. No acute fracture or malalignment. No  periosteal reaction. Old healed fracture deformity of the right mid tibia. Muscles and Tendons There is mild edema and enhancement involving the peripheral vastus medialis and vastus lateralis muscles, as well as the medial and lateral gastrocnemius muscles. The visualized tendons are intact. Soft tissues Diffuse soft tissue edema of the left lower extremity. Soft tissue edema below the knee appears slightly improved when compared to prior CT. No drainable fluid collection. The left femoral, popliteal, and deep calf veins appear grossly patent. The great saphenous vein is not well evaluated. Moderate right hydrocele. IMPRESSION: 1. Diffuse soft tissue edema of the left lower extremity again seen, appearing slightly improved below the knee when compared to prior CT. This remains nonspecific in etiology, and could be infectious or inflammatory. No drainable fluid collection. 2. The deep veins in the left lower extremity appear grossly patent, however, if there is concern for deep venous thrombosis, ultrasound is the preferred modality for evaluation. The great saphenous vein is not well evaluated due to field of view. 3. Mild edema and enhancement involving vastus medialis, vastus lateralis, and medial and lateral gastrocnemius muscles, consistent with nonspecific myositis, likely inflammatory or infectious in etiology in the absence of any trauma. 4. No acute osseous abnormality or evidence of osteomyelitis. 5. Symmetric, heterogeneous marrow signal within the bilateral femoral diaphyses. Although this can be caused by marrow infiltrative processes, the most common causes include anemia, smoking, obesity, or advancing age. Electronically Signed   By: Titus Dubin M.D.   On: 04/04/2017 08:38   Dg Swallowing Func-speech Pathology  Result Date: 04/01/2017 Objective Swallowing Evaluation: Type of Study: MBS-Modified Barium Swallow Study  Patient Details Name: TYQUAN CARMICKLE MRN: [id number] Date of Birth:  25-Aug-1955 Today's Date: 04/01/2017 Time: SLP Time Calculation (min) (ACUTE ONLY): 25 min Past Medical History: Past Medical History: Diagnosis Date . Acute ST elevation myocardial infarction (STEMI) involving left anterior descending (LAD) coronary artery ([place]) 2008 and 2009  5/'08 - DES x 4 to p-m LAD; 2/'09 - PTCA of  in-stent thrombosis . Alcohol abuse  . Anxiety  . CAD S/P percutaneous coronary angioplasty 2008  a. Ant STEMI 5/08 w/ 4 Taxus DES p-m LAD (3.0 x 16,16,24,28 mm) ;  b. 2/'09:  Stopped Plavix-> Ant STEMI/stent thrombosis in LAD (PTCA - 3.8 mm), LCX 95m/d;  c. 10/12: Cath/PCI: p-m LAD stents 10-20% ISR, mLAD 40%, CFX 30-40%, mCFX 90% (Promus Premier DES 3.0 mm x 20 mm - 3.51mm ), EF 35%; d. cath 2/17/'16 no Sig CAD, patent LAD & Cx stents, EF 40-45% . Chronic pain  . COPD (chronic obstructive pulmonary disease) ([place])  . Depression  . Fatigue   Sleep study 6/08 was negative. Sleep study 5/13 showed no OSA. Marland Kitchen GERD (gastroesophageal reflux disease)  . Headache(784.0)   migraines .  Hx of radiation therapy 10/25/12-12/10/12   Right Tongue/ Bilateral Neck //Total Dose, 60 Gy in 30 Fractions . Hyperlipidemia  . Hypertension  . Ischemic cardiomyopathy   a. Echo 08/20/11 with EF 50-55% (previously 35%);  b. 02/2014 Echo: EF 45-50-%, mid-apicalanteroseptal AK, Gr 1 dd, mild AI, midlly dil LA. . Lung cancer ([place]) dx'd 2015 . Obesity  . Tobacco abuse  . Tongue cancer ([place]) dx'd 2014  s/p resection - [facility] Past Surgical History: Past Surgical History: Procedure Laterality Date . COLONOSCOPY N/A 11/25/2016  Procedure: COLONOSCOPY;  Surgeon: Clarene Essex, MD;  Location: [facility] END[facility]COPY;  Service: Endoscopy;  Laterality: N/A; . CORONARY ANGIOPLASTY WITH STENT PLACEMENT  2008-2012  a. 5/'08: Anterior STEMI: Proximal-mid LAD PCI: 4 total Taxus DES stents 3.0 mm x  16 mm, 16 mm, 24 mm and 28 mm.( post-dil 3.5 mm);; b. 2/'09: PTCA LAD in-stent thrombosis - 3.8 mm;; c. 10/'12: mCx Promus Element DES 3.0 x 20 mm- post-dil - 3.25  mm. -> patent in 2016 . ESOPHAGOGASTRODUODENOSCOPY (EGD) WITH PROPOFOL N/A 11/25/2016  Procedure: ESOPHAGOGASTRODUODENOSCOPY (EGD) WITH PROPOFOL;  Surgeon: Clarene Essex, MD;  Location: [facility]OS[facility];  Service: Endoscopy;  Laterality: N/A;  neonatal scope . FRACTURE SURGERY Right   as a child,leg . LEFT HEART CATH AND CORONARY ANGIOGRAPHY  2006-2012  a) 2006: minimal CAD, normal EF;; b) 5/'08 - Ant STEMI - 100% p-mLAD (DES x 4 in LAD);; c)2/'09: Ant STEMI for IST/thrombosis of LAD ->PTCA, 80% mCx;; 10/'12: mCx 80-90% - DES PCI, patent LAD stents, EF ~35% - anterolateral AK, apical severe HK. [place] Kitchen LEFT HEART CATHETERIZATION WITH CORONARY ANGIOGRAM N/A 06/21/2014  Procedure: LEFT HEART CATHETERIZATION WITH CORONARY ANGIOGRAM;  Surgeon: Burnell Blanks, MD;  Location: [facility] CATH LAB: No evidence of obstructive disease.  Patent LAD and circumflex stents.  EF 40-45%. . LOBECTOMY Right 06/06/2013  Procedure: LOBECTOMY;  Surgeon: Melrose Nakayama, MD;  Location: [place];  Service: Thoracic;  Laterality: Right; . TRANSTHORACIC ECHOCARDIOGRAM  02/2014  EF 45-50%.  Akinesis of the mid apical anteroseptal wall.  GR 1 DD.  Mild MR. . VIDEO ASSISTED THORACOSCOPY (VATS)/WEDGE RESECTION Right 06/06/2013  Procedure: VIDEO ASSISTED THORACOSCOPY (VATS)/WEDGE RESECTION;  Surgeon: Melrose Nakayama, MD;  Location: [place];  Service: Thoracic;  Laterality: Right; HPI: 61 y.o. male with hx squamous cell carcinoma right tongue (Underwent trach, partial glossectomy with left radial forearm fasicocutaneous free flap reconstructiion, and bilateral selective neck dissection 09/16/12), radiation tx, mets to RLL s/p RLL lobectomy 2015, admitted with ongoing leg pain, progressive SOB.  CTA chest is neg for PE; however, does show a worsening "inflamatory process" with ground glass opacities throughout the R lung, this compared to CT just earlier this month. W/U pending - DDX HCAP vs lymphangitic carcinomatosis, iron def anemia, left leg swelling.  Pt  also describes ongoing difficulty swallowing, frequent choking episodes, inability to eat any meats/tough foods, occasional regurgitation.   Subjective: alert, good historian Assessment / Plan / Recommendation CHL IP CLINICAL IMPRESSIONS 04/01/2017 Clinical Impression Pt presents with a moderate oral, mild pharyngeal dysphagia s/p partial glossectomy and radiation in 2014.  Given absence of right portion of tongue, pt has difficulty forming a cohesive bolus and propelling bolus through pharynx.  There is limited velopharyngeal closure.  Soft solids are passed in small sub-portions of bolus into pharynx, where they reside in valleculae, and are cleared into eosphagus.  It may take 5-7 subswallows to pass a single solid food bolus.  Thin liquids enter laryngeal vestibule consistently, often reaching  level of the vocal folds.  A chin tuck was effective in assisting with laryngeal vestibule closure, preventing penetration.  Screen of the esophagus revealed no barium stasis, and the UES appeared to relax adequately to receive POs.  Recommend a mechanical soft diet; avoidance of tough meats/breads, continue thin liquids with utilization of chin tuck to help with airway protection.  Video of MBS reviewed and discussed with pt, who agrees with recommendations. SLP Visit Diagnosis Dysphagia, oropharyngeal phase (R13.12) Attention and concentration deficit following -- Frontal lobe and executive function deficit following -- Impact on safety and function Mild aspiration risk   CHL IP TREATMENT RECOMMENDATION 04/01/2017 Treatment Recommendations No treatment recommended at this time   No flowsheet data found. CHL IP DIET RECOMMENDATION 04/01/2017 SLP Diet Recommendations -- Liquid Administration via -- Medication Administration -- Compensations Chin tuck Postural Changes --   CHL IP OTHER RECOMMENDATIONS 04/01/2017 Recommended Consults -- Oral Care Recommendations Oral care BID Other Recommendations --   CHL IP FOLLOW UP  RECOMMENDATIONS 04/01/2017 Follow up Recommendations None   No flowsheet data found.     CHL IP ORAL PHASE 04/01/2017 Oral Phase Impaired Oral - Pudding Teaspoon -- Oral - Pudding Cup -- Oral - Honey Teaspoon -- Oral - Honey Cup -- Oral - Nectar Teaspoon -- Oral - Nectar Cup -- Oral - Nectar Straw -- Oral - Thin Teaspoon -- Oral - Thin Cup -- Oral - Thin Straw Impaired mastication;Weak lingual manipulation;Incomplete tongue to palate contact;Reduced posterior propulsion;Right pocketing in lateral sulci;Left pocketing in lateral sulci;Lingual/palatal residue;Piecemeal swallowing;Decreased velopharyngeal closure;Delayed oral transit;Decreased bolus cohesion Oral - Puree Impaired mastication;Weak lingual manipulation;Incomplete tongue to palate contact;Reduced posterior propulsion;Right pocketing in lateral sulci;Left pocketing in lateral sulci;Lingual/palatal residue;Piecemeal swallowing;Decreased velopharyngeal closure;Delayed oral transit;Decreased bolus cohesion Oral - Mech Soft -- Oral - Regular -- Oral - Multi-Consistency -- Oral - Pill -- Oral Phase - Comment --  CHL IP PHARYNGEAL PHASE 04/01/2017 Pharyngeal Phase Impaired Pharyngeal- Pudding Teaspoon -- Pharyngeal -- Pharyngeal- Pudding Cup -- Pharyngeal -- Pharyngeal- Honey Teaspoon -- Pharyngeal -- Pharyngeal- Honey Cup -- Pharyngeal -- Pharyngeal- Nectar Teaspoon -- Pharyngeal -- Pharyngeal- Nectar Cup -- Pharyngeal -- Pharyngeal- Nectar Straw -- Pharyngeal -- Pharyngeal- Thin Teaspoon -- Pharyngeal -- Pharyngeal- Thin Cup -- Pharyngeal -- Pharyngeal- Thin Straw Delayed swallow initiation-pyriform sinuses;Reduced epiglottic inversion;Reduced laryngeal elevation;Penetration/Aspiration before swallow;Compensatory strategies attempted (with notebox);Pharyngeal residue - valleculae Pharyngeal Material enters airway, remains ABOVE vocal cords then ejected out;Material enters airway, remains ABOVE vocal cords and not ejected out;Material enters airway, CONTACTS  cords and not ejected out Pharyngeal- Puree Delayed swallow initiation-vallecula;Reduced epiglottic inversion;Reduced laryngeal elevation;Reduced tongue base retraction;Pharyngeal residue - valleculae Pharyngeal -- Pharyngeal- Mechanical Soft -- Pharyngeal -- Pharyngeal- Regular -- Pharyngeal -- Pharyngeal- Multi-consistency -- Pharyngeal -- Pharyngeal- Pill -- Pharyngeal -- Pharyngeal Comment --  No flowsheet data found. No flowsheet data found. [name] [name] 04/01/2017, 3:46 PM              [place] Saline Soft Tissue Non Vascular  Result Date: 03/16/2017 CLINICAL DATA:  Evaluate for Baker cysts. Posterior left knee soft tissue swelling. EXAM: ULTRASOUND LEFT LOWER EXTREMITY LIMITED TECHNIQUE: Ultrasound examination of the lower extremity soft tissues was performed in the area of clinical concern. COMPARISON:  None. FINDINGS: No soft tissue mass. No cyst. Specifically, no evidence of a Baker's cyst. There is nonspecific soft tissue edema. IMPRESSION: 1. No mass or popliteal cyst. Electronically Signed   By: Lajean Manes M.D.   On: 03/16/2017 19:35   Ct  Extremity Lower Left W Contrast  Result Date: 03/30/2017 CLINICAL DATA:  Discoloration and worsening left leg pain. Leg edema. Negative Doppler. Question proximal DVT or fasciitis. EXAM: CT OF THE LOWER LEFT EXTREMITY WITH CONTRAST TECHNIQUE: Multidetector CT imaging of the lower left extremity was performed according to the standard protocol following intravenous contrast administration. COMPARISON:  Left lower extremity CT 03/19/2017 from the distal thigh through the ankle. CONTRAST:  180mL ISOVUE-370 IOPAMIDOL (ISOVUE-370) INJECTION 76% FINDINGS: Bones/Joint/Cartilage No acute osseous abnormality from the iliac crest through the foot. No periosteal reaction or bony destructive change. No hip, knee, or ankle joint effusion joint effusion. Ligaments Suboptimally assessed by CT. Muscles and Tendons No intramuscular fluid collection,  preservation of normal fatty striations. There is facial thickening of the posteromedial compartment of the calf without focal fluid collection. Quadriceps, patellar, and Achilles tendons are intact. Soft tissues No filling defects within the left common or external iliac veins. No visualized filling defects within the superficial femoral or popliteal veins, contrast bolus timing limits assessment. Evaluation of the saphenous vein including previous area of thrombus distal to the knee is not well assessed due to bolus timing. There is stranding about the distal saphenous vein at the ankle, similar to prior exam. Arterial vascular calcifications are seen, exam not tailored for arterial evaluation. Circumferential subcutaneous edema with skin thickening throughout the left lower extremity, most prominent distal to the knee, similar to prior exam. No soft tissue air. No drainable soft tissue fluid collection. IMPRESSION: 1. No filling defects within the left common or external iliac veins to suggest proximal DVT. 2. Superficial thrombophlebitis of the greater saphenous vein distal to the knee on prior exam is not well assessed currently due to bolus timing. Persistent stranding about the greater saphenous vein at the ankle. 3. Persistent subcutaneous tissue edema and skin thickening throughout the left lower extremity, most prominent distal to the knee, nonspecific. Mild facial thickening about the posteromedial calf without drainable fluid collection. Findings may be infectious or inflammatory. No focal fluid collection or soft tissue air. 4. Arterial calcifications, exam not tailored for detailed arterial evaluation. Electronically Signed   By: Jeb Levering M.D.   On: 03/30/2017 21:48   Ct Extremity Lower Left W Contrast  Result Date: 03/19/2017 CLINICAL DATA:  Left lower leg swelling and redness. Superficial thrombophlebitis diagnosed on prior left lower extremity ultrasound in September 2018. EXAM: CT OF  THE LOWER LEFT EXTREMITY WITH CONTRAST TECHNIQUE: Multidetector CT imaging of the lower left extremity was performed according to the standard protocol following intravenous contrast administration. COMPARISON:  Left lower extremity DVT ultrasound dated January 21, 2017. CONTRAST:  161mL ISOVUE-300 IOPAMIDOL (ISOVUE-300) INJECTION 61% FINDINGS: Bones/Joint/Cartilage No acute fracture or malalignment. Joint spaces are preserved. No knee or ankle joint effusion. Ligaments Suboptimally assessed by CT. Muscles and Tendons No focal abnormality.  Visualized tendons are intact. Soft tissues Diffuse, circumferential soft tissue swelling about the lower leg. No drainable fluid collection. Atherosclerotic vascular calcifications. The great saphenous vein appears occluded at the level of the ankle. Nonocclusive thrombus within the great saphenous vein below the knee. The great saphenous vein above the knee appears patent. IMPRESSION: 1. Diffuse, circumferential soft tissue swelling about the lower leg, nonspecific, but can be seen with cellulitis or superficial thrombophlebitis. No drainable fluid collection. 2. The great saphenous vein is patent above the knee, but contains nonocclusive thrombus below the knee, and appears occluded at the level of the ankle. Electronically Signed   By: Orville Govern.D.  On: 03/19/2017 12:56     Subjective: No coughing. Mild leg pain but feels swelling has improved.  Discharge Exam: Vitals:   04/04/17 0554 04/04/17 0951  BP: 113/64 109/60  Pulse: 71 64  Resp: 18 18  Temp: (!) 97.4 F (36.3 C) 97.6 F (36.4 C)  SpO2: 98% 100%   Vitals:   04/03/17 2030 04/04/17 0024 04/04/17 0554 04/04/17 0951  BP: 123/61 108/67 113/64 109/60  Pulse: 75 70 71 64  Resp: 18 18 18 18   Temp: 97.8 F (36.6 C) 97.6 F (36.4 C) (!) 97.4 F (36.3 C) 97.6 F (36.4 C)  TempSrc: Axillary Axillary Axillary Axillary  SpO2: 99% 98% 98% 100%  Weight:      Height:        General: Pt is  alert, awake, not in acute distress Cardiovascular: RRR, S1/S2 +, no rubs, no gallops Respiratory: CTA bilaterally, no wheezing, no rhonchi Abdominal: Soft, NT, ND, bowel sounds + Extremities: left leg with circumferential edema that is non-pitting. Leg is tight, normal temperature and color, no cyanosis    The results of significant diagnostics from this hospitalization (including imaging, microbiology, ancillary and laboratory) are listed below for reference.     Microbiology: Recent Results (from the past 240 hour(s))  Culture, blood (Routine X 2) w Reflex to ID Panel     Status: None   Collection Time: 03/30/17  4:20 PM  Result Value Ref Range Status   Specimen Description BLOOD RIGHT ANTECUBITAL  Final   Special Requests   Final    BOTTLES DRAWN AEROBIC AND ANAEROBIC Blood Culture adequate volume   Culture NO GROWTH 5 DAYS  Final   Report Status 04/04/2017 FINAL  Final  Culture, blood (Routine X 2) w Reflex to ID Panel     Status: None   Collection Time: 03/30/17  4:30 PM  Result Value Ref Range Status   Specimen Description BLOOD LEFT ANTECUBITAL  Final   Special Requests   Final    BOTTLES DRAWN AEROBIC AND ANAEROBIC Blood Culture adequate volume   Culture NO GROWTH 5 DAYS  Final   Report Status 04/04/2017 FINAL  Final     Labs: Basic Metabolic Panel: Recent Labs  Lab 03/30/17 1504 04/01/17 0314 04/02/17 1030  NA 128* 129* 130*  K 4.0 3.9 3.7  CL 98* 99* 98*  CO2 21* 22 25  GLUCOSE 96 90 129*  BUN 9 7 6   CREATININE 1.01 0.94 0.98  CALCIUM 8.5* 8.1* 8.4*   CBC: Recent Labs  Lab 03/30/17 1504 03/31/17 0808 04/01/17 0314 04/02/17 1030  WBC 6.8  --  4.6 4.3  NEUTROABS  --   --  3.1  --   HGB 6.4* 6.6* 8.2* 9.0*  HCT 21.1* 21.8* 25.4* 28.3*  MCV 89.0  --  87.3 89.0  PLT 300  --  215 257   Cardiac Enzymes: Recent Labs  Lab 03/30/17 1504  CKTOTAL 62   BNP: Invalid input(s): POCBNP CBG: Recent Labs  Lab 04/02/17 0554  GLUCAP 121*    Urinalysis    Component Value Date/Time   COLORURINE YELLOW 02/23/2014 [place] 02/23/2014 1707   LABSPEC 1.005 02/23/2014 1707   PHURINE 7.0 02/23/2014 1707   GLUCOSEU NEGATIVE 02/23/2014 1707   HGBUR NEGATIVE 02/23/2014 1707   BILIRUBINUR NEGATIVE 02/23/2014 1707   KETONESUR NEGATIVE 02/23/2014 1707   PROTEINUR NEGATIVE 02/23/2014 1707   UROBILINOGEN 0.2 02/23/2014 1707   NITRITE NEGATIVE 02/23/2014 1707   LEUKOCYTESUR NEGATIVE 02/23/2014 1707  Sepsis Labs Invalid input(s): PROCALCITONIN,  WBC,  LACTICIDVEN Microbiology Recent Results (from the past 240 hour(s))  Culture, blood (Routine X 2) w Reflex to ID Panel     Status: None   Collection Time: 03/30/17  4:20 PM  Result Value Ref Range Status   Specimen Description BLOOD RIGHT ANTECUBITAL  Final   Special Requests   Final    BOTTLES DRAWN AEROBIC AND ANAEROBIC Blood Culture adequate volume   Culture NO GROWTH 5 DAYS  Final   Report Status 04/04/2017 FINAL  Final  Culture, blood (Routine X 2) w Reflex to ID Panel     Status: None   Collection Time: 03/30/17  4:30 PM  Result Value Ref Range Status   Specimen Description BLOOD LEFT ANTECUBITAL  Final   Special Requests   Final    BOTTLES DRAWN AEROBIC AND ANAEROBIC Blood Culture adequate volume   Culture NO GROWTH 5 DAYS  Final   Report Status 04/04/2017 FINAL  Final     SIGNED:   Cordelia Poche, MD [facility] 04/04/2017, 1:32 PM Pager [phone]  If 7PM-7AM, please contact night-coverage [email or web address] Password [email or web address]

## 2017-04-08 ENCOUNTER — Inpatient Hospital Stay: Payer: Medicare Other | Admitting: Internal Medicine

## 2017-04-10 ENCOUNTER — Telehealth: Payer: Self-pay | Admitting: General Practice

## 2017-04-10 NOTE — Telephone Encounter (Signed)
Copied from [place] [phone]. Topic: Quick Communication - See Telephone Encounter >> Apr 10, 2017  1:35 PM Arletha Grippe wrote: CRM for notification. See Telephone encounter for:   04/10/17.[facility]   Verbal orders for home helath care -  1 week 1  2 week 2  1 week 6 3 prn

## 2017-04-10 NOTE — Telephone Encounter (Signed)
Per chart pt is scheduled to see MD on Mon 12/10. Will forward to his assistance to hold until appt...Trevor Griffin

## 2017-04-13 ENCOUNTER — Inpatient Hospital Stay: Payer: Medicare Other | Admitting: Internal Medicine

## 2017-04-16 ENCOUNTER — Other Ambulatory Visit (INDEPENDENT_AMBULATORY_CARE_PROVIDER_SITE_OTHER): Payer: Medicare Other

## 2017-04-16 ENCOUNTER — Telehealth: Payer: Self-pay | Admitting: Internal Medicine

## 2017-04-16 ENCOUNTER — Encounter: Payer: Self-pay | Admitting: Internal Medicine

## 2017-04-16 ENCOUNTER — Ambulatory Visit (INDEPENDENT_AMBULATORY_CARE_PROVIDER_SITE_OTHER): Payer: Medicare Other | Admitting: Internal Medicine

## 2017-04-16 VITALS — BP 130/80 | HR 85 | Temp 97.7°F | Resp 16 | Ht 68.0 in | Wt 181.8 lb

## 2017-04-16 DIAGNOSIS — M60862 Other myositis, left lower leg: Secondary | ICD-10-CM | POA: Diagnosis not present

## 2017-04-16 DIAGNOSIS — M159 Polyosteoarthritis, unspecified: Secondary | ICD-10-CM

## 2017-04-16 DIAGNOSIS — R739 Hyperglycemia, unspecified: Secondary | ICD-10-CM | POA: Insufficient documentation

## 2017-04-16 DIAGNOSIS — D508 Other iron deficiency anemias: Secondary | ICD-10-CM

## 2017-04-16 DIAGNOSIS — G893 Neoplasm related pain (acute) (chronic): Secondary | ICD-10-CM | POA: Diagnosis not present

## 2017-04-16 DIAGNOSIS — E039 Hypothyroidism, unspecified: Secondary | ICD-10-CM

## 2017-04-16 DIAGNOSIS — M150 Primary generalized (osteo)arthritis: Secondary | ICD-10-CM | POA: Diagnosis not present

## 2017-04-16 DIAGNOSIS — Z9861 Coronary angioplasty status: Secondary | ICD-10-CM | POA: Diagnosis not present

## 2017-04-16 DIAGNOSIS — Z23 Encounter for immunization: Secondary | ICD-10-CM | POA: Diagnosis not present

## 2017-04-16 DIAGNOSIS — J449 Chronic obstructive pulmonary disease, unspecified: Secondary | ICD-10-CM | POA: Diagnosis not present

## 2017-04-16 DIAGNOSIS — I2510 Atherosclerotic heart disease of native coronary artery without angina pectoris: Secondary | ICD-10-CM | POA: Diagnosis not present

## 2017-04-16 DIAGNOSIS — I255 Ischemic cardiomyopathy: Secondary | ICD-10-CM | POA: Diagnosis not present

## 2017-04-16 DIAGNOSIS — I10 Essential (primary) hypertension: Secondary | ICD-10-CM

## 2017-04-16 LAB — COMPREHENSIVE METABOLIC PANEL
ALBUMIN: 4.1 g/dL (ref 3.5–5.2)
ALK PHOS: 101 U/L (ref 39–117)
ALT: 11 U/L (ref 0–53)
AST: 16 U/L (ref 0–37)
BILIRUBIN TOTAL: 0.7 mg/dL (ref 0.2–1.2)
BUN: 6 mg/dL (ref 6–23)
CALCIUM: 9.4 mg/dL (ref 8.4–10.5)
CO2: 29 mEq/L (ref 19–32)
CREATININE: 0.75 mg/dL (ref 0.40–1.50)
Chloride: 97 mEq/L (ref 96–112)
GFR: 112.51 mL/min (ref >60.00)
Glucose, Bld: 77 mg/dL (ref 70–99)
Potassium: 4.3 mEq/L (ref 3.5–5.1)
Sodium: 133 mEq/L — ABNORMAL LOW (ref 135–145)
TOTAL PROTEIN: 7.8 g/dL (ref 6.0–8.3)

## 2017-04-16 LAB — CBC WITH DIFFERENTIAL/PLATELET
BASOS ABS: 0 10*3/uL (ref 0.0–0.1)
BASOS PCT: 1 % (ref 0.0–3.0)
EOS ABS: 0.1 10*3/uL (ref 0.0–0.7)
Eosinophils Relative: 1.7 % (ref 0.0–5.0)
HEMATOCRIT: 36.4 % — AB (ref 39.0–52.0)
HEMOGLOBIN: 11.9 g/dL — AB (ref 13.0–17.0)
LYMPHS PCT: 23 % (ref 12.0–46.0)
Lymphs Abs: 1.1 10*3/uL (ref 0.7–4.0)
MCHC: 32.6 g/dL (ref 30.0–36.0)
MCV: 91.2 fl (ref 78.0–100.0)
Monocytes Absolute: 0.7 10*3/uL (ref 0.1–1.0)
Monocytes Relative: 15.8 % — ABNORMAL HIGH (ref 3.0–12.0)
Neutro Abs: 2.8 10*3/uL (ref 1.4–7.7)
Neutrophils Relative %: 58.5 % (ref 43.0–77.0)
Platelets: 260 10*3/uL (ref 150.0–400.0)
RBC: 3.99 Mil/uL — AB (ref 4.22–5.81)
RDW: 21.9 % — AB (ref 11.5–15.5)
WBC: 4.7 10*3/uL (ref 4.0–10.5)

## 2017-04-16 LAB — IBC PANEL
Iron: 58 ug/dL (ref 42–165)
Saturation Ratios: 15.2 % — ABNORMAL LOW (ref 20.0–50.0)
TRANSFERRIN: 273 mg/dL (ref 212.0–360.0)

## 2017-04-16 LAB — HEMOGLOBIN A1C: Hgb A1c MFr Bld: 4.2 % — ABNORMAL LOW (ref 4.6–6.5)

## 2017-04-16 LAB — TSH: TSH: 15.68 u[IU]/mL — AB (ref 0.35–4.50)

## 2017-04-16 LAB — FERRITIN: Ferritin: 199.6 ng/mL (ref 22.0–322.0)

## 2017-04-16 MED ORDER — GLYCOPYRROLATE 25 MCG/ML IN SOLN: 1.0000 | Freq: Two times a day (BID) | RESPIRATORY_TRACT | 11 refills | Status: DC

## 2017-04-16 MED ORDER — METOPROLOL TARTRATE 25 MG PO TABS: 12.5000 mg | ORAL_TABLET | Freq: Two times a day (BID) | ORAL | 1 refills | Status: AC

## 2017-04-16 MED ORDER — ENSURE ENLIVE PO LIQD: 1.0000 | Freq: Four times a day (QID) | ORAL | 11 refills | Status: AC

## 2017-04-16 MED ORDER — GLYCOPYRROLATE 25 MCG/ML IN SOLN: 1.0000 | Freq: Two times a day (BID) | RESPIRATORY_TRACT | 1 refills | Status: DC

## 2017-04-16 MED ORDER — OXYCODONE HCL 5 MG PO TABS: 5.0000 mg | ORAL_TABLET | Freq: Four times a day (QID) | ORAL | 0 refills | Status: DC | PRN

## 2017-04-16 NOTE — Patient Instructions (Signed)
Cough, Adult Coughing is a reflex that clears your throat and your airways. Coughing helps to heal and protect your lungs. It is normal to cough occasionally, but a cough that happens with other symptoms or lasts a long time may be a sign of a condition that needs treatment. A cough may last only 2-3 weeks (acute), or it may last longer than 8 weeks (chronic). What are the causes? Coughing is commonly caused by:  Breathing in substances that irritate your lungs.  A viral or bacterial respiratory infection.  Allergies.  Asthma.  Postnasal drip.  Smoking.  Acid backing up from the stomach into the esophagus (gastroesophageal reflux).  Certain medicines.  Chronic lung problems, including COPD (or rarely, lung cancer).  Other medical conditions such as heart failure.  Follow these instructions at home: Pay attention to any changes in your symptoms. Take these actions to help with your discomfort:  Take medicines only as told by your health care provider. ? If you were prescribed an antibiotic medicine, take it as told by your health care provider. Do not stop taking the antibiotic even if you start to feel better. ? Talk with your health care provider before you take a cough suppressant medicine.  Drink enough fluid to keep your urine clear or pale yellow.  If the air is dry, use a cold steam vaporizer or humidifier in your bedroom or your home to help loosen secretions.  Avoid anything that causes you to cough at work or at home.  If your cough is worse at night, try sleeping in a semi-upright position.  Avoid cigarette smoke. If you smoke, quit smoking. If you need help quitting, ask your health care provider.  Avoid caffeine.  Avoid alcohol.  Rest as needed.  Contact a health care provider if:  You have new symptoms.  You cough up pus.  Your cough does not get better after 2-3 weeks, or your cough gets worse.  You cannot control your cough with suppressant  medicines and you are losing sleep.  You develop pain that is getting worse or pain that is not controlled with pain medicines.  You have a fever.  You have unexplained weight loss.  You have night sweats. Get help right away if:  You cough up blood.  You have difficulty breathing.  Your heartbeat is very fast. This information is not intended to replace advice given to you by your health care provider. Make sure you discuss any questions you have with your health care provider. Document Released: 10/18/2010 Document Revised: 09/27/2015 Document Reviewed: 06/28/2014 Elsevier Interactive Patient Education  2017 [employer]  

## 2017-04-16 NOTE — Telephone Encounter (Signed)
Patient has a appointment today with Dr.Jones at 2pm.

## 2017-04-16 NOTE — Progress Notes (Signed)
Subjective:  Patient ID: Trevor Griffin, male    DOB: 23-Oct-1955  Age: 61 y.o. MRN: [id number]  CC: Cough; Anemia; and Hypothyroidism   HPI Trevor Griffin presents for f/up - he was recently admitted for lower extremity pain that was thoroughly evaluated and he was found to have an area of myositis.  He says in some ways it is better but there is persistent discomfort in the area over his left medial and posterior calf that is well controlled with oxycodone.  He was also found to have an abnormal CT scan of his lungs with several nodules, inflammatory changes, and lymphadenopathy.  He has a history of tobacco abuse and his wife tells me he still smokes cigars.  He has had a chronic, worsening cough over the last year with shortness of breath and DOE.  He says the cough is nonproductive and he denies hemoptysis, chest pain, night sweats, fever or chills.  He does complain of weight loss.  Status post a significant oral and neck surgery.  He has postsurgical hypothyroidism.  Upon admission he was found to have an elevated TSH.  He tells me he has been compliant with the current dose of levothyroxine.  He has swallowing difficulties and underwent a swallowing test and was recommended that he have a mechanical soft diet which his wife is working on providing for him.  Outpatient Medications Prior to Visit  Medication Sig Dispense Refill  . aspirin 81 MG tablet Take 1 tablet (81 mg total) by mouth daily. (Patient taking differently: Take 162 mg 2 (two) times daily by mouth. ) 30 tablet 1  . atorvastatin (LIPITOR) 80 MG tablet Take 1 tablet (80 mg total) by mouth daily. 30 tablet 6  . B Complex-C (B-COMPLEX WITH VITAMIN C) tablet Take 1 tablet daily by mouth. 30 tablet 0  . buPROPion (WELLBUTRIN SR) 150 MG 12 hr tablet Take 1 tablet (150 mg total) by mouth 2 (two) times daily. 60 tablet 1  . esomeprazole (NEXIUM) 20 MG capsule Take 20 mg by mouth daily.     . ferrous sulfate 325 (65 FE) MG tablet Take  1 tablet (325 mg total) 3 (three) times daily with meals by mouth. 90 tablet 0  . Multiple Vitamins-Minerals (MULTIVITAMIN WITH MINERALS) tablet Take 1 tablet by mouth daily.      . feeding supplement, ENSURE ENLIVE, (ENSURE ENLIVE) LIQD Take 237 mLs by mouth 4 (four) times daily. 40 Bottle 0  . levothyroxine (SYNTHROID, LEVOTHROID) 100 MCG tablet Take 1 tablet (100 mcg total) by mouth daily before breakfast. 30 tablet 3  . metoprolol tartrate (LOPRESSOR) 25 MG tablet Take 0.5 tablets (12.5 mg total) by mouth 2 (two) times daily. 30 tablet 6  . oxyCODONE (OXY IR/ROXICODONE) 5 MG immediate release tablet Take 1 tablet (5 mg total) by mouth every 4 (four) hours as needed for severe pain. 20 tablet 0  . nitroGLYCERIN (NITROSTAT) 0.4 MG SL tablet Place 1 tablet (0.4 mg total) under the tongue every 5 (five) minutes as needed (up to 3 doses). For chest pain (Patient not taking: Reported on 04/16/2017) 25 tablet 3  . guaiFENesin (MUCINEX) 600 MG 12 hr tablet Take 1 tablet (600 mg total) 2 (two) times daily by mouth. 60 tablet 0  . methocarbamol (ROBAXIN) 500 MG tablet Take 1 tablet (500 mg total) every 8 (eight) hours as needed by mouth for muscle spasms. 30 tablet 0  . ondansetron (ZOFRAN-ODT) 4 MG disintegrating tablet Take 1 tablet (4  mg total) by mouth every 8 (eight) hours as needed for nausea or vomiting. 20 tablet 0  . polyethylene glycol (MIRALAX / GLYCOLAX) packet Take 17 g daily by mouth. 14 each 0  . senna-docusate (SENOKOT-S) 8.6-50 MG tablet Take 1 tablet 2 (two) times daily by mouth. 60 tablet 0   No facility-administered medications prior to visit.     ROS Review of Systems  Constitutional: Positive for fatigue and unexpected weight change. Negative for activity change, appetite change, chills, diaphoresis and fever.  HENT: Positive for trouble swallowing. Negative for facial swelling, sore throat and voice change.   Respiratory: Positive for cough and shortness of breath. Negative for  chest tightness, wheezing and stridor.   Cardiovascular: Positive for leg swelling. Negative for chest pain and palpitations.  Gastrointestinal: Negative for abdominal pain, constipation, diarrhea, nausea and vomiting.  Endocrine: Negative for cold intolerance, heat intolerance, polydipsia, polyphagia and polyuria.  Genitourinary: Negative for decreased urine volume, difficulty urinating, dysuria, hematuria and urgency.  Musculoskeletal: Positive for arthralgias and neck pain. Negative for back pain and myalgias.  Skin: Negative.  Negative for color change, pallor and rash.  Neurological: Negative.  Negative for dizziness, weakness, light-headedness and numbness.  Hematological: Negative for adenopathy. Does not bruise/bleed easily.  Psychiatric/Behavioral: Negative.     Objective:  BP 130/80 (BP Location: Left Arm, Patient Position: Sitting, Cuff Size: Normal)   Pulse 85   Temp 97.7 F (36.5 C) (Oral)   Resp 16   Ht 5' 8"  (1.727 m)   Wt 181 lb 12 oz (82.4 kg)   SpO2 98%   BMI 27.63 kg/m   BP Readings from Last 3 Encounters:  04/16/17 130/80  04/04/17 116/81  03/22/17 110/63    Wt Readings from Last 3 Encounters:  04/16/17 181 lb 12 oz (82.4 kg)  03/31/17 192 lb 0.3 oz (87.1 kg)  03/22/17 198 lb 6.6 oz (90 kg)    Physical Exam  Constitutional: He is oriented to person, place, and time. No distress.  HENT:  Mouth/Throat: No oropharyngeal exudate.  Eyes: Conjunctivae are normal. Left eye exhibits no discharge. No scleral icterus.  Neck: Normal range of motion. Neck supple. No JVD present. No thyromegaly present.  Cardiovascular: Normal rate, regular rhythm and normal heart sounds.  Pulmonary/Chest: Effort normal and breath sounds normal. No respiratory distress. He has no wheezes. He has no rales.  Abdominal: Soft. Bowel sounds are normal. He exhibits no mass. There is no guarding.  Musculoskeletal: Normal range of motion. He exhibits no edema, tenderness or deformity.    LLE shows a large, confluent area of indurated skin that extends over the left medial calf from the popliteal fossa down to the ankle.  There is no warmth or erythema.  There is no pitting edema in the lower extremity.  Lymphadenopathy:    He has no cervical adenopathy.  Neurological: He is alert and oriented to person, place, and time.  Skin: Skin is warm and dry. No rash noted. He is not diaphoretic. No erythema. No pallor.  Vitals reviewed.   Lab Results  Component Value Date   WBC 4.7 04/16/2017   HGB 11.9 (L) 04/16/2017   HCT 36.4 (L) 04/16/2017   PLT 260.0 04/16/2017   GLUCOSE 77 04/16/2017   CHOL 127 03/04/2017   TRIG 56 03/04/2017   HDL 35 (L) 03/04/2017   LDLDIRECT 148.4 10/18/2009   LDLCALC 81 03/04/2017   ALT 11 04/16/2017   AST 16 04/16/2017   NA 133 (L) 04/16/2017  K 4.3 04/16/2017   CL 97 04/16/2017   CREATININE 0.75 04/16/2017   BUN 6 04/16/2017   CO2 29 04/16/2017   TSH 15.68 (H) 04/16/2017   INR 1.24 03/18/2017   HGBA1C 4.2 (L) 04/16/2017    Ct Angio Chest Pe W And/or Wo Contrast  Result Date: 03/30/2017 CLINICAL DATA:  Short of breath and chest pain EXAM: CT ANGIOGRAPHY CHEST WITH CONTRAST TECHNIQUE: Multidetector CT imaging of the chest was performed using the standard protocol during bolus administration of intravenous contrast. Multiplanar CT image reconstructions and MIPs were obtained to evaluate the vascular anatomy. CONTRAST:  138m ISOVUE-370 IOPAMIDOL (ISOVUE-370) INJECTION 76% COMPARISON:  03/16/2017 FINDINGS: Cardiovascular: There are no filling defects in the pulmonary arterial tree to suggest acute pulmonary thromboembolism. Atherosclerotic calcifications in the thoracic aorta are noted. There is no evidence of dissection or aortic aneurysm. Three vessel coronary artery calcifications are noted. Mediastinum/Nodes: 14 mm right paratracheal short axis diameter lymph node on image 41. Several other scattered smaller lymph nodes are present in the  mediastinum. There is mildly prominent peribronchovascular soft tissue in the hilar regions. Lungs/Pleura: Loculated pleural fluid is seen surrounding the right lung. It is present at the posterior right lung base and around the right lung apex. Extensive ground-glass opacification and consolidation is present within the right upper lobe a more right middle lobe, and superior segment of the right lower lobe. Ground-glass opacities and nodules in the left upper lobe have slightly improved. Largest nodule on the left measures 8 mm on image 56. Nodular densities at the posterior left lung base have also developed. There are metallic densities inferior to the right hilum and in the right lower lobe laterally compatible with postoperative changes. Upper Abdomen: Gallstones are present. Liver is unremarkable. Visualized spleen is unremarkable. Splenule is noted. Musculoskeletal: No vertebral compression deformity. Review of the MIP images confirms the above findings. IMPRESSION: No evidence of acute pulmonary thromboembolism. Pulmonary parenchymal disease has worsened, particularly throughout the right lung. This characterize by consolidation and ground-glass opacities. Findings are most consistent with a progressive inflammatory process. Mediastinal adenopathy has increased since the prior study. Differential diagnosis includes both inflammatory and neoplastic etiology. There are nodules in the left lung, and the largest in the left upper lobe measures 8 mm. Non-contrast chest CT at 3-6 months is recommended. If the nodules are stable at time of repeat CT, then future CT at 18-24 months (from today's scan) is considered optional for low-risk patients, but is recommended for high-risk patients. This recommendation follows the consensus statement: Guidelines for Management of Incidental Pulmonary Nodules Detected on CT Images: From the Fleischner Society 2017; Radiology 2017; 284:228-243. Loculated right pleural effusion  has increased in volume. Cholelithiasis. Aortic Atherosclerosis (ICD10-I70.0). Electronically Signed   By: AMarybelle KillingsM.D.   On: 03/30/2017 18:56   Ct Extremity Lower Left W Contrast  Result Date: 03/30/2017 CLINICAL DATA:  Discoloration and worsening left leg pain. Leg edema. Negative Doppler. Question proximal DVT or fasciitis. EXAM: CT OF THE LOWER LEFT EXTREMITY WITH CONTRAST TECHNIQUE: Multidetector CT imaging of the lower left extremity was performed according to the standard protocol following intravenous contrast administration. COMPARISON:  Left lower extremity CT 03/19/2017 from the distal thigh through the ankle. CONTRAST:  1065mISOVUE-370 IOPAMIDOL (ISOVUE-370) INJECTION 76% FINDINGS: Bones/Joint/Cartilage No acute osseous abnormality from the iliac crest through the foot. No periosteal reaction or bony destructive change. No hip, knee, or ankle joint effusion joint effusion. Ligaments Suboptimally assessed by CT. Muscles and Tendons No  intramuscular fluid collection, preservation of normal fatty striations. There is facial thickening of the posteromedial compartment of the calf without focal fluid collection. Quadriceps, patellar, and Achilles tendons are intact. Soft tissues No filling defects within the left common or external iliac veins. No visualized filling defects within the superficial femoral or popliteal veins, contrast bolus timing limits assessment. Evaluation of the saphenous vein including previous area of thrombus distal to the knee is not well assessed due to bolus timing. There is stranding about the distal saphenous vein at the ankle, similar to prior exam. Arterial vascular calcifications are seen, exam not tailored for arterial evaluation. Circumferential subcutaneous edema with skin thickening throughout the left lower extremity, most prominent distal to the knee, similar to prior exam. No soft tissue air. No drainable soft tissue fluid collection. IMPRESSION: 1. No filling  defects within the left common or external iliac veins to suggest proximal DVT. 2. Superficial thrombophlebitis of the greater saphenous vein distal to the knee on prior exam is not well assessed currently due to bolus timing. Persistent stranding about the greater saphenous vein at the ankle. 3. Persistent subcutaneous tissue edema and skin thickening throughout the left lower extremity, most prominent distal to the knee, nonspecific. Mild facial thickening about the posteromedial calf without drainable fluid collection. Findings may be infectious or inflammatory. No focal fluid collection or soft tissue air. 4. Arterial calcifications, exam not tailored for detailed arterial evaluation. Electronically Signed   By: Jeb Levering M.D.   On: 03/30/2017 21:48    Assessment & Plan:   Trevor Griffin was seen today for cough, anemia and hypothyroidism.  Diagnoses and all orders for this visit:  Essential hypertension- His blood pressure is well controlled. -     Comprehensive metabolic panel; Future -     metoprolol tartrate (LOPRESSOR) 25 MG tablet; Take 0.5 tablets (12.5 mg total) by mouth 2 (two) times daily.  Acquired hypothyroidism- His TSH remains mildly elevated.  I will increase his levothyroxine dose 212 mcg. -     TSH; Future -     levothyroxine (SYNTHROID, LEVOTHROID) 112 MCG tablet; Take 1 tablet (112 mcg total) by mouth daily.  Primary osteoarthritis involving multiple joints -     oxyCODONE (OXY IR/ROXICODONE) 5 MG immediate release tablet; Take 1 tablet (5 mg total) by mouth every 6 (six) hours as needed for severe pain.  Other iron deficiency anemia-his H&H have improved and his iron level is improving.  I have asked him to continue taking the iron replacement therapy. -     CBC with Differential/Platelet; Future -     Ferritin; Future -     IBC panel; Future  Hyperglycemia- His blood sugars are normal now. -     Hemoglobin A1c; Future  Myositis of left lower leg, unspecified  myositis type -     oxyCODONE (OXY IR/ROXICODONE) 5 MG immediate release tablet; Take 1 tablet (5 mg total) by mouth every 6 (six) hours as needed for severe pain.  Cancer associated pain -     oxyCODONE (OXY IR/ROXICODONE) 5 MG immediate release tablet; Take 1 tablet (5 mg total) by mouth every 6 (six) hours as needed for severe pain.  CAD S/P multiple PCIs- He has had no recent episodes of chest pain. -     metoprolol tartrate (LOPRESSOR) 25 MG tablet; Take 0.5 tablets (12.5 mg total) by mouth 2 (two) times daily.  Cardiomyopathy, ischemic-EF 45-50% by 2D Oct 2015-he has normal volume status.  I do not think his  current shortness of breath is related to heart failure but more likely is related to COPD. -     metoprolol tartrate (LOPRESSOR) 25 MG tablet; Take 0.5 tablets (12.5 mg total) by mouth 2 (two) times daily.  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  I have asked him to follow-up with pulmonary to discuss treatment of his COPD as well as the recent abnormal findings on his CT scan. -     Glycopyrrolate (LONHALA MAGNAIR REFILL KIT) 25 MCG/ML SOLN; Inhale 1 Act into the lungs 2 (two) times daily. -     Glycopyrrolate (LONHALA MAGNAIR STARTER KIT) 25 MCG/ML SOLN; Inhale 1 Act into the lungs 2 (two) times daily. -     Ambulatory referral to Pulmonology  Need for influenza vaccination -     Flu Vaccine QUAD 36+ mos IM  Need for pneumococcal vaccination -     Pneumococcal conjugate vaccine 13-valent  Other orders -     feeding supplement, ENSURE ENLIVE, (ENSURE ENLIVE) LIQD; Take 237 mLs by mouth 4 (four) times daily.   I have discontinued Hunt Oris. Cherne's levothyroxine, guaiFENesin, methocarbamol, polyethylene glycol, senna-docusate, and ondansetron. I have also changed his oxyCODONE. Additionally, I am having him start on Glycopyrrolate, Glycopyrrolate, and levothyroxine. Lastly, I am having him  maintain his multivitamin with minerals, aspirin, atorvastatin, buPROPion, nitroGLYCERIN, esomeprazole, ferrous sulfate, B-complex with vitamin C, metoprolol tartrate, and feeding supplement (ENSURE ENLIVE).  Meds ordered this encounter  Medications  . oxyCODONE (OXY IR/ROXICODONE) 5 MG immediate release tablet    Sig: Take 1 tablet (5 mg total) by mouth every 6 (six) hours as needed for severe pain.    Dispense:  100 tablet    Refill:  0  . metoprolol tartrate (LOPRESSOR) 25 MG tablet    Sig: Take 0.5 tablets (12.5 mg total) by mouth 2 (two) times daily.    Dispense:  180 tablet    Refill:  1  . Glycopyrrolate (LONHALA MAGNAIR REFILL KIT) 25 MCG/ML SOLN    Sig: Inhale 1 Act into the lungs 2 (two) times daily.    Dispense:  60 mL    Refill:  11  . Glycopyrrolate (LONHALA MAGNAIR STARTER KIT) 25 MCG/ML SOLN    Sig: Inhale 1 Act into the lungs 2 (two) times daily.    Dispense:  60 mL    Refill:  1  . feeding supplement, ENSURE ENLIVE, (ENSURE ENLIVE) LIQD    Sig: Take 237 mLs by mouth 4 (four) times daily.    Dispense:  120 Bottle    Refill:  11  . levothyroxine (SYNTHROID, LEVOTHROID) 112 MCG tablet    Sig: Take 1 tablet (112 mcg total) by mouth daily.    Dispense:  90 tablet    Refill:  0     Follow-up: Return in about 4 months (around 08/15/2017).  Scarlette Calico, MD

## 2017-04-16 NOTE — Telephone Encounter (Signed)
Copied from [place]. Topic: Quick Communication - See Telephone Encounter >> Apr 16, 2017 11:56 AM Cleaster Corin, NT wrote: CRM for notification. See Telephone encounter for:   04/16/17. [facility] Sn[facility] from [facility] called to get verbal orders for pt at home.  PT evaluation for [place]ady 2 week 1 1 week 6  Daleen Snook can be reached at [phone]

## 2017-04-17 ENCOUNTER — Telehealth: Payer: Self-pay

## 2017-04-17 ENCOUNTER — Encounter: Payer: Self-pay | Admitting: Hematology and Oncology

## 2017-04-17 ENCOUNTER — Other Ambulatory Visit: Payer: Medicare Other

## 2017-04-17 ENCOUNTER — Telehealth: Payer: Self-pay | Admitting: Internal Medicine

## 2017-04-17 ENCOUNTER — Ambulatory Visit: Payer: Medicare Other | Admitting: Hematology and Oncology

## 2017-04-17 MED ORDER — LEVOTHYROXINE SODIUM 112 MCG PO TABS: 112.0000 ug | ORAL_TABLET | Freq: Every day | ORAL | 0 refills | Status: DC

## 2017-04-17 MED ORDER — FERROUS SULFATE 325 (65 FE) MG PO TABS: 325.0000 mg | ORAL_TABLET | Freq: Three times a day (TID) | ORAL | 4 refills | Status: DC

## 2017-04-17 NOTE — Telephone Encounter (Signed)
Sent message directly to [facility] with [facility]

## 2017-04-17 NOTE — Telephone Encounter (Signed)
Copied from [place]. Topic: General - Other >> Apr 17, 2017  3:30 PM Synthia Innocent wrote: Reason for CRM: [facility] states [facility] don't carry Strawberry ensure. Please advise

## 2017-04-17 NOTE — Telephone Encounter (Signed)
-----   Message from Darlina Guys sent at 04/17/2017  4:55 PM EST ----- Regarding: RE: ensure Hey Laylanie Kruczek. We don't carry Ensure Enlive at all.  We carry regular Ensure Vanilla flavor only.  If the patient wants the regular Ensure, it is a private pay item so he can just bring his prescription into one of our stores. Hope this helps. Melissa  ----- Message ----- From: Johney Frame, Adelina Mings, CMA Sent: 04/17/2017   4:44 PM To: Melissa Stenson Subject: ensure                                         Any flavor is fine. Strawberry is the patients favorite. Dr. Ronnald Ramp is okay with any flavor. Do I need to refax with a different flavor?

## 2017-04-17 NOTE — Telephone Encounter (Signed)
Verbal okay given for [facility] care as requested.

## 2017-04-17 NOTE — Telephone Encounter (Signed)
PT verbal orders given as requested.

## 2017-04-20 ENCOUNTER — Encounter: Payer: Self-pay | Admitting: *Deleted

## 2017-04-20 NOTE — Telephone Encounter (Signed)
Pt spouse informed that the Ensure is a private pay item. She stated that "that isn't right". Spouse stated that she would call us back and let us know what or how it should be written to get insurance to cover.

## 2017-04-29 ENCOUNTER — Institutional Professional Consult (permissible substitution): Payer: Medicare Other | Admitting: Internal Medicine

## 2017-05-01 ENCOUNTER — Telehealth: Payer: Self-pay | Admitting: Internal Medicine

## 2017-05-01 NOTE — Telephone Encounter (Signed)
Trevor Griffin: [place]  [phone]  Reporting concerns from patient visit: Patient reports feeling ill whole week- not getting better." Feels sick"- chills, productive cough- thick yellow sputum. Last night woke with chest pain- did not feel heart related- felt worse with deep breaths in - lasted about 2 hours while laying in bed- but subsided when got up- has since resolved. Today during assessment- afebrile O2 sat 97% on room air- HR 88 -RR 18 even unlabored- Lungs: clear up top- right base undiminished. No weight changes and ankles are measuring the same as 1 week ago.  Patient number- [phone]

## 2017-05-04 NOTE — Telephone Encounter (Signed)
Pt was not able to come in today.  Pt has been scheduled for 05/06/2017

## 2017-05-04 NOTE — Telephone Encounter (Signed)
pls get him in for a CXR and OV

## 2017-05-06 ENCOUNTER — Ambulatory Visit: Payer: Medicare Other | Admitting: Internal Medicine

## 2017-05-06 ENCOUNTER — Telehealth: Payer: Self-pay | Admitting: Internal Medicine

## 2017-05-06 NOTE — Telephone Encounter (Signed)
Copied from [place] [id number]. Topic: Quick Communication - Appointment Cancellation >> May 06, 2017 10:28 AM Trevor Griffin, NT wrote: Patient wife called and cancelled appointment for today @ 3:15 with Dr. Scarlette Calico. Pt wife is unable to drive and he is going to reschedule.    Route to department's PEC pool.

## 2017-05-13 ENCOUNTER — Ambulatory Visit (INDEPENDENT_AMBULATORY_CARE_PROVIDER_SITE_OTHER): Payer: Medicare Other | Admitting: Internal Medicine

## 2017-05-13 ENCOUNTER — Ambulatory Visit (INDEPENDENT_AMBULATORY_CARE_PROVIDER_SITE_OTHER)
Admission: RE | Admit: 2017-05-13 | Discharge: 2017-05-13 | Disposition: A | Discharge status: Home / Self Care | Payer: Medicare Other | Source: Ambulatory Visit | Attending: Internal Medicine | Admitting: Internal Medicine

## 2017-05-13 ENCOUNTER — Encounter: Payer: Self-pay | Admitting: Internal Medicine

## 2017-05-13 VITALS — BP 124/80 | HR 78 | Temp 97.6°F | Resp 16 | Ht 68.0 in | Wt 181.5 lb

## 2017-05-13 DIAGNOSIS — R059 Cough, unspecified: Secondary | ICD-10-CM

## 2017-05-13 DIAGNOSIS — J988 Other specified respiratory disorders: Secondary | ICD-10-CM

## 2017-05-13 DIAGNOSIS — R05 Cough: Secondary | ICD-10-CM

## 2017-05-13 MED ORDER — CEFDINIR 300 MG PO CAPS: 300.0000 mg | ORAL_CAPSULE | Freq: Two times a day (BID) | ORAL | 0 refills | Status: DC

## 2017-05-13 NOTE — Progress Notes (Signed)
 Subjective:  Patient ID: Trevor Griffin, male    DOB: 06/15/1955  Age: 61 y.o. MRN: [id number]  CC: Cough   HPI Trevor Griffin presents for a one-week history of cough that is productive of thick yellow phlegm.  Outpatient Medications Prior to Visit  Medication Sig Dispense Refill  . aspirin 81 MG tablet Take 1 tablet (81 mg total) by mouth daily. (Patient taking differently: Take 162 mg 2 (two) times daily by mouth. ) 30 tablet 1  . atorvastatin (LIPITOR) 80 MG tablet Take 1 tablet (80 mg total) by mouth daily. 30 tablet 6  . B Complex-C (B-COMPLEX WITH VITAMIN C) tablet Take 1 tablet daily by mouth. 30 tablet 0  . buPROPion (WELLBUTRIN SR) 150 MG 12 hr tablet Take 1 tablet (150 mg total) by mouth 2 (two) times daily. 60 tablet 1  . esomeprazole (NEXIUM) 20 MG capsule Take 20 mg by mouth daily.     . feeding supplement, ENSURE ENLIVE, (ENSURE ENLIVE) LIQD Take 237 mLs by mouth 4 (four) times daily. 120 Bottle 11  . ferrous sulfate 325 (65 FE) MG tablet Take 1 tablet (325 mg total) by mouth 3 (three) times daily with meals. 90 tablet 4  . Glycopyrrolate (LONHALA MAGNAIR REFILL KIT) 25 MCG/ML SOLN Inhale 1 Act into the lungs 2 (two) times daily. 60 mL 11  . levothyroxine (SYNTHROID, LEVOTHROID) 112 MCG tablet Take 1 tablet (112 mcg total) by mouth daily. 90 tablet 0  . metoprolol tartrate (LOPRESSOR) 25 MG tablet Take 0.5 tablets (12.5 mg total) by mouth 2 (two) times daily. 180 tablet 1  . Multiple Vitamins-Minerals (MULTIVITAMIN WITH MINERALS) tablet Take 1 tablet by mouth daily.      . oxyCODONE (OXY IR/ROXICODONE) 5 MG immediate release tablet Take 1 tablet (5 mg total) by mouth every 6 (six) hours as needed for severe pain. 100 tablet 0  . nitroGLYCERIN (NITROSTAT) 0.4 MG SL tablet Place 1 tablet (0.4 mg total) under the tongue every 5 (five) minutes as needed (up to 3 doses). For chest pain (Patient not taking: Reported on 04/16/2017) 25 tablet 3  . Glycopyrrolate (LONHALA MAGNAIR  STARTER KIT) 25 MCG/ML SOLN Inhale 1 Act into the lungs 2 (two) times daily. 60 mL 1   No facility-administered medications prior to visit.     ROS Review of Systems  Constitutional: Negative for appetite change, chills, diaphoresis, fatigue and fever.  HENT: Negative.  Negative for facial swelling, sinus pressure, sore throat and trouble swallowing.   Eyes: Negative.   Respiratory: Positive for cough. Negative for chest tightness, shortness of breath and wheezing.   Cardiovascular: Negative for chest pain, palpitations and leg swelling.  Gastrointestinal: Negative for abdominal pain, constipation, diarrhea, nausea and vomiting.  Endocrine: Negative.   Genitourinary: Negative.   Musculoskeletal: Negative.  Negative for back pain and myalgias.  Skin: Negative.  Negative for rash.  Allergic/Immunologic: Negative.   Neurological: Negative.  Negative for dizziness and headaches.  Hematological: Negative for adenopathy. Does not bruise/bleed easily.  Psychiatric/Behavioral: Negative.     Objective:  BP 124/80 (BP Location: Right Arm, Patient Position: Sitting, Cuff Size: Normal)   Pulse 78   Temp 97.6 F (36.4 C) (Oral)   Resp 16   Ht 5' 8" (1.727 m)   Wt 181 lb 8 oz (82.3 kg)   SpO2 97%   BMI 27.60 kg/m   BP Readings from Last 3 Encounters:  05/13/17 124/80  04/16/17 130/80  04/04/17 116/81    Wt   Readings from Last 3 Encounters:  05/13/17 181 lb 8 oz (82.3 kg)  04/16/17 181 lb 12 oz (82.4 kg)  03/31/17 192 lb 0.3 oz (87.1 kg)    Physical Exam  Constitutional: He is oriented to person, place, and time.  Non-toxic appearance. He does not have a sickly appearance. He does not appear ill. No distress.  HENT:  Mouth/Throat: Oropharynx is clear and moist. No oropharyngeal exudate.  Eyes: Conjunctivae are normal. Left eye exhibits no discharge. No scleral icterus.  Neck: Normal range of motion. Neck supple. No JVD present. No thyromegaly present.  Cardiovascular: Normal  rate, regular rhythm and normal heart sounds.  No murmur heard. Pulmonary/Chest: Effort normal and breath sounds normal. No accessory muscle usage. No tachypnea. No respiratory distress. He has no decreased breath sounds. He has no wheezes. He has no rhonchi. He has no rales.  Abdominal: Soft. Bowel sounds are normal. He exhibits no distension and no mass. There is no tenderness.  Musculoskeletal: Normal range of motion. He exhibits no edema or tenderness.  Neurological: He is alert and oriented to person, place, and time.  Skin: Skin is warm and dry. No rash noted. He is not diaphoretic. No erythema. No pallor.  Vitals reviewed.   Lab Results  Component Value Date   WBC 4.7 04/16/2017   HGB 11.9 (L) 04/16/2017   HCT 36.4 (L) 04/16/2017   PLT 260.0 04/16/2017   GLUCOSE 77 04/16/2017   CHOL 127 03/04/2017   TRIG 56 03/04/2017   HDL 35 (L) 03/04/2017   LDLDIRECT 148.4 10/18/2009   LDLCALC 81 03/04/2017   ALT 11 04/16/2017   AST 16 04/16/2017   NA 133 (L) 04/16/2017   K 4.3 04/16/2017   CL 97 04/16/2017   CREATININE 0.75 04/16/2017   BUN 6 04/16/2017   CO2 29 04/16/2017   TSH 15.68 (H) 04/16/2017   INR 1.24 03/18/2017   HGBA1C 4.2 (L) 04/16/2017    Dg Chest 2 View  Result Date: 05/13/2017 CLINICAL DATA:  Cough EXAM: CHEST  2 VIEW COMPARISON:  CT of 03/30/2017.  Chest x-ray 11/28/2015 FINDINGS: Heart size upper normal. Negative for heart failure. Left lung clear Postsurgical changes and lobectomy on the right. Blunting right costophrenic angle unchanged. Right lower lobe scarring unchanged. Right apical pleural thickening unchanged. These findings most compatible with scarring. Left lung clear. IMPRESSION: Stable appearance of the chest. Postsurgical changes and scarring in the right lung. Electronically Signed   By: Franchot Gallo M.D.   On: 05/13/2017 08:57    Assessment & Plan:   Rourke was seen today for cough.  Diagnoses and all orders for this visit:  Cough- His chest  x-ray is negative for mass or infiltrate. -     DG Chest 2 View; Future  RTI (respiratory tract infection)- I will treat the infection with Omnicef. -     cefdinir (OMNICEF) 300 MG capsule; Take 1 capsule (300 mg total) by mouth 2 (two) times daily for 10 days.   I am having Trevor Soloway. Griffin start on cefdinir. I am also having him maintain his multivitamin with minerals, aspirin, atorvastatin, buPROPion, nitroGLYCERIN, esomeprazole, B-complex with vitamin C, oxyCODONE, metoprolol tartrate, Glycopyrrolate, feeding supplement (ENSURE ENLIVE), levothyroxine, and ferrous sulfate.  Meds ordered this encounter  Medications  . cefdinir (OMNICEF) 300 MG capsule    Sig: Take 1 capsule (300 mg total) by mouth 2 (two) times daily for 10 days.    Dispense:  20 capsule    Refill:  0  Follow-up: Return in about 4 weeks (around 06/10/2017).  Scarlette Calico, MD

## 2017-05-13 NOTE — Patient Instructions (Signed)
Cough, Adult  Coughing is a reflex that clears your throat and your airways. Coughing helps to heal and protect your lungs. It is normal to cough occasionally, but a cough that happens with other symptoms or lasts a long time may be a sign of a condition that needs treatment. A cough may last only 2-3 weeks (acute), or it may last longer than 8 weeks (chronic).  What are the causes?  Coughing is commonly caused by:   Breathing in substances that irritate your lungs.   A viral or bacterial respiratory infection.   Allergies.   Asthma.   Postnasal drip.   Smoking.   Acid backing up from the stomach into the esophagus (gastroesophageal reflux).   Certain medicines.   Chronic lung problems, including COPD (or rarely, lung cancer).   Other medical conditions such as heart failure.    Follow these instructions at home:  Pay attention to any changes in your symptoms. Take these actions to help with your discomfort:   Take medicines only as told by your health care provider.  ? If you were prescribed an antibiotic medicine, take it as told by your health care provider. Do not stop taking the antibiotic even if you start to feel better.  ? Talk with your health care provider before you take a cough suppressant medicine.   Drink enough fluid to keep your urine clear or pale yellow.   If the air is dry, use a cold steam vaporizer or humidifier in your bedroom or your home to help loosen secretions.   Avoid anything that causes you to cough at work or at home.   If your cough is worse at night, try sleeping in a semi-upright position.   Avoid cigarette smoke. If you smoke, quit smoking. If you need help quitting, ask your health care provider.   Avoid caffeine.   Avoid alcohol.   Rest as needed.    Contact a health care provider if:   You have new symptoms.   You cough up pus.   Your cough does not get better after 2-3 weeks, or your cough gets worse.   You cannot control your cough with suppressant  medicines and you are losing sleep.   You develop pain that is getting worse or pain that is not controlled with pain medicines.   You have a fever.   You have unexplained weight loss.   You have night sweats.  Get help right away if:   You cough up blood.   You have difficulty breathing.   Your heartbeat is very fast.  This information is not intended to replace advice given to you by your health care provider. Make sure you discuss any questions you have with your health care provider.  Document Released: 10/18/2010 Document Revised: 09/27/2015 Document Reviewed: 06/28/2014  Elsevier Interactive Patient Education  2018 Elsevier Inc[employer]

## 2017-05-15 ENCOUNTER — Telehealth: Payer: Self-pay | Admitting: Cardiology

## 2017-05-15 NOTE — Telephone Encounter (Signed)
New message  Pt verbalized that she is calling for RN  Pt canceled PT appt he said that he hurt his back

## 2017-05-15 NOTE — Telephone Encounter (Signed)
Returned the call to the physical therapist. She stated that the patient cancelled his appointment due to a hurt back. She wanted the office to be aware.

## 2017-05-20 ENCOUNTER — Telehealth: Payer: Self-pay | Admitting: Internal Medicine

## 2017-05-20 ENCOUNTER — Other Ambulatory Visit: Payer: Self-pay | Admitting: Internal Medicine

## 2017-05-20 DIAGNOSIS — J988 Other specified respiratory disorders: Secondary | ICD-10-CM

## 2017-05-20 MED ORDER — AMOXICILLIN-POT CLAVULANATE 875-125 MG PO TABS: 1.0000 | ORAL_TABLET | Freq: Two times a day (BID) | ORAL | 0 refills | Status: AC

## 2017-05-20 NOTE — Telephone Encounter (Signed)
Copied from [place] [id number]. Topic: Quick Communication - See Telephone Encounter >> May 20, 2017  9:12 AM Trevor Griffin wrote: CRM for notification. See Telephone encounter for:   05/20/17.  Morey Hummingbird RN from [facility] called and stated the patient was in the office last week. He was prescribed an antibiotic last week, he cant afford it so he is still very sick. She wants to know if something cheaper could be called in for him   [id number] - [place] [place] [address].  His call back is [phone] (wife)

## 2017-05-20 NOTE — Telephone Encounter (Signed)
Routing to dr jones, please advise, thanks 

## 2017-05-20 NOTE — Telephone Encounter (Signed)
New rx sent

## 2017-05-20 NOTE — Telephone Encounter (Signed)
Patients wife advised that abx has been sent in

## 2017-05-20 NOTE — Telephone Encounter (Signed)
[facility] RN called to report that pt cannot afford the Abx that was called in last week. Called pt and spoke to [employer]. Pt is coughing thick yellow phlegm, O2 sats 97%, has rhonchi and is hoarse and tired. States he is not having SOB or resp. Distress. Morey Hummingbird stated that pt started taking (an abx he found: Doxycycline) Monday. HHRN advised against it. Sending high priority. Pt's wife is requesting to call her when another ABX is called in.

## 2017-05-21 ENCOUNTER — Telehealth: Payer: Self-pay | Admitting: Cardiology

## 2017-05-21 NOTE — Telephone Encounter (Signed)
Spoke with Daleen Snook with PT and patient has canceled last several weeks. PT almost finished but if he cancels next week she will cancel his remaining visits. This is for information only. Will forward to Dr Ellyn Hack for review

## 2017-05-21 NOTE — Telephone Encounter (Signed)
OK - I can't make him do it.  [place]

## 2017-05-21 NOTE — Telephone Encounter (Signed)
Daleen Snook  Physical Therapist at [facility]) is calling because Mr. Trevor Griffin missed his appt last week and he cancel this weeks visit because he is sick . Please call if you have any questions .   Thanks

## 2017-05-27 ENCOUNTER — Telehealth: Payer: Self-pay | Admitting: Cardiology

## 2017-05-27 NOTE — Telephone Encounter (Signed)
Noted-routed to MD and primary to make aware.

## 2017-05-27 NOTE — Telephone Encounter (Signed)
New Message   Daleen Snook from [facility] to advise that patient no longer wants physical therapy. Per Daleen Snook no need to call unless you all have additional questions.

## 2017-06-04 ENCOUNTER — Institutional Professional Consult (permissible substitution): Payer: Medicare Other | Admitting: Internal Medicine

## 2017-06-05 ENCOUNTER — Telehealth: Payer: Self-pay | Admitting: Internal Medicine

## 2017-06-05 NOTE — Telephone Encounter (Signed)
Copied from [place] [id number]. Topic: Quick Communication - See Telephone Encounter >> Jun 05, 2017  2:39 PM Margot Ables wrote: CRM for notification. See Telephone encounter for: extend [facility] SN orders 1x every other week for 8 weeks  Caller name: Marianna Fuss RN with [facility] Can be reached: [phone]   06/05/17.

## 2017-06-08 NOTE — Telephone Encounter (Signed)
Contacted Kerri RN with [facility] and gave verbal orders as requested.

## 2017-06-11 ENCOUNTER — Ambulatory Visit: Payer: Medicare Other | Admitting: Internal Medicine

## 2017-06-11 DIAGNOSIS — Z0289 Encounter for other administrative examinations: Secondary | ICD-10-CM

## 2017-06-13 ENCOUNTER — Other Ambulatory Visit: Payer: Self-pay | Admitting: Internal Medicine

## 2017-06-13 DIAGNOSIS — G893 Neoplasm related pain (acute) (chronic): Secondary | ICD-10-CM

## 2017-06-13 DIAGNOSIS — M159 Polyosteoarthritis, unspecified: Secondary | ICD-10-CM

## 2017-06-13 DIAGNOSIS — M60862 Other myositis, left lower leg: Secondary | ICD-10-CM

## 2017-06-13 DIAGNOSIS — M150 Primary generalized (osteo)arthritis: Principal | ICD-10-CM

## 2017-06-13 MED ORDER — OXYCODONE HCL 5 MG PO TABS: 5.0000 mg | ORAL_TABLET | Freq: Four times a day (QID) | ORAL | 0 refills | Status: DC | PRN

## 2017-06-22 DIAGNOSIS — D509 Iron deficiency anemia, unspecified: Secondary | ICD-10-CM | POA: Diagnosis not present

## 2017-06-22 DIAGNOSIS — G8929 Other chronic pain: Secondary | ICD-10-CM | POA: Diagnosis not present

## 2017-06-22 DIAGNOSIS — F419 Anxiety disorder, unspecified: Secondary | ICD-10-CM | POA: Diagnosis not present

## 2017-06-22 DIAGNOSIS — F1010 Alcohol abuse, uncomplicated: Secondary | ICD-10-CM | POA: Diagnosis not present

## 2017-06-22 DIAGNOSIS — J449 Chronic obstructive pulmonary disease, unspecified: Secondary | ICD-10-CM | POA: Diagnosis not present

## 2017-06-22 DIAGNOSIS — I110 Hypertensive heart disease with heart failure: Secondary | ICD-10-CM | POA: Diagnosis not present

## 2017-06-22 DIAGNOSIS — I2510 Atherosclerotic heart disease of native coronary artery without angina pectoris: Secondary | ICD-10-CM | POA: Diagnosis not present

## 2017-06-22 DIAGNOSIS — I255 Ischemic cardiomyopathy: Secondary | ICD-10-CM | POA: Diagnosis not present

## 2017-06-22 DIAGNOSIS — Z7982 Long term (current) use of aspirin: Secondary | ICD-10-CM | POA: Diagnosis not present

## 2017-06-22 DIAGNOSIS — I5042 Chronic combined systolic (congestive) and diastolic (congestive) heart failure: Secondary | ICD-10-CM | POA: Diagnosis not present

## 2017-06-22 DIAGNOSIS — F329 Major depressive disorder, single episode, unspecified: Secondary | ICD-10-CM

## 2017-06-22 DIAGNOSIS — R1310 Dysphagia, unspecified: Secondary | ICD-10-CM | POA: Diagnosis not present

## 2017-07-07 ENCOUNTER — Ambulatory Visit (INDEPENDENT_AMBULATORY_CARE_PROVIDER_SITE_OTHER): Payer: Medicare Other | Admitting: Internal Medicine

## 2017-07-07 ENCOUNTER — Encounter: Payer: Self-pay | Admitting: Internal Medicine

## 2017-07-07 ENCOUNTER — Ambulatory Visit (INDEPENDENT_AMBULATORY_CARE_PROVIDER_SITE_OTHER)
Admission: RE | Admit: 2017-07-07 | Discharge: 2017-07-07 | Disposition: A | Discharge status: Home / Self Care | Payer: Medicare Other | Source: Ambulatory Visit | Attending: Internal Medicine | Admitting: Internal Medicine

## 2017-07-07 VITALS — BP 128/76 | HR 64 | Ht 68.0 in | Wt 180.0 lb

## 2017-07-07 DIAGNOSIS — R911 Solitary pulmonary nodule: Secondary | ICD-10-CM

## 2017-07-07 DIAGNOSIS — J449 Chronic obstructive pulmonary disease, unspecified: Secondary | ICD-10-CM | POA: Diagnosis not present

## 2017-07-07 DIAGNOSIS — F17210 Nicotine dependence, cigarettes, uncomplicated: Secondary | ICD-10-CM

## 2017-07-07 NOTE — Progress Notes (Signed)
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 Concern for RML, RUL spread and lymphnode enlargement. 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  Patient has been having trouble with L leg pain and swelling for the past 3 months. Was actually admitted for this on 11/12 - 11/18 to our service. Work up at that time showed only superficial thrombophlebitis, no DVT on [place]. He was covered for cellulitis. Started on Xarelto but due to anemia didn't end up really starting this and just wound up on 325 ASA daily (divided in 2 doses). Cards was hesitant to prescribe this given his severe anemia (HGB 7 already this month).  He presents to the ED with continued leg pain and now has progressive SOB, severe DOE. Cant walk to doorway without getting SOB.   ED Course:HGB 6.4, a repeat US of LLE is again neg for DVT, CT of LLE also neg for DVT. CTA chest is neg for PE; however, does show a worsening "inflamatory process" with ground glass opacities throughout the R lung, this compared to CT just earlier this month.  Persistent lymph node enlargement and pulm nodules.    Hospital course:  Lung consolidation Aspiration pneumonia Zosyn prescribed empirically and transitioned to Augmentin on discharge. Speech therapy evaluated and patient has mild aspiration risk. Modification techniques discussed with patient by speech  therapy to minimize aspiration. Flutter valve every one hour.symptoms improved prior to discharge.  Dysphagia, oropharyngeal phase Patient taught chin-tuck technique. Dysphagia 3 diet.  Acute respiratory failure with hypoxia Secondary to above. Improving.  Acute on chronic symptomatic anemia S/p 3 units PRBC likely secondary to ecchymosis of left leg.Hemoglobin up to 8.2 from 6.6and stable at 9.0. No evidence of bleeding.  Left lower extremity thrombophlebitis Improved.  LE edema Chronic for the past 3 months. Non-pitting. Multiple CTs unremarkable except for circumferential edema. Prior ultrasounds significant for history of superior thrombophebitis which has resolved per ultrasound this admission. MRI obtained and again was significant for non-specific edema and also inflammatory vs infectious myositis. Since patient has been treated for infectious causes, will discharge with prednisone trial.  COPD Stable. No exacerbation  Chronic diastolic heart failure Stable.  Tongue cancer Patient is s/p resection and graft. 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 New Consult    history of lung cancer 2015-lobectomy, new nodules on x-ray  Dyspnea:  MMRC3 = can't walk 100 yards even at a slow pace at a flat grade s stopping due to sob  but does [employer] ok Cough: no Sleep: on side  SABA use:  none   No obvious day to day or daytime variability or assoc excess/ purulent sputum or mucus plugs or hemoptysis or cp or chest tightness, subjective wheeze or overt sinus or hb symptoms. No unusual exposure hx or h/o  childhood pna/ asthma or knowledge of premature birth.  Sleeping ok flat without nocturnal  or early am exacerbation  of respiratory  c/o's or need for noct saba. Also denies any obvious fluctuation of symptoms with weather or environmental changes or other aggravating or alleviating factors except as outlined above   Current Allergies, Complete Past Medical History, Past Surgical History, Family History, and Social History were reviewed in [employer] record.  ROS  The following are not active complaints unless bolded Hoarseness, sore throat, dysphagia, dental problems, itching, sneezing,  nasal congestion or discharge of excess mucus or purulent secretions, ear ache,   fever, chills, sweats, unintended wt loss or wt gain, classically pleuritic or exertional cp,  orthopnea pnd or leg swelling, presyncope, palpitations, abdominal pain, anorexia, nausea, vomiting, diarrhea  or change in bowel habits or change in bladder habits, change in stools or change in urine, dysuria, hematuria,  rash, arthralgias, visual complaints, headache, numbness, weakness or ataxia or problems with walking or coordination,  change in mood/affect or memory.        Current Meds  Medication Sig  . aspirin 81 MG tablet Take 1 tablet (81 mg total) by mouth daily. (Patient taking differently: Take 81 mg by mouth 2 (two) times daily. )  . atorvastatin (LIPITOR) 80 MG tablet Take 1 tablet (80 mg total) by mouth daily.  . B Complex-C (B-COMPLEX WITH VITAMIN C) tablet Take 1 tablet daily by mouth.  Marland Kitchen buPROPion (WELLBUTRIN SR) 150 MG 12 hr tablet Take 1 tablet (150 mg total) by mouth 2 (two) times daily.  Marland Kitchen esomeprazole (NEXIUM) 20 MG capsule Take 20 mg by mouth daily.   . feeding supplement, ENSURE ENLIVE, (ENSURE ENLIVE) LIQD Take 237 mLs by mouth 4 (four) times daily.  . ferrous sulfate 325 (65 FE) MG tablet Take 1 tablet (325 mg total) by mouth 3 (three) times daily with meals.  . Glycopyrrolate (LONHALA  MAGNAIR REFILL KIT) 25 MCG/ML SOLN Inhale 1 Act into the lungs 2 (two) times daily.  Marland Kitchen levothyroxine (SYNTHROID, LEVOTHROID) 112 MCG tablet Take 1 tablet (112 mcg total) by mouth daily.  . metoprolol tartrate (LOPRESSOR) 25 MG tablet Take 0.5 tablets (12.5 mg total) by mouth 2 (two) times daily.  . Multiple Vitamins-Minerals (MULTIVITAMIN WITH MINERALS) tablet Take 1 tablet by mouth daily.    Marland Kitchen oxyCODONE (OXY IR/ROXICODONE) 5 MG immediate release tablet Take 1 tablet (5 mg total) by mouth every 6 (six) hours as needed for severe pain.               Review of Systems     Objective:   Physical Exam amb hoarse wm nad  Wt Readings from Last 3 Encounters:  07/07/17 180 lb (81.6 kg)  05/13/17 181 lb 8 oz (82.3 kg)  04/16/17 181 lb 12 oz (82.4 kg)     Vital signs reviewed - Note on arrival 02 sats  97% on RA     HEENT: nl dentition, turbinates bilaterally,  . Nl external ear canals without cough reflex  - tongue deformity    NECK :  without JVD/Nodes/TM/ nl carotid upstrokes bilaterally   LUNGS: no acc muscle use,  Nl contour chest distant bs s wheeze    CV:  RRR  no s3 or murmur or increase in P2, and no edema   ABD:  soft and nontender with nl inspiratory excursion in the supine position. No bruits or organomegaly appreciated, bowel sounds nl  MS:  Nl gait/ ext warm without deformities, calf tenderness, cyanosis or clubbing No obvious joint restrictions   SKIN: warm and dry without lesions    NEURO:  alert, approp, nl sensorium with speech impediment   no motor or cerebellar deficits apparent.    CXR PA and Lateral:   07/07/2017 :    I personally reviewed images and agree with radiology impression as follows:    No active cardiopulmonary disease. Chronic stable postsurgical/post treatment changes of the right lung.    Assessment:

## 2017-07-07 NOTE — Patient Instructions (Signed)
The key is to stop smoking completely before smoking completely stops you!    Please remember to go to the  x-ray department downstairs in the basement  for your tests - we will call you with the results when they are available.      Please schedule a follow up visit in 3 months but call sooner if needed with pfts on return

## 2017-07-08 ENCOUNTER — Encounter: Payer: Self-pay | Admitting: Internal Medicine

## 2017-07-08 NOTE — Assessment & Plan Note (Signed)

## 2017-07-08 NOTE — Assessment & Plan Note (Signed)
Although there are clearly abnormalities on CT scan, they should probably be considered "microscopic" since not obvious on plain cxr .     In the setting of obvious "macroscopic" health issues,  I am very reluctatnt to embark on an invasive w/u at this point but will arrange consevative  follow up and in the meantime see what we can do to address the patient's subjective concerns.    His recent acute illness makes it difficult to sort out etiology of the microscopic changes which may just be ALI related but already has planned f/u per [facility] for serial ct so no need to interrupt that plan for asymptomatic changes not viz on plain film.  Discussed in detail all the  indications, usual  risks and alternatives  relative to the benefits with patient who agrees to proceed with conservative f/u as outlined    Total time devoted to counseling  > 50 % of initial 60 min office visit:  review case with pt/ discussion of options/alternatives/ personally creating written customized instructions  in presence of pt  then going over those specific  Instructions directly with the pt including how to use all of the meds but in particular covering each new medication in detail and the difference between the maintenance= "automatic" meds and the prns using an action plan format for the latter (If this problem/symptom => do that organization reading Left to right).  Please see AVS from this visit for a full list of these instructions which I personally wrote for this pt and  are unique to this visit.

## 2017-07-08 NOTE — Assessment & Plan Note (Signed)
Moderate to severe clinically already on LAMA and needs to return for full pfts and consider adding lama - if freq exac consider laba/ics addition depending on his insurance formulary restrictions/ device limitations but the key is obviously stop smoking now (see separate a/p)

## 2017-08-03 ENCOUNTER — Other Ambulatory Visit: Payer: Self-pay | Admitting: Internal Medicine

## 2017-08-03 DIAGNOSIS — M159 Polyosteoarthritis, unspecified: Secondary | ICD-10-CM

## 2017-08-03 DIAGNOSIS — G893 Neoplasm related pain (acute) (chronic): Secondary | ICD-10-CM

## 2017-08-03 DIAGNOSIS — M150 Primary generalized (osteo)arthritis: Principal | ICD-10-CM

## 2017-08-03 DIAGNOSIS — M60862 Other myositis, left lower leg: Secondary | ICD-10-CM

## 2017-08-03 NOTE — Telephone Encounter (Signed)
Copied from [place] [id number]. Topic: Quick Communication - Rx Refill/Question >> Aug 03, 2017  2:38 PM Boyd Kerbs wrote:  Medication:  oxyCODONE (OXY IR/ROXICODONE) 5 MG immediate release tablet  Has appt on 4/16  asking if can call in some to last until appt.    Has the patient contacted their pharmacy? No.  (Agent: If no, request that the patient contact the pharmacy for the refill.)  [id number] [address] [place] [address] Phone: [phone] Fax: [phone]   Agent: Please be advised that RX refills may take up to 3 business days. We ask that you follow-up with your pharmacy.

## 2017-08-04 MED ORDER — OXYCODONE HCL 5 MG PO TABS: 5.0000 mg | ORAL_TABLET | Freq: Four times a day (QID) | ORAL | 0 refills | Status: DC | PRN

## 2017-08-04 NOTE — Telephone Encounter (Signed)
Req. Refill on Oxycodone 5 mg IR tab. Last office visit 05/13/17; acute PCP: Dr. Ronnald Ramp [facility] [address]   Pt. has an appt. For f/u on 08/18/17.

## 2017-08-04 NOTE — Telephone Encounter (Signed)
Check [place] registry last filled 06/13/2017...Johny Chess

## 2017-08-18 ENCOUNTER — Ambulatory Visit (INDEPENDENT_AMBULATORY_CARE_PROVIDER_SITE_OTHER): Payer: Medicare Other | Admitting: Internal Medicine

## 2017-08-18 ENCOUNTER — Encounter: Payer: Self-pay | Admitting: Internal Medicine

## 2017-08-18 ENCOUNTER — Other Ambulatory Visit (INDEPENDENT_AMBULATORY_CARE_PROVIDER_SITE_OTHER): Payer: Medicare Other

## 2017-08-18 VITALS — BP 130/70 | HR 80 | Temp 98.2°F | Resp 16 | Ht 68.0 in | Wt 180.5 lb

## 2017-08-18 DIAGNOSIS — E039 Hypothyroidism, unspecified: Secondary | ICD-10-CM

## 2017-08-18 DIAGNOSIS — D500 Iron deficiency anemia secondary to blood loss (chronic): Secondary | ICD-10-CM

## 2017-08-18 DIAGNOSIS — I10 Essential (primary) hypertension: Secondary | ICD-10-CM

## 2017-08-18 LAB — IBC PANEL
IRON: 55 ug/dL (ref 42–165)
Saturation Ratios: 11.6 % — ABNORMAL LOW (ref 20.0–50.0)
TRANSFERRIN: 338 mg/dL (ref 212.0–360.0)

## 2017-08-18 LAB — CBC WITH DIFFERENTIAL/PLATELET
BASOS PCT: 1.5 % (ref 0.0–3.0)
Basophils Absolute: 0.1 10*3/uL (ref 0.0–0.1)
EOS ABS: 0 10*3/uL (ref 0.0–0.7)
Eosinophils Relative: 0.9 % (ref 0.0–5.0)
HCT: 41.4 % (ref 39.0–52.0)
HEMOGLOBIN: 14.1 g/dL (ref 13.0–17.0)
Lymphocytes Relative: 22.5 % (ref 12.0–46.0)
Lymphs Abs: 0.8 10*3/uL (ref 0.7–4.0)
MCHC: 34 g/dL (ref 30.0–36.0)
MCV: 92 fl (ref 78.0–100.0)
MONO ABS: 0.4 10*3/uL (ref 0.1–1.0)
Monocytes Relative: 12 % (ref 3.0–12.0)
Neutro Abs: 2.3 10*3/uL (ref 1.4–7.7)
Neutrophils Relative %: 63.1 % (ref 43.0–77.0)
PLATELETS: 170 10*3/uL (ref 150.0–400.0)
RBC: 4.5 Mil/uL (ref 4.22–5.81)
RDW: 17.7 % — AB (ref 11.5–15.5)
WBC: 3.7 10*3/uL — AB (ref 4.0–10.5)

## 2017-08-18 LAB — TSH: TSH: 26.48 u[IU]/mL — AB (ref 0.35–4.50)

## 2017-08-18 LAB — FERRITIN: FERRITIN: 22.8 ng/mL (ref 22.0–322.0)

## 2017-08-18 MED ORDER — LEVOTHYROXINE SODIUM 175 MCG PO TABS: 175.0000 ug | ORAL_TABLET | Freq: Every day | ORAL | 1 refills | Status: DC

## 2017-08-18 NOTE — Patient Instructions (Signed)
Hypothyroidism Hypothyroidism is a disorder of the thyroid. The thyroid is a large gland that is located in the lower front of the neck. The thyroid releases hormones that control how the body works. With hypothyroidism, the thyroid does not make enough of these hormones. What are the causes? Causes of hypothyroidism may include:  Viral infections.  Pregnancy.  Your own defense system (immune system) attacking your thyroid.  Certain medicines.  Birth defects.  Past radiation treatments to your head or neck.  Past treatment with radioactive iodine.  Past surgical removal of part or all of your thyroid.  Problems with the gland that is located in the center of your brain (pituitary).  What are the signs or symptoms? Signs and symptoms of hypothyroidism may include:  Feeling as though you have no energy (lethargy).  Inability to tolerate cold.  Weight gain that is not explained by a change in diet or exercise habits.  Dry skin.  Coarse hair.  Menstrual irregularity.  Slowing of thought processes.  Constipation.  Sadness or depression.  How is this diagnosed? Your health care provider may diagnose hypothyroidism with blood tests and ultrasound tests. How is this treated? Hypothyroidism is treated with medicine that replaces the hormones that your body does not make. After you begin treatment, it may take several weeks for symptoms to go away. Follow these instructions at home:  Take medicines only as directed by your health care provider.  If you start taking any new medicines, tell your health care provider.  Keep all follow-up visits as directed by your health care provider. This is important. As your condition improves, your dosage needs may change. You will need to have blood tests regularly so that your health care provider can watch your condition. Contact a health care provider if:  Your symptoms do not get better with treatment.  You are taking thyroid  replacement medicine and: ? You sweat excessively. ? You have tremors. ? You feel anxious. ? You lose weight rapidly. ? You cannot tolerate heat. ? You have emotional swings. ? You have diarrhea. ? You feel weak. Get help right away if:  You develop chest pain.  You develop an irregular heartbeat.  You develop a rapid heartbeat. This information is not intended to replace advice given to you by your health care provider. Make sure you discuss any questions you have with your health care provider. Document Released: 04/21/2005 Document Revised: 09/27/2015 Document Reviewed: 09/06/2013 Elsevier Interactive Patient Education  2018 [employer] Inc[employer]  

## 2017-08-18 NOTE — Progress Notes (Signed)
Subjective:  Patient ID: Trevor Griffin, male    DOB: 01-Aug-1955  Age: 62 y.o. MRN: [id number]  CC: Anemia and Hypothyroidism   HPI Trevor Griffin [facility] presents for f/up - He complains of fatigue.  He tells me the pain and swelling in his left lower extremity has gotten much better.  He is compliant with the iron replacement therapy.  He denies any recent episodes of blood loss or paresthesias.  Outpatient Medications Prior to Visit  Medication Sig Dispense Refill  . aspirin 81 MG tablet Take 1 tablet (81 mg total) by mouth daily. (Patient taking differently: Take 81 mg by mouth 2 (two) times daily. ) 30 tablet 1  . atorvastatin (LIPITOR) 80 MG tablet Take 1 tablet (80 mg total) by mouth daily. 30 tablet 6  . B Complex-C (B-COMPLEX WITH VITAMIN C) tablet Take 1 tablet daily by mouth. 30 tablet 0  . buPROPion (WELLBUTRIN SR) 150 MG 12 hr tablet Take 1 tablet (150 mg total) by mouth 2 (two) times daily. 60 tablet 1  . esomeprazole (NEXIUM) 20 MG capsule Take 20 mg by mouth daily.     . feeding supplement, ENSURE ENLIVE, (ENSURE ENLIVE) LIQD Take 237 mLs by mouth 4 (four) times daily. 120 Bottle 11  . ferrous sulfate 325 (65 FE) MG tablet Take 1 tablet (325 mg total) by mouth 3 (three) times daily with meals. 90 tablet 4  . Glycopyrrolate (LONHALA MAGNAIR REFILL KIT) 25 MCG/ML SOLN Inhale 1 Act into the lungs 2 (two) times daily. 60 mL 11  . metoprolol tartrate (LOPRESSOR) 25 MG tablet Take 0.5 tablets (12.5 mg total) by mouth 2 (two) times daily. 180 tablet 1  . oxyCODONE (OXY IR/ROXICODONE) 5 MG immediate release tablet Take 1 tablet (5 mg total) by mouth every 6 (six) hours as needed for severe pain. 100 tablet 0  . levothyroxine (SYNTHROID, LEVOTHROID) 112 MCG tablet Take 1 tablet (112 mcg total) by mouth daily. 90 tablet 0  . Multiple Vitamins-Minerals (MULTIVITAMIN WITH MINERALS) tablet Take 1 tablet by mouth daily.      . nitroGLYCERIN (NITROSTAT) 0.4 MG SL tablet Place 1 tablet (0.4 mg  total) under the tongue every 5 (five) minutes as needed (up to 3 doses). For chest pain (Patient not taking: Reported on 08/18/2017) 25 tablet 3   No facility-administered medications prior to visit.     ROS Review of Systems  Constitutional: Positive for fatigue. Negative for appetite change, diaphoresis and unexpected weight change.  HENT: Negative for trouble swallowing and voice change.   Eyes: Negative for visual disturbance.  Respiratory: Negative for cough, chest tightness, shortness of breath and wheezing.   Cardiovascular: Negative for chest pain, palpitations and leg swelling.  Gastrointestinal: Negative for abdominal pain, constipation, diarrhea, nausea and vomiting.  Endocrine: Positive for cold intolerance. Negative for heat intolerance.  Genitourinary: Negative.  Negative for difficulty urinating and dysuria.  Musculoskeletal: Negative.  Negative for arthralgias, back pain, myalgias and neck pain.  Skin: Negative.  Negative for color change, pallor and wound.  Neurological: Negative.  Negative for dizziness, weakness and light-headedness.  Hematological: Negative for adenopathy. Does not bruise/bleed easily.  Psychiatric/Behavioral: Negative.     Objective:  BP 130/70 (BP Location: Left Arm, Patient Position: Sitting, Cuff Size: Normal)   Pulse 80   Temp 98.2 F (36.8 C) (Oral)   Resp 16   Ht 5' 8"  (1.727 m)   Wt 180 lb 8 oz (81.9 kg)   SpO2 98%   BMI  27.44 kg/m   BP Readings from Last 3 Encounters:  08/18/17 130/70  07/07/17 128/76  05/13/17 124/80    Wt Readings from Last 3 Encounters:  08/18/17 180 lb 8 oz (81.9 kg)  07/07/17 180 lb (81.6 kg)  05/13/17 181 lb 8 oz (82.3 kg)    Physical Exam  Constitutional: He is oriented to person, place, and time. No distress.  HENT:  Mouth/Throat: Oropharynx is clear and moist. No oropharyngeal exudate.  Eyes: Conjunctivae are normal. No scleral icterus.  Neck: Neck supple. No JVD present. Tracheal deviation  present. No thyromegaly present.  Cardiovascular: Normal rate, regular rhythm and normal heart sounds. Exam reveals no gallop and no friction rub.  No murmur heard. Pulmonary/Chest: Effort normal and breath sounds normal. No stridor. No respiratory distress. He has no wheezes. He has no rales.  Abdominal: Soft. Bowel sounds are normal. He exhibits no mass. There is no tenderness. There is no guarding.  Musculoskeletal: Normal range of motion. He exhibits no edema, tenderness or deformity.  Left lower extremity is larger than the right lower extremity.  There is mild diffuse induration of the left lower extremity but there are no wounds, ulcers, or edema.  Lymphadenopathy:    He has no cervical adenopathy.  Neurological: He is alert and oriented to person, place, and time.  Skin: Skin is warm and dry. No rash noted. He is not diaphoretic.  Vitals reviewed.   Lab Results  Component Value Date   WBC 3.7 (L) 08/18/2017   HGB 14.1 08/18/2017   HCT 41.4 08/18/2017   PLT 170.0 08/18/2017   GLUCOSE 77 04/16/2017   CHOL 127 03/04/2017   TRIG 56 03/04/2017   HDL 35 (L) 03/04/2017   LDLDIRECT 148.4 10/18/2009   LDLCALC 81 03/04/2017   ALT 11 04/16/2017   AST 16 04/16/2017   NA 133 (L) 04/16/2017   K 4.3 04/16/2017   CL 97 04/16/2017   CREATININE 0.75 04/16/2017   BUN 6 04/16/2017   CO2 29 04/16/2017   TSH 26.48 (H) 08/18/2017   INR 1.24 03/18/2017   HGBA1C 4.2 (L) 04/16/2017    Dg Chest 2 View  Result Date: 07/07/2017 CLINICAL DATA:  Right lung nodule. History of right lung cancer post lumpectomy. Emphysema. EXAM: CHEST  2 VIEW COMPARISON:  05/13/2017 FINDINGS: Lungs are adequately inflated demonstrate chronic stable changes in the right base. Mild prominence along the right paramediastinal region unchanged. Postsurgical changes over the right hilar region. Left lung is clear. Mild stable cardiomegaly. Remainder the exam is unchanged. IMPRESSION: No active cardiopulmonary disease.  Chronic stable postsurgical/post treatment changes of the right lung. Electronically Signed   By: Marin Olp M.D.   On: 07/07/2017 15:39    Assessment & Plan:   Trevor Griffin was seen today for anemia and hypothyroidism.  Diagnoses and all orders for this visit:  Acquired hypothyroidism- His TSH is elevated at 27.  I recommend that he increase his dose of levothyroxine. -     TSH; Future -     levothyroxine (SYNTHROID, LEVOTHROID) 175 MCG tablet; Take 1 tablet (175 mcg total) by mouth daily before breakfast.  Essential hypertension- His blood pressure is well controlled.  Iron deficiency anemia due to chronic blood loss- His H&H are normal now and his iron level has improved significantly.  Will continue the current iron supplementation. -     CBC with Differential/Platelet; Future -     IBC panel; Future -     Ferritin; Future   I  have discontinued Hunt Oris. Dexter's multivitamin with minerals and levothyroxine. I am also having him start on levothyroxine. Additionally, I am having him maintain his aspirin, atorvastatin, buPROPion, nitroGLYCERIN, esomeprazole, B-complex with vitamin C, metoprolol tartrate, Glycopyrrolate, feeding supplement (ENSURE ENLIVE), ferrous sulfate, and oxyCODONE.  Meds ordered this encounter  Medications  . levothyroxine (SYNTHROID, LEVOTHROID) 175 MCG tablet    Sig: Take 1 tablet (175 mcg total) by mouth daily before breakfast.    Dispense:  90 tablet    Refill:  1     Follow-up: Return in about 6 months (around 02/17/2018).  Scarlette Calico, MD

## 2017-10-05 ENCOUNTER — Ambulatory Visit: Payer: Medicare Other | Admitting: Internal Medicine

## 2017-10-06 ENCOUNTER — Other Ambulatory Visit: Payer: Self-pay | Admitting: Internal Medicine

## 2017-10-06 DIAGNOSIS — G893 Neoplasm related pain (acute) (chronic): Secondary | ICD-10-CM

## 2017-10-06 DIAGNOSIS — M159 Polyosteoarthritis, unspecified: Secondary | ICD-10-CM

## 2017-10-06 DIAGNOSIS — M60862 Other myositis, left lower leg: Secondary | ICD-10-CM

## 2017-10-06 DIAGNOSIS — M150 Primary generalized (osteo)arthritis: Principal | ICD-10-CM

## 2017-10-06 NOTE — Telephone Encounter (Signed)
Copied from [place] [phone]. Topic: Quick Communication - See Telephone Encounter >> Oct 06, 2017 12:19 PM Conception Chancy, NT wrote: CRM for notification. See Telephone encounter for: 10/06/17.  Patient is requesting a refill on oxyCODONE (OXY IR/ROXICODONE) 5 MG immediate release tablet. Please advise.   [id number] [address], [place] [address] Phone: [phone] Fax: [phone]

## 2017-10-07 NOTE — Telephone Encounter (Signed)
Refill request for Oxycodone / LOV 08-18-17 with Dr. Ronnald Ramp / Last filled: Outpatient Medication Detail    Disp Refills Start End   oxyCODONE (OXY IR/ROXICODONE) 5 MG immediate release tablet 100 tablet 0 08/04/2017    Sig - Route: Take 1 tablet (5 mg total) by mouth every 6 (six) hours as needed for severe pain. - Oral    /

## 2017-10-08 NOTE — Telephone Encounter (Signed)
Pt wife Trevor Griffin calling stating that her husband is out of his medicine and wanted to know if it was filled yet please give her a call at [phone]

## 2017-10-08 NOTE — Telephone Encounter (Signed)
MD is out of the office this week pls advise.Marland KitchenJohny Chess

## 2017-10-09 ENCOUNTER — Other Ambulatory Visit: Payer: Self-pay | Admitting: Hematology and Oncology

## 2017-10-09 DIAGNOSIS — C023 Malignant neoplasm of anterior two-thirds of tongue, part unspecified: Secondary | ICD-10-CM

## 2017-10-09 DIAGNOSIS — E039 Hypothyroidism, unspecified: Secondary | ICD-10-CM

## 2017-10-09 MED ORDER — OXYCODONE HCL 5 MG PO TABS: 5.0000 mg | ORAL_TABLET | Freq: Four times a day (QID) | ORAL | 0 refills | Status: DC | PRN

## 2017-10-09 NOTE — Telephone Encounter (Signed)
Pt wife [name] is calling to check status of RX. Pt has 2 pills left. They were not made aware Dr. Ronnald Ramp was out of office. Will another provider fill. Please call back either way to notify her of the status. Ph # [phone]  [id number] [place].

## 2017-10-09 NOTE — Telephone Encounter (Signed)
[place] controlled substance database checked.  Ok to fill medication.  

## 2017-10-12 ENCOUNTER — Telehealth: Payer: Self-pay | Admitting: *Deleted

## 2017-10-12 ENCOUNTER — Other Ambulatory Visit: Payer: Medicare Other

## 2017-10-12 ENCOUNTER — Ambulatory Visit (HOSPITAL_COMMUNITY): Payer: Medicare Other

## 2017-10-12 NOTE — Telephone Encounter (Signed)
[facility] called to cancel appts for lab, CT and Dr Candis Schatz. States they are having to move out of their house in 1 week. Are trying to find housing. Wants a call back in 2 weeks to reschedule.  Message to scheduler

## 2017-10-13 ENCOUNTER — Other Ambulatory Visit: Payer: Medicare Other

## 2017-10-13 ENCOUNTER — Ambulatory Visit: Payer: Medicare Other | Admitting: Hematology and Oncology

## 2017-11-18 ENCOUNTER — Telehealth: Payer: Self-pay | Admitting: Hematology and Oncology

## 2017-11-18 NOTE — Telephone Encounter (Signed)
Pt called in to reschedule appts. Added appts and transferred them to CT scheduling.

## 2017-11-24 ENCOUNTER — Inpatient Hospital Stay: Payer: Medicare Other | Attending: Hematology and Oncology

## 2017-11-24 ENCOUNTER — Ambulatory Visit (HOSPITAL_COMMUNITY)
Admission: RE | Admit: 2017-11-24 | Discharge: 2017-11-24 | Disposition: A | Discharge status: Home / Self Care | Payer: Medicare Other | Source: Ambulatory Visit | Attending: Hematology and Oncology | Admitting: Hematology and Oncology

## 2017-11-24 ENCOUNTER — Encounter (HOSPITAL_COMMUNITY): Payer: Self-pay

## 2017-11-24 DIAGNOSIS — I700 Atherosclerosis of aorta: Secondary | ICD-10-CM | POA: Diagnosis not present

## 2017-11-24 DIAGNOSIS — K8020 Calculus of gallbladder without cholecystitis without obstruction: Secondary | ICD-10-CM | POA: Insufficient documentation

## 2017-11-24 DIAGNOSIS — Z7982 Long term (current) use of aspirin: Secondary | ICD-10-CM | POA: Insufficient documentation

## 2017-11-24 DIAGNOSIS — R918 Other nonspecific abnormal finding of lung field: Secondary | ICD-10-CM | POA: Insufficient documentation

## 2017-11-24 DIAGNOSIS — R05 Cough: Secondary | ICD-10-CM | POA: Diagnosis not present

## 2017-11-24 DIAGNOSIS — F17210 Nicotine dependence, cigarettes, uncomplicated: Secondary | ICD-10-CM | POA: Insufficient documentation

## 2017-11-24 DIAGNOSIS — M542 Cervicalgia: Secondary | ICD-10-CM | POA: Diagnosis not present

## 2017-11-24 DIAGNOSIS — Z923 Personal history of irradiation: Secondary | ICD-10-CM | POA: Insufficient documentation

## 2017-11-24 DIAGNOSIS — Z85118 Personal history of other malignant neoplasm of bronchus and lung: Secondary | ICD-10-CM | POA: Insufficient documentation

## 2017-11-24 DIAGNOSIS — R634 Abnormal weight loss: Secondary | ICD-10-CM | POA: Diagnosis not present

## 2017-11-24 DIAGNOSIS — B370 Candidal stomatitis: Secondary | ICD-10-CM | POA: Insufficient documentation

## 2017-11-24 DIAGNOSIS — C023 Malignant neoplasm of anterior two-thirds of tongue, part unspecified: Secondary | ICD-10-CM | POA: Insufficient documentation

## 2017-11-24 DIAGNOSIS — I2510 Atherosclerotic heart disease of native coronary artery without angina pectoris: Secondary | ICD-10-CM | POA: Diagnosis not present

## 2017-11-24 DIAGNOSIS — K760 Fatty (change of) liver, not elsewhere classified: Secondary | ICD-10-CM | POA: Diagnosis not present

## 2017-11-24 DIAGNOSIS — Z79899 Other long term (current) drug therapy: Secondary | ICD-10-CM | POA: Diagnosis not present

## 2017-11-24 DIAGNOSIS — E039 Hypothyroidism, unspecified: Secondary | ICD-10-CM

## 2017-11-24 DIAGNOSIS — C3490 Malignant neoplasm of unspecified part of unspecified bronchus or lung: Secondary | ICD-10-CM | POA: Diagnosis not present

## 2017-11-24 LAB — CBC WITH DIFFERENTIAL/PLATELET
Basophils Absolute: 0 10*3/uL (ref 0.0–0.1)
Basophils Relative: 1 %
EOS ABS: 0 10*3/uL (ref 0.0–0.5)
Eosinophils Relative: 1 %
HEMATOCRIT: 43.7 % (ref 38.4–49.9)
HEMOGLOBIN: 14.4 g/dL (ref 13.0–17.1)
LYMPHS ABS: 0.5 10*3/uL — AB (ref 0.9–3.3)
LYMPHS PCT: 14 %
MCH: 32 pg (ref 27.2–33.4)
MCHC: 33 g/dL (ref 32.0–36.0)
MCV: 96.8 fL (ref 79.3–98.0)
MONOS PCT: 15 %
Monocytes Absolute: 0.6 10*3/uL (ref 0.1–0.9)
Neutro Abs: 2.7 10*3/uL (ref 1.5–6.5)
Neutrophils Relative %: 69 %
Platelets: 148 10*3/uL (ref 140–400)
RBC: 4.51 MIL/uL (ref 4.20–5.82)
RDW: 16.7 % — ABNORMAL HIGH (ref 11.0–14.6)
WBC: 3.9 10*3/uL — AB (ref 4.0–10.3)

## 2017-11-24 LAB — COMPREHENSIVE METABOLIC PANEL
ALK PHOS: 116 U/L (ref 38–126)
ALT: 24 U/L (ref 0–44)
AST: 28 U/L (ref 15–41)
Albumin: 3.7 g/dL (ref 3.5–5.0)
Anion gap: 7 (ref 5–15)
BILIRUBIN TOTAL: 0.5 mg/dL (ref 0.3–1.2)
BUN: 6 mg/dL — ABNORMAL LOW (ref 8–23)
CALCIUM: 9.3 mg/dL (ref 8.9–10.3)
CO2: 29 mmol/L (ref 22–32)
Chloride: 100 mmol/L (ref 98–111)
Creatinine, Ser: 0.79 mg/dL (ref 0.61–1.24)
Glucose, Bld: 99 mg/dL (ref 70–99)
Potassium: 4.1 mmol/L (ref 3.5–5.1)
SODIUM: 136 mmol/L (ref 135–145)
TOTAL PROTEIN: 7.4 g/dL (ref 6.5–8.1)

## 2017-11-24 MED ORDER — IOPAMIDOL (ISOVUE-300) INJECTION 61%
100.0000 mL | Freq: Once | INTRAVENOUS | Status: AC | PRN
  Administered 2017-11-24: 100 mL via INTRAVENOUS

## 2017-11-24 MED ORDER — IOPAMIDOL (ISOVUE-300) INJECTION 61%
INTRAVENOUS | Status: AC
  Filled 2017-11-24: qty 100

## 2017-11-26 ENCOUNTER — Inpatient Hospital Stay (HOSPITAL_BASED_OUTPATIENT_CLINIC_OR_DEPARTMENT_OTHER): Payer: Medicare Other | Admitting: Hematology and Oncology

## 2017-11-26 ENCOUNTER — Other Ambulatory Visit: Payer: Self-pay | Admitting: Hematology and Oncology

## 2017-11-26 ENCOUNTER — Other Ambulatory Visit: Payer: Medicare Other

## 2017-11-26 DIAGNOSIS — Z923 Personal history of irradiation: Secondary | ICD-10-CM

## 2017-11-26 DIAGNOSIS — R634 Abnormal weight loss: Secondary | ICD-10-CM | POA: Diagnosis not present

## 2017-11-26 DIAGNOSIS — F17210 Nicotine dependence, cigarettes, uncomplicated: Secondary | ICD-10-CM

## 2017-11-26 DIAGNOSIS — B370 Candidal stomatitis: Secondary | ICD-10-CM | POA: Diagnosis not present

## 2017-11-26 DIAGNOSIS — R05 Cough: Secondary | ICD-10-CM | POA: Diagnosis not present

## 2017-11-26 DIAGNOSIS — C3490 Malignant neoplasm of unspecified part of unspecified bronchus or lung: Secondary | ICD-10-CM

## 2017-11-26 DIAGNOSIS — Z7982 Long term (current) use of aspirin: Secondary | ICD-10-CM | POA: Diagnosis not present

## 2017-11-26 DIAGNOSIS — Z79899 Other long term (current) drug therapy: Secondary | ICD-10-CM | POA: Diagnosis not present

## 2017-11-26 DIAGNOSIS — C023 Malignant neoplasm of anterior two-thirds of tongue, part unspecified: Secondary | ICD-10-CM

## 2017-11-26 DIAGNOSIS — Z85118 Personal history of other malignant neoplasm of bronchus and lung: Secondary | ICD-10-CM | POA: Diagnosis not present

## 2017-11-26 DIAGNOSIS — J449 Chronic obstructive pulmonary disease, unspecified: Secondary | ICD-10-CM

## 2017-11-26 MED ORDER — FLUCONAZOLE 100 MG PO TABS: 100.0000 mg | ORAL_TABLET | Freq: Every day | ORAL | 0 refills | Status: DC

## 2017-11-27 ENCOUNTER — Telehealth: Payer: Self-pay | Admitting: Hematology and Oncology

## 2017-11-27 ENCOUNTER — Encounter: Payer: Self-pay | Admitting: Hematology and Oncology

## 2017-11-27 NOTE — Assessment & Plan Note (Signed)
The patient continues to smoke despite 2 different cancer diagnosis We  discussed extensively the importance of nicotine cessation.

## 2017-11-27 NOTE — Assessment & Plan Note (Signed)
Unfortunately, CT scan of the chest show new lung lesion on the left upper lobe I recommend PET CT scan for staging It could be due to a new primary lung cancer versus metastatic recurrent cancer from the tongue The patient is advised to quit smoking I plan to see him back after PET CT scan is available If PET CT scan shows no other evidence of cancer elsewhere and it appears to be resectable, I will refer him back to cardiothoracic surgery

## 2017-11-27 NOTE — Progress Notes (Signed)
[facility] OFFICE PROGRESS NOTE  Patient Care Team: Janith Lima, MD as PCP - General (Internal Medicine)  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Trevor Griffin) I noticed abnormality at the base of the tongue but it could be due to yeast infection I recommend PET CT scan for evaluation In addition, I have instructed his wife to call the patient's ENT surgeon for further evaluation as well  History of lung cancer Unfortunately, CT scan of the chest show new lung lesion on the left upper lobe I recommend PET CT scan for staging It could be due to a new primary lung cancer versus metastatic recurrent cancer from the tongue The patient is advised to quit smoking I plan to see him back after PET CT scan is available If PET CT scan shows no other evidence of cancer elsewhere and it appears to be resectable, I will refer him back to cardiothoracic surgery  Thrush, oral He is noted to have oral thrush I will prescribe 1 week course of fluconazole  Cigarette smoker The patient continues to smoke despite 2 different cancer diagnosis We  discussed extensively the importance of nicotine cessation.   No orders of the defined types were placed in this encounter.   INTERVAL HISTORY: Please see below for problem oriented charting. He returns with his wife for further follow-up He continues to smoke half a pack to 1 pack of cigarettes per day He missed several appointments recently but finally reschedule and had CT scan of the chest performed yesterday He continues to have mild nonproductive cough He denies recent fever or chills Appetite is stable but he has lost some weight He denies recent infection.  SUMMARY OF ONCOLOGIC HISTORY:   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[facility])   09/16/2012 Pathologic Stage    [id number]      09/16/2012 Pathology Results    Right tongue--Invasive SCC, mod differentiated, tumor 4.4 cm, negative margins for invasive dz,  dysplasia seen at [facility] mucosal margin, Grade 2, PNI+, LVI neg, 1 oral LN neg. Right neck LN dissection (zone 2 & 3)---1/21 LNs positive for metastatic [facility]      09/16/2012 Surgery    Tracheotomy, bilateral selective neck dissection, subtotal glossectomy with left radial forearm fasicocutaneous free flap reconstruction with split-thickness skin graft from left thigh ([place])       09/21/2012 Surgery    G-tube placement [facility])      09/23/2012 Procedure    Trach removal [facility])      10/15/2012 Imaging    Staging CT c/a/p: No acute process or evidence of metastatic disease in the chest, abd, or pelvis. Similar tiny RUL lung nodule since 2006, consistent with a benign etiology.       10/25/2012 - 12/10/2012 Radiation Therapy    IMRT Isidore Moos). Right Tongue/ Bilateral Neck /// Total Dose, 60 Gy in 30 Fractions      03/03/2013 Imaging    CT chest: Enlarging (and now cavitary) nodule in RLL measuring 1.3 cm (previous exam measured 4 mm). Suspicious for SCC metastasis.       02/2013 Procedure    PEG removed       05/09/2013 Imaging    CT Chest showed further increase in size of cavitary pulmonary nodule in the posterior right lower lobe now measuring 19 mm, with adjacent ill-defined nodular opacity possibly due to associated postobstructive atelectasis or pneumonitis. 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 Roxan Hockey)      06/06/2013 Pathology Results    Accession: [id number] pathology report from wedge resection of the lung confirmed squamous cell carcinoma of the lung      08/09/2013 Imaging    CT Chest showed interval development of multiple ground-glass attenuating nodules within the left lower lobe which are likely the sequelae of inflammation, infection or aspiration. Short-term followup imaging in 3  months is recommended to ensure resolution. 2. Status post right lower lobectomy. 3. Small right pleural effusion      11/15/2013 Imaging    Postoperative changes of right lower lobectomy. Interval resolution of the left lower lobe ground-glass opacity nodules. Small right pleural effusion persists.      07/13/2014 Imaging    CT chest: s/p RUL lobectomy. No findings to suggest recurrent disease or new mets to thorax. Tiny 4 mm LUL nodule unchanged from 10/2012 and considered benign.        12/14/2014 Imaging    CT chest: Stable postoperative chest CT. No evidence of local reurrence or metastatic disease. Stable postsurgical scarring in right lung.       06/29/2015 Imaging    CXR: Patient evaluated for acute concerns regarding fatigue, cough, and dysphagia.         03/07/2016 Imaging    Ct chest showed new ill-defined nodular ground-glass throughout the right hemi thorax with some associated septal thickening, worrisome for lymphangitic carcinomatosis. An infectious etiology is not excluded. Aortic atherosclerosis (ICD10-170.0). Coronary artery calcification. Cholelithiasis.      05/08/2016 Imaging    Ct chest showed interval resolution of the infectious or inflammatory process in the right lung. Minimal residual tiny airspace nodules but no infiltrates or effusions.      10/20/2016 Imaging    Stable post treatment changes in the neck without evidence of recurrent disease.      10/20/2016 Imaging    1. Stable examination with scattered tiny pulmonary nodules bilaterally. 2. No acute findings or evidence of metastatic disease. 3. Age advanced coronary artery atherosclerosis. Aortic Atherosclerosis (ICD10-I70.0). 4. Cholelithiasis.      11/24/2017 Imaging    1. Stable and satisfactory post treatment appearance of the neck. 2. Chest CT today reported separately      11/24/2017 Imaging    1. Thick-walled cavitary nodule in the lingula, grossly stable from 03/16/2017 but new from  10/20/2016 and highly worrisome for malignancy, either metastatic head and neck cancer or primary bronchogenic carcinoma. 2. Aortic atherosclerosis (ICD10-170.0). Coronary artery calcification. 3. Borderline enlarged pulmonary arteries. 4. Hepatic steatosis. 5. Cholelithiasis.       History of lung cancer   06/06/2013 Pathology Results    1. Lung, wedge biopsy/resection, Right lower lobe - SQUAMOUS CELL CARCINOMA, 3 CM. 2. Lung, wedge biopsy/resection, Right upper lobe - BENIGN LUNG TISSUE. - NO TUMOR IDENTIFIED. 3. Lung, resection (segmental or lobe), Right lower lobe - FOCAL HEMORRHAGE CONSISTENT WITH PREVIOUS WEDGE. - NO RESIDUAL TUMOR IDENTIFIED. - FINAL MARGINS CLEAR. 4. Lymph node, biopsy, 11R#1 - ANTHRACOTIC LYMPH NODE. - NO TUMOR IDENTIFIED 5. Lymph node, biopsy, 11R#2 - ANTHRACOTIC LYMPH NODE. - NO TUMOR IDENTIFIED. 6. Lymph node, biopsy, 10 R #1 - ANTHRACOTIC LYMPH NODE. - NO TUMOR IDENTIFIED. Marland Kitchen 7. Lymph node, biopsy, 10 R #2 - ANTHRACOTIC LYMPH NODE. - NO TUMOR IDENTIFIED. 8. Lymph node, biopsy, level 9 - ANTHRACOTIC LYMPH NODE. - NO TUMOR IDENTIFIED.  06/06/2013 Surgery    Right video-assisted thoracoscopy, wedge resection, thoracoscopic right lower lobectomy, lymph node dissection       REVIEW OF SYSTEMS:   Eyes: Denies blurriness of vision Ears, nose, mouth, throat, and face: Denies mucositis or sore throat Cardiovascular: Denies palpitation, chest discomfort or lower extremity swelling Gastrointestinal:  Denies nausea, heartburn or change in bowel habits Skin: Denies abnormal skin rashes Lymphatics: Denies new lymphadenopathy or easy bruising Neurological:Denies numbness, tingling or new weaknesses Behavioral/Psych: Mood is stable, no new changes  All other systems were reviewed with the patient and are negative.  I have reviewed the past medical history, past surgical history, social history and family history with the patient and they are  unchanged from previous note.  ALLERGIES:  is allergic to hydromorphone and nubain [nalbuphine hcl].  MEDICATIONS:  Current Outpatient Medications  Medication Sig Dispense Refill  . aspirin 81 MG tablet Take 1 tablet (81 mg total) by mouth daily. (Patient taking differently: Take 81 mg by mouth 2 (two) times daily. ) 30 tablet 1  . atorvastatin (LIPITOR) 80 MG tablet Take 1 tablet (80 mg total) by mouth daily. 30 tablet 6  . B Complex-C (B-COMPLEX WITH VITAMIN C) tablet Take 1 tablet daily by mouth. 30 tablet 0  . buPROPion (WELLBUTRIN SR) 150 MG 12 hr tablet Take 1 tablet (150 mg total) by mouth 2 (two) times daily. 60 tablet 1  . esomeprazole (NEXIUM) 20 MG capsule Take 20 mg by mouth daily.     . feeding supplement, ENSURE ENLIVE, (ENSURE ENLIVE) LIQD Take 237 mLs by mouth 4 (four) times daily. 120 Bottle 11  . fluconazole (DIFLUCAN) 100 MG tablet Take 1 tablet (100 mg total) by mouth daily. 7 tablet 0  . Glycopyrrolate (LONHALA MAGNAIR REFILL KIT) 25 MCG/ML SOLN Inhale 1 Act into the lungs 2 (two) times daily. 60 mL 11  . levothyroxine (SYNTHROID, LEVOTHROID) 175 MCG tablet Take 1 tablet (175 mcg total) by mouth daily before breakfast. 90 tablet 1  . metoprolol tartrate (LOPRESSOR) 25 MG tablet Take 0.5 tablets (12.5 mg total) by mouth 2 (two) times daily. 180 tablet 1  . nitroGLYCERIN (NITROSTAT) 0.4 MG SL tablet Place 1 tablet (0.4 mg total) under the tongue every 5 (five) minutes as needed (up to 3 doses). For chest pain (Patient not taking: Reported on 08/18/2017) 25 tablet 3  . oxyCODONE (OXY IR/ROXICODONE) 5 MG immediate release tablet Take 1 tablet (5 mg total) by mouth every 6 (six) hours as needed for severe pain. 100 tablet 0   No current facility-administered medications for this visit.     PHYSICAL EXAMINATION: ECOG PERFORMANCE STATUS: 1 - Symptomatic but completely ambulatory  Vitals:   11/26/17 1255  BP: (!) 146/79  Pulse: 86  Resp: 18  SpO2: 100%   Filed Weights    11/26/17 1255  Weight: 166 lb 14.4 oz (75.7 kg)    GENERAL:alert, no distress and comfortable SKIN: skin color, texture, turgor are normal, no rashes or significant lesions EYES: normal, Conjunctiva are pink and non-injected, sclera clear OROPHARYNX: Noted tongue resection.  Mild abnormalities noted at the base of tongue resection, consistent with oral thrush NECK: supple, thyroid normal size, non-tender, without nodularity LYMPH:  no palpable lymphadenopathy in the cervical, axillary or inguinal LUNGS: clear to auscultation and percussion with normal breathing effort HEART: regular rate & rhythm and no murmurs and no lower extremity edema ABDOMEN:abdomen soft, non-tender and normal bowel sounds Musculoskeletal:no cyanosis of digits and no clubbing  NEURO:  alert & oriented x 3 with fluent speech, no focal motor/sensory deficits  LABORATORY DATA:  I have reviewed the data as listed    Component Value Date/Time   NA 136 11/24/2017 1139   NA 137 03/04/2017 1509   NA 135 (L) 01/12/2017 0845   K 4.1 11/24/2017 1139   K 4.7 01/12/2017 0845   CL 100 11/24/2017 1139   CO2 29 11/24/2017 1139   CO2 24 01/12/2017 0845   GLUCOSE 99 11/24/2017 1139   GLUCOSE 91 01/12/2017 0845   BUN 6 (L) 11/24/2017 1139   BUN 10 03/04/2017 1509   BUN 8.0 01/12/2017 0845   CREATININE 0.79 11/24/2017 1139   CREATININE 0.9 01/12/2017 0845   CALCIUM 9.3 11/24/2017 1139   CALCIUM 9.7 01/12/2017 0845   PROT 7.4 11/24/2017 1139   PROT 7.1 03/04/2017 1509   PROT 7.7 01/12/2017 0845   ALBUMIN 3.7 11/24/2017 1139   ALBUMIN 4.2 03/04/2017 1509   ALBUMIN 3.6 01/12/2017 0845   AST 28 11/24/2017 1139   AST 21 01/12/2017 0845   ALT 24 11/24/2017 1139   ALT 16 01/12/2017 0845   ALKPHOS 116 11/24/2017 1139   ALKPHOS 94 01/12/2017 0845   BILITOT 0.5 11/24/2017 1139   BILITOT 0.9 03/04/2017 1509   BILITOT 0.41 01/12/2017 0845   GFRNONAA >60 11/24/2017 1139   GFRAA >60 11/24/2017 1139    No results found for:  SPEP, UPEP  Lab Results  Component Value Date   WBC 3.9 (L) 11/24/2017   NEUTROABS 2.7 11/24/2017   HGB 14.4 11/24/2017   HCT 43.7 11/24/2017   MCV 96.8 11/24/2017   PLT 148 11/24/2017      Chemistry      Component Value Date/Time   NA 136 11/24/2017 1139   NA 137 03/04/2017 1509   NA 135 (L) 01/12/2017 0845   K 4.1 11/24/2017 1139   K 4.7 01/12/2017 0845   CL 100 11/24/2017 1139   CO2 29 11/24/2017 1139   CO2 24 01/12/2017 0845   BUN 6 (L) 11/24/2017 1139   BUN 10 03/04/2017 1509   BUN 8.0 01/12/2017 0845   CREATININE 0.79 11/24/2017 1139   CREATININE 0.9 01/12/2017 0845      Component Value Date/Time   CALCIUM 9.3 11/24/2017 1139   CALCIUM 9.7 01/12/2017 0845   ALKPHOS 116 11/24/2017 1139   ALKPHOS 94 01/12/2017 0845   AST 28 11/24/2017 1139   AST 21 01/12/2017 0845   ALT 24 11/24/2017 1139   ALT 16 01/12/2017 0845   BILITOT 0.5 11/24/2017 1139   BILITOT 0.9 03/04/2017 1509   BILITOT 0.41 01/12/2017 0845       RADIOGRAPHIC STUDIES: I have reviewed multiple imaging study with the patient and his wife I have personally reviewed the radiological images as listed and agreed with the findings in the report. Ct Soft Tissue Neck W Contrast  Result Date: 11/24/2017 CLINICAL DATA:  62 year old male with history of right oral tongue squamous cell carcinoma treated in 2014 and lung cancer treated in 2015. Left neck and chest pain. Subsequent encounter. EXAM: CT NECK WITH CONTRAST TECHNIQUE: Multidetector CT imaging of the neck was performed using the standard protocol following the bolus administration of intravenous contrast. CONTRAST:  127m ISOVUE-300 IOPAMIDOL (ISOVUE-300) INJECTION 61% in conjunction with contrast enhanced imaging of the chest reported separately. COMPARISON:  Neck CT 10/20/2016 FINDINGS: Pharynx and larynx: The glottis is closed today. The larynx otherwise appears stable. Post treatment thickening of the epiglottis again noted. Pharyngeal  soft tissue  contours are within normal limits. Chronic postoperative changes to the oral tongue with surgical clips appear stable. Stable parapharyngeal and retropharyngeal spaces. Salivary glands: Stable postoperative changes to the sublingual and right submandibular spaces. The right submandibular gland is absent and the left is chronically atrophied. The parotid glands are within normal limits. Thyroid: Negative. Lymph nodes: Negative.  No cervical lymphadenopathy. Vascular: The major vascular structures in the neck and at the skull base remain patent. Chronic arterial tortuosity in the neck and thoracic inlet. Chronic atherosclerosis. Calcified atherosclerosis at the skull base. Limited intracranial: Negative. Visualized orbits: Negative. Mastoids and visualized paranasal sinuses: Chronic left maxillary sinus mucous retention cyst, otherwise clear paranasal sinuses. Chronic bilateral mastoid and left tympanic cavity opacification. Skeleton: Chronically absent dentition. No acute or suspicious osseous lesion. Upper chest: Reported separately today. IMPRESSION: 1. Stable and satisfactory post treatment appearance of the neck. 2. Chest CT today reported separately. Electronically Signed   By: Genevie Ann M.D.   On: 11/24/2017 14:50   Ct Chest W Contrast  Result Date: 11/25/2017 CLINICAL DATA:  Tongue cancer, lung cancer, left neck and chest pain. EXAM: CT CHEST WITH CONTRAST TECHNIQUE: Multidetector CT imaging of the chest was performed during intravenous contrast administration. CONTRAST:  11m ISOVUE-300 IOPAMIDOL (ISOVUE-300) INJECTION 61% COMPARISON:  03/30/2017, 03/16/2017 and 10/20/2016. FINDINGS: Cardiovascular: Atherosclerotic calcification of the arterial vasculature, including coronary arteries. Pulmonary arteries are borderline enlarged. Heart is at the upper limits of normal in size to mildly enlarged. No pericardial effusion. Mediastinum/Nodes: No pathologically enlarged mediastinal, hilar or axillary lymph  nodes. Esophagus is grossly unremarkable. Lungs/Pleura: A 1.3 x 1.3 cm thick-walled cavitary nodule is seen in the lingula and is grossly stable from 03/16/2017 but new 10/20/2016. 3 mm left upper lobe nodule (image 55), unchanged. Right lower lobectomy. No pleural fluid. Airway is otherwise unremarkable. Upper Abdomen: Visualized portion of the liver is decreased in attenuation diffusely. Stones are seen in the gallbladder. Visualized portions of the adrenal glands, kidneys, spleen pancreas, stomach and bowel are grossly unremarkable. No upper abdominal adenopathy. Musculoskeletal: No worrisome lytic or sclerotic lesions. Degenerative changes in the spine. IMPRESSION: 1. Thick-walled cavitary nodule in the lingula, grossly stable from 03/16/2017 but new from 10/20/2016 and highly worrisome for malignancy, either metastatic head and neck cancer or primary bronchogenic carcinoma. 2. Aortic atherosclerosis (ICD10-170.0). Coronary artery calcification. 3. Borderline enlarged pulmonary arteries. 4. Hepatic steatosis. 5. Cholelithiasis. Electronically Signed   By: MLorin PicketM.D.   On: 11/25/2017 09:31    All questions were answered. The patient knows to call the clinic with any problems, questions or concerns. No barriers to learning was detected.  I spent 30 minutes counseling the patient face to face. The total time spent in the appointment was 40 minutes and more than 50% was on counseling and review of test results  NHeath Lark MD 11/27/2017 8:38 AM

## 2017-11-27 NOTE — Assessment & Plan Note (Signed)
He is noted to have oral thrush I will prescribe 1 week course of fluconazole

## 2017-11-27 NOTE — Telephone Encounter (Signed)
No new orders or referrals per 7/25 los.

## 2017-11-27 NOTE — Assessment & Plan Note (Signed)
I noticed abnormality at the base of the tongue but it could be due to yeast infection I recommend PET CT scan for evaluation In addition, I have instructed his wife to call the patient's ENT surgeon for further evaluation as well

## 2017-11-30 ENCOUNTER — Telehealth: Payer: Self-pay | Admitting: Hematology and Oncology

## 2017-11-30 NOTE — Telephone Encounter (Signed)
Spoke to patients wife regarding upcoming aug appts per 7/29 sch message. Patient unable to make 8/5 appt due to surgery. Provider aware of updates.

## 2017-12-04 ENCOUNTER — Telehealth: Payer: Self-pay | Admitting: Hematology and Oncology

## 2017-12-04 ENCOUNTER — Telehealth: Payer: Self-pay | Admitting: *Deleted

## 2017-12-04 ENCOUNTER — Ambulatory Visit (HOSPITAL_COMMUNITY): Payer: Medicare Other

## 2017-12-04 NOTE — Telephone Encounter (Signed)
Message sent to scheduler. 

## 2017-12-04 NOTE — Telephone Encounter (Signed)
Spoke to patients wife regarding upcoming aug appts per 8/2 sch message

## 2017-12-04 NOTE — Telephone Encounter (Signed)
[name] called to say they had to reschedule Trevor Griffin's PET to 8/12.  Will need to reschedule appt with Dr Alvy Bimler.

## 2017-12-04 NOTE — Telephone Encounter (Signed)
My next available would be on 8/20

## 2017-12-09 ENCOUNTER — Other Ambulatory Visit: Payer: Self-pay | Admitting: Internal Medicine

## 2017-12-09 ENCOUNTER — Ambulatory Visit: Payer: Medicare Other | Admitting: Hematology and Oncology

## 2017-12-09 DIAGNOSIS — M159 Polyosteoarthritis, unspecified: Secondary | ICD-10-CM

## 2017-12-09 DIAGNOSIS — M60862 Other myositis, left lower leg: Secondary | ICD-10-CM

## 2017-12-09 DIAGNOSIS — M150 Primary generalized (osteo)arthritis: Principal | ICD-10-CM

## 2017-12-09 DIAGNOSIS — G893 Neoplasm related pain (acute) (chronic): Secondary | ICD-10-CM

## 2017-12-09 MED ORDER — OXYCODONE HCL 5 MG PO TABS: 5.0000 mg | ORAL_TABLET | Freq: Four times a day (QID) | ORAL | 0 refills | Status: DC | PRN

## 2017-12-09 NOTE — Telephone Encounter (Signed)
LOV 08/18/17 Dr. Ronnald Ramp Last refill 10/09/17  # 100 with 0 refill [facility] [address] [employer]

## 2017-12-09 NOTE — Telephone Encounter (Signed)
Copied from [place] [phone]. Topic: Quick Communication - Rx Refill/Question >> Dec 09, 2017 11:15 AM Keene Breath wrote: Medication: oxyCODONE (OXY IR/ROXICODONE) 5 MG immediate release tablet  Patient called to request refill for the above medication.  CB# [phone]  Preferred Pharmacy (with phone number or street name): [id number] [address], [place]. [phone] (Phone) [phone] (Fax)

## 2017-12-09 NOTE — Telephone Encounter (Signed)
MD approved and sent electronically to pof../lmb  

## 2017-12-09 NOTE — Telephone Encounter (Signed)
Check [place] registry last filled 10/09/2017.Marland KitchenJohny Griffin

## 2017-12-14 ENCOUNTER — Ambulatory Visit: Payer: Medicare Other | Admitting: Internal Medicine

## 2017-12-14 ENCOUNTER — Ambulatory Visit (HOSPITAL_COMMUNITY): Payer: Medicare Other

## 2017-12-17 ENCOUNTER — Encounter (HOSPITAL_COMMUNITY)
Admission: RE | Admit: 2017-12-17 | Discharge: 2017-12-17 | Disposition: A | Discharge status: Home / Self Care | Payer: Medicare Other | Source: Ambulatory Visit | Attending: Hematology and Oncology | Admitting: Hematology and Oncology

## 2017-12-17 DIAGNOSIS — C3490 Malignant neoplasm of unspecified part of unspecified bronchus or lung: Secondary | ICD-10-CM | POA: Diagnosis not present

## 2017-12-17 LAB — GLUCOSE, CAPILLARY: Glucose-Capillary: 88 mg/dL (ref 70–99)

## 2017-12-17 MED ORDER — FLUDEOXYGLUCOSE F - 18 (FDG) INJECTION
8.6400 | Freq: Once | INTRAVENOUS | Status: AC | PRN
  Administered 2017-12-17: 8.64 via INTRAVENOUS

## 2017-12-22 ENCOUNTER — Encounter: Payer: Self-pay | Admitting: Hematology and Oncology

## 2017-12-22 ENCOUNTER — Inpatient Hospital Stay: Payer: Medicare Other | Attending: Hematology and Oncology | Admitting: Hematology and Oncology

## 2017-12-22 ENCOUNTER — Encounter: Payer: Self-pay | Admitting: Internal Medicine

## 2017-12-22 ENCOUNTER — Ambulatory Visit (INDEPENDENT_AMBULATORY_CARE_PROVIDER_SITE_OTHER): Payer: Medicare Other | Admitting: Internal Medicine

## 2017-12-22 ENCOUNTER — Other Ambulatory Visit: Payer: Self-pay | Admitting: Internal Medicine

## 2017-12-22 ENCOUNTER — Other Ambulatory Visit (INDEPENDENT_AMBULATORY_CARE_PROVIDER_SITE_OTHER): Payer: Medicare Other

## 2017-12-22 VITALS — BP 118/70 | HR 88 | Temp 97.9°F | Resp 16 | Ht 68.0 in | Wt 164.8 lb

## 2017-12-22 DIAGNOSIS — Z85118 Personal history of other malignant neoplasm of bronchus and lung: Secondary | ICD-10-CM | POA: Insufficient documentation

## 2017-12-22 DIAGNOSIS — E039 Hypothyroidism, unspecified: Secondary | ICD-10-CM | POA: Diagnosis not present

## 2017-12-22 DIAGNOSIS — I10 Essential (primary) hypertension: Secondary | ICD-10-CM | POA: Diagnosis not present

## 2017-12-22 DIAGNOSIS — Z7982 Long term (current) use of aspirin: Secondary | ICD-10-CM | POA: Insufficient documentation

## 2017-12-22 DIAGNOSIS — R64 Cachexia: Secondary | ICD-10-CM

## 2017-12-22 DIAGNOSIS — F17210 Nicotine dependence, cigarettes, uncomplicated: Secondary | ICD-10-CM | POA: Diagnosis not present

## 2017-12-22 DIAGNOSIS — F1010 Alcohol abuse, uncomplicated: Secondary | ICD-10-CM | POA: Diagnosis not present

## 2017-12-22 DIAGNOSIS — C023 Malignant neoplasm of anterior two-thirds of tongue, part unspecified: Secondary | ICD-10-CM | POA: Diagnosis present

## 2017-12-22 DIAGNOSIS — Z79899 Other long term (current) drug therapy: Secondary | ICD-10-CM | POA: Diagnosis not present

## 2017-12-22 DIAGNOSIS — I252 Old myocardial infarction: Secondary | ICD-10-CM | POA: Insufficient documentation

## 2017-12-22 DIAGNOSIS — Z923 Personal history of irradiation: Secondary | ICD-10-CM | POA: Diagnosis not present

## 2017-12-22 DIAGNOSIS — Z85810 Personal history of malignant neoplasm of tongue: Secondary | ICD-10-CM | POA: Insufficient documentation

## 2017-12-22 DIAGNOSIS — R05 Cough: Secondary | ICD-10-CM

## 2017-12-22 DIAGNOSIS — C3492 Malignant neoplasm of unspecified part of left bronchus or lung: Secondary | ICD-10-CM | POA: Insufficient documentation

## 2017-12-22 LAB — TSH: TSH: 10.98 u[IU]/mL — ABNORMAL HIGH (ref 0.35–4.50)

## 2017-12-22 MED ORDER — DRONABINOL 2.5 MG PO CAPS: 2.5000 mg | ORAL_CAPSULE | Freq: Two times a day (BID) | ORAL | 0 refills | Status: AC

## 2017-12-22 MED ORDER — DRONABINOL 2.5 MG PO CAPS: 2.5000 mg | ORAL_CAPSULE | Freq: Two times a day (BID) | ORAL | 0 refills | Status: DC

## 2017-12-22 NOTE — Progress Notes (Signed)
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[place]) The yeast infection on his mouth has resolved PET CT scan show no evidence of cancer in his tongue He will be observed  History of lung cancer Unfortunately, repeat PET CT scan confirmed the likelihood of new lung cancer I have been in touch with his CT surgeon to see if he is still a candidate for resection He will be scheduled to see him back to discuss this hopefully soon  Cigarette smoker We had extensive discussion over the past few years about the importance of nicotine cessation He is down to 2 cigarettes/day I encouraged her effort and recommend he continues smoking cessation effort before his planned surgery   No orders of the defined types were placed in this encounter.   INTERVAL HISTORY: Please see below for problem oriented charting. 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 Right neck LN dissection (zone 2 & 3)---1/21 LNs positive for metastatic [facility]    09/16/2012 Surgery    Tracheotomy, bilateral selective neck dissection, subtotal glossectomy with left radial forearm fasicocutaneous free flap reconstruction with  split-thickness skin graft from left thigh ([place])     09/21/2012 Surgery    G-tube placement [facility])    09/23/2012 Procedure    Trach removal [facility])    10/15/2012 Imaging    Staging CT c/a/p: No acute process or evidence of metastatic disease in the chest, abd, or pelvis. Similar tiny RUL lung nodule since 2006, consistent with a benign etiology.     10/25/2012 - 12/10/2012 Radiation Therapy    IM[facility] Isidore Moos). Right Tongue/ Bilateral Neck /// Total Dose, 60 Gy in 30 Fractions    03/03/2013 Imaging    CT chest: Enlarging (and now cavitary) nodule in RLL measuring 1.3 cm (previous exam measured 4 mm). Suspicious for SCC metastasis.     02/2013 Procedure    PEG removed     05/09/2013 Imaging    CT Chest showed further increase in size of cavitary pulmonary nodule in the posterior right lower lobe now measuring 19 mm, with adjacent ill-defined nodular opacity possibly due to associated postobstructive atelectasis or pneumonitis. 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 [facility] Hockey)    06/06/2013 Pathology Results    Accession: [id number] pathology report from wedge resection of the lung confirmed squamous cell carcinoma of the lung    08/09/2013 Imaging    CT Chest showed interval development of multiple ground-glass attenuating nodules within the left lower lobe which are likely the sequelae of inflammation, infection or  aspiration. Short-term followup imaging in 3 months is recommended to ensure resolution. 2. Status post right lower lobectomy. 3. Small right pleural effusion    11/15/2013 Imaging    Postoperative changes of right lower lobectomy. Interval resolution of the left lower lobe ground-glass opacity nodules. Small right pleural effusion persists.    07/13/2014 Imaging    CT chest: s/p RUL lobectomy. No  findings to suggest recurrent disease or new mets to thorax. Tiny 4 mm LUL nodule unchanged from 10/2012 and considered benign.      12/14/2014 Imaging    CT chest: Stable postoperative chest CT. No evidence of local reurrence or metastatic disease. Stable postsurgical scarring in right lung.     06/29/2015 Imaging    CXR: Patient evaluated for acute concerns regarding fatigue, cough, and dysphagia.       03/07/2016 Imaging    Ct chest showed new ill-defined nodular ground-glass throughout the right hemi thorax with some associated septal thickening, worrisome for lymphangitic carcinomatosis. An infectious etiology is not excluded. Aortic atherosclerosis (ICD10-170.0). Coronary artery calcification. Cholelithiasis.    05/08/2016 Imaging    Ct chest showed interval resolution of the infectious or inflammatory process in the right lung. Minimal residual tiny airspace nodules but no infiltrates or effusions.    10/20/2016 Imaging    Stable post treatment changes in the neck without evidence of recurrent disease.    10/20/2016 Imaging    1. Stable examination with scattered tiny pulmonary nodules bilaterally. 2. No acute findings or evidence of metastatic disease. 3. Age advanced coronary artery atherosclerosis. Aortic Atherosclerosis (ICD10-I70.0). 4. Cholelithiasis.    11/24/2017 Imaging    1. Stable and satisfactory post treatment appearance of the neck. 2. Chest CT today reported separately    11/24/2017 Imaging    1. Thick-walled cavitary nodule in the lingula, grossly stable from 03/16/2017 but new from 10/20/2016 and highly worrisome for malignancy, either metastatic head and neck cancer or primary bronchogenic carcinoma. 2. Aortic atherosclerosis (ICD10-170.0). Coronary artery calcification. 3. Borderline enlarged pulmonary arteries. 4. Hepatic steatosis. 5. Cholelithiasis.    12/17/2017 PET scan    1. The cavitary 1.6 cm left upper lobe nodule has a maximum SUV of 6.9, compatible with  malignancy. 2. Faint focus of accentuated activity in the left anterolateral sixth rib with maximum SUV 3.1, slightly asymmetric. No underlying appreciable fracture or underlying CT abnormality. This may well simply be incidental but I would suggest attention to the left anterolateral sixth rib on any follow up imaging. 3. Other imaging findings of potential clinical significance: Mucous retention cyst in left maxillary sinus. Left mastoid effusion. Aortic Atherosclerosis (ICD10-I70.0). Coronary atherosclerosis. Prior right lower lobectomy. Prior myocardial infarction with fat density in the left ventricular myocardial. Descending and proximal sigmoid colon diverticulosis. Right scrotal hydrocele. Cholelithiasis. Diffuse hepatic steatosis. Right mid kidney cyst.     History of lung cancer   06/06/2013 Pathology Results    1. Lung, wedge biopsy/resection, Right lower lobe - SQUAMOUS CELL CARCINOMA, 3 CM. 2. Lung, wedge biopsy/resection, Right upper lobe - BENIGN LUNG TISSUE. - NO TUMOR IDENTIFIED. 3. Lung, resection (segmental or lobe), Right lower lobe - FOCAL HEMORRHAGE CONSISTENT WITH PREVIOUS WEDGE. - NO RESIDUAL TUMOR IDENTIFIED. - FINAL MARGINS CLEAR. 4. Lymph node, biopsy, 11R#1 - ANTHRACOTIC LYMPH NODE. - NO TUMOR IDENTIFIED 5. Lymph node, biopsy, 11R#2 - ANTHRACOTIC LYMPH NODE. - NO TUMOR IDENTIFIED. 6. Lymph node, biopsy, 10 R #1 - ANTHRACOTIC LYMPH NODE. - NO TUMOR IDENTIFIED. Marland Kitchen 7. Lymph node, biopsy, 10 R #  2 - ANTHRACOTIC LYMPH NODE. - NO TUMOR IDENTIFIED. 8. Lymph node, biopsy, level 9 - ANTHRACOTIC LYMPH NODE. - NO TUMOR IDENTIFIED.    06/06/2013 Surgery    Right video-assisted thoracoscopy, wedge resection, thoracoscopic right lower lobectomy, lymph node dissection     REVIEW OF SYSTEMS:   Constitutional: Denies fevers, chills or abnormal weight loss Eyes: Denies blurriness of vision Ears, nose, mouth, throat, and face: Denies mucositis or sore  throat Cardiovascular: Denies palpitation, chest discomfort or lower extremity swelling Gastrointestinal:  Denies nausea, heartburn or change in bowel habits Skin: Denies abnormal skin rashes Lymphatics: Denies new lymphadenopathy or easy bruising Neurological:Denies numbness, tingling or new weaknesses Behavioral/Psych: Mood is stable, no new changes  All other systems were reviewed with the patient and are negative.  I have reviewed the past medical history, past surgical history, social history and family history with the patient and they are unchanged from previous note.  ALLERGIES:  is allergic to hydromorphone and nubain [nalbuphine hcl].  MEDICATIONS:  Current Outpatient Medications  Medication Sig Dispense Refill  . aspirin 81 MG tablet Take 1 tablet (81 mg total) by mouth daily. (Patient taking differently: Take 81 mg by mouth 2 (two) times daily. ) 30 tablet 1  . atorvastatin (LIPITOR) 80 MG tablet Take 1 tablet (80 mg total) by mouth daily. 30 tablet 6  . B Complex-C (B-COMPLEX WITH VITAMIN C) tablet Take 1 tablet daily by mouth. 30 tablet 0  . buPROPion (WELLBUTRIN SR) 150 MG 12 hr tablet Take 1 tablet (150 mg total) by mouth 2 (two) times daily. 60 tablet 1  . esomeprazole (NEXIUM) 20 MG capsule Take 20 mg by mouth daily.     . feeding supplement, ENSURE ENLIVE, (ENSURE ENLIVE) LIQD Take 237 mLs by mouth 4 (four) times daily. 120 Bottle 11  . Glycopyrrolate (LONHALA MAGNAIR REFILL KIT) 25 MCG/ML SOLN Inhale 1 Act into the lungs 2 (two) times daily. 60 mL 11  . levothyroxine (SYNTHROID, LEVOTHROID) 175 MCG tablet Take 1 tablet (175 mcg total) by mouth daily before breakfast. 90 tablet 1  . metoprolol tartrate (LOPRESSOR) 25 MG tablet Take 0.5 tablets (12.5 mg total) by mouth 2 (two) times daily. 180 tablet 1  . nitroGLYCERIN (NITROSTAT) 0.4 MG SL tablet Place 1 tablet (0.4 mg total) under the tongue every 5 (five) minutes as needed (up to 3 doses). For chest pain (Patient not  taking: Reported on 08/18/2017) 25 tablet 3  . oxyCODONE (OXY IR/ROXICODONE) 5 MG immediate release tablet Take 1 tablet (5 mg total) by mouth every 6 (six) hours as needed for severe pain. 100 tablet 0   No current facility-administered medications for this visit.     PHYSICAL EXAMINATION: ECOG PERFORMANCE STATUS: 1 - Symptomatic but completely ambulatory  Vitals:   12/22/17 1136  BP: 122/64  Pulse: 84  Resp: 18  Temp: (!) 97.5 F (36.4 C)  SpO2: 100%   Filed Weights   12/22/17 1136  Weight: 165 lb (74.8 kg)    GENERAL:alert, no distress and comfortable SKIN: skin color, texture, turgor are normal, no rashes or significant lesions EYES: normal, Conjunctiva are pink and non-injected, sclera clear OROPHARYNX:no exudate, no erythema and lips, buccal mucosa, and signs of prior tongue resection.  The yeast infection is gone NECK: supple, thyroid normal size, non-tender, without nodularity LYMPH:  no palpable lymphadenopathy in the cervical, axillary or inguinal LUNGS: clear to auscultation and percussion with normal breathing effort HEART: regular rate & rhythm and no murmurs and no  lower extremity edema ABDOMEN:abdomen soft, non-tender and normal bowel sounds Musculoskeletal:no cyanosis of digits and no clubbing  NEURO: alert & oriented x 3 with fluent speech, no focal motor/sensory deficits  LABORATORY DATA:  I have reviewed the data as listed    Component Value Date/Time   NA 136 11/24/2017 1139   NA 137 03/04/2017 1509   NA 135 (L) 01/12/2017 0845   K 4.1 11/24/2017 1139   K 4.7 01/12/2017 0845   CL 100 11/24/2017 1139   CO2 29 11/24/2017 1139   CO2 24 01/12/2017 0845   GLUCOSE 99 11/24/2017 1139   GLUCOSE 91 01/12/2017 0845   BUN 6 (L) 11/24/2017 1139   BUN 10 03/04/2017 1509   BUN 8.0 01/12/2017 0845   CREATININE 0.79 11/24/2017 1139   CREATININE 0.9 01/12/2017 0845   CALCIUM 9.3 11/24/2017 1139   CALCIUM 9.7 01/12/2017 0845   PROT 7.4 11/24/2017 1139   PROT  7.1 03/04/2017 1509   PROT 7.7 01/12/2017 0845   ALBUMIN 3.7 11/24/2017 1139   ALBUMIN 4.2 03/04/2017 1509   ALBUMIN 3.6 01/12/2017 0845   AST 28 11/24/2017 1139   AST 21 01/12/2017 0845   ALT 24 11/24/2017 1139   ALT 16 01/12/2017 0845   ALKPHOS 116 11/24/2017 1139   ALKPHOS 94 01/12/2017 0845   BILITOT 0.5 11/24/2017 1139   BILITOT 0.9 03/04/2017 1509   BILITOT 0.41 01/12/2017 0845   GFRNONAA >60 11/24/2017 1139   GFRAA >60 11/24/2017 1139    No results found for: SPEP, UPEP  Lab Results  Component Value Date   WBC 3.9 (L) 11/24/2017   NEUTROABS 2.7 11/24/2017   HGB 14.4 11/24/2017   HCT 43.7 11/24/2017   MCV 96.8 11/24/2017   PLT 148 11/24/2017      Chemistry      Component Value Date/Time   NA 136 11/24/2017 1139   NA 137 03/04/2017 1509   NA 135 (L) 01/12/2017 0845   K 4.1 11/24/2017 1139   K 4.7 01/12/2017 0845   CL 100 11/24/2017 1139   CO2 29 11/24/2017 1139   CO2 24 01/12/2017 0845   BUN 6 (L) 11/24/2017 1139   BUN 10 03/04/2017 1509   BUN 8.0 01/12/2017 0845   CREATININE 0.79 11/24/2017 1139   CREATININE 0.9 01/12/2017 0845      Component Value Date/Time   CALCIUM 9.3 11/24/2017 1139   CALCIUM 9.7 01/12/2017 0845   ALKPHOS 116 11/24/2017 1139   ALKPHOS 94 01/12/2017 0845   AST 28 11/24/2017 1139   AST 21 01/12/2017 0845   ALT 24 11/24/2017 1139   ALT 16 01/12/2017 0845   BILITOT 0.5 11/24/2017 1139   BILITOT 0.9 03/04/2017 1509   BILITOT 0.41 01/12/2017 0845       RADIOGRAPHIC STUDIES: I have reviewed imaging study with the patient and his wife I have personally reviewed the radiological images as listed and agreed with the findings in the report. Ct Soft Tissue Neck W Contrast  Result Date: 11/24/2017 CLINICAL DATA:  62 year old male with history of right oral tongue squamous cell carcinoma treated in 2014 and lung cancer treated in 2015. Left neck and chest pain. Subsequent encounter. EXAM: CT NECK WITH CONTRAST TECHNIQUE: Multidetector  CT imaging of the neck was performed using the standard protocol following the bolus administration of intravenous contrast. CONTRAST:  132m ISOVUE-300 IOPAMIDOL (ISOVUE-300) INJECTION 61% in conjunction with contrast enhanced imaging of the chest reported separately. COMPARISON:  Neck CT 10/20/2016 FINDINGS: Pharynx and larynx:  The glottis is closed today. The larynx otherwise appears stable. Post treatment thickening of the epiglottis again noted. Pharyngeal soft tissue contours are within normal limits. Chronic postoperative changes to the oral tongue with surgical clips appear stable. Stable parapharyngeal and retropharyngeal spaces. Salivary glands: Stable postoperative changes to the sublingual and right submandibular spaces. The right submandibular gland is absent and the left is chronically atrophied. The parotid glands are within normal limits. Thyroid: Negative. Lymph nodes: Negative.  No cervical lymphadenopathy. Vascular: The major vascular structures in the neck and at the skull base remain patent. Chronic arterial tortuosity in the neck and thoracic inlet. Chronic atherosclerosis. Calcified atherosclerosis at the skull base. Limited intracranial: Negative. Visualized orbits: Negative. Mastoids and visualized paranasal sinuses: Chronic left maxillary sinus mucous retention cyst, otherwise clear paranasal sinuses. Chronic bilateral mastoid and left tympanic cavity opacification. Skeleton: Chronically absent dentition. No acute or suspicious osseous lesion. Upper chest: Reported separately today. IMPRESSION: 1. Stable and satisfactory post treatment appearance of the neck. 2. Chest CT today reported separately. Electronically Signed   By: Genevie Ann M.D.   On: 11/24/2017 14:50   Ct Chest W Contrast  Result Date: 11/25/2017 CLINICAL DATA:  Tongue cancer, lung cancer, left neck and chest pain. EXAM: CT CHEST WITH CONTRAST TECHNIQUE: Multidetector CT imaging of the chest was performed during intravenous  contrast administration. CONTRAST:  115m ISOVUE-300 IOPAMIDOL (ISOVUE-300) INJECTION 61% COMPARISON:  03/30/2017, 03/16/2017 and 10/20/2016. FINDINGS: Cardiovascular: Atherosclerotic calcification of the arterial vasculature, including coronary arteries. Pulmonary arteries are borderline enlarged. Heart is at the upper limits of normal in size to mildly enlarged. No pericardial effusion. Mediastinum/Nodes: No pathologically enlarged mediastinal, hilar or axillary lymph nodes. Esophagus is grossly unremarkable. Lungs/Pleura: A 1.3 x 1.3 cm thick-walled cavitary nodule is seen in the lingula and is grossly stable from 03/16/2017 but new 10/20/2016. 3 mm left upper lobe nodule (image 55), unchanged. Right lower lobectomy. No pleural fluid. Airway is otherwise unremarkable. Upper Abdomen: Visualized portion of the liver is decreased in attenuation diffusely. Stones are seen in the gallbladder. Visualized portions of the adrenal glands, kidneys, spleen pancreas, stomach and bowel are grossly unremarkable. No upper abdominal adenopathy. Musculoskeletal: No worrisome lytic or sclerotic lesions. Degenerative changes in the spine. IMPRESSION: 1. Thick-walled cavitary nodule in the lingula, grossly stable from 03/16/2017 but new from 10/20/2016 and highly worrisome for malignancy, either metastatic head and neck cancer or primary bronchogenic carcinoma. 2. Aortic atherosclerosis (ICD10-170.0). Coronary artery calcification. 3. Borderline enlarged pulmonary arteries. 4. Hepatic steatosis. 5. Cholelithiasis. Electronically Signed   By: MLorin PicketM.D.   On: 11/25/2017 09:31   Nm Pet Image Restag (ps) Skull Base To Thigh  Result Date: 12/17/2017 CLINICAL DATA:  Subsequent treatment strategy for non-small cell lung cancer. EXAM: NUCLEAR MEDICINE PET SKULL BASE TO THIGH TECHNIQUE: 8 point sick mCi F-18 FDG was injected intravenously. Full-ring PET imaging was performed from the skull base to thigh after the radiotracer.  CT data was obtained and used for attenuation correction and anatomic localization. Fasting blood glucose: 88 mg/dl COMPARISON:  PET-CT of 03/10/2013 and chest CT of 11/24/2017 FINDINGS: Mediastinal blood pool activity: SUV max 2.2 NECK: No significant abnormal hypermetabolic activity in this region. Incidental CT findings: Mucous retention cyst in the left maxillary sinus. Left mastoid effusion. Bilateral carotid atherosclerotic calcification. Postoperative findings along the right lateral tongue. CHEST: Cavitary 1.6 by 1.4 cm left upper lobe nodule has a maximum standard uptake value of 6.9. Long segment esophageal activity up to maximum SUV 4.3,  probably physiologic. Incidental CT findings: Right lower lobectomy. Coronary, aortic arch, and branch vessel atherosclerotic vascular disease. Fat density along the septum and apex of the left ventricle and extending into the anterior wall favoring prior myocardial infarction. ABDOMEN/PELVIS: No significant abnormal hypermetabolic activity in this region. Incidental CT findings: Diffuse hepatic steatosis. Cholelithiasis. 1.5 cm hypodense lesion of the right mid kidney is photopenic and accordingly likely a cyst. Aortoiliac atherosclerotic vascular disease. Descending and proximal sigmoid colon diverticulosis. Right scrotal hydrocele. SKELETON: Subtle focus of accentuated activity in the left anterolateral sixth rib, maximum SUV 3.1, no corresponding CT findings, contralateral side maximum SUV 2.1. Incidental CT findings: Degenerative arthropathy of the hips, right greater than left. IMPRESSION: 1. The cavitary 1.6 cm left upper lobe nodule has a maximum SUV of 6.9, compatible with malignancy. 2. Faint focus of accentuated activity in the left anterolateral sixth rib with maximum SUV 3.1, slightly asymmetric. No underlying appreciable fracture or underlying CT abnormality. This may well simply be incidental but I would suggest attention to the left anterolateral sixth rib  on any follow up imaging. 3. Other imaging findings of potential clinical significance: Mucous retention cyst in left maxillary sinus. Left mastoid effusion. Aortic Atherosclerosis (ICD10-I70.0). Coronary atherosclerosis. Prior right lower lobectomy. Prior myocardial infarction with fat density in the left ventricular myocardial. Descending and proximal sigmoid colon diverticulosis. Right scrotal hydrocele. Cholelithiasis. Diffuse hepatic steatosis. Right mid kidney cyst. Electronically Signed   By: Van Clines M.D.   On: 12/17/2017 15:12    All questions were answered. The patient knows to call the clinic with any problems, questions or concerns. No barriers to learning was detected.  I spent 15 minutes counseling the patient face to face. The total time spent in the appointment was 20 minutes and more than 50% was on counseling and review of test results  Heath Lark, MD 12/22/2017 11:50 AM

## 2017-12-22 NOTE — Assessment & Plan Note (Signed)
Unfortunately, repeat PET CT scan confirmed the likelihood of new lung cancer I have been in touch with his CT surgeon to see if he is still a candidate for resection He will be scheduled to see him back to discuss this hopefully soon

## 2017-12-22 NOTE — Patient Instructions (Signed)
Hypothyroidism Hypothyroidism is a disorder of the thyroid. The thyroid is a large gland that is located in the lower front of the neck. The thyroid releases hormones that control how the body works. With hypothyroidism, the thyroid does not make enough of these hormones. What are the causes? Causes of hypothyroidism may include:  Viral infections.  Pregnancy.  Your own defense system (immune system) attacking your thyroid.  Certain medicines.  Birth defects.  Past radiation treatments to your head or neck.  Past treatment with radioactive iodine.  Past surgical removal of part or all of your thyroid.  Problems with the gland that is located in the center of your brain (pituitary).  What are the signs or symptoms? Signs and symptoms of hypothyroidism may include:  Feeling as though you have no energy (lethargy).  Inability to tolerate cold.  Weight gain that is not explained by a change in diet or exercise habits.  Dry skin.  Coarse hair.  Menstrual irregularity.  Slowing of thought processes.  Constipation.  Sadness or depression.  How is this diagnosed? Your health care provider may diagnose hypothyroidism with blood tests and ultrasound tests. How is this treated? Hypothyroidism is treated with medicine that replaces the hormones that your body does not make. After you begin treatment, it may take several weeks for symptoms to go away. Follow these instructions at home:  Take medicines only as directed by your health care provider.  If you start taking any new medicines, tell your health care provider.  Keep all follow-up visits as directed by your health care provider. This is important. As your condition improves, your dosage needs may change. You will need to have blood tests regularly so that your health care provider can watch your condition. Contact a health care provider if:  Your symptoms do not get better with treatment.  You are taking thyroid  replacement medicine and: ? You sweat excessively. ? You have tremors. ? You feel anxious. ? You lose weight rapidly. ? You cannot tolerate heat. ? You have emotional swings. ? You have diarrhea. ? You feel weak. Get help right away if:  You develop chest pain.  You develop an irregular heartbeat.  You develop a rapid heartbeat. This information is not intended to replace advice given to you by your health care provider. Make sure you discuss any questions you have with your health care provider. Document Released: 04/21/2005 Document Revised: 09/27/2015 Document Reviewed: 09/06/2013 Elsevier Interactive Patient Education  2018 [employer] Inc[employer]  

## 2017-12-22 NOTE — Progress Notes (Signed)
Subjective:  Patient ID: Trevor Griffin, male    DOB: [birth year]  Age: 62 y.o. MRN: [id number]  CC: Hypothyroidism   HPI Trevor Griffin presents for f/up -he complains of weight loss, lack of desire to eat, loss of appetite and feeling hopeless.  He recently had a PET scan done that showed a new lesion in his lung that looks cancerous.  His wife complains that he drinks beer all day and will not stop smoking cigarettes.  Outpatient Medications Prior to Visit  Medication Sig Dispense Refill  . aspirin 81 MG tablet Take 1 tablet (81 mg total) by mouth daily. (Patient taking differently: Take 81 mg by mouth 2 (two) times daily. ) 30 tablet 1  . B Complex-C (B-COMPLEX WITH VITAMIN C) tablet Take 1 tablet daily by mouth. 30 tablet 0  . esomeprazole (NEXIUM) 20 MG capsule Take 20 mg by mouth daily.     . feeding supplement, ENSURE ENLIVE, (ENSURE ENLIVE) LIQD Take 237 mLs by mouth 4 (four) times daily. 120 Bottle 11  . Glycopyrrolate (LONHALA MAGNAIR REFILL KIT) 25 MCG/ML SOLN Inhale 1 Act into the lungs 2 (two) times daily. 60 mL 11  . levothyroxine (SYNTHROID, LEVOTHROID) 175 MCG tablet Take 1 tablet (175 mcg total) by mouth daily before breakfast. 90 tablet 1  . metoprolol tartrate (LOPRESSOR) 25 MG tablet Take 0.5 tablets (12.5 mg total) by mouth 2 (two) times daily. 180 tablet 1  . nitroGLYCERIN (NITROSTAT) 0.4 MG SL tablet Place 1 tablet (0.4 mg total) under the tongue every 5 (five) minutes as needed (up to 3 doses). For chest pain 25 tablet 3  . oxyCODONE (OXY IR/ROXICODONE) 5 MG immediate release tablet Take 1 tablet (5 mg total) by mouth every 6 (six) hours as needed for severe pain. 100 tablet 0  . atorvastatin (LIPITOR) 80 MG tablet Take 1 tablet (80 mg total) by mouth daily. 30 tablet 6  . buPROPion (WELLBUTRIN SR) 150 MG 12 hr tablet Take 1 tablet (150 mg total) by mouth 2 (two) times daily. 60 tablet 1   No facility-administered medications prior to visit.     ROS Review of  Systems  Constitutional: Positive for appetite change, fatigue and unexpected weight change.  HENT: Positive for trouble swallowing. Negative for voice change.   Eyes: Negative.   Respiratory: Positive for cough and shortness of breath. Negative for chest tightness and wheezing.   Cardiovascular: Negative for chest pain, palpitations and leg swelling.  Gastrointestinal: Negative for abdominal pain and constipation.  Endocrine: Negative for cold intolerance and heat intolerance.  Genitourinary: Negative.   Musculoskeletal: Negative.  Negative for arthralgias.  Skin: Negative.     Objective:  BP 118/70 (BP Location: Left Arm, Patient Position: Sitting, Cuff Size: Normal)   Pulse 88   Temp 97.9 F (36.6 C) (Axillary)   Resp 16   Ht _0  (1.727 m)   Wt 164 lb 12 oz (74.7 kg)   SpO2 99%   BMI 25.05 kg/m   BP Readings from Last 3 Encounters:  12/22/17 118/70  12/22/17 122/64  11/26/17 (!) 146/79    Wt Readings from Last 3 Encounters:  12/22/17 164 lb 12 oz (74.7 kg)  12/22/17 165 lb (74.8 kg)  11/26/17 166 lb 14.4 oz (75.7 kg)    Physical Exam  Constitutional: He is oriented to person, place, and time. No distress.  HENT:  Mouth/Throat: No oropharyngeal exudate.  Eyes: No scleral icterus.  Neck: Normal range of motion. Neck supple.  No thyromegaly present.  Cardiovascular: Normal rate, regular rhythm and normal heart sounds. Exam reveals no gallop.  No murmur heard. Pulmonary/Chest: Effort normal. No stridor. No respiratory distress. He has no decreased breath sounds. He has wheezes in the right lower field. He has no rhonchi. He has no rales.  Abdominal: Soft. Bowel sounds are normal. He exhibits no mass. There is no tenderness.  Musculoskeletal: Normal range of motion. He exhibits no edema, tenderness or deformity.  Neurological: He is alert and oriented to person, place, and time.  Skin: Skin is warm and dry. No rash noted. He is not diaphoretic.  Psychiatric: He has a  normal mood and affect. His behavior is normal. Judgment and thought content normal.  Vitals reviewed.   Lab Results  Component Value Date   WBC 3.9 (L) 11/24/2017   HGB 14.4 11/24/2017   HCT 43.7 11/24/2017   PLT 148 11/24/2017   GLUCOSE 99 11/24/2017   CHOL 127 03/04/2017   TRIG 56 03/04/2017   HDL 35 (L) 03/04/2017   LDLDIRECT 148.4 10/18/2009   LDLCALC 81 03/04/2017   ALT 24 11/24/2017   AST 28 11/24/2017   NA 136 11/24/2017   K 4.1 11/24/2017   CL 100 11/24/2017   CREATININE 0.79 11/24/2017   BUN 6 (L) 11/24/2017   CO2 29 11/24/2017   TSH 10.98 (H) 12/22/2017   INR 1.24 03/18/2017   HGBA1C 4.2 (L) 04/16/2017    Nm Pet Image Restag (ps) Skull Base To Thigh  Result Date: 12/17/2017 CLINICAL DATA:  Subsequent treatment strategy for non-small cell lung cancer. EXAM: NUCLEAR MEDICINE PET SKULL BASE TO THIGH TECHNIQUE: 8 point sick mCi F-18 FDG was injected intravenously. Full-ring PET imaging was performed from the skull base to thigh after the radiotracer. CT data was obtained and used for attenuation correction and anatomic localization. Fasting blood glucose: 88 mg/dl COMPARISON:  PET-CT of 03/10/2013 and chest CT of 11/24/2017 FINDINGS: Mediastinal blood pool activity: SUV max 2.2 NECK: No significant abnormal hypermetabolic activity in this region. Incidental CT findings: Mucous retention cyst in the left maxillary sinus. Left mastoid effusion. Bilateral carotid atherosclerotic calcification. Postoperative findings along the right lateral tongue. CHEST: Cavitary 1.6 by 1.4 cm left upper lobe nodule has a maximum standard uptake value of 6.9. Long segment esophageal activity up to maximum SUV 4.3, probably physiologic. Incidental CT findings: Right lower lobectomy. Coronary, aortic arch, and branch vessel atherosclerotic vascular disease. Fat density along the septum and apex of the left ventricle and extending into the anterior wall favoring prior myocardial infarction.  ABDOMEN/PELVIS: No significant abnormal hypermetabolic activity in this region. Incidental CT findings: Diffuse hepatic steatosis. Cholelithiasis. 1.5 cm hypodense lesion of the right mid kidney is photopenic and accordingly likely a cyst. Aortoiliac atherosclerotic vascular disease. Descending and proximal sigmoid colon diverticulosis. Right scrotal hydrocele. SKELETON: Subtle focus of accentuated activity in the left anterolateral sixth rib, maximum SUV 3.1, no corresponding CT findings, contralateral side maximum SUV 2.1. Incidental CT findings: Degenerative arthropathy of the hips, right greater than left. IMPRESSION: 1. The cavitary 1.6 cm left upper lobe nodule has a maximum SUV of 6.9, compatible with malignancy. 2. Faint focus of accentuated activity in the left anterolateral sixth rib with maximum SUV 3.1, slightly asymmetric. No underlying appreciable fracture or underlying CT abnormality. This may well simply be incidental but I would suggest attention to the left anterolateral sixth rib on any follow up imaging. 3. Other imaging findings of potential clinical significance: Mucous retention cyst in left  maxillary sinus. Left mastoid effusion. Aortic Atherosclerosis (ICD10-I70.0). Coronary atherosclerosis. Prior right lower lobectomy. Prior myocardial infarction with fat density in the left ventricular myocardial. Descending and proximal sigmoid colon diverticulosis. Right scrotal hydrocele. Cholelithiasis. Diffuse hepatic steatosis. Right mid kidney cyst. Electronically Signed   By: Van Clines M.D.   On: 12/17/2017 15:12    Assessment & Plan:   Darin was seen today for hypothyroidism.  Diagnoses and all orders for this visit:  Acquired hypothyroidism- His TSH is down to 11.  Considering his comorbid illnesses that this is an acceptable level and will therefore continue the current dose of levothyroxine. -     TSH; Future  Essential hypertension- His blood pressure is adequately well  controlled.  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  -     dronabinol (MARINOL) 2.5 MG capsule; Take 1 capsule (2.5 mg total) by mouth 2 (two) times daily before lunch and supper.   I have discontinued Hunt Oris. Stfort's atorvastatin, buPROPion, and dronabinol. I am also having him start on dronabinol. Additionally, I am having him maintain his aspirin, nitroGLYCERIN, esomeprazole, B-complex with vitamin C, metoprolol tartrate, Glycopyrrolate, feeding supplement (ENSURE ENLIVE), levothyroxine, and oxyCODONE.  Meds ordered this encounter  Medications  . dronabinol (MARINOL) 2.5 MG capsule    Sig: Take 1 capsule (2.5 mg total) by mouth 2 (two) times daily before lunch and supper.    Dispense:  60 capsule    Refill:  0     Follow-up: No follow-ups on file.  Scarlette Calico, MD

## 2017-12-22 NOTE — Assessment & Plan Note (Signed)
The yeast infection on his mouth has resolved PET CT scan show no evidence of cancer in his tongue He will be observed

## 2017-12-22 NOTE — Assessment & Plan Note (Signed)
I encouraged him to abstain from alcohol abuse.  He does not seem interested at this time.  I encouraged his wife to attend Al-Anon meeting and for him to attend AA meetings.  He is not very receptive to this at this time.  He may be depressed but in light of his significant alcohol intake prescribing an antidepressant would be futile.  He will let me know if he decides to quit drinking.

## 2017-12-22 NOTE — Progress Notes (Unsigned)
Subjective:  Patient ID: Trevor Griffin, male    DOB: 1955/11/02  Age: 62 y.o. MRN: [id number]  CC: No chief complaint on file.   HPI Trevor Griffin presents for ***  Outpatient Medications Prior to Visit  Medication Sig Dispense Refill  . aspirin 81 MG tablet Take 1 tablet (81 mg total) by mouth daily. (Patient taking differently: Take 81 mg by mouth 2 (two) times daily. ) 30 tablet 1  . B Complex-C (B-COMPLEX WITH VITAMIN C) tablet Take 1 tablet daily by mouth. 30 tablet 0  . esomeprazole (NEXIUM) 20 MG capsule Take 20 mg by mouth daily.     . feeding supplement, ENSURE ENLIVE, (ENSURE ENLIVE) LIQD Take 237 mLs by mouth 4 (four) times daily. 120 Bottle 11  . Glycopyrrolate (LONHALA MAGNAIR REFILL KIT) 25 MCG/ML SOLN Inhale 1 Act into the lungs 2 (two) times daily. 60 mL 11  . levothyroxine (SYNTHROID, LEVOTHROID) 175 MCG tablet Take 1 tablet (175 mcg total) by mouth daily before breakfast. 90 tablet 1  . metoprolol tartrate (LOPRESSOR) 25 MG tablet Take 0.5 tablets (12.5 mg total) by mouth 2 (two) times daily. 180 tablet 1  . nitroGLYCERIN (NITROSTAT) 0.4 MG SL tablet Place 1 tablet (0.4 mg total) under the tongue every 5 (five) minutes as needed (up to 3 doses). For chest pain 25 tablet 3  . oxyCODONE (OXY IR/ROXICODONE) 5 MG immediate release tablet Take 1 tablet (5 mg total) by mouth every 6 (six) hours as needed for severe pain. 100 tablet 0   No facility-administered medications prior to visit.     ROS Review of Systems  Objective:  There were no vitals taken for this visit.  BP Readings from Last 3 Encounters:  12/22/17 118/70  12/22/17 122/64  11/26/17 (!) 146/79    Wt Readings from Last 3 Encounters:  12/22/17 164 lb 12 oz (74.7 kg)  12/22/17 165 lb (74.8 kg)  11/26/17 166 lb 14.4 oz (75.7 kg)    Physical Exam  Lab Results  Component Value Date   WBC 3.9 (L) 11/24/2017   HGB 14.4 11/24/2017   HCT 43.7 11/24/2017   PLT 148 11/24/2017   GLUCOSE 99  11/24/2017   CHOL 127 03/04/2017   TRIG 56 03/04/2017   HDL 35 (L) 03/04/2017   LDLDIRECT 148.4 10/18/2009   LDLCALC 81 03/04/2017   ALT 24 11/24/2017   AST 28 11/24/2017   NA 136 11/24/2017   K 4.1 11/24/2017   CL 100 11/24/2017   CREATININE 0.79 11/24/2017   BUN 6 (L) 11/24/2017   CO2 29 11/24/2017   TSH 10.98 (H) 12/22/2017   INR 1.24 03/18/2017   HGBA1C 4.2 (L) 04/16/2017    Nm Pet Image Restag (ps) Skull Base To Thigh  Result Date: 12/17/2017 CLINICAL DATA:  Subsequent treatment strategy for non-small cell lung cancer. EXAM: NUCLEAR MEDICINE PET SKULL BASE TO THIGH TECHNIQUE: 8 point sick mCi F-18 FDG was injected intravenously. Full-ring PET imaging was performed from the skull base to thigh after the radiotracer. CT data was obtained and used for attenuation correction and anatomic localization. Fasting blood glucose: 88 mg/dl COMPARISON:  PET-CT of 03/10/2013 and chest CT of 11/24/2017 FINDINGS: Mediastinal blood pool activity: SUV max 2.2 NECK: No significant abnormal hypermetabolic activity in this region. Incidental CT findings: Mucous retention cyst in the left maxillary sinus. Left mastoid effusion. Bilateral carotid atherosclerotic calcification. Postoperative findings along the right lateral tongue. CHEST: Cavitary 1.6 by 1.4 cm left upper lobe nodule  has a maximum standard uptake value of 6.9. Long segment esophageal activity up to maximum SUV 4.3, probably physiologic. Incidental CT findings: Right lower lobectomy. Coronary, aortic arch, and branch vessel atherosclerotic vascular disease. Fat density along the septum and apex of the left ventricle and extending into the anterior wall favoring prior myocardial infarction. ABDOMEN/PELVIS: No significant abnormal hypermetabolic activity in this region. Incidental CT findings: Diffuse hepatic steatosis. Cholelithiasis. 1.5 cm hypodense lesion of the right mid kidney is photopenic and accordingly likely a cyst. Aortoiliac  atherosclerotic vascular disease. Descending and proximal sigmoid colon diverticulosis. Right scrotal hydrocele. SKELETON: Subtle focus of accentuated activity in the left anterolateral sixth rib, maximum SUV 3.1, no corresponding CT findings, contralateral side maximum SUV 2.1. Incidental CT findings: Degenerative arthropathy of the hips, right greater than left. IMPRESSION: 1. The cavitary 1.6 cm left upper lobe nodule has a maximum SUV of 6.9, compatible with malignancy. 2. Faint focus of accentuated activity in the left anterolateral sixth rib with maximum SUV 3.1, slightly asymmetric. No underlying appreciable fracture or underlying CT abnormality. This may well simply be incidental but I would suggest attention to the left anterolateral sixth rib on any follow up imaging. 3. Other imaging findings of potential clinical significance: Mucous retention cyst in left maxillary sinus. Left mastoid effusion. Aortic Atherosclerosis (ICD10-I70.0). Coronary atherosclerosis. Prior right lower lobectomy. Prior myocardial infarction with fat density in the left ventricular myocardial. Descending and proximal sigmoid colon diverticulosis. Right scrotal hydrocele. Cholelithiasis. Diffuse hepatic steatosis. Right mid kidney cyst. Electronically Signed   By: Van Clines M.D.   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) -     dronabinol (MARINOL) 2.5 MG capsule; Take 1 capsule (2.5 mg total) by mouth 2 (two) times daily before lunch and supper.   I am having Trevor Griffin. Bartelt start on dronabinol. I am also having him maintain his aspirin, nitroGLYCERIN, esomeprazole, B-complex with vitamin C, metoprolol tartrate, Glycopyrrolate, feeding supplement (ENSURE ENLIVE), levothyroxine, and oxyCODONE.  Meds ordered this encounter  Medications  . dronabinol (MARINOL) 2.5 MG capsule    Sig: Take 1 capsule (2.5 mg total) by mouth 2 (two) times daily before  lunch and supper.    Dispense:  60 capsule    Refill:  0     Follow-up: No follow-ups on file.  Scarlette Calico, MD

## 2017-12-22 NOTE — Assessment & Plan Note (Signed)
We had extensive discussion over the past few years about the importance of nicotine cessation He is down to 2 cigarettes/day I encouraged her effort and recommend he continues smoking cessation effort before his planned surgery

## 2017-12-28 ENCOUNTER — Telehealth: Payer: Self-pay | Admitting: *Deleted

## 2017-12-28 NOTE — Telephone Encounter (Signed)
Dr Hendrickson's office called. He is going to be seen on 9/6 @ 2:30pm.   Dr Roxan Hockey is requesting that we get PFTs prior to that visit.

## 2018-01-01 ENCOUNTER — Encounter: Payer: Self-pay | Admitting: Internal Medicine

## 2018-01-01 ENCOUNTER — Ambulatory Visit (INDEPENDENT_AMBULATORY_CARE_PROVIDER_SITE_OTHER): Payer: Medicare Other | Admitting: Internal Medicine

## 2018-01-01 ENCOUNTER — Ambulatory Visit (INDEPENDENT_AMBULATORY_CARE_PROVIDER_SITE_OTHER)
Admission: RE | Admit: 2018-01-01 | Discharge: 2018-01-01 | Disposition: A | Discharge status: Home / Self Care | Payer: Medicare Other | Source: Ambulatory Visit | Attending: Internal Medicine | Admitting: Internal Medicine

## 2018-01-01 VITALS — BP 120/62 | HR 71 | Temp 97.4°F | Ht 68.0 in | Wt 164.0 lb

## 2018-01-01 DIAGNOSIS — Z23 Encounter for immunization: Secondary | ICD-10-CM

## 2018-01-01 DIAGNOSIS — M25511 Pain in right shoulder: Secondary | ICD-10-CM

## 2018-01-01 DIAGNOSIS — S4991XA Unspecified injury of right shoulder and upper arm, initial encounter: Secondary | ICD-10-CM | POA: Diagnosis not present

## 2018-01-01 MED ORDER — METHYLPREDNISOLONE ACETATE 40 MG/ML IJ SUSP
40.0000 mg | Freq: Once | INTRAMUSCULAR | Status: AC
  Administered 2018-01-01: 40 mg via INTRAMUSCULAR

## 2018-01-01 MED ORDER — CYCLOBENZAPRINE HCL 5 MG PO TABS: 5.0000 mg | ORAL_TABLET | Freq: Two times a day (BID) | ORAL | 0 refills | Status: DC | PRN

## 2018-01-01 MED ORDER — KETOROLAC TROMETHAMINE 30 MG/ML IJ SOLN
30.0000 mg | Freq: Once | INTRAMUSCULAR | Status: AC
  Administered 2018-01-01: 30 mg via INTRAMUSCULAR

## 2018-01-01 NOTE — Assessment & Plan Note (Signed)
X-ray right shoulder ordered to rule out dislocation. Read by this MD as no fracture and no dislocation. Rx for flexeril and depo-medrol 40 mg IM and toradol 30 mg IM given at visit.

## 2018-01-01 NOTE — Patient Instructions (Addendum)
Your shoulder x-ray is not dislocated on the x-ray. We will wait for the final read from the radiologist but no breaks in the bones and they are all in place.   You can keep using the cream and oxycodone for the pain.   We have sent in flexeril for the pain to use up to twice a day if needed.

## 2018-01-01 NOTE — Progress Notes (Signed)
   Subjective:    Patient ID: Trevor Griffin, male    DOB: November 27, 1955, 62 y.o.   MRN: [id number]  HPI The patient is a 62 YO man coming in for fall about 62 weeks ago. Was yanked by his dog and fell. Hurt his right shoulder and it is hurting since that time. It has been hurting about 2/10 pain. Yesterday he was moving boxes and the right shoulder starting hurting a lot worse. Now 10/10 pain. Took his oxycodone he gets for other pain and it did not help much. Did use a cream on it this morning and this helped some. He is worried about it being dislocated. Denies numbness or weakness in his arms. Denies swelling in the shoulder afterwards. Some tingling in his fingers on the right hand.   Review of Systems  Constitutional: Positive for activity change. Negative for appetite change, fatigue, fever and unexpected weight change.  HENT: Negative.   Eyes: Negative.   Respiratory: Negative.   Cardiovascular: Negative.   Musculoskeletal: Positive for arthralgias and myalgias. Negative for back pain.  Skin: Negative.   Neurological: Negative for syncope, weakness and numbness.  Psychiatric/Behavioral: Negative.       Objective:   Physical Exam  Constitutional: He is oriented to person, place, and time. He appears well-developed and well-nourished.  HENT:  Head: Normocephalic and atraumatic.  Eyes: EOM are normal.  Neck: Normal range of motion.  Cardiovascular: Normal rate and regular rhythm.  Pulmonary/Chest: Effort normal and breath sounds normal. No respiratory distress. He has no wheezes. He has no rales.  Abdominal: Soft. Bowel sounds are normal. He exhibits no distension. There is no tenderness. There is no rebound.  Musculoskeletal: He exhibits tenderness. He exhibits no edema.  Pain over the right AC joint, no dislocation appreciated on exam. Normal pulses at the radial pulse right. No pain in the scapula region or neck region on the right. No pain over the scapula.   Neurological: He is  alert and oriented to person, place, and time. Coordination normal.  Skin: Skin is warm and dry.  Psychiatric: He has a normal mood and affect.   Vitals:   01/01/18 0955  BP: 120/62  Pulse: 71  Temp: (!) 97.4 F (36.3 C)  TempSrc: Axillary  SpO2: 99%  Weight: 164 lb (74.4 kg)  Height: 5\' 8"  (1.727 m)   X-ray right shoulder independently reviewed and read by this MD as no dislocation and no fractures and patient informed of such    Assessment & Plan:  Flu shot given at visit, toradol 30 mg IM depo-medrol 40 mg IM given at visit

## 2018-01-07 ENCOUNTER — Ambulatory Visit (INDEPENDENT_AMBULATORY_CARE_PROVIDER_SITE_OTHER): Payer: Medicare Other | Admitting: Thoracic Surgery (Cardiothoracic Vascular Surgery)

## 2018-01-07 ENCOUNTER — Other Ambulatory Visit: Payer: Self-pay | Admitting: *Deleted

## 2018-01-07 ENCOUNTER — Other Ambulatory Visit: Payer: Self-pay | Admitting: Thoracic Surgery (Cardiothoracic Vascular Surgery)

## 2018-01-07 VITALS — BP 128/71 | HR 102 | Resp 20 | Ht 68.0 in | Wt 162.0 lb

## 2018-01-07 DIAGNOSIS — Z85118 Personal history of other malignant neoplasm of bronchus and lung: Secondary | ICD-10-CM | POA: Diagnosis not present

## 2018-01-07 DIAGNOSIS — R911 Solitary pulmonary nodule: Secondary | ICD-10-CM

## 2018-01-07 DIAGNOSIS — J449 Chronic obstructive pulmonary disease, unspecified: Secondary | ICD-10-CM

## 2018-01-07 DIAGNOSIS — Z85810 Personal history of malignant neoplasm of tongue: Secondary | ICD-10-CM | POA: Diagnosis not present

## 2018-01-07 DIAGNOSIS — J441 Chronic obstructive pulmonary disease with (acute) exacerbation: Secondary | ICD-10-CM

## 2018-01-07 NOTE — Progress Notes (Signed)
PCP is Janith Lima, MD Referring Provider is Heath Lark, MD  Chief Complaint  Patient presents with  . 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  He is followed by Dr. Alvy Bimler.  In July he had a CT of the chest which showed a cavitary left upper lobe lesion.  Previous CT in November 2018 showed some abnormality in this area but this had definitely changed.  A PET/CT showed the area was markedly hypermetabolic.  There was no other significant metabolic activity.  He continues to smoke.  He says he is down to 2 or 3 cigarettes daily.  He denies any significant recent weight loss.  His appetite is good.  He does have chronic dry mouth due to surgery and radiation.  He has a chronic cough.  He denies shortness of breath or wheezing. Zubrod Score: At the time of surgery this patient's most appropriate activity status/level should be described as: []     0    Normal activity, no symptoms [x]     1    Restricted in physical strenuous activity but ambulatory, able to do out light work []     2    Ambulatory and capable of self care, unable to do work activities, up and about >50 % of waking hours                              []     3    Only limited self care, in bed greater than 50% of waking hours []     4    Completely disabled, no self care, confined to bed or chair []     5    Moribund  Past Medical History:  Diagnosis Date  . Acute ST  elevation myocardial infarction (STEMI) involving left anterior descending (LAD) coronary artery ([place]) 2008 and 2009   5/'08 - DES x 4 to p-m LAD; 2/'09 - PTCA of  in-stent thrombosis  . Alcohol abuse   . Anxiety   . CAD S/P percutaneous coronary angioplasty 2008   a. Ant STEMI 5/08 w/ 4 Taxus DES p-m LAD (3.0 x 16,16,24,28 mm) ;  b. 2/'09:  Stopped Plavix-> Ant STEMI/stent thrombosis in LAD (PTCA - 3.8 mm), LCX 65md;  c. 10/12: Cath/PCI: p-m LAD stents 10-20% ISR, mLAD 40%, CFX 30-40%, mCFX 90% (Promus Premier DES 3.0 mm x 20 mm - 3.268m), EF 35%; d. cath 2/17/'16 no Sig CAD, patent LAD & Cx stents, EF 40-45%  . Chronic pain   . COPD (chronic obstructive pulmonary disease) ([facility]  . Depression   . Fatigue    Sleep study 6/08 was negative. Sleep study 5/13 showed no OSA.  . Marland KitchenERD (gastroesophageal reflux disease)   . Headache(784.0)    migraines  . Hx of radiation therapy 10/25/12-12/10/12    Right Tongue/ Bilateral Neck //Total Dose, 60 Gy in 30 Fractions  . Hyperlipidemia   . Hypertension   .  Ischemic cardiomyopathy    a. Echo 08/20/11 with EF 50-55% (previously 35%);  b. 02/2014 Echo: EF 45-50-%, mid-apicalanteroseptal AK, Gr 1 dd, mild AI, midlly dil LA.  . Lung cancer ([place]) dx'd 2015  . Obesity   . Tobacco abuse   . Tongue cancer ([place]) dx'd 2014   s/p resection - [facility]    Past Surgical History:  Procedure Laterality Date  . COLONOSCOPY N/A 11/25/2016   Procedure: COLONOSCOPY;  Surgeon: Clarene Essex, MD;  Location: [facility];  Service: Endoscopy;  Laterality: N/A;  . CORONARY ANGIOPLASTY WITH STENT PLACEMENT  2008-2012   a. 5/'08: Anterior STEMI: Proximal-mid LAD PCI: 4 total Taxus DES stents 3.0 mm x  16 mm, 16 mm, 24 mm and 28 mm.( post-dil 3.5 mm);; b. 2/'09: PTCA LAD in-stent thrombosis - 3.8 mm;; c. 10/'12: mCx Promus Element DES 3.0 x 20 mm- post-dil - 3.25 mm. -> patent in 2016  . ESOPHAGOGASTRODUODENOSCOPY (EGD) WITH PROPOFOL N/A 11/25/2016   Procedure:  ESOPHAGOGASTRODUODENOSCOPY (EGD) WITH PROPOFOL;  Surgeon: Clarene Essex, MD;  Location: [facility];  Service: Endoscopy;  Laterality: N/A;  neonatal scope  . FRACTURE SURGERY Right    as a child,leg  . LEFT HEART CATH AND CORONARY ANGIOGRAPHY  2006-2012   a) 2006: minimal CAD, normal EF;; b) 5/'08 - Ant STEMI - 100% p-mLAD (DES x 4 in LAD);; c)2/'09: Ant STEMI for IST/thrombosis of LAD ->PTCA, 80% mCx;; 10/'12: mCx 80-90% - DES PCI, patent LAD stents, EF ~35% - anterolateral AK, apical severe HK.  Marland Kitchen LEFT HEART CATHETERIZATION WITH CORONARY ANGIOGRAM N/A 06/21/2014   Procedure: LEFT HEART CATHETERIZATION WITH CORONARY ANGIOGRAM;  Surgeon: Burnell Blanks, MD;  Location[facility] CATH LAB: No evidence of obstructive disease.  Patent LAD and circumflex stents.  EF 40-45%.  . LOBECTOMY Right 06/06/2013   Procedure: LOBECTOMY;  Surgeon: Melrose Nakayama, MD;  Location: [facility];  Service: Thoracic;  Laterality: Right;  . TRANSTHORACIC ECHOCARDIOGRAM  02/2014   EF 45-50%.  Akinesis of the mid apical anteroseptal wall.  GR 1 DD.  Mild MR.  . VIDEO ASSISTED THORACOSCOPY (VATS)/WEDGE RESECTION Right 06/06/2013   Procedure: VIDEO ASSISTED THORACOSCOPY (VATS)/WEDGE RESECTION;  Surgeon: Melrose Nakayama, MD;  Location: [facility];  Service: Thoracic;  Laterality: Right;    Family History  Problem Relation Age of Onset  . Kidney disease Mother        CHRONIC  . Cancer Sister   . Liver disease Sister        LIVER FAILURE  . Heart attack Brother     Social History Social History   Tobacco Use  . Smoking status: Current Every Day Smoker    Packs/day: 0.50    Years: 46.00    Pack years: 23.00    Types: Cigars  . Smokeless tobacco: Never Used  . Tobacco comment: patient delcines cessation materials  Substance Use Topics  . Alcohol use: Yes    Alcohol/week: 63.0 standard drinks    Types: 63 Cans of beer per week  . Drug use: No    Current Outpatient Medications  Medication Sig Dispense Refill   . aspirin 81 MG tablet Take 1 tablet (81 mg total) by mouth daily. (Patient taking differently: Take 81 mg by mouth 2 (two) times daily. ) 30 tablet 1  . B Complex-C (B-COMPLEX WITH VITAMIN C) tablet Take 1 tablet daily by mouth. 30 tablet 0  . cyclobenzaprine (FLEXERIL) 5 MG tablet Take 1 tablet (5 mg total) by mouth 2 (two)  times daily as needed for muscle spasms. 30 tablet 0  . dronabinol (MARINOL) 2.5 MG capsule Take 1 capsule (2.5 mg total) by mouth 2 (two) times daily before lunch and supper. 60 capsule 0  . esomeprazole (NEXIUM) 20 MG capsule Take 20 mg by mouth daily.     . feeding supplement, ENSURE ENLIVE, (ENSURE ENLIVE) LIQD Take 237 mLs by mouth 4 (four) times daily. 120 Bottle 11  . Glycopyrrolate (LONHALA MAGNAIR REFILL KIT) 25 MCG/ML SOLN Inhale 1 Act into the lungs 2 (two) times daily. 60 mL 11  . levothyroxine (SYNTHROID, LEVOTHROID) 175 MCG tablet Take 1 tablet (175 mcg total) by mouth daily before breakfast. 90 tablet 1  . metoprolol tartrate (LOPRESSOR) 25 MG tablet Take 0.5 tablets (12.5 mg total) by mouth 2 (two) times daily. 180 tablet 1  . nitroGLYCERIN (NITROSTAT) 0.4 MG SL tablet Place 1 tablet (0.4 mg total) under the tongue every 5 (five) minutes as needed (up to 3 doses). For chest pain 25 tablet 3  . oxyCODONE (OXY IR/ROXICODONE) 5 MG immediate release tablet Take 1 tablet (5 mg total) by mouth every 6 (six) hours as needed for severe pain. 100 tablet 0   No current facility-administered medications for this visit.     Allergies  Allergen Reactions  . Hydromorphone Nausea Only and Other (See Comments)    Headaches also  . Nubain [Nalbuphine Hcl] Nausea Only and Other (See Comments)    Headaches also    Review of Systems  Constitutional: Negative for activity change and unexpected weight change.  HENT: Negative for trouble swallowing.        Dry mouth  Respiratory: Positive for cough and shortness of breath.   Cardiovascular: Negative for chest pain and  palpitations.    BP 128/71   Pulse (!) 102   Resp 20   Ht 5' 8"  (1.727 m)   Wt 162 lb (73.5 kg)   SpO2 96% Comment: RA  BMI 24.63 kg/m  Physical Exam  Constitutional: He is oriented to person, place, and time. He appears well-developed and well-nourished. No distress.  HENT:  Head: Normocephalic.  Deformity from prior surgery  Eyes: Conjunctivae and EOM are normal.  Neck: No tracheal deviation present. No thyromegaly present.  Well healed scars  Cardiovascular: Normal rate and regular rhythm. Exam reveals no gallop and no friction rub.  No murmur heard. Pulmonary/Chest: No respiratory distress. He has no wheezes.  Diminished BS right base  Abdominal: Soft. He exhibits no distension. There is no tenderness.  Musculoskeletal: He exhibits no edema.  Lymphadenopathy:    He has no cervical adenopathy.  Neurological: He is alert and oriented to person, place, and time. No cranial nerve deficit. He exhibits normal muscle tone. Coordination normal.  Skin: Skin is warm and dry.  Vitals reviewed.    Diagnostic Tests: NUCLEAR MEDICINE PET SKULL BASE TO THIGH  TECHNIQUE: 8 point sick mCi F-18 FDG was injected intravenously. Full-ring PET imaging was performed from the skull base to thigh after the radiotracer. CT data was obtained and used for attenuation correction and anatomic localization.  Fasting blood glucose: 88 mg/dl  COMPARISON:  PET-CT of 03/10/2013 and chest CT of 11/24/2017  FINDINGS: Mediastinal blood pool activity: SUV max 2.2  NECK: No significant abnormal hypermetabolic activity in this region.  Incidental CT findings: Mucous retention cyst in the left maxillary sinus. Left mastoid effusion. Bilateral carotid atherosclerotic calcification. Postoperative findings along the right lateral tongue.  CHEST: Cavitary 1.6 by 1.4 cm left  upper lobe nodule has a maximum standard uptake value of 6.9. Long segment esophageal activity up to maximum SUV 4.3,  probably physiologic.  Incidental CT findings: Right lower lobectomy. Coronary, aortic arch, and branch vessel atherosclerotic vascular disease. Fat density along the septum and apex of the left ventricle and extending into the anterior wall favoring prior myocardial infarction.  ABDOMEN/PELVIS: No significant abnormal hypermetabolic activity in this region.  Incidental CT findings: Diffuse hepatic steatosis. Cholelithiasis. 1.5 cm hypodense lesion of the right mid kidney is photopenic and accordingly likely a cyst. Aortoiliac atherosclerotic vascular disease. Descending and proximal sigmoid colon diverticulosis. Right scrotal hydrocele.  SKELETON: Subtle focus of accentuated activity in the left anterolateral sixth rib, maximum SUV 3.1, no corresponding CT findings, contralateral side maximum SUV 2.1.  Incidental CT findings: Degenerative arthropathy of the hips, right greater than left.  IMPRESSION: 1. The cavitary 1.6 cm left upper lobe nodule has a maximum SUV of 6.9, compatible with malignancy. 2. Faint focus of accentuated activity in the left anterolateral sixth rib with maximum SUV 3.1, slightly asymmetric. No underlying appreciable fracture or underlying CT abnormality. This may well simply be incidental but I would suggest attention to the left anterolateral sixth rib on any follow up imaging. 3. Other imaging findings of potential clinical significance: Mucous retention cyst in left maxillary sinus. Left mastoid effusion. Aortic Atherosclerosis (ICD10-I70.0). Coronary atherosclerosis. Prior right lower lobectomy. Prior myocardial infarction with fat density in the left ventricular myocardial. Descending and proximal sigmoid colon diverticulosis. Right scrotal hydrocele. Cholelithiasis. Diffuse hepatic steatosis. Right mid kidney cyst.   Electronically Signed   By: Van Clines M.D.   On: 12/17/2017 15:12   Impression: Trevor Griffin is a  62 year old man with an extensive past medical history.  He has a history of ongoing tobacco abuse despite 2 previous heart attacks, multiple cardiac interventions, tongue cancer, and lung cancer.  He now has a new cavitary nodule in the superior aspect of the lingular segment of the left upper lobe.  This is highly suspicious for new primary bronchogenic carcinoma.  Infectious, inflammatory and metastatic lesions are also within the differential.  He needs a biopsy to determine the etiology of the lesion.  I discussed 2 potential treatment options with him.  One would be surgical resection.  He would not be a candidate for a lobectomy but we could consider doing a segmentectomy.  A second option would be to start radiation.  This is a relatively favorable lesion for that approach.  But may not have quite as good a cure of his surgical resection, it might be a better option in him given his extensive comorbidities and previous contralateral lobectomy.  We discussed the relative advantages and disadvantages of each of those approaches.  He is far more interested in radiation therapy than a major operation.  I discussed doing a navigational bronchoscopy for biopsy and fiducial placement for diagnosis prior to referral for radiation therapy.  He understands this will be done in the operating room under general anesthesia.  We would do this on an outpatient basis.  He understands that we cannot give a guarantee of a definitive diagnosis.  The anatomy is favorable given an airway in close proximity to the lesion.  He likely will need a new CT of the chest with super D protocol for mapping.  I informed him of the indications, risk, benefits, and alternatives.  He understands the risks include, but are not limited to those associated with general anesthesia such as MI,  stroke, blood clots, and, in rare instances, death, as well as bleeding, pneumothorax, and failure to make a diagnosis.  He wishes to  proceed.  Plan: CT chest super D protocol ENB with fiducial placement Friday 01/15/2018  Melrose Nakayama, MD [facility] [phone]

## 2018-01-07 NOTE — H&P (View-Only) (Signed)
PCP is Janith Lima, MD Referring Provider is Heath Lark, MD  Chief Complaint  Patient presents with  . 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  Trevor Griffin.  In July Trevor had a CT of the chest which showed a cavitary left upper lobe lesion.  Previous CT in November 2018 showed some abnormality in this area but this had definitely changed.  A PET/CT showed the area was markedly hypermetabolic.  There was no other significant metabolic activity.  Trevor continues to smoke.  Trevor says Trevor is down to 2 or 3 cigarettes daily.  Trevor denies any significant recent weight loss.  His appetite is good.  Trevor does have chronic dry mouth due to surgery and radiation.  Trevor has a chronic cough.  Trevor denies shortness of breath or wheezing. Zubrod Score: At the time of surgery this patient's most appropriate activity status/level should be described as: []     0    Normal activity, no symptoms [x]     1    Restricted in physical strenuous activity but ambulatory, able to do out light work []     2    Ambulatory and capable of self care, unable to do work activities, up and about >50 % of waking hours                              []     3    Only limited self care, in bed greater than 50% of waking hours []     4    Completely disabled, no self care, confined to bed or chair []     5    Moribund  Past Medical History:  Diagnosis Date  . Acute ST  elevation myocardial infarction (STEMI) involving left anterior descending (LAD) coronary artery ([place]) 2008 and 2009   5/'08 - DES x 4 to p-m LAD; 2/'09 - PTCA of  in-stent thrombosis  . Alcohol abuse   . Anxiety   . CAD S/P percutaneous coronary angioplasty 2008   a. Ant STEMI 5/08 w/ 4 Taxus DES p-m LAD (3.0 x 16,16,24,28 mm) ;  b. 2/'09:  Stopped Plavix-> Ant STEMI/stent thrombosis in LAD (PTCA - 3.8 mm), LCX 58md;  c. 10/12: Cath/PCI: p-m LAD stents 10-20% ISR, mLAD 40%, CFX 30-40%, mCFX 90% (Promus Premier DES 3.0 mm x 20 mm - 3.243m), EF 35%; d. cath 2/17/'16 no Sig CAD, patent LAD & Cx stents, EF 40-45%  . Chronic pain   . COPD (chronic obstructive pulmonary disease) ([place]  . Depression   . Fatigue    Sleep study 6/08 was negative. Sleep study 5/13 showed no OSA.  . Marland KitchenERD (gastroesophageal reflux disease)   . Headache(784.0)    migraines  . Hx of radiation therapy 10/25/12-12/10/12    Right Tongue/ Bilateral Neck //Total Dose, 60 Gy in 30 Fractions  . Hyperlipidemia   . Hypertension   .  Ischemic cardiomyopathy    a. Echo 08/20/11 with EF 50-55% (previously 35%);  b. 02/2014 Echo: EF 45-50-%, mid-apicalanteroseptal AK, Gr 1 dd, mild AI, midlly dil LA.  . Lung cancer ([place]) dx'd 2015  . Obesity   . Tobacco abuse   . Tongue cancer ([place]) dx'd 2014   s/p resection - [facility]    Past Surgical History:  Procedure Laterality Date  . COLONOSCOPY N/A 11/25/2016   Procedure: COLONOSCOPY;  Surgeon: Clarene Essex, MD;  Location: [facility];  Service: Endoscopy;  Laterality: N/A;  . CORONARY ANGIOPLASTY WITH STENT PLACEMENT  2008-2012   a. 5/'08: Anterior STEMI: Proximal-mid LAD PCI: 4 total Taxus DES stents 3.0 mm x  16 mm, 16 mm, 24 mm and 28 mm.( post-dil 3.5 mm);; b. 2/'09: PTCA LAD in-stent thrombosis - 3.8 mm;; c. 10/'12: mCx Promus Element DES 3.0 x 20 mm- post-dil - 3.25 mm. -> patent in 2016  . ESOPHAGOGASTRODUODENOSCOPY (EGD) WITH PROPOFOL N/A 11/25/2016   Procedure:  ESOPHAGOGASTRODUODENOSCOPY (EGD) WITH PROPOFOL;  Surgeon: Clarene Essex, MD;  Location: [facility];  Service: Endoscopy;  Laterality: N/A;  neonatal scope  . FRACTURE SURGERY Right    as a child,leg  . LEFT HEART CATH AND CORONARY ANGIOGRAPHY  2006-2012   a) 2006: minimal CAD, normal EF;; b) 5/'08 - Ant STEMI - 100% p-mLAD (DES x 4 in LAD);; c)2/'09: Ant STEMI for IST/thrombosis of LAD ->PTCA, 80% mCx;; 10/'12: mCx 80-90% - DES PCI, patent LAD stents, EF ~35% - anterolateral AK, apical severe HK.  Marland Kitchen LEFT HEART CATHETERIZATION WITH CORONARY ANGIOGRAM N/A 06/21/2014   Procedure: LEFT HEART CATHETERIZATION WITH CORONARY ANGIOGRAM;  Surgeon: Burnell Blanks, MD;  Location: [facility] CATH LAB: No evidence of obstructive disease.  Patent LAD and circumflex stents.  EF 40-45%.  . LOBECTOMY Right 06/06/2013   Procedure: LOBECTOMY;  Surgeon: Melrose Nakayama, MD;  Location: [place];  Service: Thoracic;  Laterality: Right;  . TRANSTHORACIC ECHOCARDIOGRAM  02/2014   EF 45-50%.  Akinesis of the mid apical anteroseptal wall.  GR 1 DD.  Mild MR.  . VIDEO ASSISTED THORACOSCOPY (VATS)/WEDGE RESECTION Right 06/06/2013   Procedure: VIDEO ASSISTED THORACOSCOPY (VATS)/WEDGE RESECTION;  Surgeon: Melrose Nakayama, MD;  Location: [place];  Service: Thoracic;  Laterality: Right;    Family History  Problem Relation Age of Onset  . Kidney disease Mother        CHRONIC  . Cancer Sister   . Liver disease Sister        LIVER FAILURE  . Heart attack Brother     Social History Social History   Tobacco Use  . Smoking status: Current Every Day Smoker    Packs/day: 0.50    Years: 46.00    Pack years: 23.00    Types: Cigars  . Smokeless tobacco: Never Used  . Tobacco comment: patient delcines cessation materials  Substance Use Topics  . Alcohol use: Yes    Alcohol/week: 63.0 standard drinks    Types: 63 Cans of beer per week  . Drug use: No    Current Outpatient Medications  Medication Sig Dispense Refill   . aspirin 81 MG tablet Take 1 tablet (81 mg total) by mouth daily. (Patient taking differently: Take 81 mg by mouth 2 (two) times daily. ) 30 tablet 1  . B Complex-C (B-COMPLEX WITH VITAMIN C) tablet Take 1 tablet daily by mouth. 30 tablet 0  . cyclobenzaprine (FLEXERIL) 5 MG tablet Take 1 tablet (5 mg total) by mouth 2 (two)  times daily as needed for muscle spasms. 30 tablet 0  . dronabinol (MARINOL) 2.5 MG capsule Take 1 capsule (2.5 mg total) by mouth 2 (two) times daily before lunch and supper. 60 capsule 0  . esomeprazole (NEXIUM) 20 MG capsule Take 20 mg by mouth daily.     . feeding supplement, ENSURE ENLIVE, (ENSURE ENLIVE) LIQD Take 237 mLs by mouth 4 (four) times daily. 120 Bottle 11  . Glycopyrrolate (LONHALA MAGNAIR REFILL KIT) 25 MCG/ML SOLN Inhale 1 Act into the lungs 2 (two) times daily. 60 mL 11  . levothyroxine (SYNTHROID, LEVOTHROID) 175 MCG tablet Take 1 tablet (175 mcg total) by mouth daily before breakfast. 90 tablet 1  . metoprolol tartrate (LOPRESSOR) 25 MG tablet Take 0.5 tablets (12.5 mg total) by mouth 2 (two) times daily. 180 tablet 1  . nitroGLYCERIN (NITROSTAT) 0.4 MG SL tablet Place 1 tablet (0.4 mg total) under the tongue every 5 (five) minutes as needed (up to 3 doses). For chest pain 25 tablet 3  . oxyCODONE (OXY IR/ROXICODONE) 5 MG immediate release tablet Take 1 tablet (5 mg total) by mouth every 6 (six) hours as needed for severe pain. 100 tablet 0   No current facility-administered medications for this visit.     Allergies  Allergen Reactions  . Hydromorphone Nausea Only and Other (See Comments)    Headaches also  . Nubain [Nalbuphine Hcl] Nausea Only and Other (See Comments)    Headaches also    Review of Systems  Constitutional: Negative for activity change and unexpected weight change.  HENT: Negative for trouble swallowing.        Dry mouth  Respiratory: Positive for cough and shortness of breath.   Cardiovascular: Negative for chest pain and  palpitations.    BP 128/71   Pulse (!) 102   Resp 20   Ht 5' 8"  (1.727 m)   Wt 162 lb (73.5 kg)   SpO2 96% Comment: RA  BMI 24.63 kg/m  Physical Exam  Constitutional: Trevor is oriented to person, place, and time. Trevor appears well-developed and well-nourished. No distress.  HENT:  Head: Normocephalic.  Deformity from prior surgery  Eyes: Conjunctivae and EOM are normal.  Neck: No tracheal deviation present. No thyromegaly present.  Well healed scars  Cardiovascular: Normal rate and regular rhythm. Exam reveals no gallop and no friction rub.  No murmur heard. Pulmonary/Chest: No respiratory distress. Trevor has no wheezes.  Diminished BS right base  Abdominal: Soft. Trevor exhibits no distension. There is no tenderness.  Musculoskeletal: Trevor exhibits no edema.  Lymphadenopathy:    Trevor has no cervical adenopathy.  Neurological: Trevor is alert and oriented to person, place, and time. No cranial nerve deficit. Trevor exhibits normal muscle tone. Coordination normal.  Skin: Skin is warm and dry.  Vitals reviewed.    Diagnostic Tests: NUCLEAR MEDICINE PET SKULL BASE TO THIGH  TECHNIQUE: 8 point sick mCi F-18 FDG was injected intravenously. Full-ring PET imaging was performed from the skull base to thigh after the radiotracer. CT data was obtained and used for attenuation correction and anatomic localization.  Fasting blood glucose: 88 mg/dl  COMPARISON:  PET-CT of 03/10/2013 and chest CT of 11/24/2017  FINDINGS: Mediastinal blood pool activity: SUV max 2.2  NECK: No significant abnormal hypermetabolic activity in this region.  Incidental CT findings: Mucous retention cyst in the left maxillary sinus. Left mastoid effusion. Bilateral carotid atherosclerotic calcification. Postoperative findings along the right lateral tongue.  CHEST: Cavitary 1.6 by 1.4 cm left  upper lobe nodule has a maximum standard uptake value of 6.9. Long segment esophageal activity up to maximum SUV 4.3,  probably physiologic.  Incidental CT findings: Right lower lobectomy. Coronary, aortic arch, and branch vessel atherosclerotic vascular disease. Fat density along the septum and apex of the left ventricle and extending into the anterior wall favoring prior myocardial infarction.  ABDOMEN/PELVIS: No significant abnormal hypermetabolic activity in this region.  Incidental CT findings: Diffuse hepatic steatosis. Cholelithiasis. 1.5 cm hypodense lesion of the right mid kidney is photopenic and accordingly likely a cyst. Aortoiliac atherosclerotic vascular disease. Descending and proximal sigmoid colon diverticulosis. Right scrotal hydrocele.  SKELETON: Subtle focus of accentuated activity in the left anterolateral sixth rib, maximum SUV 3.1, no corresponding CT findings, contralateral side maximum SUV 2.1.  Incidental CT findings: Degenerative arthropathy of the hips, right greater than left.  IMPRESSION: 1. The cavitary 1.6 cm left upper lobe nodule has a maximum SUV of 6.9, compatible with malignancy. 2. Faint focus of accentuated activity in the left anterolateral sixth rib with maximum SUV 3.1, slightly asymmetric. No underlying appreciable fracture or underlying CT abnormality. This may well simply be incidental but I would suggest attention to the left anterolateral sixth rib on any follow up imaging. 3. Other imaging findings of potential clinical significance: Mucous retention cyst in left maxillary sinus. Left mastoid effusion. Aortic Atherosclerosis (ICD10-I70.0). Coronary atherosclerosis. Prior right lower lobectomy. Prior myocardial infarction with fat density in the left ventricular myocardial. Descending and proximal sigmoid colon diverticulosis. Right scrotal hydrocele. Cholelithiasis. Diffuse hepatic steatosis. Right mid kidney cyst.   Electronically Signed   By: Van Clines M.D.   On: 12/17/2017 15:12   Impression: Trevor Griffin is a  62 year old man with an extensive past medical history.  Trevor has a history of ongoing tobacco abuse despite 2 previous heart attacks, multiple cardiac interventions, tongue cancer, and lung cancer.  Trevor now has a new cavitary nodule in the superior aspect of the lingular segment of the left upper lobe.  This is highly suspicious for new primary bronchogenic carcinoma.  Infectious, inflammatory and metastatic lesions are also within the differential.  Trevor needs a biopsy to determine the etiology of the lesion.  I discussed 2 potential treatment options with him.  One would be surgical resection.  Trevor would not be a candidate for a lobectomy but we could consider doing a segmentectomy.  A second option would be to start radiation.  This is a relatively favorable lesion for that approach.  But may not have quite as good a cure of his surgical resection, it might be a better option in him given his extensive comorbidities and previous contralateral lobectomy.  We discussed the relative advantages and disadvantages of each of those approaches.  Trevor is far more interested in radiation therapy than a major operation.  I discussed doing a navigational bronchoscopy for biopsy and fiducial placement for diagnosis prior to referral for radiation therapy.  Trevor understands this will be done in the operating room under general anesthesia.  We would do this on an outpatient basis.  Trevor understands that we cannot give a guarantee of a definitive diagnosis.  The anatomy is favorable given an airway in close proximity to the lesion.  Trevor likely will need a new CT of the chest with super D protocol for mapping.  I informed him of the indications, risk, benefits, and alternatives.  Trevor understands the risks include, but are not limited to those associated with general anesthesia such as MI,  stroke, blood clots, and, in rare instances, death, as well as bleeding, pneumothorax, and failure to make a diagnosis.  Trevor wishes to  proceed.  Plan: CT chest super D protocol ENB with fiducial placement Friday 01/15/2018  Melrose Nakayama, MD [facility] [phone]

## 2018-01-08 ENCOUNTER — Ambulatory Visit: Payer: Medicare Other | Admitting: Thoracic Surgery (Cardiothoracic Vascular Surgery)

## 2018-01-12 ENCOUNTER — Ambulatory Visit
Admission: RE | Admit: 2018-01-12 | Discharge: 2018-01-12 | Disposition: A | Discharge status: Home / Self Care | Payer: Medicare Other | Source: Ambulatory Visit | Attending: Thoracic Surgery (Cardiothoracic Vascular Surgery) | Admitting: Thoracic Surgery (Cardiothoracic Vascular Surgery)

## 2018-01-12 DIAGNOSIS — J441 Chronic obstructive pulmonary disease with (acute) exacerbation: Secondary | ICD-10-CM

## 2018-01-12 DIAGNOSIS — R918 Other nonspecific abnormal finding of lung field: Secondary | ICD-10-CM | POA: Diagnosis not present

## 2018-01-13 NOTE — Pre-Procedure Instructions (Signed)
Trevor Griffin [facility]  01/13/2018      [id number] [address], [place] [address] Phone: [phone] Fax: [phone]    Your procedure is scheduled on Friday, Sept. 13th   Report to [facility] Admitting at 5:30 AM             (posted surgery time 7:30a - 9:05a)   Call this number if you have problems the morning of surgery:  [phone]   Remember:   Do not eat any foods or drink any liquids after midnight, Thursday.   4-5 days prior to surgery, STOP TAKING any Vitamins, Herbal Supplements, Anti-inflammatories, Blood Thinners.    Take these medicines the morning of surgery with A SIP OF WATER : Cyclobenzaprine, Nexium, Levothyroxine, Metoprolol, Oxycodone    Do not wear jewelry - no rings or watches.  Do not wear lotions, colognes or deodorant.             Men may shave face and neck.  Do not bring valuables to the hospital.  [facility] is not responsible for any belongings or valuables.  Contacts, dentures or bridgework may not be worn into surgery.  Leave your suitcase in the car.  After surgery it may be brought to your room.  For patients admitted to the hospital, discharge time will be determined by your treatment team.  Patients discharged the day of surgery will not be allowed to drive home, and will need someone to stay with you for the next 24 hrs.  Please read over the following fact sheets that you were given. Pain Booklet, MRSA Information and Surgical Site Infection Prevention       [place]- Preparing For Surgery  Before surgery, you can play an important role. Because skin is not sterile, your skin needs to be as free of germs as possible. You can reduce the number of germs on your skin by washing with CHG (chlorahexidine gluconate) Soap before surgery.  CHG is an antiseptic cleaner which kills germs and bonds with the skin to continue killing germs even after washing.    Oral Hygiene  is also important to reduce your risk of infection.    Remember - BRUSH YOUR TEETH THE MORNING OF SURGERY WITH YOUR REGULAR TOOTHPASTE  Please do not use if you have an allergy to CHG or antibacterial soaps. If your skin becomes reddened/irritated stop using the CHG.  Do not shave (including legs and underarms) for at least 48 hours prior to first CHG shower. It is OK to shave your face.  Please follow these instructions carefully.   1. Shower the NIGHT BEFORE SURGERY and the MORNING OF SURGERY with CHG.   2. If you chose to wash your hair, wash your hair first as usual with your normal shampoo.  3. After you shampoo, rinse your hair and body thoroughly to remove the shampoo.  4. Use CHG as you would any other liquid soap. You can apply CHG directly to the skin and wash gently with a scrungie or a clean washcloth.   5. Apply the CHG Soap to your body ONLY FROM THE NECK DOWN.  Do not use on open wounds or open sores. Avoid contact with your eyes, ears, mouth and genitals (private parts). Wash Face and genitals (private parts)  with your normal soap.  6. Wash thoroughly, paying special attention to the area where your surgery will be performed.  7. Thoroughly rinse your body  with warm water from the neck down.  8. DO NOT shower/wash with your normal soap after using and rinsing off the CHG Soap.  9. Pat yourself dry with a CLEAN TOWEL.  10. Wear CLEAN PAJAMAS to bed the night before surgery, wear comfortable clothes the morning of surgery  11. Place CLEAN SHEETS on your bed the night of your first shower and DO NOT SLEEP WITH PETS.   Day of Surgery:  Do not apply any deodorants/lotions.  Please wear clean clothes to the hospital/surgery center.    Remember to brush your teeth WITH YOUR REGULAR TOOTHPASTE.

## 2018-01-14 ENCOUNTER — Encounter (HOSPITAL_COMMUNITY): Payer: Self-pay

## 2018-01-14 ENCOUNTER — Telehealth: Payer: Self-pay

## 2018-01-14 ENCOUNTER — Inpatient Hospital Stay (HOSPITAL_COMMUNITY)
Admission: RE | Admit: 2018-01-14 | Discharge: 2018-01-14 | Disposition: A | Discharge status: Home / Self Care | Payer: Medicare Other | Source: Ambulatory Visit

## 2018-01-14 ENCOUNTER — Other Ambulatory Visit: Payer: Self-pay

## 2018-01-14 ENCOUNTER — Other Ambulatory Visit: Payer: Self-pay | Admitting: Internal Medicine

## 2018-01-14 DIAGNOSIS — G893 Neoplasm related pain (acute) (chronic): Secondary | ICD-10-CM

## 2018-01-14 DIAGNOSIS — M60862 Other myositis, left lower leg: Secondary | ICD-10-CM

## 2018-01-14 DIAGNOSIS — M159 Polyosteoarthritis, unspecified: Secondary | ICD-10-CM

## 2018-01-14 DIAGNOSIS — M150 Primary generalized (osteo)arthritis: Principal | ICD-10-CM

## 2018-01-14 HISTORY — DX: Umbilical hernia without obstruction or gangrene: K42.9

## 2018-01-14 MED ORDER — OXYCODONE HCL 5 MG PO TABS: 5.0000 mg | ORAL_TABLET | Freq: Four times a day (QID) | ORAL | 0 refills | Status: DC | PRN

## 2018-01-14 NOTE — Progress Notes (Addendum)
Patient stated that he has stopped the low dose aspirin -- last dose 9/5.  Instructions given and documented. Dr. Ellyn Hack is cardio  LOV 08/2017 He's had 2 MI's, and has 6 stents. Currently denies any cp, murmur, angina. Heart cath 2012 Lexiscan 08/2011 Sleep study 10/2011 Echo 02/2014 H/O tongue cancer with removal.  Sees Dr. Alvy Bimler at [facility].  Last time he had radiation was back in 2014 PCP is Dr Ronnald Ramp  [place] 12/2017 I did call and left VM for [name], stating patient was unable to come in for PAT d/t wife's health issue.(patient assured me he would be here for surgery tomorrow)

## 2018-01-14 NOTE — Progress Notes (Signed)
Anesthesia Chart Review: SAME DAY WORKUP   Case:  [id number] Date/Time:  01/15/18 1340   Procedures:      VIDEO BRONCHOSCOPY WITH ENDOBRONCHIAL NAVIGATION (N/A )     PLACEMENT OF FUDUCIAL (N/A )   Anesthesia type:  General   Pre-op diagnosis:  LUL NODULE   Location:  [facility] OR ROOM 15 / [place] [facility]   Surgeon:  Melrose Nakayama, MD      Pt was scheduled to be seen in PAT but stated he could not come in due to wife's health issues so converted to same day workup. [name] Brooks notified.  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   Pt last saw Dr. Ellyn Hack 03/08/2017. Per his note at that time "Overall doing well without any active anginal symptoms.  Last catheterization was in 2016.  He has not had any ischemic evaluation since then.  He is no longer on Plavix, but is still on aspirin and statin.  He is on low-dose beta-blocker which I would not want to titrate up based on his fatigue level. Would simply continue current medications at this time."  He was recommended 52mocardiology f/u but it does not appear this appt was kept.  Pt reports he stopped ASA 832m9/5/19.  Pt will need DOS eval by anesthesiologist. Anticipate he can proceed as planned if no acute status change and DOS labs acceptable   VS: Ht 5' 8" (1.727 m)   Wt 73.9 kg   BMI 24.78 kg/m   PROVIDERS: JoJanith LimaMD is PCP last seen 12/22/2017  GoHeath LarkMD is Oncologist last seen 12/22/2017  HaReece LevyMD is cardiologist last seen 03/06/2017  LABS: Will need DOS labs  Labs Reviewed - No data to display   IMAGES: Chest CT 01/12/2018: IMPRESSION: 1.  Progression of disease, with enlargement of the previously noted left upper lobe lesion, and increasing osteolysis of the metastatic lesion in the anterolateral aspect of the left sixth rib. 2. Aortic atherosclerosis, in addition to 3 vessel coronary artery disease. There is also evidence of prior LAD territory myocardial infarction(s). 3. Hepatic steatosis. 4. Cholelithiasis. 5. Additional incidental findings, as above.  Aortic Atherosclerosis (ICD10-I70.0).   EKG: 03/31/2017: NSR (100bpm)  CV: Cath 06/21/2014: Coronary angiography: Coronary dominance: Left   Left Main:  Normal  Left Anterior Descending (LAD):  Normal in size with multiple overlapped stents in the proximal and midsegment with minimal restenosis. There is 30% stenosis distal to the stented area. Otherwise no obstructive disease.  1st diagonal (D1):  Medium in size with minor irregularities.  2nd diagonal (D2):  Medium in size with minor irregularities.  3rd diagonal (D3):  Very small in size with no significant disease.  Circumflex (LCx):  Large in size and dominant. There is 20% stenosis in the midsegment. A stent is noted in the posterior AV groove artery which is patent with no significant restenosis.  1st obtuse marginal:  Medium in size with minor irregularities.  2nd obtuse marginal:  Normal in size with minor irregularities.  3rd obtuse marginal:  Small in size with minor irregularities.   AV groove continuation segment: Large in size with patent stent. Normal left PDA and  posterolateral branches   Right Coronary Artery:Small in size and nondominant. the vessel has no significant disease.   Left ventriculography: Left ventricular systolic function is mildly to moderately , LVEF is estimated at 40  %, there is no  significant mitral regurgitation . Severe hypokinesis of the distal anterior wall and apical wall.  Final Conclusions:   1. No evidence of obstructive coronary artery disease. Patent  stents in the LAD and left circumflex without significant restenosis.  2. Mildly to moderately reduced LV systolic function with an ejection fraction of 40% due to post MI cardiomyopathy. Normal left ventricular end-diastolic pressure.   Recommendations:  Continue medical therapy.   TTE 02/24/2014: Study Conclusions  - Left ventricle: The cavity size was normal. Systolic function was mildly reduced. The estimated ejection fraction was in the range of 45% to 50%. There is akinesis of the mid-apicalanteroseptal myocardium. Doppler parameters are consistent with abnormal left ventricular relaxation (grade 1 diastolic dysfunction). - Aortic valve: There was mild regurgitation. - Left atrium: The atrium was mildly dilated.    Past Medical History:  Diagnosis Date  . Acute ST elevation myocardial infarction (STEMI) involving left anterior descending (LAD) coronary artery ([place]) 2008 and 2009   5/'08 - DES x 4 to p-m LAD; 2/'09 - PTCA of  in-stent thrombosis  . Alcohol abuse   . Anxiety   . CAD S/P percutaneous coronary angioplasty 2008   a. Ant STEMI 5/08 w/ 4 Taxus DES p-m LAD (3.0 x 16,16,24,28 mm) ;  b. 2/'09:  Stopped Plavix-> Ant STEMI/stent thrombosis in LAD (PTCA - 3.8 mm), LCX 55md;  c. 10/12: Cath/PCI: p-m LAD stents 10-20% ISR, mLAD 40%, CFX 30-40%, mCFX 90% (Promus Premier DES 3.0 mm x 20 mm - 3.255m), EF 35%; d. cath 2/17/'16 no Sig CAD, patent LAD & Cx stents, EF 40-45%  . Chronic pain   . COPD (chronic obstructive pulmonary disease) ([facility]  . Depression   . Fatigue    Sleep study 6/08 was negative. Sleep study 5/13 showed no OSA.  . Marland KitchenERD (gastroesophageal reflux disease)   . Headache(784.0)    migraines  . Hx of radiation therapy 10/25/12-12/10/12    Right Tongue/ Bilateral Neck //Total Dose, 60 Gy in 30 Fractions  . Hyperlipidemia   . Hypertension   . Ischemic cardiomyopathy    a. Echo 08/20/11 with EF 50-55% (previously 35%);  b. 02/2014 Echo: EF 45-50-%,  mid-apicalanteroseptal AK, Gr 1 dd, mild AI, midlly dil LA.  . Lung cancer (HCTolardx'd 2015  . Obesity   . Tobacco abuse   . Tongue cancer (HCPingreedx'd 2014   s/p resection - [facility]  . Umbilical hernia     Past Surgical History:  Procedure Laterality Date  . COLONOSCOPY N/A 11/25/2016   Procedure: COLONOSCOPY;  Surgeon: MaClarene EssexMD;  Location: [facility];  Service: Endoscopy;  Laterality: N/A;  . CORONARY ANGIOPLASTY WITH STENT PLACEMENT  2008-2012   a. 5/'08: Anterior STEMI: Proximal-mid LAD PCI: 4 total Taxus DES stents 3.0 mm x  16 mm, 16 mm, 24 mm and 28 mm.( post-dil 3.5 mm);; b. 2/'09: PTCA LAD in-stent thrombosis - 3.8 mm;; c. 10/'12: mCx Promus Element DES 3.0 x 20 mm- post-dil - 3.25 mm. -> patent in 2016  . ESOPHAGOGASTRODUODENOSCOPY (EGD) WITH PROPOFOL N/A 11/25/2016   Procedure: ESOPHAGOGASTRODUODENOSCOPY (EGD) WITH PROPOFOL;  Surgeon: MaClarene EssexMD;  Location: [facility];  Service: Endoscopy;  Laterality: N/A;  neonatal scope  . FRACTURE SURGERY Right  as a child,leg  . LEFT HEART CATH AND CORONARY ANGIOGRAPHY  2006-2012   a) 2006: minimal CAD, normal EF;; b) 5/'08 - Ant STEMI - 100% p-mLAD (DES x 4 in LAD);; c)2/'09: Ant STEMI for IST/thrombosis of LAD ->PTCA, 80% mCx;; 10/'12: mCx 80-90% - DES PCI, patent LAD stents, EF ~35% - anterolateral AK, apical severe HK.  Marland Kitchen LEFT HEART CATHETERIZATION WITH CORONARY ANGIOGRAM N/A 06/21/2014   Procedure: LEFT HEART CATHETERIZATION WITH CORONARY ANGIOGRAM;  Surgeon: Burnell Blanks, MD;  Location: [facility] CATH LAB: No evidence of obstructive disease.  Patent LAD and circumflex stents.  EF 40-45%.  . LOBECTOMY Right 06/06/2013   Procedure: LOBECTOMY;  Surgeon: Melrose Nakayama, MD;  Location: [facility];  Service: Thoracic;  Laterality: Right;  . TRANSTHORACIC ECHOCARDIOGRAM  02/2014   EF 45-50%.  Akinesis of the mid apical anteroseptal wall.  GR 1 DD.  Mild MR.  . VIDEO ASSISTED THORACOSCOPY (VATS)/WEDGE RESECTION Right 06/06/2013    Procedure: VIDEO ASSISTED THORACOSCOPY (VATS)/WEDGE RESECTION;  Surgeon: Melrose Nakayama, MD;  Location: [facility];  Service: Thoracic;  Laterality: Right;    MEDICATIONS: . aspirin 81 MG tablet  . B Complex-C (B-COMPLEX WITH VITAMIN C) tablet  . cyclobenzaprine (FLEXERIL) 5 MG tablet  . dronabinol (MARINOL) 2.5 MG capsule  . esomeprazole (NEXIUM) 20 MG capsule  . feeding supplement, ENSURE ENLIVE, (ENSURE ENLIVE) LIQD  . Glycopyrrolate (LONHALA MAGNAIR REFILL KIT) 25 MCG/ML SOLN  . levothyroxine (SYNTHROID, LEVOTHROID) 175 MCG tablet  . metoprolol tartrate (LOPRESSOR) 25 MG tablet  . nitroGLYCERIN (NITROSTAT) 0.4 MG SL tablet  . oxyCODONE (OXY IR/ROXICODONE) 5 MG immediate release tablet   No current facility-administered medications for this encounter.     [place], [facility] Short Stay Center/Anesthesiology Phone [phone] 01/14/2018 1:50 PM

## 2018-01-14 NOTE — Telephone Encounter (Signed)
Copied from [place] [id number]. Topic: General - Other >> Jan 13, 2018 12:54 PM Carolyn Stare wrote:  Refill req oxyCODONE (OXY IR/ROXICODONE) 5 MG immediate release tablet   [facility] [address]

## 2018-01-15 ENCOUNTER — Encounter (HOSPITAL_COMMUNITY)
Admission: RE | Disposition: A | Discharge status: Home / Self Care | Payer: Self-pay | Source: Ambulatory Visit | Attending: Thoracic Surgery (Cardiothoracic Vascular Surgery)

## 2018-01-15 ENCOUNTER — Ambulatory Visit (HOSPITAL_COMMUNITY): Payer: Medicare Other

## 2018-01-15 ENCOUNTER — Ambulatory Visit (HOSPITAL_COMMUNITY): Payer: Medicare Other | Admitting: Physician Assistant

## 2018-01-15 ENCOUNTER — Ambulatory Visit (HOSPITAL_COMMUNITY)
Admission: RE | Admit: 2018-01-15 | Discharge: 2018-01-15 | Disposition: A | Discharge status: Home / Self Care | Payer: Medicare Other | Source: Ambulatory Visit | Attending: Thoracic Surgery (Cardiothoracic Vascular Surgery) | Admitting: Thoracic Surgery (Cardiothoracic Vascular Surgery)

## 2018-01-15 ENCOUNTER — Ambulatory Visit (HOSPITAL_COMMUNITY): Payer: Medicare Other | Admitting: Anesthesiology

## 2018-01-15 DIAGNOSIS — Z85810 Personal history of malignant neoplasm of tongue: Secondary | ICD-10-CM | POA: Diagnosis not present

## 2018-01-15 DIAGNOSIS — Z419 Encounter for procedure for purposes other than remedying health state, unspecified: Secondary | ICD-10-CM

## 2018-01-15 DIAGNOSIS — K5730 Diverticulosis of large intestine without perforation or abscess without bleeding: Secondary | ICD-10-CM | POA: Insufficient documentation

## 2018-01-15 DIAGNOSIS — F17290 Nicotine dependence, other tobacco product, uncomplicated: Secondary | ICD-10-CM | POA: Diagnosis not present

## 2018-01-15 DIAGNOSIS — N433 Hydrocele, unspecified: Secondary | ICD-10-CM | POA: Insufficient documentation

## 2018-01-15 DIAGNOSIS — K760 Fatty (change of) liver, not elsewhere classified: Secondary | ICD-10-CM | POA: Diagnosis not present

## 2018-01-15 DIAGNOSIS — I2510 Atherosclerotic heart disease of native coronary artery without angina pectoris: Secondary | ICD-10-CM | POA: Diagnosis not present

## 2018-01-15 DIAGNOSIS — Z7982 Long term (current) use of aspirin: Secondary | ICD-10-CM | POA: Diagnosis not present

## 2018-01-15 DIAGNOSIS — F329 Major depressive disorder, single episode, unspecified: Secondary | ICD-10-CM | POA: Insufficient documentation

## 2018-01-15 DIAGNOSIS — F419 Anxiety disorder, unspecified: Secondary | ICD-10-CM | POA: Diagnosis not present

## 2018-01-15 DIAGNOSIS — J449 Chronic obstructive pulmonary disease, unspecified: Secondary | ICD-10-CM | POA: Insufficient documentation

## 2018-01-15 DIAGNOSIS — K219 Gastro-esophageal reflux disease without esophagitis: Secondary | ICD-10-CM | POA: Diagnosis not present

## 2018-01-15 DIAGNOSIS — N281 Cyst of kidney, acquired: Secondary | ICD-10-CM | POA: Diagnosis not present

## 2018-01-15 DIAGNOSIS — I252 Old myocardial infarction: Secondary | ICD-10-CM | POA: Insufficient documentation

## 2018-01-15 DIAGNOSIS — I10 Essential (primary) hypertension: Secondary | ICD-10-CM | POA: Diagnosis not present

## 2018-01-15 DIAGNOSIS — Z955 Presence of coronary angioplasty implant and graft: Secondary | ICD-10-CM | POA: Diagnosis not present

## 2018-01-15 DIAGNOSIS — E039 Hypothyroidism, unspecified: Secondary | ICD-10-CM | POA: Diagnosis not present

## 2018-01-15 DIAGNOSIS — Z923 Personal history of irradiation: Secondary | ICD-10-CM | POA: Diagnosis not present

## 2018-01-15 DIAGNOSIS — I700 Atherosclerosis of aorta: Secondary | ICD-10-CM | POA: Insufficient documentation

## 2018-01-15 DIAGNOSIS — C3412 Malignant neoplasm of upper lobe, left bronchus or lung: Secondary | ICD-10-CM | POA: Insufficient documentation

## 2018-01-15 DIAGNOSIS — Z79899 Other long term (current) drug therapy: Secondary | ICD-10-CM | POA: Diagnosis not present

## 2018-01-15 DIAGNOSIS — R911 Solitary pulmonary nodule: Secondary | ICD-10-CM

## 2018-01-15 DIAGNOSIS — E785 Hyperlipidemia, unspecified: Secondary | ICD-10-CM | POA: Insufficient documentation

## 2018-01-15 DIAGNOSIS — C7A8 Other malignant neuroendocrine tumors: Secondary | ICD-10-CM | POA: Diagnosis not present

## 2018-01-15 HISTORY — PX: VIDEO BRONCHOSCOPY WITH ENDOBRONCHIAL NAVIGATION: SHX6175

## 2018-01-15 HISTORY — PX: FUDUCIAL PLACEMENT: SHX5083

## 2018-01-15 LAB — COMPREHENSIVE METABOLIC PANEL
ALT: 24 U/L (ref 0–44)
AST: 32 U/L (ref 15–41)
Albumin: 3.6 g/dL (ref 3.5–5.0)
Alkaline Phosphatase: 97 U/L (ref 38–126)
Anion gap: 13 (ref 5–15)
BILIRUBIN TOTAL: 1 mg/dL (ref 0.3–1.2)
BUN: 6 mg/dL — ABNORMAL LOW (ref 8–23)
CHLORIDE: 97 mmol/L — AB (ref 98–111)
CO2: 23 mmol/L (ref 22–32)
CREATININE: 0.74 mg/dL (ref 0.61–1.24)
Calcium: 9 mg/dL (ref 8.9–10.3)
Glucose, Bld: 82 mg/dL (ref 70–99)
POTASSIUM: 4 mmol/L (ref 3.5–5.1)
Sodium: 133 mmol/L — ABNORMAL LOW (ref 135–145)
TOTAL PROTEIN: 6.8 g/dL (ref 6.5–8.1)

## 2018-01-15 LAB — CBC
HCT: 44.4 % (ref 39.0–52.0)
Hemoglobin: 14.5 g/dL (ref 13.0–17.0)
MCH: 32.5 pg (ref 26.0–34.0)
MCHC: 32.7 g/dL (ref 30.0–36.0)
MCV: 99.6 fL (ref 78.0–100.0)
PLATELETS: 163 10*3/uL (ref 150–400)
RBC: 4.46 MIL/uL (ref 4.22–5.81)
RDW: 14.1 % (ref 11.5–15.5)
WBC: 6 10*3/uL (ref 4.0–10.5)

## 2018-01-15 LAB — APTT: aPTT: 30 seconds (ref 24–36)

## 2018-01-15 LAB — PROTIME-INR
INR: 1.05
PROTHROMBIN TIME: 13.7 s (ref 11.4–15.2)

## 2018-01-15 SURGERY — VIDEO BRONCHOSCOPY WITH ENDOBRONCHIAL NAVIGATION
Anesthesia: General | Site: Bronchus

## 2018-01-15 MED ORDER — ROCURONIUM BROMIDE 10 MG/ML (PF) SYRINGE
PREFILLED_SYRINGE | INTRAVENOUS | Status: DC | PRN
  Administered 2018-01-15: 50 mg via INTRAVENOUS

## 2018-01-15 MED ORDER — FENTANYL CITRATE (PF) 100 MCG/2ML IJ SOLN: 25.0000 ug | INTRAMUSCULAR | Status: DC | PRN

## 2018-01-15 MED ORDER — DEXAMETHASONE SODIUM PHOSPHATE 10 MG/ML IJ SOLN
INTRAMUSCULAR | Status: DC | PRN
  Administered 2018-01-15: 5 mg via INTRAVENOUS

## 2018-01-15 MED ORDER — SUGAMMADEX SODIUM 200 MG/2ML IV SOLN
INTRAVENOUS | Status: DC | PRN
  Administered 2018-01-15: 200 mg via INTRAVENOUS

## 2018-01-15 MED ORDER — MIDAZOLAM HCL 2 MG/2ML IJ SOLN
INTRAMUSCULAR | Status: AC
  Filled 2018-01-15: qty 2

## 2018-01-15 MED ORDER — FENTANYL CITRATE (PF) 250 MCG/5ML IJ SOLN
INTRAMUSCULAR | Status: AC
  Filled 2018-01-15: qty 5

## 2018-01-15 MED ORDER — MIDAZOLAM HCL 2 MG/2ML IJ SOLN
INTRAMUSCULAR | Status: DC | PRN
  Administered 2018-01-15: 2 mg via INTRAVENOUS

## 2018-01-15 MED ORDER — SODIUM CHLORIDE 0.9 % IV SOLN
INTRAVENOUS | Status: DC | PRN
  Administered 2018-01-15: 25 ug/min via INTRAVENOUS

## 2018-01-15 MED ORDER — OXYCODONE HCL 5 MG PO TABS
5.0000 mg | ORAL_TABLET | Freq: Once | ORAL | Status: AC | PRN
  Administered 2018-01-15: 5 mg via ORAL

## 2018-01-15 MED ORDER — PHENYLEPHRINE HCL 10 MG/ML IJ SOLN
INTRAMUSCULAR | Status: DC | PRN
  Administered 2018-01-15: 80 ug via INTRAVENOUS

## 2018-01-15 MED ORDER — OXYCODONE HCL 5 MG/5ML PO SOLN: 5.0000 mg | Freq: Once | ORAL | Status: AC | PRN

## 2018-01-15 MED ORDER — ROCURONIUM BROMIDE 50 MG/5ML IV SOSY
PREFILLED_SYRINGE | INTRAVENOUS | Status: AC
  Filled 2018-01-15: qty 10

## 2018-01-15 MED ORDER — LIDOCAINE 2% (20 MG/ML) 5 ML SYRINGE
INTRAMUSCULAR | Status: AC
  Filled 2018-01-15: qty 5

## 2018-01-15 MED ORDER — DEXAMETHASONE SODIUM PHOSPHATE 10 MG/ML IJ SOLN
INTRAMUSCULAR | Status: AC
  Filled 2018-01-15: qty 1

## 2018-01-15 MED ORDER — METOPROLOL TARTRATE 12.5 MG HALF TABLET
ORAL_TABLET | ORAL | Status: AC
  Filled 2018-01-15: qty 1

## 2018-01-15 MED ORDER — METOPROLOL TARTRATE 12.5 MG HALF TABLET
12.5000 mg | ORAL_TABLET | Freq: Once | ORAL | Status: DC
  Filled 2018-01-15: qty 1

## 2018-01-15 MED ORDER — ARTIFICIAL TEARS OPHTHALMIC OINT
TOPICAL_OINTMENT | OPHTHALMIC | Status: AC
  Filled 2018-01-15: qty 7

## 2018-01-15 MED ORDER — ONDANSETRON HCL 4 MG/2ML IJ SOLN
INTRAMUSCULAR | Status: DC | PRN
  Administered 2018-01-15: 4 mg via INTRAVENOUS

## 2018-01-15 MED ORDER — LIDOCAINE 2% (20 MG/ML) 5 ML SYRINGE
INTRAMUSCULAR | Status: DC | PRN
  Administered 2018-01-15: 80 mg via INTRAVENOUS

## 2018-01-15 MED ORDER — ONDANSETRON HCL 4 MG/2ML IJ SOLN
INTRAMUSCULAR | Status: AC
  Filled 2018-01-15: qty 2

## 2018-01-15 MED ORDER — PHENYLEPHRINE 40 MCG/ML (10ML) SYRINGE FOR IV PUSH (FOR BLOOD PRESSURE SUPPORT)
PREFILLED_SYRINGE | INTRAVENOUS | Status: AC
  Filled 2018-01-15: qty 10

## 2018-01-15 MED ORDER — FENTANYL CITRATE (PF) 250 MCG/5ML IJ SOLN
INTRAMUSCULAR | Status: DC | PRN
  Administered 2018-01-15: 50 ug via INTRAVENOUS
  Administered 2018-01-15: 100 ug via INTRAVENOUS

## 2018-01-15 MED ORDER — ONDANSETRON HCL 4 MG/2ML IJ SOLN: 4.0000 mg | Freq: Once | INTRAMUSCULAR | Status: DC | PRN

## 2018-01-15 MED ORDER — EPINEPHRINE PF 1 MG/ML IJ SOLN
INTRAMUSCULAR | Status: AC
  Filled 2018-01-15: qty 1

## 2018-01-15 MED ORDER — PROPOFOL 10 MG/ML IV BOLUS
INTRAVENOUS | Status: AC
  Filled 2018-01-15: qty 20

## 2018-01-15 MED ORDER — LACTATED RINGERS IV SOLN
INTRAVENOUS | Status: DC
  Administered 2018-01-15: 10 mL/h via INTRAVENOUS
  Administered 2018-01-15: 15:00:00 via INTRAVENOUS

## 2018-01-15 MED ORDER — OXYCODONE HCL 5 MG PO TABS
ORAL_TABLET | ORAL | Status: AC
  Filled 2018-01-15: qty 1

## 2018-01-15 MED ORDER — PROPOFOL 10 MG/ML IV BOLUS
INTRAVENOUS | Status: DC | PRN
  Administered 2018-01-15: 120 mg via INTRAVENOUS

## 2018-01-15 SURGICAL SUPPLY — 35 items
ADAPTER BRONCH F/PENTAX (ADAPTER) ×3 IMPLANT
ADPR BSCP EDG PNTX (ADAPTER) ×1
BRUSH SUPERTRAX NDL-TIP CYTO (INSTRUMENTS) ×3 IMPLANT
CANISTER SUCT 3000ML PPV (MISCELLANEOUS) ×3 IMPLANT
COVER BACK TABLE 60X90IN (DRAPES) ×3 IMPLANT
FILTER STRAW FLUID ASPIR (MISCELLANEOUS) ×2 IMPLANT
FORCEPS BIOP SUPERTRX PREMAR (INSTRUMENTS) ×2 IMPLANT
GAUZE SPONGE 4X4 12PLY STRL (GAUZE/BANDAGES/DRESSINGS) ×1 IMPLANT
GLOVE SURG SIGNA 7.5 PF LTX (GLOVE) ×3 IMPLANT
GOWN STRL REUS W/ TWL XL LVL3 (GOWN DISPOSABLE) ×1 IMPLANT
GOWN STRL REUS W/TWL XL LVL3 (GOWN DISPOSABLE) ×6
KIT CLEAN ENDO COMPLIANCE (KITS) ×3 IMPLANT
KIT MARKER FIDUCIAL DELIVERY (KITS) ×2 IMPLANT
KIT PROCEDURE EDGE 180 (KITS) ×2 IMPLANT
KIT TURNOVER KIT B (KITS) ×3 IMPLANT
MARKER FIDUCIAL SL NIT COIL (Implant Marker) ×6 IMPLANT
NDL SUPERTRX PREMARK BIOPSY (NEEDLE) IMPLANT
NEEDLE SUPERTRX PREMARK BIOPSY (NEEDLE) ×3 IMPLANT
NS IRRIG 1000ML POUR BTL (IV SOLUTION) ×3 IMPLANT
OIL SILICONE PENTAX (PARTS (SERVICE/REPAIRS)) ×3 IMPLANT
PAD ARMBOARD 7.5X6 YLW CONV (MISCELLANEOUS) ×6 IMPLANT
PATCHES PATIENT (LABEL) ×9 IMPLANT
SYR 20CC LL (SYRINGE) ×5 IMPLANT
SYR 20ML ECCENTRIC (SYRINGE) ×3 IMPLANT
SYR 30ML LL (SYRINGE) ×3 IMPLANT
SYR 5ML LL (SYRINGE) ×3 IMPLANT
TOWEL GREEN STERILE (TOWEL DISPOSABLE) ×3 IMPLANT
TOWEL GREEN STERILE FF (TOWEL DISPOSABLE) ×3 IMPLANT
TRAP SPECIMEN MUCOUS 40CC (MISCELLANEOUS) ×3 IMPLANT
TUBE CONNECTING 20'X1/4 (TUBING) ×2
TUBE CONNECTING 20X1/4 (TUBING) ×4 IMPLANT
UNDERPAD 30X30 (UNDERPADS AND DIAPERS) ×3 IMPLANT
VALVE BIOPSY  SINGLE USE (MISCELLANEOUS) ×2
VALVE BIOPSY SINGLE USE (MISCELLANEOUS) IMPLANT
WATER STERILE IRR 1000ML POUR (IV SOLUTION) ×3 IMPLANT

## 2018-01-15 NOTE — Transfer of Care (Signed)
Immediate Anesthesia Transfer of Care Note  Patient: Trevor Griffin [facility]  Procedure(s) Performed: VIDEO BRONCHOSCOPY WITH ENDOBRONCHIAL NAVIGATION (N/A Bron[place]) PLACEMENT OF FUDUCIAL (N/A Bronchus)  Patient Location: PACU  Anesthesia Type:General  Level of Consciousness: awake, alert  and oriented  Airway & Oxygen Therapy: Patient Spontanous Breathing and Patient connected to nasal cannula oxygen  Post-op Assessment: Report given to RN and Post -op Vital signs reviewed and stable  Post vital signs: Reviewed and stable  Last Vitals:  Vitals Value Taken Time  BP 139/79 01/15/2018  3:23 PM  Temp    Pulse 84 01/15/2018  3:23 PM  Resp 13 01/15/2018  3:23 PM  SpO2 100 % 01/15/2018  3:23 PM  Vitals shown include unvalidated device data.  Last Pain:  Vitals:   01/15/18 1127  TempSrc: Oral         Complications: No apparent anesthesia complications

## 2018-01-15 NOTE — Interval H&P Note (Signed)
History and Physical Interval Note:  01/15/2018 1:29 PM  Trevor Griffin  has presented today for surgery, with the diagnosis of LUL NODULE  The various methods of treatment have been discussed with the patient and family. After consideration of risks, benefits and other options for treatment, the patient has consented to  Procedure(s): [place] (N/A) PLACEMENT OF FUDUCIAL (N/A) as a surgical intervention .  The patient's history has been reviewed, patient examined, no change in status, stable for surgery.  I have reviewed the patient's chart and labs.  Questions were answered to the patient's satisfaction.     Melrose Nakayama

## 2018-01-15 NOTE — Brief Op Note (Signed)
01/15/2018  3:31 PM  PATIENT:  Trevor Griffin  62 y.o. male  PRE-OPERATIVE DIAGNOSIS:  LUL NODULE- SUSPECTED NON-SMALL CELL CARCINOMA- (T1,N0) CLINICAL STAGE IA  POST-OPERATIVE DIAGNOSIS:  NON-SMALL CELL CARCINOMA- (T1,N0) CLINICAL STAGE IA  PROCEDURE:  Procedure(s): VIDEO BRONCHOSCOPY WITH ENDOBRONCHIAL NAVIGATION (N/A) PLACEMENT OF FUDUCIAL (N/A) Brushings and transbronchial biopsies  SURGEON:  Surgeon(s) and Role:    * Melrose Nakayama, MD - Primary  PHYSICIAN ASSISTANT:   ASSISTANTS: none   ANESTHESIA:   general  EBL:  0 mL   BLOOD ADMINISTERED:none  DRAINS: none   LOCAL MEDICATIONS USED:  NONE  SPECIMEN:  Source of Specimen:  LUL nodule  DISPOSITION OF SPECIMEN:  PATHOLOGY  COUNTS:  NO Endo  TOURNIQUET:  * No tourniquets in log *  DICTATION: .Other Dictation: Dictation Number -  PLAN OF CARE: Discharge to home after PACU  PATIENT DISPOSITION:  PACU - hemodynamically stable.   Delay start of Pharmacological VTE agent (>24hrs) due to surgical blood loss or risk of bleeding: not applicable

## 2018-01-15 NOTE — Anesthesia Procedure Notes (Addendum)
Procedure Name: Intubation Date/Time: 01/15/2018 2:04 PM Performed by: Wilkes, [place] R, RN Pre-anesthesia Checklist: Patient identified, Emergency Drugs available, Suction available, Patient being monitored and Timeout performed Patient Re-evaluated:Patient Re-evaluated prior to induction Oxygen Delivery Method: Circle system utilized Preoxygenation: Pre-oxygenation with 100% oxygen Induction Type: IV induction Ventilation: Mask ventilation without difficulty Laryngoscope Size: Mac and 3 Grade View: Grade II Tube type: Oral Laser Tube: Cuffed inflated with minimal occlusive pressure - saline Tube size: 8.0 mm Number of attempts: 1 Airway Equipment and Method: Stylet Placement Confirmation: ETT inserted through vocal cords under direct vision,  positive ETCO2,  CO2 detector and breath sounds checked- equal and bilateral Secured at: 23 cm Tube secured with: Tape Dental Injury: Teeth and Oropharynx as per pre-operative assessment

## 2018-01-15 NOTE — Anesthesia Preprocedure Evaluation (Addendum)
Anesthesia Evaluation  Patient identified by MRN, date of birth, ID band Patient awake    Reviewed: Allergy & Precautions, NPO status , Patient's Chart, lab work & pertinent test results  History of Anesthesia Complications Negative for: history of anesthetic complications  Airway Mallampati: III  TM Distance: >3 FB Neck ROM: Full   Comment:  Grade 2 view with Mac 3 in 2015 with successful placement of DLT for VATS Dental  (+) Edentulous Upper, Edentulous Lower   Pulmonary COPD, Current Smoker,   Lung cancer s/p VATS wedge resection Previous tracheostomy, decanulated in 2014    breath sounds clear to auscultation       Cardiovascular hypertension, Pt. on home beta blockers and Pt. on medications (-) angina+ CAD, + Past MI and + Cardiac Stents   Rhythm:Regular Rate:Normal   '15 TTE - EFof 45% to 50%. There is akinesis of the mid-apicalanteroseptalmyocardium. Grade 1 diastolic dysfunction. Mild AI, mildly dilated LA.    Neuro/Psych  Headaches, PSYCHIATRIC DISORDERS Anxiety Depression    GI/Hepatic GERD  Medicated,(+)     substance abuse  alcohol use,   Endo/Other  Hypothyroidism   Renal/GU negative Renal ROS  negative genitourinary   Musculoskeletal  (+) Arthritis ,   Abdominal   Peds  Hematology negative hematology ROS (+) anemia ,   Anesthesia Other Findings Hx radiation therapy for tongue cancer  Reproductive/Obstetrics                          Anesthesia Physical Anesthesia Plan  ASA: III  Anesthesia Plan: General   Post-op Pain Management:    Induction: Intravenous  PONV Risk Score and Plan: 2 and Treatment may vary due to age or medical condition, Ondansetron, Dexamethasone and Midazolam  Airway Management Planned: Oral ETT  Additional Equipment: None  Intra-op Plan:   Post-operative Plan: Extubation in OR  Informed Consent: I have reviewed the patients History  and Physical, chart, labs and discussed the procedure including the risks, benefits and alternatives for the proposed anesthesia with the patient or authorized representative who has indicated his/her understanding and acceptance.     Plan Discussed with: CRNA and Anesthesiologist  Anesthesia Plan Comments:       Anesthesia Quick Evaluation

## 2018-01-15 NOTE — Discharge Instructions (Signed)
Do not drive or engage in heavy physical activity for 24 hours  You may resume normal activities tomorrow  You may use acetaminophen (Tylenol) if needed for discomfort.   You may cough up small amounts of blood over the next few days  Call [phone] if you develop chest pain, shortness of breath, fever > 101 F or cough up more than 2 tablespoons of blood  My office will arrange an appointment for you to meet with a  Radiation Oncologist next week  QUIT SMOKING

## 2018-01-16 NOTE — Op Note (Signed)
NAME: Trevor Griffin, Trevor Griffin. MEDICAL RECORD UK:[id number] ACCOUNT [id number] DATE OF BIRTH:12-02-1955 FACILITY: [facility] LOCATION: [facility] PHYSICIAN:Jamerius Boeckman C. Elsbeth Yearick, MD  OPERATIVE REPORT  DATE OF PROCEDURE:  01/15/2018  PREOPERATIVE DIAGNOSIS:  Left upper lobe nodule.  POSTOPERATIVE DIAGNOSIS:  Nonsmall-cell carcinoma, clinical stage IA (T1, N0) of left upper lobe.  PROCEDURE:  Electromagnetic navigational bronchoscopy with brushings and transbronchial biopsies and fiducial placement.  SURGEON:  Modesto Charon, MD  ASSISTANT:  None.  ANESTHESIA:  General.  FINDINGS:  Brushings showed nonsmall-cell carcinoma.  CLINICAL NOTE:  The patient is a 62 year old gentleman with a history of lung cancer with a right lower lobectomy previously.  He also has a history of cancer of the tongue.  He continues to smoke cigarettes.  He recently was found to have a cavitary  left upper lobe nodule.  This is highly suspicious for a new primary bronchogenic carcinoma.  He is reluctant to consider surgical resection but would consider radiation to the nodule.  He was advised to undergo navigational bronchoscopy for diagnostic  purposes as well as placement of fiducials to assist with stereotactic radiation.  The indications, risks, benefits, and alternatives were discussed in detail with the patient.  He understood and accepted the risks and agreed to proceed.  OPERATIVE NOTE:  The patient was brought to the operating room on 01/15/2018.  He had induction of general anesthesia.  A timeout was performed.  Flexible fiberoptic bronchoscopy was performed via the endotracheal tube.  It revealed no endobronchial  lesions to the level of the subsegmental bronchi.  His previous right lower lobe stump was well healed.  Locatable guide for navigation was placed.  Mapping of the nodule had been done preoperatively.  Registration was performed.  There was good correlation of the video and virtual  bronchoscopies.  The bronchoscope then was advanced to the lingular segmental bronchus, and the appropriate subsegmental bronchus was cannulated.  The locatable guide was navigated to within a centimeter and half of the left upper lobe cavitary nodule.  Local  registration was performed.  Initially, the catheter was tangential to the lesion but was within a centimeter or so.  Needle brushings and triple brushings were performed.  These were sent for quick prep.  The sampling was all done under fluoroscopy.   The total fluoroscopy time for the entire procedure was 5.2 minutes and 52 mGy were used.  The locatable guide was reinserted after every third sampling.  Biopsies were taken from this area.  The locatable guide then was advanced, and the catheter was  manipulated and had a better orientation to the center of the lesion.  The sampling process was repeated.  The quick preps on the brushings showed nonsmall-cell carcinoma.  The biopsies were all sent for permanent pathology.  Mapping then was performed  for fiducial placements, and the computer generated 3 sites for fiducial placement.  The locatable guide was advanced to each of these in sequence and fiducials were placed.  A final inspection was made with fluoroscopy, and there was no evidence of  pneumothorax.  The patient was extubated in the operating room and was taken to the [facility] in good condition.  LN/NUANCE  D:01/15/2018 T:01/16/2018 JOB:[id number]

## 2018-01-18 ENCOUNTER — Encounter (HOSPITAL_COMMUNITY): Payer: Self-pay | Admitting: Thoracic Surgery (Cardiothoracic Vascular Surgery)

## 2018-01-18 NOTE — Anesthesia Postprocedure Evaluation (Signed)
Anesthesia Post Note  Patient: Kesler Wickham [facility]  Procedure(s) Performed: VIDEO BRONCHOSCOPY WITH ENDOBRONCHIAL NAVIGATION (N/A Bronchus) PLACEMENT OF FUDUCIAL (N/A Bronchus)     Patient location during evaluation: PACU Anesthesia Type: General Level of consciousness: awake and alert Pain management: pain level controlled Vital Signs Assessment: post-procedure vital signs reviewed and stable Respiratory status: spontaneous breathing, nonlabored ventilation, respiratory function stable and patient connected to nasal cannula oxygen Cardiovascular status: blood pressure returned to baseline and stable Postop Assessment: no apparent nausea or vomiting Anesthetic complications: no    Last Vitals:  Vitals:   01/15/18 1523 01/15/18 1537  BP: 139/79 126/77  Pulse: 83 81  Resp: 19 13  Temp: (!) 36.3 C   SpO2: 99% 93%    Last Pain:  Vitals:   01/15/18 1127  TempSrc: Oral                 Quint Chestnut S

## 2018-01-19 ENCOUNTER — Telehealth: Payer: Self-pay | Admitting: Hematology and Oncology

## 2018-01-19 ENCOUNTER — Telehealth: Payer: Self-pay

## 2018-01-19 ENCOUNTER — Encounter: Payer: Self-pay | Admitting: Radiation Oncology

## 2018-01-19 NOTE — Telephone Encounter (Signed)
Spoke with Varney Biles at [facility] pathology dept.   PDL - L1 requested.

## 2018-01-19 NOTE — Telephone Encounter (Signed)
I have reviewed pathology report which shows squamous cell carcinoma. I will get additional test PD-L1 added to the tumor The patient is a candidate for radiation treatment and I will try to get him referred back to see radiation oncologist for consideration of SBRT

## 2018-01-27 ENCOUNTER — Ambulatory Visit: Payer: Medicare Other

## 2018-01-27 ENCOUNTER — Ambulatory Visit: Payer: Medicare Other | Admitting: Radiation Oncology

## 2018-02-02 NOTE — Progress Notes (Signed)
Thoracic Location of Tumor / Histology:  01/15/18 Diagnosis Lung, biopsy, Left upper - SQUAMOUS CELL CARCINOMA.  Patient presented with symptoms of: He had a follow up CT scan which demonstrated a left lung nodule.    Biopsies of left upper lung revealed: Squamous Cell Carcinoma  Tobacco/Marijuana/Snuff/ETOH use: He is currently smoking about 1/2 pack daily. He is interested is quitting. He has recently had about 12 beers weekly, but tells me today that he has only had 3 beers in the last week.   Past/Anticipated interventions by cardiothoracic surgery, if any:  01/15/18 PROCEDURE:  Electromagnetic navigational bronchoscopy with brushings and transbronchial biopsies and fiducial placement. SURGEON:  Modesto Charon, MD  Past/Anticipated interventions by medical oncology, if any:  Dr. Alvy Bimler telephone note 01/19/18: I have reviewed pathology report which shows squamous cell carcinoma. I will get additional test PD-L1 added to the tumor The patient is a candidate for radiation treatment and I will try to get him referred back to see radiation oncologist for consideration of SBRT  Signs/Symptoms  Weight changes, if any: He has lost about 30 lbs since last November. His wife is very concerned about this weight loss.   Respiratory complaints, if any: He does cough at night.   Hemoptysis, if any: He denies  Pain issues, if any:  He reports pain to his biopsy site, by his ribs.   SAFETY ISSUES:  Prior radiation? Yes,  10/25/2012 - 12/10/2012 Radiation Therapy     IMRT Isidore Moos). Right Tongue/ Bilateral Neck /// Total Dose, 60 Gy in 30 Fractions     Pacemaker/ICD? No  Possible current pregnancy? N/A  Is the patient on methotrexate? No  Current Complaints / other details:    BP (!) 155/81   Pulse 78   Temp (!) 97.5 F (36.4 C) (Oral)   Wt 162 lb 6.4 oz (73.7 kg)   SpO2 100%   BMI 24.69 kg/m    Wt Readings from Last 3 Encounters:  02/09/18 162 lb 6.4 oz (73.7 kg)   01/15/18 162 lb (73.5 kg)  01/14/18 163 lb (73.9 kg)

## 2018-02-09 ENCOUNTER — Other Ambulatory Visit: Payer: Self-pay

## 2018-02-09 ENCOUNTER — Telehealth: Payer: Self-pay | Admitting: Hematology and Oncology

## 2018-02-09 ENCOUNTER — Ambulatory Visit
Admission: RE | Admit: 2018-02-09 | Discharge: 2018-02-09 | Disposition: A | Discharge status: Home / Self Care | Payer: Medicare Other | Source: Ambulatory Visit | Attending: Radiation Oncology | Admitting: Radiation Oncology

## 2018-02-09 ENCOUNTER — Encounter: Payer: Self-pay | Admitting: Radiation Oncology

## 2018-02-09 VITALS — BP 155/81 | HR 78 | Temp 97.5°F | Wt 162.4 lb

## 2018-02-09 DIAGNOSIS — Z923 Personal history of irradiation: Secondary | ICD-10-CM | POA: Insufficient documentation

## 2018-02-09 DIAGNOSIS — Z79899 Other long term (current) drug therapy: Secondary | ICD-10-CM | POA: Diagnosis not present

## 2018-02-09 DIAGNOSIS — Z885 Allergy status to narcotic agent status: Secondary | ICD-10-CM | POA: Insufficient documentation

## 2018-02-09 DIAGNOSIS — Z7982 Long term (current) use of aspirin: Secondary | ICD-10-CM | POA: Insufficient documentation

## 2018-02-09 DIAGNOSIS — Z79890 Hormone replacement therapy (postmenopausal): Secondary | ICD-10-CM | POA: Diagnosis not present

## 2018-02-09 DIAGNOSIS — C3412 Malignant neoplasm of upper lobe, left bronchus or lung: Secondary | ICD-10-CM | POA: Insufficient documentation

## 2018-02-09 DIAGNOSIS — M898X8 Other specified disorders of bone, other site: Secondary | ICD-10-CM | POA: Diagnosis not present

## 2018-02-09 DIAGNOSIS — Z510 Encounter for antineoplastic radiation therapy: Secondary | ICD-10-CM | POA: Diagnosis not present

## 2018-02-09 DIAGNOSIS — F17290 Nicotine dependence, other tobacco product, uncomplicated: Secondary | ICD-10-CM | POA: Diagnosis not present

## 2018-02-09 MED ORDER — NICOTINE 7 MG/24HR TD PT24: MEDICATED_PATCH | TRANSDERMAL | 0 refills | Status: DC

## 2018-02-09 MED ORDER — NICOTINE 14 MG/24HR TD PT24: MEDICATED_PATCH | TRANSDERMAL | 2 refills | Status: DC

## 2018-02-09 NOTE — Telephone Encounter (Signed)
Called regarding 10/17

## 2018-02-09 NOTE — Progress Notes (Signed)
Radiation Oncology         [phone] ________________________________  Outpatient Re-Consultation  Name: Trevor Griffin MRN: [id number]  Date: 02/09/2018  DOB: Dec 29, 1955  [id number], Arvid Right, MD  Melrose Nakayama, *   REFERRING PHYSICIAN: Melrose Nakayama, *  DIAGNOSIS:    ICD-10-CM   1. Malignant neoplasm of upper lobe of left lung ([place]) C34.12 Ambulatory referral to Social Work    nicotine (NICODERM CQ - DOSED IN MG/24 HOURS) 14 mg/24hr patch    nicotine (NICODERM CQ - DOSED IN MG/24 HR) 7 mg/24hr patch    Amb Referral to Nutrition and Diabetic E    Ambulatory referral to Social Work   Cancer Staging 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[place]) Staging form: Lip and Oral Cavity, AJCC 7th Edition - Pathologic stage from 09/16/2012: Stage III (T3, N1, cM0) - Unsigned - Clinical: No stage assigned - Unsigned  Malignant neoplasm of upper lobe of left lung ([place]) Staging form: Lung, AJCC 8th Edition - Clinical: Stage IA2 (cT1b, cN0, cM0) - Signed by [name], MD on 02/09/2018   CHIEF COMPLAINT: Here to discuss management of left lung cancer  HISTORY OF PRESENT ILLNESS::Trevor Griffin is a 62 y.o. 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 He has been followed by Dr. Alvy Bimler. His most recent CT Neck dated 11/24/2017 showed stable and satisfactory post-treatment appearance of the neck. However, his chest CT on 11/24/2017 showed a 1.3 x 1.3 cm thick-walled cavitary nodule in the lingula. PET scan from 12/17/2017 showed the cavitary left upper lobe nodule measuring 1.6 cm with a maximum SUV of 6.9. There was also a faint focus of accentuated activity in the left anterolateral sixth rib, slightly asymmetric.   Repeat chest CT on 01/12/2018 showed progression of disease, with enlargement  of the previously noted left upper lobe lesion (now 1.9 x 1.8 cm), and increasing osteolysis of the metastatic lesion in the anterolateral aspect of the left sixth rib. Biopsy of the lesion on 01/15/2018 revealed squamous cell carcinoma. PD-1 testing ordered by med onc.  Dr. Roxan Hockey discussed surgery as an option for treatment but acknowledged that radiotherapy may be better tolerated given his comorbidities. The patient is far more interested in radiotherapy and has been referred today for discussion of potential radiation treatment options. He is accompanied by his wife.   On review of systems, the patient reports a 30-pound unintentional weight loss in the past 11 months. He reports difficulty breathing at night when he lies on his left side. He reports a cough productive with clear/yellow sputum. He reports pain in his left ribs since the lung biopsy. He reports occasional diarrhea.   Decreased appetite reported. Muscle wasting in legs.  He is ambulatory.   Tobacco/Alcohol Use: He reports he is currently smoking about 1/2 pack daily and is interested in quitting but has not tried anything to help him quit. He has recently had about 12 beers weekly but states that he has only had 3 beers in the past week.  PREVIOUS RADIATION THERAPY: Yes  Radiation treatment dates:  10/25/2012-12/10/2012 Site/dose:   Right Tongue/ Bilateral Neck /// Total Dose 60 Gy in 30 Fractions  PAST MEDICAL HISTORY:  has a past medical history of Acute ST elevation myocardial infarction (STEMI) involving left anterior descending (LAD) coronary artery ([place]) (2008 and  2009), Alcohol abuse, Anxiety, CAD S/P percutaneous coronary angioplasty (2008), Chronic pain, COPD (chronic obstructive pulmonary disease) ([place]), Depression, Fatigue, GERD (gastroesophageal reflux disease), Headache(784.0), radiation therapy (10/25/12-12/10/12), Hyperlipidemia, Hypertension, Ischemic cardiomyopathy, Lung cancer ([place]) (dx'd 2015), Obesity, Tobacco abuse,  Tongue cancer ([place]) (dx'd 0814), and Umbilical hernia.    PAST SURGICAL HISTORY: Past Surgical History:  Procedure Laterality Date  . COLONOSCOPY N/A 11/25/2016   Procedure: COLONOSCOPY;  Surgeon: Clarene Essex, MD;  Location: [facility];  Service: Endoscopy;  Laterality: N/A;  . CORONARY ANGIOPLASTY WITH STENT PLACEMENT  2008-2012   a. 5/'08: Anterior STEMI: Proximal-mid LAD PCI: 4 total Taxus DES stents 3.0 mm x  16 mm, 16 mm, 24 mm and 28 mm.( post-dil 3.5 mm);; b. 2/'09: PTCA LAD in-stent thrombosis - 3.8 mm;; c. 10/'12: mCx Promus Element DES 3.0 x 20 mm- post-dil - 3.25 mm. -> patent in 2016  . ESOPHAGOGASTRODUODENOSCOPY (EGD) WITH PROPOFOL N/A 11/25/2016   Procedure: ESOPHAGOGASTRODUODENOSCOPY (EGD) WITH PROPOFOL;  Surgeon: Clarene Essex, MD;  Location: [facility];  Service: Endoscopy;  Laterality: N/A;  neonatal scope  . FRACTURE SURGERY Right    as a child,leg  . FUDUCIAL PLACEMENT N/A 01/15/2018   Procedure: PLACEMENT OF FUDUCIAL;  Surgeon: Melrose Nakayama, MD;  Location: [place];  Service: Thoracic;  Laterality: N/A;  . LEFT HEART CATH AND CORONARY ANGIOGRAPHY  2006-2012   a) 2006: minimal CAD, normal EF;; b) 5/'08 - Ant STEMI - 100% p-mLAD (DES x 4 in LAD);; c)2/'09: Ant STEMI for IST/thrombosis of LAD ->PTCA, 80% mCx;; 10/'12: mCx 80-90% - DES PCI, patent LAD stents, EF ~35% - anterolateral AK, apical severe HK.  Marland Kitchen LEFT HEART CATHETERIZATION WITH CORONARY ANGIOGRAM N/A 06/21/2014   Procedure: LEFT HEART CATHETERIZATION WITH CORONARY ANGIOGRAM;  Surgeon: Burnell Blanks, MD;  Location: [facility] CATH LAB: No evidence of obstructive disease.  Patent LAD and circumflex stents.  EF 40-45%.  . LOBECTOMY Right 06/06/2013   Procedure: LOBECTOMY;  Surgeon: Melrose Nakayama, MD;  Location: [place];  Service: Thoracic;  Laterality: Right;  . TRANSTHORACIC ECHOCARDIOGRAM  02/2014   EF 45-50%.  Akinesis of the mid apical anteroseptal wall.  GR 1 DD.  Mild MR.  . VIDEO ASSISTED THORACOSCOPY  (VATS)/WEDGE RESECTION Right 06/06/2013   Procedure: VIDEO ASSISTED THORACOSCOPY (VATS)/WEDGE RESECTION;  Surgeon: Melrose Nakayama, MD;  Location: [place];  Service: Thoracic;  Laterality: Right;  Marland Kitchen VIDEO BRONCHOSCOPY WITH ENDOBRONCHIAL NAVIGATION N/A 01/15/2018   Procedure: VIDEO BRONCHOSCOPY WITH ENDOBRONCHIAL NAVIGATION;  Surgeon: Melrose Nakayama, MD;  Location: [place];  Service: Thoracic;  Laterality: N/A;    FAMILY HISTORY: family history includes Cancer in his sister; Heart attack in his brother; Kidney disease in his mother; Liver disease in his sister.  SOCIAL HISTORY:  reports that he has been smoking cigars. He has a 23.00 pack-year smoking history. He has never used smokeless tobacco. He reports that he drinks about 12.0 standard drinks of alcohol per week. He reports that he does not use drugs.  ALLERGIES: Hydromorphone and Nubain [nalbuphine hcl]  MEDICATIONS:  Current Outpatient Medications  Medication Sig Dispense Refill  . aspirin 81 MG tablet Take 1 tablet (81 mg total) by mouth daily. 30 tablet 1  . B Complex-C (B-COMPLEX WITH VITAMIN C) tablet Take 1 tablet daily by mouth. 30 tablet 0  . cyclobenzaprine (FLEXERIL) 5 MG tablet Take 1 tablet (5 mg total) by mouth 2 (two) times daily as needed for muscle spasms. (Patient taking differently: Take 5 mg by  mouth 2 (two) times daily. ) 30 tablet 0  . esomeprazole (NEXIUM) 20 MG capsule Take 20 mg by mouth daily.     . Glycopyrrolate (LONHALA MAGNAIR REFILL KIT) 25 MCG/ML SOLN Inhale 1 Act into the lungs 2 (two) times daily. 60 mL 11  . levothyroxine (SYNTHROID, LEVOTHROID) 175 MCG tablet Take 1 tablet (175 mcg total) by mouth daily before breakfast. 90 tablet 1  . metoprolol tartrate (LOPRESSOR) 25 MG tablet Take 0.5 tablets (12.5 mg total) by mouth 2 (two) times daily. (Patient taking differently: Take 12.5 mg by mouth See admin instructions. Take 12.33m by mouth once daily, may take additional 12.570mif blood pressure is above  140) 180 tablet 1  . nitroGLYCERIN (NITROSTAT) 0.4 MG SL tablet Place 1 tablet (0.4 mg total) under the tongue every 5 (five) minutes as needed (up to 3 doses). For chest pain 25 tablet 3  . oxyCODONE (OXY IR/ROXICODONE) 5 MG immediate release tablet Take 1 tablet (5 mg total) by mouth every 6 (six) hours as needed for severe pain. 100 tablet 0  . dronabinol (MARINOL) 2.5 MG capsule Take 1 capsule (2.5 mg total) by mouth 2 (two) times daily before lunch and supper. (Patient not taking: Reported on 02/09/2018) 60 capsule 0  . feeding supplement, ENSURE ENLIVE, (ENSURE ENLIVE) LIQD Take 237 mLs by mouth 4 (four) times daily. (Patient not taking: Reported on 02/09/2018) 120 Bottle 11  . nicotine (NICODERM CQ - DOSED IN MG/24 HOURS) 14 mg/24hr patch Apply 1426match daily x 6 wk, then 7 mg patch daily x 2 wk 14 patch 2  . nicotine (NICODERM CQ - DOSED IN MG/24 HR) 7 mg/24hr patch Apply 56m40mtch daily x 6 wk, then 7 mg patch daily x 2 wk 14 patch 0   No current facility-administered medications for this encounter.     REVIEW OF SYSTEMS: as above   PHYSICAL EXAM:  weight is 162 lb 6.4 oz (73.7 kg). His oral temperature is 97.5 F (36.4 C) (abnormal). His blood pressure is 155/81 (abnormal) and his pulse is 78. His oxygen saturation is 100%.   General: Alert and oriented, in no acute distress. HEENT: Head is normocephalic. Extraocular movements are intact. Post-operative changes in his mouth. No sign of tumor.  Neck: Fibrosis in the right neck. Bilateral surgical scars. No palpable cervical or supraclavicular lymphadenopathy. Heart: Regular in rate and rhythm with no murmurs, rubs, or gallops. Chest: Clear to auscultation bilaterally, with no rhonchi, wheezes, or rales. Abdomen: Soft, nontender, nondistended, with no rigidity or guarding. Extremities: No cyanosis or edema. Lymphatics: see Neck Exam Skin: right neck telangiectasias related to prior treatment. Musculoskeletal: He has some tenderness  to palpation in the lower anterior left rib cage. Neurologic: Cranial nerves II through XII are grossly intact. No obvious focalities. Speech is fluent. Coordination is intact. Psychiatric: Judgment and insight are intact. Affect is appropriate.  ECOG = 2  0 - Asymptomatic (Fully active, able to carry on all predisease activities without restriction)  1 - Symptomatic but completely ambulatory (Restricted in physically strenuous activity but ambulatory and able to carry out work of a light or sedentary nature. For example, light housework, office work)  2 - Symptomatic, <50% in bed during the day (Ambulatory and capable of all self care but unable to carry out any work activities. Up and about more than 50% of waking hours)  3 - Symptomatic, >50% in bed, but not bedbound (Capable of only limited self-care, confined to bed or chair  50% or more of waking hours)  4 - Bedbound (Completely disabled. Cannot carry on any self-care. Totally confined to bed or chair)  5 - Death   Eustace Pen MM, Creech RH, Tormey DC, et al. [phone]). "Toxicity and response criteria of the [facility] Group". [place] Oncol. 5 (6): 649-55   LABORATORY DATA:  Lab Results  Component Value Date   WBC 6.0 01/15/2018   HGB 14.5 01/15/2018   HCT 44.4 01/15/2018   MCV 99.6 01/15/2018   PLT 163 01/15/2018   CMP     Component Value Date/Time   NA 133 (L) 01/15/2018 1142   NA 137 03/04/2017 1509   NA 135 (L) 01/12/2017 0845   K 4.0 01/15/2018 1142   K 4.7 01/12/2017 0845   CL 97 (L) 01/15/2018 1142   CO2 23 01/15/2018 1142   CO2 24 01/12/2017 0845   GLUCOSE 82 01/15/2018 1142   GLUCOSE 91 01/12/2017 0845   BUN 6 (L) 01/15/2018 1142   BUN 10 03/04/2017 1509   BUN 8.0 01/12/2017 0845   CREATININE 0.74 01/15/2018 1142   CREATININE 0.9 01/12/2017 0845   CALCIUM 9.0 01/15/2018 1142   CALCIUM 9.7 01/12/2017 0845   PROT 6.8 01/15/2018 1142   PROT 7.1 03/04/2017 1509   PROT 7.7 01/12/2017 0845    ALBUMIN 3.6 01/15/2018 1142   ALBUMIN 4.2 03/04/2017 1509   ALBUMIN 3.6 01/12/2017 0845   AST 32 01/15/2018 1142   AST 21 01/12/2017 0845   ALT 24 01/15/2018 1142   ALT 16 01/12/2017 0845   ALKPHOS 97 01/15/2018 1142   ALKPHOS 94 01/12/2017 0845   BILITOT 1.0 01/15/2018 1142   BILITOT 0.9 03/04/2017 1509   BILITOT 0.41 01/12/2017 0845   GFRNONAA >60 01/15/2018 1142   GFRAA >60 01/15/2018 1142         RADIOGRAPHY: Dg Chest 2 View  Result Date: 01/15/2018 CLINICAL DATA:  Preop for bronchoscopy. EXAM: CHEST - 2 VIEW COMPARISON:  Radiographs of July 07, 2017. CT scan of January 12, 2018. FINDINGS: The heart size and mediastinal contours are within normal limits. No pneumothorax pleural effusion is noted. Right lung is clear. Cavitary left upper lobe nodule is noted as described on prior CT scan. The visualized skeletal structures are unremarkable. IMPRESSION: Cavitary left upper lobe nodule is noted as described on prior CT scan. Electronically Signed   By: Marijo Conception, M.D.   On: 01/15/2018 12:07   Ct Chest Wo Contrast  Result Date: 01/12/2018 CLINICAL DATA:  62 year old male with history of COPD and emphysema. History of right-sided lung cancer with right lobectomy. History of cough timing cancer in 2014 status post surgical resection and radiation therapy. Abnormal PET scan. EXAM: CT CHEST WITHOUT CONTRAST TECHNIQUE: Multidetector CT imaging of the chest was performed following the standard protocol without IV contrast. COMPARISON:  PET-CT 12/17/2017.  Chest CT 11/24/2017. FINDINGS: Cardiovascular: Heart size is normal. There is no significant pericardial fluid, thickening or pericardial calcification. There is aortic atherosclerosis, as well as atherosclerosis of the great vessels of the mediastinum and the coronary arteries, including calcified atherosclerotic plaque in the left anterior descending, left circumflex and right coronary arteries. Coronary artery stents are noted in the  left anterior descending and left circumflex coronary arteries. There is curvilinear hypoattenuation throughout the left ventricular myocardium in the distribution of the left anterior descending coronary artery, indicative of fibrofatty metaplasia from prior LAD territory infarction. Mediastinum/Nodes: No pathologically enlarged mediastinal or hilar lymph nodes. Please note  that accurate exclusion of hilar adenopathy is limited on noncontrast CT scans. Esophagus is unremarkable in appearance. No axillary lymphadenopathy. Lungs/Pleura: Status post right lower lobectomy. Compensatory hyperexpansion of the right middle and lower lobes. Previously noted left upper lobe cavitary nodule is slightly larger than the prior examination measuring 19 x 18 mm, with thickened walls with internal septations and nodular mural areas. No other suspicious appearing pulmonary nodules or masses are noted. No acute consolidative airspace disease. No pleural effusions. Upper Abdomen: Aortic atherosclerosis. Diffuse low attenuation throughout the visualized hepatic parenchyma, indicative of hepatic steatosis. Noncalcified partially cavitary gallstone in the gallbladder measuring 2 cm in diameter. No surrounding inflammatory changes to suggest an acute cholecystitis at this time. Musculoskeletal: Subtle area of lucency and cortical disruption in the anterolateral aspect of the left sixth rib, which corresponds to the previously noted hypermetabolic lesion on prior PET-CT. There are no other aggressive appearing lytic or blastic lesions noted in the visualized portions of the skeleton. IMPRESSION: 1. Progression of disease, with enlargement of the previously noted left upper lobe lesion, and increasing osteolysis of the metastatic lesion in the anterolateral aspect of the left sixth rib. 2. Aortic atherosclerosis, in addition to 3 vessel coronary artery disease. There is also evidence of prior LAD territory myocardial infarction(s). 3.  Hepatic steatosis. 4. Cholelithiasis. 5. Additional incidental findings, as above. Aortic Atherosclerosis (ICD10-I70.0). Electronically Signed   By: Vinnie Langton M.D.   On: 01/12/2018 12:46   Dg C-arm Bronchoscopy  Result Date: 01/15/2018 C-ARM BRONCHOSCOPY: Fluoroscopy was utilized by the requesting physician.  No radiographic interpretation.      IMPRESSION/PLAN: squamous cell carcinoma of the left upper lung, favored to be a third primary cancer, cannot exclude metastatic disease; patient has a subtle left 6th rib lesion that warrants observation and may be metastatic.   He is interested in a pursuing a minimally invasive treatment option and appears to be a good candidate for stereotactic body radiation treatment to the left upper lung lesion. He is opting out of surgery.  I discussed the rationale of this treatment with the patient, as well as the risks, benefits, and side effects of radiotherapy. He is unlikely to have any significant side effects other than some fatigue. We spoke about the risk of rare side effects such as lung damage or rare or internal injuries to organs.  I would anticipate 3 fractions of stereotactic body radiation treatment to the lung. No guarantees of treatment were given. A consent form was signed and placed in the patient's medical record. The patient is enthusiastic about proceeding with treatment. I look forward to participating in the patient's care.  He is scheduled for SBRT CT simulation/planning today. I will also order baseline PFTs.  Regarding his significant weight loss, I will have him meet with a nutritionist to help him gain weight. I advised the patient to start drinking Ensure, Boost, or [employer] as supplements.  Regarding his rib pain, he has some pain in the chest wall which may be related to the small lesion in the 6th anterior rib but this lesion is subtle. We will consider radiation treatment if his symtpoms worsen or if  imaging shows clear progression.  I asked the patient today about tobacco use. The patient uses tobacco.  I advised the patient to quit. Services were offered by me today including outpatient counseling and pharmacotherapy. I assessed for the willingness to attempt to quit and provided encouragement and demonstrated willingness to make referrals and/or prescriptions to  help the patient attempt to quit. I have sent a prescription for the nicotine patch to the patient's pharmacy ([facility] on [address]). The patient is agreeable to quit on the day he starts radiation treatment and knows to apply the patch on that day (Feb 17, 2018) .   __________________________________________   [name], MD  This document serves as a record of services personally performed by [name], MD. It was created on her behalf by Rae Lips, a trained medical scribe. The creation of this record is based on the scribe's personal observations and the provider's statements to them. This document has been checked and approved by the attending provider.

## 2018-02-09 NOTE — Progress Notes (Signed)
  Radiation Oncology         (336) [phone] ________________________________  Name: Trevor Griffin MRN: [id number]  Date: 02/09/2018  DOB: 04/30/1956  4DCT COMPLEX SIMULATION / TREATMENT PLANNING NOTE / SPECIAL TREATMENT PROCEDURE  Outpatient    ICD-10-CM   1. Malignant neoplasm of upper lobe of left lung ([place]) C34.12     The patient was positioned on the CT simulator in a complex treatment device custom fitted to their body: A body fix blue bag. The patient's head was in an Accuform.  The patient's arms were over their head. An abdominal compression device was snugly fitted to decrease the patient's intrathoracic movements.   RESPIRATORY MOTION MANAGEMENT SIMULATION  NARRATIVE:  In order to account for effect of respiratory motion on target structures and other organs in the planning and delivery of radiotherapy, this patient underwent respiratory motion management simulation.  To accomplish this, when the patient was brought to the CT simulation planning suite, 4D respiratory motion management CT images were obtained.  The CT images were loaded into the planning software.  Then, using a variety of tools including Cine, MIP, and standard views, the target volume and planning target volumes (PTV) were delineated.  Avoidance structures were contoured.  Treatment planning then occurred.  Dose volume histograms will be generated and reviewed for each of the requested structure.  I contoured the patient's ITV and increased ITV with tight margins for the PTV. I will prescribe 54 Gy in 3 fractions of 18 Gy per fraction every other day. I requested a DVH of the patient's lungs, target volumes, esophagus, chest wall, heart, spinal cord and airways for 3D conformal planning.  Cone beam CT scans will be performed prior to each fraction to allow close PTV margins and sparing of normal tissues from high doses.  SPECIAL TREATMENT PROCEDURE NOTE:   This constitutes a special treatment procedure due to the  ablative dose delivered and the technical nature of treatment.  This highly technical modality of treatment ensures that the ablative dose is centered on the patient's tumor while sparing normal tissues from excessive dose and risk of detrimental effects. -----------------------------------  [name], MD

## 2018-02-12 ENCOUNTER — Encounter: Payer: Self-pay | Admitting: General Practice

## 2018-02-12 ENCOUNTER — Telehealth: Payer: Self-pay | Admitting: Radiation Oncology
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
## 2018-02-12 NOTE — Telephone Encounter (Signed)
Spoke with patient's wife re transportation after receiving a referral from [employer]. She stated that they did not need rides to their appointments, but rather need gas cards to be able to come to their appointments. Emailed Anne back to see if she is able to help or set them up with the [employer].

## 2018-02-16 ENCOUNTER — Telehealth: Payer: Self-pay | Admitting: *Deleted

## 2018-02-16 NOTE — Telephone Encounter (Signed)
Called patient to inform of PFT for 02-24-18 - arrival time - 9:45 am on 02-24-18, pt. to not have any caffeine, nor in-haler or smoke, spoke with patient and he verified understanding this appt.

## 2018-02-17 ENCOUNTER — Telehealth: Payer: Self-pay | Admitting: Hematology and Oncology

## 2018-02-17 ENCOUNTER — Ambulatory Visit: Payer: Medicare Other | Admitting: Internal Medicine

## 2018-02-17 NOTE — Telephone Encounter (Signed)
Appt scheduled patient notified per 10/16 sch msg °

## 2018-02-18 ENCOUNTER — Encounter: Payer: Medicare Other | Admitting: Nutrition

## 2018-02-23 DIAGNOSIS — Z510 Encounter for antineoplastic radiation therapy: Secondary | ICD-10-CM | POA: Diagnosis not present

## 2018-02-23 DIAGNOSIS — C3412 Malignant neoplasm of upper lobe, left bronchus or lung: Secondary | ICD-10-CM | POA: Diagnosis not present

## 2018-02-24 ENCOUNTER — Ambulatory Visit
Admission: RE | Admit: 2018-02-24 | Discharge: 2018-02-24 | Disposition: A | Discharge status: Home / Self Care | Payer: Medicare Other | Source: Ambulatory Visit | Attending: Radiation Oncology | Admitting: Radiation Oncology

## 2018-02-24 ENCOUNTER — Encounter: Payer: Self-pay | Admitting: Nutrition

## 2018-02-24 ENCOUNTER — Ambulatory Visit (HOSPITAL_COMMUNITY)
Admission: RE | Admit: 2018-02-24 | Discharge: 2018-02-24 | Disposition: A | Discharge status: Home / Self Care | Payer: Medicare Other | Source: Ambulatory Visit | Attending: Radiation Oncology | Admitting: Radiation Oncology

## 2018-02-24 DIAGNOSIS — Z87891 Personal history of nicotine dependence: Secondary | ICD-10-CM | POA: Insufficient documentation

## 2018-02-24 DIAGNOSIS — Z510 Encounter for antineoplastic radiation therapy: Secondary | ICD-10-CM | POA: Diagnosis not present

## 2018-02-24 DIAGNOSIS — J449 Chronic obstructive pulmonary disease, unspecified: Secondary | ICD-10-CM | POA: Diagnosis not present

## 2018-02-24 DIAGNOSIS — C3412 Malignant neoplasm of upper lobe, left bronchus or lung: Secondary | ICD-10-CM | POA: Insufficient documentation

## 2018-02-24 DIAGNOSIS — Z79899 Other long term (current) drug therapy: Secondary | ICD-10-CM | POA: Insufficient documentation

## 2018-02-24 DIAGNOSIS — R918 Other nonspecific abnormal finding of lung field: Secondary | ICD-10-CM | POA: Insufficient documentation

## 2018-02-24 LAB — PULMONARY FUNCTION TEST
DL/VA % pred: 96 %
DL/VA: 4.34 ml/min/mmHg/L
DLCO unc % pred: 64 %
DLCO unc: 18.98 ml/min/mmHg
FEF 25-75 Pre: 1.1 L/sec
FEF2575-%PRED-PRE: 40 %
FEV1-%PRED-PRE: 61 %
FEV1-Pre: 2.06 L
FEV1FVC-%PRED-PRE: 86 %
FEV6-%PRED-PRE: 72 %
FEV6-PRE: 3.02 L
FEV6FVC-%Pred-Pre: 100 %
FVC-%Pred-Pre: 71 %
FVC-Pre: 3.15 L
PRE FEV1/FVC RATIO: 65 %
Pre FEV6/FVC Ratio: 96 %
RV % PRED: 90 %
RV: 1.94 L
TLC % PRED: 77 %
TLC: 5.11 L

## 2018-02-24 MED ORDER — ALBUTEROL SULFATE (2.5 MG/3ML) 0.083% IN NEBU: 2.5000 mg | INHALATION_SOLUTION | Freq: Once | RESPIRATORY_TRACT | Status: DC

## 2018-02-24 NOTE — Progress Notes (Signed)
Late entry Wife called and cancelled nutrition appointment on Feb 18, 2018.

## 2018-02-26 ENCOUNTER — Ambulatory Visit
Admission: RE | Admit: 2018-02-26 | Discharge: 2018-02-26 | Disposition: A | Discharge status: Home / Self Care | Payer: Medicare Other | Source: Ambulatory Visit | Attending: Radiation Oncology | Admitting: Radiation Oncology

## 2018-02-26 DIAGNOSIS — C3412 Malignant neoplasm of upper lobe, left bronchus or lung: Secondary | ICD-10-CM | POA: Diagnosis not present

## 2018-02-26 DIAGNOSIS — Z510 Encounter for antineoplastic radiation therapy: Secondary | ICD-10-CM | POA: Diagnosis not present

## 2018-03-01 ENCOUNTER — Encounter: Payer: Self-pay | Admitting: Radiation Oncology

## 2018-03-01 ENCOUNTER — Telehealth: Payer: Self-pay | Admitting: Hematology and Oncology

## 2018-03-01 ENCOUNTER — Encounter: Payer: Self-pay | Admitting: Hematology and Oncology

## 2018-03-01 ENCOUNTER — Inpatient Hospital Stay: Payer: Medicare Other | Attending: Hematology and Oncology | Admitting: Hematology and Oncology

## 2018-03-01 ENCOUNTER — Ambulatory Visit
Admission: RE | Admit: 2018-03-01 | Discharge: 2018-03-01 | Disposition: A | Discharge status: Home / Self Care | Payer: Medicare Other | Source: Ambulatory Visit | Attending: Radiation Oncology | Admitting: Radiation Oncology

## 2018-03-01 VITALS — BP 151/68 | HR 74 | Resp 18 | Ht 68.0 in | Wt 162.0 lb

## 2018-03-01 DIAGNOSIS — C3430 Malignant neoplasm of lower lobe, unspecified bronchus or lung: Secondary | ICD-10-CM | POA: Diagnosis present

## 2018-03-01 DIAGNOSIS — Z510 Encounter for antineoplastic radiation therapy: Secondary | ICD-10-CM | POA: Diagnosis not present

## 2018-03-01 DIAGNOSIS — C3490 Malignant neoplasm of unspecified part of unspecified bronchus or lung: Secondary | ICD-10-CM | POA: Insufficient documentation

## 2018-03-01 DIAGNOSIS — E039 Hypothyroidism, unspecified: Secondary | ICD-10-CM

## 2018-03-01 DIAGNOSIS — C3412 Malignant neoplasm of upper lobe, left bronchus or lung: Secondary | ICD-10-CM | POA: Diagnosis not present

## 2018-03-01 DIAGNOSIS — C023 Malignant neoplasm of anterior two-thirds of tongue, part unspecified: Secondary | ICD-10-CM | POA: Diagnosis not present

## 2018-03-01 DIAGNOSIS — F17210 Nicotine dependence, cigarettes, uncomplicated: Secondary | ICD-10-CM

## 2018-03-01 DIAGNOSIS — F1010 Alcohol abuse, uncomplicated: Secondary | ICD-10-CM | POA: Diagnosis not present

## 2018-03-01 DIAGNOSIS — I2510 Atherosclerotic heart disease of native coronary artery without angina pectoris: Secondary | ICD-10-CM | POA: Diagnosis not present

## 2018-03-01 NOTE — Assessment & Plan Note (Signed)
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

## 2018-03-01 NOTE — Assessment & Plan Note (Signed)
We had extensive discussion over the past few years about the importance of nicotine cessation He is down to 2 cigarettes/day I encouraged his effort and recommend he continues smoking cessation effort

## 2018-03-01 NOTE — Assessment & Plan Note (Signed)
The patient had frequent alcohol intake We discussed the importance of staying abstinent from alcoholism while on treatment

## 2018-03-01 NOTE — Telephone Encounter (Signed)
Gave avs calendar not printing

## 2018-03-01 NOTE — Progress Notes (Signed)
[place] OFFICE PROGRESS NOTE  Patient Care Team: Janith Lima, MD as PCP - General (Internal Medicine)  ASSESSMENT & PLAN:  Lung cancer [facility]) I have reviewed treatment recommendation from radiation oncologist He will complete radiation treatment today I plan to repeat PET CT scan along with MRI of the brain in December Recent PD L1 testing was positive He would qualify for immunotherapy in the future  Cigarette smoker We had extensive discussion over the past few years about the importance of nicotine cessation He is down to 2 cigarettes/day I encouraged his effort and recommend he continues smoking cessation effort   Alcohol abuse The patient had frequent alcohol intake We discussed the importance of staying abstinent from alcoholism while on treatment   Orders Placed This Encounter  Procedures  . NM PET Image Restag (PS) Skull Base To Thigh    Standing Status:   Future    Standing Expiration Date:   04/05/2019    Order Specific Question:   If indicated for the ordered procedure, I authorize the administration of a radiopharmaceutical per Radiology protocol    Answer:   Yes    Order Specific Question:   Preferred imaging location?    Answer:   [facility]    Order Specific Question:   Radiology Contrast Protocol - do NOT remove file path    Answer:   \\[email or web address]\[email or web address]  . MR BRAIN W WO CONTRAST    Standing Status:   Future    Standing Expiration Date:   03/02/2019    Order Specific Question:   If indicated for the ordered procedure, I authorize the administration of contrast media per Radiology protocol    Answer:   Yes    Order Specific Question:   What is the patient's sedation requirement?    Answer:   No Sedation    Order Specific Question:   Does the patient have a pacemaker or implanted devices?    Answer:   No    Order Specific Question:   Radiology Contrast Protocol - do NOT remove file path    Answer:    \\[email or web address]\Radiant\[email or web address]    Order Specific Question:   Preferred imaging location?    Answer:   [facility] (table limit-350 lbs)  . Comprehensive metabolic panel    Standing Status:   Future    Standing Expiration Date:   04/05/2019  . CBC with Differential/Platelet    Standing Status:   Future    Standing Expiration Date:   04/05/2019  . Lactate dehydrogenase    Standing Status:   Future    Standing Expiration Date:   04/05/2019  . TSH    Standing Status:   Standing    Number of Occurrences:   2    Standing Expiration Date:   03/02/2019    INTERVAL HISTORY: Please see below for problem oriented charting. 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 Right neck LN dissection (zone 2 & 3)---1/21 LNs positive for metastatic [facility]    09/16/2012 Surgery    Tracheotomy, bilateral selective neck dissection, subtotal glossectomy with left radial forearm fasicocutaneous free flap reconstruction with split-thickness skin graft from left thigh ([place])     09/21/2012 Surgery    G-tube placement [facility])    09/23/2012 Procedure    Trach removal [facility])    10/15/2012 Imaging    Staging CT c/a/p: No acute process or evidence of metastatic disease in the chest, abd, or pelvis. Similar tiny RUL lung nodule since 2006, consistent with a benign etiology.     10/25/2012 - 12/10/2012  Radiation Therapy    IMRT Isidore Moos). Right Tongue/ Bilateral Neck /// Total Dose, 60 Gy in 30 Fractions    03/03/2013 Imaging    CT chest: Enlarging (and now cavitary) nodule in RLL measuring 1.3 cm (previous exam measured 4 mm). Suspicious for SCC metastasis.     02/2013 Procedure    PEG removed     05/09/2013 Imaging    CT Chest showed further increase in size of cavitary pulmonary nodule in the posterior right lower lobe now measuring 19 mm, with adjacent ill-defined nodular opacity possibly due to associated postobstructive atelectasis or pneumonitis. 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 Roxan Hockey)    06/06/2013 Pathology Results    Accession: [id number] pathology report from wedge resection of the lung confirmed squamous cell carcinoma of the lung    08/09/2013 Imaging    CT Chest showed interval development of multiple ground-glass attenuating nodules within the left lower lobe which are likely the sequelae of inflammation, infection or aspiration. Short-term followup imaging in 3 months is recommended to ensure resolution. 2. Status post right lower lobectomy. 3. Small right pleural effusion    11/15/2013 Imaging    Postoperative changes of right lower lobectomy. Interval resolution of the left lower lobe ground-glass opacity nodules. Small right pleural effusion persists.    07/13/2014 Imaging    CT chest: s/p RUL lobectomy. No findings to suggest recurrent disease or new mets to thorax. Tiny 4 mm LUL nodule unchanged from 10/2012 and considered benign.      12/14/2014 Imaging    CT chest: Stable postoperative chest CT. No evidence of local reurrence or metastatic disease. Stable postsurgical scarring in right lung.     06/29/2015 Imaging    CXR: Patient evaluated for acute concerns regarding fatigue, cough,  and dysphagia.       03/07/2016 Imaging    Ct chest showed new ill-defined nodular ground-glass throughout the right hemi thorax with some associated septal thickening, worrisome for lymphangitic carcinomatosis. An infectious etiology is not excluded. Aortic atherosclerosis (ICD10-170.0). Coronary artery calcification. Cholelithiasis.    05/08/2016 Imaging    Ct chest showed interval resolution of the infectious or inflammatory process in the right lung. Minimal residual tiny airspace nodules but no infiltrates or effusions.    10/20/2016 Imaging    Stable post treatment changes in the neck without evidence of recurrent disease.    10/20/2016 Imaging    1. Stable examination with scattered tiny pulmonary nodules bilaterally. 2. No acute findings or evidence of metastatic disease. 3. Age advanced coronary artery atherosclerosis. Aortic Atherosclerosis (ICD10-I70.0). 4. Cholelithiasis.    11/24/2017 Imaging    1. Stable and satisfactory post  treatment appearance of the neck. 2. Chest CT today reported separately    11/24/2017 Imaging    1. Thick-walled cavitary nodule in the lingula, grossly stable from 03/16/2017 but new from 10/20/2016 and highly worrisome for malignancy, either metastatic head and neck cancer or primary bronchogenic carcinoma. 2. Aortic atherosclerosis (ICD10-170.0). Coronary artery calcification. 3. Borderline enlarged pulmonary arteries. 4. Hepatic steatosis. 5. Cholelithiasis.    12/17/2017 PET scan    1. The cavitary 1.6 cm left upper lobe nodule has a maximum SUV of 6.9, compatible with malignancy. 2. Faint focus of accentuated activity in the left anterolateral sixth rib with maximum SUV 3.1, slightly asymmetric. No underlying appreciable fracture or underlying CT abnormality. This may well simply be incidental but I would suggest attention to the left anterolateral sixth rib on any follow up imaging. 3. Other imaging findings of potential clinical significance:  Mucous retention cyst in left maxillary sinus. Left mastoid effusion. Aortic Atherosclerosis (ICD10-I70.0). Coronary atherosclerosis. Prior right lower lobectomy. Prior myocardial infarction with fat density in the left ventricular myocardial. Descending and proximal sigmoid colon diverticulosis. Right scrotal hydrocele. Cholelithiasis. Diffuse hepatic steatosis. Right mid kidney cyst.     History of lung cancer (Resolved)   06/06/2013 Pathology Results    1. Lung, wedge biopsy/resection, Right lower lobe - SQUAMOUS CELL CARCINOMA, 3 CM. 2. Lung, wedge biopsy/resection, Right upper lobe - BENIGN LUNG TISSUE. - NO TUMOR IDENTIFIED. 3. Lung, resection (segmental or lobe), Right lower lobe - FOCAL HEMORRHAGE CONSISTENT WITH PREVIOUS WEDGE. - NO RESIDUAL TUMOR IDENTIFIED. - FINAL MARGINS CLEAR. 4. Lymph node, biopsy, 11R#1 - ANTHRACOTIC LYMPH NODE. - NO TUMOR IDENTIFIED 5. Lymph node, biopsy, 11R#2 - ANTHRACOTIC LYMPH NODE. - NO TUMOR IDENTIFIED. 6. Lymph node, biopsy, 10 R #1 - ANTHRACOTIC LYMPH NODE. - NO TUMOR IDENTIFIED. Marland Kitchen 7. Lymph node, biopsy, 10 R #2 - ANTHRACOTIC LYMPH NODE. - NO TUMOR IDENTIFIED. 8. Lymph node, biopsy, level 9 - ANTHRACOTIC LYMPH NODE. - NO TUMOR IDENTIFIED.    06/06/2013 Surgery    Right video-assisted thoracoscopy, wedge resection, thoracoscopic right lower lobectomy, lymph node dissection     Malignant neoplasm of upper lobe of left lung ([place])   12/17/2017 PET scan    IMPRESSION: 1. The cavitary 1.6 cm left upper lobe nodule has a maximum SUV of 6.9, compatible with malignancy. 2. Faint focus of accentuated activity in the left anterolateral sixth rib with maximum SUV 3.1, slightly asymmetric. No underlying appreciable fracture or underlying CT abnormality. This may well simply be incidental but I would suggest attention to the left anterolateral sixth rib on any follow up imaging.  3. Other imaging findings of potential clinical significance: Mucous  retention cyst in left maxillary sinus. Left mastoid effusion. Aortic Atherosclerosis (ICD10-I70.0). Coronary atherosclerosis. Prior right lower lobectomy. Prior myocardial infarction with fat density in the left ventricular myocardial. Descending and proximal sigmoid colon diverticulosis. Right scrotal hydrocele. Cholelithiasis. Diffuse hepatic steatosis. Right mid kidney cyst.     01/15/2018 Pathology Results    Lung, biopsy, Left upper - SQUAMOUS CELL CARCINOMA. - SEE COMMENT. Microscopic Comment Dr Vicente Males has reviewed the case and concurs with this interpretation. Dr. Roxan Hockey was paged on 01/18/18. There is limited tumor present for additional studies if requested.    02/09/2018 Initial Diagnosis    Malignant neoplasm of upper lobe of left lung ([place])    02/09/2018 Cancer Staging    Staging form: Lung, AJCC 8th Edition - Clinical: Stage IA2 (cT1b, cN0, cM0) - Signed by [name], MD  on 02/09/2018    02/24/2018 -  Radiation Therapy    He was treated with SBRT     Lung cancer ([place])   01/15/2018 Pathology Results    Lung, biopsy, Left upper - SQUAMOUS CELL CARCINOMA. - SEE COMMENT. Microscopic Comment Dr Vicente Males has reviewed the case and concurs with this interpretation. Dr. Roxan Hockey was paged on 01/18/18. There is limited tumor present for additional studies if requested.     REVIEW OF SYSTEMS:   Constitutional: Denies fevers, chills or abnormal weight loss Eyes: Denies blurriness of vision Ears, nose, mouth, throat, and face: Denies mucositis or sore throat Cardiovascular: Denies palpitation, chest discomfort or lower extremity swelling Gastrointestinal:  Denies nausea, heartburn or change in bowel habits Skin: Denies abnormal skin rashes Lymphatics: Denies new lymphadenopathy or easy bruising Neurological:Denies numbness, tingling or new weaknesses Behavioral/Psych: Mood is stable, no new changes  All other systems were reviewed with the patient and are  negative.  I have reviewed the past medical history, past surgical history, social history and family history with the patient and they are unchanged from previous note.  ALLERGIES:  is allergic to hydromorphone and nubain [nalbuphine hcl].  MEDICATIONS:  Current Outpatient Medications  Medication Sig Dispense Refill  . aspirin 81 MG tablet Take 1 tablet (81 mg total) by mouth daily. 30 tablet 1  . B Complex-C (B-COMPLEX WITH VITAMIN C) tablet Take 1 tablet daily by mouth. 30 tablet 0  . cyclobenzaprine (FLEXERIL) 5 MG tablet Take 1 tablet (5 mg total) by mouth 2 (two) times daily as needed for muscle spasms. (Patient taking differently: Take 5 mg by mouth 2 (two) times daily. ) 30 tablet 0  . dronabinol (MARINOL) 2.5 MG capsule Take 1 capsule (2.5 mg total) by mouth 2 (two) times daily before lunch and supper. (Patient not taking: Reported on 02/09/2018) 60 capsule 0  . esomeprazole (NEXIUM) 20 MG capsule Take 20 mg by mouth daily.     . feeding supplement, ENSURE ENLIVE, (ENSURE ENLIVE) LIQD Take 237 mLs by mouth 4 (four) times daily. (Patient not taking: Reported on 02/09/2018) 120 Bottle 11  . Glycopyrrolate (LONHALA MAGNAIR REFILL KIT) 25 MCG/ML SOLN Inhale 1 Act into the lungs 2 (two) times daily. 60 mL 11  . levothyroxine (SYNTHROID, LEVOTHROID) 175 MCG tablet Take 1 tablet (175 mcg total) by mouth daily before breakfast. 90 tablet 1  . metoprolol tartrate (LOPRESSOR) 25 MG tablet Take 0.5 tablets (12.5 mg total) by mouth 2 (two) times daily. (Patient taking differently: Take 12.5 mg by mouth See admin instructions. Take 12.31m by mouth once daily, may take additional 12.585mif blood pressure is above 140) 180 tablet 1  . nicotine (NICODERM CQ - DOSED IN MG/24 HOURS) 14 mg/24hr patch Apply 147match daily x 6 wk, then 7 mg patch daily x 2 wk 14 patch 2  . nicotine (NICODERM CQ - DOSED IN MG/24 HR) 7 mg/24hr patch Apply [id number] daily x 6 wk, then 7 mg patch daily x 2 wk 14 patch 0  .  nitroGLYCERIN (NITROSTAT) 0.4 MG SL tablet Place 1 tablet (0.4 mg total) under the tongue every 5 (five) minutes as needed (up to 3 doses). For chest pain 25 tablet 3  . oxyCODONE (OXY IR/ROXICODONE) 5 MG immediate release tablet Take 1 tablet (5 mg total) by mouth every 6 (six) hours as needed for severe pain. 100 tablet 0   No current facility-administered medications for this visit.     PHYSICAL EXAMINATION: ECOG  PERFORMANCE STATUS: 1 - Symptomatic but completely ambulatory  Vitals:   03/01/18 1020  BP: (!) 151/68  Pulse: 74  Resp: 18  SpO2: 100%   Filed Weights   03/01/18 1020  Weight: 162 lb (73.5 kg)    GENERAL:alert, no distress and comfortable SKIN: skin color, texture, turgor are normal, no rashes or significant lesions EYES: normal, Conjunctiva are pink and non-injected, sclera clear OROPHARYNX:no exudate, no erythema and lips, buccal mucosa, and tongue normal  NECK: supple, thyroid normal size, non-tender, without nodularity LYMPH:  no palpable lymphadenopathy in the cervical, axillary or inguinal LUNGS: clear to auscultation and percussion with normal breathing effort HEART: regular rate & rhythm and no murmurs and no lower extremity edema ABDOMEN:abdomen soft, non-tender and normal bowel sounds Musculoskeletal:no cyanosis of digits and no clubbing  NEURO: alert & oriented x 3 with fluent speech, no focal motor/sensory deficits  LABORATORY DATA:  I have reviewed the data as listed    Component Value Date/Time   NA 133 (L) 01/15/2018 1142   NA 137 03/04/2017 1509   NA 135 (L) 01/12/2017 0845   K 4.0 01/15/2018 1142   K 4.7 01/12/2017 0845   CL 97 (L) 01/15/2018 1142   CO2 23 01/15/2018 1142   CO2 24 01/12/2017 0845   GLUCOSE 82 01/15/2018 1142   GLUCOSE 91 01/12/2017 0845   BUN 6 (L) 01/15/2018 1142   BUN 10 03/04/2017 1509   BUN 8.0 01/12/2017 0845   CREATININE 0.74 01/15/2018 1142   CREATININE 0.9 01/12/2017 0845   CALCIUM 9.0 01/15/2018 1142    CALCIUM 9.7 01/12/2017 0845   PROT 6.8 01/15/2018 1142   PROT 7.1 03/04/2017 1509   PROT 7.7 01/12/2017 0845   ALBUMIN 3.6 01/15/2018 1142   ALBUMIN 4.2 03/04/2017 1509   ALBUMIN 3.6 01/12/2017 0845   AST 32 01/15/2018 1142   AST 21 01/12/2017 0845   ALT 24 01/15/2018 1142   ALT 16 01/12/2017 0845   ALKPHOS 97 01/15/2018 1142   ALKPHOS 94 01/12/2017 0845   BILITOT 1.0 01/15/2018 1142   BILITOT 0.9 03/04/2017 1509   BILITOT 0.41 01/12/2017 0845   GFRNONAA >60 01/15/2018 1142   GFRAA >60 01/15/2018 1142    No results found for: SPEP, UPEP  Lab Results  Component Value Date   WBC 6.0 01/15/2018   NEUTROABS 2.7 11/24/2017   HGB 14.5 01/15/2018   HCT 44.4 01/15/2018   MCV 99.6 01/15/2018   PLT 163 01/15/2018      Chemistry      Component Value Date/Time   NA 133 (L) 01/15/2018 1142   NA 137 03/04/2017 1509   NA 135 (L) 01/12/2017 0845   K 4.0 01/15/2018 1142   K 4.7 01/12/2017 0845   CL 97 (L) 01/15/2018 1142   CO2 23 01/15/2018 1142   CO2 24 01/12/2017 0845   BUN 6 (L) 01/15/2018 1142   BUN 10 03/04/2017 1509   BUN 8.0 01/12/2017 0845   CREATININE 0.74 01/15/2018 1142   CREATININE 0.9 01/12/2017 0845      Component Value Date/Time   CALCIUM 9.0 01/15/2018 1142   CALCIUM 9.7 01/12/2017 0845   ALKPHOS 97 01/15/2018 1142   ALKPHOS 94 01/12/2017 0845   AST 32 01/15/2018 1142   AST 21 01/12/2017 0845   ALT 24 01/15/2018 1142   ALT 16 01/12/2017 0845   BILITOT 1.0 01/15/2018 1142   BILITOT 0.9 03/04/2017 1509   BILITOT 0.41 01/12/2017 0845      All  questions were answered. The patient knows to call the clinic with any problems, questions or concerns. No barriers to learning was detected.  I spent 25 minutes counseling the patient face to face. The total time spent in the appointment was 30 minutes and more than 50% was on counseling and review of test results  Heath Lark, MD 03/01/2018 11:45 AM

## 2018-03-03 ENCOUNTER — Other Ambulatory Visit: Payer: Self-pay | Admitting: Internal Medicine

## 2018-03-09 ENCOUNTER — Ambulatory Visit: Payer: Medicare Other | Admitting: Internal Medicine

## 2018-03-09 NOTE — Progress Notes (Signed)
  Radiation Oncology         [phone] ________________________________  Name: Trevor Griffin MRN: [id number]  Date: 03/01/2018  DOB: [birth year]  End of Treatment Note  Diagnosis:   62 y.o. male with Malignant neoplasm of upper lobe of left lung ([place]) Staging form: Lung, AJCC 8th Edition - Clinical: Stage IA2 (cT1b, cN0, cM0) - Signed by [name], MD on 02/09/2018    Indication for treatment:  Curative       Radiation treatment dates:   02/24/2018, 02/26/2018, 03/01/2018  Site/dose:   Left Lung // 54 Gy in 3 fractions  Beams/energy:   SBRT/SRT-VMAT // 6X-FFF Photon  Narrative: The patient tolerated radiation treatment relatively well.   No significant acute effects.   Plan: The patient has completed radiation treatment. The patient will return to radiation oncology clinic for routine followup in one month. I advised them to call or return sooner if they have any questions or concerns related to their recovery or treatment.  -----------------------------------  [name], MD  This document serves as a record of services personally performed by [name], MD. It was created on her behalf by Rae Lips, a trained medical scribe. The creation of this record is based on the scribe's personal observations and the provider's statements to them. This document has been checked and approved by the attending provider.

## 2018-03-29 ENCOUNTER — Ambulatory Visit: Payer: Medicare Other | Admitting: Internal Medicine

## 2018-03-30 ENCOUNTER — Telehealth: Payer: Self-pay

## 2018-03-30 NOTE — Telephone Encounter (Signed)
I received a phone call from Mr. Trevor Griffin's wife. She is cancelling his appointment tomorrow due to her own illness. Mr. Trevor Griffin does not drive. I offered to reschedule the appointment today, but she declined and said she will call back after Thanksgiving to reschedule. Dr. Isidore Moos notified and appointment cancelled.

## 2018-03-31 ENCOUNTER — Ambulatory Visit
Admission: RE | Admit: 2018-03-31 | Discharge: 2018-03-31 | Disposition: A | Discharge status: Home / Self Care | Payer: Medicare Other | Source: Ambulatory Visit | Attending: Radiation Oncology | Admitting: Radiation Oncology

## 2018-04-05 ENCOUNTER — Other Ambulatory Visit: Payer: Self-pay | Admitting: Internal Medicine

## 2018-04-05 ENCOUNTER — Telehealth: Payer: Self-pay | Admitting: Internal Medicine

## 2018-04-05 DIAGNOSIS — G893 Neoplasm related pain (acute) (chronic): Secondary | ICD-10-CM

## 2018-04-05 DIAGNOSIS — M159 Polyosteoarthritis, unspecified: Secondary | ICD-10-CM

## 2018-04-05 DIAGNOSIS — M150 Primary generalized (osteo)arthritis: Principal | ICD-10-CM

## 2018-04-05 DIAGNOSIS — M60862 Other myositis, left lower leg: Secondary | ICD-10-CM

## 2018-04-05 MED ORDER — OXYCODONE HCL 5 MG PO TABS: 5.0000 mg | ORAL_TABLET | Freq: Four times a day (QID) | ORAL | 0 refills | Status: DC | PRN

## 2018-04-05 NOTE — Telephone Encounter (Signed)
Pt is req refill of oxycodone.

## 2018-04-05 NOTE — Telephone Encounter (Signed)
Copied from [place] [id number]. Topic: Quick Communication - Rx Refill/Question >> Apr 05, 2018 12:01 PM Celedonio Savage L wrote: Medication: oxyCODONE (OXY IR/ROXICODONE) 5 MG immediate release tablet  Has the patient contacted their pharmacy? Yes.  Transfer pt to pharmacy (Agent: If no, request that the patient contact the pharmacy for the refill.) (Agent: If yes, when and what did the pharmacy advise?)  Preferred Pharmacy (with phone number or street name): [id number] [address], [place]. [phone] (Phone) [phone] (Fax)    Agent: Please be advised that RX refills may take up to 3 business days. We ask that you follow-up with your pharmacy.

## 2018-04-12 ENCOUNTER — Telehealth: Payer: Self-pay

## 2018-04-12 ENCOUNTER — Telehealth: Payer: Self-pay | Admitting: Hematology and Oncology

## 2018-04-12 NOTE — Telephone Encounter (Signed)
Called and given below message. He verbalized understanding. Scheduling message sent for 12/11.

## 2018-04-12 NOTE — Telephone Encounter (Signed)
-----   Message from Heath Lark, MD sent at 04/12/2018  8:13 AM EST ----- Regarding: labs appt pls call his wife We can move his labs around his imaging so he does not need to come in 4 x

## 2018-04-12 NOTE — Telephone Encounter (Signed)
Scheduled apt per 12/9 sch message - patient is aware of appt date and time

## 2018-04-14 ENCOUNTER — Other Ambulatory Visit: Payer: Medicare Other

## 2018-04-14 ENCOUNTER — Telehealth: Payer: Self-pay

## 2018-04-14 ENCOUNTER — Encounter (HOSPITAL_COMMUNITY): Payer: Medicare Other

## 2018-04-14 NOTE — Telephone Encounter (Signed)
Wife called and left a message to call her.  Called back. She is canceling today's appt for lab and PET scan. Heat went out last night. They are waiting on repair person to call them. Radiology notified and lab canceled. Radiology scheduling number given to wife. She will call to reschedule and call the office back to have lab rescheduled.

## 2018-04-15 ENCOUNTER — Telehealth: Payer: Self-pay | Admitting: Hematology and Oncology

## 2018-04-15 NOTE — Telephone Encounter (Signed)
Called back. PET scan scheduled for 12/23. Scheduling message sent for labs 12/23 and see Dr. Alvy Bimler  12/24.

## 2018-04-15 NOTE — Telephone Encounter (Signed)
Unable to reach patient- called patient per 12/12 sch message - left message with appt date and time

## 2018-04-16 ENCOUNTER — Ambulatory Visit (HOSPITAL_COMMUNITY): Admission: RE | Admit: 2018-04-16 | Payer: Medicare Other | Source: Ambulatory Visit

## 2018-04-19 ENCOUNTER — Other Ambulatory Visit: Payer: Medicare Other

## 2018-04-21 ENCOUNTER — Ambulatory Visit: Payer: Medicare Other | Admitting: Hematology and Oncology

## 2018-04-26 ENCOUNTER — Encounter (HOSPITAL_COMMUNITY)
Admission: RE | Admit: 2018-04-26 | Discharge: 2018-04-26 | Disposition: A | Discharge status: Home / Self Care | Payer: Medicare Other | Source: Ambulatory Visit | Attending: Hematology and Oncology | Admitting: Hematology and Oncology

## 2018-04-26 ENCOUNTER — Telehealth: Payer: Self-pay

## 2018-04-26 ENCOUNTER — Inpatient Hospital Stay: Payer: Medicare Other | Attending: Hematology and Oncology

## 2018-04-26 DIAGNOSIS — C3490 Malignant neoplasm of unspecified part of unspecified bronchus or lung: Secondary | ICD-10-CM | POA: Diagnosis not present

## 2018-04-26 DIAGNOSIS — C023 Malignant neoplasm of anterior two-thirds of tongue, part unspecified: Secondary | ICD-10-CM | POA: Diagnosis not present

## 2018-04-26 DIAGNOSIS — E039 Hypothyroidism, unspecified: Secondary | ICD-10-CM | POA: Diagnosis not present

## 2018-04-26 DIAGNOSIS — C3430 Malignant neoplasm of lower lobe, unspecified bronchus or lung: Secondary | ICD-10-CM | POA: Diagnosis present

## 2018-04-26 LAB — CBC WITH DIFFERENTIAL/PLATELET
Abs Immature Granulocytes: 0.01 10*3/uL (ref 0.00–0.07)
BASOS ABS: 0 10*3/uL (ref 0.0–0.1)
Basophils Relative: 1 %
EOS ABS: 0.1 10*3/uL (ref 0.0–0.5)
EOS PCT: 1 %
HEMATOCRIT: 42.7 % (ref 39.0–52.0)
Hemoglobin: 14.1 g/dL (ref 13.0–17.0)
IMMATURE GRANULOCYTES: 0 %
LYMPHS ABS: 0.6 10*3/uL — AB (ref 0.7–4.0)
Lymphocytes Relative: 15 %
MCH: 32.5 pg (ref 26.0–34.0)
MCHC: 33 g/dL (ref 30.0–36.0)
MCV: 98.4 fL (ref 80.0–100.0)
Monocytes Absolute: 0.5 10*3/uL (ref 0.1–1.0)
Monocytes Relative: 12 %
NEUTROS PCT: 71 %
NRBC: 0 % (ref 0.0–0.2)
Neutro Abs: 3.2 10*3/uL (ref 1.7–7.7)
PLATELETS: 177 10*3/uL (ref 150–400)
RBC: 4.34 MIL/uL (ref 4.22–5.81)
RDW: 14.1 % (ref 11.5–15.5)
WBC: 4.4 10*3/uL (ref 4.0–10.5)

## 2018-04-26 LAB — COMPREHENSIVE METABOLIC PANEL
ALK PHOS: 97 U/L (ref 38–126)
ALT: 14 U/L (ref 0–44)
ANION GAP: 8 (ref 5–15)
AST: 17 U/L (ref 15–41)
Albumin: 3.8 g/dL (ref 3.5–5.0)
BILIRUBIN TOTAL: 0.7 mg/dL (ref 0.3–1.2)
BUN: 11 mg/dL (ref 8–23)
CALCIUM: 9.5 mg/dL (ref 8.9–10.3)
CO2: 28 mmol/L (ref 22–32)
Chloride: 100 mmol/L (ref 98–111)
Creatinine, Ser: 0.95 mg/dL (ref 0.61–1.24)
GFR calc non Af Amer: >60 mL/min (ref >60)
Glucose, Bld: 91 mg/dL (ref 70–99)
Potassium: 4.4 mmol/L (ref 3.5–5.1)
Sodium: 136 mmol/L (ref 135–145)
TOTAL PROTEIN: 7.4 g/dL (ref 6.5–8.1)

## 2018-04-26 LAB — TSH: TSH: 8.061 u[IU]/mL — AB (ref 0.320–4.118)

## 2018-04-26 LAB — GLUCOSE, CAPILLARY: GLUCOSE-CAPILLARY: 90 mg/dL (ref 70–99)

## 2018-04-26 LAB — LACTATE DEHYDROGENASE: LDH: 136 U/L (ref 98–192)

## 2018-04-26 MED ORDER — FLUDEOXYGLUCOSE F - 18 (FDG) INJECTION
8.7000 | Freq: Once | INTRAVENOUS | Status: AC | PRN
  Administered 2018-04-26: 8.7 via INTRAVENOUS

## 2018-04-26 NOTE — Telephone Encounter (Signed)
-----   Message from Heath Lark, MD sent at 04/26/2018  1:45 PM EST ----- Regarding: reschedule appt He missed his scans recently PET scan is today but MRI is Friday I recommend cancelling appt tomorrow and see me next week instead I am full on Monday I can see him Tuesday morning Please call his wife to reschedule. She might be at PET scan with him now

## 2018-04-26 NOTE — Telephone Encounter (Signed)
Called and given below message. Verbalized understanding. Appt rescheduled to 1/31 at 1015. Wife verbalized understanding.

## 2018-04-27 ENCOUNTER — Ambulatory Visit: Payer: Medicare Other | Admitting: Hematology and Oncology

## 2018-04-30 ENCOUNTER — Telehealth: Payer: Self-pay | Admitting: Hematology and Oncology

## 2018-04-30 ENCOUNTER — Ambulatory Visit (HOSPITAL_COMMUNITY): Payer: Medicare Other

## 2018-04-30 NOTE — Telephone Encounter (Signed)
R/s appt per 12/27 sch message- pt wife is aware of appt date and time

## 2018-05-04 ENCOUNTER — Ambulatory Visit: Payer: Medicare Other | Admitting: Hematology and Oncology

## 2018-05-07 ENCOUNTER — Ambulatory Visit (HOSPITAL_COMMUNITY): Payer: Medicare Other

## 2018-05-10 ENCOUNTER — Ambulatory Visit (HOSPITAL_COMMUNITY)
Admission: RE | Admit: 2018-05-10 | Discharge: 2018-05-10 | Disposition: A | Discharge status: Home / Self Care | Payer: Medicare Other | Source: Ambulatory Visit | Attending: Hematology and Oncology | Admitting: Hematology and Oncology

## 2018-05-10 ENCOUNTER — Telehealth: Payer: Self-pay

## 2018-05-10 DIAGNOSIS — C3490 Malignant neoplasm of unspecified part of unspecified bronchus or lung: Secondary | ICD-10-CM

## 2018-05-10 NOTE — Telephone Encounter (Signed)
Called and given appt time for MRI on 1/8 at 8 am. To arrive at [place] to the MRI department. He verbalized understanding.

## 2018-05-10 NOTE — Telephone Encounter (Signed)
Wife called and left a message that radiology was unable to do MRI today. Should they keep scheduled appt for tomorrow with Dr. Alvy Bimler.  Called back. Per Dr. Alvy Bimler, keep scheduled appt. Wife verbalized understanding.

## 2018-05-11 ENCOUNTER — Inpatient Hospital Stay: Payer: Medicare Other | Attending: Hematology and Oncology | Admitting: Hematology and Oncology

## 2018-05-11 ENCOUNTER — Telehealth: Payer: Self-pay | Admitting: Hematology and Oncology

## 2018-05-11 ENCOUNTER — Encounter: Payer: Self-pay | Admitting: Hematology and Oncology

## 2018-05-11 VITALS — BP 138/68 | HR 85 | Temp 97.8°F | Resp 18 | Ht 68.0 in | Wt 164.6 lb

## 2018-05-11 DIAGNOSIS — Z79899 Other long term (current) drug therapy: Secondary | ICD-10-CM | POA: Diagnosis not present

## 2018-05-11 DIAGNOSIS — R220 Localized swelling, mass and lump, head: Secondary | ICD-10-CM | POA: Diagnosis not present

## 2018-05-11 DIAGNOSIS — J449 Chronic obstructive pulmonary disease, unspecified: Secondary | ICD-10-CM | POA: Insufficient documentation

## 2018-05-11 DIAGNOSIS — Z85118 Personal history of other malignant neoplasm of bronchus and lung: Secondary | ICD-10-CM | POA: Insufficient documentation

## 2018-05-11 DIAGNOSIS — Z7982 Long term (current) use of aspirin: Secondary | ICD-10-CM | POA: Diagnosis not present

## 2018-05-11 DIAGNOSIS — C7951 Secondary malignant neoplasm of bone: Secondary | ICD-10-CM | POA: Insufficient documentation

## 2018-05-11 DIAGNOSIS — Z923 Personal history of irradiation: Secondary | ICD-10-CM | POA: Diagnosis not present

## 2018-05-11 DIAGNOSIS — J069 Acute upper respiratory infection, unspecified: Secondary | ICD-10-CM | POA: Insufficient documentation

## 2018-05-11 DIAGNOSIS — G893 Neoplasm related pain (acute) (chronic): Secondary | ICD-10-CM | POA: Diagnosis not present

## 2018-05-11 DIAGNOSIS — R05 Cough: Secondary | ICD-10-CM | POA: Insufficient documentation

## 2018-05-11 DIAGNOSIS — I10 Essential (primary) hypertension: Secondary | ICD-10-CM | POA: Insufficient documentation

## 2018-05-11 DIAGNOSIS — Z87891 Personal history of nicotine dependence: Secondary | ICD-10-CM | POA: Insufficient documentation

## 2018-05-11 DIAGNOSIS — Z85810 Personal history of malignant neoplasm of tongue: Secondary | ICD-10-CM | POA: Diagnosis not present

## 2018-05-11 DIAGNOSIS — J029 Acute pharyngitis, unspecified: Secondary | ICD-10-CM | POA: Diagnosis not present

## 2018-05-11 DIAGNOSIS — C3490 Malignant neoplasm of unspecified part of unspecified bronchus or lung: Secondary | ICD-10-CM

## 2018-05-11 DIAGNOSIS — Z7189 Other specified counseling: Secondary | ICD-10-CM | POA: Insufficient documentation

## 2018-05-11 DIAGNOSIS — C3412 Malignant neoplasm of upper lobe, left bronchus or lung: Secondary | ICD-10-CM | POA: Diagnosis not present

## 2018-05-11 MED ORDER — PREDNISONE 20 MG PO TABS: 20.0000 mg | ORAL_TABLET | Freq: Every day | ORAL | 0 refills | Status: DC

## 2018-05-11 NOTE — Telephone Encounter (Signed)
No los °

## 2018-05-11 NOTE — Assessment & Plan Note (Signed)
I have reviewed his recent repeat PET CT scan Unfortunately, he has new rib lesion He is symptomatic from it We discussed the role of immunotherapy versus palliative radiation treatment The patient is not keen to be treated with systemic treatment We discussed the risk, benefits, side effects of pembrolizumab but he is not willing to proceed We are waiting for MRI imaging of his brain I will call him and his wife test results If MRI show evidence of brain metastasis, he will undergo palliative radiation therapy to his brain and rib cage However, if MRI of the brain showed no evidence of brain metastasis, he will undergo palliative radiation therapy to the rib cage only After completion of radiation treatment, I plan to wait 6 weeks and then repeat PET CT scan and see him back He would like to reserve immunotherapy as his last option

## 2018-05-11 NOTE — Progress Notes (Signed)
[place] OFFICE PROGRESS NOTE  Patient Care Team: Janith Lima, MD as PCP - General (Internal Medicine)  ASSESSMENT & PLAN:  Lung cancer [facility]) I have reviewed his recent repeat PET CT scan Unfortunately, he has new rib lesion He is symptomatic from it We discussed the role of immunotherapy versus palliative radiation treatment The patient is not keen to be treated with systemic treatment We discussed the risk, benefits, side effects of pembrolizumab but he is not willing to proceed We are waiting for MRI imaging of his brain I will call him and his wife test results If MRI show evidence of brain metastasis, he will undergo palliative radiation therapy to his brain and rib cage However, if MRI of the brain showed no evidence of brain metastasis, he will undergo palliative radiation therapy to the rib cage only After completion of radiation treatment, I plan to wait 6 weeks and then repeat PET CT scan and see him back He would like to reserve immunotherapy as his last option  Metastasis to bone [facility]) He is symptomatic with bone pain I recommend low-dose prednisone therapy along with his prescribed pain medicine I recommend follow-up with his primary care doctor for chronic pain management  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) ([place]) The patient has mild COPD Examination showed no evidence of wheezes The patient has quit smoking PET CT scan show no evidence of pneumonia I recommend low-dose prednisone therapy for 2 weeks  Goals of care, counseling/discussion I have a long discussion with the patient and his wife With evidence of bone metastasis, he has stage IV disease We discussed the role of palliative systemic treatment versus radiation therapy He is not willing to consider immunotherapy or chemotherapy I will see him again in a few months for further follow-up, after completion of radiation treatment   No orders of the defined types were  placed in this encounter.   INTERVAL HISTORY: Please see below for problem oriented charting. The patient returns to follow-up on test results His PET CT scan was canceled and delayed for 2 weeks.  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 mucosal margin, Grade 2, PNI+, LVI neg, 1 oral LN neg. Right neck LN dissection (zone 2 & 3)---1/21 LNs positive for metastatic [facility]    09/16/2012 Surgery    Tracheotomy, bilateral selective neck dissection, subtotal glossectomy with left radial forearm fasicocutaneous free flap reconstruction with split-thickness skin graft from left thigh ([place])     09/21/2012 Surgery    G-tube placement [facility])    09/23/2012 Procedure    Trach removal [facility])    10/15/2012 Imaging    Staging CT c/a/p: No acute process or evidence of metastatic disease in the chest, abd, or pelvis. Similar tiny RUL lung nodule since 2006, consistent with a benign etiology.  10/25/2012 - 12/10/2012 Radiation Therapy    IMRT Isidore Moos). Right Tongue/ Bilateral Neck /// Total Dose, 60 Gy in 30 Fractions    03/03/2013 Imaging    CT chest: Enlarging (and now cavitary) nodule in RLL measuring 1.3 cm (previous exam measured 4 mm). Suspicious for SCC metastasis.     02/2013 Procedure    PEG removed     05/09/2013 Imaging    CT Chest  showed further increase in size of cavitary pulmonary nodule in the posterior right lower lobe now measuring 19 mm, with adjacent ill-defined nodular opacity possibly due to associated postobstructive atelectasis or pneumonitis. 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 Roxan Hockey)    06/06/2013 Pathology Results    Accession: [id number] pathology report from wedge resection of the lung confirmed squamous cell carcinoma of the lung    08/09/2013 Imaging    CT Chest showed interval development of multiple ground-glass attenuating nodules within the left lower lobe which are likely the sequelae of inflammation, infection or aspiration. Short-term followup imaging in 3 months is recommended to ensure resolution. 2. Status post right lower lobectomy. 3. Small right pleural effusion    11/15/2013 Imaging    Postoperative changes of right lower lobectomy. Interval resolution of the left lower lobe ground-glass opacity nodules. Small right pleural effusion persists.    07/13/2014 Imaging    CT chest: s/p RUL lobectomy. No findings to suggest recurrent disease or new mets to thorax. Tiny 4 mm LUL nodule unchanged from 10/2012 and considered benign.      12/14/2014 Imaging    CT chest: Stable postoperative chest CT. No evidence of local reurrence or metastatic disease. Stable postsurgical scarring in right lung.     06/29/2015 Imaging    CXR: Patient evaluated for acute concerns regarding fatigue, cough, and dysphagia.       03/07/2016 Imaging    Ct chest showed new ill-defined nodular ground-glass throughout the right hemi thorax with some associated septal thickening, worrisome for lymphangitic carcinomatosis. An infectious etiology is not excluded. Aortic atherosclerosis (ICD10-170.0). Coronary artery calcification. Cholelithiasis.     05/08/2016 Imaging    Ct chest showed interval resolution of the infectious or inflammatory process in the right lung. Minimal residual tiny airspace nodules but no infiltrates or effusions.    10/20/2016 Imaging    Stable post treatment changes in the neck without evidence of recurrent disease.    10/20/2016 Imaging    1. Stable examination with scattered tiny pulmonary nodules bilaterally. 2. No acute findings or evidence of metastatic disease. 3. Age advanced coronary artery atherosclerosis. Aortic Atherosclerosis (ICD10-I70.0). 4. Cholelithiasis.    11/24/2017 Imaging    1. Stable and satisfactory post treatment appearance of the neck. 2. Chest CT today reported separately    11/24/2017 Imaging    1. Thick-walled cavitary nodule in the lingula, grossly stable from 03/16/2017 but new from 10/20/2016 and highly worrisome for malignancy, either metastatic head and neck cancer or primary bronchogenic carcinoma. 2. Aortic atherosclerosis (ICD10-170.0). Coronary artery calcification. 3. Borderline enlarged pulmonary arteries. 4. Hepatic steatosis. 5. Cholelithiasis.    12/17/2017 PET scan    1. The cavitary 1.6 cm left upper lobe nodule has a maximum SUV of 6.9, compatible with malignancy. 2. Faint focus of accentuated activity in the left anterolateral sixth rib with maximum SUV 3.1, slightly asymmetric. No underlying appreciable fracture or underlying  CT abnormality. This may well simply be incidental but I would suggest attention to the left anterolateral sixth rib on any follow up imaging. 3. Other imaging findings of potential clinical significance: Mucous retention cyst in left maxillary sinus. Left mastoid effusion. Aortic Atherosclerosis (ICD10-I70.0). Coronary atherosclerosis. Prior right lower lobectomy. Prior myocardial infarction with fat density in the left ventricular myocardial. Descending and proximal sigmoid colon diverticulosis. Right scrotal hydrocele. Cholelithiasis. Diffuse  hepatic steatosis. Right mid kidney cyst.     History of lung cancer (Resolved)   06/06/2013 Pathology Results    1. Lung, wedge biopsy/resection, Right lower lobe - SQUAMOUS CELL CARCINOMA, 3 CM. 2. Lung, wedge biopsy/resection, Right upper lobe - BENIGN LUNG TISSUE. - NO TUMOR IDENTIFIED. 3. Lung, resection (segmental or lobe), Right lower lobe - FOCAL HEMORRHAGE CONSISTENT WITH PREVIOUS WEDGE. - NO RESIDUAL TUMOR IDENTIFIED. - FINAL MARGINS CLEAR. 4. Lymph node, biopsy, 11R#1 - ANTHRACOTIC LYMPH NODE. - NO TUMOR IDENTIFIED 5. Lymph node, biopsy, 11R#2 - ANTHRACOTIC LYMPH NODE. - NO TUMOR IDENTIFIED. 6. Lymph node, biopsy, 10 R #1 - ANTHRACOTIC LYMPH NODE. - NO TUMOR IDENTIFIED. Marland Kitchen 7. Lymph node, biopsy, 10 R #2 - ANTHRACOTIC LYMPH NODE. - NO TUMOR IDENTIFIED. 8. Lymph node, biopsy, level 9 - ANTHRACOTIC LYMPH NODE. - NO TUMOR IDENTIFIED.    06/06/2013 Surgery    Right video-assisted thoracoscopy, wedge resection, thoracoscopic right lower lobectomy, lymph node dissection     Malignant neoplasm of upper lobe of left lung ([place])   12/17/2017 PET scan    IMPRESSION: 1. The cavitary 1.6 cm left upper lobe nodule has a maximum SUV of 6.9, compatible with malignancy. 2. Faint focus of accentuated activity in the left anterolateral sixth rib with maximum SUV 3.1, slightly asymmetric. No underlying appreciable fracture or underlying CT abnormality. This may well simply be incidental but I would suggest attention to the left anterolateral sixth rib on any follow up imaging.  3. Other imaging findings of potential clinical significance: Mucous retention cyst in left maxillary sinus. Left mastoid effusion. Aortic Atherosclerosis (ICD10-I70.0). Coronary atherosclerosis. Prior right lower lobectomy. Prior myocardial infarction with fat density in the left ventricular myocardial. Descending and proximal sigmoid colon diverticulosis. Right scrotal hydrocele. Cholelithiasis. Diffuse hepatic  steatosis. Right mid kidney cyst.     01/15/2018 Pathology Results    Lung, biopsy, Left upper - SQUAMOUS CELL CARCINOMA. - SEE COMMENT. Microscopic Comment Dr Vicente Males has reviewed the case and concurs with this interpretation. Dr. Roxan Hockey was paged on 01/18/18. There is limited tumor present for additional studies if requested.    02/09/2018 Initial Diagnosis    Malignant neoplasm of upper lobe of left lung ([place])    02/09/2018 Cancer Staging    Staging form: Lung, AJCC 8th Edition - Clinical: Stage IA2 (cT1b, cN0, cM0) - Signed by [name], MD on 02/09/2018    02/24/2018 - 03/01/2018 Radiation Therapy    He was treated with SBRT Radiation treatment dates:   02/24/2018, 02/26/2018, 03/01/2018  Site/dose:   Left Lung // 54 Gy in 3 fractions  Beams/energy:   SBRT/SRT-VMAT // 6X-FFF Photon      Lung cancer ([place])   01/15/2018 Pathology Results    Lung, biopsy, Left upper - SQUAMOUS CELL CARCINOMA. - SEE COMMENT. Microscopic Comment Dr Vicente Males has reviewed the case and concurs with this interpretation. Dr. Roxan Hockey was paged on 01/18/18. There is limited tumor present for additional studies if requested.    04/26/2018 PET scan    1. Unfortunately, there is a  new metastatic lesion in the anteromedial LEFT sixth rib with intense metabolic activity. 2. Interval decrease in size and metabolic activity of LEFT upper lobe cavitary nodule 3. No evidence additional  metastatic disease.     05/11/2018 Cancer Staging    Staging form: Lung, AJCC 8th Edition - Clinical: Stage IV (cT1c, cN0, pM1) - Signed by Heath Lark, MD on 05/11/2018     Radiation Therapy        REVIEW OF SYSTEMS:   Constitutional: Denies fevers, chills or abnormal weight loss Eyes: Denies blurriness of vision Ears, nose, mouth, throat, and face: Denies mucositis or sore throat Respiratory: Denies cough, dyspnea or wheezes Cardiovascular: Denies palpitation, chest discomfort or lower extremity  swelling Gastrointestinal:  Denies nausea, heartburn or change in bowel habits Skin: Denies abnormal skin rashes Lymphatics: Denies new lymphadenopathy or easy bruising Neurological:Denies numbness, tingling or new weaknesses Behavioral/Psych: Mood is stable, no new changes  All other systems were reviewed with the patient and are negative.  I have reviewed the past medical history, past surgical history, social history and family history with the patient and they are unchanged from previous note.  ALLERGIES:  is allergic to hydromorphone and nubain [nalbuphine hcl].  MEDICATIONS:  Current Outpatient Medications  Medication Sig Dispense Refill  . aspirin 81 MG tablet Take 1 tablet (81 mg total) by mouth daily. 30 tablet 1  . B Complex-C (B-COMPLEX WITH VITAMIN C) tablet Take 1 tablet daily by mouth. 30 tablet 0  . cyclobenzaprine (FLEXERIL) 5 MG tablet TAKE 1 TABLET BY MOUTH 2 TIMES DAILY AS NEEDED FOR MUSCLE SPASMS 60 tablet 3  . dronabinol (MARINOL) 2.5 MG capsule Take 1 capsule (2.5 mg total) by mouth 2 (two) times daily before lunch and supper. (Patient not taking: Reported on 02/09/2018) 60 capsule 0  . esomeprazole (NEXIUM) 20 MG capsule Take 20 mg by mouth daily.     . feeding supplement, ENSURE ENLIVE, (ENSURE ENLIVE) LIQD Take 237 mLs by mouth 4 (four) times daily. (Patient not taking: Reported on 02/09/2018) 120 Bottle 11  . Glycopyrrolate (LONHALA MAGNAIR REFILL KIT) 25 MCG/ML SOLN Inhale 1 Act into the lungs 2 (two) times daily. 60 mL 11  . levothyroxine (SYNTHROID, LEVOTHROID) 175 MCG tablet Take 1 tablet (175 mcg total) by mouth daily before breakfast. 90 tablet 1  . metoprolol tartrate (LOPRESSOR) 25 MG tablet Take 0.5 tablets (12.5 mg total) by mouth 2 (two) times daily. (Patient taking differently: Take 12.5 mg by mouth See admin instructions. Take 12.15m by mouth once daily, may take additional 12.572mif blood pressure is above 140) 180 tablet 1  . nicotine (NICODERM CQ -  DOSED IN MG/24 HOURS) 14 mg/24hr patch Apply 1419match daily x 6 wk, then 7 mg patch daily x 2 wk 14 patch 2  . nicotine (NICODERM CQ - DOSED IN MG/24 HR) 7 mg/24hr patch Apply [id number] daily x 6 wk, then 7 mg patch daily x 2 wk 14 patch 0  . nitroGLYCERIN (NITROSTAT) 0.4 MG SL tablet Place 1 tablet (0.4 mg total) under the tongue every 5 (five) minutes as needed (up to 3 doses). For chest pain 25 tablet 3  . oxyCODONE (OXY IR/ROXICODONE) 5 MG immediate release tablet Take 1 tablet (5 mg total) by mouth every 6 (six) hours as needed for severe pain. 100 tablet 0  . predniSONE (DELTASONE) 20 MG tablet Take 1 tablet (20 mg total) by mouth daily with breakfast. 14 tablet 0   No current facility-administered medications  for this visit.     PHYSICAL EXAMINATION: ECOG PERFORMANCE STATUS: 2 - Symptomatic, <50% confined to bed  Vitals:   05/11/18 1222  BP: 138/68  Pulse: 85  Resp: 18  Temp: 97.8 F (36.6 C)  SpO2: 100%   Filed Weights   05/11/18 1222  Weight: 164 lb 9.6 oz (74.7 kg)    GENERAL:alert, no distress and comfortable SKIN: skin color, texture, turgor are normal, no rashes or significant lesions EYES: normal, Conjunctiva are pink and non-injected, sclera clear OROPHARYNX:no exudate, no erythema and lips, buccal mucosa, and tongue normal  NECK: supple, thyroid normal size, non-tender, without nodularity LYMPH:  no palpable lymphadenopathy in the cervical, axillary or inguinal LUNGS: clear to auscultation and percussion with normal breathing effort HEART: regular rate & rhythm and no murmurs and no lower extremity edema ABDOMEN:abdomen soft, non-tender and normal bowel sounds Musculoskeletal:no cyanosis of digits and no clubbing.  He has palpable chest wall tenderness on exam NEURO: alert & oriented x 3 with fluent speech, no focal motor/sensory deficits  LABORATORY DATA:  I have reviewed the data as listed    Component Value Date/Time   NA 136 04/26/2018 1319   NA 137  03/04/2017 1509   NA 135 (L) 01/12/2017 0845   K 4.4 04/26/2018 1319   K 4.7 01/12/2017 0845   CL 100 04/26/2018 1319   CO2 28 04/26/2018 1319   CO2 24 01/12/2017 0845   GLUCOSE 91 04/26/2018 1319   GLUCOSE 91 01/12/2017 0845   BUN 11 04/26/2018 1319   BUN 10 03/04/2017 1509   BUN 8.0 01/12/2017 0845   CREATININE 0.95 04/26/2018 1319   CREATININE 0.9 01/12/2017 0845   CALCIUM 9.5 04/26/2018 1319   CALCIUM 9.7 01/12/2017 0845   PROT 7.4 04/26/2018 1319   PROT 7.1 03/04/2017 1509   PROT 7.7 01/12/2017 0845   ALBUMIN 3.8 04/26/2018 1319   ALBUMIN 4.2 03/04/2017 1509   ALBUMIN 3.6 01/12/2017 0845   AST 17 04/26/2018 1319   AST 21 01/12/2017 0845   ALT 14 04/26/2018 1319   ALT 16 01/12/2017 0845   ALKPHOS 97 04/26/2018 1319   ALKPHOS 94 01/12/2017 0845   BILITOT 0.7 04/26/2018 1319   BILITOT 0.9 03/04/2017 1509   BILITOT 0.41 01/12/2017 0845   GFRNONAA >60 04/26/2018 1319   GFRAA >60 04/26/2018 1319    No results found for: SPEP, UPEP  Lab Results  Component Value Date   WBC 4.4 04/26/2018   NEUTROABS 3.2 04/26/2018   HGB 14.1 04/26/2018   HCT 42.7 04/26/2018   MCV 98.4 04/26/2018   PLT 177 04/26/2018      Chemistry      Component Value Date/Time   NA 136 04/26/2018 1319   NA 137 03/04/2017 1509   NA 135 (L) 01/12/2017 0845   K 4.4 04/26/2018 1319   K 4.7 01/12/2017 0845   CL 100 04/26/2018 1319   CO2 28 04/26/2018 1319   CO2 24 01/12/2017 0845   BUN 11 04/26/2018 1319   BUN 10 03/04/2017 1509   BUN 8.0 01/12/2017 0845   CREATININE 0.95 04/26/2018 1319   CREATININE 0.9 01/12/2017 0845      Component Value Date/Time   CALCIUM 9.5 04/26/2018 1319   CALCIUM 9.7 01/12/2017 0845   ALKPHOS 97 04/26/2018 1319   ALKPHOS 94 01/12/2017 0845   AST 17 04/26/2018 1319   AST 21 01/12/2017 0845   ALT 14 04/26/2018 1319   ALT 16 01/12/2017 0845   BILITOT 0.7  04/26/2018 1319   BILITOT 0.9 03/04/2017 1509   BILITOT 0.41 01/12/2017 0845       RADIOGRAPHIC  STUDIES: I have reviewed imaging study with the patient and his wife I have personally reviewed the radiological images as listed and agreed with the findings in the report. Nm Pet Image Restag (ps) Skull Base To Thigh  Result Date: 04/26/2018 CLINICAL DATA:  Subsequent treatment strategy for non-small cell lung cancer. EXAM: NUCLEAR MEDICINE PET SKULL BASE TO THIGH TECHNIQUE: 8.2 mCi F-18 FDG was injected intravenously. Full-ring PET imaging was performed from the skull base to thigh after the radiotracer. CT data was obtained and used for attenuation correction and anatomic localization. Fasting blood glucose: 90 mg/dl COMPARISON:  None. FINDINGS: Mediastinal blood pool activity: SUV max 1.9 NECK: None Incidental CT findings: none CHEST: Cavitary lesion in the LEFT upper lobe is decreased in size measuring 11 x 10 mm compared with 14 x 16 mm. Metabolic activity is lesions also decreased with SUV max equal 2.5 decreased from 6.9. No new hypermetabolic pulmonary nodules. Mildly hypermetabolic small RIGHT axial lymph node with SUV max equal 2.3 is unchanged from comparison exam. Similar findings in the LEFT axilla. These are favored benign reactive nodes. There is however increased metabolic activity of the anterior LEFT sixth rib lesion with SUV max equal 12.7 increased from 3.1. Additionally there is a soft tissue component of this lesion now expanding the bone measuring 2.6 x 1.5 cm. Incidental CT findings: none ABDOMEN/PELVIS: No abnormal hypermetabolic activity within the liver, pancreas, adrenal glands, or spleen. No hypermetabolic lymph nodes in the abdomen or pelvis. Incidental CT findings: Moderate RIGHT hydrocele unchanged. Cholelithiasis without cholecystitis. SKELETON: Metastatic disease in the anterior LEFT sixth rib described in the chest section. No additional evidence skeletal metastasis. Incidental CT findings: none IMPRESSION: 1. Unfortunately, there is a new metastatic lesion in the  anteromedial LEFT sixth rib with intense metabolic activity. 2. Interval decrease in size and metabolic activity of LEFT upper lobe cavitary nodule 3. No evidence additional  metastatic disease. Electronically Signed   By: Suzy Bouchard M.D.   On: 04/26/2018 15:30    All questions were answered. The patient knows to call the clinic with any problems, questions or concerns. No barriers to learning was detected.  I spent 30 minutes counseling the patient face to face. The total time spent in the appointment was 40 minutes and more than 50% was on counseling and review of test results  Heath Lark, MD 05/11/2018 3:13 PM

## 2018-05-11 NOTE — Assessment & Plan Note (Signed)
The patient has mild COPD Examination showed no evidence of wheezes The patient has quit smoking PET CT scan show no evidence of pneumonia I recommend low-dose prednisone therapy for 2 weeks

## 2018-05-11 NOTE — Assessment & Plan Note (Signed)
He is symptomatic with bone pain I recommend low-dose prednisone therapy along with his prescribed pain medicine I recommend follow-up with his primary care doctor for chronic pain management

## 2018-05-11 NOTE — Assessment & Plan Note (Signed)
I have a long discussion with the patient and his wife With evidence of bone metastasis, he has stage IV disease We discussed the role of palliative systemic treatment versus radiation therapy He is not willing to consider immunotherapy or chemotherapy I will see him again in a few months for further follow-up, after completion of radiation treatment

## 2018-05-12 ENCOUNTER — Ambulatory Visit (HOSPITAL_COMMUNITY)
Admission: RE | Admit: 2018-05-12 | Discharge: 2018-05-12 | Disposition: A | Discharge status: Home / Self Care | Payer: Medicare Other | Source: Ambulatory Visit | Attending: Hematology and Oncology | Admitting: Hematology and Oncology

## 2018-05-12 ENCOUNTER — Telehealth: Payer: Self-pay

## 2018-05-12 DIAGNOSIS — C3490 Malignant neoplasm of unspecified part of unspecified bronchus or lung: Secondary | ICD-10-CM | POA: Insufficient documentation

## 2018-05-12 MED ORDER — GADOBUTROL 1 MMOL/ML IV SOLN
7.5000 mL | Freq: Once | INTRAVENOUS | Status: AC | PRN
  Administered 2018-05-12: 7.5 mL via INTRAVENOUS

## 2018-05-12 NOTE — Telephone Encounter (Signed)
-----   Message from Heath Lark, MD sent at 05/12/2018  9:55 AM EST ----- Regarding: MRI neg Pls call patient/wife MRI is neg Will refer him back to Dr. Isidore Moos for radiation to his chest wall

## 2018-05-12 NOTE — Telephone Encounter (Signed)
Called and given below message. He verbalized understanding. 

## 2018-05-13 NOTE — Progress Notes (Signed)
Histology and Location of Primary Cancer:  01/15/18 Malignant neoplasm of upper lobe of left lung (HCC) - Clinical: Stage IA2 (cT1b, cN0, cM0)  Sites of Visceral and Bony Metastatic Disease: Left sixth rib only visible on PET scan.   Location(s) of Symptomatic Metastases:  new metastatic lesion in the anteromedial LEFT sixth rib with intense metabolic activity.  Past/Anticipated chemotherapy by medical oncology, if any:  Dr. Alvy Bimler 05/11/18 ASSESSMENT & PLAN:  Lung cancer ([place]) I have reviewed his recent repeat PET CT scan Unfortunately, he has new rib lesion He is symptomatic from it We discussed the role of immunotherapy versus palliative radiation treatment The patient is not keen to be treated with systemic treatment We discussed the risk, benefits, side effects of pembrolizumab but he is not willing to proceed We are waiting for MRI imaging of his brain I will call him and his wife test results If MRI show evidence of brain metastasis, he will undergo palliative radiation therapy to his brain and rib cage However, if MRI of the brain showed no evidence of brain metastasis, he will undergo palliative radiation therapy to the rib cage only After completion of radiation treatment, I plan to wait 6 weeks and then repeat PET CT scan and see him back He would like to reserve immunotherapy as his last option   Pain on a scale of 0-10 is: He tells me that the pain comes and goes. The pain becomes worse with activity.    If Spine Met(s), symptoms, if any, include:  Bowel/Bladder retention or incontinence (please describe): N/A  Numbness or weakness in extremities (please describe): N/A  Current Decadron regimen, if applicable: No  Ambulatory status? Walker? Wheelchair?: He is ambulatory.   SAFETY ISSUES: Prior radiation? Yes, Radiation treatment dates:   02/24/2018, 02/26/2018, 03/01/2018 Site/dose:   Left Lung // 54 Gy in 3 fractions 10/25/2012 - 12/10/2012 Radiation Therapy      IMRT Trevor Griffin). Right Tongue/ Bilateral Neck /// Total Dose, 60 Gy in 30 Fractions     Pacemaker/ICD? No  Possible current pregnancy? No  Is the patient on methotrexate? No  Current Complaints / other details:  He reports some shortness of breath, trouble catching his breath at times. He has a raised area next to his left eye. The area is red. Trevor Mealy PA prescribed an antibiotic for this area today, during his visit with him.  He saw Trevor Griffin today regarding swelling to his right throat. He felt like it might have been a reaction from prednisone.    BP (!) 144/72   Pulse 80   Temp (!) 97.5 F (36.4 C) (Oral)   SpO2 100% Comment: room air   Wt Readings from Last 3 Encounters:  05/18/18 168 lb 3.2 oz (76.3 kg)  05/11/18 164 lb 9.6 oz (74.7 kg)  03/01/18 162 lb (73.5 kg)

## 2018-05-17 ENCOUNTER — Telehealth: Payer: Self-pay

## 2018-05-17 NOTE — Telephone Encounter (Signed)
Jaw swelling is not expected problem with prednisone He needs to make appointment to see his dentist

## 2018-05-17 NOTE — Telephone Encounter (Signed)
Called and given below message. He verbalized understanding. He has dentures and no teeth. Given appt with Sandi Mealy, PA for tomorrow at 1130. He verbalized understanding.

## 2018-05-17 NOTE — Telephone Encounter (Signed)
Called with below message. His appetite is good. He is having a hard time swallowing. Especially when he eats. Both of his jaws are swollen, this started yesterday.

## 2018-05-17 NOTE — Telephone Encounter (Signed)
-----   Message from Heath Lark, MD sent at 05/17/2018  9:07 AM EST ----- Regarding: pain Can you call him/wife and ask how is he feeling with the prednisone therapy?

## 2018-05-18 ENCOUNTER — Ambulatory Visit
Admission: RE | Admit: 2018-05-18 | Discharge: 2018-05-18 | Disposition: A | Discharge status: Home / Self Care | Payer: Medicare Other | Source: Ambulatory Visit | Attending: Radiation Oncology | Admitting: Radiation Oncology

## 2018-05-18 ENCOUNTER — Inpatient Hospital Stay (HOSPITAL_BASED_OUTPATIENT_CLINIC_OR_DEPARTMENT_OTHER): Payer: Medicare Other | Admitting: Medical

## 2018-05-18 ENCOUNTER — Encounter: Payer: Self-pay | Admitting: Radiation Oncology

## 2018-05-18 ENCOUNTER — Other Ambulatory Visit: Payer: Self-pay

## 2018-05-18 ENCOUNTER — Telehealth: Payer: Self-pay | Admitting: Internal Medicine

## 2018-05-18 VITALS — BP 144/72 | HR 80 | Temp 97.5°F | Resp 18 | Ht 68.0 in | Wt 168.2 lb

## 2018-05-18 VITALS — BP 144/72 | HR 80 | Temp 97.5°F

## 2018-05-18 DIAGNOSIS — C7951 Secondary malignant neoplasm of bone: Secondary | ICD-10-CM | POA: Diagnosis not present

## 2018-05-18 DIAGNOSIS — Z809 Family history of malignant neoplasm, unspecified: Secondary | ICD-10-CM | POA: Diagnosis not present

## 2018-05-18 DIAGNOSIS — E785 Hyperlipidemia, unspecified: Secondary | ICD-10-CM | POA: Insufficient documentation

## 2018-05-18 DIAGNOSIS — Z87891 Personal history of nicotine dependence: Secondary | ICD-10-CM | POA: Insufficient documentation

## 2018-05-18 DIAGNOSIS — R05 Cough: Secondary | ICD-10-CM | POA: Diagnosis not present

## 2018-05-18 DIAGNOSIS — K219 Gastro-esophageal reflux disease without esophagitis: Secondary | ICD-10-CM | POA: Diagnosis not present

## 2018-05-18 DIAGNOSIS — E669 Obesity, unspecified: Secondary | ICD-10-CM | POA: Insufficient documentation

## 2018-05-18 DIAGNOSIS — C023 Malignant neoplasm of anterior two-thirds of tongue, part unspecified: Secondary | ICD-10-CM

## 2018-05-18 DIAGNOSIS — Z7982 Long term (current) use of aspirin: Secondary | ICD-10-CM | POA: Insufficient documentation

## 2018-05-18 DIAGNOSIS — Z85818 Personal history of malignant neoplasm of other sites of lip, oral cavity, and pharynx: Secondary | ICD-10-CM | POA: Diagnosis not present

## 2018-05-18 DIAGNOSIS — F329 Major depressive disorder, single episode, unspecified: Secondary | ICD-10-CM | POA: Insufficient documentation

## 2018-05-18 DIAGNOSIS — Z510 Encounter for antineoplastic radiation therapy: Secondary | ICD-10-CM | POA: Insufficient documentation

## 2018-05-18 DIAGNOSIS — R220 Localized swelling, mass and lump, head: Secondary | ICD-10-CM | POA: Diagnosis not present

## 2018-05-18 DIAGNOSIS — J069 Acute upper respiratory infection, unspecified: Secondary | ICD-10-CM

## 2018-05-18 DIAGNOSIS — Z923 Personal history of irradiation: Secondary | ICD-10-CM | POA: Diagnosis not present

## 2018-05-18 DIAGNOSIS — J449 Chronic obstructive pulmonary disease, unspecified: Secondary | ICD-10-CM | POA: Diagnosis not present

## 2018-05-18 DIAGNOSIS — R0789 Other chest pain: Secondary | ICD-10-CM | POA: Insufficient documentation

## 2018-05-18 DIAGNOSIS — F1010 Alcohol abuse, uncomplicated: Secondary | ICD-10-CM | POA: Insufficient documentation

## 2018-05-18 DIAGNOSIS — Z85118 Personal history of other malignant neoplasm of bronchus and lung: Secondary | ICD-10-CM | POA: Diagnosis not present

## 2018-05-18 DIAGNOSIS — J029 Acute pharyngitis, unspecified: Secondary | ICD-10-CM | POA: Diagnosis not present

## 2018-05-18 DIAGNOSIS — C3412 Malignant neoplasm of upper lobe, left bronchus or lung: Secondary | ICD-10-CM | POA: Diagnosis not present

## 2018-05-18 DIAGNOSIS — F419 Anxiety disorder, unspecified: Secondary | ICD-10-CM | POA: Diagnosis not present

## 2018-05-18 DIAGNOSIS — I252 Old myocardial infarction: Secondary | ICD-10-CM | POA: Insufficient documentation

## 2018-05-18 DIAGNOSIS — C01 Malignant neoplasm of base of tongue: Secondary | ICD-10-CM | POA: Insufficient documentation

## 2018-05-18 DIAGNOSIS — Z79899 Other long term (current) drug therapy: Secondary | ICD-10-CM | POA: Diagnosis not present

## 2018-05-18 DIAGNOSIS — I2510 Atherosclerotic heart disease of native coronary artery without angina pectoris: Secondary | ICD-10-CM | POA: Diagnosis not present

## 2018-05-18 DIAGNOSIS — I10 Essential (primary) hypertension: Secondary | ICD-10-CM | POA: Insufficient documentation

## 2018-05-18 DIAGNOSIS — R5383 Other fatigue: Secondary | ICD-10-CM | POA: Insufficient documentation

## 2018-05-18 DIAGNOSIS — Z85810 Personal history of malignant neoplasm of tongue: Secondary | ICD-10-CM | POA: Diagnosis not present

## 2018-05-18 DIAGNOSIS — G893 Neoplasm related pain (acute) (chronic): Secondary | ICD-10-CM | POA: Diagnosis not present

## 2018-05-18 MED ORDER — AMOXICILLIN-POT CLAVULANATE 600-42.9 MG/5ML PO SUSR: 600.0000 mg | Freq: Two times a day (BID) | ORAL | 0 refills | Status: DC

## 2018-05-18 NOTE — Progress Notes (Signed)
Radiation Oncology         [phone] ________________________________  Outpatient Re-Consultation  Name: Trevor Griffin MRN: [id number]  Date: 05/18/2018  DOB: 1955/09/15  IR:SWNIO, Trevor Right, MD  Trevor Lark, MD   REFERRING PHYSICIAN: Heath Lark, MD  DIAGNOSIS:    ICD-10-CM   1. 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 He presented for follow up PET scan on 04/26/2018, which showed increased metabolic activity of the anterior left sixth rib lesion from the last PET scan on 12/17/2017. He last saw Dr. Alvy Bimler on 05/11/2018 and declined systemic therapy, wanting to reserve it as his last option.  Brain MRI performed on 05/12/2018 was negative for metastatic disease. I personally reviewed his images.  He reports left-sided chest pain in the anterior rib that worsens with bending over and lying down. He also reports shortness of breath, trouble catching his breath at times. He also notes he saw Trevor Mealy, PA-C earlier today for throat swelling and facial skin lesion. Started on ABX.  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  PREVIOUS RADIATION THERAPY: Yes  10/23, 10/25, 03/01/2018: Left Lung, SBRT, 54 Gy in 3 fractions  10/25/2012 - 12/10/2012: Griffin Tongue/ Bilateral Neck, IMRT, 60 Gy in 30 fractions   PAST MEDICAL HISTORY:  has a past medical history of Acute ST elevation myocardial infarction (STEMI) involving left anterior descending (LAD) coronary artery ([place]) (2008 and 2009), Alcohol abuse, Anxiety, CAD S/P percutaneous coronary angioplasty (2008), Chronic pain, COPD (chronic obstructive pulmonary disease) ([place]), Depression,  Fatigue, GERD (gastroesophageal reflux disease), Headache(784.0), radiation therapy (10/25/12-12/10/12), Hyperlipidemia, Hypertension, Ischemic cardiomyopathy, Lung cancer ([place]) (dx'd 2015), Obesity, Tobacco abuse, Tongue cancer ([place]) (dx'd 2703), and Umbilical hernia.    PAST SURGICAL HISTORY: Past Surgical History:  Procedure Laterality Date  . COLONOSCOPY N/A 11/25/2016   Procedure: COLONOSCOPY;  Surgeon: Clarene Essex, MD;  Location: [facility];  Service: Endoscopy;  Laterality: N/A;  . CORONARY ANGIOPLASTY WITH STENT PLACEMENT  2008-2012   a. 5/'08: Anterior STEMI: Proximal-mid LAD PCI: 4 total Taxus DES stents 3.0 mm x  16 mm, 16 mm, 24 mm and 28 mm.( post-dil 3.5 mm);; b. 2/'09: PTCA LAD in-stent thrombosis - 3.8 mm;; c. 10/'12: mCx Promus Element DES 3.0 x 20 mm- post-dil - 3.25 mm. -> patent in 2016  . ESOPHAGOGASTRODUODENOSCOPY (EGD) WITH PROPOFOL N/A 11/25/2016   Procedure: ESOPHAGOGASTRODUODENOSCOPY (EGD) WITH PROPOFOL;  Surgeon: Clarene Essex, MD;  Location: [facility];  Service: Endoscopy;  Laterality: N/A;  neonatal scope  . FRACTURE SURGERY Griffin    as a child,leg  . FUDUCIAL PLACEMENT N/A 01/15/2018   Procedure: PLACEMENT OF FUDUCIAL;  Surgeon: Melrose Nakayama, MD;  Location: [place];  Service: Thoracic;  Laterality: N/A;  . LEFT HEART CATH AND CORONARY ANGIOGRAPHY  2006-2012   a) 2006: minimal CAD, normal EF;; b) 5/'08 - Ant STEMI - 100% p-mLAD (DES x 4 in LAD);; c)2/'09: Ant STEMI for IST/thrombosis of LAD ->PTCA, 80% mCx;; 10/'12: mCx 80-90% - DES PCI, patent LAD stents, EF ~35% - anterolateral AK, apical severe HK.  Marland Kitchen LEFT HEART CATHETERIZATION WITH CORONARY ANGIOGRAM N/A 06/21/2014   Procedure:  LEFT HEART CATHETERIZATION WITH CORONARY ANGIOGRAM;  Surgeon: Burnell Blanks, MD;  Location: [facility] CATH LAB: No evidence of obstructive disease.  Patent LAD and circumflex stents.  EF 40-45%.  . LOBECTOMY Griffin 06/06/2013   Procedure: LOBECTOMY;  Surgeon: Melrose Nakayama, MD;   Location: [place];  Service: Thoracic;  Laterality: Griffin;  . TRANSTHORACIC ECHOCARDIOGRAM  02/2014   EF 45-50%.  Akinesis of the mid apical anteroseptal wall.  GR 1 DD.  Mild MR.  . VIDEO ASSISTED THORACOSCOPY (VATS)/WEDGE RESECTION Griffin 06/06/2013   Procedure: VIDEO ASSISTED THORACOSCOPY (VATS)/WEDGE RESECTION;  Surgeon: Melrose Nakayama, MD;  Location: [place];  Service: Thoracic;  Laterality: [place];  Marland Kitchen VIDEO BRONCHOSCOPY WITH ENDOBRONCHIAL NAVIGATION N/A 01/15/2018   Procedure: VIDEO BRONCHOSCOPY WITH ENDOBRONCHIAL NAVIGATION;  Surgeon: Melrose Nakayama, MD;  Location: [place];  Service: Thoracic;  Laterality: N/A;    FAMILY HISTORY: family history includes Cancer in his sister; Heart attack in his brother; Kidney disease in his mother; Liver disease in his sister.  SOCIAL HISTORY:  reports that he has quit smoking. His smoking use included cigars. He quit after 46.00 years of use. He has never used smokeless tobacco. He reports current alcohol use of about 12.0 standard drinks of alcohol per week. He reports that he does not use drugs.  ALLERGIES: Hydromorphone and Nubain [nalbuphine hcl]  MEDICATIONS:  Current Outpatient Medications  Medication Sig Dispense Refill  . amoxicillin-clavulanate (AUGMENTIN ES-600) 600-42.9 MG/5ML suspension Take 5 mLs (600 mg total) by mouth 2 (two) times daily. 75 mL 0  . aspirin 81 MG tablet Take 1 tablet (81 mg total) by mouth daily. 30 tablet 1  . B Complex-C (B-COMPLEX WITH VITAMIN C) tablet Take 1 tablet daily by mouth. 30 tablet 0  . cyclobenzaprine (FLEXERIL) 5 MG tablet TAKE 1 TABLET BY MOUTH 2 TIMES DAILY AS NEEDED FOR MUSCLE SPASMS 60 tablet 3  . esomeprazole (NEXIUM) 20 MG capsule Take 20 mg by mouth daily.     . Glycopyrrolate (LONHALA MAGNAIR REFILL KIT) 25 MCG/ML SOLN Inhale 1 Act into the lungs 2 (two) times daily. 60 mL 11  . levothyroxine (SYNTHROID, LEVOTHROID) 175 MCG tablet Take 1 tablet (175 mcg total) by mouth daily before breakfast.  90 tablet 1  . metoprolol tartrate (LOPRESSOR) 25 MG tablet Take 0.5 tablets (12.5 mg total) by mouth 2 (two) times daily. (Patient taking differently: Take 12.5 mg by mouth See admin instructions. Take 12.72m by mouth once daily, may take additional 12.578mif blood pressure is above 140) 180 tablet 1  . nicotine (NICODERM CQ - DOSED IN MG/24 HOURS) 14 mg/24hr patch Apply 1460match daily x 6 wk, then 7 mg patch daily x 2 wk 14 patch 2  . nicotine (NICODERM CQ - DOSED IN MG/24 HR) 7 mg/24hr patch Apply 50m68mtch daily x 6 wk, then 7 mg patch daily x 2 wk 14 patch 0  . nitroGLYCERIN (NITROSTAT) 0.4 MG SL tablet Place 1 tablet (0.4 mg total) under the tongue every 5 (five) minutes as needed (up to 3 doses). For chest pain 25 tablet 3  . oxyCODONE (OXY IR/ROXICODONE) 5 MG immediate release tablet Take 1 tablet (5 mg total) by mouth every 6 (six) hours as needed for severe pain. 100 tablet 0  . predniSONE (DELTASONE) 20 MG tablet Take 1 tablet (20 mg total) by mouth daily with breakfast. 14 tablet 0  . dronabinol (MARINOL) 2.5 MG capsule Take 1 capsule (2.5 mg total) by mouth 2 (two)  times daily before lunch and supper. (Patient not taking: Reported on 02/09/2018) 60 capsule 0  . feeding supplement, ENSURE ENLIVE, (ENSURE ENLIVE) LIQD Take 237 mLs by mouth 4 (four) times daily. (Patient not taking: Reported on 02/09/2018) 120 Bottle 11   No current facility-administered medications for this encounter.     REVIEW OF SYSTEMS:  Notable for that above.   PHYSICAL EXAM:  oral temperature is 97.5 F (36.4 C) (abnormal). His blood pressure is 144/72 (abnormal) and his pulse is 80. His oxygen saturation is 100%.   General: Alert and oriented, in no acute distress HEENT: Head is normocephalic. Extraocular movements are intact. Oropharynx is clear. Oral cavity shows moist mucosa. There is a subtle white film over the Griffin floor of mouth, which does not appear obvious for thrush. Upper dentures were not removed.  He has an erythematous soft subcutaneous raised lesion on the upper left cheek, which has a somewhat pearly appearance. Neck: Neck is supple, no palpable cervical or supraclavicular lymphadenopathy. No palpable adenopathy. Post-surgical fibrosis. Heart: Regular in rate and rhythm with no murmurs, rubs, or gallops. Chest: Clear to auscultation bilaterally, with no rhonchi, wheezes, or rales.  Abdomen: Soft, nontender, nondistended, with no rigidity or guarding. Extremities: No cyanosis or edema. Lymphatics: see Neck Exam Skin: No concerning lesions. Musculoskeletal: symmetric strength and muscle tone throughout.Tenderness to the left lateral rib cage. Neurologic: Cranial nerves II through XII are grossly intact. No obvious focalities. Speech is fluent. Coordination is intact. Psychiatric: Judgment and insight are intact. Affect is appropriate.   ECOG = 1  0 - Asymptomatic (Fully active, able to carry on all predisease activities without restriction)  1 - Symptomatic but completely ambulatory (Restricted in physically strenuous activity but ambulatory and able to carry out work of a light or sedentary nature. For example, light housework, office work)  2 - Symptomatic, <50% in bed during the day (Ambulatory and capable of all self care but unable to carry out any work activities. Up and about more than 50% of waking hours)  3 - Symptomatic, >50% in bed, but not bedbound (Capable of only limited self-care, confined to bed or chair 50% or more of waking hours)  4 - Bedbound (Completely disabled. Cannot carry on any self-care. Totally confined to bed or chair)  5 - Death   Eustace Pen MM, Creech RH, Tormey DC, et al. [phone]). "Toxicity and response criteria of the [facility]". Center Oncol. 5 (6): 649-55   LABORATORY DATA:  Lab Results  Component Value Date   WBC 4.4 04/26/2018   HGB 14.1 04/26/2018   HCT 42.7 04/26/2018   MCV 98.4 04/26/2018   PLT 177 04/26/2018    CMP     Component Value Date/Time   NA 136 04/26/2018 1319   NA 137 03/04/2017 1509   NA 135 (L) 01/12/2017 0845   K 4.4 04/26/2018 1319   K 4.7 01/12/2017 0845   CL 100 04/26/2018 1319   CO2 28 04/26/2018 1319   CO2 24 01/12/2017 0845   GLUCOSE 91 04/26/2018 1319   GLUCOSE 91 01/12/2017 0845   BUN 11 04/26/2018 1319   BUN 10 03/04/2017 1509   BUN 8.0 01/12/2017 0845   CREATININE 0.95 04/26/2018 1319   CREATININE 0.9 01/12/2017 0845   CALCIUM 9.5 04/26/2018 1319   CALCIUM 9.7 01/12/2017 0845   PROT 7.4 04/26/2018 1319   PROT 7.1 03/04/2017 1509   PROT 7.7 01/12/2017 0845   ALBUMIN 3.8 04/26/2018 1319  ALBUMIN 4.2 03/04/2017 1509   ALBUMIN 3.6 01/12/2017 0845   AST 17 04/26/2018 1319   AST 21 01/12/2017 0845   ALT 14 04/26/2018 1319   ALT 16 01/12/2017 0845   ALKPHOS 97 04/26/2018 1319   ALKPHOS 94 01/12/2017 0845   BILITOT 0.7 04/26/2018 1319   BILITOT 0.9 03/04/2017 1509   BILITOT 0.41 01/12/2017 0845   GFRNONAA >60 04/26/2018 1319   GFRAA >60 04/26/2018 1319         RADIOGRAPHY: Mr Brain W Wo Contrast  Result Date: 05/12/2018 CLINICAL DATA:  New diagnosis small cell lung cancer.  Staging. EXAM: MRI HEAD WITHOUT AND WITH CONTRAST TECHNIQUE: Multiplanar, multiecho pulse sequences of the brain and surrounding structures were obtained without and with intravenous contrast. CONTRAST:  7.5 cc Gadavist COMPARISON:  None. FINDINGS: Brain: Volume loss somewhat advanced for age. Diffusion imaging does not show any acute or subacute infarction. There chronic small-vessel ischemic changes affecting the pons and affecting the cerebral hemispheric white matter. No cortical or large vessel territory infarction. No evidence primary or metastatic mass lesion, hemorrhage, hydrocephalus or extra-axial collection. Vascular: Major vessels at the base of the brain show flow. Skull and upper cervical spine: Negative Sinuses/Orbits: Sinuses are clear. Orbits are negative. Bilateral mastoid  effusions. Other: None IMPRESSION: No evidence of metastatic disease. Generalized brain volume loss advanced for age. Chronic small-vessel ischemic changes. Bilateral mastoid effusions. Electronically Signed   By: Nelson Chimes M.D.   On: 05/12/2018 08:43   Nm Pet Image Restag (ps) Skull Base To Thigh  Result Date: 04/26/2018 CLINICAL DATA:  Subsequent treatment strategy for non-small cell lung cancer. EXAM: NUCLEAR MEDICINE PET SKULL BASE TO THIGH TECHNIQUE: 8.2 mCi F-18 FDG was injected intravenously. Full-ring PET imaging was performed from the skull base to thigh after the radiotracer. CT data was obtained and used for attenuation correction and anatomic localization. Fasting blood glucose: 90 mg/dl COMPARISON:  None. FINDINGS: Mediastinal blood pool activity: SUV max 1.9 NECK: None Incidental CT findings: none CHEST: Cavitary lesion in the LEFT upper lobe is decreased in size measuring 11 x 10 mm compared with 14 x 16 mm. Metabolic activity is lesions also decreased with SUV max equal 2.5 decreased from 6.9. No new hypermetabolic pulmonary nodules. Mildly hypermetabolic small Griffin axial lymph node with SUV max equal 2.3 is unchanged from comparison exam. Similar findings in the LEFT axilla. These are favored benign reactive nodes. There is however increased metabolic activity of the anterior LEFT sixth rib lesion with SUV max equal 12.7 increased from 3.1. Additionally there is a soft tissue component of this lesion now expanding the bone measuring 2.6 x 1.5 cm. Incidental CT findings: none ABDOMEN/PELVIS: No abnormal hypermetabolic activity within the liver, pancreas, adrenal glands, or spleen. No hypermetabolic lymph nodes in the abdomen or pelvis. Incidental CT findings: Moderate Griffin hydrocele unchanged. Cholelithiasis without cholecystitis. SKELETON: Metastatic disease in the anterior LEFT sixth rib described in the chest section. No additional evidence skeletal metastasis. Incidental CT findings:  none IMPRESSION: 1. Unfortunately, there is a new metastatic lesion in the anteromedial LEFT sixth rib with intense metabolic activity. 2. Interval decrease in size and metabolic activity of LEFT upper lobe cavitary nodule 3. No evidence additional  metastatic disease. Electronically Signed   By: Suzy Bouchard M.D.   On: 04/26/2018 15:30      IMPRESSION/PLAN: Lung cancer with painful bony metastasis  We discussed various palliative courses of radiotherapy for his disease.  One option would be 6 weeks of  RT to the rib metastasis which could provide better local control for his oligometastasic disease but would be unlikely to affect his longevity.  Other options are 1 or 2 week regimens.  Given his social stressors and transportation challenges, he and his SO prefer a 1 week hypo fractionated course.  I therefore recommend 1 week of radiation treatment to the left rib metastasis.  We discussed the risks, benefits, and side effects of radiotherapy. No guarantees of treatment were given. A consent form was signed and placed in the patient's medical record. The patient is enthusiastic about proceeding with treatment. He is scheduled to proceed with CT simulation/planning following his appointment. I look forward to participating in the patient's care.  Lesion on face is infectious/cystic vs cancerous.  Favor infectious or cyst as it grew over 2 wks per patient.  Talk to PCP if it doesn't resolve in the next month, continue ABX  I spent 20 minutes face to face with the patient and more than 50% of that time was spent in counseling and/or coordination of care.   __________________________________________   [name], MD  This document serves as a record of services personally performed by [name], MD. It was created on her behalf by Wilburn Mylar, a trained medical scribe. The creation of this record is based on the scribe's personal observations and the provider's statements to them. This  document has been checked and approved by the attending provider.

## 2018-05-18 NOTE — Progress Notes (Signed)
Pt seen by PA Van only, no RN assessment at this time.  PA aware. 

## 2018-05-18 NOTE — Telephone Encounter (Signed)
Called patient and suggested that he check with his cancer doctor to see if they would be interested in prescribing and managing the Oxycodone prescription being that they are wanting to increase the dose (per Stef). Patient said that he would contact their office. If they are not able to, I asked the patient to call back and we would see what we could do to get him in for an appointment.

## 2018-05-18 NOTE — Telephone Encounter (Signed)
Pt is due for an appt.

## 2018-05-18 NOTE — Progress Notes (Signed)
  Radiation Oncology         [phone] ________________________________  Name: Trevor Griffin MRN: [id number]  Date: 05/18/2018  DOB: 04/19/1956  SIMULATION AND TREATMENT PLANNING NOTE Special Treatment Procedure Note  Outpatient  DIAGNOSIS:     ICD-10-CM   1. Metastasis to bone [facility]) C79.51     NARRATIVE:  The patient was brought to the [facility].  Identity was confirmed.  All relevant records and images related to the planned course of therapy were reviewed.  The patient freely provided informed written consent to proceed with treatment after reviewing the details related to the planned course of therapy. The consent form was witnessed and verified by the simulation staff.    Then, the patient was set-up in a stable reproducible  supine position for radiation therapy.  CT images were obtained.  Surface markings were placed.  The CT images were loaded into the planning software.    TREATMENT PLANNING NOTE: Treatment planning then occurred.  The radiation prescription was entered and confirmed.    A total of 2 medically necessary complex treatment devices were fabricated and supervised by me, in the form of 2 fields with MLCs to block heart and lungs. MORE FIELDS WITH MLCs MAY BE ADDED IN DOSIMETRY for dose homogeneity.  I have requested : 3D Simulation  I have requested a DVH of the following structures: target, heart, lungs.    The patient will receive 25 Gy in 5 fractions to the left 6th rib tumor  Special Treatment Procedure Note: The patient received prior radiotherapy close to his current fields. There could be some overlap of radiation dose.  Prior regional radiotherapy increases the risk of side effects from treatment. I have considered this in the treatment planning process and have aimed to minimize tissue overlap.  This increases the complexity of this patient's treatment and therefore this constitutes a special treatment  procedure.  -----------------------------------  [name], MD

## 2018-05-18 NOTE — Telephone Encounter (Signed)
Copied from [place] [phone]. Topic: Quick Communication - See Telephone Encounter >> May 18, 2018  3:17 PM Alfredia Ferguson R wrote: Patients wife called in and stated that Heath Lark, MD  would like his oxyCODONE (OXY IR/ROXICODONE) 5 MG immediate release tablet changed to 10mg  due to pain patient is in.   CB# [phone]

## 2018-05-19 ENCOUNTER — Encounter: Payer: Self-pay | Admitting: Radiation Oncology

## 2018-05-20 NOTE — Progress Notes (Signed)
[facility] Progress Note   Trevor Griffin [id number] [birth year] 63 y.o.  Trevor Griffin is managed by Dr. Heath Lark  Actively treated with chemotherapy/immunotherapy/hormonal therapy: Radiation therapy   Assessment: Plan:    Upper respiratory tract infection, unspecified type - Plan: amoxicillin-clavulanate (AUGMENTIN ES-600) 600-42.9 MG/5ML suspension  Sore throat - Plan: amoxicillin-clavulanate (AUGMENTIN ES-600) 600-42.9 MG/5ML suspension  Malignant neoplasm of upper lobe of left lung (HCC)   Sore throat and upper respiratory tract infection: The patient was given Augmentin suspension 600- 42.9 mg per 5 ml x7 days.  Metastatic malignant neoplasm of the left lung: The patient is pending start of radiation therapy.  Please see After Visit Summary for patient specific instructions.  Future Appointments  Date Time Provider [place]  05/27/2018 11:30 AM [name], MD [facility] None  05/28/2018 11:15 AM [facility] None  05/31/2018 12:20 PM [facility] None  06/01/2018 11:30 AM [facility] None  06/02/2018 12:15 PM [facility] None    No orders of the defined types were placed in this encounter.      Subjective:   Patient ID:  Trevor Griffin is a 63 y.o. (DOB [birth year]) male.  Chief Complaint:  Chief Complaint  Patient presents with  . Facial Swelling    HPI Trevor Griffin   is a 63 year old male with a history of a metastatic lung cancer with metastasis to the bones who is followed by Dr. Alvy Bimler.  He additionally has a history of a carcinoma of the tongue.  He is pending start of radiation therapy to his chest wall.  He presents to the clinic today with a report of a sore throat, productive cough with yellow sputum, and swelling of his bilateral face along his lower jaw.  He denies fevers, chills, sweats, nausea, or vomiting.  He has a difficult time  swallowing pills.  Medications: I have reviewed the patient's current medications.  Allergies:  Allergies  Allergen Reactions  . Hydromorphone Nausea Only and Other (See Comments)    Headaches also  . Nubain [Nalbuphine Hcl] Nausea Only and Other (See Comments)    Headaches also    Past Medical History:  Diagnosis Date  . Acute ST elevation myocardial infarction (STEMI) involving left anterior descending (LAD) coronary artery ([place]) 2008 and 2009   5/'08 - DES x 4 to p-m LAD; 2/'09 - PTCA of  in-stent thrombosis  . Alcohol abuse   . Anxiety   . CAD S/P percutaneous coronary angioplasty 2008   a. Ant STEMI 5/08 w/ 4 Taxus DES p-m LAD (3.0 x 16,16,24,28 mm) ;  b. 2/'09:  Stopped Plavix-> Ant STEMI/stent thrombosis in LAD (PTCA - 3.8 mm), LCX 24m/d;  c. 10/12: Cath/PCI: p-m LAD stents 10-20% ISR, mLAD 40%, CFX 30-40%, mCFX 90% (Promus Premier DES 3.0 mm x 20 mm - 3.33mm ), EF 35%; d. cath 2/17/'16 no Sig CAD, patent LAD & Cx stents, EF 40-45%  . Chronic pain   . COPD (chronic obstructive pulmonary disease) ([place])   . Depression   . Fatigue    Sleep study 6/08 was negative. Sleep study 5/13 showed no OSA.  Marland Kitchen GERD (gastroesophageal reflux disease)   . Headache(784.0)    migraines  . Hx of radiation therapy 10/25/12-12/10/12    Right Tongue/ Bilateral Neck //Total Dose, 60 Gy in 30 Fractions  . Hyperlipidemia   . Hypertension   . Ischemic cardiomyopathy    a. Echo  08/20/11 with EF 50-55% (previously 35%);  b. 02/2014 Echo: EF 45-50-%, mid-apicalanteroseptal AK, Gr 1 dd, mild AI, midlly dil LA.  . Lung cancer ([place]) dx'd 2015  . Obesity   . Tobacco abuse   . Tongue cancer ([place]) dx'd 2014   s/p resection - [facility]  . Umbilical hernia     Past Surgical History:  Procedure Laterality Date  . COLONOSCOPY N/A 11/25/2016   Procedure: COLONOSCOPY;  Surgeon: Clarene Essex, MD;  Location: [facility];  Service: Endoscopy;  Laterality: N/A;  . CORONARY ANGIOPLASTY WITH STENT PLACEMENT  2008-2012     a. 5/'08: Anterior STEMI: Proximal-mid LAD PCI: 4 total Taxus DES stents 3.0 mm x  16 mm, 16 mm, 24 mm and 28 mm.( post-dil 3.5 mm);; b. 2/'09: PTCA LAD in-stent thrombosis - 3.8 mm;; c. 10/'12: mCx Promus Element DES 3.0 x 20 mm- post-dil - 3.25 mm. -> patent in 2016  . ESOPHAGOGASTRODUODENOSCOPY (EGD) WITH PROPOFOL N/A 11/25/2016   Procedure: ESOPHAGOGASTRODUODENOSCOPY (EGD) WITH PROPOFOL;  Surgeon: Clarene Essex, MD;  Location: [facility];  Service: Endoscopy;  Laterality: N/A;  neonatal scope  . FRACTURE SURGERY Right    as a child,leg  . FUDUCIAL PLACEMENT N/A 01/15/2018   Procedure: PLACEMENT OF FUDUCIAL;  Surgeon: Melrose Nakayama, MD;  Location: [place];  Service: Thoracic;  Laterality: N/A;  . LEFT HEART CATH AND CORONARY ANGIOGRAPHY  2006-2012   a) 2006: minimal CAD, normal EF;; b) 5/'08 - Ant STEMI - 100% p-mLAD (DES x 4 in LAD);; c)2/'09: Ant STEMI for IST/thrombosis of LAD ->PTCA, 80% mCx;; 10/'12: mCx 80-90% - DES PCI, patent LAD stents, EF ~35% - anterolateral AK, apical severe HK.  Marland Kitchen LEFT HEART CATHETERIZATION WITH CORONARY ANGIOGRAM N/A 06/21/2014   Procedure: LEFT HEART CATHETERIZATION WITH CORONARY ANGIOGRAM;  Surgeon: Burnell Blanks, MD;  Location: [facility] CATH LAB: No evidence of obstructive disease.  Patent LAD and circumflex stents.  EF 40-45%.  . LOBECTOMY Right 06/06/2013   Procedure: LOBECTOMY;  Surgeon: Melrose Nakayama, MD;  Location: [place];  Service: Thoracic;  Laterality: Right;  . TRANSTHORACIC ECHOCARDIOGRAM  02/2014   EF 45-50%.  Akinesis of the mid apical anteroseptal wall.  GR 1 DD.  Mild MR.  . VIDEO ASSISTED THORACOSCOPY (VATS)/WEDGE RESECTION Right 06/06/2013   Procedure: VIDEO ASSISTED THORACOSCOPY (VATS)/WEDGE RESECTION;  Surgeon: Melrose Nakayama, MD;  Location: [place];  Service: Thoracic;  Laterality: Right;  Marland Kitchen VIDEO BRONCHOSCOPY WITH ENDOBRONCHIAL NAVIGATION N/A 01/15/2018   Procedure: VIDEO BRONCHOSCOPY WITH ENDOBRONCHIAL NAVIGATION;  Surgeon:  Melrose Nakayama, MD;  Location[facility];  Service: Thoracic;  Laterality: N/A;    Family History  Problem Relation Age of Onset  . Kidney disease Mother        CHRONIC  . Cancer Sister   . Liver disease Sister        LIVER FAILURE  . Heart attack Brother     Social History   Socioeconomic History  . Marital status: Married    Spouse name: Not on file  . Number of children: Not on file  . Years of education: Not on file  . Highest education level: Not on file  Occupational History  . Occupation: Disabled  Social Needs  . Financial resource strain: Not on file  . Food insecurity:    Worry: Not on file    Inability: Not on file  . Transportation needs:    Medical: Yes    Non-medical: No  Tobacco Use  . Smoking  status: Former Smoker    Years: 46.00    Types: Cigars  . Smokeless tobacco: Never Used  . Tobacco comment: He has quit smoking recently, he is using a patch  Substance and Sexual Activity  . Alcohol use: Yes    Alcohol/week: 12.0 standard drinks    Types: 12 Cans of beer per week  . Drug use: No  . Sexual activity: Yes  Lifestyle  . Physical activity:    Days per week: 7 days    Minutes per session: 20 min  . Stress: Not on file  Relationships  . Social connections:    Talks on phone: Not on file    Gets together: Not on file    Attends religious service: Not on file    Active member of club or organization: Not on file    Attends meetings of clubs or organizations: Not on file    Relationship status: Not on file  . Intimate partner violence:    Fear of current or ex partner: No    Emotionally abused: No    Physically abused: No    Forced sexual activity: No  Other Topics Concern  . Not on file  Social History Narrative   Alcoholic beverage:  Yes   Drug use:  No   Seatbead Use: Yes   Firearms in home:  Yes   Exercise:  No   Smoke Alarm in your home: Yes    Past Medical History, Surgical history, Social history, and Family history were  reviewed and updated as appropriate.   Please see review of systems for further details on the patient's review from today.   Review of Systems:  Review of Systems  Constitutional: Negative for chills, diaphoresis, fatigue and fever.  HENT: Positive for sore throat and trouble swallowing. Negative for congestion, postnasal drip and rhinorrhea.   Respiratory: Positive for cough. Negative for shortness of breath and wheezing.   Cardiovascular: Negative for palpitations.  Gastrointestinal: Negative for constipation, diarrhea, nausea and vomiting.  Neurological: Negative for headaches.    Objective:   Physical Exam:  BP (!) 144/72 (BP Location: Left Arm, Patient Position: Sitting) Comment: Notified Nurse of BP  Pulse 80   Temp (!) 97.5 F (36.4 C) (Axillary)   Resp 18   Ht 5\' 8"  (1.727 m)   Wt 168 lb 3.2 oz (76.3 kg)   SpO2 100%   BMI 25.57 kg/m  ECOG: 0  Physical Exam Constitutional:      General: He is not in acute distress.    Appearance: He is not diaphoretic.  HENT:     Head: Normocephalic and atraumatic.     Right Ear: External ear normal.     Left Ear: External ear normal.     Mouth/Throat:     Mouth: Mucous membranes are moist.     Pharynx: Posterior oropharyngeal erythema present. No oropharyngeal exudate.  Neck:     Musculoskeletal: Normal range of motion.     Comments: The patient has had an extensive bilateral neck dissection.  His skin is taunt with postsurgical changes in his anatomy.  No masses are noted. Cardiovascular:     Rate and Rhythm: Normal rate and regular rhythm.     Heart sounds: Normal heart sounds. No murmur. No friction rub. No gallop.   Pulmonary:     Effort: Pulmonary effort is normal. No respiratory distress.     Breath sounds: Normal breath sounds. No wheezing or rales.  Skin:    General: Skin  is warm and dry.     Findings: No erythema or rash.  Neurological:     Mental Status: He is alert.     Coordination: Coordination normal.    Psychiatric:        Behavior: Behavior normal.        Thought Content: Thought content normal.        Judgment: Judgment normal.     Lab Review:     Component Value Date/Time   NA 136 04/26/2018 1319   NA 137 03/04/2017 1509   NA 135 (L) 01/12/2017 0845   K 4.4 04/26/2018 1319   K 4.7 01/12/2017 0845   CL 100 04/26/2018 1319   CO2 28 04/26/2018 1319   CO2 24 01/12/2017 0845   GLUCOSE 91 04/26/2018 1319   GLUCOSE 91 01/12/2017 0845   BUN 11 04/26/2018 1319   BUN 10 03/04/2017 1509   BUN 8.0 01/12/2017 0845   CREATININE 0.95 04/26/2018 1319   CREATININE 0.9 01/12/2017 0845   CALCIUM 9.5 04/26/2018 1319   CALCIUM 9.7 01/12/2017 0845   PROT 7.4 04/26/2018 1319   PROT 7.1 03/04/2017 1509   PROT 7.7 01/12/2017 0845   ALBUMIN 3.8 04/26/2018 1319   ALBUMIN 4.2 03/04/2017 1509   ALBUMIN 3.6 01/12/2017 0845   AST 17 04/26/2018 1319   AST 21 01/12/2017 0845   ALT 14 04/26/2018 1319   ALT 16 01/12/2017 0845   ALKPHOS 97 04/26/2018 1319   ALKPHOS 94 01/12/2017 0845   BILITOT 0.7 04/26/2018 1319   BILITOT 0.9 03/04/2017 1509   BILITOT 0.41 01/12/2017 0845   GFRNONAA >60 04/26/2018 1319   GFRAA >60 04/26/2018 1319       Component Value Date/Time   WBC 4.4 04/26/2018 1319   RBC 4.34 04/26/2018 1319   HGB 14.1 04/26/2018 1319   HGB 10.4 (L) 03/04/2017 1509   HGB 11.4 (L) 01/12/2017 0845   HCT 42.7 04/26/2018 1319   HCT 33.6 (L) 03/04/2017 1509   HCT 38.2 (L) 01/12/2017 0845   PLT 177 04/26/2018 1319   PLT 225 03/04/2017 1509   MCV 98.4 04/26/2018 1319   MCV 81 03/04/2017 1509   MCV 85.1 01/12/2017 0845   MCH 32.5 04/26/2018 1319   MCHC 33.0 04/26/2018 1319   RDW 14.1 04/26/2018 1319   RDW 17.5 (H) 03/04/2017 1509   RDW 18.2 (H) 01/12/2017 0845   LYMPHSABS 0.6 (L) 04/26/2018 1319   LYMPHSABS 0.6 (L) 01/12/2017 0845   MONOABS 0.5 04/26/2018 1319   MONOABS 0.5 01/12/2017 0845   EOSABS 0.1 04/26/2018 1319   EOSABS 0.1 01/12/2017 0845   BASOSABS 0.0 04/26/2018 1319    BASOSABS 0.0 01/12/2017 0845   -------------------------------  Imaging from last 24 hours (if applicable):  Radiology interpretation: Mr Jeri Cos VE Contrast  Result Date: 05/12/2018 CLINICAL DATA:  New diagnosis small cell lung cancer.  Staging. EXAM: MRI HEAD WITHOUT AND WITH CONTRAST TECHNIQUE: Multiplanar, multiecho pulse sequences of the brain and surrounding structures were obtained without and with intravenous contrast. CONTRAST:  7.5 cc Gadavist COMPARISON:  None. FINDINGS: Brain: Volume loss somewhat advanced for age. Diffusion imaging does not show any acute or subacute infarction. There chronic small-vessel ischemic changes affecting the pons and affecting the cerebral hemispheric white matter. No cortical or large vessel territory infarction. No evidence primary or metastatic mass lesion, hemorrhage, hydrocephalus or extra-axial collection. Vascular: Major vessels at the base of the brain show flow. Skull and upper cervical spine: Negative Sinuses/Orbits: Sinuses are clear. Orbits  are negative. Bilateral mastoid effusions. Other: None IMPRESSION: No evidence of metastatic disease. Generalized brain volume loss advanced for age. Chronic small-vessel ischemic changes. Bilateral mastoid effusions. Electronically Signed   By: Nelson Chimes M.D.   On: 05/12/2018 08:43   Nm Pet Image Restag (ps) Skull Base To Thigh  Result Date: 04/26/2018 CLINICAL DATA:  Subsequent treatment strategy for non-small cell lung cancer. EXAM: NUCLEAR MEDICINE PET SKULL BASE TO THIGH TECHNIQUE: 8.2 mCi F-18 FDG was injected intravenously. Full-ring PET imaging was performed from the skull base to thigh after the radiotracer. CT data was obtained and used for attenuation correction and anatomic localization. Fasting blood glucose: 90 mg/dl COMPARISON:  None. FINDINGS: Mediastinal blood pool activity: SUV max 1.9 NECK: None Incidental CT findings: none CHEST: Cavitary lesion in the LEFT upper lobe is decreased in size  measuring 11 x 10 mm compared with 14 x 16 mm. Metabolic activity is lesions also decreased with SUV max equal 2.5 decreased from 6.9. No new hypermetabolic pulmonary nodules. Mildly hypermetabolic small RIGHT axial lymph node with SUV max equal 2.3 is unchanged from comparison exam. Similar findings in the LEFT axilla. These are favored benign reactive nodes. There is however increased metabolic activity of the anterior LEFT sixth rib lesion with SUV max equal 12.7 increased from 3.1. Additionally there is a soft tissue component of this lesion now expanding the bone measuring 2.6 x 1.5 cm. Incidental CT findings: none ABDOMEN/PELVIS: No abnormal hypermetabolic activity within the liver, pancreas, adrenal glands, or spleen. No hypermetabolic lymph nodes in the abdomen or pelvis. Incidental CT findings: Moderate RIGHT hydrocele unchanged. Cholelithiasis without cholecystitis. SKELETON: Metastatic disease in the anterior LEFT sixth rib described in the chest section. No additional evidence skeletal metastasis. Incidental CT findings: none IMPRESSION: 1. Unfortunately, there is a new metastatic lesion in the anteromedial LEFT sixth rib with intense metabolic activity. 2. Interval decrease in size and metabolic activity of LEFT upper lobe cavitary nodule 3. No evidence additional  metastatic disease. Electronically Signed   By: Suzy Bouchard M.D.   On: 04/26/2018 15:30

## 2018-05-24 DIAGNOSIS — Z510 Encounter for antineoplastic radiation therapy: Secondary | ICD-10-CM | POA: Diagnosis not present

## 2018-05-24 DIAGNOSIS — C7951 Secondary malignant neoplasm of bone: Secondary | ICD-10-CM | POA: Diagnosis not present

## 2018-05-27 ENCOUNTER — Ambulatory Visit
Admission: RE | Admit: 2018-05-27 | Discharge: 2018-05-27 | Disposition: A | Discharge status: Home / Self Care | Payer: Medicare Other | Source: Ambulatory Visit | Attending: Radiation Oncology | Admitting: Radiation Oncology

## 2018-05-27 ENCOUNTER — Inpatient Hospital Stay: Payer: Medicare Other | Admitting: *Deleted

## 2018-05-27 ENCOUNTER — Telehealth: Payer: Self-pay

## 2018-05-27 DIAGNOSIS — Z510 Encounter for antineoplastic radiation therapy: Secondary | ICD-10-CM | POA: Diagnosis not present

## 2018-05-27 DIAGNOSIS — C3412 Malignant neoplasm of upper lobe, left bronchus or lung: Secondary | ICD-10-CM

## 2018-05-27 DIAGNOSIS — C7951 Secondary malignant neoplasm of bone: Secondary | ICD-10-CM | POA: Diagnosis not present

## 2018-05-27 NOTE — Telephone Encounter (Signed)
Called and given below message. He verbalized understanding. 

## 2018-05-27 NOTE — Telephone Encounter (Signed)
Wife called and left a message asking Dr. Alvy Bimler to order oxygen to be used at night only. He gets short of breath and especially at night.

## 2018-05-27 NOTE — Progress Notes (Signed)
[name]  Clinical Social Work met with patient in [place] regarding financial community resources. Mr. Trevor Griffin reported he was back in treatment because they found "another spot".  The patient shared his frustration due to the cancer continuing to recur.  CSW provided active listening and validated patient's feelings of frustration. Patient to complete application for [employer] [facility] initiative for free gas card.   Gwinda Maine, LCSW  Clinical Social Worker [facility]

## 2018-05-27 NOTE — Telephone Encounter (Signed)
I only order oxygen for terminal patient dying; if he agrees no treatment and no scans and hospice, I will order it Otherwise, he needs to see his lung doctor to get oxygen order

## 2018-05-28 ENCOUNTER — Ambulatory Visit
Admission: RE | Admit: 2018-05-28 | Discharge: 2018-05-28 | Disposition: A | Discharge status: Home / Self Care | Payer: Medicare Other | Source: Ambulatory Visit | Attending: Radiation Oncology | Admitting: Radiation Oncology

## 2018-05-28 DIAGNOSIS — C7951 Secondary malignant neoplasm of bone: Secondary | ICD-10-CM | POA: Diagnosis not present

## 2018-05-28 DIAGNOSIS — Z510 Encounter for antineoplastic radiation therapy: Secondary | ICD-10-CM | POA: Diagnosis not present

## 2018-05-31 ENCOUNTER — Ambulatory Visit
Admission: RE | Admit: 2018-05-31 | Discharge: 2018-05-31 | Disposition: A | Discharge status: Home / Self Care | Payer: Medicare Other | Source: Ambulatory Visit | Attending: Radiation Oncology | Admitting: Radiation Oncology

## 2018-05-31 DIAGNOSIS — C7951 Secondary malignant neoplasm of bone: Secondary | ICD-10-CM | POA: Diagnosis not present

## 2018-05-31 DIAGNOSIS — Z510 Encounter for antineoplastic radiation therapy: Secondary | ICD-10-CM | POA: Diagnosis not present

## 2018-06-01 ENCOUNTER — Ambulatory Visit
Admission: RE | Admit: 2018-06-01 | Discharge: 2018-06-01 | Disposition: A | Discharge status: Home / Self Care | Payer: Medicare Other | Source: Ambulatory Visit | Attending: Radiation Oncology | Admitting: Radiation Oncology

## 2018-06-01 DIAGNOSIS — C7951 Secondary malignant neoplasm of bone: Secondary | ICD-10-CM | POA: Diagnosis not present

## 2018-06-01 DIAGNOSIS — Z510 Encounter for antineoplastic radiation therapy: Secondary | ICD-10-CM | POA: Diagnosis not present

## 2018-06-02 ENCOUNTER — Ambulatory Visit
Admission: RE | Admit: 2018-06-02 | Discharge: 2018-06-02 | Disposition: A | Discharge status: Home / Self Care | Payer: Medicare Other | Source: Ambulatory Visit | Attending: Radiation Oncology | Admitting: Radiation Oncology

## 2018-06-02 DIAGNOSIS — Z510 Encounter for antineoplastic radiation therapy: Secondary | ICD-10-CM | POA: Diagnosis not present

## 2018-06-02 DIAGNOSIS — C7951 Secondary malignant neoplasm of bone: Secondary | ICD-10-CM | POA: Diagnosis not present

## 2018-06-03 ENCOUNTER — Telehealth: Payer: Self-pay | Admitting: Hematology and Oncology

## 2018-06-03 ENCOUNTER — Other Ambulatory Visit: Payer: Self-pay | Admitting: Radiation Oncology

## 2018-06-03 ENCOUNTER — Encounter (HOSPITAL_COMMUNITY): Payer: Self-pay

## 2018-06-03 ENCOUNTER — Emergency Department (HOSPITAL_COMMUNITY): Payer: Medicare Other

## 2018-06-03 ENCOUNTER — Other Ambulatory Visit: Payer: Self-pay

## 2018-06-03 ENCOUNTER — Emergency Department (HOSPITAL_COMMUNITY)
Admission: EM | Admit: 2018-06-03 | Discharge: 2018-06-03 | Disposition: A | Discharge status: Home / Self Care | Payer: Medicare Other | Attending: Emergency Medicine | Admitting: Emergency Medicine

## 2018-06-03 DIAGNOSIS — Z87891 Personal history of nicotine dependence: Secondary | ICD-10-CM | POA: Diagnosis not present

## 2018-06-03 DIAGNOSIS — Z7982 Long term (current) use of aspirin: Secondary | ICD-10-CM | POA: Diagnosis not present

## 2018-06-03 DIAGNOSIS — R52 Pain, unspecified: Secondary | ICD-10-CM | POA: Diagnosis not present

## 2018-06-03 DIAGNOSIS — Z79899 Other long term (current) drug therapy: Secondary | ICD-10-CM | POA: Diagnosis not present

## 2018-06-03 DIAGNOSIS — R091 Pleurisy: Secondary | ICD-10-CM | POA: Insufficient documentation

## 2018-06-03 DIAGNOSIS — E039 Hypothyroidism, unspecified: Secondary | ICD-10-CM | POA: Insufficient documentation

## 2018-06-03 DIAGNOSIS — I10 Essential (primary) hypertension: Secondary | ICD-10-CM | POA: Insufficient documentation

## 2018-06-03 DIAGNOSIS — R0789 Other chest pain: Secondary | ICD-10-CM | POA: Diagnosis not present

## 2018-06-03 DIAGNOSIS — R079 Chest pain, unspecified: Secondary | ICD-10-CM | POA: Diagnosis not present

## 2018-06-03 DIAGNOSIS — I491 Atrial premature depolarization: Secondary | ICD-10-CM | POA: Diagnosis not present

## 2018-06-03 DIAGNOSIS — J449 Chronic obstructive pulmonary disease, unspecified: Secondary | ICD-10-CM | POA: Diagnosis not present

## 2018-06-03 DIAGNOSIS — G893 Neoplasm related pain (acute) (chronic): Secondary | ICD-10-CM

## 2018-06-03 DIAGNOSIS — M150 Primary generalized (osteo)arthritis: Secondary | ICD-10-CM

## 2018-06-03 DIAGNOSIS — R05 Cough: Secondary | ICD-10-CM | POA: Diagnosis not present

## 2018-06-03 DIAGNOSIS — R0602 Shortness of breath: Secondary | ICD-10-CM | POA: Diagnosis not present

## 2018-06-03 DIAGNOSIS — R51 Headache: Secondary | ICD-10-CM | POA: Diagnosis not present

## 2018-06-03 DIAGNOSIS — C3412 Malignant neoplasm of upper lobe, left bronchus or lung: Secondary | ICD-10-CM

## 2018-06-03 DIAGNOSIS — M159 Polyosteoarthritis, unspecified: Secondary | ICD-10-CM

## 2018-06-03 DIAGNOSIS — M60862 Other myositis, left lower leg: Secondary | ICD-10-CM

## 2018-06-03 LAB — BASIC METABOLIC PANEL
Anion gap: 9 (ref 5–15)
BUN: 7 mg/dL — ABNORMAL LOW (ref 8–23)
CALCIUM: 9.2 mg/dL (ref 8.9–10.3)
CO2: 24 mmol/L (ref 22–32)
Chloride: 99 mmol/L (ref 98–111)
Creatinine, Ser: 0.82 mg/dL (ref 0.61–1.24)
GFR calc Af Amer: >60 mL/min (ref >60)
GFR calc non Af Amer: >60 mL/min (ref >60)
Glucose, Bld: 88 mg/dL (ref 70–99)
Potassium: 4.1 mmol/L (ref 3.5–5.1)
Sodium: 132 mmol/L — ABNORMAL LOW (ref 135–145)

## 2018-06-03 LAB — CBC
HCT: 40 % (ref 39.0–52.0)
Hemoglobin: 13.1 g/dL (ref 13.0–17.0)
MCH: 32.3 pg (ref 26.0–34.0)
MCHC: 32.8 g/dL (ref 30.0–36.0)
MCV: 98.8 fL (ref 80.0–100.0)
Platelets: 157 10*3/uL (ref 150–400)
RBC: 4.05 MIL/uL — ABNORMAL LOW (ref 4.22–5.81)
RDW: 13.6 % (ref 11.5–15.5)
WBC: 6.2 10*3/uL (ref 4.0–10.5)
nRBC: 0 % (ref 0.0–0.2)

## 2018-06-03 LAB — I-STAT TROPONIN, ED
TROPONIN I, POC: 0 ng/mL (ref 0.00–0.08)
Troponin i, poc: 0.01 ng/mL (ref 0.00–0.08)

## 2018-06-03 MED ORDER — IOPAMIDOL (ISOVUE-370) INJECTION 76%
100.0000 mL | Freq: Once | INTRAVENOUS | Status: AC | PRN
  Administered 2018-06-03: 100 mL via INTRAVENOUS

## 2018-06-03 MED ORDER — OXYCODONE-ACETAMINOPHEN 5-325 MG PO TABS
2.0000 | ORAL_TABLET | Freq: Once | ORAL | Status: AC
  Administered 2018-06-03: 2 via ORAL
  Filled 2018-06-03: qty 2

## 2018-06-03 MED ORDER — OXYCODONE HCL 5 MG PO TABS: 5.0000 mg | ORAL_TABLET | Freq: Four times a day (QID) | ORAL | 0 refills | Status: DC | PRN

## 2018-06-03 MED ORDER — MORPHINE SULFATE (PF) 4 MG/ML IV SOLN
6.0000 mg | Freq: Once | INTRAVENOUS | Status: AC
  Administered 2018-06-03: 6 mg via INTRAVENOUS
  Filled 2018-06-03: qty 2

## 2018-06-03 MED ORDER — KETOROLAC TROMETHAMINE 30 MG/ML IJ SOLN
30.0000 mg | Freq: Once | INTRAMUSCULAR | Status: AC
  Administered 2018-06-03: 30 mg via INTRAVENOUS
  Filled 2018-06-03: qty 1

## 2018-06-03 MED ORDER — IOPAMIDOL (ISOVUE-370) INJECTION 76%
INTRAVENOUS | Status: AC
  Filled 2018-06-03: qty 100

## 2018-06-03 MED ORDER — ONDANSETRON HCL 4 MG/2ML IJ SOLN
4.0000 mg | Freq: Once | INTRAMUSCULAR | Status: AC
  Administered 2018-06-03: 4 mg via INTRAVENOUS
  Filled 2018-06-03: qty 2

## 2018-06-03 NOTE — Discharge Instructions (Addendum)
Your chest pain is likely due to cancer pain.  You may increase your roxicodone at home for better pain control.  Follow up closely with your doctor for further care.  Return if you have any concerns.

## 2018-06-03 NOTE — ED Triage Notes (Signed)
Pt to [facility] ED via [facility] with c/o 7/10 left sided CP that started this morning at 0600. States radiates up to left jaw, sharp, and worse with inspiration. States Hx bilateral lung cancer and received radiation treatment to left lung and rib bone last Thursday, Friday, Monday, Tuesday, and Wednesday. Feels pain is coming from his left rib cage and radiating upward with inspiration.  EMS gave 324 aspirin and 2 SL NTG en route without relief.  Vitals on arrival 128/80 98 SR 20 RR 100% RA  Patient seems a bit uncomfortable but not in distress at this time.

## 2018-06-03 NOTE — Telephone Encounter (Signed)
Scheduled appt per 1/28 sch message - per patient unable to come in next week scheduled following appt.

## 2018-06-03 NOTE — ED Provider Notes (Signed)
[facility] EMERGENCY DEPARTMENT Provider Note   CSN: [id number] Arrival date & time: 06/03/18  1737     History   Chief Complaint Chief Complaint  Patient presents with  . Chest Pain    HPI Trevor Griffin is a 63 y.o. male.  The history is provided by the patient and medical records. No language interpreter was used.  Chest Pain     63 year old male with significant history of cardiac disease, alcohol abuse, anxiety, chronic pain, COPD, metastatic lung cancer status post radiation therapy brought here via EMS from home for evaluation of chest pain.  Patient states he recently diagnosed with metastatic disease involving his left ribs in which he has been receiving radiation therapy, last dose was a few days ago.  Since earlier today he has had pain to the left side of his chest.  He described as a pressure sharp sensation radiates to the left side of jaw and to his left arm.  Endorse some lightheadedness, dizziness, nausea, and shortness of breath without diaphoresis.  Pain is currently 10 out of 10.  Pain feels similar to prior MI that he had in the past.  He tries taking his aspirin at home with minimal relief, EMS gave him 2 sublingual nitro but patient mentioned no improvement from that.  He denies fever or chills, no productive cough, hemoptysis, abdominal pain focal numbness or weakness or rash.  Past Medical History:  Diagnosis Date  . Acute ST elevation myocardial infarction (STEMI) involving left anterior descending (LAD) coronary artery ([place]) 2008 and 2009   5/'08 - DES x 4 to p-m LAD; 2/'09 - PTCA of  in-stent thrombosis  . Alcohol abuse   . Anxiety   . CAD S/P percutaneous coronary angioplasty 2008   a. Ant STEMI 5/08 w/ 4 Taxus DES p-m LAD (3.0 x 16,16,24,28 mm) ;  b. 2/'09:  Stopped Plavix-> Ant STEMI/stent thrombosis in LAD (PTCA - 3.8 mm), LCX 81md;  c. 10/12: Cath/PCI: p-m LAD stents 10-20% ISR, mLAD 40%, CFX 30-40%, mCFX 90% (Promus Premier DES  3.0 mm x 20 mm - 3.241m), EF 35%; d. cath 2/17/'16 no Sig CAD, patent LAD & Cx stents, EF 40-45%  . Chronic pain   . COPD (chronic obstructive pulmonary disease) (HCFriona  . Depression   . Fatigue    Sleep study 6/08 was negative. Sleep study 5/13 showed no OSA.  . Marland KitchenERD (gastroesophageal reflux disease)   . Headache(784.0)    migraines  . Hx of radiation therapy 10/25/12-12/10/12    Right Tongue/ Bilateral Neck //Total Dose, 60 Gy in 30 Fractions  . Hyperlipidemia   . Hypertension   . Ischemic cardiomyopathy    a. Echo 08/20/11 with EF 50-55% (previously 35%);  b. 02/2014 Echo: EF 45-50-%, mid-apicalanteroseptal AK, Gr 1 dd, mild AI, midlly dil LA.  . Lung cancer (HCM[place]owndx'd 2015  . Obesity   . Tobacco abuse   . Tongue cancer (HCOrange Covedx'd 2014   s/p resection - [facility]  . Umbilical hernia     Patient Active Problem List   Diagnosis Date Noted  . Metastasis to bone (HCCoaldale01/11/2018  . Goals of care, counseling/discussion 05/11/2018  . Lung cancer (HCChapman10/28/2019  . Malignant neoplasm of upper lobe of left lung (HCGlenshaw10/12/2017  . Acute pain of right shoulder 01/01/2018  . Cachexia (HCAmelia Court House08/20/2019  . Myositis of left lower leg 04/04/2017  . Iron deficiency anemia 10/13/2016  . 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 Acquired hypothyroidism 03/20/2016  . Nodule of right lung 04/04/2013  . 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[name]) 10/13/2012  . Tracheostomy stoma dilation ([place]) 09/27/2012  . Essential hypertension 08/25/2011  . Osteoarthritis 03/26/2011  . Low back pain 03/26/2011  . Cardiomyopathy, ischemic-EF 45-50% by 2D Oct 2015 11/06/2009  . Dyslipidemia 09/26/2009  . CAD S/P multiple PCIs 09/26/2009    Past Surgical History:  Procedure Laterality Date  . COLONOSCOPY N/A 11/25/2016   Procedure: COLONOSCOPY;  Surgeon: Clarene Essex, MD;  Location: [facility];  Service: Endoscopy;  Laterality: N/A;  . CORONARY ANGIOPLASTY WITH STENT  PLACEMENT  2008-2012   a. 5/'08: Anterior STEMI: Proximal-mid LAD PCI: 4 total Taxus DES stents 3.0 mm x  16 mm, 16 mm, 24 mm and 28 mm.( post-dil 3.5 mm);; b. 2/'09: PTCA LAD in-stent thrombosis - 3.8 mm;; c. 10/'12: mCx Promus Element DES 3.0 x 20 mm- post-dil - 3.25 mm. -> patent in 2016  . ESOPHAGOGASTRODUODENOSCOPY (EGD) WITH PROPOFOL N/A 11/25/2016   Procedure: ESOPHAGOGASTRODUODENOSCOPY (EGD) WITH PROPOFOL;  Surgeon: Clarene Essex, MD;  Location: [facility];  Service: Endoscopy;  Laterality: N/A;  neonatal scope  . FRACTURE SURGERY Right    as a child,leg  . FUDUCIAL PLACEMENT N/A 01/15/2018   Procedure: PLACEMENT OF FUDUCIAL;  Surgeon: Melrose Nakayama, MD;  Location: [facility];  Service: Thoracic;  Laterality: N/A;  . LEFT HEART CATH AND CORONARY ANGIOGRAPHY  2006-2012   a) 2006: minimal CAD, normal EF;; b) 5/'08 - Ant STEMI - 100% p-mLAD (DES x 4 in LAD);; c)2/'09: Ant STEMI for IST/thrombosis of LAD ->PTCA, 80% mCx;; 10/'12: mCx 80-90% - DES PCI, patent LAD stents, EF ~35% - anterolateral AK, apical severe HK.  Marland Kitchen LEFT HEART CATHETERIZATION WITH CORONARY ANGIOGRAM N/A 06/21/2014   Procedure: LEFT HEART CATHETERIZATION WITH CORONARY ANGIOGRAM;  Surgeon: Burnell Blanks, MD;  Location: [facility] CATH LAB: No evidence of obstructive disease.  Patent LAD and circumflex stents.  EF 40-45%.  . LOBECTOMY Right 06/06/2013   Procedure: LOBECTOMY;  Surgeon: Melrose Nakayama, MD;  Location: [facility];  Service: Thoracic;  Laterality: Right;  . TRANSTHORACIC ECHOCARDIOGRAM  02/2014   EF 45-50%.  Akinesis of the mid apical anteroseptal wall.  GR 1 DD.  Mild MR.  . VIDEO ASSISTED THORACOSCOPY (VATS)/WEDGE RESECTION Right 06/06/2013   Procedure: VIDEO ASSISTED THORACOSCOPY (VATS)/WEDGE RESECTION;  Surgeon: Melrose Nakayama, MD;  Location: [facility];  Service: Thoracic;  Laterality: Right;  Marland Kitchen VIDEO BRONCHOSCOPY WITH ENDOBRONCHIAL NAVIGATION N/A 01/15/2018   Procedure: VIDEO BRONCHOSCOPY WITH ENDOBRONCHIAL  NAVIGATION;  Surgeon: Melrose Nakayama, MD;  Location: [facility];  Service: Thoracic;  Laterality: N/A;        Home Medications    Prior to Admission medications   Medication Sig Start Date End Date Taking? Authorizing Provider  amoxicillin-clavulanate (AUGMENTIN ES-600) 600-42.9 MG/5ML suspension Take 5 mLs (600 mg total) by mouth 2 (two) times daily. 05/18/18   Tanner, Lyndon Code., PA-C  aspirin 81 MG tablet Take 1 tablet (81 mg total) by mouth daily. 02/25/14   Theora Gianotti, NP  B Complex-C (B-COMPLEX WITH VITAMIN C) tablet Take 1 tablet daily by mouth. 03/22/17   Lavina Hamman, MD  cyclobenzaprine (FLEXERIL) 5 MG tablet TAKE 1 TABLET BY MOUTH 2 TIMES DAILY AS NEEDED FOR MUSCLE SPASMS 03/03/18   Janith Lima, MD  dronabinol (MARINOL) 2.5 MG capsule Take 1 capsule (2.5 mg total) by mouth 2 (two) times daily  before lunch and supper. Patient not taking: Reported on 02/09/2018 12/22/17   Janith Lima, MD  esomeprazole (NEXIUM) 20 MG capsule Take 20 mg by mouth daily.     [provider]  feeding supplement, ENSURE ENLIVE, (ENSURE ENLIVE) LIQD Take 237 mLs by mouth 4 (four) times daily. Patient not taking: Reported on 02/09/2018 04/16/17   Janith Lima, MD  Glycopyrrolate [facility] REFILL KIT) 25 MCG/ML SOLN Inhale 1 Act into the lungs 2 (two) times daily. 04/16/17   Janith Lima, MD  levothyroxine (SYNTHROID, LEVOTHROID) 175 MCG tablet Take 1 tablet (175 mcg total) by mouth daily before breakfast. 08/18/17   Janith Lima, MD  metoprolol tartrate (LOPRESSOR) 25 MG tablet Take 0.5 tablets (12.5 mg total) by mouth 2 (two) times daily. Patient taking differently: Take 12.5 mg by mouth See admin instructions. Take 12.106m by mouth once daily, may take additional 12.563mif blood pressure is above 140 04/16/17   JoJanith LimaMD  nicotine (NICODERM CQ - DOSED IN MG/24 HOURS) 14 mg/24hr patch Apply 14102match daily x 6 wk, then 7 mg patch daily x 2 wk 02/09/18    SquEppie GibsonD  nicotine (NICODERM CQ - DOSED IN MG/24 HR) 7 mg/24hr patch Apply 84m30mtch daily x 6 wk, then 7 mg patch daily x 2 wk 02/09/18   SquiEppie Gibson  nitroGLYCERIN (NITROSTAT) 0.4 MG SL tablet Place 1 tablet (0.4 mg total) under the tongue every 5 (five) minutes as needed (up to 3 doses). For chest pain 02/25/14   BergTheora Gianotti  oxyCODONE (OXY IR/ROXICODONE) 5 MG immediate release tablet Take 1 tablet (5 mg total) by mouth every 6 (six) hours as needed for severe pain. 04/05/18   JoneJanith Lima  predniSONE (DELTASONE) 20 MG tablet Take 1 tablet (20 mg total) by mouth daily with breakfast. 05/11/18   GorsHeath Lark    Family History Family History  Problem Relation Age of Onset  . Kidney disease Mother        CHRONIC  . Cancer Sister   . Liver disease Sister        LIVER FAILURE  . Heart attack Brother     Social History Social History   Tobacco Use  . Smoking status: Former Smoker    Years: 46.00    Types: Cigars  . Smokeless tobacco: Never Used  . Tobacco comment: He has quit smoking recently, he is using a patch  Substance Use Topics  . Alcohol use: Yes    Alcohol/week: 12.0 standard drinks    Types: 12 Cans of beer per week  . Drug use: No     Allergies   Hydromorphone and Nubain [nalbuphine hcl]   Review of Systems Review of Systems  Cardiovascular: Positive for chest pain.  All other systems reviewed and are negative.    Physical Exam Updated Vital Signs BP 116/62   Pulse 100   Temp (!) 97.5 F (36.4 C) (Axillary)   Resp 19   Ht 5' 8"  (1.727 m)   Wt 74.4 kg   SpO2 99%   BMI 24.94 kg/m   Physical Exam Vitals signs and nursing note reviewed.  Constitutional:      General: He is not in acute distress.    Appearance: He is well-developed.     Comments: Speech is difficult to understand secondary to tongue cancer status post tongue surgery.  HENT:     Head: Atraumatic.  Eyes:  Conjunctiva/sclera: Conjunctivae  normal.  Neck:     Musculoskeletal: Neck supple.  Cardiovascular:     Rate and Rhythm: Normal rate and regular rhythm.  Chest:     Chest wall: Tenderness (Tenderness about the left chest on palpation without any overlying skin changes crepitus or emphysema.) present.  Abdominal:     Palpations: Abdomen is soft.     Tenderness: There is no abdominal tenderness.  Musculoskeletal:        General: No swelling.  Skin:    Findings: No rash.  Neurological:     Mental Status: He is alert and oriented to person, place, and time.      ED Treatments / Results  Labs (all labs ordered are listed, but only abnormal results are displayed) Labs Reviewed  BASIC METABOLIC PANEL - Abnormal; Notable for the following components:      Result Value   Sodium 132 (*)    BUN 7 (*)    All other components within normal limits  CBC - Abnormal; Notable for the following components:   RBC 4.05 (*)    All other components within normal limits  I-STAT TROPONIN, ED  I-STAT TROPONIN, ED    EKG None  ED ECG REPORT   Date: 06/03/2018  Rate: 97  Rhythm: normal sinus rhythm  QRS Axis: normal  Intervals: normal  ST/T Wave abnormalities: nonspecific T wave changes  Conduction Disutrbances:none  Narrative Interpretation:   Old EKG Reviewed: unchanged  I have personally reviewed the EKG tracing and agree with the computerized printout as noted.   Radiology Dg Chest 2 View  Result Date: 06/03/2018 CLINICAL DATA:  Shortness of breath and cough. History of lung cancer. EXAM: CHEST - 2 VIEW COMPARISON:  Chest x-ray 01/15/2018 FINDINGS: The heart is normal in size and stable. Stable coronary artery stents. Chronic lung changes. No acute pulmonary findings. Three small fiducial seeds are noted in the left lung. No pleural effusion. The bony thorax is grossly intact. The rib lesions are not well demonstrated. IMPRESSION: Chronic lung changes.  No definite acute pulmonary findings. Electronically Signed   By:  Marijo Sanes M.D.   On: 06/03/2018 18:41   Ct Angio Chest Pe W And/or Wo Contrast  Result Date: 06/03/2018 CLINICAL DATA:  Chest pain since this morning. History of lung cancer. EXAM: CT ANGIOGRAPHY CHEST WITH CONTRAST TECHNIQUE: Multidetector CT imaging of the chest was performed using the standard protocol during bolus administration of intravenous contrast. Multiplanar CT image reconstructions and MIPs were obtained to evaluate the vascular anatomy. CONTRAST:  155m ISOVUE-370 IOPAMIDOL (ISOVUE-370) INJECTION 76% COMPARISON:  Chest CT 01/12/2018 FINDINGS: Cardiovascular: The heart is mildly enlarged but stable. No pericardial effusion. There is tortuosity and calcification of the thoracic aorta. Extensive three-vessel coronary artery calcifications for age. The pulmonary arterial tree is fairly well opacified. No filling defects to suggest pulmonary embolism. Mediastinum/Nodes: Stable scattered mediastinal and hilar lymph nodes. The esophagus is grossly normal. Lungs/Pleura: Small thick-walled cavitary lesion in the left upper lobe measuring 16 mm. The surrounding density is likely due to radiation. No new/acute pulmonary findings or new pulmonary nodules. Small bilateral pleural effusions. This previously measured 19 x 18 mm. Small surrounding fiducial markers are noted in the left upper lobe. Upper Abdomen: No significant upper abdominal findings. Musculoskeletal: The anterolateral sixth rib is now completely destroyed. Associated soft tissue mass measures 7.8 x 2.5 cm. No definite new bone lesions. Review of the MIP images confirms the above findings. IMPRESSION: 1. No CT findings for  pulmonary embolism. 2. Smaller cavitary lesion in the left upper lobe with some surrounding radiation changes. 3. No new pulmonary lesions or new pulmonary nodules. 4. Interval progressive destructive bony changes involving the left anterolateral sixth rib with associated soft tissue component. No definite new bone lesions.  5. Small pleural effusions. Aortic Atherosclerosis (ICD10-I70.0). Electronically Signed   By: Marijo Sanes M.D.   On: 06/03/2018 21:06    Procedures Procedures (including critical care time)  Medications Ordered in ED Medications  iopamidol (ISOVUE-370) 76 % injection (has no administration in time range)  morphine 4 MG/ML injection 6 mg (6 mg Intravenous Given 06/03/18 1850)  ondansetron (ZOFRAN) injection 4 mg (4 mg Intravenous Given 06/03/18 1850)  iopamidol (ISOVUE-370) 76 % injection 100 mL (100 mLs Intravenous Contrast Given 06/03/18 2027)  morphine 4 MG/ML injection 6 mg (6 mg Intravenous Given 06/03/18 2129)     Initial Impression / Assessment and Plan / ED Course  I have reviewed the triage vital signs and the nursing notes.  Pertinent labs & imaging results that were available during my care of the patient were reviewed by me and considered in my medical decision making (see chart for details).     BP 116/62   Pulse 100   Temp (!) 97.5 F (36.4 C) (Axillary)   Resp 19   Ht 5' 8"  (1.727 m)   Wt 74.4 kg   SpO2 99%   BMI 24.94 kg/m    Final Clinical Impressions(s) / ED Diagnoses   Final diagnoses:  Pleurisy    ED Discharge Orders         Ordered    oxyCODONE (OXY IR/ROXICODONE) 5 MG immediate release tablet  Every 6 hours PRN     06/03/18 2237         6:12 PM Patient with significant history of cardiac disease as well's lung cancer here with chest pain that felt similar to prior MI.  Work-up initiated, medication given for pain.  10:11 PM EKG without acute ischemic changes.  Negative delta troponin.  Electrolyte panels are reassuring, normal WBC.  Chest CT angiogram showed no evidence of PE.  Small cavitary lesion in the left upper lobe with some surrounding radiation changes which is suspected given his history of lung cancer.  Interval progression destructive bony changes involving the left anterolateral sixth ribs with associated soft tissue component  without any new bone lesions.  Small pleural effusion.  Suspect patient's pain is secondary to pleurisy and low suspicion for ACS.  I discussed this with Dr. Venora Maples.  L anterior chest pain likely 2/2 malignancy.  Pt received adequate pain control here.  Recommend increase his oxycodone as needed at home and f/u closely with his provider for further care.    Domenic Moras, PA-C 06/03/18 2238    Jola Schmidt, MD 06/03/18 2248

## 2018-06-04 ENCOUNTER — Encounter: Payer: Self-pay | Admitting: Radiation Oncology

## 2018-06-04 NOTE — Progress Notes (Signed)
  Radiation Oncology         [phone] ________________________________  Name: Trevor Griffin MRN: [id number]  Date: 06/04/2018  DOB: [birth year]  End of Treatment Note  Diagnosis:   Cancer Staging Carcinoma of anterior two-thirds of tongue [facility]) Staging form: Lip and Oral Cavity, AJCC 7th Edition - Pathologic stage from 09/16/2012: Stage III (T3, N1, cM0) - Unsigned - Clinical: No stage assigned - Unsigned  Lung cancer ([place]) Staging form: Lung, AJCC 8th Edition - Clinical: Stage IV (cT1c, cN0, pM1) - Signed by Heath Lark, MD on 05/11/2018  Malignant neoplasm of upper lobe of left lung ([place]) Staging form: Lung, AJCC 8th Edition - Clinical: Stage IA2 (cT1b, cN0, cM0) - Signed by [name], MD on 02/09/2018     Indication for treatment:  Palliative       Radiation treatment dates:   05/27/2018-06/02/2018  Site/dose:   Left 6th rib tumor, 5 Gy x 5 fractions for a total dose of 25 Gy  Beams/energy:   3D, 6X//10X  Narrative: The patient tolerated radiation treatment relatively well.      Plan: The patient has completed radiation treatment. The patient will return to radiation oncology clinic for routine followup in one month. I advised them to call or return sooner if they have any questions or concerns related to their recovery or treatment.  -----------------------------------  [name], MD  This document serves as a record of services personally performed by [name], MD. It was created on her behalf by Steva Colder, a trained medical scribe. The creation of this record is based on the scribe's personal observations and the provider's statements to them. This document has been checked and approved by the attending provider.

## 2018-06-15 ENCOUNTER — Telehealth: Payer: Self-pay

## 2018-06-15 ENCOUNTER — Telehealth: Payer: Self-pay | Admitting: Hematology and Oncology

## 2018-06-15 ENCOUNTER — Ambulatory Visit: Payer: Medicare Other | Admitting: Hematology and Oncology

## 2018-06-15 NOTE — Telephone Encounter (Signed)
Wife called and left a message to call her.  Called back. She needs to cancel today's appt. [name] is sick and has to go to ER, she thinks she has pneumonia.  Scheduling message sent to reschedule.

## 2018-06-15 NOTE — Telephone Encounter (Signed)
Called patient per 2/11 sch message to r/s - unable to reach patient - left message for patient to call back to r/s

## 2018-06-22 ENCOUNTER — Ambulatory Visit (INDEPENDENT_AMBULATORY_CARE_PROVIDER_SITE_OTHER): Payer: Medicare Other | Admitting: Internal Medicine

## 2018-06-22 ENCOUNTER — Encounter: Payer: Self-pay | Admitting: Internal Medicine

## 2018-06-22 ENCOUNTER — Other Ambulatory Visit (INDEPENDENT_AMBULATORY_CARE_PROVIDER_SITE_OTHER): Payer: Medicare Other

## 2018-06-22 VITALS — BP 138/78 | HR 72 | Resp 16 | Ht 68.0 in | Wt 168.1 lb

## 2018-06-22 DIAGNOSIS — I2510 Atherosclerotic heart disease of native coronary artery without angina pectoris: Secondary | ICD-10-CM | POA: Diagnosis not present

## 2018-06-22 DIAGNOSIS — C7951 Secondary malignant neoplasm of bone: Secondary | ICD-10-CM

## 2018-06-22 DIAGNOSIS — G8929 Other chronic pain: Secondary | ICD-10-CM

## 2018-06-22 DIAGNOSIS — E039 Hypothyroidism, unspecified: Secondary | ICD-10-CM

## 2018-06-22 DIAGNOSIS — Z9861 Coronary angioplasty status: Secondary | ICD-10-CM | POA: Diagnosis not present

## 2018-06-22 DIAGNOSIS — R0789 Other chest pain: Secondary | ICD-10-CM

## 2018-06-22 DIAGNOSIS — G893 Neoplasm related pain (acute) (chronic): Secondary | ICD-10-CM

## 2018-06-22 LAB — TSH: TSH: 11.93 u[IU]/mL — ABNORMAL HIGH (ref 0.35–4.50)

## 2018-06-22 LAB — LIPID PANEL
Cholesterol: 145 mg/dL (ref 0–200)
HDL: 72.2 mg/dL (ref >39.00)
LDL Cholesterol: 59 mg/dL (ref 0–99)
NonHDL: 72.5
Total CHOL/HDL Ratio: 2
Triglycerides: 66 mg/dL (ref 0.0–149.0)
VLDL: 13.2 mg/dL (ref 0.0–40.0)

## 2018-06-22 MED ORDER — OXYCODONE HCL 10 MG PO TABS: 10.0000 mg | ORAL_TABLET | Freq: Three times a day (TID) | ORAL | 0 refills | Status: DC | PRN

## 2018-06-22 MED ORDER — LEVOTHYROXINE SODIUM 175 MCG PO TABS: 175.0000 ug | ORAL_TABLET | Freq: Every day | ORAL | 0 refills | Status: AC

## 2018-06-22 NOTE — Progress Notes (Signed)
Subjective:  Patient ID: Trevor Griffin, male    DOB: [birth year]  Age: 63 y.o. MRN: [id number]  CC: Hypothyroidism   HPI Trevor Griffin [facility] presents for f/up - Based on his prescription refills he would have run out of his thyroid supplement months ago.  He does complain of fatigue and weight gain.  He tells me that his current dose of oxycodone is not controlling his pain.  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  Outpatient Medications Prior to Visit  Medication Sig Dispense Refill  . aspirin 81 MG tablet Take 1 tablet (81 mg total) by mouth daily. 30 tablet 1  . B Complex-C (B-COMPLEX WITH VITAMIN C) tablet Take 1 tablet daily by mouth. 30 tablet 0  . cyclobenzaprine (FLEXERIL) 5 MG tablet TAKE 1 TABLET BY MOUTH 2 TIMES DAILY AS NEEDED FOR MUSCLE SPASMS (Patient taking differently: Take 5 mg by mouth 2 (two) times daily as needed for muscle spasms. ) 60 tablet 3  . dronabinol (MARINOL) 2.5 MG capsule Take 1 capsule (2.5 mg total) by mouth 2 (two) times daily before lunch and supper. 60 capsule 0  . esomeprazole (NEXIUM) 20 MG capsule Take 20 mg by mouth daily.     . feeding supplement, ENSURE ENLIVE, (ENSURE ENLIVE) LIQD Take 237 mLs by mouth 4 (four) times daily. 120 Bottle 11  . Glycopyrrolate (LONHALA MAGNAIR REFILL KIT) 25 MCG/ML SOLN Inhale 1 Act into the lungs 2 (two) times daily. 60 mL 11  . metoprolol tartrate (LOPRESSOR) 25 MG tablet Take 0.5 tablets (12.5 mg total) by mouth 2 (two) times daily. (Patient taking differently: Take 12.5 mg by mouth 2 (two) times daily. ) 180 tablet 1  . nicotine ([employer] NICOTINE TRANSDERMAL SYS) 14 mg/24hr patch APPLY 14MG PATCH DAILY X 6 WEEKS, THEN 7 MG PATCH DAILY X 2 WEEKS 14 patch 2  . nicotine (NICODERM CQ - DOSED IN MG/24 HR) 7 mg/24hr patch Apply 72m patch daily x 6 wk, then 7 mg patch daily x 2 wk 14 patch 0  . nitroGLYCERIN (NITROSTAT) 0.4 MG SL tablet Place 1 tablet (0.4 mg  total) under the tongue every 5 (five) minutes as needed (up to 3 doses). For chest pain 25 tablet 3  . levothyroxine (SYNTHROID, LEVOTHROID) 175 MCG tablet Take 1 tablet (175 mcg total) by mouth daily before breakfast. 90 tablet 1  . oxyCODONE (OXY IR/ROXICODONE) 5 MG immediate release tablet Take 1 tablet (5 mg total) by mouth every 6 (six) hours as needed for severe pain. 20 tablet 0  . predniSONE (DELTASONE) 20 MG tablet Take 1 tablet (20 mg total) by mouth daily with breakfast. 14 tablet 0  . amoxicillin-clavulanate (AUGMENTIN ES-600) 600-42.9 MG/5ML suspension Take 5 mLs (600 mg total) by mouth 2 (two) times daily. (Patient not taking: Reported on 06/03/2018) 75 mL 0   No facility-administered medications prior to visit.     ROS Review of Systems  Constitutional: Positive for fatigue and unexpected weight change (wt gain). Negative for chills and diaphoresis.  HENT: Positive for trouble swallowing.   Respiratory: Negative for cough, chest tightness, shortness of breath and wheezing.   Cardiovascular: Positive for chest pain. Negative for palpitations and leg swelling.  Gastrointestinal: Negative for abdominal pain, constipation, diarrhea, nausea and vomiting.  Endocrine: Negative for cold intolerance and heat intolerance.  Genitourinary: Negative.  Negative for difficulty urinating and dysuria.  Musculoskeletal: Negative.  Negative for arthralgias  and myalgias.  Skin: Negative.  Negative for color change, pallor and rash.  Neurological: Negative.  Negative for dizziness, weakness and light-headedness.  Hematological: Negative for adenopathy. Does not bruise/bleed easily.  Psychiatric/Behavioral: Negative.  Negative for decreased concentration, dysphoric mood and sleep disturbance. The patient is not nervous/anxious.     Objective:  BP 138/78 (BP Location: Right Arm, Patient Position: Sitting, Cuff Size: Normal)   Pulse 72   Resp 16   Ht 5' 8"  (1.727 m)   Wt 168 lb 1.3 oz (76.2 kg)    SpO2 97%   BMI 25.56 kg/m   BP Readings from Last 3 Encounters:  06/22/18 138/78  06/03/18 133/69  05/18/18 (!) 144/72    Wt Readings from Last 3 Encounters:  06/22/18 168 lb 1.3 oz (76.2 kg)  06/03/18 164 lb (74.4 kg)  05/18/18 168 lb 3.2 oz (76.3 kg)    Physical Exam Vitals signs reviewed.  Constitutional:      Appearance: He is ill-appearing.  HENT:     Nose: Nose normal.     Mouth/Throat:     Mouth: Mucous membranes are moist.     Pharynx: Oropharynx is clear. No oropharyngeal exudate or posterior oropharyngeal erythema.  Eyes:     General: No scleral icterus.    Conjunctiva/sclera: Conjunctivae normal.  Neck:     Musculoskeletal: Normal range of motion and neck supple. No muscular tenderness.  Cardiovascular:     Rate and Rhythm: Normal rate and regular rhythm.     Heart sounds: No murmur.  Pulmonary:     Effort: Pulmonary effort is normal. No respiratory distress.     Breath sounds: No stridor. No wheezing, rhonchi or rales.  Chest:    Abdominal:     General: Bowel sounds are normal.     Palpations: There is no hepatomegaly, splenomegaly or mass.     Tenderness: There is no abdominal tenderness.  Musculoskeletal: Normal range of motion.        General: No swelling.     Right lower leg: No edema.     Left lower leg: No edema.  Lymphadenopathy:     Cervical: No cervical adenopathy.  Skin:    General: Skin is warm.  Neurological:     General: No focal deficit present.     Mental Status: Mental status is at baseline.     Lab Results  Component Value Date   WBC 6.2 06/03/2018   HGB 13.1 06/03/2018   HCT 40.0 06/03/2018   PLT 157 06/03/2018   GLUCOSE 88 06/03/2018   CHOL 145 06/22/2018   TRIG 66.0 06/22/2018   HDL 72.20 06/22/2018   LDLDIRECT 148.4 10/18/2009   LDLCALC 59 06/22/2018   ALT 14 04/26/2018   AST 17 04/26/2018   NA 132 (L) 06/03/2018   K 4.1 06/03/2018   CL 99 06/03/2018   CREATININE 0.82 06/03/2018   BUN 7 (L) 06/03/2018   CO2  24 06/03/2018   TSH 11.93 (H) 06/22/2018   INR 1.05 01/15/2018   HGBA1C 4.2 (L) 04/16/2017    Dg Chest 2 View  Result Date: 06/03/2018 CLINICAL DATA:  Shortness of breath and cough. History of lung cancer. EXAM: CHEST - 2 VIEW COMPARISON:  Chest x-ray 01/15/2018 FINDINGS: The heart is normal in size and stable. Stable coronary artery stents. Chronic lung changes. No acute pulmonary findings. Three small fiducial seeds are noted in the left lung. No pleural effusion. The bony thorax is grossly intact. The rib lesions are not well  demonstrated. IMPRESSION: Chronic lung changes.  No definite acute pulmonary findings. Electronically Signed   By: Marijo Sanes M.D.   On: 06/03/2018 18:41   Ct Angio Chest Pe W And/or Wo Contrast  Result Date: 06/03/2018 CLINICAL DATA:  Chest pain since this morning. History of lung cancer. EXAM: CT ANGIOGRAPHY CHEST WITH CONTRAST TECHNIQUE: Multidetector CT imaging of the chest was performed using the standard protocol during bolus administration of intravenous contrast. Multiplanar CT image reconstructions and MIPs were obtained to evaluate the vascular anatomy. CONTRAST:  126m ISOVUE-370 IOPAMIDOL (ISOVUE-370) INJECTION 76% COMPARISON:  Chest CT 01/12/2018 FINDINGS: Cardiovascular: The heart is mildly enlarged but stable. No pericardial effusion. There is tortuosity and calcification of the thoracic aorta. Extensive three-vessel coronary artery calcifications for age. The pulmonary arterial tree is fairly well opacified. No filling defects to suggest pulmonary embolism. Mediastinum/Nodes: Stable scattered mediastinal and hilar lymph nodes. The esophagus is grossly normal. Lungs/Pleura: Small thick-walled cavitary lesion in the left upper lobe measuring 16 mm. The surrounding density is likely due to radiation. No new/acute pulmonary findings or new pulmonary nodules. Small bilateral pleural effusions. This previously measured 19 x 18 mm. Small surrounding fiducial markers  are noted in the left upper lobe. Upper Abdomen: No significant upper abdominal findings. Musculoskeletal: The anterolateral sixth rib is now completely destroyed. Associated soft tissue mass measures 7.8 x 2.5 cm. No definite new bone lesions. Review of the MIP images confirms the above findings. IMPRESSION: 1. No CT findings for pulmonary embolism. 2. Smaller cavitary lesion in the left upper lobe with some surrounding radiation changes. 3. No new pulmonary lesions or new pulmonary nodules. 4. Interval progressive destructive bony changes involving the left anterolateral sixth rib with associated soft tissue component. No definite new bone lesions. 5. Small pleural effusions. Aortic Atherosclerosis (ICD10-I70.0). Electronically Signed   By: PMarijo SanesM.D.   On: 06/03/2018 21:06    Assessment & Plan:   JEldowas seen today for hypothyroidism.  Diagnoses and all orders for this visit:  Acquired hypothyroidism- His TSH is up to 12 and he is symptomatic.  I have asked him to restart levothyroxine. -     Lipid panel; Future -     TSH; Future -     levothyroxine (SYNTHROID, LEVOTHROID) 175 MCG tablet; Take 1 tablet (175 mcg total) by mouth daily before breakfast.  Metastasis to bone ([facility]- Will increase his strength of oxycodone to get better pain control. -     Oxycodone HCl 10 MG TABS; Take 1 tablet (10 mg total) by mouth 3 (three) times daily as needed.  Cancer-related pain -     Oxycodone HCl 10 MG TABS; Take 1 tablet (10 mg total) by mouth 3 (three) times daily as needed.  Chest wall pain, chronic -     Oxycodone HCl 10 MG TABS; Take 1 tablet (10 mg total) by mouth 3 (three) times daily as needed.  CAD S/P multiple PCIs- He is not willing to take a statin for CV risk reduction. -     Lipid panel; Future   I have discontinued JRoberth Berling Cara's predniSONE, amoxicillin-clavulanate, and oxyCODONE. I am also having him start on Oxycodone HCl. Additionally, I am having him maintain his  aspirin, nitroGLYCERIN, esomeprazole, B-complex with vitamin C, metoprolol tartrate, Glycopyrrolate, feeding supplement (ENSURE ENLIVE), dronabinol, nicotine, cyclobenzaprine, nicotine, and levothyroxine.  Meds ordered this encounter  Medications  . Oxycodone HCl 10 MG TABS    Sig: Take 1 tablet (10 mg total) by mouth 3 (  three) times daily as needed.    Dispense:  90 tablet    Refill:  0  . levothyroxine (SYNTHROID, LEVOTHROID) 175 MCG tablet    Sig: Take 1 tablet (175 mcg total) by mouth daily before breakfast.    Dispense:  90 tablet    Refill:  0     Follow-up: Return in about 3 months (around 09/20/2018).  Scarlette Calico, MD

## 2018-06-22 NOTE — Patient Instructions (Signed)
Hypothyroidism  Hypothyroidism is when the thyroid gland does not make enough of certain hormones (it is underactive). The thyroid gland is a small gland located in the lower front part of the neck, just in front of the windpipe (trachea). This gland makes hormones that help control how the body uses food for energy (metabolism) as well as how the heart and brain function. These hormones also play a role in keeping your bones strong. When the thyroid is underactive, it produces too little of the hormones thyroxine (T4) and triiodothyronine (T3). What are the causes? This condition may be caused by:  Hashimoto's disease. This is a disease in which the body's disease-fighting system (immune system) attacks the thyroid gland. This is the most common cause.  Viral infections.  Pregnancy.  Certain medicines.  Birth defects.  Past radiation treatments to the head or neck for cancer.  Past treatment with radioactive iodine.  Past exposure to radiation in the environment.  Past surgical removal of part or all of the thyroid.  Problems with a gland in the center of the brain (pituitary gland).  Lack of enough iodine in the diet. What increases the risk? You are more likely to develop this condition if:  You are male.  You have a family history of thyroid conditions.  You use a medicine called lithium.  You take medicines that affect the immune system (immunosuppressants). What are the signs or symptoms? Symptoms of this condition include:  Feeling as though you have no energy (lethargy).  Not being able to tolerate cold.  Weight gain that is not explained by a change in diet or exercise habits.  Lack of appetite.  Dry skin.  Coarse hair.  Menstrual irregularity.  Slowing of thought processes.  Constipation.  Sadness or depression. How is this diagnosed? This condition may be diagnosed based on:  Your symptoms, your medical history, and a physical exam.  Blood  tests. You may also have imaging tests, such as an ultrasound or MRI. How is this treated? This condition is treated with medicine that replaces the thyroid hormones that your body does not make. After you begin treatment, it may take several weeks for symptoms to go away. Follow these instructions at home:  Take over-the-counter and prescription medicines only as told by your health care provider.  If you start taking any new medicines, tell your health care provider.  Keep all follow-up visits as told by your health care provider. This is important. ? As your condition improves, your dosage of thyroid hormone medicine may change. ? You will need to have blood tests regularly so that your health care provider can monitor your condition. Contact a health care provider if:  Your symptoms do not get better with treatment.  You are taking thyroid replacement medicine and you: ? Sweat a lot. ? Have tremors. ? Feel anxious. ? Lose weight rapidly. ? Cannot tolerate heat. ? Have emotional swings. ? Have diarrhea. ? Feel weak. Get help right away if you have:  Chest pain.  An irregular heartbeat.  A rapid heartbeat.  Difficulty breathing. Summary  Hypothyroidism is when the thyroid gland does not make enough of certain hormones (it is underactive).  When the thyroid is underactive, it produces too little of the hormones thyroxine (T4) and triiodothyronine (T3).  The most common cause is Hashimoto's disease, a disease in which the body's disease-fighting system (immune system) attacks the thyroid gland. The condition can also be caused by viral infections, medicine, pregnancy, or past   radiation treatment to the head or neck.  Symptoms may include weight gain, dry skin, constipation, feeling as though you do not have energy, and not being able to tolerate cold.  This condition is treated with medicine to replace the thyroid hormones that your body does not make. This information  is not intended to replace advice given to you by your health care provider. Make sure you discuss any questions you have with your health care provider. Document Released: 04/21/2005 Document Revised: 04/01/2017 Document Reviewed: 04/01/2017 Elsevier Interactive Patient Education  2019 [employer]  

## 2018-06-24 ENCOUNTER — Inpatient Hospital Stay: Payer: Medicare Other | Admitting: Hematology and Oncology

## 2018-06-24 ENCOUNTER — Telehealth: Payer: Self-pay

## 2018-06-24 NOTE — Telephone Encounter (Signed)
Called back. Canceling today's appt due to not feeling well. They are both sick. Scheduling message sent to reschedule to next week.

## 2018-06-24 NOTE — Telephone Encounter (Signed)
She called and left a message to call her.  Called back and left a message for her to call the office.

## 2018-06-28 ENCOUNTER — Encounter: Payer: Self-pay | Admitting: Hematology and Oncology

## 2018-06-28 ENCOUNTER — Inpatient Hospital Stay: Payer: Medicare Other | Attending: Hematology and Oncology | Admitting: Hematology and Oncology

## 2018-06-28 DIAGNOSIS — Z7982 Long term (current) use of aspirin: Secondary | ICD-10-CM | POA: Insufficient documentation

## 2018-06-28 DIAGNOSIS — C3490 Malignant neoplasm of unspecified part of unspecified bronchus or lung: Secondary | ICD-10-CM

## 2018-06-28 DIAGNOSIS — Z79899 Other long term (current) drug therapy: Secondary | ICD-10-CM | POA: Insufficient documentation

## 2018-06-28 DIAGNOSIS — Z902 Acquired absence of lung [part of]: Secondary | ICD-10-CM

## 2018-06-28 DIAGNOSIS — C7951 Secondary malignant neoplasm of bone: Secondary | ICD-10-CM | POA: Insufficient documentation

## 2018-06-28 DIAGNOSIS — C3412 Malignant neoplasm of upper lobe, left bronchus or lung: Secondary | ICD-10-CM | POA: Diagnosis present

## 2018-06-28 DIAGNOSIS — G893 Neoplasm related pain (acute) (chronic): Secondary | ICD-10-CM | POA: Insufficient documentation

## 2018-06-28 NOTE — Progress Notes (Signed)
[facility] OFFICE PROGRESS NOTE  Patient Care Team: Janith Lima, MD as PCP - General (Internal Medicine)  ASSESSMENT & PLAN:  Lung cancer [facility]) I have reviewed recent imaging study with the patient, comparing with the PET CT scan from December CT imaging showed enlarging soft tissue component on his chest wall He has worsening pain control We discussed palliative treatment I sent significant hesitation from him and his wife about pursuing systemic treatment Given strong PDL 1 positivity, I think he would qualify for immunotherapy alone I briefly discussed the expected risk and benefits and side effects of treatment but he is not willing to consider this for now He has appointment to see radiation oncologist at the end of the week to discuss other options He will call me if he is interested to pursue systemic therapy  Metastasis to bone [facility]) He has significant cancer associated pain. He saw his primary care doctor recently who has changed and adjusted his pain medicine. I will defer to him for further management   No orders of the defined types were placed in this encounter.   INTERVAL HISTORY: Please see below for problem oriented charting. He returns with his wife for further follow-up The patient has changed his appointment recently due to bad weather He has been complaining of left chest wall pain and went to the emergency department for evaluation He said that the pain was severe radiating down to the left arm.  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 Right neck LN dissection (zone 2 & 3)---1/21 LNs positive for metastatic [facility]    09/16/2012 Surgery    Tracheotomy, bilateral selective neck dissection, subtotal glossectomy with left radial forearm fasicocutaneous free flap reconstruction with split-thickness skin graft from left thigh ([place])     09/21/2012 Surgery    G-tube placement [facility])    09/23/2012 Procedure    Trach removal [facility])    10/15/2012 Imaging    Staging CT c/a/p: No acute process or evidence of metastatic disease in the chest, abd, or pelvis. Similar tiny RUL lung nodule since 2006, consistent with a benign etiology.     10/25/2012 - 12/10/2012 Radiation Therapy    IMRT Isidore Moos). Right Tongue/ Bilateral Neck /// Total Dose, 60 Gy in 30 Fractions    03/03/2013 Imaging    CT chest: Enlarging (and now cavitary) nodule in RLL measuring 1.3 cm (previous exam measured 4 mm). Suspicious for SCC metastasis.     02/2013 Procedure    PEG removed     05/09/2013 Imaging    CT Chest showed further increase in size of cavitary pulmonary nodule in the posterior right lower lobe now measuring 19 mm, with adjacent ill-defined nodular opacity possibly due to associated postobstructive atelectasis or pneumonitis. 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 Roxan Hockey)    06/06/2013 Pathology Results    Accession: [id number] pathology report from wedge resection of the lung confirmed squamous cell carcinoma of the lung    08/09/2013 Imaging    CT Chest showed interval development of multiple ground-glass attenuating nodules within the left lower lobe which are likely the  sequelae of inflammation, infection or aspiration. Short-term followup imaging in 3 months is recommended to ensure resolution. 2. Status post right lower lobectomy. 3. Small right pleural effusion    11/15/2013 Imaging    Postoperative changes of right lower lobectomy. Interval resolution of the left lower lobe ground-glass opacity nodules. Small right pleural effusion persists.    07/13/2014 Imaging    CT chest: s/p RUL lobectomy. No findings to suggest recurrent disease or new mets to thorax. Tiny 4 mm LUL nodule unchanged from 10/2012 and considered benign.      12/14/2014 Imaging    CT chest: Stable postoperative chest CT. No evidence of local reurrence or metastatic disease. Stable postsurgical scarring in right lung.     06/29/2015 Imaging    CXR: Patient evaluated for acute concerns regarding fatigue, cough, and dysphagia.       03/07/2016 Imaging    Ct chest showed new ill-defined nodular ground-glass throughout the right hemi thorax with some associated septal thickening, worrisome for lymphangitic carcinomatosis. An infectious etiology is not excluded. Aortic atherosclerosis (ICD10-170.0). Coronary artery calcification. Cholelithiasis.    05/08/2016 Imaging    Ct chest showed interval resolution of the infectious or inflammatory process in the right lung. Minimal residual tiny airspace nodules but no infiltrates or effusions.    10/20/2016 Imaging    Stable post treatment changes in the neck without evidence of recurrent disease.    10/20/2016 Imaging    1. Stable examination with scattered tiny pulmonary nodules bilaterally. 2. No acute findings or evidence of metastatic disease. 3. Age advanced coronary artery atherosclerosis. Aortic Atherosclerosis (ICD10-I70.0). 4. Cholelithiasis.    11/24/2017 Imaging    1. Stable and satisfactory post treatment appearance of the neck. 2. Chest CT today reported separately    11/24/2017 Imaging    1. Thick-walled cavitary nodule in the  lingula, grossly stable from 03/16/2017 but new from 10/20/2016 and highly worrisome for malignancy, either metastatic head and neck cancer or primary bronchogenic carcinoma. 2. Aortic atherosclerosis (ICD10-170.0). Coronary artery calcification. 3. Borderline enlarged pulmonary arteries. 4. Hepatic steatosis. 5. Cholelithiasis.    12/17/2017 PET scan    1. The cavitary 1.6 cm left upper lobe nodule has a maximum SUV of 6.9, compatible with malignancy. 2. Faint focus of accentuated activity in the left anterolateral sixth rib with maximum SUV 3.1, slightly asymmetric. No underlying appreciable fracture or underlying CT abnormality. This may well simply be incidental but I would suggest attention to the left anterolateral sixth rib on any follow up imaging. 3. Other imaging findings of potential clinical significance: Mucous retention cyst in left maxillary sinus. Left mastoid effusion. Aortic Atherosclerosis (ICD10-I70.0). Coronary atherosclerosis. Prior right lower lobectomy. Prior myocardial infarction with fat density in the left ventricular myocardial. Descending and proximal sigmoid colon diverticulosis. Right scrotal hydrocele. Cholelithiasis. Diffuse hepatic steatosis. Right mid kidney cyst.     History of lung cancer (Resolved)   06/06/2013 Pathology Results    1. Lung, wedge biopsy/resection, Right lower lobe - SQUAMOUS CELL CARCINOMA, 3 CM. 2. Lung, wedge biopsy/resection, Right upper lobe - BENIGN LUNG TISSUE. - NO TUMOR IDENTIFIED. 3. Lung,  resection (segmental or lobe), Right lower lobe - FOCAL HEMORRHAGE CONSISTENT WITH PREVIOUS WEDGE. - NO RESIDUAL TUMOR IDENTIFIED. - FINAL MARGINS CLEAR. 4. Lymph node, biopsy, 11R#1 - ANTHRACOTIC LYMPH NODE. - NO TUMOR IDENTIFIED 5. Lymph node, biopsy, 11R#2 - ANTHRACOTIC LYMPH NODE. - NO TUMOR IDENTIFIED. 6. Lymph node, biopsy, 10 R #1 - ANTHRACOTIC LYMPH NODE. - NO TUMOR IDENTIFIED. Marland Kitchen 7. Lymph node, biopsy, 10 R #2 - ANTHRACOTIC LYMPH  NODE. - NO TUMOR IDENTIFIED. 8. Lymph node, biopsy, level 9 - ANTHRACOTIC LYMPH NODE. - NO TUMOR IDENTIFIED.    06/06/2013 Surgery    Right video-assisted thoracoscopy, wedge resection, thoracoscopic right lower lobectomy, lymph node dissection     Malignant neoplasm of upper lobe of left lung ([place])   12/17/2017 PET scan    IMPRESSION: 1. The cavitary 1.6 cm left upper lobe nodule has a maximum SUV of 6.9, compatible with malignancy. 2. Faint focus of accentuated activity in the left anterolateral sixth rib with maximum SUV 3.1, slightly asymmetric. No underlying appreciable fracture or underlying CT abnormality. This may well simply be incidental but I would suggest attention to the left anterolateral sixth rib on any follow up imaging.  3. Other imaging findings of potential clinical significance: Mucous retention cyst in left maxillary sinus. Left mastoid effusion. Aortic Atherosclerosis (ICD10-I70.0). Coronary atherosclerosis. Prior right lower lobectomy. Prior myocardial infarction with fat density in the left ventricular myocardial. Descending and proximal sigmoid colon diverticulosis. Right scrotal hydrocele. Cholelithiasis. Diffuse hepatic steatosis. Right mid kidney cyst.     01/15/2018 Pathology Results    Lung, biopsy, Left upper - SQUAMOUS CELL CARCINOMA. - SEE COMMENT. Microscopic Comment Dr Vicente Males has reviewed the case and concurs with this interpretation. Dr. Roxan Hockey was paged on 01/18/18. There is limited tumor present for additional studies if requested.    02/09/2018 Initial Diagnosis    Malignant neoplasm of upper lobe of left lung ([place])    02/09/2018 Cancer Staging    Staging form: Lung, AJCC 8th Edition - Clinical: Stage IA2 (cT1b, cN0, cM0) - Signed by [name], MD on 02/09/2018    02/24/2018 - 03/01/2018 Radiation Therapy    He was treated with SBRT Radiation treatment dates:   02/24/2018, 02/26/2018, 03/01/2018  Site/dose:   Left Lung // 54 Gy in 3  fractions  Beams/energy:   SBRT/SRT-VMAT // 6X-FFF Photon     05/12/2018 Imaging    MRI brain No evidence of metastatic disease.  Generalized brain volume loss advanced for age. Chronic small-vessel ischemic changes.  Bilateral mastoid effusions     Lung cancer ([place])   01/15/2018 Pathology Results    Lung, biopsy, Left upper - SQUAMOUS CELL CARCINOMA. - SEE COMMENT. Microscopic Comment Dr Vicente Males has reviewed the case and concurs with this interpretation. Dr. Roxan Hockey was paged on 01/18/18. There is limited tumor present for additional studies if requested.    04/26/2018 PET scan    1. Unfortunately, there is a new metastatic lesion in the anteromedial LEFT sixth rib with intense metabolic activity. 2. Interval decrease in size and metabolic activity of LEFT upper lobe cavitary nodule 3. No evidence additional  metastatic disease.     05/11/2018 Cancer Staging    Staging form: Lung, AJCC 8th Edition - Clinical: Stage IV (cT1c, cN0, pM1) - Signed by Heath Lark, MD on 05/11/2018    05/27/2018 - 06/02/2018 Radiation Therapy    Radiation treatment dates:   05/27/2018-06/02/2018  Site/dose:   Left 6th rib tumor, 5 Gy x 5 fractions  for a total dose of 25 Gy  Beams/energy:   3D, 6X//10X     06/03/2018 Imaging    IMPRESSION: 1. No CT findings for pulmonary embolism. 2. Smaller cavitary lesion in the left upper lobe with some surrounding radiation changes. 3. No new pulmonary lesions or new pulmonary nodules. 4. Interval progressive destructive bony changes involving the left anterolateral sixth rib with associated soft tissue component. No definite new bone lesions. 5. Small pleural effusions.  Aortic Atherosclerosis (ICD10-I70.0).     REVIEW OF SYSTEMS:   Constitutional: Denies fevers, chills or abnormal weight loss Eyes: Denies blurriness of vision Ears, nose, mouth, throat, and face: Denies mucositis or sore throat Respiratory: Denies cough, dyspnea or  wheezes Cardiovascular: Denies palpitation, chest discomfort or lower extremity swelling Gastrointestinal:  Denies nausea, heartburn or change in bowel habits Skin: Denies abnormal skin rashes Lymphatics: Denies new lymphadenopathy or easy bruising Neurological:Denies numbness, tingling or new weaknesses Behavioral/Psych: Mood is stable, no new changes  All other systems were reviewed with the patient and are negative.  I have reviewed the past medical history, past surgical history, social history and family history with the patient and they are unchanged from previous note.  ALLERGIES:  is allergic to hydromorphone and nubain [nalbuphine hcl].  MEDICATIONS:  Current Outpatient Medications  Medication Sig Dispense Refill  . aspirin 81 MG tablet Take 1 tablet (81 mg total) by mouth daily. 30 tablet 1  . B Complex-C (B-COMPLEX WITH VITAMIN C) tablet Take 1 tablet daily by mouth. 30 tablet 0  . cyclobenzaprine (FLEXERIL) 5 MG tablet TAKE 1 TABLET BY MOUTH 2 TIMES DAILY AS NEEDED FOR MUSCLE SPASMS (Patient taking differently: Take 5 mg by mouth 2 (two) times daily as needed for muscle spasms. ) 60 tablet 3  . dronabinol (MARINOL) 2.5 MG capsule Take 1 capsule (2.5 mg total) by mouth 2 (two) times daily before lunch and supper. 60 capsule 0  . esomeprazole (NEXIUM) 20 MG capsule Take 20 mg by mouth daily.     . feeding supplement, ENSURE ENLIVE, (ENSURE ENLIVE) LIQD Take 237 mLs by mouth 4 (four) times daily. 120 Bottle 11  . Glycopyrrolate (LONHALA MAGNAIR REFILL KIT) 25 MCG/ML SOLN Inhale 1 Act into the lungs 2 (two) times daily. 60 mL 11  . levothyroxine (SYNTHROID, LEVOTHROID) 175 MCG tablet Take 1 tablet (175 mcg total) by mouth daily before breakfast. 90 tablet 0  . metoprolol tartrate (LOPRESSOR) 25 MG tablet Take 0.5 tablets (12.5 mg total) by mouth 2 (two) times daily. (Patient taking differently: Take 12.5 mg by mouth 2 (two) times daily. ) 180 tablet 1  . nicotine ([employer] NICOTINE  TRANSDERMAL SYS) 14 mg/24hr patch APPLY 14MG PATCH DAILY X 6 WEEKS, THEN 7 MG PATCH DAILY X 2 WEEKS 14 patch 2  . nicotine (NICODERM CQ - DOSED IN MG/24 HR) 7 mg/24hr patch Apply 71m patch daily x 6 wk, then 7 mg patch daily x 2 wk 14 patch 0  . nitroGLYCERIN (NITROSTAT) 0.4 MG SL tablet Place 1 tablet (0.4 mg total) under the tongue every 5 (five) minutes as needed (up to 3 doses). For chest pain 25 tablet 3  . Oxycodone HCl 10 MG TABS Take 1 tablet (10 mg total) by mouth 3 (three) times daily as needed. 90 tablet 0   No current facility-administered medications for this visit.     PHYSICAL EXAMINATION: ECOG PERFORMANCE STATUS: 1 - Symptomatic but completely ambulatory  Vitals:   06/28/18 1017  BP: (!) 139/58  Pulse: 66  Resp: 17  SpO2: 100%   Filed Weights   06/28/18 1017  Weight: 167 lb 6.4 oz (75.9 kg)    GENERAL:alert, no distress and comfortable SKIN: skin color, texture, turgor are normal, no rashes or significant lesions Musculoskeletal:no cyanosis of digits and no clubbing  NEURO: alert & oriented x 3 with fluent speech, no focal motor/sensory deficits  LABORATORY DATA:  I have reviewed the data as listed    Component Value Date/Time   NA 132 (L) 06/03/2018 1823   NA 137 03/04/2017 1509   NA 135 (L) 01/12/2017 0845   K 4.1 06/03/2018 1823   K 4.7 01/12/2017 0845   CL 99 06/03/2018 1823   CO2 24 06/03/2018 1823   CO2 24 01/12/2017 0845   GLUCOSE 88 06/03/2018 1823   GLUCOSE 91 01/12/2017 0845   BUN 7 (L) 06/03/2018 1823   BUN 10 03/04/2017 1509   BUN 8.0 01/12/2017 0845   CREATININE 0.82 06/03/2018 1823   CREATININE 0.9 01/12/2017 0845   CALCIUM 9.2 06/03/2018 1823   CALCIUM 9.7 01/12/2017 0845   PROT 7.4 04/26/2018 1319   PROT 7.1 03/04/2017 1509   PROT 7.7 01/12/2017 0845   ALBUMIN 3.8 04/26/2018 1319   ALBUMIN 4.2 03/04/2017 1509   ALBUMIN 3.6 01/12/2017 0845   AST 17 04/26/2018 1319   AST 21 01/12/2017 0845   ALT 14 04/26/2018 1319   ALT 16  01/12/2017 0845   ALKPHOS 97 04/26/2018 1319   ALKPHOS 94 01/12/2017 0845   BILITOT 0.7 04/26/2018 1319   BILITOT 0.9 03/04/2017 1509   BILITOT 0.41 01/12/2017 0845   GFRNONAA >60 06/03/2018 1823   GFRAA >60 06/03/2018 1823    No results found for: SPEP, UPEP  Lab Results  Component Value Date   WBC 6.2 06/03/2018   NEUTROABS 3.2 04/26/2018   HGB 13.1 06/03/2018   HCT 40.0 06/03/2018   MCV 98.8 06/03/2018   PLT 157 06/03/2018      Chemistry      Component Value Date/Time   NA 132 (L) 06/03/2018 1823   NA 137 03/04/2017 1509   NA 135 (L) 01/12/2017 0845   K 4.1 06/03/2018 1823   K 4.7 01/12/2017 0845   CL 99 06/03/2018 1823   CO2 24 06/03/2018 1823   CO2 24 01/12/2017 0845   BUN 7 (L) 06/03/2018 1823   BUN 10 03/04/2017 1509   BUN 8.0 01/12/2017 0845   CREATININE 0.82 06/03/2018 1823   CREATININE 0.9 01/12/2017 0845      Component Value Date/Time   CALCIUM 9.2 06/03/2018 1823   CALCIUM 9.7 01/12/2017 0845   ALKPHOS 97 04/26/2018 1319   ALKPHOS 94 01/12/2017 0845   AST 17 04/26/2018 1319   AST 21 01/12/2017 0845   ALT 14 04/26/2018 1319   ALT 16 01/12/2017 0845   BILITOT 0.7 04/26/2018 1319   BILITOT 0.9 03/04/2017 1509   BILITOT 0.41 01/12/2017 0845       RADIOGRAPHIC STUDIES: I have reviewed multiple imaging studies with the patient I have personally reviewed the radiological images as listed and agreed with the findings in the report. Dg Chest 2 View  Result Date: 06/03/2018 CLINICAL DATA:  Shortness of breath and cough. History of lung cancer. EXAM: CHEST - 2 VIEW COMPARISON:  Chest x-ray 01/15/2018 FINDINGS: The heart is normal in size and stable. Stable coronary artery stents. Chronic lung changes. No acute pulmonary findings. Three small fiducial seeds are noted in the left lung. No  pleural effusion. The bony thorax is grossly intact. The rib lesions are not well demonstrated. IMPRESSION: Chronic lung changes.  No definite acute pulmonary findings.  Electronically Signed   By: Marijo Sanes M.D.   On: 06/03/2018 18:41   Ct Angio Chest Pe W And/or Wo Contrast  Result Date: 06/03/2018 CLINICAL DATA:  Chest pain since this morning. History of lung cancer. EXAM: CT ANGIOGRAPHY CHEST WITH CONTRAST TECHNIQUE: Multidetector CT imaging of the chest was performed using the standard protocol during bolus administration of intravenous contrast. Multiplanar CT image reconstructions and MIPs were obtained to evaluate the vascular anatomy. CONTRAST:  128m ISOVUE-370 IOPAMIDOL (ISOVUE-370) INJECTION 76% COMPARISON:  Chest CT 01/12/2018 FINDINGS: Cardiovascular: The heart is mildly enlarged but stable. No pericardial effusion. There is tortuosity and calcification of the thoracic aorta. Extensive three-vessel coronary artery calcifications for age. The pulmonary arterial tree is fairly well opacified. No filling defects to suggest pulmonary embolism. Mediastinum/Nodes: Stable scattered mediastinal and hilar lymph nodes. The esophagus is grossly normal. Lungs/Pleura: Small thick-walled cavitary lesion in the left upper lobe measuring 16 mm. The surrounding density is likely due to radiation. No new/acute pulmonary findings or new pulmonary nodules. Small bilateral pleural effusions. This previously measured 19 x 18 mm. Small surrounding fiducial markers are noted in the left upper lobe. Upper Abdomen: No significant upper abdominal findings. Musculoskeletal: The anterolateral sixth rib is now completely destroyed. Associated soft tissue mass measures 7.8 x 2.5 cm. No definite new bone lesions. Review of the MIP images confirms the above findings. IMPRESSION: 1. No CT findings for pulmonary embolism. 2. Smaller cavitary lesion in the left upper lobe with some surrounding radiation changes. 3. No new pulmonary lesions or new pulmonary nodules. 4. Interval progressive destructive bony changes involving the left anterolateral sixth rib with associated soft tissue component. No  definite new bone lesions. 5. Small pleural effusions. Aortic Atherosclerosis (ICD10-I70.0). Electronically Signed   By: PMarijo SanesM.D.   On: 06/03/2018 21:06    All questions were answered. The patient knows to call the clinic with any problems, questions or concerns. No barriers to learning was detected.  I spent 15 minutes counseling the patient face to face. The total time spent in the appointment was 20 minutes and more than 50% was on counseling and review of test results  NHeath Lark MD 06/28/2018 10:58 AM

## 2018-06-28 NOTE — Assessment & Plan Note (Signed)
I have reviewed recent imaging study with the patient, comparing with the PET CT scan from December CT imaging showed enlarging soft tissue component on his chest wall He has worsening pain control We discussed palliative treatment I sent significant hesitation from him and his wife about pursuing systemic treatment Given strong PDL 1 positivity, I think he would qualify for immunotherapy alone I briefly discussed the expected risk and benefits and side effects of treatment but he is not willing to consider this for now He has appointment to see radiation oncologist at the end of the week to discuss other options He will call me if he is interested to pursue systemic therapy

## 2018-06-28 NOTE — Assessment & Plan Note (Signed)
He has significant cancer associated pain. He saw his primary care doctor recently who has changed and adjusted his pain medicine. I will defer to him for further management

## 2018-06-29 ENCOUNTER — Encounter: Payer: Self-pay | Admitting: Radiation Oncology

## 2018-06-29 NOTE — Progress Notes (Signed)
error 

## 2018-07-02 ENCOUNTER — Telehealth: Payer: Self-pay

## 2018-07-02 ENCOUNTER — Telehealth: Payer: Self-pay | Admitting: General Practice

## 2018-07-02 ENCOUNTER — Ambulatory Visit
Admission: RE | Admit: 2018-07-02 | Discharge: 2018-07-02 | Disposition: A | Discharge status: Home / Self Care | Payer: Medicare Other | Source: Ambulatory Visit | Attending: Radiation Oncology | Admitting: Radiation Oncology

## 2018-07-02 DIAGNOSIS — C7951 Secondary malignant neoplasm of bone: Secondary | ICD-10-CM | POA: Insufficient documentation

## 2018-07-02 DIAGNOSIS — Z510 Encounter for antineoplastic radiation therapy: Secondary | ICD-10-CM | POA: Insufficient documentation

## 2018-07-02 HISTORY — DX: Personal history of irradiation: Z92.3

## 2018-07-02 NOTE — Telephone Encounter (Signed)
Mr. Mroczkowski wife called asking to reschedule his apt for today. She reports that Mr. Suddreth is in a lot of pain and is not able to come today. She reports that he has pain medicine prescribed, but is unable to pay for it at this time. I offered to call the Social Workers at the cancer center to see if they could help him with his financial difficulties and she was agreeable. I called and left a voice mail with the cancer center social workers. We rescheduled his appointment for 07/09/18 with Dr. Isidore Moos.

## 2018-07-02 NOTE — Telephone Encounter (Signed)
[facility] CSW Progress Notes  Call from [employer] in [facility], patient having difficulty affording pain medications.  CSW checked w available resources for medication assistance, none are able to assist w controlled substances in any way.  Spoke w patient and explained lack of available resources for pain medications.  Patient wants to apply for Medicaid.  Will mail patient application for Medicaid - advised that he will need to file this at local [place] office.    Edwyna Shell, LCSW Clinical Social Worker Phone:  [phone]

## 2018-07-05 NOTE — Progress Notes (Signed)
Trevor Griffin presents for follow up of radiation completed 06/02/18 to his left 6th rib. He saw Dr. Alvy Bimler on 06/28/18. He is to call her if he is interested to pursue systemic therapy. He tells me today that he plans to decline chemotherapy. He continues to have pain to his left rib especially at night. He is taking 10 mg oxycodone once daily. He does not like to take his pain medicine at nighttime. They would like advice on other treatments that might be available for him besides chemotherapy.   BP 118/69 (BP Location: Right Arm)   Pulse 75   Temp (!) 97.5 F (36.4 C) (Oral)   Resp 18   Ht 5\' 8"  (1.727 m)   Wt 167 lb 9.6 oz (76 kg)   SpO2 100% Comment: room air  BMI 25.48 kg/m    Wt Readings from Last 3 Encounters:  07/09/18 167 lb 9.6 oz (76 kg)  06/28/18 167 lb 6.4 oz (75.9 kg)  06/22/18 168 lb 1.3 oz (76.2 kg)

## 2018-07-09 ENCOUNTER — Ambulatory Visit
Admission: RE | Admit: 2018-07-09 | Discharge: 2018-07-09 | Disposition: A | Discharge status: Home / Self Care | Payer: Medicare Other | Source: Ambulatory Visit | Attending: Radiation Oncology | Admitting: Radiation Oncology

## 2018-07-09 ENCOUNTER — Other Ambulatory Visit: Payer: Self-pay

## 2018-07-09 ENCOUNTER — Encounter: Payer: Self-pay | Admitting: Radiation Oncology

## 2018-07-09 ENCOUNTER — Other Ambulatory Visit: Payer: Self-pay | Admitting: Radiation Oncology

## 2018-07-09 VITALS — BP 118/69 | HR 75 | Temp 97.5°F | Resp 18 | Ht 68.0 in | Wt 167.6 lb

## 2018-07-09 DIAGNOSIS — Z7982 Long term (current) use of aspirin: Secondary | ICD-10-CM | POA: Insufficient documentation

## 2018-07-09 DIAGNOSIS — Z79899 Other long term (current) drug therapy: Secondary | ICD-10-CM | POA: Diagnosis not present

## 2018-07-09 DIAGNOSIS — R0781 Pleurodynia: Secondary | ICD-10-CM | POA: Diagnosis not present

## 2018-07-09 DIAGNOSIS — Z923 Personal history of irradiation: Secondary | ICD-10-CM | POA: Diagnosis not present

## 2018-07-09 DIAGNOSIS — C3412 Malignant neoplasm of upper lobe, left bronchus or lung: Secondary | ICD-10-CM | POA: Insufficient documentation

## 2018-07-09 DIAGNOSIS — C7951 Secondary malignant neoplasm of bone: Secondary | ICD-10-CM | POA: Diagnosis not present

## 2018-07-09 NOTE — Progress Notes (Signed)
Radiation Oncology         [phone] ________________________________  Name: Trevor Griffin MRN: [id number]  Date: 07/09/2018  DOB: 28-Sep-1955  Follow-Up Visit Note  Outpatient  CC: Janith Lima, MD  Melrose Nakayama, *  Diagnosis and Prior Radiotherapy: Cancer Staging Carcinoma of anterior two-thirds of tongue [facility]) Staging form: Lip and Oral Cavity, AJCC 7th Edition - Pathologic stage from 09/16/2012: Stage III (T3, N1, cM0) - Unsigned - Clinical: No stage assigned - Unsigned  Lung cancer ([place]) Staging form: Lung, AJCC 8th Edition - Clinical: Stage IV (cT1c, cN0, pM1) - Signed by Heath Lark, MD on 05/11/2018  Malignant neoplasm of upper lobe of left lung ([place]) Staging form: Lung, AJCC 8th Edition - Clinical: Stage IA2 (cT1b, cN0, cM0) - Signed by [name], MD on 02/09/2018    ICD-10-CM   1. Malignant neoplasm of upper lobe of left lung ([place]) C34.12 Ambulatory referral to Pain Clinic  2. Metastasis to bone [facility]) C79.51 Ambulatory referral to Pain Clinic    05/27/2018 - 06/02/2018: Left 6th Rib / 25 Gy in 5 fractions 10/23, 10/25, 03/01/2018: Left Lung / 54 Gy in 3 fractions (SBRT) 10/25/2012 - 12/10/2012: Right Tongue and Bilateral Neck / 60 Gy in 30 fractions  CHIEF COMPLAINT: Here for follow-up and surveillance of metastatic lung cancer  Narrative:  The patient returns today for routine follow-up. He is accompanied by his wife. He continues to have left rib pain, especially at night. He takes oxycodone once daily but he does not like to take pain medicine at night.  He recently presented to the ED on 06/03/2018 with chest pain. They performed chest CT scan with results showing enlarged soft tissue component on his chest wall. He met with Dr. Alvy Bimler on 06/28/2018, and they discussed systemic therapy options. He is leaning towards declining chemotherapy but waited to talk to me before deciding.   ALLERGIES:  is allergic to hydromorphone and nubain [nalbuphine  hcl].  Meds: Current Outpatient Medications  Medication Sig Dispense Refill  . aspirin 81 MG tablet Take 1 tablet (81 mg total) by mouth daily. 30 tablet 1  . B Complex-C (B-COMPLEX WITH VITAMIN C) tablet Take 1 tablet daily by mouth. 30 tablet 0  . cyclobenzaprine (FLEXERIL) 5 MG tablet TAKE 1 TABLET BY MOUTH 2 TIMES DAILY AS NEEDED FOR MUSCLE SPASMS (Patient taking differently: Take 5 mg by mouth 2 (two) times daily as needed for muscle spasms. ) 60 tablet 3  . dronabinol (MARINOL) 2.5 MG capsule Take 1 capsule (2.5 mg total) by mouth 2 (two) times daily before lunch and supper. 60 capsule 0  . esomeprazole (NEXIUM) 20 MG capsule Take 20 mg by mouth daily.     . Glycopyrrolate (LONHALA MAGNAIR REFILL KIT) 25 MCG/ML SOLN Inhale 1 Act into the lungs 2 (two) times daily. 60 mL 11  . levothyroxine (SYNTHROID, LEVOTHROID) 175 MCG tablet Take 1 tablet (175 mcg total) by mouth daily before breakfast. 90 tablet 0  . metoprolol tartrate (LOPRESSOR) 25 MG tablet Take 0.5 tablets (12.5 mg total) by mouth 2 (two) times daily. 180 tablet 1  . nicotine ([facility] NICOTINE TRANSDERMAL SYS) 14 mg/24hr patch APPLY '14MG'$  PATCH DAILY X 6 WEEKS, THEN 7 MG PATCH DAILY X 2 WEEKS 14 patch 2  . nitroGLYCERIN (NITROSTAT) 0.4 MG SL tablet Place 1 tablet (0.4 mg total) under the tongue every 5 (five) minutes as needed (up to 3 doses). For chest pain 25 tablet 3  . Oxycodone HCl  10 MG TABS Take 1 tablet (10 mg total) by mouth 3 (three) times daily as needed. 90 tablet 0  . feeding supplement, ENSURE ENLIVE, (ENSURE ENLIVE) LIQD Take 237 mLs by mouth 4 (four) times daily. (Patient not taking: Reported on 07/09/2018) 120 Bottle 11  . nicotine (NICODERM CQ - DOSED IN MG/24 HR) 7 mg/24hr patch Apply 92m patch daily x 6 wk, then 7 mg patch daily x 2 wk (Patient not taking: Reported on 07/09/2018) 14 patch 0   No current facility-administered medications for this encounter.     Physical Findings: The patient is in no acute distress.  Patient is alert and oriented.  height is _0  (1.727 m) and weight is 167 lb 9.6 oz (76 kg). His oral temperature is 97.5 F (36.4 C) (abnormal). His blood pressure is 118/69 and his pulse is 75. His respiration is 18 and oxygen saturation is 100%. .    General: Alert and oriented, in no acute distress HEENT: Head is normocephalic. Extraocular movements are intact. Chest: Tenderness to palpation on the left anterior rib cage, but no palpable mass in this area. Neurologic: No obvious focalities. Speech is fluent. Coordination is intact. Psychiatric: Judgment and insight are intact. Affect is appropriate.  Lab Findings: Lab Results  Component Value Date   WBC 6.2 06/03/2018   HGB 13.1 06/03/2018   HCT 40.0 06/03/2018   MCV 98.8 06/03/2018   PLT 157 06/03/2018    Radiographic Findings: No results found.  Impression/Plan:   Metastatic Lung Cancer with persistent rib pain after palliative radiotherapy  We reviewed his imaging and I explained that it is possible that the rib lesion had grown in size before he started radiotherapy.  There were weeks between his previous staging scan and the start of his radiotherapy.  Regarding his rib pain, I spoke with Dr. PClydell Hakimwhether a nerve block could be performed.  Dr. PClydell Hakimthinks this is a real possibility and I will make a referral to him.  Unfortunately the radiotherapy did not adequately palliate the patient's pain  The patient is having this difficulty deciding whether to pursue systemic therapy.  I think he would benefit from the support of palliative medicine.  I will make a referral accordingly.  He will follow up as needed in radiation oncology and continue close follow up with medical oncology.  I spent 20 minutes  face to face with the patient and more than 50% of that time was spent in counseling and/or coordination of care. _________________________   SEppie Gibson MD  This document serves as a record of services  personally performed by SEppie Gibson MD. It was created on her behalf by KWilburn Mylar a trained medical scribe. The creation of this record is based on the scribe's personal observations and the provider's statements to them. This document has been checked and approved by the attending provider.

## 2018-07-13 ENCOUNTER — Telehealth: Payer: Self-pay | Admitting: *Deleted

## 2018-07-13 NOTE — Telephone Encounter (Signed)
CALLED DR. HARKINS' OFFICE TO MAKE AN APPT. FOR THIS PATIENT, LVM FOR A RETURN CALL

## 2018-07-20 ENCOUNTER — Ambulatory Visit: Payer: Medicare Other | Admitting: Internal Medicine

## 2018-07-20 ENCOUNTER — Telehealth: Payer: Self-pay

## 2018-07-20 DIAGNOSIS — R059 Cough, unspecified: Secondary | ICD-10-CM

## 2018-07-20 DIAGNOSIS — R05 Cough: Secondary | ICD-10-CM

## 2018-07-20 NOTE — Telephone Encounter (Signed)
Copied from [place] [id number]. Topic: General - Other >> Jul 20, 2018  2:51 PM Percell Belt A wrote: Reason for CRM:   Pt wife called in and stated that pt is having back pain, coughing up green mucus, stabbing pains in his chest when he breathes, very feverish and achy.  She stated that pt wanted to make an appt with Dr Ronnald Ramp . I had advise he needed to speak with Triage.  They refused triage, continued to encouraged pt to speak with nurse or to be seen.  They continued to refuse.  She stated she would just call back tomorrow.  Wanted to give office heads up , as I believe pt needs to speak with DR Ronnald Ramp or nurse because he is quite a few symptoms  Patient is being seen today by dr Ronnald Ramp, ok to add per dr Ronnald Ramp

## 2018-07-21 ENCOUNTER — Ambulatory Visit (INDEPENDENT_AMBULATORY_CARE_PROVIDER_SITE_OTHER): Payer: Medicare Other | Admitting: Internal Medicine

## 2018-07-21 ENCOUNTER — Ambulatory Visit (INDEPENDENT_AMBULATORY_CARE_PROVIDER_SITE_OTHER)
Admission: RE | Admit: 2018-07-21 | Discharge: 2018-07-21 | Disposition: A | Discharge status: Home / Self Care | Payer: Medicare Other | Source: Ambulatory Visit | Attending: Internal Medicine | Admitting: Internal Medicine

## 2018-07-21 ENCOUNTER — Encounter: Payer: Self-pay | Admitting: Internal Medicine

## 2018-07-21 ENCOUNTER — Other Ambulatory Visit: Payer: Self-pay

## 2018-07-21 VITALS — BP 160/80 | HR 88 | Temp 97.4°F | Resp 16 | Ht 68.0 in | Wt 169.0 lb

## 2018-07-21 DIAGNOSIS — J189 Pneumonia, unspecified organism: Secondary | ICD-10-CM | POA: Diagnosis not present

## 2018-07-21 DIAGNOSIS — R079 Chest pain, unspecified: Secondary | ICD-10-CM | POA: Diagnosis not present

## 2018-07-21 DIAGNOSIS — R059 Cough, unspecified: Secondary | ICD-10-CM

## 2018-07-21 DIAGNOSIS — Z9861 Coronary angioplasty status: Secondary | ICD-10-CM

## 2018-07-21 DIAGNOSIS — I2510 Atherosclerotic heart disease of native coronary artery without angina pectoris: Secondary | ICD-10-CM | POA: Diagnosis not present

## 2018-07-21 DIAGNOSIS — R05 Cough: Secondary | ICD-10-CM | POA: Diagnosis not present

## 2018-07-21 MED ORDER — AMOXICILLIN-POT CLAVULANATE 875-125 MG PO TABS: 1.0000 | ORAL_TABLET | Freq: Two times a day (BID) | ORAL | 0 refills | Status: AC

## 2018-07-21 MED ORDER — PROMETHAZINE-DM 6.25-15 MG/5ML PO SYRP: 5.0000 mL | ORAL_SOLUTION | Freq: Four times a day (QID) | ORAL | 0 refills | Status: AC | PRN

## 2018-07-21 NOTE — Progress Notes (Signed)
Subjective:  Patient ID: Trevor Griffin, male    DOB: 1955-10-06  Age: 63 y.o. MRN: [id number]  CC: Cough   HPI Trevor Griffin presents for a one-week history of cough productive of thick yellow phlegm with fever, chills, shortness of breath, back, and left chest wall pain.  Outpatient Medications Prior to Visit  Medication Sig Dispense Refill  . aspirin 81 MG tablet Take 1 tablet (81 mg total) by mouth daily. 30 tablet 1  . B Complex-C (B-COMPLEX WITH VITAMIN C) tablet Take 1 tablet daily by mouth. 30 tablet 0  . cyclobenzaprine (FLEXERIL) 5 MG tablet TAKE 1 TABLET BY MOUTH 2 TIMES DAILY AS NEEDED FOR MUSCLE SPASMS (Patient taking differently: Take 5 mg by mouth 2 (two) times daily as needed for muscle spasms. ) 60 tablet 3  . dronabinol (MARINOL) 2.5 MG capsule Take 1 capsule (2.5 mg total) by mouth 2 (two) times daily before lunch and supper. 60 capsule 0  . esomeprazole (NEXIUM) 20 MG capsule Take 20 mg by mouth daily.     . feeding supplement, ENSURE ENLIVE, (ENSURE ENLIVE) LIQD Take 237 mLs by mouth 4 (four) times daily. 120 Bottle 11  . Glycopyrrolate (LONHALA MAGNAIR REFILL KIT) 25 MCG/ML SOLN Inhale 1 Act into the lungs 2 (two) times daily. 60 mL 11  . levothyroxine (SYNTHROID, LEVOTHROID) 175 MCG tablet Take 1 tablet (175 mcg total) by mouth daily before breakfast. 90 tablet 0  . metoprolol tartrate (LOPRESSOR) 25 MG tablet Take 0.5 tablets (12.5 mg total) by mouth 2 (two) times daily. 180 tablet 1  . nicotine ([employer] NICOTINE TRANSDERMAL SYS) 14 mg/24hr patch APPLY 14MG PATCH DAILY X 6 WEEKS, THEN 7 MG PATCH DAILY X 2 WEEKS 14 patch 2  . nicotine (NICODERM CQ - DOSED IN MG/24 HR) 7 mg/24hr patch Apply 55m patch daily x 6 wk, then 7 mg patch daily x 2 wk 14 patch 0  . nitroGLYCERIN (NITROSTAT) 0.4 MG SL tablet Place 1 tablet (0.4 mg total) under the tongue every 5 (five) minutes as needed (up to 3 doses). For chest pain 25 tablet 3  . Oxycodone HCl 10 MG TABS Take 1 tablet (10 mg  total) by mouth 3 (three) times daily as needed. 90 tablet 0   No facility-administered medications prior to visit.     ROS Review of Systems  Constitutional: Positive for chills and fever. Negative for fatigue.  HENT: Negative for facial swelling and trouble swallowing.   Respiratory: Positive for cough and shortness of breath. Negative for chest tightness and wheezing.   Cardiovascular: Positive for chest pain. Negative for palpitations and leg swelling.  Gastrointestinal: Negative for abdominal pain, diarrhea, nausea and vomiting.  Endocrine: Negative.   Genitourinary: Negative.  Negative for difficulty urinating.  Musculoskeletal: Positive for back pain. Negative for myalgias and neck pain.  Skin: Negative for color change.  Neurological: Negative.  Negative for dizziness, weakness, light-headedness and headaches.  Hematological: Negative for adenopathy. Does not bruise/bleed easily.  Psychiatric/Behavioral: Negative.     Objective:  BP (!) 160/80 (BP Location: Left Arm, Patient Position: Sitting, Cuff Size: Normal)   Pulse 88   Temp (!) 97.4 F (36.3 C) (Oral)   Ht _0  (1.727 m)   Wt 169 lb (76.7 kg)   SpO2 99%   BMI 25.70 kg/m   BP Readings from Last 3 Encounters:  07/21/18 (!) 160/80  07/09/18 118/69  06/28/18 (!) 139/58    Wt Readings from Last 3 Encounters:  07/21/18 169 lb (76.7 kg)  07/09/18 167 lb 9.6 oz (76 kg)  06/28/18 167 lb 6.4 oz (75.9 kg)    Physical Exam Constitutional:      General: He is not in acute distress.    Appearance: He is ill-appearing. He is not toxic-appearing or diaphoretic.  HENT:     Nose: Nose normal. No congestion or rhinorrhea.     Mouth/Throat:     Mouth: Mucous membranes are moist.     Pharynx: Oropharynx is clear. No oropharyngeal exudate or posterior oropharyngeal erythema.  Eyes:     General: No scleral icterus.    Conjunctiva/sclera: Conjunctivae normal.  Neck:     Musculoskeletal: Normal range of motion and neck  supple.  Cardiovascular:     Rate and Rhythm: Normal rate and regular rhythm.     Heart sounds: No murmur. No gallop.   Pulmonary:     Effort: Pulmonary effort is normal. No respiratory distress.     Breath sounds: No stridor. No wheezing, rhonchi or rales.  Abdominal:     General: Abdomen is flat.     Palpations: There is no hepatomegaly, splenomegaly or mass.     Tenderness: There is no abdominal tenderness.  Musculoskeletal: Normal range of motion.        General: No swelling.     Right lower leg: No edema.     Left lower leg: No edema.  Lymphadenopathy:     Cervical: No cervical adenopathy.  Skin:    General: Skin is warm and dry.     Lab Results  Component Value Date   WBC 6.2 06/03/2018   HGB 13.1 06/03/2018   HCT 40.0 06/03/2018   PLT 157 06/03/2018   GLUCOSE 88 06/03/2018   CHOL 145 06/22/2018   TRIG 66.0 06/22/2018   HDL 72.20 06/22/2018   LDLDIRECT 148.4 10/18/2009   LDLCALC 59 06/22/2018   ALT 14 04/26/2018   AST 17 04/26/2018   NA 132 (L) 06/03/2018   K 4.1 06/03/2018   CL 99 06/03/2018   CREATININE 0.82 06/03/2018   BUN 7 (L) 06/03/2018   CO2 24 06/03/2018   TSH 11.93 (H) 06/22/2018   INR 1.05 01/15/2018   HGBA1C 4.2 (L) 04/16/2017    Dg Chest 2 View  Result Date: 07/21/2018 CLINICAL DATA:  Dry cough, sob, wheezing, chest pain AND back pain, fever, x 2 wks, HTN, x-smoker, Hx of COPD/asthma, rt lung ca, rt lung lobectomy, CAD, MI x 2 times. EXAM: CHEST - 2 VIEW COMPARISON:  06/03/2018 FINDINGS: Small pleural effusions, increased on the left since previous study. Minimal adjacent atelectasis in the lung bases. Increase in interstitial infiltrate in the lingula. Fiducial markers in the left mid lung as before. Heart size upper limits normal. No pneumothorax. Visualized bones unremarkable. IMPRESSION: Small pleural effusions, increased on the left since previous exam. Electronically Signed   By: Lucrezia Europe M.D.   On: 07/21/2018 09:34    Assessment & Plan:    Tamas was seen today for cough.  Diagnoses and all orders for this visit:  Lingular pneumonia -     amoxicillin-clavulanate (AUGMENTIN) 875-125 MG tablet; Take 1 tablet by mouth 2 (two) times daily for 10 days. -     promethazine-dextromethorphan (PROMETHAZINE-DM) 6.25-15 MG/5ML syrup; Take 5 mLs by mouth 4 (four) times daily as needed for cough.   I am having Trevor Griffin. Groninger start on amoxicillin-clavulanate and promethazine-dextromethorphan. I am also having him maintain his aspirin, nitroGLYCERIN, esomeprazole, B-complex with vitamin  C, metoprolol tartrate, Glycopyrrolate, feeding supplement (ENSURE ENLIVE), dronabinol, nicotine, cyclobenzaprine, nicotine, Oxycodone HCl, and levothyroxine.  Meds ordered this encounter  Medications  . amoxicillin-clavulanate (AUGMENTIN) 875-125 MG tablet    Sig: Take 1 tablet by mouth 2 (two) times daily for 10 days.    Dispense:  20 tablet    Refill:  0  . promethazine-dextromethorphan (PROMETHAZINE-DM) 6.25-15 MG/5ML syrup    Sig: Take 5 mLs by mouth 4 (four) times daily as needed for cough.    Dispense:  240 mL    Refill:  0     Follow-up: Return in about 3 weeks (around 08/11/2018).  Scarlette Calico, MD

## 2018-07-21 NOTE — Patient Instructions (Signed)
Community-Acquired Pneumonia, Adult  Pneumonia is an infection of the lungs. It causes swelling in the airways of the lungs. Mucus and fluid may also build up inside the airways.  One type of pneumonia can happen while a person is in a hospital. A different type can happen when a person is not in a hospital (community-acquired pneumonia).   What are the causes?    This condition is caused by germs (viruses, bacteria, or fungi). Some types of germs can be passed from one person to another. This can happen when you breathe in droplets from the cough or sneeze of an infected person.  What increases the risk?  You are more likely to develop this condition if you:   Have a long-term (chronic) disease, such as:  ? Chronic obstructive pulmonary disease (COPD).  ? Asthma.  ? Cystic fibrosis.  ? Congestive heart failure.  ? Diabetes.  ? Kidney disease.   Have HIV.   Have sickle cell disease.   Have had your spleen removed.   Do not take good care of your teeth and mouth (poor dental hygiene).   Have a medical condition that increases the risk of breathing in droplets from your own mouth and nose.   Have a weakened body defense system (immune system).   Are a smoker.   Travel to areas where the germs that cause this illness are common.   Are around certain animals or the places they live.  What are the signs or symptoms?   A dry cough.   A wet (productive) cough.   Fever.   Sweating.   Chest pain. This often happens when breathing deeply or coughing.   Fast breathing or trouble breathing.   Shortness of breath.   Shaking chills.   Feeling tired (fatigue).   Muscle aches.  How is this treated?  Treatment for this condition depends on many things. Most adults can be treated at home. In some cases, treatment must happen in a hospital. Treatment may include:   Medicines given by mouth or through an IV tube.   Being given extra oxygen.   Respiratory therapy.  In rare cases, treatment for very bad pneumonia  may include:   Using a machine to help you breathe.   Having a procedure to remove fluid from around your lungs.  Follow these instructions at home:  Medicines   Take over-the-counter and prescription medicines only as told by your doctor.  ? Only take cough medicine if you are losing sleep.   If you were prescribed an antibiotic medicine, take it as told by your doctor. Do not stop taking the antibiotic even if you start to feel better.  General instructions     Sleep with your head and neck raised (elevated). You can do this by sleeping in a recliner or by putting a few pillows under your head.   Rest as needed. Get at least 8 hours of sleep each night.   Drink enough water to keep your pee (urine) pale yellow.   Eat a healthy diet that includes plenty of vegetables, fruits, whole grains, low-fat dairy products, and lean protein.   Do not use any products that contain nicotine or tobacco. These include cigarettes, e-cigarettes, and chewing tobacco. If you need help quitting, ask your doctor.   Keep all follow-up visits as told by your doctor. This is important.  How is this prevented?  A shot (vaccine) can help prevent pneumonia. Shots are often suggested for:   People   older than 63 years of age.   People older than 63 years of age who:  ? Are having cancer treatment.  ? Have long-term (chronic) lung disease.  ? Have problems with their body's defense system.  You may also prevent pneumonia if you take these actions:   Get the flu (influenza) shot every year.   Go to the dentist as often as told.   Wash your hands often. If you cannot use soap and water, use hand sanitizer.  Contact a doctor if:   You have a fever.   You lose sleep because your cough medicine does not help.  Get help right away if:   You are short of breath and it gets worse.   You have more chest pain.   Your sickness gets worse. This is very serious if:  ? You are an older adult.  ? Your body's defense system is weak.   You  cough up blood.  Summary   Pneumonia is an infection of the lungs.   Most adults can be treated at home. Some will need treatment in a hospital.   Drink enough water to keep your pee pale yellow.   Get at least 8 hours of sleep each night.  This information is not intended to replace advice given to you by your health care provider. Make sure you discuss any questions you have with your health care provider.  Document Released: 10/08/2007 Document Revised: 12/17/2017 Document Reviewed: 12/17/2017  Elsevier Interactive Patient Education  2019 [employer] Inc[employer]

## 2018-07-29 ENCOUNTER — Telehealth: Payer: Self-pay | Admitting: Internal Medicine

## 2018-07-29 ENCOUNTER — Other Ambulatory Visit: Payer: Self-pay | Admitting: Internal Medicine

## 2018-07-29 DIAGNOSIS — G8929 Other chronic pain: Secondary | ICD-10-CM

## 2018-07-29 DIAGNOSIS — R0789 Other chest pain: Secondary | ICD-10-CM

## 2018-07-29 DIAGNOSIS — C7951 Secondary malignant neoplasm of bone: Secondary | ICD-10-CM

## 2018-07-29 DIAGNOSIS — G893 Neoplasm related pain (acute) (chronic): Secondary | ICD-10-CM

## 2018-07-29 MED ORDER — OXYCODONE HCL 10 MG PO TABS: 10.0000 mg | ORAL_TABLET | Freq: Three times a day (TID) | ORAL | 0 refills | Status: DC | PRN

## 2018-07-29 NOTE — Telephone Encounter (Signed)
Spoke to [place] (pt spouse) and she stated that pt has been in more pain than usual due to PNA and cancer. She stated that pt is out of medication. They are requesting early refill of ox[facility]codone 10mg  [facility] [address]

## 2018-07-29 NOTE — Telephone Encounter (Signed)
RX sent

## 2018-07-29 NOTE — Telephone Encounter (Signed)
Per Database, pt last filled rx on 07/06/2018 for #90  lvm for pt to call back to inform that it is too early to send refill request to provider.

## 2018-07-29 NOTE — Telephone Encounter (Signed)
Copied from [place] [id number]. Topic: Quick Communication - Rx Refill/Question >> Jul 29, 2018  3:18 PM Wynetta Emery, [place] C wrote: Medication:Oxycodone HCl 10 MG TABS   Has the patient contacted their pharmacy?  No  (Agent: If no, request that the patient contact the pharmacy for the refill.) (Agent: If yes, when and what did the pharmacy advise?)  Preferred Pharmacy (with phone number or street name): [id number] [address], [place]. [phone] (Phone) [phone] (Fax)    Agent: Please be advised that RX refills may take up to 3 business days. We ask that you follow-up with your pharmacy.

## 2018-08-02 ENCOUNTER — Other Ambulatory Visit: Payer: Self-pay | Admitting: Internal Medicine

## 2018-08-02 NOTE — Telephone Encounter (Signed)
Please advise:   Pt spouse is requesting refill on cyclobenzaprine 5 MG tablet and requesting Z pack antibiotic.

## 2018-08-02 NOTE — Telephone Encounter (Signed)
Copied from [place] [id number]. Topic: Quick Communication - Rx Refill/Question >> Aug 02, 2018  3:33 PM Celene Kras A wrote: Medication:patients wife called in stating pt is still experiencing rib pain, sob due to pneumonia, and requesting refill on cyclobenzaprine (FLEXERIL) 5 MG tablet [[id number]] and requesting Z pack antibiotic.   Has the patient contacted their pharmacy? No. (Agent: If no, request that the patient contact the pharmacy for the refill.) (Agent: If yes, when and what did the pharmacy advise?)  Preferred Pharmacy (with phone number or street name): [id number] [address], [place] [address] Phone: [phone] Fax: [phone] Not a 24 hour pharmacy; exact hours not known.    Agent: Please be advised that RX refills may take up to 3 business days. We ask that you follow-up with your pharmacy.

## 2018-08-03 ENCOUNTER — Other Ambulatory Visit: Payer: Self-pay | Admitting: Internal Medicine

## 2018-08-03 DIAGNOSIS — M5442 Lumbago with sciatica, left side: Secondary | ICD-10-CM

## 2018-08-03 DIAGNOSIS — J189 Pneumonia, unspecified organism: Secondary | ICD-10-CM

## 2018-08-03 DIAGNOSIS — G8929 Other chronic pain: Secondary | ICD-10-CM

## 2018-08-03 DIAGNOSIS — M5441 Lumbago with sciatica, right side: Secondary | ICD-10-CM

## 2018-08-03 MED ORDER — CYCLOBENZAPRINE HCL 5 MG PO TABS: 5.0000 mg | ORAL_TABLET | Freq: Two times a day (BID) | ORAL | 3 refills | Status: AC | PRN

## 2018-08-03 MED ORDER — AZITHROMYCIN 500 MG PO TABS: 500.0000 mg | ORAL_TABLET | Freq: Every day | ORAL | 0 refills | Status: AC

## 2018-08-03 NOTE — Telephone Encounter (Signed)
Pt informed of below.  

## 2018-08-03 NOTE — Telephone Encounter (Signed)
The prescriptions have been sent  [place]

## 2018-08-04 DIAGNOSIS — J90 Pleural effusion, not elsewhere classified: Secondary | ICD-10-CM

## 2018-08-04 HISTORY — DX: Pleural effusion, not elsewhere classified: J90

## 2018-08-09 ENCOUNTER — Telehealth: Payer: Self-pay | Admitting: *Deleted

## 2018-08-09 NOTE — Telephone Encounter (Signed)
CALLED DR. HARKINS' OFFICE TO ARRANGE A NERVE BLOCK FOR THIS PATIENT, LVM FOR  A RETURN CALL

## 2018-08-11 ENCOUNTER — Telehealth: Payer: Self-pay | Admitting: *Deleted

## 2018-08-11 NOTE — Telephone Encounter (Signed)
CALLED DR. HARKINS' OFFICE TO ARRANGE AN APPT. FOR A NERVE BLOCK FOR THIS PATIENT, WAS PUT IN DOMINIQUE VM, LVM FOR A RETURN CALL

## 2018-08-13 ENCOUNTER — Other Ambulatory Visit: Payer: Self-pay | Admitting: Internal Medicine

## 2018-08-13 ENCOUNTER — Ambulatory Visit: Payer: Self-pay | Admitting: Radiation Oncology

## 2018-08-13 ENCOUNTER — Ambulatory Visit: Payer: Self-pay | Admitting: *Deleted

## 2018-08-13 DIAGNOSIS — J449 Chronic obstructive pulmonary disease, unspecified: Secondary | ICD-10-CM

## 2018-08-13 NOTE — Telephone Encounter (Signed)
Patient is calling to report he is having SOB with exertion and even siting. It is worse at night. Patient reports he is coughing up yelloe/green sputum and it has ahd red in it. Patient does have cancer history which does put him at high risk. Advised ED- but patient does not want to go- patient just want O2 Rx.  Reason for Disposition . [1] MODERATE difficulty breathing (e.g., speaks in phrases, SOB even at rest, pulse 100-120) AND [2] NEW-onset or WORSE than normal  Answer Assessment - Initial Assessment Questions 1. RESPIRATORY STATUS: "Describe your breathing?" (e.g., wheezing, shortness of breath, unable to speak, severe coughing)      Hard to breath- can not walk from one room to another to another without getting out of breath 2. ONSET: "When did this breathing problem begin?"      Patient is under treatment for pneumonia- finished medications last week- patient does not feel like the treatment made a big difference 3. PATTERN "Does the difficult breathing come and go, or has it been constant since it started?"      Sob with movement- patient does breathing treatments- seem to help some 4. SEVERITY: "How bad is your breathing?" (e.g., mild, moderate, severe)    - MILD: No SOB at rest, mild SOB with walking, speaks normally in sentences, can lay down, no retractions, pulse < 100.    - MODERATE: SOB at rest, SOB with minimal exertion and prefers to sit, cannot lie down flat, speaks in phrases, mild retractions, audible wheezing, pulse 100-120.    - SEVERE: Very SOB at rest, speaks in single words, struggling to breathe, sitting hunched forward, retractions, pulse > 120      Moderate to severe- especially at night 5. RECURRENT SYMPTOM: "Have you had difficulty breathing before?" If so, ask: "When was the last time?" and "What happened that time?"      Not this bad 6. CARDIAC HISTORY: "Do you have any history of heart disease?" (e.g., heart attack, angina, bypass surgery, angioplasty)      Hx  heart attack 7. LUNG HISTORY: "Do you have any history of lung disease?"  (e.g., pulmonary embolus, asthma, emphysema)     Lung disease-cancer of tongue 8. CAUSE: "What do you think is causing the breathing problem?"      Hx pneumonia 9. OTHER SYMPTOMS: "Do you have any other symptoms? (e.g., dizziness, runny nose, cough, chest pain, fever)     cough- yellow/green- some red, chest pain- cancer,dizziness 10. PREGNANCY: "Is there any chance you are pregnant?" "When was your last menstrual period?"       n/a 11. TRAVEL: "Have you traveled out of the country in the last month?" (e.g., travel history, exposures)       No travel  Protocols used: BREATHING DIFFICULTY-A-AH

## 2018-08-16 ENCOUNTER — Other Ambulatory Visit: Payer: Self-pay | Admitting: Internal Medicine

## 2018-08-16 DIAGNOSIS — J449 Chronic obstructive pulmonary disease, unspecified: Secondary | ICD-10-CM

## 2018-08-16 MED ORDER — GLYCOPYRROLATE 25 MCG/ML IN SOLN: 1.0000 | Freq: Two times a day (BID) | RESPIRATORY_TRACT | 5 refills | Status: DC

## 2018-08-16 NOTE — Telephone Encounter (Signed)
Spoke to pt and he stated that he was feeling better.  Pt stated that he was worried about going to the ED. I reassured him that the ED screens and they are taking every precaution to keep all patients safe. I reminded pt that he was treated last month for pneumonia. Pt stated that he doesn't feel like it resolved. I further encouraged immediate medical attention and to not delay any further.  Pt stated by the end of conversation that he was feeling sob. Pt agreed to go to ED. Pt then stated that his wife was not feeling well either. I encouraged to not take breathing symptoms lightly.   Pt agreed to inform wife to take him to the ED.

## 2018-08-17 ENCOUNTER — Telehealth: Payer: Self-pay | Admitting: *Deleted

## 2018-08-17 NOTE — Telephone Encounter (Signed)
CALLED PATIENT TO INFORM THAT INFO. HAS BEEN FAXED  TO DR. HARKINS' OFFICE, AND I AM WAITING FOR A RETURN CALL WITH AN APPT. DATE AND TIME, I TOLD Trevor Griffin IF HE HASN'T HEARD ANYTHING BY Monday April 20 TO GIVE ME A CALL, PATIENT VERIFIED UNDERSTANDING THIS

## 2018-08-23 ENCOUNTER — Telehealth: Payer: Self-pay | Admitting: *Deleted

## 2018-08-23 NOTE — Telephone Encounter (Signed)
RETURNED PATIENT'S PHONE CALL, SPOKE WITH PATIENT. ?

## 2018-08-24 ENCOUNTER — Ambulatory Visit (INDEPENDENT_AMBULATORY_CARE_PROVIDER_SITE_OTHER)
Admission: EM | Admit: 2018-08-24 | Discharge: 2018-08-24 | Disposition: A | Discharge status: Home / Self Care | Payer: Medicare Other | Source: Home / Self Care | Attending: Family Medicine | Admitting: Family Medicine

## 2018-08-24 ENCOUNTER — Encounter (HOSPITAL_COMMUNITY): Payer: Self-pay | Admitting: Pharmacy Technician

## 2018-08-24 ENCOUNTER — Ambulatory Visit (INDEPENDENT_AMBULATORY_CARE_PROVIDER_SITE_OTHER): Payer: Medicare Other | Admitting: Internal Medicine

## 2018-08-24 ENCOUNTER — Telehealth: Payer: Self-pay | Admitting: Internal Medicine

## 2018-08-24 ENCOUNTER — Encounter: Payer: Self-pay | Admitting: Internal Medicine

## 2018-08-24 ENCOUNTER — Other Ambulatory Visit: Payer: Self-pay

## 2018-08-24 ENCOUNTER — Emergency Department (HOSPITAL_COMMUNITY): Payer: Medicare Other

## 2018-08-24 ENCOUNTER — Other Ambulatory Visit (HOSPITAL_COMMUNITY): Payer: Self-pay | Admitting: Internal Medicine

## 2018-08-24 ENCOUNTER — Ambulatory Visit (INDEPENDENT_AMBULATORY_CARE_PROVIDER_SITE_OTHER): Payer: Medicare Other

## 2018-08-24 ENCOUNTER — Encounter (HOSPITAL_COMMUNITY): Payer: Self-pay | Admitting: Emergency Medicine

## 2018-08-24 ENCOUNTER — Inpatient Hospital Stay (HOSPITAL_COMMUNITY)
Admission: EM | Admit: 2018-08-24 | Discharge: 2018-08-27 | DRG: 180 | Disposition: A | Discharge status: Home / Self Care | Payer: Medicare Other | Attending: Internal Medicine | Admitting: Internal Medicine

## 2018-08-24 DIAGNOSIS — C023 Malignant neoplasm of anterior two-thirds of tongue, part unspecified: Secondary | ICD-10-CM | POA: Diagnosis present

## 2018-08-24 DIAGNOSIS — G893 Neoplasm related pain (acute) (chronic): Secondary | ICD-10-CM | POA: Diagnosis present

## 2018-08-24 DIAGNOSIS — Z9861 Coronary angioplasty status: Secondary | ICD-10-CM

## 2018-08-24 DIAGNOSIS — J90 Pleural effusion, not elsewhere classified: Secondary | ICD-10-CM | POA: Diagnosis present

## 2018-08-24 DIAGNOSIS — R000 Tachycardia, unspecified: Secondary | ICD-10-CM | POA: Diagnosis present

## 2018-08-24 DIAGNOSIS — F329 Major depressive disorder, single episode, unspecified: Secondary | ICD-10-CM | POA: Diagnosis present

## 2018-08-24 DIAGNOSIS — Z923 Personal history of irradiation: Secondary | ICD-10-CM

## 2018-08-24 DIAGNOSIS — F419 Anxiety disorder, unspecified: Secondary | ICD-10-CM | POA: Diagnosis present

## 2018-08-24 DIAGNOSIS — I252 Old myocardial infarction: Secondary | ICD-10-CM

## 2018-08-24 DIAGNOSIS — J910 Malignant pleural effusion: Secondary | ICD-10-CM | POA: Diagnosis present

## 2018-08-24 DIAGNOSIS — I2510 Atherosclerotic heart disease of native coronary artery without angina pectoris: Secondary | ICD-10-CM

## 2018-08-24 DIAGNOSIS — I10 Essential (primary) hypertension: Secondary | ICD-10-CM | POA: Diagnosis not present

## 2018-08-24 DIAGNOSIS — Z79899 Other long term (current) drug therapy: Secondary | ICD-10-CM

## 2018-08-24 DIAGNOSIS — Z885 Allergy status to narcotic agent status: Secondary | ICD-10-CM

## 2018-08-24 DIAGNOSIS — C3412 Malignant neoplasm of upper lobe, left bronchus or lung: Secondary | ICD-10-CM | POA: Diagnosis not present

## 2018-08-24 DIAGNOSIS — R0602 Shortness of breath: Secondary | ICD-10-CM

## 2018-08-24 DIAGNOSIS — Z66 Do not resuscitate: Secondary | ICD-10-CM | POA: Diagnosis present

## 2018-08-24 DIAGNOSIS — E785 Hyperlipidemia, unspecified: Secondary | ICD-10-CM | POA: Diagnosis present

## 2018-08-24 DIAGNOSIS — Z87891 Personal history of nicotine dependence: Secondary | ICD-10-CM

## 2018-08-24 DIAGNOSIS — Z85810 Personal history of malignant neoplasm of tongue: Secondary | ICD-10-CM

## 2018-08-24 DIAGNOSIS — Z8249 Family history of ischemic heart disease and other diseases of the circulatory system: Secondary | ICD-10-CM

## 2018-08-24 DIAGNOSIS — Z7982 Long term (current) use of aspirin: Secondary | ICD-10-CM

## 2018-08-24 DIAGNOSIS — R05 Cough: Secondary | ICD-10-CM | POA: Diagnosis not present

## 2018-08-24 DIAGNOSIS — E43 Unspecified severe protein-calorie malnutrition: Secondary | ICD-10-CM | POA: Diagnosis not present

## 2018-08-24 DIAGNOSIS — G8929 Other chronic pain: Secondary | ICD-10-CM | POA: Diagnosis not present

## 2018-08-24 DIAGNOSIS — K219 Gastro-esophageal reflux disease without esophagitis: Secondary | ICD-10-CM | POA: Diagnosis present

## 2018-08-24 DIAGNOSIS — Z79890 Hormone replacement therapy (postmenopausal): Secondary | ICD-10-CM

## 2018-08-24 DIAGNOSIS — C7951 Secondary malignant neoplasm of bone: Secondary | ICD-10-CM | POA: Diagnosis present

## 2018-08-24 DIAGNOSIS — R0789 Other chest pain: Secondary | ICD-10-CM | POA: Diagnosis not present

## 2018-08-24 DIAGNOSIS — Z841 Family history of disorders of kidney and ureter: Secondary | ICD-10-CM

## 2018-08-24 DIAGNOSIS — I255 Ischemic cardiomyopathy: Secondary | ICD-10-CM | POA: Diagnosis not present

## 2018-08-24 DIAGNOSIS — Z6823 Body mass index (BMI) 23.0-23.9, adult: Secondary | ICD-10-CM

## 2018-08-24 DIAGNOSIS — J449 Chronic obstructive pulmonary disease, unspecified: Secondary | ICD-10-CM | POA: Diagnosis present

## 2018-08-24 DIAGNOSIS — F1010 Alcohol abuse, uncomplicated: Secondary | ICD-10-CM | POA: Diagnosis present

## 2018-08-24 DIAGNOSIS — Z515 Encounter for palliative care: Secondary | ICD-10-CM | POA: Diagnosis not present

## 2018-08-24 DIAGNOSIS — G43909 Migraine, unspecified, not intractable, without status migrainosus: Secondary | ICD-10-CM | POA: Diagnosis present

## 2018-08-24 DIAGNOSIS — Z888 Allergy status to other drugs, medicaments and biological substances status: Secondary | ICD-10-CM

## 2018-08-24 DIAGNOSIS — R0689 Other abnormalities of breathing: Secondary | ICD-10-CM | POA: Diagnosis not present

## 2018-08-24 DIAGNOSIS — Z8701 Personal history of pneumonia (recurrent): Secondary | ICD-10-CM

## 2018-08-24 DIAGNOSIS — E039 Hypothyroidism, unspecified: Secondary | ICD-10-CM | POA: Diagnosis present

## 2018-08-24 HISTORY — DX: Pleural effusion, not elsewhere classified: J90

## 2018-08-24 LAB — COMPREHENSIVE METABOLIC PANEL
ALT: 9 U/L (ref 0–44)
AST: 16 U/L (ref 15–41)
Albumin: 2.7 g/dL — ABNORMAL LOW (ref 3.5–5.0)
Alkaline Phosphatase: 84 U/L (ref 38–126)
Anion gap: 13 (ref 5–15)
BUN: <5 mg/dL — ABNORMAL LOW (ref 8–23)
CO2: 25 mmol/L (ref 22–32)
Calcium: 9.5 mg/dL (ref 8.9–10.3)
Chloride: 95 mmol/L — ABNORMAL LOW (ref 98–111)
Creatinine, Ser: 0.61 mg/dL (ref 0.61–1.24)
GFR calc Af Amer: >60 mL/min (ref >60)
GFR calc non Af Amer: >60 mL/min (ref >60)
Glucose, Bld: 80 mg/dL (ref 70–99)
Potassium: 4.1 mmol/L (ref 3.5–5.1)
Sodium: 133 mmol/L — ABNORMAL LOW (ref 135–145)
Total Bilirubin: 0.7 mg/dL (ref 0.3–1.2)
Total Protein: 7.2 g/dL (ref 6.5–8.1)

## 2018-08-24 LAB — CBC
HCT: 39.2 % (ref 39.0–52.0)
Hemoglobin: 12.8 g/dL — ABNORMAL LOW (ref 13.0–17.0)
MCH: 32.7 pg (ref 26.0–34.0)
MCHC: 32.7 g/dL (ref 30.0–36.0)
MCV: 100 fL (ref 80.0–100.0)
Platelets: 225 10*3/uL (ref 150–400)
RBC: 3.92 MIL/uL — ABNORMAL LOW (ref 4.22–5.81)
RDW: 14.7 % (ref 11.5–15.5)
WBC: 5.6 10*3/uL (ref 4.0–10.5)
nRBC: 0 % (ref 0.0–0.2)

## 2018-08-24 LAB — BRAIN NATRIURETIC PEPTIDE: B Natriuretic Peptide: 84.2 pg/mL (ref 0.0–100.0)

## 2018-08-24 LAB — TROPONIN I: Troponin I: <0.03 ng/mL (ref <0.03)

## 2018-08-24 MED ORDER — ASPIRIN EC 81 MG PO TBEC
81.0000 mg | DELAYED_RELEASE_TABLET | Freq: Every day | ORAL | Status: DC
  Administered 2018-08-25 – 2018-08-27 (×3): 81 mg via ORAL
  Filled 2018-08-24 (×4): qty 1

## 2018-08-24 MED ORDER — CYCLOBENZAPRINE HCL 10 MG PO TABS
5.0000 mg | ORAL_TABLET | Freq: Two times a day (BID) | ORAL | Status: DC | PRN
  Administered 2018-08-25 – 2018-08-27 (×4): 5 mg via ORAL
  Filled 2018-08-24 (×4): qty 1

## 2018-08-24 MED ORDER — SODIUM CHLORIDE 0.9% FLUSH
3.0000 mL | Freq: Two times a day (BID) | INTRAVENOUS | Status: DC
  Administered 2018-08-25 – 2018-08-26 (×3): 3 mL via INTRAVENOUS

## 2018-08-24 MED ORDER — ACETAMINOPHEN 325 MG PO TABS
650.0000 mg | ORAL_TABLET | Freq: Four times a day (QID) | ORAL | Status: DC | PRN
  Administered 2018-08-25: 650 mg via ORAL
  Filled 2018-08-24: qty 2

## 2018-08-24 MED ORDER — SODIUM CHLORIDE 0.9 % IV SOLN: 250.0000 mL | INTRAVENOUS | Status: DC | PRN

## 2018-08-24 MED ORDER — GLYCOPYRROLATE 25 MCG/ML IN SOLN: 1.0000 | Freq: Two times a day (BID) | RESPIRATORY_TRACT | Status: DC

## 2018-08-24 MED ORDER — ENSURE ENLIVE PO LIQD
1.0000 | Freq: Four times a day (QID) | ORAL | Status: DC
  Administered 2018-08-26 (×2): 237 mL via ORAL

## 2018-08-24 MED ORDER — SENNOSIDES-DOCUSATE SODIUM 8.6-50 MG PO TABS: 1.0000 | ORAL_TABLET | Freq: Every evening | ORAL | Status: DC | PRN

## 2018-08-24 MED ORDER — SODIUM CHLORIDE 0.9% FLUSH: 3.0000 mL | INTRAVENOUS | Status: DC | PRN

## 2018-08-24 MED ORDER — LEVOTHYROXINE SODIUM 75 MCG PO TABS
175.0000 ug | ORAL_TABLET | Freq: Every day | ORAL | Status: DC
  Administered 2018-08-25 – 2018-08-27 (×3): 175 ug via ORAL
  Filled 2018-08-24 (×3): qty 1

## 2018-08-24 MED ORDER — ACETAMINOPHEN 650 MG RE SUPP: 650.0000 mg | Freq: Four times a day (QID) | RECTAL | Status: DC | PRN

## 2018-08-24 MED ORDER — METOPROLOL TARTRATE 12.5 MG HALF TABLET
12.5000 mg | ORAL_TABLET | Freq: Two times a day (BID) | ORAL | Status: DC
  Administered 2018-08-25 (×2): 12.5 mg via ORAL
  Filled 2018-08-24 (×2): qty 1

## 2018-08-24 MED ORDER — PANTOPRAZOLE SODIUM 40 MG PO TBEC
40.0000 mg | DELAYED_RELEASE_TABLET | Freq: Every day | ORAL | Status: DC
  Administered 2018-08-25 – 2018-08-27 (×3): 40 mg via ORAL
  Filled 2018-08-24 (×3): qty 1

## 2018-08-24 MED ORDER — ONDANSETRON HCL 4 MG/2ML IJ SOLN: 4.0000 mg | Freq: Four times a day (QID) | INTRAMUSCULAR | Status: DC | PRN

## 2018-08-24 MED ORDER — UMECLIDINIUM BROMIDE 62.5 MCG/INH IN AEPB
1.0000 | INHALATION_SPRAY | Freq: Every day | RESPIRATORY_TRACT | Status: DC
  Administered 2018-08-25 – 2018-08-27 (×2): 1 via RESPIRATORY_TRACT
  Filled 2018-08-24: qty 7

## 2018-08-24 MED ORDER — ONDANSETRON HCL 4 MG PO TABS: 4.0000 mg | ORAL_TABLET | Freq: Four times a day (QID) | ORAL | Status: DC | PRN

## 2018-08-24 MED ORDER — OXYCODONE HCL 5 MG PO TABS
10.0000 mg | ORAL_TABLET | Freq: Three times a day (TID) | ORAL | Status: DC | PRN
  Administered 2018-08-25 – 2018-08-27 (×7): 10 mg via ORAL
  Filled 2018-08-24 (×10): qty 2

## 2018-08-24 NOTE — ED Provider Notes (Signed)
[facility] Emergency Department Provider Note MRN:  [id number]  Arrival date & time: 08/24/18     Chief Complaint   Shortness of Breath   History of Present Illness   Trevor Griffin is a 63 y.o. year-old male with a history of COPD, CAD, head and neck cancer presenting to the ED with chief complaint of shortness of breath.  Patient has been told that he has a lung effusion and was sent here to be admitted and have it drained.  Endorsing gradual worsening shortness of breath for the past few weeks, now feels short of breath at rest, trouble getting around the house.  Endorsing left-sided rib pain which is chronic.  Denies fever, no cough, no abdominal pain, no numbness or weakness to the arms or legs.  Review of Systems  A complete 10 system review of systems was obtained and all systems are negative except as noted in the HPI and PMH.   Patient's Health History    Past Medical History:  Diagnosis Date  . Acute ST elevation myocardial infarction (STEMI) involving left anterior descending (LAD) coronary artery ([place]) 2008 and 2009   5/'08 - DES x 4 to p-m LAD; 2/'09 - PTCA of  in-stent thrombosis  . Alcohol abuse   . Anxiety   . CAD S/P percutaneous coronary angioplasty 2008   a. Ant STEMI 5/08 w/ 4 Taxus DES p-m LAD (3.0 x 16,16,24,28 mm) ;  b. 2/'09:  Stopped Plavix-> Ant STEMI/stent thrombosis in LAD (PTCA - 3.8 mm), LCX 20m/d;  c. 10/12: Cath/PCI: p-m LAD stents 10-20% ISR, mLAD 40%, CFX 30-40%, mCFX 90% (Promus Premier DES 3.0 mm x 20 mm - 3.52mm ), EF 35%; d. cath 2/17/'16 no Sig CAD, patent LAD & Cx stents, EF 40-45%  . Chronic pain   . COPD (chronic obstructive pulmonary disease) ([place])   . Depression   . Fatigue    Sleep study 6/08 was negative. Sleep study 5/13 showed no OSA.  Marland Kitchen GERD (gastroesophageal reflux disease)   . Headache(784.0)    migraines  . History of radiation therapy 05/27/2018- 06/02/2018   Left 6th rib tumor, 5 Gy X 5 fractions for a total  dose of 25 Gy.   Marland Kitchen Hx of radiation therapy 10/25/12-12/10/12    Right Tongue/ Bilateral Neck //Total Dose, 60 Gy in 30 Fractions  . Hyperlipidemia   . Hypertension   . Ischemic cardiomyopathy    a. Echo 08/20/11 with EF 50-55% (previously 35%);  b. 02/2014 Echo: EF 45-50-%, mid-apicalanteroseptal AK, Gr 1 dd, mild AI, midlly dil LA.  . Lung cancer ([place]) dx'd 2015  . Obesity   . Tobacco abuse   . 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 Umbilical hernia     Past Surgical History:  Procedure Laterality Date  . COLONOSCOPY N/A 11/25/2016   Procedure: COLONOSCOPY;  Surgeon: Clarene Essex, MD;  Location: [facility];  Service: Endoscopy;  Laterality: N/A;  . CORONARY ANGIOPLASTY WITH STENT PLACEMENT  2008-2012   a. 5/'08: Anterior STEMI: Proximal-mid LAD PCI: 4 total Taxus DES stents 3.0 mm x  16 mm, 16 mm, 24 mm and 28 mm.( post-dil 3.5 mm);; b. 2/'09: PTCA LAD in-stent thrombosis - 3.8 mm;; c. 10/'12: mCx Promus Element DES 3.0 x 20 mm- post-dil - 3.25 mm. -> patent in 2016  . ESOPHAGOGASTRODUODENOSCOPY (EGD) WITH PROPOFOL N/A 11/25/2016   Procedure: ESOPHAGOGASTRODUODENOSCOPY (EGD) WITH PROPOFOL;  Surgeon: Clarene Essex, MD;  Location: [facility];  Service: Endoscopy;  Laterality: N/A;  neonatal scope  . FRACTURE SURGERY Right    as a child,leg  . FUDUCIAL PLACEMENT N/A 01/15/2018   Procedure: PLACEMENT OF FUDUCIAL;  Surgeon: Melrose Nakayama, MD;  Location: [place];  Service: Thoracic;  Laterality: N/A;  . LEFT HEART CATH AND CORONARY ANGIOGRAPHY  2006-2012   a) 2006: minimal CAD, normal EF;; b) 5/'08 - Ant STEMI - 100% p-mLAD (DES x 4 in LAD);; c)2/'09: Ant STEMI for IST/thrombosis of LAD ->PTCA, 80% mCx;; 10/'12: mCx 80-90% - DES PCI, patent LAD stents, EF ~35% - anterolateral AK, apical severe HK.  Marland Kitchen LEFT HEART CATHETERIZATION WITH CORONARY ANGIOGRAM N/A 06/21/2014   Procedure: LEFT HEART CATHETERIZATION WITH CORONARY ANGIOGRAM;  Surgeon: Burnell Blanks, MD;   Location: [facility] CATH LAB: No evidence of obstructive disease.  Patent LAD and circumflex stents.  EF 40-45%.  . LOBECTOMY Right 06/06/2013   Procedure: LOBECTOMY;  Surgeon: Melrose Nakayama, MD;  Location: [place];  Service: Thoracic;  Laterality: Right;  . TRANSTHORACIC ECHOCARDIOGRAM  02/2014   EF 45-50%.  Akinesis of the mid apical anteroseptal wall.  GR 1 DD.  Mild MR.  . VIDEO ASSISTED THORACOSCOPY (VATS)/WEDGE RESECTION Right 06/06/2013   Procedure: VIDEO ASSISTED THORACOSCOPY (VATS)/WEDGE RESECTION;  Surgeon: Melrose Nakayama, MD;  Location: [place];  Service: Thoracic;  Laterality: Right;  Marland Kitchen VIDEO BRONCHOSCOPY WITH ENDOBRONCHIAL NAVIGATION N/A 01/15/2018   Procedure: VIDEO BRONCHOSCOPY WITH ENDOBRONCHIAL NAVIGATION;  Surgeon: Melrose Nakayama, MD;  Location: [facility] OR;  Service: Thoracic;  Laterality: N/A;    Family History  Problem Relation Age of Onset  . Kidney disease Mother        CHRONIC  . Cancer Sister   . Liver disease Sister        LIVER FAILURE  . Heart attack Brother     Social History   Socioeconomic History  . Marital status: Married    Spouse name: Not on file  . Number of children: Not on file  . Years of education: Not on file  . Highest education level: Not on file  Occupational History  . Occupation: Disabled  Social Needs  . Financial resource strain: Not on file  . Food insecurity:    Worry: Not on file    Inability: Not on file  . Transportation needs:    Medical: No    Non-medical: No  Tobacco Use  . Smoking status: Former Smoker    Years: 46.00    Types: Cigars  . Smokeless tobacco: Never Used  . Tobacco comment: He has quit smoking recently, he is using a patch  Substance and Sexual Activity  . Alcohol use: Yes    Alcohol/week: 3.0 standard drinks    Types: 3 Cans of beer per week    Comment: 3 beers 4 days a week  . Drug use: No  . Sexual activity: Yes  Lifestyle  . Physical activity:    Days per week: 7 days    Minutes per session:  20 min  . Stress: Not on file  Relationships  . Social connections:    Talks on phone: Not on file    Gets together: Not on file    Attends religious service: Not on file    Active member of club or organization: Not on file    Attends meetings of clubs or organizations: Not on file    Relationship status: Not on file  . Intimate partner violence:  Fear of current or ex partner: No    Emotionally abused: No    Physically abused: No    Forced sexual activity: No  Other Topics Concern  . Not on file  Social History Narrative   Alcoholic beverage:  Yes   Drug use:  No   Seatbead Use: Yes   Firearms in home:  Yes   Exercise:  No   Smoke Alarm in your home: Yes     Physical Exam  Vital Signs and Nursing Notes reviewed Vitals:   08/24/18 2130 08/24/18 2226  BP: (!) 149/83 (!) 161/84  Pulse: (!) 103 (!) 110  Resp: (!) 23 (!) 25  Temp:  (!) 96.7 F (35.9 C)  SpO2: 98% 96%    CONSTITUTIONAL: Chronically ill-appearing, NAD NEURO:  Alert and oriented x 3, no focal deficits EYES:  eyes equal and reactive ENT/NECK:  no LAD, no JVD CARDIO: Tachycardic rate, well-perfused, normal S1 and S2 PULM: Decreased left-sided breath sounds GI/GU:  normal bowel sounds, non-distended, non-tender MSK/SPINE:  No gross deformities, no edema SKIN:  no rash, atraumatic PSYCH:  Appropriate speech and behavior  Diagnostic and Interventional Summary    EKG Interpretation  Date/Time:  Tuesday August 24 2018 17:48:52 EDT Ventricular Rate:  103 PR Interval:    QRS Duration: 88 QT Interval:  304 QTC Calculation: 398 R Axis:   28 Text Interpretation:  Sinus tachycardia Probable left atrial enlargement Abnormal R-wave progression, early transition Borderline repolarization abnormality Confirmed by Gerlene Fee [phone]) on 08/24/2018 6:41:21 PM      Labs Reviewed  CBC - Abnormal; Notable for the following components:      Result Value   RBC 3.92 (*)    Hemoglobin 12.8 (*)    All other  components within normal limits  COMPREHENSIVE METABOLIC PANEL - Abnormal; Notable for the following components:   Sodium 133 (*)    Chloride 95 (*)    BUN <5 (*)    Albumin 2.7 (*)    All other components within normal limits  TROPONIN I  BRAIN NATRIURETIC PEPTIDE  HIV ANTIBODY (ROUTINE TESTING W REFLEX)  COMPREHENSIVE METABOLIC PANEL  CBC WITH DIFFERENTIAL/PLATELET    DG Chest Port 1 View  Final Result    IR THORACENTESIS ASP PLEURAL SPACE W/IMG GUIDE    (Results Pending)    Medications  aspirin EC tablet 81 mg (has no administration in time range)  oxyCODONE (Oxy IR/ROXICODONE) immediate release tablet 10 mg (has no administration in time range)  metoprolol tartrate (LOPRESSOR) tablet 12.5 mg (has no administration in time range)  levothyroxine (SYNTHROID) tablet 175 mcg (has no administration in time range)  pantoprazole (PROTONIX) EC tablet 40 mg (has no administration in time range)  cyclobenzaprine (FLEXERIL) tablet 5 mg (has no administration in time range)  feeding supplement (ENSURE ENLIVE) (ENSURE ENLIVE) liquid 237 mL (has no administration in time range)  sodium chloride flush (NS) 0.9 % injection 3 mL (has no administration in time range)  sodium chloride flush (NS) 0.9 % injection 3 mL (has no administration in time range)  0.9 %  sodium chloride infusion (has no administration in time range)  acetaminophen (TYLENOL) tablet 650 mg (has no administration in time range)    Or  acetaminophen (TYLENOL) suppository 650 mg (has no administration in time range)  senna-docusate (Senokot-S) tablet 1 tablet (has no administration in time range)  ondansetron (ZOFRAN) tablet 4 mg (has no administration in time range)    Or  ondansetron (ZOFRAN) injection 4  mg (has no administration in time range)  umeclidinium bromide (INCRUSE ELLIPTA) 62.5 MCG/INH 1 puff (has no administration in time range)     Procedures Critical Care  ED Course and Medical Decision Making  I have  reviewed the triage vital signs and the nursing notes.  Pertinent labs & imaging results that were available during my care of the patient were reviewed by me and considered in my medical decision making (see below for details).  Patient's PCP attempted an outpatient CT guided thoracentesis but was unable to obtain this.  Patient's PCP wishing that he be admitted for this procedure.  This seems to be appropriate plan for the patient given his discomfort and poor functional abilities at this time.  Patient is with mild tachycardia but otherwise only mild increased work of breathing, awaiting troponin and then will admit to hospitalist service.  Clinical Course as of Aug 24 2335  Tue Aug 24, 2018  2057 Patient's symptoms seem well explained by his likely malignant pleural effusion.  Little to no concern for pulmonary embolism at this time.  Will call for admission.   [MB]    Clinical Course User Index [MB] Maudie Flakes, MD     Admitted to hospital service for further care.  Barth Kirks. Sedonia Small, MD [place] [email or web address]  Final Clinical Impressions(s) / ED Diagnoses     ICD-10-CM   1. SOB (shortness of breath) R06.02 DG Chest [facility] 1 View    DG Chest [place] 1 View  2. Pleural effusion, left J90 IR THORACENTESIS ASP PLEURAL SPACE W/IMG GUIDE    IR THORACENTESIS ASP PLEURAL SPACE [facility] GUIDE    ED Discharge Orders    None         Maudie Flakes, MD 08/24/18 2338

## 2018-08-24 NOTE — Discharge Instructions (Addendum)
BP 108/77 (BP Location: Left Arm)    Pulse 88    Temp (!) 97.2 F (36.2 C) (Axillary)    Resp 18    SpO2 98%

## 2018-08-24 NOTE — ED Notes (Signed)
Trevor Griffin [phone] pts wife wants an update as soon as possible please

## 2018-08-24 NOTE — ED Triage Notes (Signed)
Sob since diagnosis of pneumonia in January 2020 History of cancer, current ribs is location.   Chemo and radiation completed Patient has a cough, minimal phlegm production-cough is no different than usual Denies fever  Sob is worsening

## 2018-08-24 NOTE — H&P (Signed)
History and Physical    Desi [facility] HTX:[id number] DOB: 1955-05-30 DOA: 08/24/2018  PCP: Janith Lima, MD   Patient coming from: Home   Chief Complaint: SOB  HPI: Trevor Griffin is a 63 y.o. male with medical history significant for tongue cancer, metastatic lung cancer, COPD, coronary artery disease, ischemic cardiomyopathy, chronic cancer related pain, and hypothyroidism, now presenting to emergency department for evaluation of shortness of breath and increased left pleural effusion noted on outpatient imaging.  Patient reports insidiously worsening dyspnea over the past 6 weeks or so, now only able to walk several steps.  He has not had any fevers, reports occasional cough, and denies any lower extremity swelling or tenderness.  He was treated with Augmentin for possible pneumonia last month, but did not initiate any improvement with this.  He was evaluated for his progressive dyspnea at urgent care, noted to have increase in his left sided pleural effusion, and was recommended to follow-up with his PCP who was able to arrange outpatient thoracentesis, but he cannot be performed for a couple days and so he was advised to go to the ED to see if the procedure could be expidited.  ED Course: Upon arrival to the ED, patient is found to be afebrile, saturating mid 90s on room air, slightly tachypneic, and with stable blood pressure.  EKG features sinus tachycardia with rate 103.  Chest x-ray is notable for moderate left sided pleural effusion and left lower lobe atelectasis or infiltrate, both increased from the prior study.  Chemistry panel is notable for albumin of 2.7 and sodium 133.  CBC is unremarkable.  Troponin is undetectable and BNP normal.  ED physician requests admission for therapeutic thoracentesis.  Review of Systems:  All other systems reviewed and apart from HPI, are negative.  Past Medical History:  Diagnosis Date   Acute ST elevation myocardial infarction (STEMI)  involving left anterior descending (LAD) coronary artery ([place]) 2008 and 2009   5/'08 - DES x 4 to p-m LAD; 2/'09 - PTCA of  in-stent thrombosis   Alcohol abuse    Anxiety    CAD S/P percutaneous coronary angioplasty 2008   a. Ant STEMI 5/08 w/ 4 Taxus DES p-m LAD (3.0 x 16,16,24,28 mm) ;  b. 2/'09:  Stopped Plavix-> Ant STEMI/stent thrombosis in LAD (PTCA - 3.8 mm), LCX 58md;  c. 10/12: Cath/PCI: p-m LAD stents 10-20% ISR, mLAD 40%, CFX 30-40%, mCFX 90% (Promus Premier DES 3.0 mm x 20 mm - 3.277m), EF 35%; d. cath 2/17/'16 no Sig CAD, patent LAD & Cx stents, EF 40-45%   Chronic pain    COPD (chronic obstructive pulmonary disease) (HCC)    Depression    Fatigue    Sleep study 6/08 was negative. Sleep study 5/13 showed no OSA.   GERD (gastroesophageal reflux disease)    Headache(784.0)    migraines   History of radiation therapy 05/27/2018- 06/02/2018   Left 6th rib tumor, 5 Gy X 5 fractions for a total dose of 25 Gy.    Hx of radiation therapy 10/25/12-12/10/12    Right Tongue/ Bilateral Neck //Total Dose, 60 Gy in 30 Fractions   Hyperlipidemia    Hypertension    Ischemic cardiomyopathy    a. Echo 08/20/11 with EF 50-55% (previously 35%);  b. 02/2014 Echo: EF 45-50-%, mid-apicalanteroseptal AK, Gr 1 dd, mild AI, midlly dil LA.   [place] [place]   Obesity    Tobacco abuse    Tongue cancer (  [place]) dx'd 2014   s/p resection - [place]   Umbilical hernia     Past Surgical History:  Procedure Laterality Date   COLONOSCOPY N/A 11/25/2016   Procedure: COLONOSCOPY;  Surgeon: Clarene Essex, MD;  Location: [facility];  Service: Endoscopy;  Laterality: N/A;   CORONARY ANGIOPLASTY WITH STENT PLACEMENT  2008-2012   a. 5/'08: Anterior STEMI: Proximal-mid LAD PCI: 4 total Taxus DES stents 3.0 mm x  16 mm, 16 mm, 24 mm and 28 mm.( post-dil 3.5 mm);; b. 2/'09: PTCA LAD in-stent thrombosis - 3.8 mm;; c. 10/'12: mCx Promus Element DES 3.0 x 20 mm- post-dil - 3.25 mm. -> patent  in 2016   ESOPHAGOGASTRODUODENOSCOPY (EGD) WITH PROPOFOL N/A 11/25/2016   Procedure: ESOPHAGOGASTRODUODENOSCOPY (EGD) WITH PROPOFOL;  Surgeon: Clarene Essex, MD;  Location: [facility];  Service: Endoscopy;  Laterality: N/A;  neonatal scope   FRACTURE SURGERY Right    as a child,leg   FUDUCIAL PLACEMENT N/A 01/15/2018   Procedure: PLACEMENT OF FUDUCIAL;  Surgeon: Melrose Nakayama, MD;  Location: [place];  Service: Thoracic;  Laterality: N/A;   LEFT HEART CATH AND CORONARY ANGIOGRAPHY  2006-2012   a) 2006: minimal CAD, normal EF;; b) 5/'08 - Ant STEMI - 100% p-mLAD (DES x 4 in LAD);; c)2/'09: Ant STEMI for IST/thrombosis of LAD ->PTCA, 80% mCx;; 10/'12: mCx 80-90% - DES PCI, patent LAD stents, EF ~35% - anterolateral AK, apical severe HK.   LEFT HEART CATHETERIZATION WITH CORONARY ANGIOGRAM N/A 06/21/2014   Procedure: LEFT HEART CATHETERIZATION WITH CORONARY ANGIOGRAM;  Surgeon: Burnell Blanks, MD;  Location: [facility] CATH LAB: No evidence of obstructive disease.  Patent LAD and circumflex stents.  EF 40-45%.   LOBECTOMY Right 06/06/2013   Procedure: LOBECTOMY;  Surgeon: Melrose Nakayama, MD;  Location: [place];  Service: Thoracic;  Laterality: Right;   TRANSTHORACIC ECHOCARDIOGRAM  02/2014   EF 45-50%.  Akinesis of the mid apical anteroseptal wall.  GR 1 DD.  Mild MR.   VIDEO ASSISTED THORACOSCOPY (VATS)/WEDGE RESECTION Right 06/06/2013   Procedure: VIDEO ASSISTED THORACOSCOPY (VATS)/WEDGE RESECTION;  Surgeon: Melrose Nakayama, MD;  Location: [place];  Service: Thoracic;  Laterality: Right;   VIDEO BRONCHOSCOPY WITH ENDOBRONCHIAL NAVIGATION N/A 01/15/2018   Procedure: VIDEO BRONCHOSCOPY WITH ENDOBRONCHIAL NAVIGATION;  Surgeon: Melrose Nakayama, MD;  Location: [place];  Service: Thoracic;  Laterality: N/A;     reports that he has quit smoking. His smoking use included cigars. He quit after 46.00 years of use. He has never used smokeless tobacco. He reports current alcohol use of about  3.0 standard drinks of alcohol per week. He reports that he does not use drugs.  Allergies  Allergen Reactions   Hydromorphone Nausea Only and Other (See Comments)    Headaches also   Nubain [Nalbuphine Hcl] Nausea Only and Other (See Comments)    Headaches also    Family History  Problem Relation Age of Onset   Kidney disease Mother        CHRONIC   Cancer Sister    Liver disease Sister        LIVER FAILURE   Heart attack Brother      Prior to Admission medications   Medication Sig Start Date End Date Taking? Authorizing Provider  aspirin 81 MG tablet Take 1 tablet (81 mg total) by mouth daily. 02/25/14  Yes Theora Gianotti, NP  B Complex-C (B-COMPLEX WITH VITAMIN C) tablet Take 1 tablet daily by mouth. 03/22/17  Yes Lavina Hamman,  MD  cyclobenzaprine (FLEXERIL) 5 MG tablet Take 1 tablet (5 mg total) by mouth 2 (two) times daily as needed for muscle spasms. 08/03/18  Yes Janith Lima, MD  dronabinol (MARINOL) 2.5 MG capsule Take 1 capsule (2.5 mg total) by mouth 2 (two) times daily before lunch and supper. 12/22/17  Yes Janith Lima, MD  esomeprazole (NEXIUM) 20 MG capsule Take 20 mg by mouth daily.    Yes [provider]  Glycopyrrolate (LONHALA MAGNAIR REFILL KIT) 25 MCG/ML SOLN Inhale 1 puff into the lungs 2 (two) times daily. 08/16/18  Yes Janith Lima, MD  levothyroxine (SYNTHROID, LEVOTHROID) 175 MCG tablet Take 1 tablet (175 mcg total) by mouth daily before breakfast. 06/22/18  Yes Janith Lima, MD  metoprolol tartrate (LOPRESSOR) 25 MG tablet Take 0.5 tablets (12.5 mg total) by mouth 2 (two) times daily. 04/16/17  Yes Janith Lima, MD  nitroGLYCERIN (NITROSTAT) 0.4 MG SL tablet Place 1 tablet (0.4 mg total) under the tongue every 5 (five) minutes as needed (up to 3 doses). For chest pain 02/25/14  Yes Theora Gianotti, NP  Oxycodone HCl 10 MG TABS Take 1 tablet (10 mg total) by mouth 3 (three) times daily as needed. Patient  taking differently: Take 10 mg by mouth 3 (three) times daily as needed (pain).  07/29/18  Yes Janith Lima, MD  promethazine-dextromethorphan (PROMETHAZINE-DM) 6.25-15 MG/5ML syrup Take 5 mLs by mouth 4 (four) times daily as needed for cough. 07/21/18  Yes Janith Lima, MD  feeding supplement, ENSURE ENLIVE, (ENSURE ENLIVE) LIQD Take 237 mLs by mouth 4 (four) times daily. 04/16/17   Janith Lima, MD  Glycopyrrolate (LONHALA MAGNAIR STARTER KIT) 25 MCG/ML SOLN Inhale 1 puff into the lungs 2 (two) times daily with a meal. Patient not taking: Reported on 08/24/2018 08/16/18   Janith Lima, MD  nicotine ([employer] NICOTINE TRANSDERMAL SYS) 14 mg/24hr patch APPLY 14MG PATCH DAILY X 6 WEEKS, THEN 7 MG PATCH DAILY X 2 WEEKS Patient not taking: Reported on 08/24/2018 06/04/18   [name], MD  nicotine (NICODERM CQ - DOSED IN MG/24 HR) 7 mg/24hr patch Apply 76m patch daily x 6 wk, then 7 mg patch daily x 2 wk Patient not taking: Reported on 08/24/2018 02/09/18   SEppie Gibson MD    Physical Exam: Vitals:   08/24/18 1845 08/24/18 1900 08/24/18 2000 08/24/18 2030  BP:  123/84 (!) 143/77 138/81  Pulse: 97 98 99 (!) 102  Resp: (!) 23 17 (!) 22 (!) 22  Temp:      TempSrc:      SpO2: 98% 96% 97% 94%  Weight:      Height:        Constitutional: NAD, calm  Eyes: PERTLA, lids and conjunctivae normal ENMT: Mucous membranes are moist. Posterior pharynx clear of any exudate or lesions.   Neck: normal, supple, no masses, no thyromegaly Respiratory: diminished at bilateral bases, no wheezing, no crackles. No accessory muscle use.  Cardiovascular: S1 & S2 heard, regular rate and rhythm. No extremity edema.   Abdomen: No distension, no tenderness, soft. Bowel sounds active.  Musculoskeletal: no clubbing / cyanosis. No joint deformity upper and lower extremities.    Skin: no significant rashes, lesions, ulcers. Warm, dry, well-perfused. Neurologic: CN II-XII grossly intact. Sensation intact. Moving all  extremities.  Psychiatric: Alert and oriented x 3. Pleasant and cooperative.    Labs on Admission: I have personally reviewed following labs and imaging studies  CBC:  Recent Labs  Lab 08/24/18 1911  WBC 5.6  HGB 12.8*  HCT 39.2  MCV 100.0  PLT 270   Basic Metabolic Panel: Recent Labs  Lab 08/24/18 1911  NA 133*  K 4.1  CL 95*  CO2 25  GLUCOSE 80  BUN <5*  CREATININE 0.61  CALCIUM 9.5   GFR: Estimated Creatinine Clearance: 92.6 mL/min (by C-G formula based on SCr of 0.61 mg/dL). Liver Function Tests: Recent Labs  Lab 08/24/18 1911  AST 16  ALT 9  ALKPHOS 84  BILITOT 0.7  PROT 7.2  ALBUMIN 2.7*   No results for input(s): LIPASE, AMYLASE in the last 168 hours. No results for input(s): AMMONIA in the last 168 hours. Coagulation Profile: No results for input(s): INR, PROTIME in the last 168 hours. Cardiac Enzymes: Recent Labs  Lab 08/24/18 1911  TROPONINI <0.03   BNP (last 3 results) No results for input(s): PROBNP in the last 8760 hours. HbA1C: No results for input(s): HGBA1C in the last 72 hours. CBG: No results for input(s): GLUCAP in the last 168 hours. Lipid Profile: No results for input(s): CHOL, HDL, LDLCALC, TRIG, CHOLHDL, LDLDIRECT in the last 72 hours. Thyroid Function Tests: No results for input(s): TSH, T4TOTAL, FREET4, T3FREE, THYROIDAB in the last 72 hours. Anemia Panel: No results for input(s): VITAMINB12, FOLATE, FERRITIN, TIBC, IRON, RETICCTPCT in the last 72 hours. Urine analysis:    Component Value Date/Time   COLORURINE YELLOW 02/23/2014 [place] 02/23/2014 1707   LABSPEC 1.005 02/23/2014 1707   PHURINE 7.0 02/23/2014 1707   GLUCOSEU NEGATIVE 02/23/2014 1707   HGBUR NEGATIVE 02/23/2014 1707   BILIRUBINUR NEGATIVE 02/23/2014 1707   KETONESUR NEGATIVE 02/23/2014 1707   PROTEINUR NEGATIVE 02/23/2014 1707   UROBILINOGEN 0.2 02/23/2014 1707   NITRITE NEGATIVE 02/23/2014 1707   LEUKOCYTESUR NEGATIVE 02/23/2014  1707   Sepsis Labs: @LABRCNTIP (procalcitonin:4,lacticidven:4) )No results found for this or any previous visit (from the past 240 hour(s)).   Radiological Exams on Admission: Dg Chest 2 View  Result Date: 08/24/2018 CLINICAL DATA:  63 year old with current history of metastatic LEFT UPPER LOBE lung cancer, presenting with chronic persistent shortness of breath for at least 2 months, associated with productive cough. EXAM: CHEST - 2 VIEW COMPARISON:  07/21/2018 and earlier, including CT chest 06/03/2018. FINDINGS: Since the examination 1 month ago, interval increase in size of the now moderate to large LEFT pleural effusion and development of consolidation in the LEFT LOWER LOBE. Cardiac silhouette moderately enlarged, unchanged. Pulmonary vascularity normal without evidence of pulmonary edema. Stable chronic pleuroparenchymal scarring at the RIGHT lung base. RIGHT lung otherwise clear. Metallic fiducial markers in the LEFT UPPER LOBE as previously noted. The previously identified LEFT LATERAL sixth rib fracture is not conspicuous. IMPRESSION: 1. Since the examination 1 month ago, increase in size of the now moderate to large LEFT pleural effusion and associated passive atelectasis and/or pneumonia involving the LEFT LOWER LOBE. 2. Stable cardiomegaly without evidence of pulmonary edema. Electronically Signed   By: Evangeline Dakin M.D.   On: 08/24/2018 12:34   Dg Chest Port 1 View  Result Date: 08/24/2018 CLINICAL DATA:  Shortness of breath EXAM: PORTABLE CHEST 1 VIEW COMPARISON:  08/24/2018 FINDINGS: Moderate left pleural effusion with left lower lobe atelectasis or infiltrate, both slightly worsening since prior study. Small right effusion with right base atelectasis. Cardiomegaly. No acute bony abnormality. IMPRESSION: Moderate left pleural effusion and left lower lobe atelectasis or infiltrate, both worsening since prior study. Small right effusion with  right base atelectasis. Cardiomegaly.  Electronically Signed   By: Rolm Baptise M.D.   On: 08/24/2018 19:29    EKG: Independently reviewed. Sinus tachycardia (raet 103).   Assessment/Plan   1. Recurrent left pleural effusion  - Presents with insidiously worsening SOB and found to have enlarging left-sided pleural effusion  - He is slightly dyspneic at rest, unable to walk more than a few steps, but not hypoxic or in any acute distress  - No fever or leukocytosis to suggest infectious etiology; he had known CHF, and also has known lung cancer  - Consult IR for thoracentesis, send fluid for analysis, continue supportive care    2. Lung cancer  - Patient is following with oncology, undecided on chemotherapy  - Pleural fluid will be sent for analysis including cytology    3. COPD  - No wheezing or cough on admission  - Continue inhalers   4. CAD  - Has chronic left chest wall pain related to metastasis, but no anginal complaints  - Continue ASA 81 and beta-blocker    5. Ischemic cardiomyopathy  - Appears compensated  - Continue beta-blocker    6. 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 Hypothyroidism   - Continue Synthroid      PPE: mask, face shield  DVT prophylaxis: SCD's  Code Status: Full  Family Communication: Discussed with patient  Consults called: None Admission status: Observation     Vianne Bulls, MD [facility] Pager [phone]  If 7PM-7AM, please contact night-coverage [email or web address] Password [facility]  08/24/2018, 9:24 PM

## 2018-08-24 NOTE — ED Notes (Signed)
Portable x-ray at the bedside.  

## 2018-08-24 NOTE — Progress Notes (Signed)
Virtual Visit via Video Note  I connected with Trevor Griffin on 08/24/18 at  1:30 PM EDT by a video enabled telemedicine application and verified that I am speaking with the correct person using two identifiers.   I discussed the limitations of evaluation and management by telemedicine and the availability of in person appointments. The patient expressed understanding and agreed to proceed.  History of Present Illness: He was scheduled today for a virtual visit.  When I got him on the call he told me that he had just been seen at an urgent care center for worsening cough and shortness of breath.  He says the doctor at the urgent care center told him to call me to order a stat CT scan because his chest x-ray showed a worsening pleural effusion on the left side.  He denies fever, chills, or hemoptysis.    Observations/Objective: Ill-appearing male with labored breathing.  Calm, cooperative, and appropriate.  Lab Results  Component Value Date   WBC 6.2 06/03/2018   HGB 13.1 06/03/2018   HCT 40.0 06/03/2018   PLT 157 06/03/2018   GLUCOSE 88 06/03/2018   CHOL 145 06/22/2018   TRIG 66.0 06/22/2018   HDL 72.20 06/22/2018   LDLDIRECT 148.4 10/18/2009   LDLCALC 59 06/22/2018   ALT 14 04/26/2018   AST 17 04/26/2018   NA 132 (L) 06/03/2018   K 4.1 06/03/2018   CL 99 06/03/2018   CREATININE 0.82 06/03/2018   BUN 7 (L) 06/03/2018   CO2 24 06/03/2018   TSH 11.93 (H) 06/22/2018   INR 1.05 01/15/2018   HGBA1C 4.2 (L) 04/16/2017   Dg Chest 2 View  Result Date: 08/24/2018 CLINICAL DATA:  63 year old with current history of metastatic LEFT UPPER LOBE lung cancer, presenting with chronic persistent shortness of breath for at least 2 months, associated with productive cough. EXAM: CHEST - 2 VIEW COMPARISON:  07/21/2018 and earlier, including CT chest 06/03/2018. FINDINGS: Since the examination 1 month ago, interval increase in size of the now moderate to large LEFT pleural effusion and development  of consolidation in the LEFT LOWER LOBE. Cardiac silhouette moderately enlarged, unchanged. Pulmonary vascularity normal without evidence of pulmonary edema. Stable chronic pleuroparenchymal scarring at the RIGHT lung base. RIGHT lung otherwise clear. Metallic fiducial markers in the LEFT UPPER LOBE as previously noted. The previously identified LEFT LATERAL sixth rib fracture is not conspicuous. IMPRESSION: 1. Since the examination 1 month ago, increase in size of the now moderate to large LEFT pleural effusion and associated passive atelectasis and/or pneumonia involving the LEFT LOWER LOBE. 2. Stable cardiomegaly without evidence of pulmonary edema. Electronically Signed   By: Evangeline Dakin M.D.   On: 08/24/2018 12:34    Assessment and Plan: He is symptomatic from a worsening left pleural effusion.  As requested, I have ordered a stat CT thoracentesis for diagnostic and therapeutic purposes.  We will call him to set up the CT scan.   Follow Up Instructions: He was advised to go to the ED if he develops any worsening symptoms.    I discussed the assessment and treatment plan with the patient. The patient was provided an opportunity to ask questions and all were answered. The patient agreed with the plan and demonstrated an understanding of the instructions.   The patient was advised to call back or seek an in-person evaluation if the symptoms worsen or if the condition fails to improve as anticipated.  I provided 15 minutes of non-face-to-face time during this encounter.  Scarlette Calico, MD

## 2018-08-24 NOTE — Telephone Encounter (Signed)
An order was placed for pt to have an IR THORACENTESIS.  Unable to get scheduled until Thursday 4/23 am. Advised pt to go to ED per Dr. Ronnald Ramp

## 2018-08-24 NOTE — ED Triage Notes (Signed)
Pt from home with increasing sob over the last few weeks. Went to UC and dx with L sided pleural effusion. Pt went home and SOB became worse so called EMS. RR 30, HR 110 ST, 126/73, CBG 111, 96% RA, temp 98.1. pt in NAD. 18g LAC.

## 2018-08-25 ENCOUNTER — Observation Stay (HOSPITAL_COMMUNITY): Payer: Medicare Other

## 2018-08-25 ENCOUNTER — Telehealth: Payer: Self-pay | Admitting: *Deleted

## 2018-08-25 ENCOUNTER — Encounter (HOSPITAL_COMMUNITY): Payer: Self-pay | Admitting: Student

## 2018-08-25 DIAGNOSIS — Z7189 Other specified counseling: Secondary | ICD-10-CM | POA: Diagnosis not present

## 2018-08-25 DIAGNOSIS — Z515 Encounter for palliative care: Secondary | ICD-10-CM | POA: Diagnosis not present

## 2018-08-25 DIAGNOSIS — J449 Chronic obstructive pulmonary disease, unspecified: Secondary | ICD-10-CM | POA: Diagnosis present

## 2018-08-25 DIAGNOSIS — J90 Pleural effusion, not elsewhere classified: Secondary | ICD-10-CM | POA: Diagnosis not present

## 2018-08-25 DIAGNOSIS — C023 Malignant neoplasm of anterior two-thirds of tongue, part unspecified: Secondary | ICD-10-CM | POA: Diagnosis not present

## 2018-08-25 DIAGNOSIS — Z7982 Long term (current) use of aspirin: Secondary | ICD-10-CM | POA: Diagnosis not present

## 2018-08-25 DIAGNOSIS — R846 Abnormal cytological findings in specimens from respiratory organs and thorax: Secondary | ICD-10-CM | POA: Diagnosis not present

## 2018-08-25 DIAGNOSIS — C7951 Secondary malignant neoplasm of bone: Secondary | ICD-10-CM | POA: Diagnosis present

## 2018-08-25 DIAGNOSIS — R0602 Shortness of breath: Secondary | ICD-10-CM

## 2018-08-25 DIAGNOSIS — Z85810 Personal history of malignant neoplasm of tongue: Secondary | ICD-10-CM | POA: Diagnosis not present

## 2018-08-25 DIAGNOSIS — Z841 Family history of disorders of kidney and ureter: Secondary | ICD-10-CM | POA: Diagnosis not present

## 2018-08-25 DIAGNOSIS — Z87891 Personal history of nicotine dependence: Secondary | ICD-10-CM | POA: Diagnosis not present

## 2018-08-25 DIAGNOSIS — F419 Anxiety disorder, unspecified: Secondary | ICD-10-CM | POA: Diagnosis present

## 2018-08-25 DIAGNOSIS — E039 Hypothyroidism, unspecified: Secondary | ICD-10-CM | POA: Diagnosis present

## 2018-08-25 DIAGNOSIS — K219 Gastro-esophageal reflux disease without esophagitis: Secondary | ICD-10-CM | POA: Diagnosis present

## 2018-08-25 DIAGNOSIS — I2510 Atherosclerotic heart disease of native coronary artery without angina pectoris: Secondary | ICD-10-CM | POA: Diagnosis not present

## 2018-08-25 DIAGNOSIS — Z9861 Coronary angioplasty status: Secondary | ICD-10-CM | POA: Diagnosis not present

## 2018-08-25 DIAGNOSIS — I252 Old myocardial infarction: Secondary | ICD-10-CM | POA: Diagnosis not present

## 2018-08-25 DIAGNOSIS — Z8249 Family history of ischemic heart disease and other diseases of the circulatory system: Secondary | ICD-10-CM | POA: Diagnosis not present

## 2018-08-25 DIAGNOSIS — Z923 Personal history of irradiation: Secondary | ICD-10-CM | POA: Diagnosis not present

## 2018-08-25 DIAGNOSIS — C3412 Malignant neoplasm of upper lobe, left bronchus or lung: Secondary | ICD-10-CM | POA: Diagnosis not present

## 2018-08-25 DIAGNOSIS — F329 Major depressive disorder, single episode, unspecified: Secondary | ICD-10-CM | POA: Diagnosis present

## 2018-08-25 DIAGNOSIS — E43 Unspecified severe protein-calorie malnutrition: Secondary | ICD-10-CM | POA: Diagnosis present

## 2018-08-25 DIAGNOSIS — I255 Ischemic cardiomyopathy: Secondary | ICD-10-CM | POA: Diagnosis present

## 2018-08-25 DIAGNOSIS — Z66 Do not resuscitate: Secondary | ICD-10-CM | POA: Diagnosis present

## 2018-08-25 DIAGNOSIS — Z885 Allergy status to narcotic agent status: Secondary | ICD-10-CM | POA: Diagnosis not present

## 2018-08-25 DIAGNOSIS — I10 Essential (primary) hypertension: Secondary | ICD-10-CM | POA: Diagnosis not present

## 2018-08-25 DIAGNOSIS — J910 Malignant pleural effusion: Secondary | ICD-10-CM | POA: Diagnosis present

## 2018-08-25 DIAGNOSIS — G893 Neoplasm related pain (acute) (chronic): Secondary | ICD-10-CM | POA: Diagnosis present

## 2018-08-25 DIAGNOSIS — Z888 Allergy status to other drugs, medicaments and biological substances status: Secondary | ICD-10-CM | POA: Diagnosis not present

## 2018-08-25 HISTORY — PX: IR THORACENTESIS ASP PLEURAL SPACE W/IMG GUIDE: IMG5380

## 2018-08-25 LAB — LACTATE DEHYDROGENASE, PLEURAL OR PERITONEAL FLUID: LD, Fluid: 300 U/L — ABNORMAL HIGH (ref 3–23)

## 2018-08-25 LAB — CBC WITH DIFFERENTIAL/PLATELET
Abs Immature Granulocytes: 0.03 10*3/uL (ref 0.00–0.07)
Basophils Absolute: 0 10*3/uL (ref 0.0–0.1)
Basophils Relative: 1 %
Eosinophils Absolute: 0 10*3/uL (ref 0.0–0.5)
Eosinophils Relative: 1 %
HCT: 39.7 % (ref 39.0–52.0)
Hemoglobin: 13 g/dL (ref 13.0–17.0)
Immature Granulocytes: 1 %
Lymphocytes Relative: 8 %
Lymphs Abs: 0.4 10*3/uL — ABNORMAL LOW (ref 0.7–4.0)
MCH: 32.2 pg (ref 26.0–34.0)
MCHC: 32.7 g/dL (ref 30.0–36.0)
MCV: 98.3 fL (ref 80.0–100.0)
Monocytes Absolute: 1.1 10*3/uL — ABNORMAL HIGH (ref 0.1–1.0)
Monocytes Relative: 19 %
Neutro Abs: 4.1 10*3/uL (ref 1.7–7.7)
Neutrophils Relative %: 70 %
Platelets: 263 10*3/uL (ref 150–400)
RBC: 4.04 MIL/uL — ABNORMAL LOW (ref 4.22–5.81)
RDW: 14.8 % (ref 11.5–15.5)
WBC: 5.7 10*3/uL (ref 4.0–10.5)
nRBC: 0 % (ref 0.0–0.2)

## 2018-08-25 LAB — BODY FLUID CELL COUNT WITH DIFFERENTIAL
Eos, Fluid: 4 %
Lymphs, Fluid: 66 %
Monocyte-Macrophage-Serous Fluid: 17 % — ABNORMAL LOW (ref 50–90)
Neutrophil Count, Fluid: 13 % (ref 0–25)
Total Nucleated Cell Count, Fluid: 650 cu mm (ref 0–1000)

## 2018-08-25 LAB — GRAM STAIN: Gram Stain: NONE SEEN

## 2018-08-25 LAB — COMPREHENSIVE METABOLIC PANEL
ALT: 10 U/L (ref 0–44)
AST: 15 U/L (ref 15–41)
Albumin: 2.7 g/dL — ABNORMAL LOW (ref 3.5–5.0)
Alkaline Phosphatase: 88 U/L (ref 38–126)
Anion gap: 13 (ref 5–15)
BUN: <5 mg/dL — ABNORMAL LOW (ref 8–23)
CO2: 23 mmol/L (ref 22–32)
Calcium: 9.3 mg/dL (ref 8.9–10.3)
Chloride: 96 mmol/L — ABNORMAL LOW (ref 98–111)
Creatinine, Ser: 0.73 mg/dL (ref 0.61–1.24)
GFR calc Af Amer: >60 mL/min (ref >60)
GFR calc non Af Amer: >60 mL/min (ref >60)
Glucose, Bld: 87 mg/dL (ref 70–99)
Potassium: 4.6 mmol/L (ref 3.5–5.1)
Sodium: 132 mmol/L — ABNORMAL LOW (ref 135–145)
Total Bilirubin: 1.1 mg/dL (ref 0.3–1.2)
Total Protein: 6.9 g/dL (ref 6.5–8.1)

## 2018-08-25 LAB — PROTEIN, PLEURAL OR PERITONEAL FLUID: Total protein, fluid: 4.7 g/dL

## 2018-08-25 LAB — HIV ANTIBODY (ROUTINE TESTING W REFLEX): HIV Screen 4th Generation wRfx: NONREACTIVE

## 2018-08-25 MED ORDER — LIDOCAINE HCL 1 % IJ SOLN
INTRAMUSCULAR | Status: AC
  Filled 2018-08-25: qty 20

## 2018-08-25 MED ORDER — LIDOCAINE HCL 1 % IJ SOLN
INTRAMUSCULAR | Status: DC | PRN
  Administered 2018-08-25: 10 mL

## 2018-08-25 NOTE — ED Provider Notes (Signed)
[place]   [id number] 08/24/18 Arrival Time: 8416  ASSESSMENT & PLAN:  1. Shortness of breath   2. Pleural effusion, left    I have personally viewed the imaging studies ordered this visit. Large L lower pleural effusion.  Vitals currently stable. Will need chest CT and thoracentesis. ED is the best option for evaluation. He discusses calling his PCP to see if they can arrange this but I feel he likely will require hospital admission if unable to have this done as an outpatient expeditiously. Discussed.  Reviewed expectations re: course of current medical issues. Questions answered. Outlined signs and symptoms indicating need for more acute intervention. Patient verbalized understanding. After Visit Summary given.   SUBJECTIVE: CC: Shortness of Breath  History from: patient. Trevor Griffin is a 63 y.o. male who presents with complaint of fairly persistent SOB over the past couple of weeks, esp over the past few days. Reports being diagnosed with PNA recently. Treated with antibiotic; finished. "Maybe a little help." Initially SOB with exertion but now SOB at rest that is limiting daily activities. Mild L-sided chest soreness. Afebrile. Overall normal PO intake without n/v. No abdominal pain or lower back pain. Reports normal bowel/bladder habits. No specific aggravating or alleviating factors reported other than SOB worsening with exertion. No OTC tx reported.  Social History   Tobacco Use  Smoking Status Former Smoker   Years: 46.00   Types: Cigars  Smokeless Tobacco Never Used  Tobacco Comment   He has quit smoking recently, he is using a patch   ROS: As per HPI. All other systems negative.   OBJECTIVE:  Vitals:   08/24/18 1211  BP: 108/77  Pulse: 88  Resp: 18  Temp: (!) 97.2 F (36.2 C)  TempSrc: Axillary  SpO2: 98%    General appearance: alert, oriented, no acute distress Eyes: PERRLA; EOMI; conjunctivae normal HENT: normocephalic;  atraumatic Neck: supple with FROM Lungs: without labored respirations but does get winded with talking; decreased breath sounds over L lower lung field Heart: regular rate and rhythm Chest Wall: without tenderness to palpation Abdomen: soft, non-tender; bowel sounds normal; no masses or organomegaly; no guarding or rebound tenderness Extremities: without edema; without calf swelling or tenderness; symmetrical without gross deformities Skin: warm and dry; without rash or lesions Psychological: alert and cooperative; normal mood and affect  Imaging: Dg Chest 2 View  Result Date: 08/24/2018 CLINICAL DATA:  63 year old with current history of metastatic LEFT UPPER LOBE lung cancer, presenting with chronic persistent shortness of breath for at least 2 months, associated with productive cough. EXAM: CHEST - 2 VIEW COMPARISON:  07/21/2018 and earlier, including CT chest 06/03/2018. FINDINGS: Since the examination 1 month ago, interval increase in size of the now moderate to large LEFT pleural effusion and development of consolidation in the LEFT LOWER LOBE. Cardiac silhouette moderately enlarged, unchanged. Pulmonary vascularity normal without evidence of pulmonary edema. Stable chronic pleuroparenchymal scarring at the RIGHT lung base. RIGHT lung otherwise clear. Metallic fiducial markers in the LEFT UPPER LOBE as previously noted. The previously identified LEFT LATERAL sixth rib fracture is not conspicuous. IMPRESSION: 1. Since the examination 1 month ago, increase in size of the now moderate to large LEFT pleural effusion and associated passive atelectasis and/or pneumonia involving the LEFT LOWER LOBE. 2. Stable cardiomegaly without evidence of pulmonary edema. Electronically Signed   By: Evangeline Dakin M.D.   On: 08/24/2018 12:34     Allergies  Allergen Reactions   Hydromorphone Nausea Only and  Other (See Comments)    Headaches also   Nubain [Nalbuphine Hcl] Nausea Only and Other (See Comments)     Headaches also    Past Medical History:  Diagnosis Date   Acute ST elevation myocardial infarction (STEMI) involving left anterior descending (LAD) coronary artery ([place]) 2008 and 2009   5/'08 - DES x 4 to p-m LAD; 2/'09 - PTCA of  in-stent thrombosis   Alcohol abuse    Anxiety    CAD S/P percutaneous coronary angioplasty 2008   a. Ant STEMI 5/08 w/ 4 Taxus DES p-m LAD (3.0 x 16,16,24,28 mm) ;  b. 2/'09:  Stopped Plavix-> Ant STEMI/stent thrombosis in LAD (PTCA - 3.8 mm), LCX 53m/d;  c. 10/12: Cath/PCI: p-m LAD stents 10-20% ISR, mLAD 40%, CFX 30-40%, mCFX 90% (Promus Premier DES 3.0 mm x 20 mm - 3.66mm ), EF 35%; d. cath 2/17/'16 no Sig CAD, patent LAD & Cx stents, EF 40-45%   Chronic pain    COPD (chronic obstructive pulmonary disease) (HCC)    Depression    Fatigue    Sleep study 6/08 was negative. Sleep study 5/13 showed no OSA.   GERD (gastroesophageal reflux disease)    Headache(784.0)    migraines   History of radiation therapy 05/27/2018- 06/02/2018   Left 6th rib tumor, 5 Gy X 5 fractions for a total dose of 25 Gy.    Hx of radiation therapy 10/25/12-12/10/12    Right Tongue/ Bilateral Neck //Total Dose, 60 Gy in 30 Fractions   Hyperlipidemia    Hypertension    Ischemic cardiomyopathy    a. Echo 08/20/11 with EF 50-55% (previously 35%);  b. 02/2014 Echo: EF 45-50-%, mid-apicalanteroseptal AK, Gr 1 dd, mild AI, midlly dil LA.   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   s/p resection - [facility]   Umbilical hernia    Social History   Socioeconomic History   Marital status: Married    Spouse name: Not on file   Number of children: Not on file   Years of education: Not on file   Highest education level: Not on file  Occupational History   Occupation: Disabled  Scientist, product/process development strain: Not on file   Food insecurity:    Worry: Not on file    Inability: Not on file    Transportation needs:    Medical: No    Non-medical: No  Tobacco Use   Smoking status: Former Smoker    Years: 46.00    Types: Cigars   Smokeless tobacco: Never Used   Tobacco comment: He has quit smoking recently, he is using a patch  Substance and Sexual Activity   Alcohol use: Yes    Alcohol/week: 3.0 standard drinks    Types: 3 Cans of beer per week    Comment: 3 beers 4 days a week   Drug use: No   Sexual activity: Yes  Lifestyle   Physical activity:    Days per week: 7 days    Minutes per session: 20 min   Stress: Not on file  Relationships   Social connections:    Talks on phone: Not on file    Gets together: Not on file    Attends religious service: Not on file    Active member of club or organization: Not on file    Attends meetings of clubs or organizations: Not on file  Relationship status: Not on file   Intimate partner violence:    Fear of current or ex partner: No    Emotionally abused: No    Physically abused: No    Forced sexual activity: No  Other Topics Concern   Not on file  Social History Narrative   Alcoholic beverage:  Yes   Drug use:  No   Seatbead Use: Yes   Firearms in home:  Yes   Exercise:  No   Smoke Alarm in your home: Yes   Family History  Problem Relation Age of Onset   Kidney disease Mother        CHRONIC   Cancer Sister    Liver disease Sister        LIVER FAILURE   Heart attack Brother    Past Surgical History:  Procedure Laterality Date   COLONOSCOPY N/A 11/25/2016   Procedure: COLONOSCOPY;  Surgeon: Clarene Essex, MD;  Location: [facility];  Service: Endoscopy;  Laterality: N/A;   CORONARY ANGIOPLASTY WITH STENT PLACEMENT  2008-2012   a. 5/'08: Anterior STEMI: Proximal-mid LAD PCI: 4 total Taxus DES stents 3.0 mm x  16 mm, 16 mm, 24 mm and 28 mm.( post-dil 3.5 mm);; b. 2/'09: PTCA LAD in-stent thrombosis - 3.8 mm;; c. 10/'12: mCx Promus Element DES 3.0 x 20 mm- post-dil - 3.25 mm. -> patent in 2016    ESOPHAGOGASTRODUODENOSCOPY (EGD) WITH PROPOFOL N/A 11/25/2016   Procedure: ESOPHAGOGASTRODUODENOSCOPY (EGD) WITH PROPOFOL;  Surgeon: Clarene Essex, MD;  Location: [facility];  Service: Endoscopy;  Laterality: N/A;  neonatal scope   FRACTURE SURGERY Right    as a child,leg   FUDUCIAL PLACEMENT N/A 01/15/2018   Procedure: PLACEMENT OF FUDUCIAL;  Surgeon: Melrose Nakayama, MD;  Location: [place];  Service: Thoracic;  Laterality: N/A;   LEFT HEART CATH AND CORONARY ANGIOGRAPHY  2006-2012   a) 2006: minimal CAD, normal EF;; b) 5/'08 - Ant STEMI - 100% p-mLAD (DES x 4 in LAD);; c)2/'09: Ant STEMI for IST/thrombosis of LAD ->PTCA, 80% mCx;; 10/'12: mCx 80-90% - DES PCI, patent LAD stents, EF ~35% - anterolateral AK, apical severe HK.   LEFT HEART CATHETERIZATION WITH CORONARY ANGIOGRAM N/A 06/21/2014   Procedure: LEFT HEART CATHETERIZATION WITH CORONARY ANGIOGRAM;  Surgeon: Burnell Blanks, MD;  Location: [facility] CATH LAB: No evidence of obstructive disease.  Patent LAD and circumflex stents.  EF 40-45%.   LOBECTOMY Right 06/06/2013   Procedure: LOBECTOMY;  Surgeon: Melrose Nakayama, MD;  Location: [place];  Service: Thoracic;  Laterality: Right;   TRANSTHORACIC ECHOCARDIOGRAM  02/2014   EF 45-50%.  Akinesis of the mid apical anteroseptal wall.  GR 1 DD.  Mild MR.   VIDEO ASSISTED THORACOSCOPY (VATS)/WEDGE RESECTION Right 06/06/2013   Procedure: VIDEO ASSISTED THORACOSCOPY (VATS)/WEDGE RESECTION;  Surgeon: Melrose Nakayama, MD;  Location: [place];  Service: Thoracic;  Laterality: Right;   VIDEO BRONCHOSCOPY WITH ENDOBRONCHIAL NAVIGATION N/A 01/15/2018   Procedure: VIDEO BRONCHOSCOPY WITH ENDOBRONCHIAL NAVIGATION;  Surgeon: Melrose Nakayama, MD;  Location: [facility] OR;  Service: Thoracic;  Laterality: Ginette Pitman, MD 08/25/18 [phone]

## 2018-08-25 NOTE — Progress Notes (Signed)
Patient coughed with each sip of water he had this morning,he stated that he has some difficulty swallowing due to his esophageal stricture and tongue cancer, speech eval ordered to determine appropriate diet for patient.

## 2018-08-25 NOTE — Progress Notes (Signed)
SATURATION QUALIFICATIONS: (This note is used to comply with regulatory documentation for home oxygen)  Patient Saturations on Room Air at Rest = 92%  Patient Saturations on Room Air while Ambulating = 84%  Patient Saturations on  4 Liters of oxygen while Ambulating = 93%  Please briefly explain why patient needs home oxygen: patient needs home oxygen to maintain adequate tissue oxygenation during ambulation.

## 2018-08-25 NOTE — Progress Notes (Signed)
Progress Note    Trevor Griffin  HAL:[id number] DOB: 1956-03-02  DOA: 08/24/2018 PCP: Janith Lima, MD    Brief Narrative:     Medical records reviewed and are as summarized below:  Trevor Griffin is an 63 y.o. male with medical history significant for tongue cancer, metastatic lung cancer, COPD, coronary artery disease, ischemic cardiomyopathy, chronic cancer related pain, and hypothyroidism, now presenting to emergency department for evaluation of shortness of breath and increased left pleural effusion noted on outpatient imaging.  Patient reports insidiously worsening dyspnea over the past 6 weeks or so, now only able to walk several steps.  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 Recurrent left pleural effusion  - Presents with insidiously worsening SOB and found to have enlarging left-sided pleural effusion  - He is slightly dyspneic at rest, unable to walk more than a few steps - No fever or leukocytosis to suggest infectious etiology; he had known CHF, and also has known lung cancer  - Consult IR for thoracentesis, send fluid for analysis, continue supportive care  -discussed palliative care with wife   2. Lung cancer  - Patient is following with oncology, undecided on chemotherapy  - Pleural fluid will be sent for analysis including cytology    3. COPD  - No wheezing or cough on admission  - Continue inhalers   4. CAD  - Has chronic left chest wall pain related to metastasis, but no anginal complaints  - Continue ASA 81 and beta-blocker    5. Ischemic cardiomyopathy  - Appears compensated  - Continue  beta-blocker    6. 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 Hypothyroidism   - Continue Synthroid     Family Communication/Anticipated D/C date and plan/Code Status   DVT prophylaxis:scd Code Status: Full Code.  Family Communication: called wife Disposition Plan: home after thoracentesis- needs home O2 study as well   Medical Consultants:    IR  Subjective:   Unable to get comfortable-- feeling short of breath  Objective:    Vitals:   08/25/18 0749 08/25/18 0828 08/25/18 0930 08/25/18 0942  BP: 129/90  123/69 123/68  Pulse: (!) 117     Resp: (!) 22     Temp: 97.7 F (36.5 C)     TempSrc: Oral     SpO2: 97% 96%    Weight:      Height:        Intake/Output Summary (Last 24 hours) at 08/25/2018 1003 Last data filed at 08/25/2018 0947 Gross per 24 hour  Intake 0 ml  Output 1900 ml  Net -1900 ml   Filed Weights   08/24/18 1747 08/24/18 2226  Weight: 70.3 kg 72.2 kg    Exam: Uncomfortable appearing (seen prior to thoracentesis) Tachy Coarse breath sounds left lung > right lung  Data Reviewed:   I have personally reviewed following labs and imaging studies:  Labs: Labs show the following:   Basic Metabolic Panel: Recent Labs  Lab 08/24/18 1911 08/25/18 0320  NA 133* 132*  K 4.1 4.6  CL 95* 96*  CO2 25 23  GLUCOSE 80 87  BUN <5* <5*  CREATININE 0.61 0.73  CALCIUM 9.5 9.3   GFR Estimated Creatinine Clearance: 92.6 mL/min (by C-G formula based on SCr of 0.73 mg/dL). Liver Function Tests: Recent Labs  Lab 08/24/18 1911 08/25/18 0320  AST 16 15  ALT 9 10  ALKPHOS 84 88  BILITOT 0.7 1.1  PROT 7.2 6.9  ALBUMIN 2.7* 2.7*   No results for input(s): LIPASE, AMYLASE in the last 168 hours. No results for input(s): AMMONIA in the last 168 hours. Coagulation profile No results for input(s): INR, PROTIME in the last 168 hours.  CBC: Recent Labs   Lab 08/24/18 1911 08/25/18 0320  WBC 5.6 5.7  NEUTROABS  --  4.1  HGB 12.8* 13.0  HCT 39.2 39.7  MCV 100.0 98.3  PLT 225 263   Cardiac Enzymes: Recent Labs  Lab 08/24/18 1911  TROPONINI <0.03   BNP (last 3 results) No results for input(s): PROBNP in the last 8760 hours. CBG: No results for input(s): GLUCAP in the last 168 hours. D-Dimer: No results for input(s): DDIMER in the last 72 hours. Hgb A1c: No results for input(s): HGBA1C in the last 72 hours. Lipid Profile: No results for input(s): CHOL, HDL, LDLCALC, TRIG, CHOLHDL, LDLDIRECT in the last 72 hours. Thyroid function studies: No results for input(s): TSH, T4TOTAL, T3FREE, THYROIDAB in the last 72 hours.  Invalid input(s): FREET3 Anemia work up: No results for input(s): VITAMINB12, FOLATE, FERRITIN, TIBC, IRON, RETICCTPCT in the last 72 hours. Sepsis Labs: Recent Labs  Lab 08/24/18 1911 08/25/18 0320  WBC 5.6 5.7    Microbiology No results found for this or any previous visit (from the past 240 hour(s)).  Procedures and diagnostic studies:  Dg Chest 2 View  Result Date: 08/24/2018 CLINICAL DATA:  63 year old with current history of metastatic LEFT UPPER LOBE lung cancer, presenting with chronic persistent shortness of breath for at least 2 months, associated with productive cough. EXAM: CHEST - 2 VIEW COMPARISON:  07/21/2018 and earlier, including CT chest 06/03/2018. FINDINGS: Since the examination 1 month ago, interval increase in size of the now moderate to large LEFT pleural effusion and development of consolidation in the LEFT LOWER LOBE. Cardiac silhouette moderately enlarged, unchanged. Pulmonary vascularity normal without evidence of pulmonary edema. Stable chronic pleuroparenchymal scarring at the RIGHT lung base. RIGHT lung otherwise clear. Metallic fiducial markers in the LEFT UPPER LOBE as previously noted. The previously identified LEFT LATERAL sixth rib fracture is not conspicuous. IMPRESSION: 1.  Since the examination 1 month ago, increase in size of the now moderate to large LEFT pleural effusion and associated passive atelectasis and/or pneumonia involving the LEFT LOWER LOBE. 2. Stable cardiomegaly without evidence of pulmonary edema. Electronically Signed   By: Evangeline Dakin M.D.   On: 08/24/2018 12:34   Dg Chest Port 1 View  Result Date: 08/24/2018 CLINICAL DATA:  Shortness of breath EXAM: PORTABLE CHEST 1 VIEW COMPARISON:  08/24/2018 FINDINGS: Moderate left pleural effusion with left lower lobe atelectasis or infiltrate, both slightly worsening since prior study. Small right effusion with right base atelectasis. Cardiomegaly. No acute bony abnormality. IMPRESSION: Moderate left pleural effusion and left lower lobe atelectasis or infiltrate, both worsening since prior study. Small right effusion with right base atelectasis. Cardiomegaly. Electronically Signed   By: Rolm Baptise M.D.   On: 08/24/2018 19:29    Medications:   . aspirin EC  81  mg Oral Daily  . feeding supplement (ENSURE ENLIVE)  1 Bottle Oral QID  . levothyroxine  175 mcg Oral QAC breakfast  . lidocaine      . lidocaine      . metoprolol tartrate  12.5 mg Oral BID  . pantoprazole  40 mg Oral Daily  . sodium chloride flush  3 mL Intravenous Q12H  . umeclidinium bromide  1 puff Inhalation Daily   Continuous Infusions: . sodium chloride       LOS: 0 days   [facility]   How to contact the [facility] Attending or Consulting provider [place] or covering provider during after hours [place], for this patient?  1. Check the care team in [facility] and look for a) attending/consulting [facility] provider listed and b) the [facility] team listed 2. Log into [email or web address] and use [place]'s universal password to access. If you do not have the password, please contact the hospital operator. 3. Locate the [facility] provider you are looking for under [facility] and page to a number that you can be directly reached. 4.  If you still have difficulty reaching the provider, please page the [facility] (Director on Call) for the Hospitalists listed on amion for assistance.  08/25/2018, 10:03 AM

## 2018-08-25 NOTE — Procedures (Addendum)
PROCEDURE SUMMARY:  Successful image-guided left thoracentesis. Yielded 1.7 liters of dark red/blood-tinged fluid. Procedure was stopped at 1.7 L due to patient discomfort. Patient tolerated procedure well. No immediate complications. EBL = 5 mL.  Specimen was sent for labs. CXR ordered.  Claris Pong Holmes [place] PA-C 08/25/2018 9:52 AM

## 2018-08-25 NOTE — Evaluation (Signed)
Clinical/Bedside Swallow Evaluation Patient Details  Name: Trevor Griffin MRN: [id number] Date of Birth: Aug 22, 1955  Today's Date: 08/25/2018 Time: SLP Start Time (ACUTE ONLY): 5956 SLP Stop Time (ACUTE ONLY): 1505 SLP Time Calculation (min) (ACUTE ONLY): 20 min  Past Medical History:  Past Medical History:  Diagnosis Date  . Acute ST elevation myocardial infarction (STEMI) involving left anterior descending (LAD) coronary artery ([place]) 2008 and 2009   5/'08 - DES x 4 to p-m LAD; 2/'09 - PTCA of  in-stent thrombosis  . Alcohol abuse   . Anxiety   . CAD S/P percutaneous coronary angioplasty 2008   a. Ant STEMI 5/08 w/ 4 Taxus DES p-m LAD (3.0 x 16,16,24,28 mm) ;  b. 2/'09:  Stopped Plavix-> Ant STEMI/stent thrombosis in LAD (PTCA - 3.8 mm), LCX 82m/d;  c. 10/12: Cath/PCI: p-m LAD stents 10-20% ISR, mLAD 40%, CFX 30-40%, mCFX 90% (Promus Premier DES 3.0 mm x 20 mm - 3.40mm ), EF 35%; d. cath 2/17/'16 no Sig CAD, patent LAD & Cx stents, EF 40-45%  . Chronic pain   . COPD (chronic obstructive pulmonary disease) ([place])   . Depression   . Fatigue    Sleep study 6/08 was negative. Sleep study 5/13 showed no OSA.  Marland Kitchen GERD (gastroesophageal reflux disease)   . Headache(784.0)    migraines  . History of radiation therapy 05/27/2018- 06/02/2018   Left 6th rib tumor, 5 Gy X 5 fractions for a total dose of 25 Gy.   Marland Kitchen Hx of radiation therapy 10/25/12-12/10/12    Right Tongue/ Bilateral Neck //Total Dose, 60 Gy in 30 Fractions  . Hyperlipidemia   . Hypertension   . Ischemic cardiomyopathy    a. Echo 08/20/11 with EF 50-55% (previously 35%);  b. 02/2014 Echo: EF 45-50-%, mid-apicalanteroseptal AK, Gr 1 dd, mild AI, midlly dil LA.  . Lung cancer ([place]) dx'd 2015  . Obesity   . Pleural effusion 08/2018   recurrent   . Tobacco abuse   . 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 Umbilical hernia    Past Surgical History:  Past Surgical History:  Procedure Laterality Date  .  COLONOSCOPY N/A 11/25/2016   Procedure: COLONOSCOPY;  Surgeon: Clarene Essex, MD;  Location: [facility];  Service: Endoscopy;  Laterality: N/A;  . CORONARY ANGIOPLASTY WITH STENT PLACEMENT  2008-2012   a. 5/'08: Anterior STEMI: Proximal-mid LAD PCI: 4 total Taxus DES stents 3.0 mm x  16 mm, 16 mm, 24 mm and 28 mm.( post-dil 3.5 mm);; b. 2/'09: PTCA LAD in-stent thrombosis - 3.8 mm;; c. 10/'12: mCx Promus Element DES 3.0 x 20 mm- post-dil - 3.25 mm. -> patent in 2016  . ESOPHAGOGASTRODUODENOSCOPY (EGD) WITH PROPOFOL N/A 11/25/2016   Procedure: ESOPHAGOGASTRODUODENOSCOPY (EGD) WITH PROPOFOL;  Surgeon: Clarene Essex, MD;  Location: [facility];  Service: Endoscopy;  Laterality: N/A;  neonatal scope  . FRACTURE SURGERY Right    as a child,leg  . FUDUCIAL PLACEMENT N/A 01/15/2018   Procedure: PLACEMENT OF FUDUCIAL;  Surgeon: Melrose Nakayama, MD;  Location: [place];  Service: Thoracic;  Laterality: N/A;  . IR THORACENTESIS ASP PLEURAL SPACE W/IMG GUIDE  08/25/2018  . LEFT HEART CATH AND CORONARY ANGIOGRAPHY  2006-2012   a) 2006: minimal CAD, normal EF;; b) 5/'08 - Ant STEMI - 100% p-mLAD (DES x 4 in LAD);; c)2/'09: Ant STEMI for IST/thrombosis of LAD ->PTCA, 80% mCx;; 10/'12: mCx 80-90% - DES PCI, patent LAD stents, EF ~35% - anterolateral  AK, apical severe HK.  Marland Kitchen LEFT HEART CATHETERIZATION WITH CORONARY ANGIOGRAM N/A 06/21/2014   Procedure: LEFT HEART CATHETERIZATION WITH CORONARY ANGIOGRAM;  Surgeon: Burnell Blanks, MD;  Location: [facility] CATH LAB: No evidence of obstructive disease.  Patent LAD and circumflex stents.  EF 40-45%.  . LOBECTOMY Right 06/06/2013   Procedure: LOBECTOMY;  Surgeon: Melrose Nakayama, MD;  Location: [place];  Service: Thoracic;  Laterality: Right;  . TRANSTHORACIC ECHOCARDIOGRAM  02/2014   EF 45-50%.  Akinesis of the mid apical anteroseptal wall.  GR 1 DD.  Mild MR.  . VIDEO ASSISTED THORACOSCOPY (VATS)/WEDGE RESECTION Right 06/06/2013   Procedure: VIDEO ASSISTED THORACOSCOPY  (VATS)/WEDGE RESECTION;  Surgeon: Melrose Nakayama, MD;  Location: [place];  Service: Thoracic;  Laterality: Right;  Marland Kitchen VIDEO BRONCHOSCOPY WITH ENDOBRONCHIAL NAVIGATION N/A 01/15/2018   Procedure: VIDEO BRONCHOSCOPY WITH ENDOBRONCHIAL NAVIGATION;  Surgeon: Melrose Nakayama, MD;  Location: [place];  Service: Thoracic;  Laterality: N/A;   HPI:  Patient is a 63 y.o. male with PMH: tongue cancer s/p partial glossectomy with left radial forearm fasicocutaneous free flap reconstructiion, and bilateral selective neck dissection 09/16/12), and h/o radiation therapy, metastatic lung cancer, COPD, CAD, ischemic cardiomyopathy, chronic cancer related pain, hypothyroidism, who presented to the hospital for evaluation of SOB and increased left pleural effusion noted in outpatient setting. Patient reporded worsening dyspnea over past 6 weeks prior and only able to walk a few steps. He had been treated for possible PNA with Augmentin last month with no improvement. Upon ED admission, patient was afebrile, SpO2 saturation 90% on room air, slightly tachypneic with stable blood pressure. CXR revealed moderate left sided pleural effusion and left lower lobe atelectasis or infiltrate. Patient underwent IR Thoracentesis which removed 1.7 liters of dark red/blood tinged fluid before procedure was stopped secondary to patient c/o pain.    Assessment / Plan / Recommendation Clinical Impression  Patient presents with a mild oropharyngeal dysphagia with minimal s/s of aspiration/penetration with thin liquids vs cough related to pleural fluid/PNA. Patient has h/o dysphagia related to tongue cancer, s/p resection, leading to decreased function of tongue, as well as neck dissection and radition treatments. He had an MBS on 04/01/2017 with noted diffculty in pharyngeal transit of solid boluses. Plan to perform MBS for comparison to previous study and to r/o aspiration.  SLP Visit Diagnosis: Dysphagia, oropharyngeal phase (R13.12)     Aspiration Risk  Mild aspiration risk    Diet Recommendation Thin liquid;Regular   Liquid Administration via: Cup;Straw Medication Administration: Whole meds with liquid Supervision: Patient able to self feed Compensations: Minimize environmental distractions;Slow rate;Small sips/bites Postural Changes: Seated upright at 90 degrees    Other  Recommendations Oral Care Recommendations: Oral care BID   Follow up Recommendations Other (comment)(TBD )      Frequency and Duration min 1 x/week  1 week       Prognosis Prognosis for Safe Diet Advancement: Good      Swallow Study   General Date of Onset: 08/24/18 HPI: Patient is a 63 y.o. male with PMH: tongue cancer s/p partial glossectomy with left radial forearm fasicocutaneous free flap reconstructiion, and bilateral selective neck dissection 09/16/12), and h/o radiation therapy, metastatic lung cancer, COPD, CAD, ischemic cardiomyopathy, chronic cancer related pain, hypothyroidism, who presented to the hospital for evaluation of SOB and increased left pleural effusion noted in outpatient setting. Patient reporded worsening dyspnea over past 6 weeks prior and only able to walk a few steps. He had been treated for  possible PNA with Augmentin last month with no improvement. Upon ED admission, patient was afebrile, SpO2 saturation 90% on room air, slightly tachypneic with stable blood pressure. CXR revealed moderate left sided pleural effusion and left lower lobe atelectasis or infiltrate. Patient underwent IR Thoracentesis which removed 1.7 liters of dark red/blood tinged fluid before procedure was stopped secondary to patient c/o pain.  Type of Study: Bedside Swallow Evaluation Previous Swallow Assessment: MBS 04/01/2017 Diet Prior to this Study: Regular;Thin liquids Temperature Spikes Noted: No Respiratory Status: Room air History of Recent Intubation: No Behavior/Cognition: Alert;Cooperative;Pleasant mood Oral Cavity Assessment: Within  Functional Limits Oral Care Completed by SLP: No Oral Cavity - Dentition: Dentures, bottom;Dentures, top Vision: Functional for self-feeding Self-Feeding Abilities: Able to feed self Patient Positioning: Upright in bed Baseline Vocal Quality: Normal Volitional Cough: Strong Volitional Swallow: Able to elicit    Oral/Motor/Sensory Function Overall Oral Motor/Sensory Function: Moderate impairment Lingual ROM: Reduced right;Reduced left Lingual Strength: Reduced Lingual Sensation: Reduced   Ice Chips     Thin Liquid Thin Liquid: Impaired Presentation: Self Fed;Straw Pharyngeal  Phase Impairments: Throat Clearing - Delayed;Cough - Delayed Other Comments: difficult to determine delayed throat clearing and cough from thin liquids or expectoration of secretions as patient with congested cough    Nectar Thick   Not tested  Honey Thick   Not tested  Puree Puree: Not tested   Solid     Solid: Not tested      Dannial Monarch 08/25/2018,4:58 PM   Sonia Baller, MA, CCC-SLP Speech Therapy [facility] Acute Re[facility] Pager: [phone]

## 2018-08-25 NOTE — Plan of Care (Signed)
  Problem: Education: Goal: Knowledge of General Education information will improve Description Including pain rating scale, medication(s)/side effects and non-pharmacologic comfort measures Outcome: Progressing   

## 2018-08-25 NOTE — Telephone Encounter (Signed)
Called patient to inform of appt. with Dr. Maryjean Ka on 09-29-18- arrival time - 10:15 am - address- [address], ph. No. [phone], spoke with Elise Benne and she is aware of this appt.

## 2018-08-26 ENCOUNTER — Ambulatory Visit (HOSPITAL_COMMUNITY): Payer: Medicare Other

## 2018-08-26 ENCOUNTER — Inpatient Hospital Stay (HOSPITAL_COMMUNITY): Payer: Medicare Other

## 2018-08-26 DIAGNOSIS — Z515 Encounter for palliative care: Secondary | ICD-10-CM

## 2018-08-26 DIAGNOSIS — Z7189 Other specified counseling: Secondary | ICD-10-CM

## 2018-08-26 DIAGNOSIS — C023 Malignant neoplasm of anterior two-thirds of tongue, part unspecified: Secondary | ICD-10-CM

## 2018-08-26 MED ORDER — ADULT MULTIVITAMIN W/MINERALS CH
1.0000 | ORAL_TABLET | Freq: Every day | ORAL | Status: DC
  Administered 2018-08-26 – 2018-08-27 (×2): 1 via ORAL
  Filled 2018-08-26 (×2): qty 1

## 2018-08-26 MED ORDER — METOPROLOL TARTRATE 25 MG PO TABS
25.0000 mg | ORAL_TABLET | Freq: Two times a day (BID) | ORAL | Status: DC
  Administered 2018-08-26 – 2018-08-27 (×3): 25 mg via ORAL
  Filled 2018-08-26 (×3): qty 1

## 2018-08-26 NOTE — Progress Notes (Signed)
Patient sleeping and had 8 bts. SVT. R.N. aware.

## 2018-08-26 NOTE — Progress Notes (Signed)
Modified Barium Swallow Progress Note  Patient Details  Name: Trevor Griffin MRN: [id number] Date of Birth: February 17, 1956  Today's Date: 08/26/2018  Modified Barium Swallow completed.  Full report located under Chart Review in the Imaging Section.  Brief recommendations include the following:  Clinical Impression  Patient presents with a mod-severe oral and a moderate pharyngeal dysphagia but as per comparison with MBS on 04/01/2017, no significant changes in swallow function were observed. Patient's oral dysphagia is secondary to h/o partial glossectomy in 2014, resulting in difficulty with moving bolus. After first bite of puree solids, patient had mod-maximal amount of residuals in vallecular sinus that would transit pharyngeally in small amounts with successive dry swallows. Multiple thin liquid sips and repeated dry swallows did help to clear some residuals from vallecular sinus but full clearance did not occur. Patient exhibited delays of swallow initiation to pyriform sinus with thin liquids and penetration of thin liquids which fully cleared laryngeal vestibule with patient cough/throat clear and re-swallow. Patient did sense penetration when it was deep/to the vocal cords, but did not sense when it was flash penetration or penetration above cords. Patient did not exhibit any instances of aspiration, despite several instances of deep penetration. Although vallecular sinus remained quite full, patient was still able to swallow subsequent PO's of puree solids and soft solids (masticated to approximately puree) and did not exhibit any difficulty with transit through UES, and with esophagus transit appearing normal as per esophageal sweep. Barium tablet did enter laryngeal vestibule then transited out and through UES with brief delay in UES opening. Chin tuck posture did not help with clearance of pharyngeal residuals or prevent penetration or liquids.    Patient educated on swallow precautions  (majority of which he has been doing since [facility] in 2018) and SLP and patient both agree that patient can self-manage solid texture consistencies and safely consume thin liquids.   Swallow Evaluation Recommendations       SLP Diet Recommendations: Thin liquid;Dysphagia 3 (Mech soft) solids;Regular solids   Liquid Administration via: Cup;No straw   Medication Administration: Whole meds with liquid   Supervision: Patient able to self feed   Compensations: Small sips/bites;Slow rate;Minimize environmental distractions;Follow solids with liquid;Other (Comment)(swallow multiple times after bite of solids)   Postural Changes: Remain semi-upright after after feeds/meals (Comment);Seated upright at 90 degrees   Oral Care Recommendations: Oral care BID;Patient independent with oral care        Dannial Monarch 08/26/2018,12:36 PM   Sonia Baller, MA, [place] [facility] Pager: [phone]

## 2018-08-26 NOTE — Progress Notes (Signed)
Progress Note    YVETTE ROARK  NWG:[id number] DOB: [birth year]  DOA: 08/24/2018 PCP: Janith Lima, MD    Brief Narrative:     Medical records reviewed and are as summarized below:  [name] is an 63 y.o. male with medical history significant for tongue cancer, metastatic lung cancer, COPD, coronary artery disease, ischemic cardiomyopathy, chronic cancer related pain, and hypothyroidism, now presenting to emergency department for evaluation of shortness of breath and increased left pleural effusion noted on outpatient imaging.  Patient reports insidiously worsening dyspnea over the past 6 weeks or so, now only able to walk several steps.  Assessment/Plan:   Principal Problem:   Recurrent pleural effusion on left Active Problems:   CAD S/P multiple PCIs   Cardiomyopathy, ischemic-EF 45-50% by 2D Oct 2015   Essential hypertension   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HCC)   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) ([place])   Malignant neoplasm of upper lobe of left lung (HCC)   Chest wall pain, chronic   Pleural effusion  1. Recurrent left pleural effusion  - Presents with insidiously worsening SOB and found to have enlarging left-sided pleural effusion  - No fever or leukocytosis to suggest infectious etiology - s/p thoracentesis and appears exudative  -discussed palliative care with wife and patient -- consult placed Home O2 study  2. Lung cancer  - Patient is following with oncology/radiation oncology-- does not want chemo-- palliative care consult  3. COPD  - No wheezing or cough on admission  - Continue inhalers   4. CAD  - Has chronic left chest wall pain related to metastasis, but no anginal complaints  - Continue ASA 81 and beta-blocker    5. Ischemic cardiomyopathy  - Appears compensated  - Continue beta-blocker but with increased dose  6. 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 Hypothyroidism   - Continue Synthroid     Family Communication/Anticipated D/C date and plan/Code Status   DVT prophylaxis:scd Code Status: Full Code.  Family Communication: called wife 4/22 Disposition Plan: prior to d/c needs: 1. Home O2 study 2. palliative care consult for [place]-- patient does not want chemo--- he will need to be monitored closely for speed of re-accumulation of pleural fluid   Medical Consultants:    IR  Subjective:   Breathing better after thoracentesis and with O2  Objective:    Vitals:   08/25/18 1100 08/25/18 2035 08/26/18 0544 08/26/18 1017  BP:  138/71 121/79 116/78  Pulse: 85 95  (!) 114  Resp: 20 (!) 24  (!) 25  Temp:  (!) 96.4 F (35.8 C) 98.8 F (37.1 C) 98.1 F (36.7 C)  TempSrc:  Axillary Axillary Axillary  SpO2: 95% 99%  95%  Weight:   69.6 kg   Height:        Intake/Output Summary (Last 24 hours) at 08/26/2018 1108 Last data filed at 08/26/2018 0900 Gross per 24 hour  Intake 900 ml  Output --  Net 900 ml   Filed Weights   08/24/18 1747 08/24/18 2226 08/26/18 0544  Weight: 70.3 kg 72.2 kg 69.6 kg    Exam: In bed, appears more comfortable Tachy Improved aeration on right and mildly on left-- no wheezing A+ox3- speech slightly impaired  Data Reviewed:   I have personally reviewed following labs and imaging studies:  Labs: Labs show the  following:   Basic Metabolic Panel: Recent Labs  Lab 08/24/18 1911 08/25/18 0320  NA 133* 132*  K 4.1 4.6  CL 95* 96*  CO2 25 23  GLUCOSE 80 87  BUN <5* <5*  CREATININE 0.61 0.73  CALCIUM 9.5 9.3   GFR Estimated Creatinine Clearance: 92.6 mL/min (by C-G formula based on SCr of 0.73 mg/dL). Liver Function Tests: Recent Labs  Lab 08/24/18 1911 08/25/18 0320  AST 16 15  ALT 9 10  ALKPHOS 84 88  BILITOT 0.7 1.1  PROT 7.2 6.9  ALBUMIN 2.7* 2.7*   No results for input(s): LIPASE, AMYLASE in the last 168 hours. No  results for input(s): AMMONIA in the last 168 hours. Coagulation profile No results for input(s): INR, PROTIME in the last 168 hours.  CBC: Recent Labs  Lab 08/24/18 1911 08/25/18 0320  WBC 5.6 5.7  NEUTROABS  --  4.1  HGB 12.8* 13.0  HCT 39.2 39.7  MCV 100.0 98.3  PLT 225 263   Cardiac Enzymes: Recent Labs  Lab 08/24/18 1911  TROPONINI <0.03   BNP (last 3 results) No results for input(s): PROBNP in the last 8760 hours. CBG: No results for input(s): GLUCAP in the last 168 hours. D-Dimer: No results for input(s): DDIMER in the last 72 hours. Hgb A1c: No results for input(s): HGBA1C in the last 72 hours. Lipid Profile: No results for input(s): CHOL, HDL, LDLCALC, TRIG, CHOLHDL, LDLDIRECT in the last 72 hours. Thyroid function studies: No results for input(s): TSH, T4TOTAL, T3FREE, THYROIDAB in the last 72 hours.  Invalid input(s): FREET3 Anemia work up: No results for input(s): VITAMINB12, FOLATE, FERRITIN, TIBC, IRON, RETICCTPCT in the last 72 hours. Sepsis Labs: Recent Labs  Lab 08/24/18 1911 08/25/18 0320  WBC 5.6 5.7    Microbiology Recent Results (from the past 240 hour(s))  Gram stain     Status: None   Collection Time: 08/25/18  9:58 AM  Result Value Ref Range Status   Specimen Description PLEURAL LEFT  Final   Special Requests NONE  Final   Gram Stain   Final    NO WBC SEEN NO ORGANISMS SEEN Performed at [facility], [address], [place], [place] [address]    Report Status 08/25/2018 FINAL  Final    Procedures and diagnostic studies:  Dg Chest 1 View  Result Date: 08/25/2018 CLINICAL DATA:  Status post left-sided thoracentesis EXAM: CHEST  1 VIEW COMPARISON:  August 24, 2018 FINDINGS: No evident pneumothorax. Left pleural effusion is somewhat smaller following thoracentesis. There remains pleural fluid on the left with consolidation in the left base. There is a rather minimal right pleural effusion with mild right base atelectasis. Heart  is mildly enlarged with pulmonary vascularity normal. No adenopathy. Surgical clips noted the left. IMPRESSION: No pneumothorax. Left pleural effusion smaller following thoracentesis. There does remain pleural fluid on the left with minimal pleural effusion on the right. There is consolidation in the left base which may represent atelectasis or possible degree of pneumonia. Both atelectasis and pneumonia may be present concurrently. Stable cardiac prominence. Electronically Signed   By: Lowella Grip III M.D.   On: 08/25/2018 10:08   Dg Chest 2 View  Result Date: 08/24/2018 CLINICAL DATA:  63 year old with current history of metastatic LEFT UPPER LOBE lung cancer, presenting with chronic persistent shortness of breath for at least 2 months, associated with productive cough. EXAM: CHEST - 2 VIEW COMPARISON:  07/21/2018 and earlier, including CT chest 06/03/2018. FINDINGS: Since the  examination 1 month ago, interval increase in size of the now moderate to large LEFT pleural effusion and development of consolidation in the LEFT LOWER LOBE. Cardiac silhouette moderately enlarged, unchanged. Pulmonary vascularity normal without evidence of pulmonary edema. Stable chronic pleuroparenchymal scarring at the RIGHT lung base. RIGHT lung otherwise clear. Metallic fiducial markers in the LEFT UPPER LOBE as previously noted. The previously identified LEFT LATERAL sixth rib fracture is not conspicuous. IMPRESSION: 1. Since the examination 1 month ago, increase in size of the now moderate to large LEFT pleural effusion and associated passive atelectasis and/or pneumonia involving the LEFT LOWER LOBE. 2. Stable cardiomegaly without evidence of pulmonary edema. Electronically Signed   By: Evangeline Dakin M.D.   On: 08/24/2018 12:34   Dg Chest Port 1 View  Result Date: 08/24/2018 CLINICAL DATA:  Shortness of breath EXAM: PORTABLE CHEST 1 VIEW COMPARISON:  08/24/2018 FINDINGS: Moderate left pleural effusion with left  lower lobe atelectasis or infiltrate, both slightly worsening since prior study. Small right effusion with right base atelectasis. Cardiomegaly. No acute bony abnormality. IMPRESSION: Moderate left pleural effusion and left lower lobe atelectasis or infiltrate, both worsening since prior study. Small right effusion with right base atelectasis. Cardiomegaly. Electronically Signed   By: Rolm Baptise M.D.   On: 08/24/2018 19:29   Ir Thoracentesis Asp Pleural Space W/img Guide  Result Date: 08/25/2018 INDICATION: Patient with history tongue cancer, lung cancer, COPD, dyspnea, and left pleural effusion. Request is made for diagnostic and therapeutic left thoracentesis. EXAM: ULTRASOUND GUIDED DIAGNOSTIC AND THERAPEUTIC LEFT THORACENTESIS MEDICATIONS: 10 mL 1% lidocaine COMPLICATIONS: None immediate. PROCEDURE: An ultrasound guided thoracentesis was thoroughly discussed with the patient and questions answered. The benefits, risks, alternatives and complications were also discussed. The patient understands and wishes to proceed with the procedure. Written consent was obtained. Ultrasound was performed to localize and mark an adequate pocket of fluid in the left chest. The area was then prepped and draped in the normal sterile fashion. 1% Lidocaine was used for local anesthesia. Under ultrasound guidance a 6 Fr Safe-T-Centesis catheter was introduced. Thoracentesis was performed. The catheter was removed and a dressing applied. FINDINGS: A total of approximately 1.7 L of dark red/blood-tinged fluid was removed. Procedure was stopped 1.7 L due to patient discomfort. Samples were sent to the laboratory as requested by the clinical team. IMPRESSION: Successful ultrasound guided left thoracentesis yielding 1.7 L of pleural fluid. Read by: Earley Abide, PA-C Electronically Signed   By: Aletta Edouard M.D.   On: 08/25/2018 10:21    Medications:    aspirin EC  81 mg Oral Daily   feeding supplement (ENSURE ENLIVE)  1  Bottle Oral QID   levothyroxine  175 mcg Oral QAC breakfast   metoprolol tartrate  25 mg Oral BID   pantoprazole  40 mg Oral Daily   sodium chloride flush  3 mL Intravenous Q12H   umeclidinium bromide  1 puff Inhalation Daily   Continuous Infusions:  sodium chloride       LOS: 1 day   Geradine Girt  [facility]   How to contact the [facility] Attending or Consulting provider [place] or covering provider during after hours [place], for this patient?  1. Check the care team in [facility] and look for a) attending/consulting TR[facility] provider listed and b) the [facility] team listed 2. Log into [email or web address] and use [place]'s universal password to access. If you do not have the password, please contact the hospital operator. 3. Locate  the [facility] provider you are looking for under [facility] and page to a number that you can be directly reached. 4. If you still have difficulty reaching the provider, please page the [facility] (Director on Call) for the Hospitalists listed on amion for assistance.  08/26/2018, 11:08 AM

## 2018-08-26 NOTE — Progress Notes (Signed)
   Patient Saturations on Room Air at Rest = 98%  Patient Saturations on [employer] while Ambulating = 74%  Patient Saturations on 2 Liters of oxygen while Ambulating = 98%  Please briefly explain why patient needs home oxygen:Oxygen Therapy Ambulated pt for walking o2 sats. At rest pt's 02 level is 98% with 2LNC. Made attempt to walk 33ft w/out oxygen. Dropped to 74%  SATURATION QUALIFICATIONS: (This note is used to comply with regulatory documentation for home oxygen)  Treatment Indications: Oxygen therapy indicated    Treatment Goals: Goal is to treat hypoxia/hypoxemia    Plan of Care: Nasal cannula (liters/minute);  2 LPM oxygen but pt 02 level dropped to 74%. Returned pt back to room, put back on [facility] which returned oxygen back to 98%.

## 2018-08-26 NOTE — Progress Notes (Signed)
Initial Nutrition Assessment  DOCUMENTATION CODES:   Severe malnutrition in context of chronic illness  INTERVENTION:    Ensure Enlive po TID, each supplement provides 350 kcal and 20 grams of protein  Provide MVI daily  NUTRITION DIAGNOSIS:   Severe Malnutrition related to chronic illness, cancer and cancer related treatments as evidenced by energy intake < or equal to 75% for > or equal to 1 month, percent weight loss.  GOAL:   Patient will meet greater than or equal to 90% of their needs  MONITOR:   PO intake, Supplement acceptance, Weight trends, Labs, I & O's  REASON FOR ASSESSMENT:   Malnutrition Screening Tool    ASSESSMENT:   Patient with PMH significant for tongue cancer, metastatic lung cancer, COPD, CAD s/p multiple PCIs, ischemic cardiomyopathy, chronic pain, and hypothyroidism. Presents this admission with recurrent left PE.    4/22- left thoracentesis 1.7 L drained   RD working remotely.  Plan for meeting with palliative care as pt does not wish to undergo chemotherapy treatments.   Spoke with pt via phone. Pt states his appetite began to decrease slowly over the last year. Reports he had swallowing issues since 2014 after they removed some of his tongue due to cancer. Pt usually has two meals daily that consist of softer food options like chili, mashed potatoes with gravy, mac & cheese, deli sandwiches with milk, and soups. Intake dropped to one meal daily over the last couple of weeks likely due to fluid accumulation/difficulty breathing. Pt used to drink Ensure but noticed the thicker consistency was harder for him to swallow. Speech assessed him yesterday and recommended DYS 3 diet with thin liquids. Encouraged pt to drink Ensure while sitting up and to drink slowly. Pt amendable.   Pt endorses a UBW of 190 lb, the last time being at that weight in 2018. Pt unsure if he has lost wt recently but states he switched back to his smaller pants that he wore when  he lost weight after tongue surgery in 2014. Per chart records pt weighed 169 lb on 3/18 and 153 lb this admission (s/p thoracentesis). This shows a 9.5% wt loss in one month, significant for time frame.   Unable to complete Nutrition-Focused physical exam at this time. Suspect pt is severely malnourished with evidence of intake and weight loss.   Medications: reviewed  Labs: Na 132 (L)   Diet Order:   Diet Order            Diet regular Room service appropriate? Yes with Assist; Fluid consistency: Thin  Diet effective now              EDUCATION NEEDS:   Education needs have been addressed  Skin:  Skin Assessment: Skin Integrity Issues: Skin Integrity Issues:: Incisions Incisions: left upper back  Last BM:  4/22  Height:   Ht Readings from Last 1 Encounters:  08/24/18 _0  (1.727 m)    Weight:   Wt Readings from Last 1 Encounters:  08/26/18 69.6 kg    Ideal Body Weight:  70 kg  BMI:  Body mass index is 23.34 kg/m.  Estimated Nutritional Needs:   Kcal:  2200-2400 kcal  Protein:  110-130 grams  Fluid:  >/= 2.2 L/day    Trevor Griffin RD, LDN Clinical Nutrition Pager # - [phone]

## 2018-08-26 NOTE — Consult Note (Signed)
Consultation Note Date: 08/26/2018   Patient Name: Trevor Griffin  DOB: [birth year]  MRN: [id number]  Age / Sex: 63 y.o., male   PCP: Janith Lima, MD Referring Physician: Geradine Girt, DO   REASON FOR CONSULTATION:Establishing goals of care  Palliative Care consult requested for this 63 y.o. 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 .   Clinical Assessment and Goals of Care: I have reviewed medical records including lab results, imaging, Epic notes, and MAR, received report from the bedside RN, and assessed the patient. I met at the bedside with patient to discuss diagnosis prognosis, GOC, EOL wishes, disposition and options. Trevor Griffin is awake, alert, and oriented x3. He expresses he feels much better today since he had the thoracentesis yesterday. States it is nice to sleep on his side again. Denied pain.   I introduced Palliative Medicine as specialized medical care for people living with serious illness. It focuses on providing relief from the symptoms and stress of a serious illness. The goal is to improve quality of life for both the patient and the family.  We discussed a brief life review of the patient, along with his functional and nutritional status. He reports he does not have any children. He and his wife has been married for 25 years, and they have a dog and 3 cats. 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   Prior to admission he reports being ambulatory without assistive devices. He was still driving and performing all ADLs independently. He reports over the past few weeks he has become more fatigued and short of breath. He states he could barely walk short distances around his  home without having to stop or feeling extremely short of breath and fatigued. He did not use oxygen at home prior to admission. He states his appetite has been fair since half of his tongue being removed in 2014. He had a PEG at that time but was later removed. He does endorse weight loss (198lb 2018 down to 153lbs this admission). He reports he has no taste buds, he has esophageal narrowing and was told this could not be stretched, and is limited to certain foods he can eat without choking.   We discussed His current illness and what it means in the larger context of His on-going co-morbidities. With specific discussions regarding his metastatic cancer and recurrent pleural effusions. Natural disease trajectory and expectations at EOL were discussed.  Trevor Griffin verbalized understanding of his condition. He expressed he understood the likelihood of reoccurring pleural effusions and that he was ok with being closely monitored for future thoracentesis or consideration for pleurx if needed as mentioned by providers. He is adamant that he is not interested in any forms of chemotherapy or immunotherapy. I attempted to discuss the progression of his disease given his wishes for no interventions (systemically). He verbalized understanding and expressed he has seen to many of his family members go through the process and he would rather spend his days he has left happy and not with any additional health complications on top of his current list. When asked to elaborate he expressed "I am not going to take treatments and develop side effects or feel worst. I have good and bad days now  and I am not interested in my bad days being worst than they already are and I am not going to gamble to see if I get sick or not!" Support given.   I attempted to elicit values and goals of care important to the patient.    The difference between aggressive medical intervention and comfort care was considered in light of the  patient's goals of care. At this time he is requesting to continue to treat. He expressed he knows his time is limited however, he wants to continue to manage his care until he is unable to go or do for himself and then he will knows the fight is over.   Trevor Griffin expressed he did not have an advanced directive however his wife would be his medical decision maker if decisions need to be made. I discussed in details his current full code status with consideration to his current illness and co-morbidities. He expressed he would not want heroic measures such as CPR, defibrillation, or intubation. I explained given his expressed wishes we could change his code status to DNR/DNI to allow medical team to be aware of his wishes. Patient has declined to change status and expressed this is what he wants however, he did not want it changed until he could personally discuss it with his wife because they only briefly discussed in the past. He expressed he would not want something to happen and she not know that he chose this for himself. I offered to contact his wife for him and allow him to express his wishes, however he declined and stated he would call her later. He is aware at this point if anything was to happen emergently he would be considered a full code. He verbalized understanding and stated he would discuss with her and let nurse or medical team know to change status.   Hospice and Palliative Care services outpatient were explained and offered. Patient verbalized their understanding and awareness of both palliative and hospice's goals and philosophy of care. He expressed his provider had previously suggested hospice however he declined. He reports he still feels good about his current quality of life and function. He declines hospice at this time but does express he would like palliative to be involved at discharge. He is aware that at any point he may transition to hospice by discussing with his outpatient  team.   Questions and concerns were addressed.The family was encouraged to call with questions or concerns.  PMT will continue to support holistically.   SOCIAL HISTORY:     reports that he has quit smoking. His smoking use included cigars. He quit after 46.00 years of use. He has never used smokeless tobacco. He reports current alcohol use of about 3.0 standard drinks of alcohol per week. He reports that he does not use drugs.  CODE STATUS: Full code  ADVANCE DIRECTIVES: Primary Decision Maker: Patient   SYMPTOM MANAGEMENT: per attending   Palliative Prophylaxis:   Aspiration, Bowel Regimen and Frequent Pain Assessment  PSYCHO-SOCIAL/SPIRITUAL:  Support System: Wife   Desire for further Chaplaincy support:NO   Additional Recommendations (Limitations, Scope, Preferences):  Full Scope Treatment   PAST MEDICAL HISTORY: Past Medical History:  Diagnosis Date   Acute ST elevation myocardial infarction (STEMI) involving left anterior descending (LAD) coronary artery ([place]) 2008 and 2009   5/'08 - DES x 4 to p-m LAD; 2/'09 - PTCA of  in-stent thrombosis   Alcohol abuse    Anxiety    CAD S/P  percutaneous coronary angioplasty 2008   a. Ant STEMI 5/08 w/ 4 Taxus DES p-m LAD (3.0 x 16,16,24,28 mm) ;  b. 2/'09:  Stopped Plavix-> Ant STEMI/stent thrombosis in LAD (PTCA - 3.8 mm), LCX 49md;  c. 10/12: Cath/PCI: p-m LAD stents 10-20% ISR, mLAD 40%, CFX 30-40%, mCFX 90% (Promus Premier DES 3.0 mm x 20 mm - 3.227m), EF 35%; d. cath 2/17/'16 no Sig CAD, patent LAD & Cx stents, EF 40-45%   Chronic pain    COPD (chronic obstructive pulmonary disease) (HCC)    Depression    Fatigue    Sleep study 6/08 was negative. Sleep study 5/13 showed no OSA.   GERD (gastroesophageal reflux disease)    Headache(784.0)    migraines   History of radiation therapy 05/27/2018- 06/02/2018   Left 6th rib tumor, 5 Gy X 5 fractions for a total dose of 25 Gy.    Hx of radiation therapy 10/25/12-12/10/12     Right Tongue/ Bilateral Neck //Total Dose, 60 Gy in 30 Fractions   Hyperlipidemia    Hypertension    Ischemic cardiomyopathy    a. Echo 08/20/11 with EF 50-55% (previously 35%);  b. 02/2014 Echo: EF 45-50-%, mid-apicalanteroseptal AK, Gr 1 dd, mild AI, midlly dil LA.   Lung cancer ([facility] 2015   Obesity    Pleural effusion 08/2018   recurrent    Tobacco abuse    Tongue cancer ([facility] 2014   [place]/p resection - [place]ilical her[place]     PAST SURGICAL HISTORY:  Past Surgical History:  Procedure Laterality Date   COLONOSCOPY N/A 11/25/2016   Procedure: COLONOSCOPY;  Surgeon: MaClarene EssexMD;  Location: [facility];  Service: Endoscopy;  Laterality: N/A;   CORONARY ANGIOPLASTY WITH STENT PLACEMENT  2008-2012   a. 5/'08: Anterior STEMI: Proximal-mid LAD PCI: 4 total Taxus DES stents 3.0 mm x  16 mm, 16 mm, 24 mm and 28 mm.( post-dil 3.5 mm);; b. 2/'09: PTCA LAD in-stent thrombosis - 3.8 mm;; c. 10/'12: mCx Promus Element DES 3.0 x 20 mm- post-dil - 3.25 mm. -> patent in 2016   ESOPHAGOGASTRODUODENOSCOPY (EGD) WITH PROPOFOL N/A 11/25/2016   Procedure: ESOPHAGOGASTRODUODENOSCOPY (EGD) WITH PROPOFOL;  Surgeon: MaClarene EssexMD;  Location: [facility];  Service: Endoscopy;  Laterality: N/A;  neonatal scope   FRACTURE SURGERY Right    as a child,leg   FUDUCIAL PLACEMENT N/A 01/15/2018   Procedure: PLACEMENT OF FUDUCIAL;  Surgeon: HeMelrose NakayamaMD;  [facility] Service: Thoracic;  Laterality: N/A;   IR THORACENTESIS ASP PLEURAL SPACE W/IMG GUIDE  08/25/2018   LEFT HEART CATH AND CORONARY ANGIOGRAPHY  2006-2012   a) 2006: minimal CAD, normal EF;; b) 5/'08 - Ant STEMI - 100% p-mLAD (DES x 4 in LAD);; c)2/'09: Ant STEMI for IST/thrombosis of LAD ->PTCA, 80% mCx;; 10/'12: mCx 80-90% - DES PCI, patent LAD stents, EF ~35% - anterolateral AK, apical severe HK.   LEFT HEART CATHETERIZATION WITH CORONARY ANGIOGRAM N/A 06/21/2014   Procedure: LEFT HEART CATHETERIZATION  WITH CORONARY ANGIOGRAM;  Surgeon: ChBurnell BlanksMD;  [facility]: No evidence of obstructive disease.  Patent LAD and circumflex stents.  EF 40-45%.   LOBECTOMY Right 06/06/2013   Procedure: LOBECTOMY;  Surgeon: StMelrose NakayamaMD;  [facility] Service: Thoracic;  Laterality: Right;   TRANSTHORACIC ECHOCARDIOGRAM  02/2014   EF 45-50%.  Akinesis of the mid apical anteroseptal wall.  GR 1 DD.  Mild MR.  VIDEO ASSISTED THORACOSCOPY (VATS)/WEDGE RESECTION Right 06/06/2013   Procedure: VIDEO ASSISTED THORACOSCOPY (VATS)/WEDGE RESECTION;  Surgeon: Melrose Nakayama, MD;  Location: [place];  Service: Thoracic;  Laterality: Right;   VIDEO BRONCHOSCOPY WITH ENDOBRONCHIAL NAVIGATION N/A 01/15/2018   Procedure: VIDEO BRONCHOSCOPY WITH ENDOBRONCHIAL NAVIGATION;  Surgeon: Melrose Nakayama, MD;  [facility];  Service: Thoracic;  Laterality: N/A;    ALLERGIES:  is allergic to hydromorphone and nubain [nalbuphine hcl].   MEDICATIONS:  Current Facility-Administered Medications  Medication Dose Route Frequency Provider Last Rate Last Dose   0.9 %  sodium chloride infusion  250 mL Intravenous PRN Opyd, Ilene Qua, MD       acetaminophen (TYLENOL) tablet 650 mg  650 mg Oral Q6H PRN Opyd, Ilene Qua, MD   650 mg at 08/25/18 2671   Or   acetaminophen (TYLENOL) suppository 650 mg  650 mg Rectal Q6H PRN Opyd, Ilene Qua, MD       aspirin EC tablet 81 mg  81 mg Oral Daily Opyd, Ilene Qua, MD   81 mg at 08/26/18 1026   cyclobenzaprine (FLEXERIL) tablet 5 mg  5 mg Oral BID PRN Vianne Bulls, MD   5 mg at 08/26/18 0610   feeding supplement (ENSURE ENLIVE) (ENSURE ENLIVE) liquid 237 mL  1 Bottle Oral QID Vianne Bulls, MD   Stopped at 08/25/18 0056   levothyroxine (SYNTHROID) tablet 175 mcg  175 mcg Oral QAC breakfast Vianne Bulls, MD   175 mcg at 08/26/18 2458   lidocaine (XYLOCAINE) 1 % (with pres) injection   Infiltration PRN Earley Abide M, PA-C   10 mL at  08/25/18 0998   metoprolol tartrate (LOPRESSOR) tablet 25 mg  25 mg Oral BID Vann, Jessica U, DO   25 mg at 08/26/18 1026   multivitamin with minerals tablet 1 tablet  1 tablet Oral Daily Vann, Jessica U, DO       ondansetron (ZOFRAN) tablet 4 mg  4 mg Oral Q6H PRN Opyd, Ilene Qua, MD       Or   ondansetron (ZOFRAN) injection 4 mg  4 mg Intravenous Q6H PRN Opyd, Ilene Qua, MD       oxyCODONE (Oxy IR/ROXICODONE) immediate release tablet 10 mg  10 mg Oral TID PRN Opyd, Ilene Qua, MD   10 mg at 08/26/18 1439   pantoprazole (PROTONIX) EC tablet 40 mg  40 mg Oral Daily Opyd, Ilene Qua, MD   40 mg at 08/26/18 1026   senna-docusate (Senokot-S) tablet 1 tablet  1 tablet Oral QHS PRN Opyd, Ilene Qua, MD       sodium chloride flush (NS) 0.9 % injection 3 mL  3 mL Intravenous Q12H Opyd, Ilene Qua, MD   3 mL at 08/25/18 2049   sodium chloride flush (NS) 0.9 % injection 3 mL  3 mL Intravenous PRN Opyd, Ilene Qua, MD       umeclidinium bromide (INCRUSE ELLIPTA) 62.5 MCG/INH 1 puff  1 puff Inhalation Daily Hammons, Kimberly B, RPH   1 puff at 08/25/18 0828    VITAL SIGNS: BP 92/70 (BP Location: Left Arm)    Pulse 90    Temp 97.7 F (36.5 C) (Axillary) Comment (Src): clicked the wrong one   Resp 20    Ht 5' 8"  (1.727 m)    Wt 69.6 kg    SpO2 100%    BMI 23.34 kg/m  Filed Weights   08/24/18 1747 08/24/18 2226 08/26/18 0544  Weight: 70.3 kg 72.2  kg 69.6 kg    Estimated body mass index is 23.34 kg/m as calculated from the following:   Height as of this encounter: 5' 8"  (1.727 m).   Weight as of this encounter: 69.6 kg.  LABS: CBC:    Component Value Date/Time   WBC 5.7 08/25/2018 0320   HGB 13.0 08/25/2018 0320   HGB 10.4 (L) 03/04/2017 1509   HGB 11.4 (L) 01/12/2017 0845   HCT 39.7 08/25/2018 0320   HCT 33.6 (L) 03/04/2017 1509   HCT 38.2 (L) 01/12/2017 0845   PLT 263 08/25/2018 0320   PLT 225 03/04/2017 1509   Comprehensive Metabolic Panel:    Component Value Date/Time   NA 132  (L) 08/25/2018 0320   NA 137 03/04/2017 1509   NA 135 (L) 01/12/2017 0845   K 4.6 08/25/2018 0320   K 4.7 01/12/2017 0845   CO2 23 08/25/2018 0320   CO2 24 01/12/2017 0845   BUN <5 (L) 08/25/2018 0320   BUN 10 03/04/2017 1509   BUN 8.0 01/12/2017 0845   CREATININE 0.73 08/25/2018 0320   CREATININE 0.9 01/12/2017 0845   ALBUMIN 2.7 (L) 08/25/2018 0320   ALBUMIN 4.2 03/04/2017 1509   ALBUMIN 3.6 01/12/2017 0845     Review of Systems  Constitutional: Positive for fatigue.  Respiratory: Positive for shortness of breath.   Neurological: Positive for weakness.  All other systems reviewed and are negative.    Physical Exam Vitals signs and nursing note reviewed.  Constitutional:      General: He is awake.     Appearance: He is well-developed. He is ill-appearing.     Comments: Chronically ill appearing   Cardiovascular:     Rate and Rhythm: Normal rate and regular rhythm.     Pulses: Normal pulses.     Heart sounds: Normal heart sounds.  Pulmonary:     Effort: Tachypnea present.     Breath sounds: Decreased breath sounds present.  Abdominal:     General: Bowel sounds are normal.     Palpations: Abdomen is soft.  Skin:    General: Skin is warm and dry.  Neurological:     Mental Status: He is alert and oriented to person, place, and time.  Psychiatric:        Behavior: Behavior is cooperative.   Prognosis: < 6 months in the setting of tongue cancer, metastatic lung cancer with rib lesion, ischemic cardiomyopathy, COPD, worsening shortness of breath, coronary artery disease w/p multiple PCIs, hypertension, recurrent left pleural effusion requiring thoracentesis (1.7L), and chronic cancer related   Discharge Planning:  Home with Palliative Services  Recommendations:  Full Code-patient stated he does not want heroic measures but does not want his code status changed until he can further discuss with his wife. Recommended he have a discussion with her today to make his  wishes known in the event of an emergent event requiring measures.   Continue to treat. He does not want any forms of chemotherapy/immunotherapy. States he would be ok with thoracentesis or pleurx cath if needed.   Outpatient palliative at dishcharge (CM referral placed)  PMT will continue to support as needed   Palliative Performance Scale:PPS 30%              Patient expressed understanding and was in agreement with this plan.   Thank you for allowing the Palliative Medicine Team to assist in the care of this patient.  Time In: 1330 Time Out: 1445 Time Total: 75 min.  Visit consisted of counseling and education dealing with the complex and emotionally intense issues of symptom management and palliative care in the setting of serious and potentially life-threatening illness.Greater than 50%  of this time was spent counseling and coordinating care related to the above assessment and plan.  Signed by:  Alda Lea, AGPCNP-BC Palliative Medicine Team  Phone: [phone] Fax: [phone] Pager: [phone] Amion: Bjorn Pippin

## 2018-08-27 ENCOUNTER — Telehealth: Payer: Self-pay | Admitting: Internal Medicine

## 2018-08-27 DIAGNOSIS — E43 Unspecified severe protein-calorie malnutrition: Secondary | ICD-10-CM

## 2018-08-27 MED ORDER — GLYCOPYRROLATE 25 MCG/ML IN SOLN: 1.0000 | Freq: Two times a day (BID) | RESPIRATORY_TRACT | 5 refills | Status: AC

## 2018-08-27 MED ORDER — ALBUTEROL SULFATE (2.5 MG/3ML) 0.083% IN NEBU: 2.5000 mg | INHALATION_SOLUTION | Freq: Four times a day (QID) | RESPIRATORY_TRACT | 12 refills | Status: AC | PRN

## 2018-08-27 NOTE — Telephone Encounter (Signed)
Copied from [place] [id number]. Topic: Referral - Status >> Aug 27, 2018 12:44 PM Gustavus Messing wrote: Reason for CRM: [facility] collective wanted Dr. Ronnald Ramp to know that they received the referral for Trevor Griffin  Call [place]

## 2018-08-27 NOTE — TOC Transition Note (Signed)
Transition of Care [facility]) - CM/SW Discharge Note Marvetta Gibbons RN, BSN Transitions of Care Unit 4E- RN Case Manager [phone]  Patient Details  Name: Trevor Griffin MRN: [id number] Date of Birth: 25-Sep-1955  Transition [facility] Care [facility]) CM/SW Contact:  Dawayne Patricia, RN Phone Number: 08/27/2018, 11:21 AM   Clinical Narrative:    Pt admitted with recurrent pl. Effusion, stable for transition home. Referral received for home PC needs and home 02 DME. CM spoke with pt at bedside- per pt he would like f/u with community PC with [facility] (now [facility]) and also states he would like his home 02 with [facility] (now [facility])- pt reports that he wife will transport home and no other needs noted. Call made to Trixie Dredge with [facility] for [facility] referral per pt request. They will f/u with pt in community. Call also made to [facility] with [facility] for home 02 needs- portable 02 tank to be delivered to room prior to discharge- pt aware to call for his wife to come transport once 02 tank has been delivered to room.    Final next level of care: Home/Self Care Barriers to Discharge: No Barriers Identified   Patient Goals and CMS Choice Patient states their goals for this hospitalization and ongoing recovery are:: "to go home and not be so short of breath" CMS Medicare.gov Compare Post Acute Care list provided to:: Patient Choice offered to / list presented to : Patient  Discharge Placement  Home with wife and new home 02 setup                     Discharge Plan and Services   Discharge Planning Services: CM Consult Post Acute Care Choice: Durable Medical Equipment          DME Arranged: Oxygen DME Agency: [facility] Date DME Agency Contacted: 08/27/18 Time DME Agency Contacted: 78 Representative spoke with at DME Agency: [place] Arranged: NA          Social Determinants of Health ([place]) Interventions     Readmission Risk Interventions Readmission  Risk Prevention Plan 08/27/2018  Transportation Screening Complete  PCP or Specialist Appt within 5-7 Days Complete  Home Care Screening Complete  Medication Review (RN CM) Complete  Some recent data might be hidden

## 2018-08-27 NOTE — Telephone Encounter (Signed)
fyi

## 2018-08-27 NOTE — Progress Notes (Signed)
Hydrologist [facility])  Hospital Liaison: RN note.  Notified by Marvetta Gibbons CMRN of patient/family request for [place] services at home after discharge.  Spoke with patient and left a message for his wife.     Please call with any questions.     Thank you for this referral.     Farrel Gordon, RN, CCM  [facility] (listed on AMION under Hospice and [place] [facility] [place])  [phone]

## 2018-08-27 NOTE — Discharge Summary (Signed)
Physician Discharge Summary  [facility] [id number] DOB: 1955-07-27 DOA: 08/24/2018  PCP: Janith Lima, MD  Admit date: 08/24/2018 Discharge date: 08/27/2018  Admitted From: home Discharge disposition: home   Recommendations for Outpatient Follow-Up:   1. Home O2 2. 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  Diet recommendation: Low sodium, heart healthy  Wound care: None.  Code status: Full.   History of Present Illness:   Trevor Griffin is a 63 y.o. male with medical history significant for tongue cancer, metastatic lung cancer, COPD, coronary artery disease, ischemic cardiomyopathy, chronic cancer related pain, and hypothyroidism, now presenting to emergency department for evaluation of shortness of breath and increased left pleural effusion noted on outpatient imaging.  Patient reports insidiously worsening dyspnea over the past 6 weeks or so, now only able to walk several steps.  He has not had any fevers, reports occasional cough, and denies any lower extremity swelling or tenderness.  He was treated with Augmentin for possible pneumonia last month, but did not initiate any improvement with this.  He was evaluated for his progressive dyspnea at urgent care, noted to have increase in his left sided pleural effusion, and was recommended to follow-up with his PCP who was able to arrange outpatient thoracentesis, but he cannot be performed for a  couple days and so he was advised to go to the ED to see if the procedure could be expidited.   Hospital Course by Problem:   1.Recurrent left pleural effusion -Presents with insidiously worsening SOB and found to have enlarging left-sided pleural effusion -No fever or leukocytosis to suggest infectious etiology -s/p thoracentesis and appears exudative -discussed palliative care with wifeand patient --- will need outpatient follow up Home O2 study-- patient did drop while ambulating  2.Lung cancer -Patient is following with oncology/radiation oncology-- does not want chemo-- palliative care consult-- will need continued following  3.COPD -Continue inhalers  4.CAD -Has chronic left chest wall pain related to metastasis, but no anginal complaints -Continue ASA 81 and beta-blocker  5.Ischemic cardiomyopathy -Appears compensated -Continue beta-blocker  6.Chronic pain -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 -Continue home regimen with as-needed oxycodone and Flexeril  7.Hypothyroidism -Continue Synthroid     Medical Consultants:    Palliative care  Discharge Exam:   Vitals:   08/27/18 0444 08/27/18 0742  BP: 99/66   Pulse: 86   Resp: (!) 21   Temp: 98.3 F (36.8 C)   SpO2: 97% 100%   Vitals:   08/26/18 1228 08/26/18 1950 08/27/18 0444 08/27/18 0742  BP: 92/70 110/66 99/66   Pulse: 90 94 86   Resp: 20 19 (!) 21   Temp: 97.7 F (36.5 C) 98.5 F (36.9 C) 98.3 F (36.8 C)   TempSrc: Axillary Oral Oral   SpO2: 100% 99% 97% 100%  Weight:   70.3 kg   Height:        General exam: Appears calm and  comfortable. Feels like he is breathing better, diminished  The results of significant diagnostics from this hospitalization (including imaging, microbiology, ancillary and laboratory) are listed below for reference.     Procedures and Diagnostic Studies:   Dg Chest 1 View  Result Date: 08/25/2018 CLINICAL  DATA:  Status post left-sided thoracentesis EXAM: CHEST  1 VIEW COMPARISON:  August 24, 2018 FINDINGS: No evident pneumothorax. Left pleural effusion is somewhat smaller following thoracentesis. There remains pleural fluid on the left with consolidation in the left base. There is a rather minimal right pleural effusion with mild right base atelectasis. Heart is mildly enlarged with pulmonary vascularity normal. No adenopathy. Surgical clips noted the left. IMPRESSION: No pneumothorax. Left pleural effusion smaller following thoracentesis. There does remain pleural fluid on the left with minimal pleural effusion on the right. There is consolidation in the left base which may represent atelectasis or possible degree of pneumonia. Both atelectasis and pneumonia may be present concurrently. Stable cardiac prominence. Electronically Signed   By: Lowella Grip III M.D.   On: 08/25/2018 10:08   Dg Chest 2 View  Result Date: 08/24/2018 CLINICAL DATA:  63 year old with current history of metastatic LEFT UPPER LOBE lung cancer, presenting with chronic persistent shortness of breath for at least 2 months, associated with productive cough. EXAM: CHEST - 2 VIEW COMPARISON:  07/21/2018 and earlier, including CT chest 06/03/2018. FINDINGS: Since the examination 1 month ago, interval increase in size of the now moderate to large LEFT pleural effusion and development of consolidation in the LEFT LOWER LOBE. Cardiac silhouette moderately enlarged, unchanged. Pulmonary vascularity normal without evidence of pulmonary edema. Stable chronic pleuroparenchymal scarring at the RIGHT lung base. RIGHT lung otherwise clear. Metallic fiducial markers in the LEFT UPPER LOBE as previously noted. The previously identified LEFT LATERAL sixth rib fracture is not conspicuous. IMPRESSION: 1. Since the examination 1 month ago, increase in size of the now moderate to large LEFT pleural effusion and associated passive atelectasis and/or pneumonia  involving the LEFT LOWER LOBE. 2. Stable cardiomegaly without evidence of pulmonary edema. Electronically Signed   By: Evangeline Dakin M.D.   On: 08/24/2018 12:34   Dg Chest Port 1 View  Result Date: 08/24/2018 CLINICAL DATA:  Shortness of breath EXAM: PORTABLE CHEST 1 VIEW COMPARISON:  08/24/2018 FINDINGS: Moderate left pleural effusion with left lower lobe atelectasis or infiltrate, both slightly worsening since prior study. Small right effusion with right base atelectasis. Cardiomegaly. No acute bony abnormality. IMPRESSION: Moderate left pleural effusion and left lower lobe atelectasis or infiltrate, both worsening since prior study. Small right effusion with right base atelectasis. Cardiomegaly. Electronically Signed   By: Rolm Baptise M.D.   On: 08/24/2018 19:29   Ir Thoracentesis Asp Pleural Space W/img Guide  Result Date: 08/25/2018 INDICATION: Patient with history tongue cancer, lung cancer, COPD, dyspnea, and left pleural effusion. Request is made for diagnostic and therapeutic left thoracentesis. EXAM: ULTRASOUND GUIDED DIAGNOSTIC AND THERAPEUTIC LEFT THORACENTESIS MEDICATIONS: 10 mL 1% lidocaine COMPLICATIONS: None immediate. PROCEDURE: An ultrasound guided thoracentesis was thoroughly discussed with the patient and questions answered. The benefits, risks, alternatives and complications were also discussed. The patient understands and wishes to proceed with the procedure. Written consent was obtained. Ultrasound was performed to localize and mark an adequate pocket of fluid in the left chest. The area was then prepped and draped in the normal sterile fashion. 1% Lidocaine was used for local anesthesia. Under ultrasound guidance a 6 Fr Safe-T-Centesis catheter was introduced. Thoracentesis was performed. The  catheter was removed and a dressing applied. FINDINGS: A total of approximately 1.7 L of dark red/blood-tinged fluid was removed. Procedure was stopped 1.7 L due to patient discomfort.  Samples were sent to the laboratory as requested by the clinical team. IMPRESSION: Successful ultrasound guided left thoracentesis yielding 1.7 L of pleural fluid. Read by: Earley Abide, PA-C Electronically Signed   By: Aletta Edouard M.D.   On: 08/25/2018 10:21     Labs:   Basic Metabolic Panel: Recent Labs  Lab 08/24/18 1911 08/25/18 0320  NA 133* 132*  K 4.1 4.6  CL 95* 96*  CO2 25 23  GLUCOSE 80 87  BUN <5* <5*  CREATININE 0.61 0.73  CALCIUM 9.5 9.3   GFR Estimated Creatinine Clearance: 92.6 mL/min (by C-G formula based on SCr of 0.73 mg/dL). Liver Function Tests: Recent Labs  Lab 08/24/18 1911 08/25/18 0320  AST 16 15  ALT 9 10  ALKPHOS 84 88  BILITOT 0.7 1.1  PROT 7.2 6.9  ALBUMIN 2.7* 2.7*   No results for input(s): LIPASE, AMYLASE in the last 168 hours. No results for input(s): AMMONIA in the last 168 hours. Coagulation profile No results for input(s): INR, PROTIME in the last 168 hours.  CBC: Recent Labs  Lab 08/24/18 1911 08/25/18 0320  WBC 5.6 5.7  NEUTROABS  --  4.1  HGB 12.8* 13.0  HCT 39.2 39.7  MCV 100.0 98.3  PLT 225 263   Cardiac Enzymes: Recent Labs  Lab 08/24/18 1911  TROPONINI <0.03   BNP: Invalid input(s): POCBNP CBG: No results for input(s): GLUCAP in the last 168 hours. D-Dimer No results for input(s): DDIMER in the last 72 hours. Hgb A1c No results for input(s): HGBA1C in the last 72 hours. Lipid Profile No results for input(s): CHOL, HDL, LDLCALC, TRIG, CHOLHDL, LDLDIRECT in the last 72 hours. Thyroid function studies No results for input(s): TSH, T4TOTAL, T3FREE, THYROIDAB in the last 72 hours.  Invalid input(s): FREET3 Anemia work up No results for input(s): VITAMINB12, FOLATE, FERRITIN, TIBC, IRON, RETICCTPCT in the last 72 hours. Microbiology Recent Results (from the past 240 hour(s))  Gram stain     Status: None   Collection Time: 08/25/18  9:58 AM  Result Value Ref Range Status   Specimen Description  PLEURAL LEFT  Final   Special Requests NONE  Final   Gram Stain   Final    NO WBC SEEN NO ORGANISMS SEEN Performed at [facility], [address] [place], [place] [address]    Report Status 08/25/2018 FINAL  Final  Culture, body fluid-bottle     Status: None (Preliminary result)   Collection Time: 08/25/18  9:58 AM  Result Value Ref Range Status   Specimen Description PLEURAL LEFT  Final   Special Requests NONE  Final   Culture   Final    NO GROWTH 2 DAYS Performed at [facility], [place] [address], [place], [place] [address]    Report Status PENDING  Incomplete     Discharge Instructions:   Discharge Instructions    Diet general   Complete by:  As directed    Discharge instructions   Complete by:  As directed    Home O2   Increase activity slowly   Complete by:  As directed      Allergies as of 08/27/2018      Reactions   Hydromorphone Nausea Only, Other (See Comments)   Headaches also   Nubain [nalbuphine Hcl] Nausea Only,  Other (See Comments)   Headaches also      Medication List    STOP taking these medications   nicotine 14 mg/24hr patch Commonly known as:  [employer] NICOTINE TRANSDERMAL SYS   nicotine 7 mg/24hr patch Commonly known as:  NICODERM CQ - dosed in mg/24 hr     TAKE these medications   albuterol (2.5 MG/3ML) 0.083% nebulizer solution Commonly known as:  PROVENTIL Take 3 mLs (2.5 mg total) by nebulization every 6 (six) hours as needed for wheezing or shortness of breath.   aspirin 81 MG tablet Take 1 tablet (81 mg total) by mouth daily.   B-complex with vitamin C tablet Take 1 tablet daily by mouth.   cyclobenzaprine 5 MG tablet Commonly known as:  FLEXERIL Take 1 tablet (5 mg total) by mouth 2 (two) times daily as needed for muscle spasms.   dronabinol 2.5 MG capsule Commonly known as:  MARINOL Take 1 capsule (2.5 mg total) by mouth 2 (two) times daily before lunch and supper.   esomeprazole 20 MG capsule Commonly known as:   NEXIUM Take 20 mg by mouth daily.   feeding supplement (ENSURE ENLIVE) Liqd Take 237 mLs by mouth 4 (four) times daily.   Glycopyrrolate 25 MCG/ML Soln Commonly known as:  Scientist, research (medical) Refill Kit Inhale 1 puff into the lungs 2 (two) times daily. What changed:  Another medication with the same name was removed. Continue taking this medication, and follow the directions you see here.   levothyroxine 175 MCG tablet Commonly known as:  SYNTHROID Take 1 tablet (175 mcg total) by mouth daily before breakfast.   metoprolol tartrate 25 MG tablet Commonly known as:  LOPRESSOR Take 0.5 tablets (12.5 mg total) by mouth 2 (two) times daily.   nitroGLYCERIN 0.4 MG SL tablet Commonly known as:  NITROSTAT Place 1 tablet (0.4 mg total) under the tongue every 5 (five) minutes as needed (up to 3 doses). For chest pain   Oxycodone HCl 10 MG Tabs Take 1 tablet (10 mg total) by mouth 3 (three) times daily as needed. What changed:  reasons to take this   promethazine-dextromethorphan 6.25-15 MG/5ML syrup Commonly known as:  PROMETHAZINE-DM Take 5 mLs by mouth 4 (four) times daily as needed for cough.            Durable Medical Equipment  (From admission, onward)         Start     Ordered   08/27/18 0741  For home use only DME oxygen  Once    Question Answer Comment  Mode or (Route) Nasal cannula   Liters per Minute 2   Oxygen delivery system Gas      08/27/18 0740         Follow-up Information    Janith Lima, MD Follow up in 1 week(s).   Specialty:  Internal Medicine Why:  repeat chest x ray to see if/ how quickly fluid is returning to lung Contact information: [address] [place] [address] [phone]            Time coordinating discharge: 35 min  Signed:  Geradine Girt DO  [facility] 08/27/2018, 8:29 AM

## 2018-08-30 LAB — CULTURE, BODY FLUID W GRAM STAIN -BOTTLE: Culture: NO GROWTH

## 2018-08-31 ENCOUNTER — Other Ambulatory Visit: Payer: Self-pay

## 2018-08-31 ENCOUNTER — Telehealth: Payer: Self-pay | Admitting: Licensed Clinical Social Worker

## 2018-08-31 ENCOUNTER — Other Ambulatory Visit: Payer: Medicare Other | Admitting: Licensed Clinical Social Worker

## 2018-08-31 ENCOUNTER — Other Ambulatory Visit: Payer: Medicare Other | Admitting: *Deleted

## 2018-08-31 DIAGNOSIS — Z515 Encounter for palliative care: Secondary | ICD-10-CM

## 2018-08-31 NOTE — Progress Notes (Signed)
COMMUNITY PALLIATIVE CARE SW NOTE  PATIENT NAME: Trevor Griffin DOB: 26-Sep-1955 MRN: [id number]  PRIMARY CARE PROVIDER: Janith Lima, MD  RESPONSIBLE PARTY:  Acct ID - Guarantor Home Phone Work Phone Relationship Acct Type  [id number] [name]  Self P/F     [address], [place], [place] [address]   Due to the COVID-19 crisis, this virtual check-in visit was done via telephone from my office and it was initiated and consent given by this patient and or family.  PLAN OF CARE and INTERVENTIONS:             1. GOALS OF CARE/ ADVANCE CARE PLANNING:  Goal is for patient to remain at home with his wife, Trevor Griffin.  Patient is a full code. 2. SOCIAL/EMOTIONAL/SPIRITUAL ASSESSMENT/ INTERVENTIONS:  SW and Palliative Care RN, Trevor Griffin, conducted a joint virtual check-in visit with patient and his wife.  Trevor Griffin provided the majority of the answers.  SW provided active listening and supportive counseling as she discussed her husband's in-depth medical history.  He continues to have rib pain from the cancer.  RN addressed issue.  He slept in his recliner last night.  He was "having a bad day" per [place].  The couple has a good support system, including family and their pastor.  Provided education regarding DNR and MOST form.  She would like to review the MOST form.  Plan is for SW to mail copy to [facility] for review. 3. PATIENT/CAREGIVER EDUCATION/ COPING:  Provided education regarding virtual check-in visits.  Trevor Griffin requested phone calls and not video.  Both patient and his wife express their feelings openly. 4. PERSONAL EMERGENCY PLAN:  Wife will contact EMS.  If patient is in pain, he will limit his movement and put a heating pad on his back. 5. COMMUNITY RESOURCES COORDINATION/ HEALTH CARE NAVIGATION:  None. 6. FINANCIAL/LEGAL CONCERNS/INTERVENTIONS:  None indicated per wife.     SOCIAL HX:  Social History   Tobacco Use  . Smoking status: Former Smoker    Years: 46.00    Types: Cigars   . Smokeless tobacco: Never Used  . Tobacco comment: He has quit smoking recently, he is using a patch  Substance Use Topics  . Alcohol use: Yes    Alcohol/week: 3.0 standard drinks    Types: 3 Cans of beer per week    Comment: 3 beers 4 days a week    CODE STATUS:  Full Code  ADVANCED DIRECTIVES: N MOST FORM COMPLETE:  N HOSPICE EDUCATION PROVIDED:  Yes.  Trevor Griffin has had several family members under Hospice. PPS:  Patient's appetite has decreased and he has lost weight.  He can ambulate. Duration of visit and documentation:  30 minutes.      Trevor Corn Jazzmine Kleiman, LCSW

## 2018-08-31 NOTE — Telephone Encounter (Signed)
Palliative Care SW scheduled virtual check-in visit today at 12pm.

## 2018-09-06 ENCOUNTER — Ambulatory Visit (INDEPENDENT_AMBULATORY_CARE_PROVIDER_SITE_OTHER): Payer: Medicare Other | Admitting: Internal Medicine

## 2018-09-06 ENCOUNTER — Ambulatory Visit (INDEPENDENT_AMBULATORY_CARE_PROVIDER_SITE_OTHER)
Admission: RE | Admit: 2018-09-06 | Discharge: 2018-09-06 | Disposition: A | Discharge status: Home / Self Care | Payer: Medicare Other | Source: Ambulatory Visit | Attending: Internal Medicine | Admitting: Internal Medicine

## 2018-09-06 ENCOUNTER — Other Ambulatory Visit: Payer: Self-pay

## 2018-09-06 ENCOUNTER — Encounter: Payer: Self-pay | Admitting: Internal Medicine

## 2018-09-06 VITALS — BP 136/70 | HR 96 | Temp 97.4°F | Resp 20 | Ht 68.0 in | Wt 155.2 lb

## 2018-09-06 DIAGNOSIS — G893 Neoplasm related pain (acute) (chronic): Secondary | ICD-10-CM | POA: Diagnosis not present

## 2018-09-06 DIAGNOSIS — E039 Hypothyroidism, unspecified: Secondary | ICD-10-CM

## 2018-09-06 DIAGNOSIS — Z9861 Coronary angioplasty status: Secondary | ICD-10-CM | POA: Diagnosis not present

## 2018-09-06 DIAGNOSIS — J90 Pleural effusion, not elsewhere classified: Secondary | ICD-10-CM

## 2018-09-06 DIAGNOSIS — I2510 Atherosclerotic heart disease of native coronary artery without angina pectoris: Secondary | ICD-10-CM

## 2018-09-06 DIAGNOSIS — R0789 Other chest pain: Secondary | ICD-10-CM

## 2018-09-06 DIAGNOSIS — G8929 Other chronic pain: Secondary | ICD-10-CM

## 2018-09-06 DIAGNOSIS — C7951 Secondary malignant neoplasm of bone: Secondary | ICD-10-CM

## 2018-09-06 MED ORDER — MORPHINE SULFATE ER 15 MG PO TBCR: 15.0000 mg | EXTENDED_RELEASE_TABLET | Freq: Two times a day (BID) | ORAL | 0 refills | Status: AC

## 2018-09-06 MED ORDER — OXYCODONE HCL 10 MG PO TABS: 10.0000 mg | ORAL_TABLET | Freq: Three times a day (TID) | ORAL | 0 refills | Status: AC | PRN

## 2018-09-06 NOTE — Patient Instructions (Signed)
Cough, Adult  Coughing is a reflex that clears your throat and your airways. Coughing helps to heal and protect your lungs. It is normal to cough occasionally, but a cough that happens with other symptoms or lasts a long time may be a sign of a condition that needs treatment. A cough may last only 2-3 weeks (acute), or it may last longer than 8 weeks (chronic). What are the causes? Coughing is commonly caused by:  Breathing in substances that irritate your lungs.  A viral or bacterial respiratory infection.  Allergies.  Asthma.  Postnasal drip.  Smoking.  Acid backing up from the stomach into the esophagus (gastroesophageal reflux).  Certain medicines.  Chronic lung problems, including COPD (or rarely, lung cancer).  Other medical conditions such as heart failure. Follow these instructions at home: Pay attention to any changes in your symptoms. Take these actions to help with your discomfort:  Take medicines only as told by your health care provider. ? If you were prescribed an antibiotic medicine, take it as told by your health care provider. Do not stop taking the antibiotic even if you start to feel better. ? Talk with your health care provider before you take a cough suppressant medicine.  Drink enough fluid to keep your urine clear or pale yellow.  If the air is dry, use a cold steam vaporizer or humidifier in your bedroom or your home to help loosen secretions.  Avoid anything that causes you to cough at work or at home.  If your cough is worse at night, try sleeping in a semi-upright position.  Avoid cigarette smoke. If you smoke, quit smoking. If you need help quitting, ask your health care provider.  Avoid caffeine.  Avoid alcohol.  Rest as needed. Contact a health care provider if:  You have new symptoms.  You cough up pus.  Your cough does not get better after 2-3 weeks, or your cough gets worse.  You cannot control your cough with suppressant  medicines and you are losing sleep.  You develop pain that is getting worse or pain that is not controlled with pain medicines.  You have a fever.  You have unexplained weight loss.  You have night sweats. Get help right away if:  You cough up blood.  You have difficulty breathing.  Your heartbeat is very fast. This information is not intended to replace advice given to you by your health care provider. Make sure you discuss any questions you have with your health care provider. Document Released: 10/18/2010 Document Revised: 09/27/2015 Document Reviewed: 06/28/2014 Elsevier Interactive Patient Education  2019 [employer]  

## 2018-09-06 NOTE — Progress Notes (Signed)
Subjective:  Patient ID: Trevor Griffin, male    DOB: [birth year]  Age: 63 y.o. MRN: [id number]  CC: COPD and Cough   HPI [facility] presents for f/up - He was recently admitted and underwent a thoracentesis.  A large amount of fluid was removed.  The effusion was positive for malignant cells.  He continues to complain of shortness of breath.  He is doing better now that he is on continuous oxygen.  He complains of worsening pain in his left rib cage.  He tells me the oxycodone is not quite controlling the pain.  He also has neck and back pain.  He wants to see a surgeon to see if he can have a drain put in to treat the pleural effusion.  Outpatient Medications Prior to Visit  Medication Sig Dispense Refill   albuterol (PROVENTIL) (2.5 MG/3ML) 0.083% nebulizer solution Take 3 mLs (2.5 mg total) by nebulization every 6 (six) hours as needed for wheezing or shortness of breath. 75 mL 12   aspirin 81 MG tablet Take 1 tablet (81 mg total) by mouth daily. 30 tablet 1   B Complex-C (B-COMPLEX WITH VITAMIN C) tablet Take 1 tablet daily by mouth. 30 tablet 0   cyclobenzaprine (FLEXERIL) 5 MG tablet Take 1 tablet (5 mg total) by mouth 2 (two) times daily as needed for muscle spasms. 60 tablet 3   dronabinol (MARINOL) 2.5 MG capsule Take 1 capsule (2.5 mg total) by mouth 2 (two) times daily before lunch and supper. 60 capsule 0   esomeprazole (NEXIUM) 20 MG capsule Take 20 mg by mouth daily.      feeding supplement, ENSURE ENLIVE, (ENSURE ENLIVE) LIQD Take 237 mLs by mouth 4 (four) times daily. 120 Bottle 11   Glycopyrrolate (LONHALA MAGNAIR REFILL KIT) 25 MCG/ML SOLN Inhale 1 puff into the lungs 2 (two) times daily. 60 mL 5   levothyroxine (SYNTHROID, LEVOTHROID) 175 MCG tablet Take 1 tablet (175 mcg total) by mouth daily before breakfast. 90 tablet 0   metoprolol tartrate (LOPRESSOR) 25 MG tablet Take 0.5 tablets (12.5 mg total) by mouth 2 (two) times daily. 180 tablet 1    nitroGLYCERIN (NITROSTAT) 0.4 MG SL tablet Place 1 tablet (0.4 mg total) under the tongue every 5 (five) minutes as needed (up to 3 doses). For chest pain 25 tablet 3   promethazine-dextromethorphan (PROMETHAZINE-DM) 6.25-15 MG/5ML syrup Take 5 mLs by mouth 4 (four) times daily as needed for cough. 240 mL 0   Oxycodone HCl 10 MG TABS Take 1 tablet (10 mg total) by mouth 3 (three) times daily as needed. (Patient taking differently: Take 10 mg by mouth 3 (three) times daily as needed (pain). ) 90 tablet 0   No facility-administered medications prior to visit.     ROS Review of Systems  Constitutional: Negative for diaphoresis and fatigue.  HENT: Negative.   Eyes: Negative for visual disturbance.  Respiratory: Positive for cough and shortness of breath. Negative for chest tightness and wheezing.   Cardiovascular: Positive for chest pain. Negative for palpitations and leg swelling.  Gastrointestinal: Negative for abdominal pain, constipation, diarrhea, nausea and vomiting.  Endocrine: Negative for cold intolerance and heat intolerance.  Genitourinary: Negative.  Negative for difficulty urinating and dysuria.  Musculoskeletal: Positive for arthralgias, back pain and neck pain.  Skin: Negative.  Negative for color change and pallor.  Neurological: Negative.  Negative for dizziness, weakness and light-headedness.  Hematological: Negative for adenopathy. Does not bruise/bleed easily.  Psychiatric/Behavioral: Negative.  Objective:  BP 136/70 (BP Location: Left Arm, Patient Position: Sitting, Cuff Size: Normal)    Pulse 96    Temp (!) 97.4 F (36.3 C) (Oral)    Resp 20    Ht 5' 8"  (1.727 m)    Wt 155 lb 4 oz (70.4 kg)    SpO2 99%    BMI 23.61 kg/m   BP Readings from Last 3 Encounters:  09/06/18 136/70  08/27/18 97/70  08/24/18 108/77    Wt Readings from Last 3 Encounters:  09/06/18 155 lb 4 oz (70.4 kg)  08/27/18 154 lb 15.7 oz (70.3 kg)  07/21/18 169 lb (76.7 kg)    Physical  Exam Vitals signs reviewed.  Constitutional:      General: He is in acute distress.     Appearance: He is ill-appearing and diaphoretic.  HENT:     Nose: Nose normal. No congestion.     Mouth/Throat:     Mouth: Mucous membranes are moist.     Pharynx: Oropharynx is clear. No oropharyngeal exudate.  Eyes:     General: No scleral icterus.    Conjunctiva/sclera: Conjunctivae normal.  Neck:     Musculoskeletal: Normal range of motion and neck supple. No muscular tenderness.  Cardiovascular:     Rate and Rhythm: Normal rate and regular rhythm.     Heart sounds: No murmur. No gallop.   Pulmonary:     Effort: Tachypnea present.     Breath sounds: Examination of the left-lower field reveals decreased breath sounds. Decreased breath sounds present. No wheezing, rhonchi or rales.  Abdominal:     General: Bowel sounds are normal.     Palpations: There is no mass.     Tenderness: There is no abdominal tenderness. There is no guarding.  Musculoskeletal: Normal range of motion.        General: No swelling.     Right lower leg: No edema.     Left lower leg: No edema.  Lymphadenopathy:     Cervical: No cervical adenopathy.  Skin:    General: Skin is warm.     Coloration: Skin is not pale.  Neurological:     General: No focal deficit present.     Lab Results  Component Value Date   WBC 5.7 08/25/2018   HGB 13.0 08/25/2018   HCT 39.7 08/25/2018   PLT 263 08/25/2018   GLUCOSE 87 08/25/2018   CHOL 145 06/22/2018   TRIG 66.0 06/22/2018   HDL 72.20 06/22/2018   LDLDIRECT 148.4 10/18/2009   LDLCALC 59 06/22/2018   ALT 10 08/25/2018   AST 15 08/25/2018   NA 132 (L) 08/25/2018   K 4.6 08/25/2018   CL 96 (L) 08/25/2018   CREATININE 0.73 08/25/2018   BUN <5 (L) 08/25/2018   CO2 23 08/25/2018   TSH 11.93 (H) 06/22/2018   INR 1.05 01/15/2018   HGBA1C 4.2 (L) 04/16/2017    Dg Chest 1 View  Result Date: 08/25/2018 CLINICAL DATA:  Status post left-sided thoracentesis EXAM: CHEST  1  VIEW COMPARISON:  August 24, 2018 FINDINGS: No evident pneumothorax. Left pleural effusion is somewhat smaller following thoracentesis. There remains pleural fluid on the left with consolidation in the left base. There is a rather minimal right pleural effusion with mild right base atelectasis. Heart is mildly enlarged with pulmonary vascularity normal. No adenopathy. Surgical clips noted the left. IMPRESSION: No pneumothorax. Left pleural effusion smaller following thoracentesis. There does remain pleural fluid on the left with minimal pleural effusion  on the right. There is consolidation in the left base which may represent atelectasis or possible degree of pneumonia. Both atelectasis and pneumonia may be present concurrently. Stable cardiac prominence. Electronically Signed   By: Lowella Grip III M.D.   On: 08/25/2018 10:08   Dg Chest 2 View  Result Date: 08/24/2018 CLINICAL DATA:  63 year old with current history of metastatic LEFT UPPER LOBE lung cancer, presenting with chronic persistent shortness of breath for at least 2 months, associated with productive cough. EXAM: CHEST - 2 VIEW COMPARISON:  07/21/2018 and earlier, including CT chest 06/03/2018. FINDINGS: Since the examination 1 month ago, interval increase in size of the now moderate to large LEFT pleural effusion and development of consolidation in the LEFT LOWER LOBE. Cardiac silhouette moderately enlarged, unchanged. Pulmonary vascularity normal without evidence of pulmonary edema. Stable chronic pleuroparenchymal scarring at the RIGHT lung base. RIGHT lung otherwise clear. Metallic fiducial markers in the LEFT UPPER LOBE as previously noted. The previously identified LEFT LATERAL sixth rib fracture is not conspicuous. IMPRESSION: 1. Since the examination 1 month ago, increase in size of the now moderate to large LEFT pleural effusion and associated passive atelectasis and/or pneumonia involving the LEFT LOWER LOBE. 2. Stable cardiomegaly  without evidence of pulmonary edema. Electronically Signed   By: Evangeline Dakin M.D.   On: 08/24/2018 12:34   Dg Chest Port 1 View  Result Date: 08/24/2018 CLINICAL DATA:  Shortness of breath EXAM: PORTABLE CHEST 1 VIEW COMPARISON:  08/24/2018 FINDINGS: Moderate left pleural effusion with left lower lobe atelectasis or infiltrate, both slightly worsening since prior study. Small right effusion with right base atelectasis. Cardiomegaly. No acute bony abnormality. IMPRESSION: Moderate left pleural effusion and left lower lobe atelectasis or infiltrate, both worsening since prior study. Small right effusion with right base atelectasis. Cardiomegaly. Electronically Signed   By: Rolm Baptise M.D.   On: 08/24/2018 19:29   Ir Thoracentesis Asp Pleural Space W/img Guide  Result Date: 08/25/2018 INDICATION: Patient with history tongue cancer, lung cancer, COPD, dyspnea, and left pleural effusion. Request is made for diagnostic and therapeutic left thoracentesis. EXAM: ULTRASOUND GUIDED DIAGNOSTIC AND THERAPEUTIC LEFT THORACENTESIS MEDICATIONS: 10 mL 1% lidocaine COMPLICATIONS: None immediate. PROCEDURE: An ultrasound guided thoracentesis was thoroughly discussed with the patient and questions answered. The benefits, risks, alternatives and complications were also discussed. The patient understands and wishes to proceed with the procedure. Written consent was obtained. Ultrasound was performed to localize and mark an adequate pocket of fluid in the left chest. The area was then prepped and draped in the normal sterile fashion. 1% Lidocaine was used for local anesthesia. Under ultrasound guidance a 6 Fr Safe-T-Centesis catheter was introduced. Thoracentesis was performed. The catheter was removed and a dressing applied. FINDINGS: A total of approximately 1.7 L of dark red/blood-tinged fluid was removed. Procedure was stopped 1.7 L due to patient discomfort. Samples were sent to the laboratory as requested by the  clinical team. IMPRESSION: Successful ultrasound guided left thoracentesis yielding 1.7 L of pleural fluid. Read by: Earley Abide, PA-C Electronically Signed   By: Aletta Edouard M.D.   On: 08/25/2018 10:21   Dg Chest 2 View  Result Date: 09/06/2018 CLINICAL DATA:  Pleural effusion. EXAM: CHEST - 2 VIEW COMPARISON:  Radiograph of August 25, 2018. FINDINGS: Stable cardiomegaly. No pneumothorax is noted. Stable mild left pleural effusion is noted with associated atelectasis or infiltrate. Minimal right pleural effusion is noted. Bony thorax is unremarkable. IMPRESSION: Stable mild left pleural effusion is noted with  associated atelectasis or infiltrate. Minimal right pleural effusion is noted. Electronically Signed   By: Marijo Conception M.D.   On: 09/06/2018 13:49     Assessment & Plan:   Achilles was seen today for copd and cough.  Diagnoses and all orders for this visit:  Pleural effusion, left  Recurrent pleural effusion on left- Repeat chest x-ray today shows a stable pleural effusion.  He will see thoracic surgery to consider having a definitive procedure to treat this. -     DG Chest 2 View; Future -     Ambulatory referral to Cardiothoracic Surgery  Cancer-related pain- I will add morphine to the oxycodone to try to get better relief from his pain. -     morphine (MS CONTIN) 15 MG 12 hr tablet; Take 1 tablet (15 mg total) by mouth every 12 (twelve) hours. -     Oxycodone HCl 10 MG TABS; Take 1 tablet (10 mg total) by mouth 3 (three) times daily as needed (pain).  Chest wall pain, chronic -     morphine (MS CONTIN) 15 MG 12 hr tablet; Take 1 tablet (15 mg total) by mouth every 12 (twelve) hours. -     Oxycodone HCl 10 MG TABS; Take 1 tablet (10 mg total) by mouth 3 (three) times daily as needed (pain).  Acquired hypothyroidism- I will monitor his TSH and will adjust his dose if indicated. -     TSH; Future  Metastasis to bone (HCC) -     Oxycodone HCl 10 MG TABS; Take 1 tablet (10  mg total) by mouth 3 (three) times daily as needed (pain).   I have changed Hunt Oris. Dacosta's Oxycodone HCl. I am also having him start on morphine. Additionally, I am having him maintain his aspirin, nitroGLYCERIN, esomeprazole, B-complex with vitamin C, metoprolol tartrate, feeding supplement (ENSURE ENLIVE), dronabinol, levothyroxine, promethazine-dextromethorphan, cyclobenzaprine, Glycopyrrolate, and albuterol.  Meds ordered this encounter  Medications   morphine (MS CONTIN) 15 MG 12 hr tablet    Sig: Take 1 tablet (15 mg total) by mouth every 12 (twelve) hours.    Dispense:  60 tablet    Refill:  0   Oxycodone HCl 10 MG TABS    Sig: Take 1 tablet (10 mg total) by mouth 3 (three) times daily as needed (pain).    Dispense:  90 tablet    Refill:  0     Follow-up: Return in about 4 months (around 01/07/2019).  Scarlette Calico, MD

## 2018-09-07 NOTE — Progress Notes (Signed)
COMMUNITY PALLIATIVE CARE RN NOTE  PATIENT NAME: Trevor Griffin DOB: 1955-11-13 MRN: [id number]  PRIMARY CARE PROVIDER: Janith Lima, MD  RESPONSIBLE PARTY:  Acct ID - Guarantor Home Phone Work Phone Relationship Acct Type  [id number] [name]  Self P/F     [place], [place],  [address]   Due to the COVID-19 crisis, this virtual check-in visit was done via telephone from my office and it was initiated and consent by this patient and or family.  PLAN OF CARE and INTERVENTION:  1. ADVANCE CARE PLANNING/GOALS OF CARE: Goal is for patient to remain at home with his wife. 2. PATIENT/CAREGIVER EDUCATION: Explained Palliative Care Services and Pain Management 3. DISEASE STATUS: Joint virtual check in telephone visit completed with Palliative Care SW, Lynn Duffy. Spoke with patient and his wife, Trevor Griffin. Wife able to provide health history. Patient has been experiencing increased pain in area of L rib from his metastatic lung CA. He received Radiation, however states this did not help. He sleeps in his recliner as he is unable to lie flat d/t severe L rib pain. He is currently taking Oxycodone 10 mg 3x/day but patient says relief only lasts for about 1-2 hours at most. On a bad day, he only wants to lie in his recliner on a heating pad. He is supposed to have a nerve block scheduled next month to help with pain. He also has a recurrent L pleural effusion. Recent thoracentesis done. He is now on Oxygen and states that he is breathing better. Denies wheezing, coughing or gagging. He is independent with all ADLs, but tires easily. He says he does not have an appetite, and makes himself eat. He does have some dysphagia with thin liquids and solid foods. He had a feeding tube in the past before he had surgery on his tongue d/t tongue cancer. He Is continent of both bowel and bladder. Wife and patient agreeable to future visit from Palliative Care. Will continue to monitor.  HISTORY OF  PRESENT ILLNESS:  This is a 63 yo male who resides at home with his wife. He was recently hospitalized from 08/24/18-08/27/18 for increased dyspnea and found to have a recurrent L pleural effusion. Palliative Care Team asked to follow patient for close monitoring. Will check in with patient monthly and PRN.  CODE STATUS: Full Code ADVANCED DIRECTIVES: N MOST FORM: no PPS: 50%   (Duration of visit and documentation 45 minutes)   Daryl Eastern, RN BSN

## 2018-09-09 IMAGING — CT CT EXTREM LOW W/ CM*L*
2 of 4 series · 8 of 33 positions shown, 10 images · IV contrast (agent unspecified)
Comparison: Left lower extremity DVT ultrasound dated January 21, 2017.

CONTRAST:  100mL [id number] IOPAMIDOL (B4QT22-2XX) INJECTION 61%

CLINICAL DATA: Left lower leg swelling and redness. Superficial
thrombophlebitis diagnosed on prior left lower extremity ultrasound
in January 2017.

EXAM:
CT OF THE LOWER LEFT EXTREMITY WITH CONTRAST
TECHNIQUE: Multidetector CT imaging of the lower left extremity was performed
according to the standard protocol following intravenous contrast
administration.

[Series 3: lower ext (id) bone · axial · 0.50mm/px · z∈[+246,+706]mm · 5 of 460 slices shown, 7 images]
[im 77/460  soft-tissue]
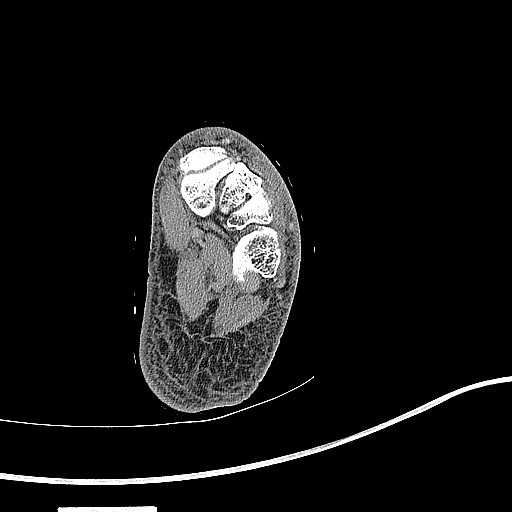
[im 77/460  bone]
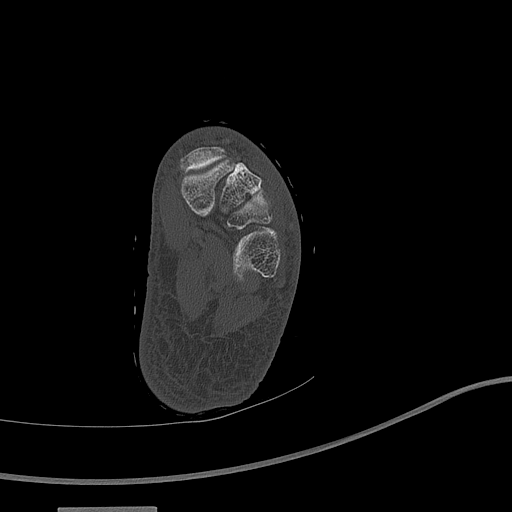
[im 154/460  bone]
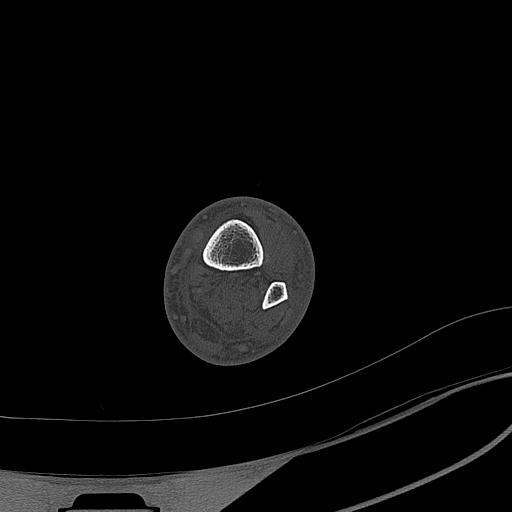
[im 230/460  bone]
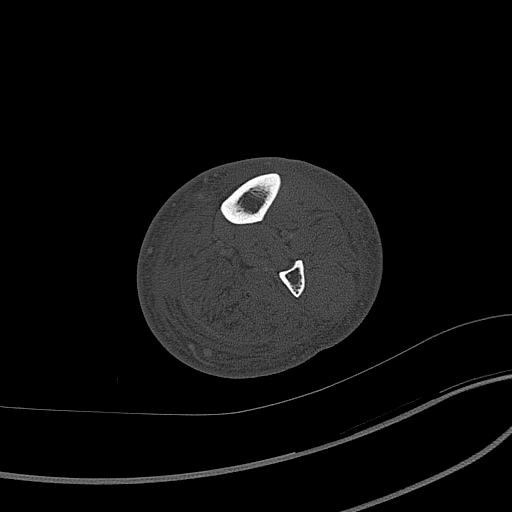
[im 307/460  bone]
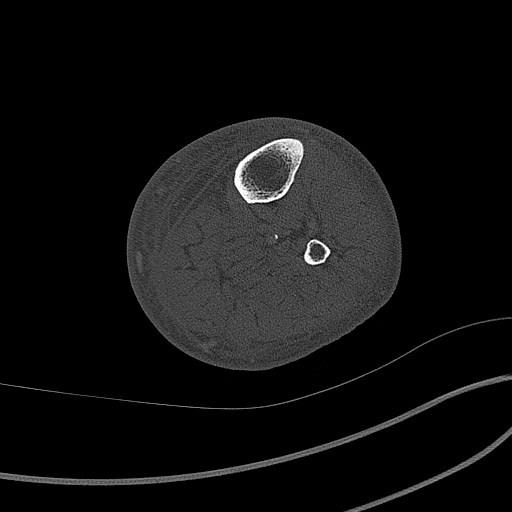
[im 383/460  soft-tissue]
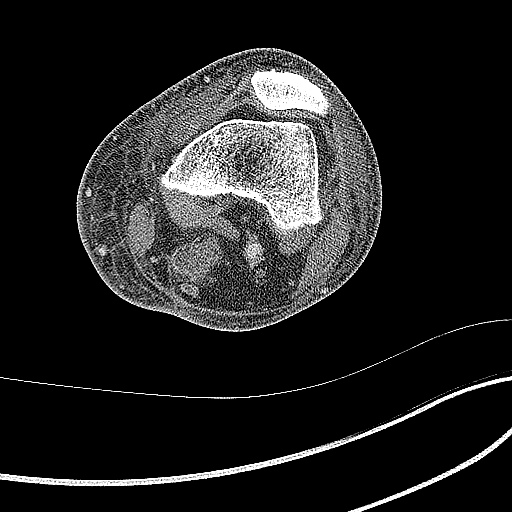
[im 383/460  bone]
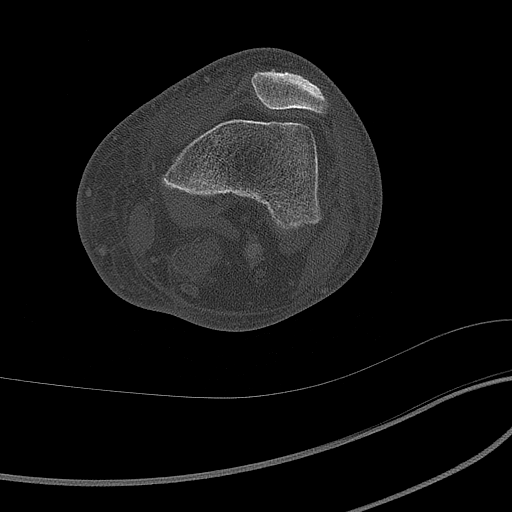

[Series 9: lower ext cor st · coronal · 0.35mm/px · 3 of 132 slices shown]
[im 27/132  bone]
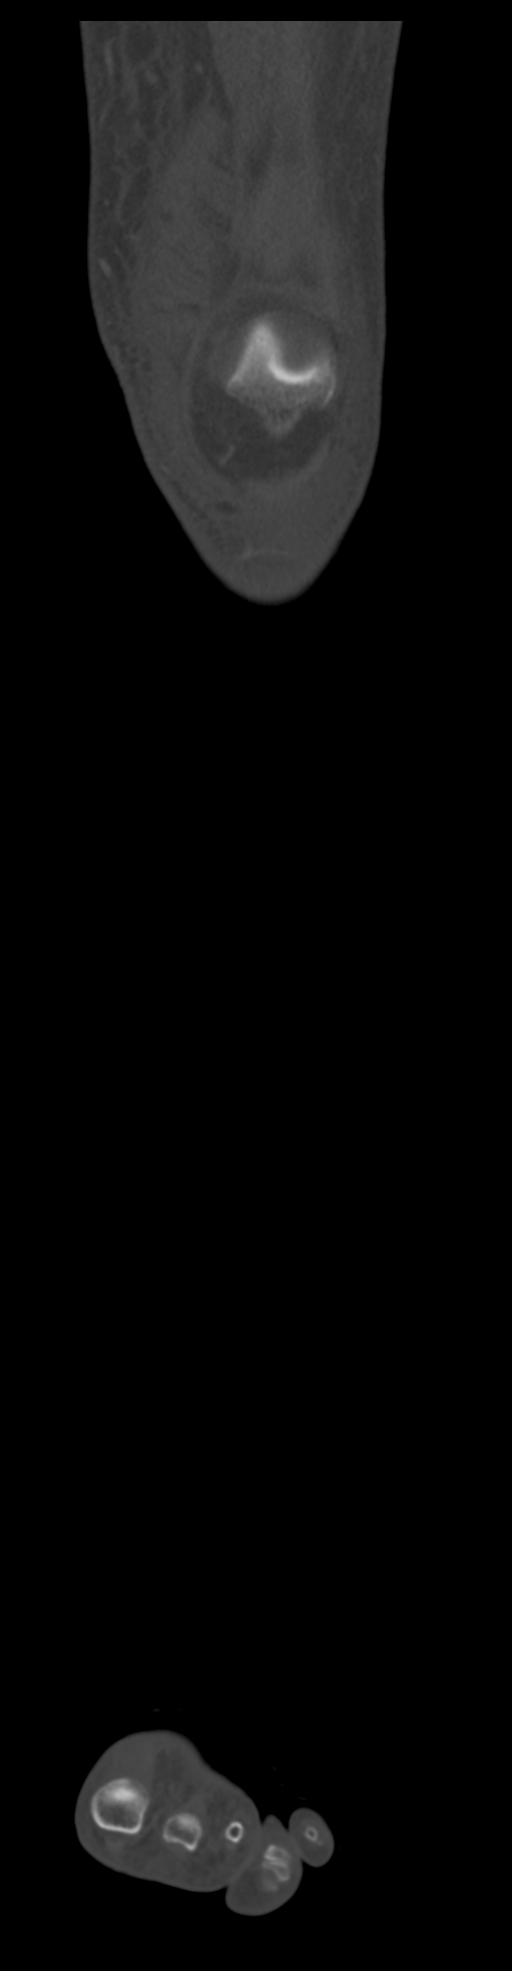
[im 53/132  bone]
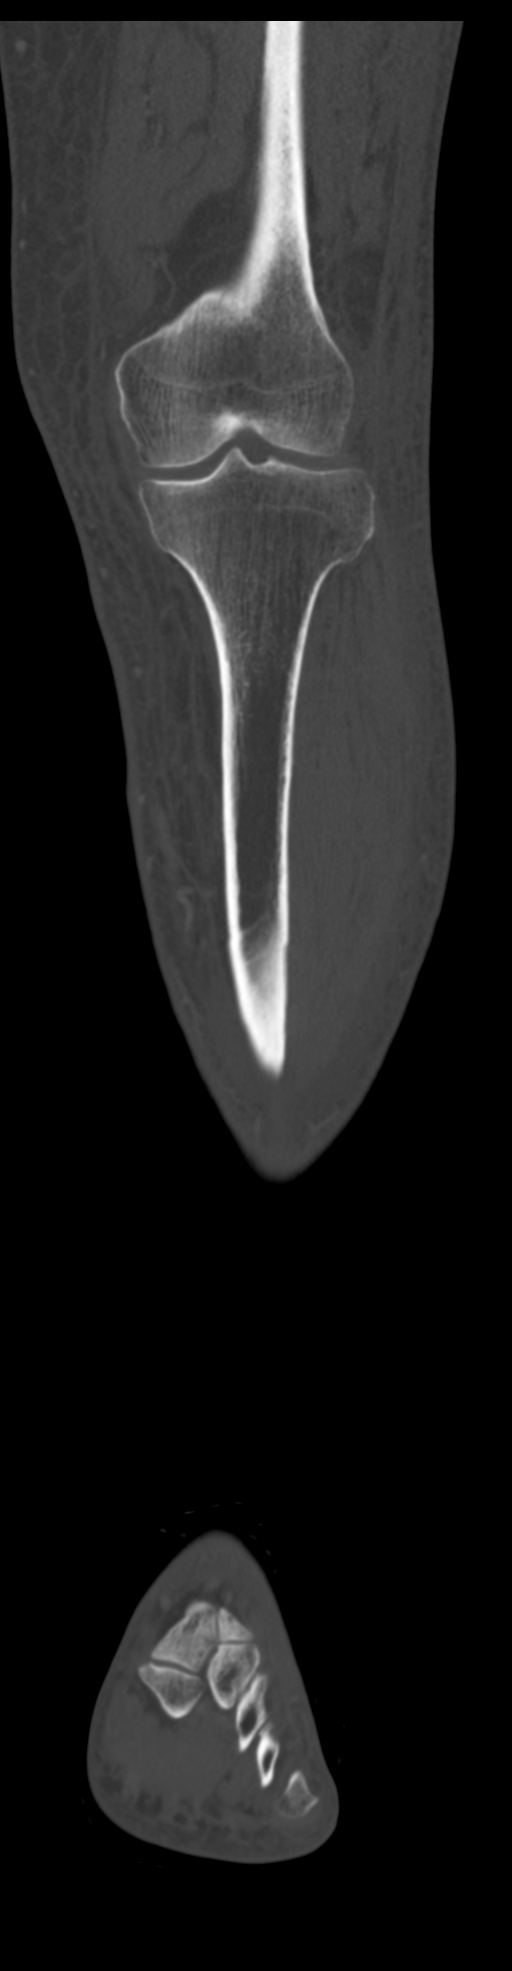
[im 79/132  bone]
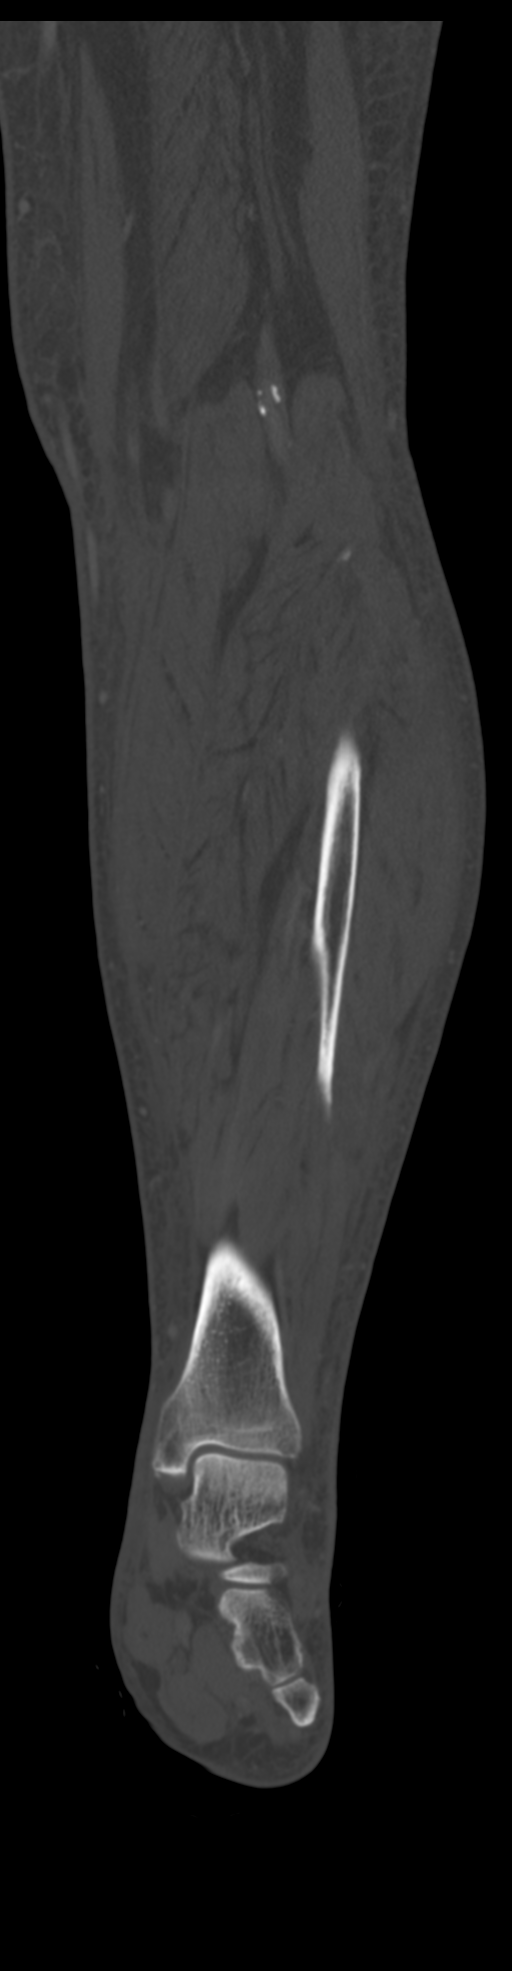

[8 of 33 positions shown; findings below may reference images not displayed]

FINDINGS: Bones/Joint/Cartilage

No acute fracture or malalignment. Joint spaces are preserved. No
knee or ankle joint effusion.

Ligaments

Suboptimally assessed by CT.

Muscles and Tendons

No focal abnormality.  Visualized tendons are intact.

Soft tissues

Diffuse, circumferential soft tissue swelling about the lower leg.
No drainable fluid collection. Atherosclerotic vascular
calcifications.

The great saphenous vein appears occluded at the level of the ankle.
Nonocclusive thrombus within the great saphenous vein below the
knee. The great saphenous vein above the knee appears patent.
IMPRESSION: 1. Diffuse, circumferential soft tissue swelling about the lower
leg, nonspecific, but can be seen with cellulitis or superficial
thrombophlebitis. No drainable fluid collection.
2. The great saphenous vein is patent above the knee, but contains
nonocclusive thrombus below the knee, and appears occluded at the
level of the ankle.

## 2018-09-13 ENCOUNTER — Other Ambulatory Visit: Payer: Self-pay | Admitting: Thoracic Surgery (Cardiothoracic Vascular Surgery)

## 2018-09-13 DIAGNOSIS — J90 Pleural effusion, not elsewhere classified: Secondary | ICD-10-CM

## 2018-09-14 ENCOUNTER — Ambulatory Visit: Payer: Medicare Other | Admitting: Thoracic Surgery (Cardiothoracic Vascular Surgery)

## 2018-09-20 ENCOUNTER — Ambulatory Visit: Payer: Medicare Other | Admitting: Internal Medicine

## 2018-09-21 ENCOUNTER — Other Ambulatory Visit: Payer: Self-pay | Admitting: *Deleted

## 2018-09-21 ENCOUNTER — Ambulatory Visit (INDEPENDENT_AMBULATORY_CARE_PROVIDER_SITE_OTHER): Payer: Medicare Other | Admitting: Thoracic Surgery (Cardiothoracic Vascular Surgery)

## 2018-09-21 ENCOUNTER — Encounter: Payer: Self-pay | Admitting: Thoracic Surgery (Cardiothoracic Vascular Surgery)

## 2018-09-21 ENCOUNTER — Ambulatory Visit
Admission: RE | Admit: 2018-09-21 | Discharge: 2018-09-21 | Disposition: A | Discharge status: Home / Self Care | Payer: Medicare Other | Source: Ambulatory Visit | Attending: Thoracic Surgery (Cardiothoracic Vascular Surgery) | Admitting: Thoracic Surgery (Cardiothoracic Vascular Surgery)

## 2018-09-21 ENCOUNTER — Other Ambulatory Visit: Payer: Self-pay

## 2018-09-21 VITALS — BP 116/72 | HR 85 | Temp 97.7°F | Resp 16 | Ht 68.0 in | Wt 155.0 lb

## 2018-09-21 DIAGNOSIS — Z85118 Personal history of other malignant neoplasm of bronchus and lung: Secondary | ICD-10-CM

## 2018-09-21 DIAGNOSIS — J90 Pleural effusion, not elsewhere classified: Secondary | ICD-10-CM

## 2018-09-21 DIAGNOSIS — I2510 Atherosclerotic heart disease of native coronary artery without angina pectoris: Secondary | ICD-10-CM | POA: Diagnosis not present

## 2018-09-21 DIAGNOSIS — Z9861 Coronary angioplasty status: Secondary | ICD-10-CM

## 2018-09-21 NOTE — Progress Notes (Signed)
[address]       [place] [address]             [phone]     HPI: Trevor Griffin is sent for consultation regarding a malignant left pleural effusion.   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  I did a navigational bronchoscopy and fiducial placement in September 2019.  He was then treated with stereotactic radiation.  In January had a PET CT which showed a metastasis to his left sixth rib.  He did have pain associated with that.  That was treated with radiation but he continues to have pain in that area.  In April he was admitted with shortness of breath.  He had a left pleural effusion.  Thoracentesis drained 1.7 L of fluid.  Cytology was positive for malignancy.  Postthoracentesis chest x-ray showed a small residual pleural effusion.  He had a follow-up chest x-ray in early May and the effusion was possibly minimally enlarged.  He did feel like he was starting to get more short of breath.  He was referred here for further management.  His appetite is poor.  He has had significant weight loss.  He has ongoing left-sided rib pain.  He says his breathing is been relatively stable over the past couple of weeks it has not worsened significantly.  Past Medical History:  Diagnosis Date   Acute ST elevation myocardial infarction (STEMI) involving left anterior descending (LAD) coronary artery ([facility]) 2008 and 2009   5/'08 - DES x 4 to p-m LAD; 2/'09 - PTCA of  in-stent thrombosis   Alcohol abuse    Anxiety    CAD S/P percutaneous coronary angioplasty 2008   a. Ant STEMI 5/08 w/ 4 Taxus DES p-m LAD (3.0 x 16,16,24,28 mm) ;  b. 2/'09:  Stopped Plavix-> Ant STEMI/stent thrombosis in LAD (PTCA - 3.8 mm), LCX 58md;  c.  10/12: Cath/PCI: p-m LAD stents 10-20% ISR, mLAD 40%, CFX 30-40%, mCFX 90% (Promus Premier DES 3.0 mm x 20 mm - 3.219m), EF 35%; d. cath 2/17/'16 no Sig CAD, patent LAD & Cx stents, EF 40-45%   Chronic pain    COPD (chronic obstructive pulmonary disease) (HCC)    Depression    Fatigue    Sleep study 6/08 was negative. Sleep study 5/13 showed no OSA.   GERD (gastroesophageal reflux disease)    Headache(784.0)    migraines   History of radiation therapy 05/27/2018- 06/02/2018   Left 6th rib tumor, 5 Gy X 5 fractions for a total dose of 25 Gy.    Hx of radiation therapy 10/25/12-12/10/12    Right Tongue/ Bilateral Neck //Total Dose, 60 Gy in 30 Fractions   Hyperlipidemia    Hypertension    Ischemic cardiomyopathy    a. Echo 08/20/11 with EF 50-55% (previously 35%);  b. 02/2014 Echo: EF 45-50-%, mid-apicalanteroseptal AK, Gr 1 dd, mild AI, midlly dil LA.   Lung cancer ([place] 2015   Obesity    Pleural effusion 08/2018   recurrent    Tobacco abuse    [place]   s/p resection - [facility]   Umbilical hernia     Current Outpatient Medications  Medication Sig Dispense Refill   albuterol (PROVENTIL) (2.5 MG/3ML)  0.083% nebulizer solution Take 3 mLs (2.5 mg total) by nebulization every 6 (six) hours as needed for wheezing or shortness of breath. 75 mL 12   aspirin 81 MG tablet Take 1 tablet (81 mg total) by mouth daily. 30 tablet 1   B Complex-C (B-COMPLEX WITH VITAMIN C) tablet Take 1 tablet daily by mouth. 30 tablet 0   cyclobenzaprine (FLEXERIL) 5 MG tablet Take 1 tablet (5 mg total) by mouth 2 (two) times daily as needed for muscle spasms. 60 tablet 3   dronabinol (MARINOL) 2.5 MG capsule Take 1 capsule (2.5 mg total) by mouth 2 (two) times daily before lunch and supper. 60 capsule 0   esomeprazole (NEXIUM) 20 MG capsule Take 20 mg by mouth daily.      feeding supplement, ENSURE ENLIVE, (ENSURE ENLIVE) LIQD Take 237 mLs by mouth 4 (four) times  daily. 120 Bottle 11   Glycopyrrolate (LONHALA MAGNAIR REFILL KIT) 25 MCG/ML SOLN Inhale 1 puff into the lungs 2 (two) times daily. 60 mL 5   levothyroxine (SYNTHROID, LEVOTHROID) 175 MCG tablet Take 1 tablet (175 mcg total) by mouth daily before breakfast. 90 tablet 0   metoprolol tartrate (LOPRESSOR) 25 MG tablet Take 0.5 tablets (12.5 mg total) by mouth 2 (two) times daily. 180 tablet 1   morphine (MS CONTIN) 15 MG 12 hr tablet Take 1 tablet (15 mg total) by mouth every 12 (twelve) hours. 60 tablet 0   nitroGLYCERIN (NITROSTAT) 0.4 MG SL tablet Place 1 tablet (0.4 mg total) under the tongue every 5 (five) minutes as needed (up to 3 doses). For chest pain 25 tablet 3   Oxycodone HCl 10 MG TABS Take 1 tablet (10 mg total) by mouth 3 (three) times daily as needed (pain). 90 tablet 0   promethazine-dextromethorphan (PROMETHAZINE-DM) 6.25-15 MG/5ML syrup Take 5 mLs by mouth 4 (four) times daily as needed for cough. 240 mL 0   No current facility-administered medications for this visit.     Physical Exam BP 116/72 (BP Location: Right Arm, Patient Position: Sitting, Cuff Size: Normal)    Pulse 85    Temp 97.7 F (36.5 C) (Skin)    Resp 16    Ht 5' 8"  (1.727 m)    Wt 155 lb (70.3 kg)    SpO2 90% Comment: ON RA...USUALLY USES 02 AT 2L   BMI 23.63 kg/m  63 year old man in no acute distress, chronically ill-appearing Wearing surgical mask Cardiac regular rate and rhythm normal S1 and S2 Lungs diminished at left base No peripheral edema  Diagnostic Tests: CHEST - 2 VIEW  COMPARISON:  09/06/2018, 08/25/2018  FINDINGS: Cardiomediastinal silhouette unchanged in size and contour. Left heart border partially obscured by overlying lung and pleural disease.  Opacity at the left lung base obscuring the heart border, blunting the left costophrenic angle and partially obscuring left hemidiaphragm. Meniscus on the lateral view. No evidence of interlobular septal thickening or fullness in the  central vasculature. Surgical clips of the left chest.  No pneumothorax.  No new confluent airspace disease.  IMPRESSION: Small left pleural effusion again demonstrated with associated atelectasis/consolidation. Volume appears essentially unchanged from 09/06/2018.  Chronic lung changes.   Electronically Signed   By: Corrie Mckusick D.O.   On: 09/21/2018 15:43 I personally reviewed the CT images and concur with the findings noted above  Impression: Trevor Griffin is a 63 year old gentleman with a history of tobacco abuse and multiple related problems.  He had cancer of the tongue treated with resection followed  by radiation.  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  Unfortunately he then developed a rib metastasis and a malignant pleural effusion.  He had a thoracentesis in April which drained 1.7 L of fluid.  Cytology was positive.  His postthoracentesis x-ray showed a residual effusion.  In the note, the thoracentesis was stopped due to discomfort, so his effusion was never completely drained.  Over the course of the past month his effusion has increased minimally.   Most of these pleural effusions do recur and end up requiring a pleural catheter, but currently the effusion is relatively small and it may be difficult to place a catheter.  I have seen several of these where the catheter was placed but turned out to be unnecessary.  I recommended to him that we do a repeat thoracentesis to see if we can get the remainder of the fluid drained.  We would then plan to do another chest x-ray in about a month.  If there is significant reaccumulation then we could consider placement of a pleural catheter.  Plan:  Repeat thoracentesis Follow-up in 1 month with PA and lateral chest x-ray  Trevor Nakayama, MD [facility] [phone]

## 2018-09-22 ENCOUNTER — Other Ambulatory Visit (HOSPITAL_COMMUNITY)
Admission: RE | Admit: 2018-09-22 | Discharge: 2018-09-22 | Disposition: A | Discharge status: Home / Self Care | Payer: Medicare Other | Source: Ambulatory Visit | Attending: Thoracic Surgery (Cardiothoracic Vascular Surgery) | Admitting: Thoracic Surgery (Cardiothoracic Vascular Surgery)

## 2018-09-22 DIAGNOSIS — Z1159 Encounter for screening for other viral diseases: Secondary | ICD-10-CM | POA: Diagnosis not present

## 2018-09-23 ENCOUNTER — Other Ambulatory Visit: Payer: Self-pay

## 2018-09-23 ENCOUNTER — Other Ambulatory Visit: Payer: Medicare Other | Admitting: *Deleted

## 2018-09-23 DIAGNOSIS — Z515 Encounter for palliative care: Secondary | ICD-10-CM

## 2018-09-23 LAB — NOVEL CORONAVIRUS, NAA (HOSP ORDER, SEND-OUT TO REF LAB; TAT 18-24 HRS): SARS-CoV-2, NAA: NOT DETECTED

## 2018-09-24 ENCOUNTER — Telehealth: Payer: Self-pay | Admitting: Licensed Clinical Social Worker

## 2018-09-24 NOTE — Telephone Encounter (Signed)
Palliative Care SW phoned patient's wife, [name], per the request of Palliative Care RN, Daryl Eastern, for emotional support.  She stated it "was not a good time" and asked for SW to call her back on Tuesday.

## 2018-09-28 ENCOUNTER — Ambulatory Visit (HOSPITAL_COMMUNITY): Admission: RE | Admit: 2018-09-28 | Payer: Medicare Other | Source: Ambulatory Visit

## 2018-09-28 ENCOUNTER — Telehealth: Payer: Self-pay | Admitting: Licensed Clinical Social Worker

## 2018-09-28 NOTE — Telephone Encounter (Signed)
Palliative Care SW spoke with patient's wife, [name], to provide supportive counseling.  She stated her sister died 07/26/2022 and that she could talk at the moment.  SW to follow up at a later date.

## 2018-09-29 ENCOUNTER — Telehealth: Payer: Self-pay | Admitting: *Deleted

## 2018-09-29 ENCOUNTER — Inpatient Hospital Stay (HOSPITAL_COMMUNITY): Admission: RE | Admit: 2018-09-29 | Payer: Medicare Other | Source: Ambulatory Visit

## 2018-09-29 ENCOUNTER — Telehealth: Payer: Self-pay

## 2018-09-29 NOTE — Telephone Encounter (Signed)
CALLED DR. HARKINS' OFFICE TO RESCHEDULE APPT. FOR TODAY, DUE TO MISSING IT BECAUSE OF DEATH IN FAMILY, LVM FOR DOMINQUE TO CALL ME SO THAT IT CAN BE RESCHEDULED

## 2018-09-29 NOTE — Telephone Encounter (Signed)
I received a voice mail from Ms. Trevor Griffin today. She told me that she is unable to bring Trevor Griffin to his appointment with Dr. Maryjean Griffin today because her sister has passed away. When I spoke to her at 11:45 and investigated further, her appointment with Dr. Maryjean Griffin was at 10:15 today. I have asked the medical secretary for Dr. Isidore Griffin to call Dr. Maryjean Griffin office to attempt to reschedule the missed appointment today. Dr. Isidore Griffin originally wrote the referral, but I informed Ms. Trevor Griffin that she should call Dr. Maryjean Griffin office with cancellations, if needed.

## 2018-09-30 ENCOUNTER — Other Ambulatory Visit (HOSPITAL_COMMUNITY)
Admission: RE | Admit: 2018-09-30 | Discharge: 2018-09-30 | Disposition: A | Discharge status: Home / Self Care | Payer: Medicare Other | Source: Ambulatory Visit | Attending: Thoracic Surgery (Cardiothoracic Vascular Surgery) | Admitting: Thoracic Surgery (Cardiothoracic Vascular Surgery)

## 2018-09-30 ENCOUNTER — Telehealth: Payer: Self-pay | Admitting: *Deleted

## 2018-09-30 DIAGNOSIS — Z1159 Encounter for screening for other viral diseases: Secondary | ICD-10-CM | POA: Diagnosis not present

## 2018-09-30 NOTE — Telephone Encounter (Signed)
CALLED PATIENT TO INFORM OF APPT. WITH DR. HARKINS HAS BEEN RESCHEDULED TO 10-19-18 - 2:30 PM, PATIENT TO BRING PAPER WORK WITH THEM THAT DR. HARKINS' OFFICE MAILED TO THEM, SPOKE WITH PATIENT AND HE IS AWARE OF THIS APPT.

## 2018-10-01 LAB — NOVEL CORONAVIRUS, NAA (HOSP ORDER, SEND-OUT TO REF LAB; TAT 18-24 HRS): SARS-CoV-2, NAA: NOT DETECTED

## 2018-10-04 ENCOUNTER — Other Ambulatory Visit: Payer: Self-pay

## 2018-10-04 ENCOUNTER — Ambulatory Visit (HOSPITAL_COMMUNITY)
Admission: RE | Admit: 2018-10-04 | Discharge: 2018-10-04 | Disposition: A | Discharge status: Home / Self Care | Payer: Medicare Other | Source: Ambulatory Visit | Attending: Thoracic Surgery (Cardiothoracic Vascular Surgery) | Admitting: Thoracic Surgery (Cardiothoracic Vascular Surgery)

## 2018-10-04 ENCOUNTER — Encounter (HOSPITAL_COMMUNITY): Payer: Self-pay | Admitting: Student

## 2018-10-04 ENCOUNTER — Other Ambulatory Visit: Payer: Self-pay | Admitting: Thoracic Surgery (Cardiothoracic Vascular Surgery)

## 2018-10-04 DIAGNOSIS — J90 Pleural effusion, not elsewhere classified: Secondary | ICD-10-CM

## 2018-10-04 DIAGNOSIS — J95811 Postprocedural pneumothorax: Secondary | ICD-10-CM | POA: Insufficient documentation

## 2018-10-04 HISTORY — PX: IR THORACENTESIS ASP PLEURAL SPACE W/IMG GUIDE: IMG5380

## 2018-10-04 MED ORDER — LIDOCAINE HCL 1 % IJ SOLN
INTRAMUSCULAR | Status: DC | PRN
  Administered 2018-10-04: 10 mL

## 2018-10-04 MED ORDER — LIDOCAINE HCL 1 % IJ SOLN
INTRAMUSCULAR | Status: AC
  Filled 2018-10-04: qty 20

## 2018-10-04 NOTE — Procedures (Signed)
PROCEDURE SUMMARY:  Successful image-guided left thoracentesis. Yielded 300 milliliters of dark red fluid. Patient tolerated procedure well. No immediate complications. EBL = 5 mL.  Specimen was not sent for labs. CXR ordered.  Cammi Consalvo PA-C 10/04/2018 11:00 AM

## 2018-10-04 NOTE — Progress Notes (Signed)
COMMUNITY PALLIATIVE CARE RN NOTE  PATIENT NAME: AMORI COLOMB DOB: 05/17/1955 MRN: [id number]  PRIMARY CARE PROVIDER: Janith Lima, MD  RESPONSIBLE PARTY: Talmadge Coventry (wife) Acct ID - Guarantor Home Phone Work Phone Relationship Acct Type  [id number] [name]  Self P/F     [place], [place], [place] [address]   Due to the COVID-19 crisis, this virtual check-in visit was done via telephone from my office and it was initiated and consent by this patient and or family  PLAN OF CARE and INTERVENTION:  1. ADVANCE CARE PLANNING/GOALS OF CARE: Goal is for patient to remain at home with his wife. 2. PATIENT/CAREGIVER EDUCATION: Pain Management and Disease Processes 3. DISEASE STATUS: Virtual check in visit completed today. Patient's wife [name] provided most of information regarding patient's current condition. Wife reports that patient sleeps "all the time" and she is having difficulties getting him to eat. He is able to be aroused with verbal and tactile stimulation. He continues with pain in his R rib region and is taking pain medications regularly. He remains ambulatory and able to provide his own personal care, however wife states he is moving much slower and tires very easily. Requires frequent rest periods between activities. He has still been able to get to his doctor appointments. Wife drives him there. His appetite is poor. He is currently eating a few bites of an ice cream sandwich. She states that he has lost "so much weight" and his clothes are fitting much looser. She states he is supposed to have another thoracentesis done. He has been tested for Covid-19 and awaiting results. Wife very tearful throughout entire visit stating that "I am scared that I am going to lose my husband soon. I see so many changes." She is very familiar with hospice services but wants patient to stay under Palliative Care for now. She is agreeable in having our Palliative Care SW contact her for  additional emotional support and counseling. Will continue to monitor.    HISTORY OF PRESENT ILLNESS:  This is a 63 yo male who resides at home with his wife. Palliative Care Team continues to follow patient. Will continue to check in with patient monthly and PRN.  CODE STATUS: Full Code ADVANCED DIRECTIVES: N MOST FORM: no PPS: 50%   (Duration of visit and documentation 45 minutes)   Daryl Eastern, RN BSN

## 2018-10-05 ENCOUNTER — Ambulatory Visit: Payer: Medicare Other | Admitting: Thoracic Surgery (Cardiothoracic Vascular Surgery)

## 2018-10-06 ENCOUNTER — Other Ambulatory Visit: Payer: Medicare Other | Admitting: Licensed Clinical Social Worker

## 2018-10-06 ENCOUNTER — Other Ambulatory Visit: Payer: Self-pay

## 2018-10-06 DIAGNOSIS — Z515 Encounter for palliative care: Secondary | ICD-10-CM

## 2018-10-06 NOTE — Progress Notes (Signed)
COMMUNITY PALLIATIVE CARE SW NOTE  PATIENT NAME: Trevor Griffin DOB: 1955/12/25 MRN: [id number]  PRIMARY CARE PROVIDER: Janith Lima, MD  RESPONSIBLE PARTY:  Acct ID - Guarantor Home Phone Work Phone Relationship Acct Type  [id number] [name]  Self P/F     [place], [place], [place] [address]   Due to the COVID-19 crisis, this virtual check-in visit was done via telephone from my office and it was initiated and consent given by this patient and or family.  PLAN OF CARE and INTERVENTIONS:             1. GOALS OF CARE/ ADVANCE CARE PLANNING:  Patient's goal is to remain at home with his wife, Trevor Griffin.  He is a full code. 2. SOCIAL/EMOTIONAL/SPIRITUAL ASSESSMENT/ INTERVENTIONS:  SW conducted a Sales executive visit with patient's wife, Trevor Griffin, in their home.  She stated since patient had "fluid taken off" he has acted like he has "dementia".  She also reported having leg pain and not being able to walk.  Trevor Griffin has reached out to her PCP for assistance.  SW provided active listening and supportive counseling while she discussed her loss.  Her parents, three brothers, and sister have all died.  She feels very alone and helpless.  She has several pets in which she derives comfort from.  She has discussed Hospice with her husband, which he does not wish for at this time.  SW informed Palliative Care RN of visit. 3. PATIENT/CAREGIVER EDUCATION/ COPING:  Trevor Griffin expresses her feelings openly. 4. PERSONAL EMERGENCY PLAN:  Trevor Griffin will contact EMS. 5. COMMUNITY RESOURCES COORDINATION/ HEALTH CARE NAVIGATION:  None. 6. FINANCIAL/LEGAL CONCERNS/INTERVENTIONS:  None.     SOCIAL HX:  Social History   Tobacco Use  . Smoking status: Former Smoker    Years: 46.00    Types: Cigars  . Smokeless tobacco: Never Used  . Tobacco comment: He has quit smoking recently, he is using a patch  Substance Use Topics  . Alcohol use: Yes    Alcohol/week: 3.0 standard drinks    Types: 3 Cans of beer per  week    Comment: 3 beers 4 days a week    CODE STATUS:  Full Code.  ADVANCED DIRECTIVES: N MOST FORM COMPLETE:  N HOSPICE EDUCATION PROVIDED: Family is familiar with Hospice. PPS:  Patient's appetite has decreased.  He can ambulate independently. Duration of visit and documentation:  45 minutes.      [name], LCSW

## 2018-10-07 ENCOUNTER — Telehealth: Payer: Self-pay | Admitting: Internal Medicine

## 2018-10-07 DIAGNOSIS — I2510 Atherosclerotic heart disease of native coronary artery without angina pectoris: Secondary | ICD-10-CM | POA: Diagnosis not present

## 2018-10-07 DIAGNOSIS — F17211 Nicotine dependence, cigarettes, in remission: Secondary | ICD-10-CM | POA: Diagnosis not present

## 2018-10-07 DIAGNOSIS — J910 Malignant pleural effusion: Secondary | ICD-10-CM | POA: Diagnosis not present

## 2018-10-07 DIAGNOSIS — J449 Chronic obstructive pulmonary disease, unspecified: Secondary | ICD-10-CM | POA: Diagnosis not present

## 2018-10-07 DIAGNOSIS — R634 Abnormal weight loss: Secondary | ICD-10-CM | POA: Diagnosis not present

## 2018-10-07 DIAGNOSIS — Z741 Need for assistance with personal care: Secondary | ICD-10-CM | POA: Diagnosis not present

## 2018-10-07 DIAGNOSIS — C7951 Secondary malignant neoplasm of bone: Secondary | ICD-10-CM | POA: Diagnosis not present

## 2018-10-07 DIAGNOSIS — G893 Neoplasm related pain (acute) (chronic): Secondary | ICD-10-CM

## 2018-10-07 DIAGNOSIS — C3490 Malignant neoplasm of unspecified part of unspecified bronchus or lung: Secondary | ICD-10-CM | POA: Diagnosis not present

## 2018-10-07 DIAGNOSIS — Z6823 Body mass index (BMI) 23.0-23.9, adult: Secondary | ICD-10-CM | POA: Diagnosis not present

## 2018-10-07 DIAGNOSIS — I10 Essential (primary) hypertension: Secondary | ICD-10-CM | POA: Diagnosis not present

## 2018-10-07 DIAGNOSIS — Z9981 Dependence on supplemental oxygen: Secondary | ICD-10-CM | POA: Diagnosis not present

## 2018-10-07 NOTE — Telephone Encounter (Signed)
Copied from [facility] [phone]. Topic: Quick Communication - See Telephone Encounter >> Oct 07, 2018  8:47 AM Reyne Dumas L wrote: CRM for notification. See Telephone encounter for: 10/07/18.  Pt's wife calling:  she would like to speak with Dr. Ronnald Ramp - states that pt is loosing weight and has dementia.   [name], wife, can be reached at [phone].

## 2018-10-07 NOTE — Telephone Encounter (Signed)
Called pt spouse - stated that pt is fatigue, memory issues and not eating at all. She is distressed and not able to physically take care of him right now.   Spouse stated that she is interested in Hospice. Hospice referral entered and Hospice has been contacted.

## 2018-10-08 DIAGNOSIS — C3490 Malignant neoplasm of unspecified part of unspecified bronchus or lung: Secondary | ICD-10-CM | POA: Diagnosis not present

## 2018-10-08 DIAGNOSIS — J910 Malignant pleural effusion: Secondary | ICD-10-CM | POA: Diagnosis not present

## 2018-10-08 DIAGNOSIS — I10 Essential (primary) hypertension: Secondary | ICD-10-CM | POA: Diagnosis not present

## 2018-10-08 DIAGNOSIS — I2510 Atherosclerotic heart disease of native coronary artery without angina pectoris: Secondary | ICD-10-CM | POA: Diagnosis not present

## 2018-10-08 DIAGNOSIS — J449 Chronic obstructive pulmonary disease, unspecified: Secondary | ICD-10-CM | POA: Diagnosis not present

## 2018-10-08 DIAGNOSIS — C7951 Secondary malignant neoplasm of bone: Secondary | ICD-10-CM | POA: Diagnosis not present

## 2018-10-09 DIAGNOSIS — C7951 Secondary malignant neoplasm of bone: Secondary | ICD-10-CM | POA: Diagnosis not present

## 2018-10-09 DIAGNOSIS — C3490 Malignant neoplasm of unspecified part of unspecified bronchus or lung: Secondary | ICD-10-CM | POA: Diagnosis not present

## 2018-10-09 DIAGNOSIS — J910 Malignant pleural effusion: Secondary | ICD-10-CM | POA: Diagnosis not present

## 2018-10-09 DIAGNOSIS — I10 Essential (primary) hypertension: Secondary | ICD-10-CM | POA: Diagnosis not present

## 2018-10-09 DIAGNOSIS — I2510 Atherosclerotic heart disease of native coronary artery without angina pectoris: Secondary | ICD-10-CM | POA: Diagnosis not present

## 2018-10-09 DIAGNOSIS — J449 Chronic obstructive pulmonary disease, unspecified: Secondary | ICD-10-CM | POA: Diagnosis not present

## 2018-10-11 DIAGNOSIS — C3490 Malignant neoplasm of unspecified part of unspecified bronchus or lung: Secondary | ICD-10-CM | POA: Diagnosis not present

## 2018-10-11 DIAGNOSIS — I2510 Atherosclerotic heart disease of native coronary artery without angina pectoris: Secondary | ICD-10-CM | POA: Diagnosis not present

## 2018-10-11 DIAGNOSIS — C7951 Secondary malignant neoplasm of bone: Secondary | ICD-10-CM | POA: Diagnosis not present

## 2018-10-11 DIAGNOSIS — J449 Chronic obstructive pulmonary disease, unspecified: Secondary | ICD-10-CM | POA: Diagnosis not present

## 2018-10-11 DIAGNOSIS — J910 Malignant pleural effusion: Secondary | ICD-10-CM | POA: Diagnosis not present

## 2018-10-11 DIAGNOSIS — I10 Essential (primary) hypertension: Secondary | ICD-10-CM | POA: Diagnosis not present

## 2018-10-12 DIAGNOSIS — J449 Chronic obstructive pulmonary disease, unspecified: Secondary | ICD-10-CM | POA: Diagnosis not present

## 2018-10-12 DIAGNOSIS — C3490 Malignant neoplasm of unspecified part of unspecified bronchus or lung: Secondary | ICD-10-CM | POA: Diagnosis not present

## 2018-10-12 DIAGNOSIS — I2510 Atherosclerotic heart disease of native coronary artery without angina pectoris: Secondary | ICD-10-CM | POA: Diagnosis not present

## 2018-10-12 DIAGNOSIS — I10 Essential (primary) hypertension: Secondary | ICD-10-CM | POA: Diagnosis not present

## 2018-10-12 DIAGNOSIS — J910 Malignant pleural effusion: Secondary | ICD-10-CM | POA: Diagnosis not present

## 2018-10-12 DIAGNOSIS — C7951 Secondary malignant neoplasm of bone: Secondary | ICD-10-CM | POA: Diagnosis not present

## 2018-10-13 DIAGNOSIS — C3490 Malignant neoplasm of unspecified part of unspecified bronchus or lung: Secondary | ICD-10-CM | POA: Diagnosis not present

## 2018-10-13 DIAGNOSIS — J910 Malignant pleural effusion: Secondary | ICD-10-CM | POA: Diagnosis not present

## 2018-10-13 DIAGNOSIS — I2510 Atherosclerotic heart disease of native coronary artery without angina pectoris: Secondary | ICD-10-CM | POA: Diagnosis not present

## 2018-10-13 DIAGNOSIS — J449 Chronic obstructive pulmonary disease, unspecified: Secondary | ICD-10-CM | POA: Diagnosis not present

## 2018-10-13 DIAGNOSIS — C7951 Secondary malignant neoplasm of bone: Secondary | ICD-10-CM | POA: Diagnosis not present

## 2018-10-13 DIAGNOSIS — I10 Essential (primary) hypertension: Secondary | ICD-10-CM | POA: Diagnosis not present

## 2018-10-14 DIAGNOSIS — J910 Malignant pleural effusion: Secondary | ICD-10-CM | POA: Diagnosis not present

## 2018-10-14 DIAGNOSIS — R5381 Other malaise: Secondary | ICD-10-CM | POA: Diagnosis not present

## 2018-10-14 DIAGNOSIS — C7951 Secondary malignant neoplasm of bone: Secondary | ICD-10-CM | POA: Diagnosis not present

## 2018-10-14 DIAGNOSIS — R279 Unspecified lack of coordination: Secondary | ICD-10-CM | POA: Diagnosis not present

## 2018-10-14 DIAGNOSIS — Z743 Need for continuous supervision: Secondary | ICD-10-CM | POA: Diagnosis not present

## 2018-10-14 DIAGNOSIS — J449 Chronic obstructive pulmonary disease, unspecified: Secondary | ICD-10-CM | POA: Diagnosis not present

## 2018-10-14 DIAGNOSIS — C3490 Malignant neoplasm of unspecified part of unspecified bronchus or lung: Secondary | ICD-10-CM | POA: Diagnosis not present

## 2018-10-14 DIAGNOSIS — I2510 Atherosclerotic heart disease of native coronary artery without angina pectoris: Secondary | ICD-10-CM | POA: Diagnosis not present

## 2018-10-14 DIAGNOSIS — I10 Essential (primary) hypertension: Secondary | ICD-10-CM | POA: Diagnosis not present

## 2018-10-15 DIAGNOSIS — I10 Essential (primary) hypertension: Secondary | ICD-10-CM | POA: Diagnosis not present

## 2018-10-15 DIAGNOSIS — J910 Malignant pleural effusion: Secondary | ICD-10-CM | POA: Diagnosis not present

## 2018-10-15 DIAGNOSIS — C7951 Secondary malignant neoplasm of bone: Secondary | ICD-10-CM | POA: Diagnosis not present

## 2018-10-15 DIAGNOSIS — I2510 Atherosclerotic heart disease of native coronary artery without angina pectoris: Secondary | ICD-10-CM | POA: Diagnosis not present

## 2018-10-15 DIAGNOSIS — C3490 Malignant neoplasm of unspecified part of unspecified bronchus or lung: Secondary | ICD-10-CM | POA: Diagnosis not present

## 2018-10-15 DIAGNOSIS — J449 Chronic obstructive pulmonary disease, unspecified: Secondary | ICD-10-CM | POA: Diagnosis not present

## 2018-11-03 DEATH — deceased

## 2018-11-08 ENCOUNTER — Other Ambulatory Visit: Payer: Self-pay | Admitting: Thoracic Surgery (Cardiothoracic Vascular Surgery)

## 2018-11-08 DIAGNOSIS — J90 Pleural effusion, not elsewhere classified: Secondary | ICD-10-CM

## 2018-11-09 ENCOUNTER — Ambulatory Visit: Payer: Medicare Other | Admitting: Thoracic Surgery (Cardiothoracic Vascular Surgery)

## 2018-11-09 NOTE — Progress Notes (Unsigned)
This encounter was created in error - please disregard.
